# Patient Record
Sex: Female | Born: 1975 | Race: Black or African American | Hispanic: No | Marital: Single
Health system: Southern US, Community
[De-identification: ages and names within clinical notes are randomized; demographics above are authoritative.]

## PROBLEM LIST (undated history)

## (undated) DIAGNOSIS — I509 Heart failure, unspecified: Secondary | ICD-10-CM

## (undated) DIAGNOSIS — T783XXA Angioneurotic edema, initial encounter: Secondary | ICD-10-CM

## (undated) DIAGNOSIS — J449 Chronic obstructive pulmonary disease, unspecified: Secondary | ICD-10-CM

## (undated) DIAGNOSIS — G4730 Sleep apnea, unspecified: Secondary | ICD-10-CM

## (undated) DIAGNOSIS — E669 Obesity, unspecified: Secondary | ICD-10-CM

## (undated) DIAGNOSIS — R0600 Dyspnea, unspecified: Secondary | ICD-10-CM

## (undated) DIAGNOSIS — J40 Bronchitis, not specified as acute or chronic: Secondary | ICD-10-CM

## (undated) DIAGNOSIS — D649 Anemia, unspecified: Secondary | ICD-10-CM

## (undated) DIAGNOSIS — J45909 Unspecified asthma, uncomplicated: Secondary | ICD-10-CM

## (undated) HISTORY — PX: NO PAST SURGERIES: SHX2092

## (undated) HISTORY — DX: Angioneurotic edema, initial encounter: T78.3XXA

## (undated) HISTORY — DX: Anemia, unspecified: D64.9

## (undated) HISTORY — PX: DENTAL SURGERY: SHX609

---

## 2008-07-17 ENCOUNTER — Emergency Department (HOSPITAL_COMMUNITY): Admission: EM | Admit: 2008-07-17 | Discharge: 2008-07-17 | Payer: Self-pay | Admitting: Emergency Medicine

## 2008-12-19 ENCOUNTER — Ambulatory Visit: Payer: Self-pay | Admitting: Internal Medicine

## 2008-12-19 DIAGNOSIS — R030 Elevated blood-pressure reading, without diagnosis of hypertension: Secondary | ICD-10-CM

## 2008-12-20 ENCOUNTER — Telehealth (INDEPENDENT_AMBULATORY_CARE_PROVIDER_SITE_OTHER): Payer: Self-pay | Admitting: *Deleted

## 2008-12-20 LAB — CONVERTED CEMR LAB
ALT: 14 units/L (ref 0–35)
AST: 16 units/L (ref 0–37)
Albumin: 3.4 g/dL — ABNORMAL LOW (ref 3.5–5.2)
Alkaline Phosphatase: 92 units/L (ref 39–117)
Basophils Absolute: 0 10*3/uL (ref 0.0–0.1)
Basophils Relative: 0.2 % (ref 0.0–3.0)
Bilirubin Urine: NEGATIVE
Bilirubin, Direct: 0.1 mg/dL (ref 0.0–0.3)
CO2: 32 meq/L (ref 19–32)
Chloride: 102 meq/L (ref 96–112)
Eosinophils Absolute: 0.7 10*3/uL (ref 0.0–0.7)
Eosinophils Relative: 5.6 % — ABNORMAL HIGH (ref 0.0–5.0)
Glucose, Bld: 88 mg/dL (ref 70–99)
Hemoglobin: 11.4 g/dL — ABNORMAL LOW (ref 12.0–15.0)
Leukocytes, UA: NEGATIVE
Lymphs Abs: 2.7 10*3/uL (ref 0.7–4.0)
MCV: 77.5 fL — ABNORMAL LOW (ref 78.0–100.0)
Monocytes Absolute: 0.7 10*3/uL (ref 0.1–1.0)
Neutro Abs: 8.8 10*3/uL — ABNORMAL HIGH (ref 1.4–7.7)
Neutrophils Relative %: 67.2 % (ref 43.0–77.0)
Nitrite: NEGATIVE
RBC: 4.44 M/uL (ref 3.87–5.11)
Sodium: 140 meq/L (ref 135–145)
Total CHOL/HDL Ratio: 2
Total Protein, Urine: 30 mg/dL
VLDL: 10.4 mg/dL (ref 0.0–40.0)
WBC: 12.9 10*3/uL — ABNORMAL HIGH (ref 4.5–10.5)

## 2011-01-13 ENCOUNTER — Emergency Department (HOSPITAL_COMMUNITY)
Admission: EM | Admit: 2011-01-13 | Discharge: 2011-01-14 | Disposition: A | Discharge status: Home / Self Care | Payer: PRIVATE HEALTH INSURANCE | Attending: Emergency Medicine | Admitting: Emergency Medicine

## 2011-01-13 ENCOUNTER — Emergency Department (HOSPITAL_COMMUNITY): Payer: PRIVATE HEALTH INSURANCE

## 2011-01-13 DIAGNOSIS — R5381 Other malaise: Secondary | ICD-10-CM | POA: Insufficient documentation

## 2011-01-13 DIAGNOSIS — R0989 Other specified symptoms and signs involving the circulatory and respiratory systems: Secondary | ICD-10-CM | POA: Insufficient documentation

## 2011-01-13 DIAGNOSIS — N920 Excessive and frequent menstruation with regular cycle: Secondary | ICD-10-CM | POA: Insufficient documentation

## 2011-01-13 DIAGNOSIS — R0609 Other forms of dyspnea: Secondary | ICD-10-CM | POA: Insufficient documentation

## 2011-01-13 DIAGNOSIS — D649 Anemia, unspecified: Secondary | ICD-10-CM | POA: Insufficient documentation

## 2011-01-13 DIAGNOSIS — R079 Chest pain, unspecified: Secondary | ICD-10-CM | POA: Insufficient documentation

## 2011-01-13 LAB — COMPREHENSIVE METABOLIC PANEL
AST: 16 U/L (ref 0–37)
Albumin: 3 g/dL — ABNORMAL LOW (ref 3.5–5.2)
BUN: 9 mg/dL (ref 6–23)
Chloride: 104 mEq/L (ref 96–112)
GFR calc non Af Amer: >60 mL/min (ref >60)
Potassium: 3.6 mEq/L (ref 3.5–5.1)
Sodium: 137 mEq/L (ref 135–145)

## 2011-01-13 LAB — CBC
HCT: 21.6 % — ABNORMAL LOW (ref 36.0–46.0)
Platelets: 368 10*3/uL (ref 150–400)
RBC: 2.84 MIL/uL — ABNORMAL LOW (ref 3.87–5.11)
RDW: 18.2 % — ABNORMAL HIGH (ref 11.5–15.5)

## 2011-01-13 LAB — URINE MICROSCOPIC-ADD ON

## 2011-01-13 LAB — URINALYSIS, ROUTINE W REFLEX MICROSCOPIC
Glucose, UA: NEGATIVE mg/dL
Nitrite: NEGATIVE
Protein, ur: NEGATIVE mg/dL
Urobilinogen, UA: 1 mg/dL (ref 0.0–1.0)

## 2011-01-13 LAB — DIFFERENTIAL
Basophils Absolute: 0 10*3/uL (ref 0.0–0.1)
Eosinophils Relative: 3 % (ref 0–5)
Lymphs Abs: 3.9 10*3/uL (ref 0.7–4.0)
Monocytes Relative: 8 % (ref 3–12)
Neutro Abs: 7.8 10*3/uL — ABNORMAL HIGH (ref 1.7–7.7)

## 2011-01-13 LAB — POCT CARDIAC MARKERS
CKMB, poc: <1 ng/mL — ABNORMAL LOW (ref 1.0–8.0)
Myoglobin, poc: 39.9 ng/mL (ref 12–200)

## 2011-01-14 LAB — TSH: TSH: 1.372 u[IU]/mL (ref 0.350–4.500)

## 2011-01-25 LAB — URINE CULTURE: Culture  Setup Time: 201204190120

## 2012-11-14 ENCOUNTER — Ambulatory Visit: Payer: No Typology Code available for payment source | Admitting: Emergency Medicine

## 2012-11-14 VITALS — BP 135/78 | HR 87 | Temp 97.8°F | Resp 20 | Ht 61.5 in | Wt 397.0 lb

## 2012-11-14 DIAGNOSIS — R079 Chest pain, unspecified: Secondary | ICD-10-CM

## 2012-11-14 DIAGNOSIS — F17200 Nicotine dependence, unspecified, uncomplicated: Secondary | ICD-10-CM

## 2012-11-14 DIAGNOSIS — J209 Acute bronchitis, unspecified: Secondary | ICD-10-CM

## 2012-11-14 DIAGNOSIS — Z720 Tobacco use: Secondary | ICD-10-CM

## 2012-11-14 LAB — POCT CBC
HCT, POC: 39 % (ref 37.7–47.9)
MCHC: 29.5 g/dL — AB (ref 31.8–35.4)
MCV: 82.2 fL (ref 80–97)
MPV: 7.9 fL (ref 0–99.8)
POC LYMPH PERCENT: 28.6 %L (ref 10–50)
RBC: 4.7 M/uL (ref 4.04–5.48)
WBC: 10.7 10*3/uL — AB (ref 4.6–10.2)

## 2012-11-14 LAB — COMPREHENSIVE METABOLIC PANEL
Chloride: 103 mEq/L (ref 96–112)
Creat: 0.56 mg/dL (ref 0.50–1.10)
Glucose, Bld: 65 mg/dL — ABNORMAL LOW (ref 70–99)
Potassium: 4 mEq/L (ref 3.5–5.3)

## 2012-11-14 MED ORDER — ALBUTEROL SULFATE HFA 108 (90 BASE) MCG/ACT IN AERS: 2.0000 | INHALATION_SPRAY | RESPIRATORY_TRACT | Status: DC | PRN

## 2012-11-14 NOTE — Patient Instructions (Addendum)
Smoking Cessation Quitting smoking is important to your health and has many advantages. However, it is not always easy to quit since nicotine is a very addictive drug. Often times, people try 3 times or more before being able to quit. This document explains the best ways for you to prepare to quit smoking. Quitting takes hard work and a lot of effort, but you can do it. ADVANTAGES OF QUITTING SMOKING  You will live longer, feel better, and live better.  Your body will feel the impact of quitting smoking almost immediately.  Within 20 minutes, blood pressure decreases. Your pulse returns to its normal level.  After 8 hours, carbon monoxide levels in the blood return to normal. Your oxygen level increases.  After 24 hours, the chance of having a heart attack starts to decrease. Your breath, hair, and body stop smelling like smoke.  After 48 hours, damaged nerve endings begin to recover. Your sense of taste and smell improve.  After 72 hours, the body is virtually free of nicotine. Your bronchial tubes relax and breathing becomes easier.  After 2 to 12 weeks, lungs can hold more air. Exercise becomes easier and circulation improves.  The risk of having a heart attack, stroke, cancer, or lung disease is greatly reduced.  After 1 year, the risk of coronary heart disease is cut in half.  After 5 years, the risk of stroke falls to the same as a nonsmoker.  After 10 years, the risk of lung cancer is cut in half and the risk of other cancers decreases significantly.  After 15 years, the risk of coronary heart disease drops, usually to the level of a nonsmoker.  If you are pregnant, quitting smoking will improve your chances of having a healthy baby.  The people you live with, especially any children, will be healthier.  You will have extra money to spend on things other than cigarettes. QUESTIONS TO THINK ABOUT BEFORE ATTEMPTING TO QUIT You may want to talk about your answers with your  caregiver.  Why do you want to quit?  If you tried to quit in the past, what helped and what did not?  What will be the most difficult situations for you after you quit? How will you plan to handle them?  Who can help you through the tough times? Your family? Friends? A caregiver?  What pleasures do you get from smoking? What ways can you still get pleasure if you quit? Here are some questions to ask your caregiver:  How can you help me to be successful at quitting?  What medicine do you think would be best for me and how should I take it?  What should I do if I need more help?  What is smoking withdrawal like? How can I get information on withdrawal? GET READY  Set a quit date.  Change your environment by getting rid of all cigarettes, ashtrays, matches, and lighters in your home, car, or work. Do not let people smoke in your home.  Review your past attempts to quit. Think about what worked and what did not. GET SUPPORT AND ENCOURAGEMENT You have a better chance of being successful if you have help. You can get support in many ways.  Tell your family, friends, and co-workers that you are going to quit and need their support. Ask them not to smoke around you.  Get individual, group, or telephone counseling and support. Programs are available at local hospitals and health centers. Call your local health department for   information about programs in your area.  Spiritual beliefs and practices may help some smokers quit.  Download a "quit meter" on your computer to keep track of quit statistics, such as how long you have gone without smoking, cigarettes not smoked, and money saved.  Get a self-help book about quitting smoking and staying off of tobacco. LEARN NEW SKILLS AND BEHAVIORS  Distract yourself from urges to smoke. Talk to someone, go for a walk, or occupy your time with a task.  Change your normal routine. Take a different route to work. Drink tea instead of coffee.  Eat breakfast in a different place.  Reduce your stress. Take a hot bath, exercise, or read a book.  Plan something enjoyable to do every day. Reward yourself for not smoking.  Explore interactive web-based programs that specialize in helping you quit. GET MEDICINE AND USE IT CORRECTLY Medicines can help you stop smoking and decrease the urge to smoke. Combining medicine with the above behavioral methods and support can greatly increase your chances of successfully quitting smoking.  Nicotine replacement therapy helps deliver nicotine to your body without the negative effects and risks of smoking. Nicotine replacement therapy includes nicotine gum, lozenges, inhalers, nasal sprays, and skin patches. Some may be available over-the-counter and others require a prescription.  Antidepressant medicine helps people abstain from smoking, but how this works is unknown. This medicine is available by prescription.  Nicotinic receptor partial agonist medicine simulates the effect of nicotine in your brain. This medicine is available by prescription. Ask your caregiver for advice about which medicines to use and how to use them based on your health history. Your caregiver will tell you what side effects to look out for if you choose to be on a medicine or therapy. Carefully read the information on the package. Do not use any other product containing nicotine while using a nicotine replacement product.  RELAPSE OR DIFFICULT SITUATIONS Most relapses occur within the first 3 months after quitting. Do not be discouraged if you start smoking again. Remember, most people try several times before finally quitting. You may have symptoms of withdrawal because your body is used to nicotine. You may crave cigarettes, be irritable, feel very hungry, cough often, get headaches, or have difficulty concentrating. The withdrawal symptoms are only temporary. They are strongest when you first quit, but they will go away within  10 14 days. To reduce the chances of relapse, try to:  Avoid drinking alcohol. Drinking lowers your chances of successfully quitting.  Reduce the amount of caffeine you consume. Once you quit smoking, the amount of caffeine in your body increases and can give you symptoms, such as a rapid heartbeat, sweating, and anxiety.  Avoid smokers because they can make you want to smoke.  Do not let weight gain distract you. Many smokers will gain weight when they quit, usually less than 10 pounds. Eat a healthy diet and stay active. You can always lose the weight gained after you quit.  Find ways to improve your mood other than smoking. FOR MORE INFORMATION  www.smokefree.gov  Document Released: 09/07/2001 Document Revised: 03/14/2012 Document Reviewed: 12/23/2011 [employer] Patient Information 2013 [employer].  

## 2012-11-14 NOTE — Progress Notes (Signed)
[facility] [address], [place] [address] [phone]  Date:  11/14/2012   Name:  April Ellis   DOB:  06/07/1976   MRN:  [id number]  PCP:  Oliver Barre, MD    Chief Complaint: Chest Pain, Shortness of Breath and Labs Only d   History of Present Illness:  April Ellis is a 37 y.o. very pleasant female patient who presents with the following:  developed chest tightness last night while at rest. Tightness persisted through the night and less now but continuous.   No tachycardia or palpitations.  No  shortness of breath.  No nausea or vomiting.  Pain not radiating.  No diaphoresis.  Smokes.  No history of prior chest pain.  No family history of premature cardiac disease. Felt last night as though she was coming down with a "cold" with rhinorrhea and a non productive cough.  Some wheezing.  No fever or chills.    Patient Active Problem List  Diagnosis  . MORBID OBESITY  . ELEVATED BLOOD PRESSURE WITHOUT DIAGNOSIS OF HYPERTENSION    Past Medical History  Diagnosis Date  . Anemia     History reviewed. No pertinent past surgical history.  History  Substance Use Topics  . Smoking status: Current Every Day Smoker  . Smokeless tobacco: Not on file  . Alcohol Use: No    Family History  Problem Relation Age of Onset  . Diabetes Mother   . Diabetes Sister   . Hypertension Sister   . Hypertension Sister     No Known Allergies  Medication list has been reviewed and updated.  No current outpatient prescriptions on file prior to visit.   No current facility-administered medications on file prior to visit.    Review of Systems:  As per HPI, otherwise negative.    Physical Examination: Filed Vitals:   11/14/12 1603  BP: 135/78  Pulse: 87  Temp: 97.8 F (36.6 C)  Resp: 20   Filed Vitals:   11/14/12 1603  Height: 5' 1.5" (1.562 m)  Weight: 397 lb (180.078 kg)   Body mass index is 73.81 kg/(m^2). Ideal Body Weight: Weight in (lb) to  have BMI = 25: 134.2  GEN: morbidly obese, NAD, Non-toxic, A & O x 3 HEENT: Atraumatic, Normocephalic. Neck supple. No masses, No LAD. Ears and Nose: No external deformity. CV: RRR, No M/G/R. No JVD. No thrill. No extra heart sounds. PULM: CTA B, scattered wheezes, no crackles, rhonchi. No retractions. No resp. distress. No accessory muscle use. ABD: S, NT, ND, +BS. No rebound. No HSM. EXTR: No c/c/e  No calf tenderness NEURO Normal gait.  PSYCH: Normally interactive. Conversant. Not depressed or anxious appearing.  Calm demeanor.    Assessment and Plan: Chest pain Acute URI Bronchitis with Bronchospasm Albuterol MDI Counseled to lose weight and stop smoking.  Carmelina Dane, MD

## 2012-11-15 LAB — TROPONIN I: Troponin I: <0.01 ng/mL (ref <0.06)

## 2012-12-08 ENCOUNTER — Emergency Department (HOSPITAL_COMMUNITY)
Admission: EM | Admit: 2012-12-08 | Discharge: 2012-12-08 | Disposition: A | Discharge status: Home / Self Care | Payer: No Typology Code available for payment source | Attending: Emergency Medicine | Admitting: Emergency Medicine

## 2012-12-08 ENCOUNTER — Emergency Department (HOSPITAL_COMMUNITY): Payer: No Typology Code available for payment source

## 2012-12-08 ENCOUNTER — Encounter (HOSPITAL_COMMUNITY): Payer: Self-pay | Admitting: Emergency Medicine

## 2012-12-08 DIAGNOSIS — R0602 Shortness of breath: Secondary | ICD-10-CM | POA: Insufficient documentation

## 2012-12-08 DIAGNOSIS — IMO0002 Reserved for concepts with insufficient information to code with codable children: Secondary | ICD-10-CM | POA: Insufficient documentation

## 2012-12-08 DIAGNOSIS — Z79899 Other long term (current) drug therapy: Secondary | ICD-10-CM | POA: Insufficient documentation

## 2012-12-08 DIAGNOSIS — R059 Cough, unspecified: Secondary | ICD-10-CM | POA: Insufficient documentation

## 2012-12-08 DIAGNOSIS — J45901 Unspecified asthma with (acute) exacerbation: Secondary | ICD-10-CM | POA: Insufficient documentation

## 2012-12-08 DIAGNOSIS — F17200 Nicotine dependence, unspecified, uncomplicated: Secondary | ICD-10-CM | POA: Insufficient documentation

## 2012-12-08 DIAGNOSIS — R0609 Other forms of dyspnea: Secondary | ICD-10-CM | POA: Insufficient documentation

## 2012-12-08 DIAGNOSIS — Z862 Personal history of diseases of the blood and blood-forming organs and certain disorders involving the immune mechanism: Secondary | ICD-10-CM | POA: Insufficient documentation

## 2012-12-08 DIAGNOSIS — R0989 Other specified symptoms and signs involving the circulatory and respiratory systems: Secondary | ICD-10-CM | POA: Insufficient documentation

## 2012-12-08 HISTORY — DX: Bronchitis, not specified as acute or chronic: J40

## 2012-12-08 MED ORDER — ALBUTEROL SULFATE HFA 108 (90 BASE) MCG/ACT IN AERS
2.0000 | INHALATION_SPRAY | RESPIRATORY_TRACT | Status: DC | PRN
  Administered 2012-12-08: 2 via RESPIRATORY_TRACT
  Filled 2012-12-08: qty 6.7

## 2012-12-08 MED ORDER — IPRATROPIUM BROMIDE 0.02 % IN SOLN
0.5000 mg | Freq: Once | RESPIRATORY_TRACT | Status: AC
  Administered 2012-12-08: 0.5 mg via RESPIRATORY_TRACT
  Filled 2012-12-08: qty 2.5

## 2012-12-08 MED ORDER — DEXAMETHASONE SODIUM PHOSPHATE 10 MG/ML IJ SOLN
10.0000 mg | Freq: Once | INTRAMUSCULAR | Status: AC
  Administered 2012-12-08: 10 mg via INTRAMUSCULAR
  Filled 2012-12-08: qty 1

## 2012-12-08 MED ORDER — PREDNISONE 10 MG PO TABS: 50.0000 mg | ORAL_TABLET | Freq: Every day | ORAL | Status: DC

## 2012-12-08 MED ORDER — ALBUTEROL SULFATE (5 MG/ML) 0.5% IN NEBU
5.0000 mg | INHALATION_SOLUTION | Freq: Once | RESPIRATORY_TRACT | Status: AC
  Administered 2012-12-08: 5 mg via RESPIRATORY_TRACT
  Filled 2012-12-08: qty 1

## 2012-12-08 MED ORDER — DEXAMETHASONE SODIUM PHOSPHATE 10 MG/ML IJ SOLN: 10.0000 mg | Freq: Once | INTRAMUSCULAR | Status: DC

## 2012-12-08 NOTE — ED Provider Notes (Signed)
History     CSN: [id number]  Arrival date & time 12/08/12  0804   First MD Initiated Contact with Patient 12/08/12 0820      Chief Complaint  Patient presents with  . Nasal Congestion     HPI Pt presenting to ed with c/o cough, congestion and shortness of breath x couple of days. Pt states positive shortness of breath. Pt denies chest pain, nausea, and vomiting at this time. Pt states her inhalers are not working. Pt with audible wheezing noted  Past Medical History  Diagnosis Date  . Anemia   . Bronchitis     History reviewed. No pertinent past surgical history.  Family History  Problem Relation Age of Onset  . Diabetes Mother   . Diabetes Sister   . Hypertension Sister   . Hypertension Sister     History  Substance Use Topics  . Smoking status: Current Every Day Smoker  . Smokeless tobacco: Not on file  . Alcohol Use: No    OB History   Grav Para Term Preterm Abortions TAB SAB Ect Mult Living                  Review of Systems All other systems reviewed and are negative Allergies  Review of patient's allergies indicates no known allergies.  Home Medications   Current Outpatient Rx  Name  Route  Sig  Dispense  Refill  . albuterol (PROVENTIL HFA;VENTOLIN HFA) 108 (90 BASE) MCG/ACT inhaler   Inhalation   Inhale 2 puffs into the lungs every 4 (four) hours as needed for wheezing (cough, shortness of breath or wheezing.).         Marland Kitchen aspirin 325 MG tablet   Oral   Take 325-650 mg by mouth every 4 (four) hours as needed for pain.         . predniSONE (DELTASONE) 10 MG tablet   Oral   Take 5 tablets (50 mg total) by mouth daily. Pill prescription and begin taking of no improvement in 24 hours   15 tablet   0     BP 176/94  Pulse 101  Temp(Src) 98.7 F (37.1 C) (Oral)  Resp 20  SpO2 95%  LMP 11/11/2012  Physical Exam  Nursing note and vitals reviewed. Constitutional: She is oriented to person, place, and time. She appears well-developed and  well-nourished. No distress.  HENT:  Head: Normocephalic and atraumatic.  Eyes: Pupils are equal, round, and reactive to light.  Neck: Normal range of motion.  Cardiovascular: Normal rate and intact distal pulses.   Pulmonary/Chest: She is in respiratory distress (Mild). She has wheezes. She has no rales. She exhibits no tenderness.  Abdominal: Normal appearance. She exhibits no distension. There is no tenderness.  Musculoskeletal: Normal range of motion.  Neurological: She is alert and oriented to person, place, and time. No cranial nerve deficit.  Skin: Skin is warm and dry. No rash noted.  Psychiatric: She has a normal mood and affect. Her behavior is normal.    ED Course  Procedures (including critical care time) Meds ordered this encounter  Medications  . albuterol (PROVENTIL) (5 MG/ML) 0.5% nebulizer solution 5 mg    Sig:   . ipratropium (ATROVENT) nebulizer solution 0.5 mg    Sig:   . DISCONTD: dexamethasone (DECADRON) injection 10 mg    Sig:   . aspirin 325 MG tablet    Sig: Take 325-650 mg by mouth every 4 (four) hours as needed for pain.  Marland Kitchen albuterol (  PROVENTIL HFA;VENTOLIN HFA) 108 (90 BASE) MCG/ACT inhaler    Sig: Inhale 2 puffs into the lungs every 4 (four) hours as needed for wheezing (cough, shortness of breath or wheezing.).  Marland Kitchen dexamethasone (DECADRON) injection 10 mg    Sig:   . albuterol (PROVENTIL) (5 MG/ML) 0.5% nebulizer solution 5 mg    Sig:   . ipratropium (ATROVENT) nebulizer solution 0.5 mg    Sig:   . predniSONE (DELTASONE) 10 MG tablet    Sig: Take 5 tablets (50 mg total) by mouth daily. Pill prescription and begin taking of no improvement in 24 hours    Dispense:  15 tablet    Refill:  0  . albuterol (PROVENTIL HFA;VENTOLIN HFA) 108 (90 BASE) MCG/ACT inhaler 2 puff    Sig:     Labs Reviewed - No data to display Dg Chest 2 View (if Patient Has Fever And/or Copd)  12/08/2012  *RADIOLOGY REPORT*  Clinical Data: Shortness of breath.  Cough.  Tobacco  use.  CHEST - 2 VIEW  Comparison: None.  Findings: Mild cardiomegaly noted along with mild airway thickening.  The lungs appear otherwise clear.  No pleural effusion observed.  IMPRESSION:  1.  Mild cardiomegaly. 2. Airway thickening may reflect bronchitis or reactive airways disease.  No airspace opacity is identified to suggest bacterial pneumonia pattern.   Original Report Authenticated By: Gaylyn Rong, M.D.      1. Asthmatic bronchitis       MDM  Patient is feeling better after treatment and doesn't want to be admitted.  Her O2 saturations are in the mid to low 90s as long as she is at rest.  Patient stable for discharge stay she feels better.        Nelia Shi, MD 12/08/12 [phone]

## 2012-12-08 NOTE — ED Notes (Signed)
Patient transported to X-ray 

## 2012-12-08 NOTE — ED Notes (Signed)
Pt amputated oxygen 82%, heart rate 117 nurse aware

## 2012-12-08 NOTE — ED Notes (Signed)
Pt presenting to ed with c/o cough, congestion and shortness of breath x couple of days. Pt states positive shortness of breath. Pt denies chest pain, nausea, and vomiting at this time. Pt states her inhalers are not working. Pt with audible wheezing noted

## 2013-09-27 LAB — HM PAP SMEAR: HM PAP: NORMAL

## 2014-03-04 ENCOUNTER — Ambulatory Visit (INDEPENDENT_AMBULATORY_CARE_PROVIDER_SITE_OTHER): Payer: No Typology Code available for payment source | Admitting: Emergency Medicine

## 2014-03-04 VITALS — BP 124/74 | HR 103 | Temp 97.9°F | Resp 20 | Ht 62.0 in | Wt 356.0 lb

## 2014-03-04 DIAGNOSIS — R0602 Shortness of breath: Secondary | ICD-10-CM

## 2014-03-04 DIAGNOSIS — J209 Acute bronchitis, unspecified: Secondary | ICD-10-CM

## 2014-03-04 MED ORDER — ALBUTEROL SULFATE (2.5 MG/3ML) 0.083% IN NEBU
5.0000 mg | INHALATION_SOLUTION | Freq: Once | RESPIRATORY_TRACT | Status: AC
  Administered 2014-03-04: 5 mg via RESPIRATORY_TRACT

## 2014-03-04 MED ORDER — ALBUTEROL SULFATE HFA 108 (90 BASE) MCG/ACT IN AERS: 2.0000 | INHALATION_SPRAY | RESPIRATORY_TRACT | Status: DC | PRN

## 2014-03-04 MED ORDER — IPRATROPIUM BROMIDE 0.02 % IN SOLN
0.5000 mg | Freq: Once | RESPIRATORY_TRACT | Status: AC
  Administered 2014-03-04: 0.5 mg via RESPIRATORY_TRACT

## 2014-03-04 NOTE — Patient Instructions (Signed)
Metered Dose Inhaler (No Spacer Used) Inhaled medicines are the basis of asthma treatment and other breathing problems. Inhaled medicine can only be effective if used properly. Good technique assures that the medicine reaches the lungs. Metered dose inhalers (MDIs) are used to deliver a variety of inhaled medicines. These include quick relief or rescue medicines (such as bronchodilators) and controller medicines (such as corticosteroids). The medicine is delivered by pushing down on a metal canister to release a set amount of spray. If you are using different kinds of inhalers, use your quick relief medicine to open the airways 10 15 minutes before using a steroid if instructed to do so by your health care provider. If you are unsure which inhalers to use and the order of using them, ask your health care provider, nurse, or respiratory therapist. HOW TO USE THE INHALER 1. Remove cap from inhaler. 2. If you are using the inhaler for the first time, you will need to prime it. Shake the inhaler for 5 seconds and release four puffs into the air, away from your face. Ask your health care provider or pharmacist if you have questions about priming your inhaler. 3. Shake inhaler for 5 seconds before each breath in (inhalation). 4. Position the inhaler so that the top of the canister faces up. 5. Put your index finger on the top of the medicine canister. Your thumb supports the bottom of the inhaler. 6. Open your mouth. 7. Either place the inhaler between your teeth and place your lips tightly around the mouthpiece, or hold the inhaler 1 2 inches away from your open mouth. If you are unsure of which technique to use, ask your health care provider. 8. Breathe out (exhale) normally and as completely as possible. 9. Press the canister down with the index finger to release the medicine. 10. At the same time as the canister is pressed, inhale deeply and slowly until the lungs are completely filled. This should take  4 6 seconds. Keep your tongue down. 11. Hold the medicine in your lungs for up to 5 10 seconds (10 seconds is best). This helps the medicine get into the small airways of your lungs. 12. Breathe out slowly, through pursed lips. Whistling is an example of pursed lips. 13. Wait at least 1 minute between puffs. Continue with the above steps until you have taken the number of puffs your health care provider has ordered. Do not use the inhaler more than your health care provider directs you to. 14. Replace cap on inhaler. 15. Follow the directions from your health care provider or the inhaler insert for cleaning the inhaler. If you are using a steroid inhaler, rinse your mouth with water after your last puff, gargle, and spit out the water. Do not swallow the water. AVOID:  Inhaling before or after starting the spray of medicine. It takes practice to coordinate your breathing with triggering the spray.  Inhaling through the nose (rather than the mouth) when triggering the spray. HOW TO DETERMINE IF YOUR INHALER IS FULL OR NEARLY EMPTY You cannot know when an inhaler is empty by shaking it. A few inhalers are now being made with dose counters. Ask your health care provider for a prescription that has a dose counter if you feel you need that extra help. If your inhaler does not have a counter, ask your health care provider to help you determine the date you need to refill your inhaler. Write the refill date on a calendar or your inhaler canister.  Refill your inhaler 7 10 days before it runs out. Be sure to keep an adequate supply of medicine. This includes making sure it is not expired, and you have a spare inhaler.  SEEK MEDICAL CARE IF:   Symptoms are only partially relieved with your inhaler.  You are having trouble using your inhaler.  You experience some increase in phlegm. SEEK IMMEDIATE MEDICAL CARE IF:   You feel little or no relief with your inhalers. You are still wheezing and are feeling  shortness of breath or tightness in your chest or both.  You have dizziness, headaches, or fast heart rate.  You have chills, fever, or night sweats.  There is a noticeable increase in phlegm production, or there is blood in the phlegm. Document Released: 07/11/2007 Document Revised: 05/16/2013 Document Reviewed: 03/01/2013 [facility] Patient Information 2014 [place], [place]. Bronchospasm, Adult A bronchospasm is a spasm or tightening of the airways going into the lungs. During a bronchospasm breathing becomes more difficult because the airways get smaller. When this happens there can be coughing, a whistling sound when breathing (wheezing), and difficulty breathing. Bronchospasm is often associated with asthma, but not all patients who experience a bronchospasm have asthma. CAUSES  A bronchospasm is caused by inflammation or irritation of the airways. The inflammation or irritation may be triggered by:   Allergies (such as to animals, pollen, food, or mold). Allergens that cause bronchospasm may cause wheezing immediately after exposure or many hours later.   Infection. Viral infections are believed to be the most common cause of bronchospasm.   Exercise.   Irritants (such as pollution, cigarette smoke, strong odors, aerosol sprays, and paint fumes).   Weather changes. Winds increase molds and pollens in the air. Rain refreshes the air by washing irritants out. Cold air may cause inflammation.   Stress and emotional upset.  SIGNS AND SYMPTOMS   Wheezing.   Excessive nighttime coughing.   Frequent or severe coughing with a simple cold.   Chest tightness.   Shortness of breath.  DIAGNOSIS  Bronchospasm is usually diagnosed through a history and physical exam. Tests, such as chest X-rays, are sometimes done to look for other conditions. TREATMENT   Inhaled medicines can be given to open up your airways and help you breathe. The medicines can be given using either an  inhaler or a nebulizer machine.  Corticosteroid medicines may be given for severe bronchospasm, usually when it is associated with asthma. HOME CARE INSTRUCTIONS   Always have a plan prepared for seeking medical care. Know when to call your health care provider and local emergency services (911 in the [place] Know where you can access local emergency care.  Only take medicines as directed by your health care provider.  If you were prescribed an inhaler or nebulizer machine, ask your health care provider to explain how to use it correctly. Always use a spacer with your inhaler if you were given one.  It is necessary to remain calm during an attack. Try to relax and breathe more slowly.  Control your home environment in the following ways:   Change your heating and air conditioning filter at least once a month.   Limit your use of fireplaces and wood stoves.  Do not smoke and do not allow smoking in your home.   Avoid exposure to perfumes and fragrances.   Get rid of pests (such as roaches and mice) and their droppings.   Throw away plants if you see mold on them.   Keep  your house clean and dust free.   Replace carpet with wood, tile, or vinyl flooring. Carpet can trap dander and dust.   Use allergy-proof pillows, mattress covers, and box spring covers.   Wash bed sheets and blankets every week in hot water and dry them in a dryer.   Use blankets that are made of polyester or cotton.   Wash hands frequently. SEEK MEDICAL CARE IF:   You have muscle aches.   You have chest pain.   The sputum changes from clear or white to yellow, green, gray, or bloody.   The sputum you cough up gets thicker.   There are problems that may be related to the medicine you are given, such as a rash, itching, swelling, or trouble breathing.  SEEK IMMEDIATE MEDICAL CARE IF:   You have worsening wheezing and coughing even after taking your prescribed medicines.   You have  increased difficulty breathing.   You develop severe chest pain. MAKE SURE YOU:   Understand these instructions.  Will watch your condition.  Will get help right away if you are not doing well or get worse. Document Released: 09/16/2003 Document Revised: 05/16/2013 Document Reviewed: 03/05/2013 [facility] Patient Information 2014 [place].

## 2014-03-04 NOTE — Progress Notes (Signed)
[facility] [address] [place] [address] [phone]  Date:  03/04/2014   Name:  April Ellis   DOB:  08-06-1976   MRN:  [id number]  PCP:  [name], MD    Chief Complaint: Cough and Shortness of Breath   History of Present Illness:  April Ellis is a 38 y.o. very pleasant female patient who presents with the following:  Patient with history of bronchitis.  Now has shortness of breath that has been increasing.  Cough that is productive of scant mucoid sputum.  No wheezing with exertional shortness of breath. No fever or chills.  No rash, stool change, nausea or vomiting. Has used nebs and MDI previously for bronchitis No improvement with over the counter medications or other home remedies. Denies other complaint or health concern today.   Patient Active Problem List   Diagnosis Date Noted  . MORBID OBESITY 12/19/2008  . ELEVATED BLOOD PRESSURE WITHOUT DIAGNOSIS OF HYPERTENSION 12/19/2008    Past Medical History  Diagnosis Date  . Anemia   . Bronchitis     No past surgical history on file.  History  Substance Use Topics  . Smoking status: Current Every Day Smoker  . Smokeless tobacco: Not on file  . Alcohol Use: No    Family History  Problem Relation Age of Onset  . Diabetes Mother   . Diabetes Sister   . Hypertension Sister   . Hypertension Sister     No Known Allergies  Medication list has been reviewed and updated.  No current outpatient prescriptions on file prior to visit.   No current facility-administered medications on file prior to visit.    Review of Systems:  As per HPI, otherwise negative.    Physical Examination: Filed Vitals:   03/04/14 1957  BP: 124/74  Pulse: 103  Temp: 97.9 F (36.6 C)  Resp: 20   Filed Vitals:   03/04/14 1957  Height: 5\' 2"  (1.575 m)  Weight: 356 lb (161.481 kg)   Body mass index is 65.1 kg/(m^2). Ideal Body Weight: Weight in (lb) to have BMI = 25: 136.4  GEN: morbid  obesity, NAD, Non-toxic, A & O x 3 HEENT: Atraumatic, Normocephalic. Neck supple. No masses, No LAD. Ears and Nose: No external deformity. CV: RRR, No M/G/R. No JVD. No thrill. No extra heart sounds. PULM: poor air movement Bilateral wheezes, crackles, rhonchi. No retractions. No resp. distress. No accessory muscle use. ABD: S, NT, ND, +BS. No rebound. No HSM. EXTR: No c/c/e NEURO Normal gait.  PSYCH: Normally interactive. Conversant. Not depressed or anxious appearing.  Calm demeanor.    Assessment and Plan: Migraine headache Phenergan Nubain   Signed,  Ellison Carwin, MD

## 2014-03-06 ENCOUNTER — Other Ambulatory Visit: Payer: Self-pay

## 2014-03-06 ENCOUNTER — Telehealth: Payer: Self-pay

## 2014-03-06 ENCOUNTER — Ambulatory Visit (INDEPENDENT_AMBULATORY_CARE_PROVIDER_SITE_OTHER): Payer: No Typology Code available for payment source

## 2014-03-06 ENCOUNTER — Ambulatory Visit (INDEPENDENT_AMBULATORY_CARE_PROVIDER_SITE_OTHER): Payer: No Typology Code available for payment source | Admitting: Family Medicine

## 2014-03-06 VITALS — BP 116/77 | HR 96 | Temp 98.3°F | Resp 20 | Ht 62.0 in | Wt 356.0 lb

## 2014-03-06 DIAGNOSIS — R0602 Shortness of breath: Secondary | ICD-10-CM

## 2014-03-06 DIAGNOSIS — R0902 Hypoxemia: Secondary | ICD-10-CM

## 2014-03-06 DIAGNOSIS — J45901 Unspecified asthma with (acute) exacerbation: Secondary | ICD-10-CM

## 2014-03-06 DIAGNOSIS — F17200 Nicotine dependence, unspecified, uncomplicated: Secondary | ICD-10-CM

## 2014-03-06 DIAGNOSIS — J441 Chronic obstructive pulmonary disease with (acute) exacerbation: Secondary | ICD-10-CM

## 2014-03-06 LAB — POCT CBC
Granulocyte percent: 73.4 %G (ref 37–80)
HCT, POC: 33.9 % — AB (ref 37.7–47.9)
Hemoglobin: 9.5 g/dL — AB (ref 12.2–16.2)
LYMPH, POC: 2 (ref 0.6–3.4)
MCH: 19.2 pg — AB (ref 27–31.2)
MCHC: 28 g/dL — AB (ref 31.8–35.4)
MCV: 68.3 fL — AB (ref 80–97)
MID (CBC): 1.1 — AB (ref 0–0.9)
MPV: 7.9 fL (ref 0–99.8)
PLATELET COUNT, POC: 418 10*3/uL (ref 142–424)
POC Granulocyte: 8.4 — AB (ref 2–6.9)
POC LYMPH %: 17.1 % (ref 10–50)
POC MID %: 9.5 % (ref 0–12)
RBC: 4.96 M/uL (ref 4.04–5.48)
RDW, POC: 22.1 %
WBC: 11.5 10*3/uL — AB (ref 4.6–10.2)

## 2014-03-06 LAB — GLUCOSE, POCT (MANUAL RESULT ENTRY): POC Glucose: 91 mg/dl (ref 70–99)

## 2014-03-06 MED ORDER — ALBUTEROL SULFATE (2.5 MG/3ML) 0.083% IN NEBU: 2.5000 mg | INHALATION_SOLUTION | Freq: Four times a day (QID) | RESPIRATORY_TRACT | Status: DC | PRN

## 2014-03-06 MED ORDER — ALBUTEROL SULFATE HFA 108 (90 BASE) MCG/ACT IN AERS: 2.0000 | INHALATION_SPRAY | Freq: Four times a day (QID) | RESPIRATORY_TRACT | Status: DC | PRN

## 2014-03-06 MED ORDER — IPRATROPIUM BROMIDE 0.02 % IN SOLN
0.5000 mg | Freq: Once | RESPIRATORY_TRACT | Status: AC
  Administered 2014-03-06: 0.5 mg via RESPIRATORY_TRACT

## 2014-03-06 MED ORDER — ALBUTEROL SULFATE (2.5 MG/3ML) 0.083% IN NEBU
2.5000 mg | INHALATION_SOLUTION | Freq: Once | RESPIRATORY_TRACT | Status: AC
  Administered 2014-03-06: 2.5 mg via RESPIRATORY_TRACT

## 2014-03-06 MED ORDER — PREDNISONE 20 MG PO TABS: ORAL_TABLET | ORAL | Status: DC

## 2014-03-06 MED ORDER — BECLOMETHASONE DIPROPIONATE 80 MCG/ACT IN AERS: 2.0000 | INHALATION_SPRAY | Freq: Two times a day (BID) | RESPIRATORY_TRACT | Status: DC

## 2014-03-06 MED ORDER — METHYLPREDNISOLONE SODIUM SUCC 125 MG IJ SOLR
125.0000 mg | Freq: Once | INTRAMUSCULAR | Status: AC
  Administered 2014-03-06: 125 mg via INTRAMUSCULAR

## 2014-03-06 NOTE — Progress Notes (Signed)
Subjective: I was asked to see this patient acutely. She was here 2 days ago with some breathing difficulty. She has been using her albuterol inhaler sometimes 4 puffs every 2 hours or so at home. She has been feeling more short of breath. She has very little exertional capability. She continues to smoke some cigarettes that she is cutting back. A pack lasts about 2 days. She's not coughing up any phlegm. She is not running a fever. She is not having any chest pains or palpitations. She apparently works a job. She cannot ambulate without her oxygen dropping low.  Objective: Morbidly obese lady, wheezing. Her chest has prolonged expiratory phase and wheezing bilaterally. Throat was clear. Neck supple without nodes. Heart regular without murmurs gallops or arrhythmias. No edema. As noted she is very very obese. Pulse ox is 91 or 92 at rest, but walking down the hall to the exam room just 20 feet and her oxygen level was 87.  Nebulizer treatment of albuterol and Atrovent was given her and her O2 saturation was around 93.  [facility] reading (PRIMARY) by  Dr. Linna Darner Normal chest  Peak flow could not be registered initially. After the nebulized treatment it was about 140.  Repeat exam of the chest revealed only minimal end expiratory wheezing. She felt much better. She was slowing time.  She is given another nebulizer treatment with just plain albuterol. She was feeling better though the peak flow not substantially improved. Minimal end expiratory wheeze still. She states she feels much better.  Assessment: Asthma exacerbation COPD exacerbation Tobacco use disorder Morbid obesity Hypoxia, much worse on minimal exertion  Plan: The nebulizer machine. I believe that she is moving so little air that she gets practically none in from her inhaler. Injection of Solu-Medrol 125 and prednisone taper at home Go to the emergency room if in all worse Spent about 45 min of my time but patient treated over the  span of 90 minutes.   Return in 3 days, referral is being made to pulmonologist. Informed her that if she doesn't stop smoking this could kill her.

## 2014-03-06 NOTE — Telephone Encounter (Signed)
Pt called and stated we had sent her albuterol and pred Rxs to [facility] and should have gone to [facility]. I resent them to [facility] and cancelled at [facility].

## 2014-03-06 NOTE — Patient Instructions (Addendum)
Use the albuterol nebulizer every 4-6 hours as needed. Use either it or the inhaler, not both at the same time.  Beginning tomorrow morning take the prednisone 3 tablets daily for 2 days, then daily for 2 days, then one daily for 2 days  Stop smoking immediately  Referral is being made to a lung specialist. If you do not hear from our office within the next week regarding that referral, please call back and speak to the referral desk.  Return in 3 days for a recheck. Return sooner if any time if worse. If abruptly worse come to the emergency room.  Use the Qvar inhaler 2 inhalations twice daily

## 2014-03-06 NOTE — Telephone Encounter (Signed)
Pt called. Said we hand wrote a prescription for a nebulizer machine, but we didn't give her a prescription for the albuterol nebules. Dr. Linna Darner, do you mind printing a rx for these and I'll fax it to the number below. Thanks   [facility] Fax #: [phone]

## 2014-03-06 NOTE — Addendum Note (Signed)
Addended by: Karlena Luebke H on: 03/06/2014 03:47 PM   Modules accepted: Orders

## 2014-03-07 NOTE — Telephone Encounter (Signed)
Nebules sent to pharm .

## 2014-03-09 ENCOUNTER — Ambulatory Visit (INDEPENDENT_AMBULATORY_CARE_PROVIDER_SITE_OTHER): Payer: No Typology Code available for payment source | Admitting: Family Medicine

## 2014-03-09 VITALS — BP 130/68 | HR 81 | Temp 97.8°F | Resp 15 | Ht 61.5 in | Wt 349.0 lb

## 2014-03-09 DIAGNOSIS — J45909 Unspecified asthma, uncomplicated: Secondary | ICD-10-CM

## 2014-03-09 DIAGNOSIS — J441 Chronic obstructive pulmonary disease with (acute) exacerbation: Secondary | ICD-10-CM | POA: Diagnosis not present

## 2014-03-09 MED ORDER — IPRATROPIUM BROMIDE 0.02 % IN SOLN
0.5000 mg | Freq: Once | RESPIRATORY_TRACT | Status: AC
  Administered 2014-03-09: 0.5 mg via RESPIRATORY_TRACT

## 2014-03-09 MED ORDER — ALBUTEROL SULFATE (2.5 MG/3ML) 0.083% IN NEBU
2.5000 mg | INHALATION_SOLUTION | Freq: Once | RESPIRATORY_TRACT | Status: AC
  Administered 2014-03-09: 2.5 mg via RESPIRATORY_TRACT

## 2014-03-09 NOTE — Patient Instructions (Signed)
Continue to drink plenty of fluids.  Work on doing. Deep breathing and cough periodically to keep your lungs clear.  Continue the same medications  If you do not hear from the pulmonary referral by Tuesday please call back and speak to the referrals desk here.  Call 911 at anytime in the event of major difficulty breathing

## 2014-03-09 NOTE — Progress Notes (Signed)
Subjective: Patient is here for a recheck. She feels like she's been doing better. She is taking the prednisone still. She has been using her nebulizer at home. She's not been smoking.  Objective: She looks much more comfortable today. Still has a bit of a wetness to the cough when she tries to cough some. Her neck is supple. Chest doesn't have any obvious wheezing it. She has purchased change and prolonged expiration time phase but no wheezing like she had the other day, and on auscultation has significantly better sounding air movement. Peak flow was 180, up from the 140 the other day which was her best after a treatment.  Assessment: Asthma COPD with exacerbation  Plan: Continue self the cigarettes. Due a nebulizer treatment with albuterol and Atrovent and let's see what her lungs will open up to now.  After treatment her peak flow rose to 300. She is feeling better. She was instructed to followup in about 4-6 weeks if she is doing well. I still think his appointment she sees a pulmonary specialist, and that referral is in progress.

## 2014-03-25 ENCOUNTER — Encounter (HOSPITAL_COMMUNITY): Payer: Self-pay | Admitting: Emergency Medicine

## 2014-03-25 ENCOUNTER — Emergency Department (HOSPITAL_COMMUNITY)
Admission: EM | Admit: 2014-03-25 | Discharge: 2014-03-25 | Disposition: A | Discharge status: Home / Self Care | Payer: No Typology Code available for payment source | Attending: Emergency Medicine | Admitting: Emergency Medicine

## 2014-03-25 DIAGNOSIS — E669 Obesity, unspecified: Secondary | ICD-10-CM | POA: Insufficient documentation

## 2014-03-25 DIAGNOSIS — J45901 Unspecified asthma with (acute) exacerbation: Secondary | ICD-10-CM | POA: Insufficient documentation

## 2014-03-25 DIAGNOSIS — Z862 Personal history of diseases of the blood and blood-forming organs and certain disorders involving the immune mechanism: Secondary | ICD-10-CM | POA: Insufficient documentation

## 2014-03-25 DIAGNOSIS — J4541 Moderate persistent asthma with (acute) exacerbation: Secondary | ICD-10-CM

## 2014-03-25 DIAGNOSIS — Z79899 Other long term (current) drug therapy: Secondary | ICD-10-CM | POA: Insufficient documentation

## 2014-03-25 DIAGNOSIS — Z87891 Personal history of nicotine dependence: Secondary | ICD-10-CM | POA: Insufficient documentation

## 2014-03-25 HISTORY — DX: Obesity, unspecified: E66.9

## 2014-03-25 HISTORY — DX: Unspecified asthma, uncomplicated: J45.909

## 2014-03-25 MED ORDER — IPRATROPIUM BROMIDE 0.02 % IN SOLN
0.5000 mg | Freq: Once | RESPIRATORY_TRACT | Status: AC
  Administered 2014-03-25: 0.5 mg via RESPIRATORY_TRACT
  Filled 2014-03-25: qty 2.5

## 2014-03-25 MED ORDER — ALBUTEROL SULFATE (2.5 MG/3ML) 0.083% IN NEBU
5.0000 mg | INHALATION_SOLUTION | Freq: Once | RESPIRATORY_TRACT | Status: AC
  Administered 2014-03-25: 5 mg via RESPIRATORY_TRACT
  Filled 2014-03-25: qty 6

## 2014-03-25 MED ORDER — IBUPROFEN 800 MG PO TABS
800.0000 mg | ORAL_TABLET | Freq: Once | ORAL | Status: AC
  Administered 2014-03-25: 800 mg via ORAL
  Filled 2014-03-25: qty 1

## 2014-03-25 MED ORDER — PREDNISONE 10 MG PO TABS: 20.0000 mg | ORAL_TABLET | Freq: Every day | ORAL | Status: DC

## 2014-03-25 MED ORDER — PREDNISONE 20 MG PO TABS
60.0000 mg | ORAL_TABLET | Freq: Once | ORAL | Status: AC
  Administered 2014-03-25: 60 mg via ORAL
  Filled 2014-03-25: qty 3

## 2014-03-25 MED ORDER — AZITHROMYCIN 250 MG PO TABS: 250.0000 mg | ORAL_TABLET | Freq: Every day | ORAL | Status: DC

## 2014-03-25 NOTE — ED Notes (Signed)
Previous note entered on incorrect pt

## 2014-03-25 NOTE — Discharge Instructions (Signed)
You were treated for your asthma exacerbation. Please followup with your primary care provider for continued evaluation and treatment. Return for any changing or worsening symptoms.    Asthma, Adult Asthma is a condition of the lungs in which the airways tighten and narrow. Asthma can make it hard to breathe. Asthma cannot be cured, but medicine and lifestyle changes can help control it. Asthma may be started (triggered) by:  Animal skin flakes (dander).  Dust.  Cockroaches.  Pollen.  Mold.  Smoke.  Cleaning products.  Hair sprays or aerosol sprays.  Paint fumes or strong smells.  Cold air, weather changes, and winds.  Crying or laughing hard.  Stress.  Certain medicines or drugs.  Foods, such as dried fruit, potato chips, and sparkling grape juice.  Infections or conditions (colds, flu).  Exercise.  Certain medical conditions or diseases.  Exercise or tiring activities. HOME CARE   Take medicine as told by your doctor.  Use a peak flow meter as told by your doctor. A peak flow meter is a tool that measures how well the lungs are working.  Record and keep track of the peak flow meter's readings.  Understand and use the asthma action plan. An asthma action plan is a written plan for taking care of your asthma and treating your attacks.  To help prevent asthma attacks:  Do not smoke. Stay away from secondhand smoke.  Change your heating and air conditioning filter often.  Limit your use of fireplaces and wood stoves.  Get rid of pests (such as roaches and mice) and their droppings.  Throw away plants if you see mold on them.  Clean your floors. Dust regularly. Use cleaning products that do not smell.  Have someone vacuum when you are not home. Use a vacuum cleaner with a HEPA filter if possible.  Replace carpet with wood, tile, or vinyl flooring. Carpet can trap animal skin flakes and dust.  Use allergy-proof pillows, mattress covers, and box spring  covers.  Wash bed sheets and blankets every week in hot water and dry them in a dryer.  Use blankets that are made of polyester or cotton.  Clean bathrooms and kitchens with bleach. If possible, have someone repaint the walls in these rooms with mold-resistant paint. Keep out of the rooms that are being cleaned and painted.  Wash hands often. GET HELP IF:  You have make a whistling sound when breaking (wheeze), have shortness of breath, or have a cough even if taking medicine to prevent attacks.  The colored mucus you cough up (sputum) is thicker than usual.  The colored mucus you cough up changes from clear or white to yellow, green, gray, or bloody.  You have problems from the medicine you are taking such as:  A rash.  Itching.  Swelling.  Trouble breathing.  You need reliever medicines more than 2-3 times a week.  Your peak flow measurement is still at 50-79% of your personal best after following the action plan for 1 hour. GET HELP RIGHT AWAY IF:   You seem to be worse and are not responding to medicine during an asthma attack.  You are short of breath even at rest.  You get short of breath when doing very little activity.  You have trouble eating, drinking, or talking.  You have chest pain.  You have a fast heartbeat.  Your lips or fingernails start to turn blue.  You are lightheaded, dizzy, or faint.  Your peak flow is less than 50%  of your personal best.  You have a fever or lasting symptoms for more than 2-3 days.  You have a fever and your symptoms suddenly get worse. MAKE SURE YOU:   Understand these instructions.  Will watch your condition.  Will get help right away if you are not doing well or get worse. Document Released: 03/01/2008 Document Revised: 07/04/2013 Document Reviewed: 04/12/2013 [facility] Patient Information 2015 [place], [place]. This information is not intended to replace advice given to you by your health care provider. Make sure  you discuss any questions you have with your health care provider.

## 2014-03-25 NOTE — ED Notes (Signed)
Signature pad in room not working  Pt unable to sign discharge

## 2014-03-25 NOTE — ED Notes (Signed)
Pt is c/o headache  PA, Dammen made aware

## 2014-03-25 NOTE — ED Provider Notes (Signed)
CSN: [id number]     Arrival date & time 03/25/14  0109 History   First MD Initiated Contact with Patient 03/25/14 [phone]     Chief Complaint  Patient presents with  . Shortness of Breath   HPI  History provided by the patient. The patient is a 38 year old female with past history of asthma, episodes of bronchitis and obesity presenting with worsening shortness of breath, wheezing and asthma symptoms. Patient has had slight symptoms for the past few days including cough and congestion. She is having worsening asthma all day. She has been using her albuterol inhaler but has not feel any significant movements. She denies any recent travel, no known sick contacts, fever, chills or sweats. Patient was a former smoker and recently quit in the last week. No other aggravating or alleviating factors. No other associated symptoms.    Past Medical History  Diagnosis Date  . Anemia   . Bronchitis   . Asthma   . Obesity    History reviewed. No pertinent past surgical history. Family History  Problem Relation Age of Onset  . Diabetes Mother   . Diabetes Sister   . Hypertension Sister   . Hypertension Sister    History  Substance Use Topics  . Smoking status: Former Research scientist (life sciences)  . Smokeless tobacco: Not on file  . Alcohol Use: No   OB History   Grav Para Term Preterm Abortions TAB SAB Ect Mult Living                 Review of Systems  Constitutional: Negative for fever, chills and diaphoresis.  Respiratory: Positive for cough, shortness of breath and wheezing.   Cardiovascular: Negative for chest pain, palpitations and leg swelling.  Gastrointestinal: Negative for nausea, vomiting and diarrhea.  All other systems reviewed and are negative.     Allergies  Review of patient's allergies indicates no known allergies.  Home Medications   Prior to Admission medications   Medication Sig Start Date End Date Taking? Authorizing Raveen Wieseler  albuterol (PROVENTIL HFA;VENTOLIN HFA) 108 (90 BASE)  MCG/ACT inhaler Inhale 2 puffs into the lungs every 6 (six) hours as needed for wheezing or shortness of breath. 03/06/14  Yes Posey Boyer, MD  albuterol (PROVENTIL) (2.5 MG/3ML) 0.083% nebulizer solution Take 3 mLs (2.5 mg total) by nebulization every 6 (six) hours as needed for wheezing or shortness of breath. 03/06/14  Yes Posey Boyer, MD  beclomethasone (QVAR) 80 MCG/ACT inhaler Inhale 2 puffs into the lungs 2 (two) times daily. 03/06/14  Yes Posey Boyer, MD   BP 163/70  Pulse 97  Temp(Src) 98.8 F (37.1 C) (Oral)  Resp 24  SpO2 93%  LMP 02/27/2014 Physical Exam  Nursing note and vitals reviewed. Constitutional: She is oriented to person, place, and time. She appears well-developed and well-nourished. No distress.  HENT:  Head: Normocephalic.  Mouth/Throat: Oropharynx is clear and moist.  Cardiovascular: Normal rate and regular rhythm.   Pulmonary/Chest: Effort normal. No respiratory distress. She has wheezes. She has no rales.  Abdominal: Soft. There is no tenderness.  Neurological: She is alert and oriented to person, place, and time.  Skin: Skin is warm and dry. No rash noted.  Psychiatric: She has a normal mood and affect. Her behavior is normal.    ED Course  Procedures   COORDINATION OF CARE:  Nursing notes reviewed. Vital signs reviewed. Initial pt interview and examination performed.   Filed Vitals:   03/25/14 0121  BP: 163/70  Pulse:  97  Temp: 98.8 F (37.1 C)  TempSrc: Oral  Resp: 24  SpO2: 93%    4:39 AM-patient seen and evaluated. She does not appear in acute distress or severe respiratory distress. Continues to complain of some shortness of breath, wheezing and asthma symptoms. Does have wheezing throughout. Will give breathing treatment.  Patient having significant improvement after first predrilling the. Does feel a second treatment would be more helpful. She was given prednisone. After second treatment I feel she would be stable for discharge  home and continued outpatient treatment. Patient agrees with this plan.   Treatment plan initiated: Medications  predniSONE (DELTASONE) tablet 60 mg (60 mg Oral Given 03/25/14 0503)  albuterol (PROVENTIL) (2.5 MG/3ML) 0.083% nebulizer solution 5 mg (5 mg Nebulization Given 03/25/14 0504)  ipratropium (ATROVENT) nebulizer solution 0.5 mg (0.5 mg Nebulization Given 03/25/14 0504)  albuterol (PROVENTIL) (2.5 MG/3ML) 0.083% nebulizer solution 5 mg (5 mg Nebulization Given 03/25/14 0553)  ibuprofen (ADVIL,MOTRIN) tablet 800 mg (800 mg Oral Given 03/25/14 0605)        MDM   Final diagnoses:  Asthma, moderate persistent, with acute exacerbation       Martie Lee, PA-C 03/26/14 0327

## 2014-03-25 NOTE — ED Notes (Signed)
Pt states that she is feeling short of breath and has been feeling that way all day  Pt has hx of asthma  Pt states she has been using her inhaler without relief

## 2014-03-26 NOTE — ED Provider Notes (Signed)
Medical screening examination/treatment/procedure(s) were performed by non-physician practitioner and as supervising physician I was immediately available for consultation/collaboration.   EKG Interpretation None       Kalman Drape, MD 03/26/14 [phone]

## 2014-04-01 ENCOUNTER — Ambulatory Visit (INDEPENDENT_AMBULATORY_CARE_PROVIDER_SITE_OTHER): Payer: No Typology Code available for payment source | Admitting: Internal Medicine

## 2014-04-01 ENCOUNTER — Encounter: Payer: Self-pay | Admitting: Internal Medicine

## 2014-04-01 ENCOUNTER — Other Ambulatory Visit: Payer: No Typology Code available for payment source

## 2014-04-01 VITALS — BP 122/90 | HR 98 | Ht 61.0 in | Wt 371.0 lb

## 2014-04-01 DIAGNOSIS — R0602 Shortness of breath: Secondary | ICD-10-CM

## 2014-04-01 NOTE — Patient Instructions (Signed)
Unclear cause of shortness of breath would like to due too many reasons for obesity, undiagnosed asthma, undiagnosed sleep apnea all playing a role  -  Do d-dimer blood test to rule out blood clot-will call her with the results - Do full PFT ASAP  - refer to sleep doctor in our office  [place]owup  - sleep specialist in our office  - do PFT  And return to see my NP after PFT

## 2014-04-01 NOTE — Progress Notes (Signed)
Subjective:    Patient ID: April Ellis, female    DOB: [birth year], 38 y.o.   MRN: [id number] [name], MD - pcp    HPI   IOV 04/01/2014  Chief Complaint  Patient presents with  . Pulmonary Consult    Referred by Dr. Ruben Reason for hypoxia   38 year old extremely moprbildy obese female with a body mass index of 70. Less than 5 pack smoking history since age 26. Which is trying to quit. Has a long-standing history of shortness of breath, chest tightness and cough that usually comes on after a cold but for the last several years possibly related to weight gain has dyspnea on exertion as well. Symptoms are relieved by prednisone orinhalers. But also by rest. Not really care about an allergy history. She is annual ER visits for exacerbations for which she gets prednisone that helps. Her history is positive for excessive daytime somnolence which she says runs in the family.  She has not formally been diagnosed with asthma or sleep apnea  She denies PE risk factors other than obesity     has a past medical history of Anemia; Bronchitis; Asthma; and Obesity.   has no past surgical history on file.   reports that she has been smoking Cigarettes.  She has been smoking about 0.25 packs per day. She does not have any smokeless tobacco history on file.  cxr 03/06/14 - clear     Past Medical History  Diagnosis Date  . Anemia   . Bronchitis   . Asthma   . Obesity      Family History  Problem Relation Age of Onset  . Diabetes Mother   . Diabetes Sister   . Hypertension Sister   . Hypertension Sister      History   Social History  . Marital Status: Single    Spouse Name: N/A    Number of Children: N/A  . Years of Education: N/A   Occupational History  . Not on file.   Social History Main Topics  . Smoking status: Current Some Day Smoker -- 0.25 packs/day    Types: Cigarettes  . Smokeless tobacco: Not on file     Comment: 1-2 cigs per day  . Alcohol Use: No    . Drug Use: No  . Sexual Activity: Not on file   Other Topics Concern  . Not on file   Social History Narrative  . No narrative on file     No Known Allergies   Outpatient Prescriptions Prior to Visit  Medication Sig Dispense Refill  . albuterol (PROVENTIL HFA;VENTOLIN HFA) 108 (90 BASE) MCG/ACT inhaler Inhale 2 puffs into the lungs every 6 (six) hours as needed for wheezing or shortness of breath.  1 Inhaler  0  . albuterol (PROVENTIL) (2.5 MG/3ML) 0.083% nebulizer solution Take 3 mLs (2.5 mg total) by nebulization every 6 (six) hours as needed for wheezing or shortness of breath.  150 mL  2  . beclomethasone (QVAR) 80 MCG/ACT inhaler Inhale 2 puffs into the lungs 2 (two) times daily.  1 Inhaler  3  . azithromycin (ZITHROMAX) 250 MG tablet Take 1 tablet (250 mg total) by mouth daily. Take first 2 tablets together, then 1 every day until finished.  6 tablet  0  . predniSONE (DELTASONE) 10 MG tablet Take 2 tablets (20 mg total) by mouth daily.  10 tablet  0   No facility-administered medications prior to visit.       Review  of Systems  Constitutional: Negative for fever and unexpected weight change.  HENT: Negative for congestion, dental problem, ear pain, nosebleeds, postnasal drip, rhinorrhea, sinus pressure, sneezing, sore throat and trouble swallowing.   Eyes: Negative for redness and itching.  Respiratory: Positive for cough, chest tightness, shortness of breath and wheezing.   Cardiovascular: Positive for palpitations. Negative for leg swelling.  Gastrointestinal: Negative for nausea and vomiting.  Genitourinary: Negative for dysuria.  Musculoskeletal: Negative for joint swelling.  Skin: Negative for rash.  Neurological: Negative for headaches.  Hematological: Does not bruise/bleed easily.  Psychiatric/Behavioral: Negative for dysphoric mood. The patient is not nervous/anxious.        Objective:   Physical Exam  Vitals reviewed. Constitutional: She is oriented to  person, place, and time. She appears well-developed and well-nourished. No distress.  Body mass index is 70.14 kg/(m^2).   HENT:  Head: Normocephalic and atraumatic.  Right Ear: External ear normal.  Left Ear: External ear normal.  Mouth/Throat: Oropharynx is clear and moist. No oropharyngeal exudate.  Eyes: Conjunctivae and EOM are normal. Pupils are equal, round, and reactive to light. Right eye exhibits no discharge. Left eye exhibits no discharge. No scleral icterus.  Neck: Normal range of motion. Neck supple. No JVD present. No tracheal deviation present. No thyromegaly present.  Cardiovascular: Normal rate, regular rhythm, normal heart sounds and intact distal pulses.  Exam reveals no gallop and no friction rub.   No murmur heard. Pulmonary/Chest: Effort normal and breath sounds normal. No respiratory distress. She has no wheezes. She has no rales. She exhibits no tenderness.  Very obese, unaable to ausculate well  Abdominal: Soft. Bowel sounds are normal. She exhibits no distension and no mass. There is no tenderness. There is no rebound and no guarding.  Musculoskeletal: Normal range of motion. She exhibits no edema and no tenderness.  Lymphadenopathy:    She has no cervical adenopathy.  Neurological: She is alert and oriented to person, place, and time. She has normal reflexes. No cranial nerve deficit. She exhibits normal muscle tone. Coordination normal.  Skin: Skin is warm and dry. No rash noted. She is not diaphoretic. No erythema. No pallor.  Psychiatric: She has a normal mood and affect. Her behavior is normal. Judgment and thought content normal.    Filed Vitals:   04/01/14 1519  BP: 122/90  Pulse: 98  Height: 5\' 1"  (1.549 m)  Weight: 371 lb (168.284 kg)  SpO2: 97%         Assessment & Plan:  Unclear cause of shortness of breath would like to due too many reasons for obesity, undiagnosed asthma, undiagnosed sleep apnea all playing a role  -  Do d-dimer blood test  to rule out blood clot-will call her with the results - Do full PFT ASAP  - refer to sleep doctor in our office  [place]up  - sleep specialist in our office  - do PFT  And return to see my NP after PFT; depending on results will need methacholine challenge v CT v CPST v some combo

## 2014-04-02 LAB — D-DIMER, QUANTITATIVE (NOT AT ARMC): D-Dimer, Quant: 0.32 ug/mL-FEU (ref 0.00–0.48)

## 2014-04-03 NOTE — Progress Notes (Signed)
Quick Note:  lmtcb ______ 

## 2014-04-04 ENCOUNTER — Telehealth: Payer: Self-pay | Admitting: Internal Medicine

## 2014-04-04 NOTE — Progress Notes (Signed)
Quick Note:  Elie Confer, CMA at 04/04/2014 4:52 PM   Status: Signed   Pt called back and she is aware of results per MR. Pt voiced her understanding and nothing further is needed.    ______

## 2014-04-04 NOTE — Telephone Encounter (Signed)
Pt states she is returning call.  April Ellis

## 2014-04-04 NOTE — Telephone Encounter (Signed)
D-dimer is normal. Unlikely PE. Do other workup as recommended in OV  lmomtcb x 1 for pt

## 2014-04-04 NOTE — Telephone Encounter (Signed)
Pt called back and she is aware of results per MR.  Pt voiced her understanding and nothing further is needed.

## 2014-04-07 NOTE — Assessment & Plan Note (Signed)
Unclear cause of shortness of breath would like to due too many reasons for obesity, undiagnosed asthma, undiagnosed sleep apnea all playing a role  -  Do d-dimer blood test to rule out blood clot-will call her with the results - Do full PFT ASAP  - refer to sleep doctor in our office  FOllowup  - sleep specialist in our office  - do PFT  And return to see my NP after PFT; depending on results will need methacholine challenge v CT v CPST v some combo

## 2014-04-09 ENCOUNTER — Encounter (HOSPITAL_COMMUNITY): Payer: Self-pay | Admitting: Emergency Medicine

## 2014-04-09 ENCOUNTER — Emergency Department (HOSPITAL_COMMUNITY): Payer: No Typology Code available for payment source

## 2014-04-09 ENCOUNTER — Emergency Department (HOSPITAL_COMMUNITY)
Admission: EM | Admit: 2014-04-09 | Discharge: 2014-04-09 | Disposition: A | Discharge status: Home / Self Care | Payer: No Typology Code available for payment source | Attending: Emergency Medicine | Admitting: Emergency Medicine

## 2014-04-09 DIAGNOSIS — M545 Low back pain, unspecified: Secondary | ICD-10-CM | POA: Insufficient documentation

## 2014-04-09 DIAGNOSIS — Z3202 Encounter for pregnancy test, result negative: Secondary | ICD-10-CM | POA: Insufficient documentation

## 2014-04-09 DIAGNOSIS — J45901 Unspecified asthma with (acute) exacerbation: Secondary | ICD-10-CM | POA: Insufficient documentation

## 2014-04-09 DIAGNOSIS — E669 Obesity, unspecified: Secondary | ICD-10-CM | POA: Insufficient documentation

## 2014-04-09 DIAGNOSIS — R002 Palpitations: Secondary | ICD-10-CM | POA: Insufficient documentation

## 2014-04-09 DIAGNOSIS — H579 Unspecified disorder of eye and adnexa: Secondary | ICD-10-CM | POA: Insufficient documentation

## 2014-04-09 DIAGNOSIS — J40 Bronchitis, not specified as acute or chronic: Secondary | ICD-10-CM

## 2014-04-09 DIAGNOSIS — R300 Dysuria: Secondary | ICD-10-CM | POA: Insufficient documentation

## 2014-04-09 DIAGNOSIS — Z79899 Other long term (current) drug therapy: Secondary | ICD-10-CM | POA: Insufficient documentation

## 2014-04-09 DIAGNOSIS — D649 Anemia, unspecified: Secondary | ICD-10-CM | POA: Insufficient documentation

## 2014-04-09 DIAGNOSIS — F17200 Nicotine dependence, unspecified, uncomplicated: Secondary | ICD-10-CM | POA: Insufficient documentation

## 2014-04-09 LAB — URINALYSIS, ROUTINE W REFLEX MICROSCOPIC
Bilirubin Urine: NEGATIVE
GLUCOSE, UA: NEGATIVE mg/dL
Hgb urine dipstick: NEGATIVE
KETONES UR: NEGATIVE mg/dL
LEUKOCYTES UA: NEGATIVE
NITRITE: NEGATIVE
PH: 7 (ref 5.0–8.0)
PROTEIN: NEGATIVE mg/dL
Specific Gravity, Urine: 1.015 (ref 1.005–1.030)
Urobilinogen, UA: 1 mg/dL (ref 0.0–1.0)

## 2014-04-09 LAB — BASIC METABOLIC PANEL
Anion gap: 11 (ref 5–15)
BUN: 12 mg/dL (ref 6–23)
CHLORIDE: 98 meq/L (ref 96–112)
CO2: 28 meq/L (ref 19–32)
Calcium: 8.9 mg/dL (ref 8.4–10.5)
Creatinine, Ser: 0.67 mg/dL (ref 0.50–1.10)
GFR calc Af Amer: >90 mL/min (ref >90)
GFR calc non Af Amer: >90 mL/min (ref >90)
GLUCOSE: 75 mg/dL (ref 70–99)
POTASSIUM: 4.2 meq/L (ref 3.7–5.3)
SODIUM: 137 meq/L (ref 137–147)

## 2014-04-09 LAB — CBC WITH DIFFERENTIAL/PLATELET
Basophils Absolute: 0 10*3/uL (ref 0.0–0.1)
Basophils Relative: 0 % (ref 0–1)
EOS PCT: 3 % (ref 0–5)
Eosinophils Absolute: 0.4 10*3/uL (ref 0.0–0.7)
HCT: 32.9 % — ABNORMAL LOW (ref 36.0–46.0)
HEMOGLOBIN: 9.5 g/dL — AB (ref 12.0–15.0)
Lymphocytes Relative: 18 % (ref 12–46)
Lymphs Abs: 2.4 10*3/uL (ref 0.7–4.0)
MCH: 20.5 pg — AB (ref 26.0–34.0)
MCHC: 28.9 g/dL — ABNORMAL LOW (ref 30.0–36.0)
MCV: 70.9 fL — ABNORMAL LOW (ref 78.0–100.0)
Monocytes Absolute: 1.2 10*3/uL — ABNORMAL HIGH (ref 0.1–1.0)
Monocytes Relative: 9 % (ref 3–12)
NEUTROS PCT: 70 % (ref 43–77)
Neutro Abs: 9.4 10*3/uL — ABNORMAL HIGH (ref 1.7–7.7)
Platelets: 323 10*3/uL (ref 150–400)
RBC: 4.64 MIL/uL (ref 3.87–5.11)
RDW: 19.7 % — ABNORMAL HIGH (ref 11.5–15.5)
WBC: 13.4 10*3/uL — AB (ref 4.0–10.5)

## 2014-04-09 LAB — PREGNANCY, URINE: Preg Test, Ur: NEGATIVE

## 2014-04-09 MED ORDER — IPRATROPIUM-ALBUTEROL 0.5-2.5 (3) MG/3ML IN SOLN
3.0000 mL | Freq: Once | RESPIRATORY_TRACT | Status: AC
  Administered 2014-04-09: 3 mL via RESPIRATORY_TRACT
  Filled 2014-04-09: qty 3

## 2014-04-09 MED ORDER — PREDNISONE 20 MG PO TABS
60.0000 mg | ORAL_TABLET | Freq: Once | ORAL | Status: AC
  Administered 2014-04-09: 60 mg via ORAL
  Filled 2014-04-09: qty 3

## 2014-04-09 MED ORDER — FLUORESCEIN SODIUM 1 MG OP STRP
1.0000 | ORAL_STRIP | Freq: Once | OPHTHALMIC | Status: AC
  Administered 2014-04-09: 1 via OPHTHALMIC
  Filled 2014-04-09: qty 1

## 2014-04-09 MED ORDER — PREDNISONE 20 MG PO TABS: 40.0000 mg | ORAL_TABLET | Freq: Every day | ORAL | Status: DC

## 2014-04-09 MED ORDER — TETRACAINE HCL 0.5 % OP SOLN
1.0000 [drp] | Freq: Once | OPHTHALMIC | Status: AC
  Administered 2014-04-09: 1 [drp] via OPHTHALMIC
  Filled 2014-04-09: qty 2

## 2014-04-09 MED ORDER — AZITHROMYCIN 250 MG PO TABS: 250.0000 mg | ORAL_TABLET | Freq: Every day | ORAL | Status: DC

## 2014-04-09 NOTE — ED Notes (Signed)
Pt aware urine is needed.  

## 2014-04-09 NOTE — ED Notes (Signed)
Pt states she has been having shortness of breath for a month. States she had "an episode" today and used her inhaler at work. Pt reports that her heart was "racing" earlier and now she feels jittery. Pt also states she has had some R eye blurriness since Sunday. Pt denies irritation or itching to R eye. Pt also c/o L flank pain with dysuria for the past 2 days. States she has hx of kidney infection. Pt has no acute distress. Skin warm and dry.

## 2014-04-09 NOTE — Discharge Instructions (Signed)
Please follow-up with your lung, eye and primary doctor  Continue albuterol treatments 2 puffs every 4 hours  Take steroids and antibiotic  Return to the emergency department if you develop any changing/worsening condition, difficulty breathing not relieved by the inhaler, coughing up blood, chest pain, loss of vision, severe headache, or any other concerns (please read additional information regarding your condition below)    Asthma Asthma is a recurring condition in which the airways tighten and narrow. Asthma can make it difficult to breathe. It can cause coughing, wheezing, and shortness of breath. Asthma episodes, also called asthma attacks, range from minor to life-threatening. Asthma cannot be cured, but medicines and lifestyle changes can help control it. CAUSES Asthma is believed to be caused by inherited (genetic) and environmental factors, but its exact cause is unknown. Asthma may be triggered by allergens, lung infections, or irritants in the air. Asthma triggers are different for each person. Common triggers include:   Animal dander.  Dust mites.  Cockroaches.  Pollen from trees or grass.  Mold.  Smoke.  Air pollutants such as dust, household cleaners, hair sprays, aerosol sprays, paint fumes, strong chemicals, or strong odors.  Cold air, weather changes, and winds (which increase molds and pollens in the air).  Strong emotional expressions such as crying or laughing hard.  Stress.  Certain medicines (such as aspirin) or types of drugs (such as beta-blockers).  Sulfites in foods and drinks. Foods and drinks that may contain sulfites include dried fruit, potato chips, and sparkling grape juice.  Infections or inflammatory conditions such as the flu, a cold, or an inflammation of the nasal membranes (rhinitis).  Gastroesophageal reflux disease (GERD).  Exercise or strenuous activity. SYMPTOMS Symptoms may occur immediately after asthma is triggered or many hours  later. Symptoms include:  Wheezing.  Excessive nighttime or early morning coughing.  Frequent or severe coughing with a common cold.  Chest tightness.  Shortness of breath. DIAGNOSIS  The diagnosis of asthma is made by a review of your medical history and a physical exam. Tests may also be performed. These may include:  Lung function studies. These tests show how much air you breathe in and out.  Allergy tests.  Imaging tests such as X-rays. TREATMENT  Asthma cannot be cured, but it can usually be controlled. Treatment involves identifying and avoiding your asthma triggers. It also involves medicines. There are 2 classes of medicine used for asthma treatment:   Controller medicines. These prevent asthma symptoms from occurring. They are usually taken every day.  Reliever or rescue medicines. These quickly relieve asthma symptoms. They are used as needed and provide short-term relief. Your health care provider will help you create an asthma action plan. An asthma action plan is a written plan for managing and treating your asthma attacks. It includes a list of your asthma triggers and how they may be avoided. It also includes information on when medicines should be taken and when their dosage should be changed. An action plan may also involve the use of a device called a peak flow meter. A peak flow meter measures how well the lungs are working. It helps you monitor your condition. HOME CARE INSTRUCTIONS   Take medicine as directed by your health care provider. Speak with your health care provider if you have questions about how or when to take the medicines.  Use a peak flow meter as directed by your health care provider. Record and keep track of readings.  Understand and use the action  plan to help minimize or stop an asthma attack without needing to seek medical care.  Control your home environment in the following ways to help prevent asthma attacks:  Do not smoke. Avoid being  exposed to secondhand smoke.  Change your heating and air conditioning filter regularly.  Limit your use of fireplaces and wood stoves.  Get rid of pests (such as roaches and mice) and their droppings.  Throw away plants if you see mold on them.  Clean your floors and dust regularly. Use unscented cleaning products.  Try to have someone else vacuum for you regularly. Stay out of rooms while they are being vacuumed and for a short while afterward. If you vacuum, use a dust mask from a hardware store, a double-layered or microfilter vacuum cleaner bag, or a vacuum cleaner with a HEPA filter.  Replace carpet with wood, tile, or vinyl flooring. Carpet can trap dander and dust.  Use allergy-proof pillows, mattress covers, and box spring covers.  Wash bed sheets and blankets every week in hot water and dry them in a dryer.  Use blankets that are made of polyester or cotton.  Clean bathrooms and kitchens with bleach. If possible, have someone repaint the walls in these rooms with mold-resistant paint. Keep out of the rooms that are being cleaned and painted.  Wash hands frequently. SEEK MEDICAL CARE IF:   You have wheezing, shortness of breath, or a cough even if taking medicine to prevent attacks.  The colored mucus you cough up (sputum) is thicker than usual.  Your sputum changes from clear or white to yellow, green, gray, or bloody.  You have any problems that may be related to the medicines you are taking (such as a rash, itching, swelling, or trouble breathing).  You are using a reliever medicine more than 2-3 times per week.  Your peak flow is still at 50-79% of your personal best after following your action plan for 1 hour. SEEK IMMEDIATE MEDICAL CARE IF:   You seem to be getting worse and are unresponsive to treatment during an asthma attack.  You are short of breath even at rest.  You get short of breath when doing very little physical activity.  You have difficulty  eating, drinking, or talking due to asthma symptoms.  You develop chest pain.  You develop a fast heartbeat.  You have a bluish color to your lips or fingernails.  You are lightheaded, dizzy, or faint.  Your peak flow is less than 50% of your personal best.  You have a fever or persistent symptoms for more than 2-3 days.  You have a fever and symptoms suddenly get worse. MAKE SURE YOU:   Understand these instructions.  Will watch your condition.  Will get help right away if you are not doing well or get worse. Document Released: 09/13/2005 Document Revised: 09/18/2013 Document Reviewed: 04/12/2013 [facility] Patient Information 2015 [place], [place]. This information is not intended to replace advice given to you by your health care provider. Make sure you discuss any questions you have with your health care provider.  Bronchitis Bronchitis is inflammation of the airways that extend from the windpipe into the lungs (bronchi). The inflammation often causes mucus to develop, which leads to a cough. If the inflammation becomes severe, it may cause shortness of breath. CAUSES  Bronchitis may be caused by:  Viral infections.  Bacteria.  Cigarette smoke.  Allergens, pollutants, and other irritants.  SIGNS AND SYMPTOMS  The most common symptom of bronchitis is  a frequent cough that produces mucus. Other symptoms include: Fever.  Body aches.  Chest congestion.  Chills.  Shortness of breath.  Sore throat.  DIAGNOSIS  Bronchitis is usually diagnosed through a medical history and physical exam. Tests, such as chest X-rays, are sometimes done to rule out other conditions.  TREATMENT  You may need to avoid contact with whatever caused the problem (smoking, for example). Medicines are sometimes needed. These may include: Antibiotics. These may be prescribed if the condition is caused by bacteria. Cough suppressants. These may be prescribed for relief of cough symptoms.   Inhaled medicines. These may be prescribed to help open your airways and make it easier for you to breathe.  Steroid medicines. These may be prescribed for those with recurrent (chronic) bronchitis. HOME CARE INSTRUCTIONS Get plenty of rest.  Drink enough fluids to keep your urine clear or pale yellow (unless you have a medical condition that requires fluid restriction). Increasing fluids may help thin your secretions and will prevent dehydration.  Only take over-the-counter or prescription medicines as directed by your health care provider. Only take antibiotics as directed. Make sure you finish them even if you start to feel better. Avoid secondhand smoke, irritating chemicals, and strong fumes. These will make bronchitis worse. If you are a smoker, quit smoking. Consider using nicotine gum or skin patches to help control withdrawal symptoms. Quitting smoking will help your lungs heal faster.  Put a cool-mist humidifier in your bedroom at night to moisten the air. This may help loosen mucus. Change the water in the humidifier daily. You can also run the hot water in your shower and sit in the bathroom with the door closed for 5-10 minutes.  Follow up with your health care provider as directed.  Wash your hands frequently to avoid catching bronchitis again or spreading an infection to others.  SEEK MEDICAL CARE IF: Your symptoms do not improve after 1 week of treatment.  SEEK IMMEDIATE MEDICAL CARE IF: Your fever increases. You have chills.  You have chest pain.  You have worsening shortness of breath.  You have bloody sputum. You faint. You have lightheadedness. You have a severe headache.  You vomit repeatedly. MAKE SURE YOU:  Understand these instructions. Will watch your condition. Will get help right away if you are not doing well or get worse. Document Released: 09/13/2005 Document Revised: 07/04/2013 Document Reviewed: 05/08/2013 [facility] Patient Information 2015  [place], [place]. This information is not intended to replace advice given to you by your health care provider. Make sure you discuss any questions you have with your health care provider.  Visual Disturbances You have had a disturbance in your vision. This may be caused by various conditions, such as: Migraines. Migraine headaches are often preceded by a disturbance in vision. Blind spots or light flashes are followed by a headache. This type of visual disturbance is temporary. It does not damage the eye. Glaucoma. This is caused by increased pressure in the eye. Symptoms include haziness, blurred vision, or seeing rainbow colored circles when looking at bright lights. Partial or complete visual loss can occur. You may or may not experience eye pain. Visual loss may be gradual or sudden and is irreversible. Glaucoma is the leading cause of blindness. Retina problems. Vision will be reduced if the retina becomes detached or if there is a circulation problem as with diabetes, high blood pressure, or a mini-stroke. Symptoms include seeing "floaters," flashes of light, or shadows, as if a curtain has fallen over  your eye. Optic nerve problems. The main nerve in your eye can be damaged by redness, soreness, and swelling (inflammation), poor circulation, drugs, and toxins. It is very important to have a complete exam done by a specialist to determine the exact cause of your eye problem. The specialist may recommend medicines or surgery, depending on the cause of the problem. This can help prevent further loss of vision or reduce the risk of having a stroke. Contact the caregiver to whom you have been referred and arrange for follow-up care right away. SEEK IMMEDIATE MEDICAL CARE IF:  Your vision gets worse. You develop severe headaches. You have any weakness or numbness in the face, arms, or legs. You have any trouble speaking or walking. Document Released: 10/21/2004 Document Revised: 12/06/2011 Document  Reviewed: 02/11/2010 [facility] Patient Information 2015 [place], [place]. This information is not intended to replace advice given to you by your health care provider. Make sure you discuss any questions you have with your health care provider.  Back Pain, Adult Low back pain is very common. About 1 in 5 people have back pain.The cause of low back pain is rarely dangerous. The pain often gets better over time.About half of people with a sudden onset of back pain feel better in just 2 weeks. About 8 in 10 people feel better by 6 weeks.  CAUSES Some common causes of back pain include:  Strain of the muscles or ligaments supporting the spine.  Wear and tear (degeneration) of the spinal discs.  Arthritis.  Direct injury to the back. DIAGNOSIS Most of the time, the direct cause of low back pain is not known.However, back pain can be treated effectively even when the exact cause of the pain is unknown.Answering your caregiver's questions about your overall health and symptoms is one of the most accurate ways to make sure the cause of your pain is not dangerous. If your caregiver needs more information, he or she may order lab work or imaging tests (X-rays or MRIs).However, even if imaging tests show changes in your back, this usually does not require surgery. HOME CARE INSTRUCTIONS For many people, back pain returns.Since low back pain is rarely dangerous, it is often a condition that people can learn to [facility] their own.   Remain active. It is stressful on the back to sit or stand in one place. Do not sit, drive, or stand in one place for more than 30 minutes at a time. Take short walks on level surfaces as soon as pain allows.Try to increase the length of time you walk each day.  Do not stay in bed.Resting more than 1 or 2 days can delay your recovery.  Do not avoid exercise or work.Your body is made to move.It is not dangerous to be active, even though your back may hurt.Your back  will likely heal faster if you return to being active before your pain is gone.  Pay attention to your body when you bend and lift. Many people have less discomfortwhen lifting if they bend their knees, keep the load close to their bodies,and avoid twisting. Often, the most comfortable positions are those that put less stress on your recovering back.  Find a comfortable position to sleep. Use a firm mattress and lie on your side with your knees slightly bent. If you lie on your back, put a pillow under your knees.  Only take over-the-counter or prescription medicines as directed by your caregiver. Over-the-counter medicines to reduce pain and inflammation are often the most  helpful.Your caregiver may prescribe muscle relaxant drugs.These medicines help dull your pain so you can more quickly return to your normal activities and healthy exercise.  Put ice on the injured area.  Put ice in a plastic bag.  Place a towel between your skin and the bag.  Leave the ice on for 15-20 minutes, 03-04 times a day for the first 2 to 3 days. After that, ice and heat may be alternated to reduce pain and spasms.  Ask your caregiver about trying back exercises and gentle massage. This may be of some benefit.  Avoid feeling anxious or stressed.Stress increases muscle tension and can worsen back pain.It is important to recognize when you are anxious or stressed and learn ways to manage it.Exercise is a great option. SEEK MEDICAL CARE IF:  You have pain that is not relieved with rest or medicine.  You have pain that does not improve in 1 week.  You have new symptoms.  You are generally not feeling well. SEEK IMMEDIATE MEDICAL CARE IF:   You have pain that radiates from your back into your legs.  You develop new bowel or bladder control problems.  You have unusual weakness or numbness in your arms or legs.  You develop nausea or vomiting.  You develop abdominal pain.  You feel  faint. Document Released: 09/13/2005 Document Revised: 03/14/2012 Document Reviewed: 02/01/2011 [facility] Patient Information 2015 [place], [place]. This information is not intended to replace advice given to you by your health care provider. Make sure you discuss any questions you have with your health care provider.  Anemia, Nonspecific Anemia is a condition in which the concentration of red blood cells or hemoglobin in the blood is below normal. Hemoglobin is a substance in red blood cells that carries oxygen to the tissues of the body. Anemia results in not enough oxygen reaching these tissues.  CAUSES  Common causes of anemia include:   Excessive bleeding. Bleeding may be internal or external. This includes excessive bleeding from periods (in women) or from the intestine.   Poor nutrition.   Chronic kidney, thyroid, and liver disease.  Bone marrow disorders that decrease red blood cell production.  Cancer and treatments for cancer.  HIV, AIDS, and their treatments.  Spleen problems that increase red blood cell destruction.  Blood disorders.  Excess destruction of red blood cells due to infection, medicines, and autoimmune disorders. SIGNS AND SYMPTOMS   Minor weakness.   Dizziness.   Headache.  Palpitations.   Shortness of breath, especially with exercise.   Paleness.  Cold sensitivity.  Indigestion.  Nausea.  Difficulty sleeping.  Difficulty concentrating. Symptoms may occur suddenly or they may develop slowly.  DIAGNOSIS  Additional blood tests are often needed. These help your health care provider determine the best treatment. Your health care provider will check your stool for blood and look for other causes of blood loss.  TREATMENT  Treatment varies depending on the cause of the anemia. Treatment can include:   Supplements of iron, vitamin P71, or folic acid.   Hormone medicines.   A blood transfusion. This may be needed if blood loss is  severe.   Hospitalization. This may be needed if there is significant continual blood loss.   Dietary changes.  Spleen removal. HOME CARE INSTRUCTIONS Keep all follow-up appointments. It often takes many weeks to correct anemia, and having your health care provider check on your condition and your response to treatment is very important. SEEK IMMEDIATE MEDICAL CARE IF:   You  develop extreme weakness, shortness of breath, or chest pain.   You become dizzy or have trouble concentrating.  You develop heavy vaginal bleeding.   You develop a rash.   You have bloody or black, tarry stools.   You faint.   You vomit up blood.   You vomit repeatedly.   You have abdominal pain.  You have a fever or persistent symptoms for more than 2-3 days.   You have a fever and your symptoms suddenly get worse.   You are dehydrated.  MAKE SURE YOU:  Understand these instructions.  Will watch your condition.  Will get help right away if you are not doing well or get worse. Document Released: 10/21/2004 Document Revised: 05/16/2013 Document Reviewed: 03/09/2013 [facility] Patient Information 2015 [place], [place]. This information is not intended to replace advice given to you by your health care provider. Make sure you discuss any questions you have with your health care provider.

## 2014-04-09 NOTE — ED Provider Notes (Signed)
CSN: [id number]     Arrival date & time 04/09/14  1721 History   None    Chief Complaint  Patient presents with  . Shortness of Breath  . Dysuria  . Eye Problem   HPI  April Ellis is a 38 y.o. female with a PMH of anemia, bronchitis, asthma and obesity who presents to the ED for evaluation of SOB, dysuria, and eye problem. History was provided by the patient. Patient has multiple complaints.   Patient complains of SOB, wheezing, and chest tightness over the past 6 weeks. States her symptoms are intermittently improved by her albuterol inhaler. States today she had SOB and used her inhaler, which did not improve her symptoms. She became anxious and had palpitations, which resolved. Patient denies any chest pain. She has had a mild non-productive cough. No rhinorrhea, nasal congestion, or sore throat. She was seen by [facility] (Dr. Chase Caller) and had a negative d-dimer. Has plans for pulmonary function testing and sleep study. SOB thought to be due to morbid obesity vs asthma vs sleep apnea vs tobacco use. No history of DVT, PE, clotting disorder, cancer, recent surgery, immobilization, travel, or estrogen supplementation. Has mild bilateral leg swelling which is her baseline. This "happens sometimes when I stand for a long time."   Patient also complains of dysuria and urinary frequency for the past two days. She also has left lower back and flank pain. States pain is similar to her kidney infection in the past. Pain is worse with movement. Denies any abdominal pain, nausea, emesis, diarrhea, constipation, vaginal bleeding/discharge. Patient is currently sexually active. No concerns for STD or new partners.   Patient also complains of a constant right eye "haziness" or a "film" to her left lower visual field for the past two days. She has a hx of astigmatism in her right eye and has not been to see her eye doctor in years. She denies any foreign bodies/trauma. No eye redness, drainage,  itching, pain, or photophobia. She denies any headaches, head injury, weakness, loss of sensation, neck pain, lightheadedness, dizziness, or fevers. Wears glasses. No contacts.     Past Medical History  Diagnosis Date  . Anemia   . Bronchitis   . Asthma   . Obesity    History reviewed. No pertinent past surgical history. Family History  Problem Relation Age of Onset  . Diabetes Mother   . Diabetes Sister   . Hypertension Sister   . Hypertension Sister    History  Substance Use Topics  . Smoking status: Current Some Day Smoker -- 0.25 packs/day    Types: Cigarettes  . Smokeless tobacco: Not on file     Comment: 1-2 cigs per day  . Alcohol Use: No   OB History   Grav Para Term Preterm Abortions TAB SAB Ect Mult Living                 Review of Systems  Constitutional: Negative for fever, chills, activity change, appetite change and fatigue.  HENT: Negative for congestion, rhinorrhea and sore throat.   Eyes: Positive for visual disturbance. Negative for photophobia, pain, discharge, redness and itching.  Respiratory: Positive for cough, chest tightness, shortness of breath and wheezing.   Cardiovascular: Positive for palpitations and leg swelling. Negative for chest pain.  Gastrointestinal: Negative for nausea, vomiting, abdominal pain, diarrhea and constipation.  Genitourinary: Positive for dysuria, frequency and flank pain. Negative for hematuria, decreased urine volume, vaginal bleeding, vaginal discharge, difficulty urinating, vaginal pain,  menstrual problem and pelvic pain.  Musculoskeletal: Positive for back pain. Negative for arthralgias, gait problem, myalgias and neck pain.  Skin: Negative for color change and wound.  Neurological: Negative for dizziness, syncope, weakness, light-headedness, numbness and headaches.    Allergies  Review of patient's allergies indicates no known allergies.  Home Medications   Prior to Admission medications   Medication Sig Start  Date End Date Taking? Authorizing Provider  albuterol (PROVENTIL HFA;VENTOLIN HFA) 108 (90 BASE) MCG/ACT inhaler Inhale 2 puffs into the lungs every 6 (six) hours as needed for wheezing or shortness of breath. 03/06/14  Yes Posey Boyer, MD  albuterol (PROVENTIL) (2.5 MG/3ML) 0.083% nebulizer solution Take 3 mLs (2.5 mg total) by nebulization every 6 (six) hours as needed for wheezing or shortness of breath. 03/06/14  Yes Posey Boyer, MD  beclomethasone (QVAR) 80 MCG/ACT inhaler Inhale 2 puffs into the lungs 2 (two) times daily. 03/06/14  Yes Posey Boyer, MD   BP 149/81  Pulse 109  Temp(Src) 98.6 F (37 C) (Oral)  Resp 18  SpO2 96%  Filed Vitals:   04/09/14 1729 04/09/14 1900 04/09/14 2007 04/09/14 2154  BP: 149/81  136/60 127/57  Pulse: 109 94 93 84  Temp: 98.6 F (37 C)   98 F (36.7 C)  TempSrc: Oral   Oral  Resp: 18  18 19   SpO2: 96% 99% 96% 95%    Physical Exam  Nursing note and vitals reviewed. Constitutional: She is oriented to person, place, and time. She appears well-developed and well-nourished. No distress.  HENT:  Head: Normocephalic and atraumatic.  Right Ear: External ear normal.  Left Ear: External ear normal.  Nose: Nose normal.  Mouth/Throat: Oropharynx is clear and moist. No oropharyngeal exudate.  Eyes: Conjunctivae and EOM are normal. Pupils are equal, round, and reactive to light. Right eye exhibits no discharge. Left eye exhibits no discharge.  Fundoscopic exam:      The right eye shows no AV nicking, no exudate, no hemorrhage and no papilledema. The right eye shows no venous pulsations.  No pain with EOM's. No foreign bodies. No eyelid edema or masses. No eyelid drainage. Gross visual acuity intact. Pupils equal and reactive. No increased uptake on fluorescein Woods lamp exam on the right.   Neck: Normal range of motion. Neck supple.  No cervical lymphadenopathy. No nuchal rigidity.   Cardiovascular: Normal rate, regular rhythm, normal heart sounds  and intact distal pulses.  Exam reveals no gallop and no friction rub.   No murmur heard. Pulmonary/Chest: Effort normal. No respiratory distress. She has wheezes. She has no rales. She exhibits no tenderness.  Inspiratory and expiratory wheezing throughout. Decreased breath sounds throughout. Intermittent wet cough.   Abdominal: Soft. Bowel sounds are normal. She exhibits no distension. There is no tenderness. There is no rebound and no guarding.  Musculoskeletal: Normal range of motion. She exhibits tenderness. She exhibits no edema.  Left lower paraspinal and flank tenderness. No lumbar spinal tenderness. Strength 5/5 in the upper and lower extremities bilaterally. Patient able to ambulate without difficulty or ataxia. Minimal/trace pitting leg edema equal bilaterally.   Neurological: She is alert and oriented to person, place, and time.  GCS 15. No focal neurological deficits. CN 2-12 intact.   Skin: Skin is warm and dry. She is not diaphoretic.     ED Course  Procedures (including critical care time) Labs Review Labs Reviewed - No data to display  Imaging Review Dg Chest 2 View  04/09/2014  CLINICAL DATA:  Mid chest tightness, wheezing, shortness of breath.  EXAM: CHEST  2 VIEW  COMPARISON:  03/06/2014  FINDINGS: Cardiomediastinal contours are within normal range. Linear opacity within the perihilar right upper lung is favored to reflect atelectasis. No confluent airspace opacity, pleural effusion, or pneumothorax. No acute osseous finding.  IMPRESSION: Mild right perihilar/ upper lung atelectasis.   Electronically Signed   By: Carlos Levering M.D.   On: 04/09/2014 18:59     EKG Interpretation   Date/Time:  Tuesday April 09 2014 18:03:59 EDT Ventricular Rate:  95 PR Interval:  141 QRS Duration: 79 QT Interval:  333 QTC Calculation: 419 R Axis:   65 Text Interpretation:  Sinus rhythm Consider left atrial enlargement  Baseline wander in lead(s) I III aVL aVF V2 since last  tracing no  significant change Confirmed by Eulis Foster  MD, Vira Agar [phone]) on 04/09/2014  6:06:59 PM      Results for orders placed during the hospital encounter of 04/09/14  CBC WITH DIFFERENTIAL      Result Value Ref Range   WBC 13.4 (*) 4.0 - 10.5 K/uL   RBC 4.64  3.87 - 5.11 MIL/uL   Hemoglobin 9.5 (*) 12.0 - 15.0 g/dL   HCT 32.9 (*) 36.0 - 46.0 %   MCV 70.9 (*) 78.0 - 100.0 fL   MCH 20.5 (*) 26.0 - 34.0 pg   MCHC 28.9 (*) 30.0 - 36.0 g/dL   RDW 19.7 (*) 11.5 - 15.5 %   Platelets 323  150 - 400 K/uL   Neutrophils Relative % 70  43 - 77 %   Lymphocytes Relative 18  12 - 46 %   Monocytes Relative 9  3 - 12 %   Eosinophils Relative 3  0 - 5 %   Basophils Relative 0  0 - 1 %   Neutro Abs 9.4 (*) 1.7 - 7.7 K/uL   Lymphs Abs 2.4  0.7 - 4.0 K/uL   Monocytes Absolute 1.2 (*) 0.1 - 1.0 K/uL   Eosinophils Absolute 0.4  0.0 - 0.7 K/uL   Basophils Absolute 0.0  0.0 - 0.1 K/uL   Smear Review MORPHOLOGY UNREMARKABLE    BASIC METABOLIC PANEL      Result Value Ref Range   Sodium 137  137 - 147 mEq/L   Potassium 4.2  3.7 - 5.3 mEq/L   Chloride 98  96 - 112 mEq/L   CO2 28  19 - 32 mEq/L   Glucose, Bld 75  70 - 99 mg/dL   BUN 12  6 - 23 mg/dL   Creatinine, Ser 0.67  0.50 - 1.10 mg/dL   Calcium 8.9  8.4 - 10.5 mg/dL   GFR calc non Af Amer >90  >90 mL/min   GFR calc Af Amer >90  >90 mL/min   Anion gap 11  5 - 15  URINALYSIS, ROUTINE W REFLEX MICROSCOPIC      Result Value Ref Range   Color, Urine YELLOW  YELLOW   APPearance CLEAR  CLEAR   Specific Gravity, Urine 1.015  1.005 - 1.030   pH 7.0  5.0 - 8.0   Glucose, UA NEGATIVE  NEGATIVE mg/dL   Hgb urine dipstick NEGATIVE  NEGATIVE   Bilirubin Urine NEGATIVE  NEGATIVE   Ketones, ur NEGATIVE  NEGATIVE mg/dL   Protein, ur NEGATIVE  NEGATIVE mg/dL   Urobilinogen, UA 1.0  0.0 - 1.0 mg/dL   Nitrite NEGATIVE  NEGATIVE   Leukocytes, UA NEGATIVE  NEGATIVE  PREGNANCY,  URINE      Result Value Ref Range   Preg Test, Ur NEGATIVE  NEGATIVE      MDM   April Ellis is a 38 y.o. female with a PMH of anemia, bronchitis, asthma and obesity who presents to the ED for evaluation of SOB, dysuria, and eye problem. Patient has multiple complaints.   Patient complained of SOB, chest tightness and wheezing, which is likely due to asthma exacerbation vs bronchitis. Patient had wheezing throughout which cleared with Duoneb x 3 and prednisone. Symptoms also resolved with treatment. No hypoxia, respiratory distress, or tachypnea. Patient currently being evaluated by a pulmonologist as an OP for SOB. Had recent negative d-dimer. Has a sleep study and pulmonary function tests ordered. Follow-up appointment scheduled. SOB thought to be due to morbid obesity vs asthma vs sleep apnea vs tobacco use. Chest x-ray suggestive of a linear opacity which is favored to reflect atelectasis. Also leukocytosis (13.4), which may be due to acute bronchitis vs other developing lung infection. Will treat with Azithromycin. Also given prednisone and encouraged to continue home Albuterol. EKG negative for any acute ischemic changes. No complaints of chest pain. Encouraged to stop smoking.   Patient also complained of dysuria. UA negative for UTI or hematuria. No abdominal pain or tenderness. Left lower paraspinal back pain possibly musculoskeletal in nature. Worse with movement. No warning signs or symptoms of back pain. Creatinine WNL. Labs revealed stable anemia at baseline (9.5 and 32.9). Patient instructed to follow-up regarding this.   Patient also complained of a visual disturbance in her left lower field of vision in the right eye. No neurological deficits. No corneal abrasion or foreign body. No headache or other concerns. Patient has hx of astigmatism in the right eye. No evidence of a conjunctivitis, orbital cellulitis or infectious process. Patient instructed to follow-up with her eye doctor.   Return precautions, discharge instructions, and follow-up was  discussed with the patient before discharge.     Rechecks  7:00 PM = Visual Acuity - Bilateral Near: 20/40 ; R Near: 20/100 ; L Near: 20/50 7:35 PM = Patient states she feels better. Chest tightness and wheezing improved. Wheezing on lung exam. Re-ordering Duoneb.  9:00 PM = Patient continues to improve. Mild intermittent expiratory wheezing.  10:00 PM = Lungs clear to auscultation. No hypoxia. Patient states she feels well. No pain. Ready for discharge.     Discharge Medication List as of 04/09/2014 10:23 PM    START taking these medications   Details  azithromycin (ZITHROMAX) 250 MG tablet Take 1 tablet (250 mg total) by mouth daily. Take first 2 tablets together, then 1 every day until finished., Starting 04/09/2014, Until Discontinued, Print    predniSONE (DELTASONE) 20 MG tablet Take 2 tablets (40 mg total) by mouth daily., Starting 04/09/2014, Until Discontinued, Print        Final impressions: 1. Asthma exacerbation   2. Bronchitis   3. Anemia, unspecified anemia type   4. Right eye symptoms   5. Left-sided low back pain without sciatica      Mercy Moore PA-C   This patient was discussed with Dr. Marge Duncans, PA-C 04/10/14 1205

## 2014-04-11 NOTE — ED Provider Notes (Signed)
Medical screening examination/treatment/procedure(s) were performed by non-physician practitioner and as supervising physician I was immediately available for consultation/collaboration.   EKG Interpretation   Date/Time:  Tuesday April 09 2014 18:03:59 EDT Ventricular Rate:  95 PR Interval:  141 QRS Duration: 79 QT Interval:  333 QTC Calculation: 419 R Axis:   65 Text Interpretation:  Sinus rhythm Consider left atrial enlargement  Baseline wander in lead(s) I III aVL aVF V2 since last tracing no  significant change Confirmed by Eulis Foster  MD, ELLIOTT [phone]) on 04/09/2014  6:06:59 PM        Alfonzo Feller, DO 04/11/14 1449

## 2014-05-01 ENCOUNTER — Ambulatory Visit (INDEPENDENT_AMBULATORY_CARE_PROVIDER_SITE_OTHER): Payer: No Typology Code available for payment source | Admitting: Internal Medicine

## 2014-05-01 ENCOUNTER — Ambulatory Visit (INDEPENDENT_AMBULATORY_CARE_PROVIDER_SITE_OTHER): Payer: No Typology Code available for payment source | Admitting: Adult Health

## 2014-05-01 ENCOUNTER — Other Ambulatory Visit: Payer: No Typology Code available for payment source

## 2014-05-01 ENCOUNTER — Encounter: Payer: Self-pay | Admitting: Adult Health

## 2014-05-01 VITALS — BP 132/90 | HR 80 | Temp 97.8°F | Ht 62.0 in | Wt 360.0 lb

## 2014-05-01 DIAGNOSIS — J449 Chronic obstructive pulmonary disease, unspecified: Secondary | ICD-10-CM

## 2014-05-01 DIAGNOSIS — R0602 Shortness of breath: Secondary | ICD-10-CM

## 2014-05-01 LAB — PULMONARY FUNCTION TEST
DL/VA % pred: 125 %
DL/VA: 5.74 ml/min/mmHg/L
DLCO unc % pred: 93 %
DLCO unc: 20.38 ml/min/mmHg
FEF 25-75 Post: 0.81 L/sec
FEF 25-75 Pre: 0.66 L/sec
FEF2575-%Change-Post: 22 %
FEF2575-%PRED-POST: 28 %
FEF2575-%Pred-Pre: 23 %
FEV1-%Change-Post: 7 %
FEV1-%PRED-POST: 59 %
FEV1-%PRED-PRE: 55 %
FEV1-PRE: 1.35 L
FEV1-Post: 1.46 L
FEV1FVC-%Change-Post: 2 %
FEV1FVC-%PRED-PRE: 68 %
FEV6-%Change-Post: 5 %
FEV6-%Pred-Post: 85 %
FEV6-%Pred-Pre: 80 %
FEV6-Post: 2.46 L
FEV6-Pre: 2.32 L
FEV6FVC-%Change-Post: 0 %
FEV6FVC-%Pred-Post: 101 %
FEV6FVC-%Pred-Pre: 101 %
FVC-%Change-Post: 5 %
FVC-%PRED-PRE: 80 %
FVC-%Pred-Post: 84 %
FVC-POST: 2.47 L
FVC-Pre: 2.34 L
POST FEV1/FVC RATIO: 59 %
POST FEV6/FVC RATIO: 99 %
PRE FEV6/FVC RATIO: 99 %
Pre FEV1/FVC ratio: 58 %
RV % PRED: 161 %
RV: 2.32 L
TLC % PRED: 98 %
TLC: 4.73 L

## 2014-05-01 MED ORDER — MOMETASONE FURO-FORMOTEROL FUM 200-5 MCG/ACT IN AERO: 2.0000 | INHALATION_SPRAY | Freq: Two times a day (BID) | RESPIRATORY_TRACT | Status: DC

## 2014-05-01 NOTE — Progress Notes (Signed)
Subjective:    Patient ID: April Ellis, female    DOB: [birth year], 38 y.o.   MRN: [id number] April Cower, MD - pcp    HPI   IOV 04/01/2014  Chief Complaint  Patient presents with  . Pulmonary Consult    Referred by Dr. Ruben Reason for hypoxia   38 year old extremely moprbildy obese female with a body mass index of 70. Less than 5 pack smoking history since age 38. Which is trying to quit. Has a long-standing history of shortness of breath, chest tightness and cough that usually comes on after a cold but for the last several years possibly related to weight gain has dyspnea on exertion as well. Symptoms are relieved by prednisone orinhalers. But also by rest. Not really care about an allergy history. She is annual ER visits for exacerbations for which she gets prednisone that helps. Her history is positive for excessive daytime somnolence which she says runs in the family.  She has not formally been diagnosed with asthma or sleep apnea  She denies PE risk factors other than obesity     has a past medical history of Anemia; Bronchitis; Asthma; and Obesity.   has no past surgical history on file.   reports that she has been smoking Cigarettes.  She has been smoking about 0.25 packs per day. She does not have any smokeless tobacco history on file.  cxr 03/06/14 - clear  05/01/2014 Follow up PFT -SOB  MR pt. Pt here after PFT. Pt states her breathing has imporved slightly since last visit. Pt states she still has DOE and mild dry cough. Pt denies CP/tightness.  PFTs today showed FEV1 of 1.35 L, which was 55% predicted, ratio of 58, FVC 80%, normal diffusing capacity, no significant bronchodilator response Mid-flows were decreased at 23% with at 22%, change after bronchodilator CXR w/ right perihilar atx.  Smoked 1/2 38yr No FH of COPD .  No asthma as child . No premature birth.  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  No pets.  On QVAR  Pt states her breathing  has imporved slightly since last visit. Pt states she still has DOE and mild dry cough.  Pt denies CP/tightness. , edema orthopnea, n/v/d, gerd.  Uses SABA/couple times a week.  Uses albuterol neb 1-2 x daily .  Not able to exericse. Able to complete ADLS and work without difficulty.      Review of Systems  Constitutional: Negative for fever and unexpected weight change.  HENT: Negative for congestion, dental problem, ear pain, nosebleeds, postnasal drip, rhinorrhea, sinus pressure, sneezing, sore throat and trouble swallowing.   Eyes: Negative for redness and itching.  Respiratory: Positive for cough,   shortness of breath and wheezing.   Cardiovascular . Negative for leg swelling.  Gastrointestinal: Negative for nausea and vomiting.  Genitourinary: Negative for dysuria.  Musculoskeletal: Negative for joint swelling.  Skin: Negative for rash.  Neurological: Negative for headaches.  Hematological: Does not bruise/bleed easily.  Psychiatric/Behavioral: Negative for dysphoric mood. The patient is not nervous/anxious.        Objective:   Physical Exam  GEN: A/Ox3; pleasant , NAD, morbidly obese   HEENT:  [place]/AT,  EACs-clear, TMs-wnl, NOSE-clear, THROAT-clear, no lesions, no postnasal drip or exudate noted.   NECK:  Supple w/ fair ROM; no JVD; normal carotid impulses w/o bruits; no thyromegaly or nodules palpated; no lymphadenopathy.  RESP  Clear  P & A; w/o, wheezes/ rales/ or rhonchi.no accessory muscle use, no  dullness to percussion  CARD:  RRR, no m/r/g  , no peripheral edema, pulses intact, no cyanosis or clubbing.  GI:   Soft & nt; nml bowel sounds; no organomegaly or masses detected.  Musco: Warm bil, no deformities or joint swelling noted.   Neuro: alert, no focal deficits noted.    Skin: Warm, no lesions or rashes

## 2014-05-01 NOTE — Progress Notes (Signed)
PFT done today. 

## 2014-05-01 NOTE — Patient Instructions (Addendum)
Stop QVAR  Begin Dulera 200 2 puffs Twice daily  , rinse after use.  Must stop smoking.  follow up Dr. Chase Caller in 6 weeks and As needed   Please contact office for sooner follow up if symptoms do not improve or worsen or seek emergency care

## 2014-05-01 NOTE — Assessment & Plan Note (Addendum)
COPD Moderate /Gold II  ? Asthma component  Encouraged on wt loss and smoking cessation  Check alpha 1   Plan  Stop QVAR  Begin Dulera 200 2 puffs Twice daily  , rinse after use.  Must stop smoking.  follow up Dr. Chase Caller in 6 weeks and As needed   Please contact office for sooner follow up if symptoms do not improve or worsen or seek emergency care

## 2014-05-03 ENCOUNTER — Ambulatory Visit (INDEPENDENT_AMBULATORY_CARE_PROVIDER_SITE_OTHER): Payer: No Typology Code available for payment source | Admitting: Pulmonary Disease

## 2014-05-03 ENCOUNTER — Encounter: Payer: Self-pay | Admitting: Pulmonary Disease

## 2014-05-03 VITALS — BP 132/80 | HR 78 | Temp 98.6°F | Ht 62.0 in | Wt 361.4 lb

## 2014-05-03 DIAGNOSIS — G4733 Obstructive sleep apnea (adult) (pediatric): Secondary | ICD-10-CM

## 2014-05-03 NOTE — Patient Instructions (Signed)
Will schedule for home sleep testing, and will arrange followup once the results are available.  Work on weight loss  

## 2014-05-03 NOTE — Progress Notes (Signed)
Subjective:    Patient ID: April Ellis, female    DOB: 1976-05-02, 38 y.o.   MRN: [id number]  HPI The patient is a 38 year old female who I've been asked to see for possible obstructive sleep apnea. She has been noted to have loud snoring, but no one has commented on an abnormal breathing pattern during sleep. She denies choking arousals. She has frequent awakenings at night, and does not feel rested in the mornings upon arising. She has severe daytime sleepiness with any period of inactivity, and falls asleep easily in the evenings watching television. She has definite sleep pressure driving longer distances. The patient states that her weight is up 5-10 pounds over the last 2 years, and her Epworth score is abnormal at 21.   Sleep Questionnaire What time do you typically go to bed?( Between what hours) 11pm -1 am 11pm -1 am at 1610 on 05/03/14 by Lilli Few, CMA How long does it take you to fall asleep? 10 minutes 10 minutes at 1610 on 05/03/14 by Lilli Few, CMA How many times during the night do you wake up? 2 2 at 1610 on 05/03/14 by Lilli Few, CMA What time do you get out of bed to start your day? 0630 0630 at 1610 on 05/03/14 by Lilli Few, CMA Do you drive or operate heavy machinery in your occupation? No No at 1610 on 05/03/14 by Lilli Few, CMA How much has your weight changed (up or down) over the past two years? (In pounds) 10 lb (4.536 kg) 10 lb (4.536 kg) at 1610 on 05/03/14 by Lilli Few, CMA Have you ever had a sleep study before? No No at 1610 on 05/03/14 by Lilli Few, CMA Do you currently use CPAP? No No at 1610 on 05/03/14 by Lilli Few, CMA Do you wear oxygen at any time? No No at 1610 on 05/03/14 by Lilli Few, CMA   Review of Systems  Constitutional: Negative for fever and unexpected weight change.  HENT: Negative for congestion, dental problem, ear pain, nosebleeds,  postnasal drip, rhinorrhea, sinus pressure, sneezing, sore throat and trouble swallowing.   Eyes: Negative for redness and itching.  Respiratory: Positive for cough and shortness of breath. Negative for chest tightness and wheezing.   Cardiovascular: Positive for palpitations. Negative for leg swelling.  Gastrointestinal: Negative for nausea and vomiting.  Genitourinary: Negative for dysuria.  Musculoskeletal: Negative for joint swelling.  Skin: Negative for rash.  Neurological: Negative for headaches.  Hematological: Does not bruise/bleed easily.  Psychiatric/Behavioral: Negative for dysphoric mood. The patient is not nervous/anxious.        Objective:   Physical Exam Constitutional:  Morbidly obese female, no acute distress  HENT:  Nares patent without discharge, mild turbinate hypertrophy  Oropharynx without exudate, palate and uvula are thick and elongated  Eyes:  Perrla, eomi, no scleral icterus  Neck:  No JVD, no TMG  Cardiovascular:  Normal rate, regular rhythm, no rubs or gallops.  No murmurs        Intact distal pulses  Pulmonary :  Normal breath sounds, no stridor or respiratory distress   No rales, rhonchi, or wheezing  Abdominal:  Soft, nondistended, bowel sounds present.  No tenderness noted.   Musculoskeletal:  1+  lower extremity edema noted.  Lymph Nodes:  No cervical lymphadenopathy noted  Skin:  No cyanosis noted  Neurologic:  Alert, appropriate, moves all 4 extremities without obvious deficit.  Assessment & Plan:

## 2014-05-03 NOTE — Assessment & Plan Note (Signed)
The patient's history is very suggestive of clinically significant sleep apnea. I have had a long discussion with her about sleep apnea, including its impact to her quality of life and cardiovascular health. I have recommended a sleep study, and I think she is a good candidate for home sleep testing. The patient is agreeable to this approach

## 2014-05-08 LAB — ALPHA-1 ANTITRYPSIN PHENOTYPE: A-1 Antitrypsin: 148 mg/dL (ref 83–199)

## 2014-05-10 ENCOUNTER — Encounter: Payer: Self-pay | Admitting: Adult Health

## 2014-05-12 ENCOUNTER — Encounter (HOSPITAL_COMMUNITY): Payer: Self-pay | Admitting: Emergency Medicine

## 2014-05-12 ENCOUNTER — Inpatient Hospital Stay (HOSPITAL_COMMUNITY)
Admission: EM | Admit: 2014-05-12 | Discharge: 2014-05-14 | DRG: 189 | Disposition: A | Discharge status: Home / Self Care | Payer: No Typology Code available for payment source | Attending: Internal Medicine | Admitting: Internal Medicine

## 2014-05-12 ENCOUNTER — Emergency Department (HOSPITAL_COMMUNITY): Payer: No Typology Code available for payment source

## 2014-05-12 DIAGNOSIS — R030 Elevated blood-pressure reading, without diagnosis of hypertension: Secondary | ICD-10-CM | POA: Diagnosis present

## 2014-05-12 DIAGNOSIS — J9601 Acute respiratory failure with hypoxia: Secondary | ICD-10-CM | POA: Diagnosis present

## 2014-05-12 DIAGNOSIS — Z6841 Body Mass Index (BMI) 40.0 and over, adult: Secondary | ICD-10-CM

## 2014-05-12 DIAGNOSIS — J9600 Acute respiratory failure, unspecified whether with hypoxia or hypercapnia: Principal | ICD-10-CM | POA: Diagnosis present

## 2014-05-12 DIAGNOSIS — F17200 Nicotine dependence, unspecified, uncomplicated: Secondary | ICD-10-CM | POA: Diagnosis present

## 2014-05-12 DIAGNOSIS — J45901 Unspecified asthma with (acute) exacerbation: Secondary | ICD-10-CM

## 2014-05-12 DIAGNOSIS — Z720 Tobacco use: Secondary | ICD-10-CM | POA: Diagnosis present

## 2014-05-12 DIAGNOSIS — J441 Chronic obstructive pulmonary disease with (acute) exacerbation: Secondary | ICD-10-CM | POA: Diagnosis present

## 2014-05-12 LAB — CBC WITH DIFFERENTIAL/PLATELET
BASOS PCT: 0 % (ref 0–1)
Basophils Absolute: 0 10*3/uL (ref 0.0–0.1)
EOS ABS: 0.8 10*3/uL — AB (ref 0.0–0.7)
Eosinophils Relative: 6 % — ABNORMAL HIGH (ref 0–5)
HEMATOCRIT: 34.2 % — AB (ref 36.0–46.0)
HEMOGLOBIN: 9.6 g/dL — AB (ref 12.0–15.0)
LYMPHS ABS: 2 10*3/uL (ref 0.7–4.0)
Lymphocytes Relative: 17 % (ref 12–46)
MCH: 20.3 pg — AB (ref 26.0–34.0)
MCHC: 28.1 g/dL — ABNORMAL LOW (ref 30.0–36.0)
MCV: 72.3 fL — ABNORMAL LOW (ref 78.0–100.0)
MONO ABS: 0.6 10*3/uL (ref 0.1–1.0)
MONOS PCT: 5 % (ref 3–12)
NEUTROS ABS: 8.9 10*3/uL — AB (ref 1.7–7.7)
Neutrophils Relative %: 73 % (ref 43–77)
Platelets: 275 10*3/uL (ref 150–400)
RBC: 4.73 MIL/uL (ref 3.87–5.11)
RDW: 18.7 % — ABNORMAL HIGH (ref 11.5–15.5)
WBC Morphology: INCREASED
WBC: 12.3 10*3/uL — AB (ref 4.0–10.5)

## 2014-05-12 LAB — BASIC METABOLIC PANEL
Anion gap: 9 (ref 5–15)
BUN: 6 mg/dL (ref 6–23)
CALCIUM: 8.5 mg/dL (ref 8.4–10.5)
CHLORIDE: 102 meq/L (ref 96–112)
CO2: 27 meq/L (ref 19–32)
Creatinine, Ser: 0.73 mg/dL (ref 0.50–1.10)
GFR calc Af Amer: >90 mL/min (ref >90)
GFR calc non Af Amer: >90 mL/min (ref >90)
GLUCOSE: 106 mg/dL — AB (ref 70–99)
Potassium: 3.8 mEq/L (ref 3.7–5.3)
SODIUM: 138 meq/L (ref 137–147)

## 2014-05-12 MED ORDER — ALUM & MAG HYDROXIDE-SIMETH 200-200-20 MG/5ML PO SUSP: 30.0000 mL | Freq: Four times a day (QID) | ORAL | Status: DC | PRN

## 2014-05-12 MED ORDER — IPRATROPIUM-ALBUTEROL 0.5-2.5 (3) MG/3ML IN SOLN
3.0000 mL | Freq: Once | RESPIRATORY_TRACT | Status: AC
  Administered 2014-05-12: 3 mL via RESPIRATORY_TRACT
  Filled 2014-05-12: qty 3

## 2014-05-12 MED ORDER — SENNA 8.6 MG PO TABS
1.0000 | ORAL_TABLET | Freq: Two times a day (BID) | ORAL | Status: DC
  Administered 2014-05-12 – 2014-05-14 (×4): 8.6 mg via ORAL
  Filled 2014-05-12 (×4): qty 1

## 2014-05-12 MED ORDER — ALBUTEROL SULFATE (2.5 MG/3ML) 0.083% IN NEBU
2.5000 mg | INHALATION_SOLUTION | RESPIRATORY_TRACT | Status: DC | PRN
  Administered 2014-05-13 (×3): 2.5 mg via RESPIRATORY_TRACT
  Filled 2014-05-12 (×3): qty 3

## 2014-05-12 MED ORDER — ALBUTEROL SULFATE (2.5 MG/3ML) 0.083% IN NEBU
2.5000 mg | INHALATION_SOLUTION | Freq: Four times a day (QID) | RESPIRATORY_TRACT | Status: DC
  Administered 2014-05-13: 2.5 mg via RESPIRATORY_TRACT
  Filled 2014-05-12: qty 3

## 2014-05-12 MED ORDER — BISACODYL 5 MG PO TBEC: 5.0000 mg | DELAYED_RELEASE_TABLET | Freq: Every day | ORAL | Status: DC | PRN

## 2014-05-12 MED ORDER — MOMETASONE FURO-FORMOTEROL FUM 200-5 MCG/ACT IN AERO
2.0000 | INHALATION_SPRAY | Freq: Two times a day (BID) | RESPIRATORY_TRACT | Status: DC
  Administered 2014-05-13 – 2014-05-14 (×4): 2 via RESPIRATORY_TRACT
  Filled 2014-05-12: qty 8.8

## 2014-05-12 MED ORDER — HEPARIN SODIUM (PORCINE) 5000 UNIT/ML IJ SOLN
5000.0000 [IU] | Freq: Three times a day (TID) | INTRAMUSCULAR | Status: DC
  Administered 2014-05-12 – 2014-05-14 (×5): 5000 [IU] via SUBCUTANEOUS
  Filled 2014-05-12 (×8): qty 1

## 2014-05-12 MED ORDER — PANTOPRAZOLE SODIUM 40 MG PO TBEC
40.0000 mg | DELAYED_RELEASE_TABLET | Freq: Every day | ORAL | Status: DC
  Administered 2014-05-12 – 2014-05-14 (×3): 40 mg via ORAL
  Filled 2014-05-12 (×3): qty 1

## 2014-05-12 MED ORDER — METHYLPREDNISOLONE SODIUM SUCC 125 MG IJ SOLR
80.0000 mg | Freq: Four times a day (QID) | INTRAMUSCULAR | Status: DC
  Administered 2014-05-12 – 2014-05-13 (×2): 80 mg via INTRAVENOUS
  Filled 2014-05-12 (×6): qty 1.28

## 2014-05-12 MED ORDER — LEVOFLOXACIN IN D5W 750 MG/150ML IV SOLN
750.0000 mg | INTRAVENOUS | Status: DC
  Administered 2014-05-12 – 2014-05-13 (×2): 750 mg via INTRAVENOUS
  Filled 2014-05-12 (×2): qty 150

## 2014-05-12 MED ORDER — PREDNISONE 20 MG PO TABS
60.0000 mg | ORAL_TABLET | Freq: Once | ORAL | Status: AC
  Administered 2014-05-12: 60 mg via ORAL
  Filled 2014-05-12: qty 3

## 2014-05-12 MED ORDER — ACETAMINOPHEN 650 MG RE SUPP: 650.0000 mg | Freq: Four times a day (QID) | RECTAL | Status: DC | PRN

## 2014-05-12 MED ORDER — IPRATROPIUM BROMIDE 0.02 % IN SOLN
0.5000 mg | Freq: Four times a day (QID) | RESPIRATORY_TRACT | Status: DC
  Administered 2014-05-13: 0.5 mg via RESPIRATORY_TRACT
  Filled 2014-05-12: qty 2.5

## 2014-05-12 MED ORDER — CETYLPYRIDINIUM CHLORIDE 0.05 % MT LIQD
7.0000 mL | Freq: Two times a day (BID) | OROMUCOSAL | Status: DC
  Administered 2014-05-13 – 2014-05-14 (×4): 7 mL via OROMUCOSAL

## 2014-05-12 MED ORDER — ACETAMINOPHEN 325 MG PO TABS: 650.0000 mg | ORAL_TABLET | Freq: Four times a day (QID) | ORAL | Status: DC | PRN

## 2014-05-12 MED ORDER — ALBUTEROL (5 MG/ML) CONTINUOUS INHALATION SOLN
10.0000 mg/h | INHALATION_SOLUTION | RESPIRATORY_TRACT | Status: AC
  Administered 2014-05-12: 10 mg/h via RESPIRATORY_TRACT
  Filled 2014-05-12: qty 20

## 2014-05-12 MED ORDER — GUAIFENESIN ER 600 MG PO TB12
600.0000 mg | ORAL_TABLET | Freq: Two times a day (BID) | ORAL | Status: DC
  Administered 2014-05-12 – 2014-05-14 (×4): 600 mg via ORAL
  Filled 2014-05-12 (×5): qty 1

## 2014-05-12 NOTE — ED Notes (Signed)
Patient oxygen level dropped down to 72 when ambulating PA made aware

## 2014-05-12 NOTE — H&P (Signed)
Patient Demographics  April Ellis, is a 38 y.o. female  MRN: [id number]   DOB - Oct 27, 1975  Admit Date - 05/12/2014  Outpatient Primary MD for the patient is [name], MD   With History of -  Past Medical History  Diagnosis Date  . Anemia   . Bronchitis   . Asthma   . Obesity       Past Surgical History  Procedure Laterality Date  . No past surgeries      in for   Chief Complaint  Patient presents with  . Shortness of Breath     HPI  April Ellis  is a 38 y.o. female, who presents with complaints of shortness of breath for the last 24 hours, as well complaints of cough with productive sputum, reports she has used her nebulizer treatment multiple times without much help, which prompted her to come to ED, patient reports she was never hospitalized in the past for COPD exacerbation, but reports she was treated as an outpatient with by mouth prednisone and antibiotics, patient is being followed by [facility] as an outpatient, where she carries a diagnosis of COPD stage II with asthma component, as well being actively workup for obstructive sleep apnea and has an appointment with Dr. Chase Caller in few weeks, patient given prednisone and IV levofloxacin in ED, patient was hypoxic at 82% upon presentation on room air, after treatment she still desaturated upon ambulation well her O2 sat dropped to 72%, so hospitalist requested to admit the patient, she denies any fever, chills, chest pain, altered mental status, loss of consciousness, hemoptysis or calf tenderness.  Patient denies any orthopnea, or worsening lower extremity edema, her chest x-ray showing possible vascular congestion with cardiomegaly.  Review of Systems    In addition to the HPI above,  No Fever-chills, No Headache, No changes with  Vision or hearing, No problems swallowing food or Liquids, No Chest pain, reports Cough and Shortness of Breath not relieved by nebulizer treatment. No Abdominal pain, No Nausea or Vommitting, Bowel movements are regular, No Blood in stool or Urine, No dysuria, No new skin rashes or bruises, No new joints pains-aches,  No new weakness, tingling, numbness in any extremity, No recent weight gain or loss, No polyuria, polydypsia or polyphagia, No significant Mental Stressors.  A full 10 point Review of Systems was done, except as stated above, all other Review of Systems were negative.   Social History History  Substance Use Topics  . Smoking status: Current Some Day Smoker -- 0.25 packs/day for 15 years    Types: Cigarettes  . Smokeless tobacco: Not on file     Comment: 1-2 cigs per day  . Alcohol Use: No     Family History Family History  Problem Relation Age of Onset  . Diabetes Mother   . Diabetes Sister   . Hypertension Sister   . Hypertension Sister      Prior to Admission medications  Medication Sig Start Date End Date Taking? Authorizing Provider  albuterol (PROVENTIL HFA;VENTOLIN HFA) 108 (90 BASE) MCG/ACT inhaler Inhale 2 puffs into the lungs every 6 (six) hours as needed for wheezing or shortness of breath. 03/06/14  Yes Posey Boyer, MD  albuterol (PROVENTIL) (2.5 MG/3ML) 0.083% nebulizer solution Take 3 mLs (2.5 mg total) by nebulization every 6 (six) hours as needed for wheezing or shortness of breath. 03/06/14  Yes Posey Boyer, MD  mometasone-formoterol [facility]) 200-5 MCG/ACT AERO Inhale 2 puffs into the lungs 2 (two) times daily. 05/01/14  Yes Tammy S Parrett, NP    No Known Allergies  Physical Exam  Vitals  Blood pressure 145/77, pulse 108, temperature 98.1 F (36.7 C), temperature source Oral, resp. rate 24, last menstrual period 04/27/2014, SpO2 93.00%.   1. General morbidly obese female lying in bed in NAD,   2. Normal affect and insight, Not  Suicidal or Homicidal, Awake Alert, Oriented X 3.  3. No F.N deficits, ALL C.Nerves Intact, Strength 5/5 all 4 extremities, Sensation intact all 4 extremities, Plantars down going.  4. Ears and Eyes appear Normal, Conjunctivae clear, PERRLA. Moist Oral Mucosa.  5. Supple Neck, No JVD, No cervical lymphadenopathy appriciated, No Carotid Bruits.  6. Symmetrical Chest wall movement, Good air movement bilaterally, scattered bilateral wheezing.  7. RRR, No Gallops, Rubs or Murmurs, No Parasternal Heave.  8. Positive Bowel Sounds, Abdomen Soft, No tenderness, No organomegaly appriciated,No rebound -guarding or rigidity.  9.  No Cyanosis, Normal Skin Turgor, No Skin Rash or Bruise.  10. Good muscle tone,  joints appear normal , no effusions, Normal ROM.  11. No Palpable Lymph Nodes in Neck or Axillae    Data Review  CBC  Recent Labs Lab 05/12/14 1927  WBC 12.3*  HGB 9.6*  HCT 34.2*  PLT 275  MCV 72.3*  MCH 20.3*  MCHC 28.1*  RDW 18.7*  LYMPHSABS 2.0  MONOABS 0.6  EOSABS 0.8*  BASOSABS 0.0   ------------------------------------------------------------------------------------------------------------------  Chemistries   Recent Labs Lab 05/12/14 1927  NA 138  K 3.8  CL 102  CO2 27  GLUCOSE 106*  BUN 6  CREATININE 0.73  CALCIUM 8.5   ------------------------------------------------------------------------------------------------------------------ CrCl is unknown because both a height and weight (above a minimum accepted value) are required for this calculation. ------------------------------------------------------------------------------------------------------------------ No results found for this basename: TSH, T4TOTAL, FREET3, T3FREE, THYROIDAB,  in the last 72 hours   Coagulation profile No results found for this basename: INR, PROTIME,  in the last 168  hours ------------------------------------------------------------------------------------------------------------------- No results found for this basename: DDIMER,  in the last 72 hours -------------------------------------------------------------------------------------------------------------------  Cardiac Enzymes No results found for this basename: CK, CKMB, TROPONINI, MYOGLOBIN,  in the last 168 hours ------------------------------------------------------------------------------------------------------------------ No components found with this basename: POCBNP,    ---------------------------------------------------------------------------------------------------------------  Urinalysis    Component Value Date/Time   COLORURINE YELLOW 04/09/2014 2007   APPEARANCEUR CLEAR 04/09/2014 2007   LABSPEC 1.015 04/09/2014 2007   PHURINE 7.0 04/09/2014 2007   GLUCOSEU NEGATIVE 04/09/2014 2007   GLUCOSEU NEGATIVE 12/19/2008 [place] 04/09/2014 2007   [place] 04/09/2014 2007   [place] 04/09/2014 2007   PROTEINUR NEGATIVE 04/09/2014 2007   UROBILINOGEN 1.0 04/09/2014 2007   NITRITE NEGATIVE 04/09/2014 2007   LEUKOCYTESUR NEGATIVE 04/09/2014 2007    ----------------------------------------------------------------------------------------------------------------  Imaging results:   Dg Chest 2 View  05/12/2014   CLINICAL DATA:  Shortness of breath.  EXAM: CHEST  2 VIEW  COMPARISON:  04/09/2014  FINDINGS:  There is slight cardiomegaly with slight pulmonary vascular congestion as indicated by distention of the azygos vein. No infiltrates or effusions. No osseous abnormality.  IMPRESSION: New slight cardiomegaly with slight pulmonary vascular congestion.   Electronically Signed   By: Rozetta Nunnery M.D.   On: 05/12/2014 18:18       Assessment & Plan  Active Problems:   COPD exacerbation   Acute respiratory failure with hypoxia   Tobacco abuse    1. COPD  exacerbation: The patient presents with shortness of breath cough, wheezing, productive sputum, hypoxic on room air, secondary to COPD exacerbation, she will be admitted for IV steroids and IV antibiotics, will start her on IV Solu-Medrol 80 every 6 hours, duo nebs every 6 hours, when necessary albuterol, continue with Dulera, and given her productive sputum will continue with IV levofloxacin.  2. acute hypoxic respiratory failure: This is most likely related to her COPD exacerbation, will keep her on when necessary oxygen for target saturation less than 95%, and will consult social worker as she may need oxygen upon discharge, and given her findings of cardiomegaly and possible viral vascular congestion on the chest x-ray will request 2-D echo to evaluate for ejection fraction or diastolic dysfunction, even though she does not appear to be having any clinical CHF picture, will add pro BNP labs in the morning.  3. Tobacco abuse: Patient was counseled at length on her smoking, currently she does not want any nicotine patches.   DVT Prophylaxis Heparin   AM Labs Ordered, also please review Full Orders  Family Communication: Admission, patients condition and plan of care including tests being ordered have been discussed with the patient and family who indicate understanding and agree with the plan and Code Status.  Code Status full  Likely DC to  home  Condition GUARDED    Time spent in minutes : 50 minutes    Dorlis Judice M.D on 05/12/2014 at 9:01 PM      [facility] Office  [phone]   **Disclaimer: This note may have been dictated with voice recognition software. Similar sounding words can inadvertently be transcribed and this note may contain transcription errors which may not have been corrected upon publication of note.**

## 2014-05-12 NOTE — ED Notes (Signed)
Patient transported to X-ray 

## 2014-05-12 NOTE — ED Provider Notes (Signed)
CSN: [id number]     Arrival date & time 05/12/14  1715 History   First MD Initiated Contact with Patient 05/12/14 1735     Chief Complaint  Patient presents with  . Shortness of Breath     (Consider location/radiation/quality/duration/timing/severity/associated sxs/prior Treatment) HPI Comments: Patient is a 38 year old female with history of COPD with asthma, obesity who presents the emergency department shortness of breath. She reports that this is gradually worsening and she woke up this morning. She has audible wheezes. He has used her rescue inhaler multiple times today without relief. She was seen by [facility] pulmonology on both 8/5 and 8/7 for evaluation of her respiratory issues. Her diagnosis of COPD is new, over the past 3 months. She was diagnosed with sleep apnea as well. She reports that the current shortness of breath feels like a "average" as the exacerbation. She has never been admitted in the past for her asthma. She has developed a new cough today. She has had associated chills, but she is unsure if this is a shakiness from her albuterol. No known fever, chest pain, nausea, vomiting, abdominal pain.  Patient is a 38 y.o. female presenting with shortness of breath. The history is provided by the patient. No language interpreter was used.  Shortness of Breath Associated symptoms: cough   Associated symptoms: no abdominal pain, no chest pain, no ear pain, no fever and no vomiting     Past Medical History  Diagnosis Date  . Anemia   . Bronchitis   . Asthma   . Obesity    Past Surgical History  Procedure Laterality Date  . No past surgeries     Family History  Problem Relation Age of Onset  . Diabetes Mother   . Diabetes Sister   . Hypertension Sister   . Hypertension Sister    History  Substance Use Topics  . Smoking status: Current Some Day Smoker -- 0.25 packs/day for 15 years    Types: Cigarettes  . Smokeless tobacco: Not on file     Comment: 1-2 cigs per day   . Alcohol Use: No   OB History   Grav Para Term Preterm Abortions TAB SAB Ect Mult Living                 Review of Systems  Constitutional: Positive for chills. Negative for fever.  HENT: Negative for ear pain.   Respiratory: Positive for cough, chest tightness and shortness of breath.   Cardiovascular: Negative for chest pain and leg swelling.  Gastrointestinal: Negative for nausea, vomiting and abdominal pain.  All other systems reviewed and are negative.     Allergies  Review of patient's allergies indicates no known allergies.  Home Medications   Prior to Admission medications   Medication Sig Start Date End Date Taking? Authorizing Provider  albuterol (PROVENTIL HFA;VENTOLIN HFA) 108 (90 BASE) MCG/ACT inhaler Inhale 2 puffs into the lungs every 6 (six) hours as needed for wheezing or shortness of breath. 03/06/14   Posey Boyer, MD  albuterol (PROVENTIL) (2.5 MG/3ML) 0.083% nebulizer solution Take 3 mLs (2.5 mg total) by nebulization every 6 (six) hours as needed for wheezing or shortness of breath. 03/06/14   Posey Boyer, MD  mometasone-formoterol [facility]) 200-5 MCG/ACT AERO Inhale 2 puffs into the lungs 2 (two) times daily. 05/01/14   Tammy S Parrett, NP   BP 145/77  Pulse 108  Temp(Src) 98.1 F (36.7 C) (Oral)  Resp 24  SpO2 97%  LMP 04/27/2014 Physical  Exam  Nursing note and vitals reviewed. Constitutional: She is oriented to person, place, and time. She appears well-developed and well-nourished. No distress.  Morbidly obese  HENT:  Head: Normocephalic and atraumatic.  Right Ear: Tympanic membrane, external ear and ear canal normal.  Left Ear: Tympanic membrane, external ear and ear canal normal.  Nose: Nose normal.  Mouth/Throat: Oropharynx is clear and moist.  Eyes: Conjunctivae are normal.  Neck: Normal range of motion.  Cardiovascular: Normal rate, regular rhythm and normal heart sounds.   Pulmonary/Chest: Effort normal. No stridor. No respiratory  distress. She has wheezes (diffuse, inspiratory and expiratory). She has rhonchi. She has no rales.  Speaking in full sentences  Abdominal: Soft. She exhibits no distension.  Musculoskeletal: Normal range of motion.  Neurological: She is alert and oriented to person, place, and time. She has normal strength.  Skin: Skin is warm and dry. She is not diaphoretic. No erythema.  Psychiatric: She has a normal mood and affect. Her behavior is normal.    ED Course  Procedures (including critical care time) Labs Review Labs Reviewed  CBC WITH DIFFERENTIAL - Abnormal; Notable for the following:    WBC 12.3 (*)    Hemoglobin 9.6 (*)    HCT 34.2 (*)    MCV 72.3 (*)    MCH 20.3 (*)    MCHC 28.1 (*)    RDW 18.7 (*)    Neutro Abs 8.9 (*)    Eosinophils Relative 6 (*)    Eosinophils Absolute 0.8 (*)    All other components within normal limits  BASIC METABOLIC PANEL - Abnormal; Notable for the following:    Glucose, Bld 106 (*)    All other components within normal limits  CULTURE, BLOOD (ROUTINE X 2)  CULTURE, BLOOD (ROUTINE X 2)  CBC  CREATININE, SERUM  BASIC METABOLIC PANEL  CBC    Imaging Review Dg Chest 2 View  05/12/2014   CLINICAL DATA:  Shortness of breath.  EXAM: CHEST  2 VIEW  COMPARISON:  04/09/2014  FINDINGS: There is slight cardiomegaly with slight pulmonary vascular congestion as indicated by distention of the azygos vein. No infiltrates or effusions. No osseous abnormality.  IMPRESSION: New slight cardiomegaly with slight pulmonary vascular congestion.   Electronically Signed   By: Rozetta Nunnery M.D.   On: 05/12/2014 18:18     EKG Interpretation   Date/Time:  Sunday May 12 2014 18:04:30 EDT Ventricular Rate:  92 PR Interval:  143 QRS Duration: 89 QT Interval:  356 QTC Calculation: 440 R Axis:   58 Text Interpretation:  Sinus rhythm Baseline wander in lead(s) V2 V3 V4 V5  No significant change since last tracing Confirmed by KNAPP  MD-J, JON  ([id number]) on 05/12/2014  6:09:55 PM      MDM   Final diagnoses:  COPD exacerbation  Acute respiratory failure with hypoxia  Tobacco abuse    Patient presents to ED with worsening shortness of breath since earlier today. Patient with diffuse wheezing. Lung exam improved after CAT and prednisone. Patient was ambulated with pulse ox and dropped her oxygen saturation into the 70s. She felt very short of breath while walking. Decision was made to admit. She was started on Levaquin for COPD exacerbation. CBC resulted and shows > 20% bands. Blood cultures ordered. Patient is hemodynamically stable and speaking in full sentences at this time. Admission is appreciated. Discussed case with Dr. Tomi Bamberger who agrees with plan. Patient / Family / Caregiver informed of clinical course, understand medical decision-making  process, and agree with plan.    Elwyn Lade, PA-C 05/12/14 2239

## 2014-05-12 NOTE — Progress Notes (Signed)
ANTIBIOTIC CONSULT NOTE - INITIAL  Pharmacy Consult for Levaquin Indication: COPD exacerbation  No Known Allergies  Patient Measurements:   Total Body Weight: 164kg  Vital Signs: Temp: 98.1 F (36.7 C) (08/16 1725) Temp src: Oral (08/16 1725) BP: 145/77 mmHg (08/16 1725) Pulse Rate: 108 (08/16 1725) Intake/Output from previous day:   Intake/Output from this shift:    Labs: No results found for this basename: WBC, HGB, PLT, LABCREA, CREATININE,  in the last 72 hours The CrCl is unknown because both a height and weight (above a minimum accepted value) are required for this calculation. No results found for this basename: VANCOTROUGH, VANCOPEAK, VANCORANDOM, GENTTROUGH, GENTPEAK, GENTRANDOM, TOBRATROUGH, TOBRAPEAK, TOBRARND, AMIKACINPEAK, AMIKACINTROU, AMIKACIN,  in the last 72 hours   Microbiology: No results found for this or any previous visit (from the past 720 hour(s)).  Medical History: Past Medical History  Diagnosis Date  . Anemia   . Bronchitis   . Asthma   . Obesity    Medications:  Scheduled:   Anti-infectives   Start     Dose/Rate Route Frequency Ordered Stop   05/12/14 2000  levofloxacin (LEVAQUIN) IVPB 750 mg     750 mg 100 mL/hr over 90 Minutes Intravenous Every 24 hours 05/12/14 1921       Assessment: 56 yoF with SHOB, hx of Asthma, probable COPD exacerbation, Levaquin per pharmacy. Patient obese, presented with O2 sats 82%, improved with O2.   Clearance wnl, no need for dose adjustment  Goal of Therapy:  Abx dose/schedule appropriate for renal function, clinical course  Plan:   Levaquin 750mg  IV q24  Consider change to po abx when able  No further notes necessary unless renal function severely compromised  Thank you, Minda Ditto PharmD Pager [phone] 05/12/2014, 7:26 PM    Nyoka Cowden, Karna Christmas L 05/12/2014,7:20 PM

## 2014-05-12 NOTE — ED Notes (Addendum)
Pt presents with complaint of shortness of breath, which she has a history of asthma. Pt has audible wheezing. Pt was seen by [facility] last week. Pt is speaking in full sentences, however presents with an oxygen saturation of 82% on room air. Nasal Cannula applied and now has an oxygen saturation of 96% on 4L.  Pt denies chest pain.

## 2014-05-13 DIAGNOSIS — I059 Rheumatic mitral valve disease, unspecified: Secondary | ICD-10-CM

## 2014-05-13 LAB — CBC
HEMATOCRIT: 33.8 % — AB (ref 36.0–46.0)
HEMOGLOBIN: 9.7 g/dL — AB (ref 12.0–15.0)
MCH: 20.3 pg — AB (ref 26.0–34.0)
MCHC: 28.7 g/dL — ABNORMAL LOW (ref 30.0–36.0)
MCV: 70.7 fL — ABNORMAL LOW (ref 78.0–100.0)
PLATELETS: 271 10*3/uL (ref 150–400)
RBC: 4.78 MIL/uL (ref 3.87–5.11)
RDW: 18.9 % — ABNORMAL HIGH (ref 11.5–15.5)
WBC: 10.7 10*3/uL — AB (ref 4.0–10.5)

## 2014-05-13 LAB — BASIC METABOLIC PANEL
Anion gap: 11 (ref 5–15)
BUN: 7 mg/dL (ref 6–23)
CO2: 24 meq/L (ref 19–32)
Calcium: 8.7 mg/dL (ref 8.4–10.5)
Chloride: 103 mEq/L (ref 96–112)
Creatinine, Ser: 0.7 mg/dL (ref 0.50–1.10)
GFR calc Af Amer: >90 mL/min (ref >90)
GLUCOSE: 167 mg/dL — AB (ref 70–99)
POTASSIUM: 4.1 meq/L (ref 3.7–5.3)
SODIUM: 138 meq/L (ref 137–147)

## 2014-05-13 MED ORDER — IPRATROPIUM-ALBUTEROL 0.5-2.5 (3) MG/3ML IN SOLN
3.0000 mL | Freq: Three times a day (TID) | RESPIRATORY_TRACT | Status: DC
  Administered 2014-05-13 – 2014-05-14 (×3): 3 mL via RESPIRATORY_TRACT
  Filled 2014-05-13 (×3): qty 3

## 2014-05-13 MED ORDER — METHYLPREDNISOLONE SODIUM SUCC 125 MG IJ SOLR
80.0000 mg | Freq: Three times a day (TID) | INTRAMUSCULAR | Status: DC
  Administered 2014-05-13 – 2014-05-14 (×4): 80 mg via INTRAVENOUS
  Filled 2014-05-13 (×6): qty 1.28

## 2014-05-13 MED ORDER — IPRATROPIUM-ALBUTEROL 0.5-2.5 (3) MG/3ML IN SOLN
3.0000 mL | Freq: Four times a day (QID) | RESPIRATORY_TRACT | Status: DC
  Administered 2014-05-13: 3 mL via RESPIRATORY_TRACT
  Filled 2014-05-13: qty 3

## 2014-05-13 MED ORDER — NICOTINE 7 MG/24HR TD PT24
7.0000 mg | MEDICATED_PATCH | Freq: Every day | TRANSDERMAL | Status: DC
  Administered 2014-05-13: 7 mg via TRANSDERMAL
  Filled 2014-05-13 (×2): qty 1

## 2014-05-13 NOTE — Progress Notes (Signed)
Clinical Social Work  CSW received inappropriate referral to assist with home oxygen needs. CSW informed CM of possible needs. CSW is signing off but available if further needs arise.  [place], [place] [phone]

## 2014-05-13 NOTE — Progress Notes (Signed)
Echocardiogram 2D Echocardiogram has been performed.  April Ellis 05/13/2014, 9:50 AM

## 2014-05-13 NOTE — Progress Notes (Signed)
Pt. Arrived in room 1507 @ 2210 via stretcher accompanied by a tech, She is A/V/Ox4, very pleasant and can make needs known. O2 via [place] @ 2LPM in use. Denies pain but c/o SOB, No apparent distress ,Encouraged deep breathing technique. Skin W/D/I. O2 sat at the moment is 94% at rest

## 2014-05-13 NOTE — Progress Notes (Signed)
[facility] PROGRESS NOTE  April Ellis XBM:[id number] DOB: 25-Sep-1976 DOA: 05/12/2014 PCP: [name], MD  Assessment/Plan: #1 acute hypoxic respiratory failure Likely secondary to COPD exacerbation. Patient with clinical improvement. Chest x-ray had cardiomegaly and mild vascular congestion however 2-D echo at 80 of a 55-60% with no wall motion abnormalities. Continue oxygen, scheduled nebulizers, IV Levaquin, change IV Solu-Medrol to every 8 hours. Follow.  #2 acute COPD exacerbation Questionable trigger. Patient with clinical improvement. Continue oxygen, scheduled nebulizers, IV steroid taper, IV Levaquin. Follow.  #3 tobacco abuse Tobacco cessation. Patient did not want any nicotine patches.  #4 prophylaxis Heparin for DVT prophylaxis.  Code Status: Full Family Communication: Updated patient no family present. Disposition Plan: Home when medically stable hopefully one to 2 days   Consultants:  None  Procedures:  2-D echo 05/13/2014  Chest x-ray 05/12/2014    Antibiotics:  IV Levaquin 05/12/2014  HPI/Subjective: Patient states she's feeling a lot better. Breathing has improved.  Objective: Filed Vitals:   05/13/14 0536  BP: 135/65  Pulse: 75  Temp: 97.9 F (36.6 C)  Resp: 16    Intake/Output Summary (Last 24 hours) at 05/13/14 1158 Last data filed at 05/13/14 0900  Gross per 24 hour  Intake    240 ml  Output      0 ml  Net    240 ml   Filed Weights   05/12/14 2210  Weight: 165.5 kg (364 lb 13.8 oz)    Exam:   General:  NAD  Cardiovascular: RRR  Respiratory: Minimal expiratory wheezing. Fair movement.  Abdomen: Soft, nontender, nondistended, positive bowel sounds  Musculoskeletal: No clubbing cyanosis or edema  Data Reviewed: Basic Metabolic Panel:  Recent Labs Lab 05/12/14 1927 05/13/14 0453  NA 138 138  K 3.8 4.1  CL 102 103  CO2 27 24  GLUCOSE 106* 167*  BUN 6 7  CREATININE 0.73 0.70  CALCIUM 8.5 8.7   Liver  Function Tests: No results found for this basename: AST, ALT, ALKPHOS, BILITOT, PROT, ALBUMIN,  in the last 168 hours No results found for this basename: LIPASE, AMYLASE,  in the last 168 hours No results found for this basename: AMMONIA,  in the last 168 hours CBC:  Recent Labs Lab 05/12/14 1927 05/13/14 0453  WBC 12.3* 10.7*  NEUTROABS 8.9*  --   HGB 9.6* 9.7*  HCT 34.2* 33.8*  MCV 72.3* 70.7*  PLT 275 271   Cardiac Enzymes: No results found for this basename: CKTOTAL, CKMB, CKMBINDEX, TROPONINI,  in the last 168 hours BNP (last 3 results) No results found for this basename: PROBNP,  in the last 8760 hours CBG: No results found for this basename: GLUCAP,  in the last 168 hours  No results found for this or any previous visit (from the past 240 hour(s)).   Studies: Dg Chest 2 View  05/12/2014   CLINICAL DATA:  Shortness of breath.  EXAM: CHEST  2 VIEW  COMPARISON:  04/09/2014  FINDINGS: There is slight cardiomegaly with slight pulmonary vascular congestion as indicated by distention of the azygos vein. No infiltrates or effusions. No osseous abnormality.  IMPRESSION: New slight cardiomegaly with slight pulmonary vascular congestion.   Electronically Signed   By: Rozetta Nunnery M.D.   On: 05/12/2014 18:18    Scheduled Meds: . antiseptic oral rinse  7 mL Mouth Rinse BID  . guaiFENesin  600 mg Oral BID  . heparin  5,000 Units Subcutaneous 3 times per day  . ipratropium-albuterol  3  mL Nebulization Q6H  . levofloxacin (LEVAQUIN) IV  750 mg Intravenous Q24H  . methylPREDNISolone (SOLU-MEDROL) injection  80 mg Intravenous Q6H  . mometasone-formoterol  2 puff Inhalation BID  . pantoprazole  40 mg Oral Daily  . senna  1 tablet Oral BID   Continuous Infusions:   Principal Problem:   Acute respiratory failure with hypoxia Active Problems:   Morbid obesity   ELEVATED BLOOD PRESSURE WITHOUT DIAGNOSIS OF HYPERTENSION   COPD exacerbation   Tobacco abuse    Time spent: 84  mins    [facility] MD [facility] Pager [phone]. If 7PM-7AM, please contact night-coverage at [email or web address], password [facility] 05/13/2014, 11:58 AM  LOS: 1 day

## 2014-05-14 LAB — CBC
HCT: 34.7 % — ABNORMAL LOW (ref 36.0–46.0)
Hemoglobin: 10.1 g/dL — ABNORMAL LOW (ref 12.0–15.0)
MCH: 20.3 pg — AB (ref 26.0–34.0)
MCHC: 29.1 g/dL — AB (ref 30.0–36.0)
MCV: 69.8 fL — AB (ref 78.0–100.0)
Platelets: 323 10*3/uL (ref 150–400)
RBC: 4.97 MIL/uL (ref 3.87–5.11)
RDW: 18.9 % — AB (ref 11.5–15.5)
WBC: 11.8 10*3/uL — ABNORMAL HIGH (ref 4.0–10.5)

## 2014-05-14 LAB — BASIC METABOLIC PANEL
ANION GAP: 10 (ref 5–15)
BUN: 13 mg/dL (ref 6–23)
CALCIUM: 9.2 mg/dL (ref 8.4–10.5)
CHLORIDE: 101 meq/L (ref 96–112)
CO2: 25 mEq/L (ref 19–32)
Creatinine, Ser: 0.67 mg/dL (ref 0.50–1.10)
GFR calc Af Amer: >90 mL/min (ref >90)
GFR calc non Af Amer: >90 mL/min (ref >90)
GLUCOSE: 138 mg/dL — AB (ref 70–99)
Potassium: 4.7 mEq/L (ref 3.7–5.3)
Sodium: 136 mEq/L — ABNORMAL LOW (ref 137–147)

## 2014-05-14 MED ORDER — GUAIFENESIN ER 600 MG PO TB12: 1200.0000 mg | ORAL_TABLET | Freq: Two times a day (BID) | ORAL | Status: DC

## 2014-05-14 MED ORDER — ALBUTEROL SULFATE (2.5 MG/3ML) 0.083% IN NEBU: 2.5000 mg | INHALATION_SOLUTION | Freq: Four times a day (QID) | RESPIRATORY_TRACT | Status: DC | PRN

## 2014-05-14 MED ORDER — LEVOFLOXACIN 500 MG PO TABS
500.0000 mg | ORAL_TABLET | Freq: Every day | ORAL | Status: DC
  Administered 2014-05-14: 500 mg via ORAL
  Filled 2014-05-14: qty 1

## 2014-05-14 MED ORDER — PREDNISONE 20 MG PO TABS: 20.0000 mg | ORAL_TABLET | Freq: Every day | ORAL | Status: DC

## 2014-05-14 MED ORDER — LEVOFLOXACIN 500 MG PO TABS: 500.0000 mg | ORAL_TABLET | Freq: Every day | ORAL | Status: AC

## 2014-05-14 NOTE — Progress Notes (Signed)
Pt discharged to home. DC instructions given with significant other and son at bedside. Prescriptions x 4 drugs given. No concerns voiced. Pt left unit without signing DC instructions form as discharge was interrupted when this RN had to tend to another pt.'s immediate need. Managed to track pt downstairs where pt was able to sign document (hospital copy) as pt's copy had already been taken away by significant other. Pt left  unit in wheelchair pushed by nurse tech. Left in good condition. Vwilliams,rn.

## 2014-05-14 NOTE — Discharge Summary (Signed)
Physician Discharge Summary  April Ellis NOM:[id number] DOB: 17-Apr-1976 DOA: 05/12/2014  PCP: [name], MD  Admit date: 05/12/2014 Discharge date: 05/14/2014  Time spent: 65 minutes  Recommendations for Outpatient Follow-up:  1. Will followup with [name], MD one week post discharge. On followup and she'll need a basic metabolic profile done to followup on electrolytes and renal function. Patient COPD exacerbation need to be reassessed. 2. Followup with Dr. Chase Caller or pulmonary in 1-2 weeks.  Discharge Diagnoses:  Principal Problem:   Acute respiratory failure with hypoxia Active Problems:   COPD exacerbation   Morbid obesity   ELEVATED BLOOD PRESSURE WITHOUT DIAGNOSIS OF HYPERTENSION   Tobacco abuse   Discharge Condition: Stable and improved  Diet recommendation: Regular  Filed Weights   05/12/14 2210  Weight: 165.5 kg (364 lb 13.8 oz)    History of present illness:  April Ellis is a 38 y.o. female, who presents with complaints of shortness of breath for the last 24 hours, as well complaints of cough with productive sputum, reports she has used her nebulizer treatment multiple times without much help, which prompted her to come to ED, patient reports she was never hospitalized in the past for COPD exacerbation, but reports she was treated as an outpatient with by mouth prednisone and antibiotics, patient is being followed by [facility] as an outpatient, where she carries a diagnosis of COPD stage II with asthma component, as well being actively workup for obstructive sleep apnea and has an appointment with Dr. Chase Caller in few weeks, patient given prednisone and IV levofloxacin in ED, patient was hypoxic at 82% upon presentation on room air, after treatment she still desaturated upon ambulation well her O2 sat dropped to 72%, so hospitalist requested to admit the patient, she denies any fever, chills, chest pain, altered mental status, loss of consciousness,  hemoptysis or calf tenderness.  Patient denies any orthopnea, or worsening lower extremity edema, her chest x-ray showing possible vascular congestion with cardiomegaly   Hospital Course:  #1 acute hypoxic respiratory failure  Likely secondary to COPD exacerbation. Patient was noted on admission to have oxygen saturations in the 70s on room air on ambulation. Patient was admitted to a MedSurg floor. Chest x-ray had cardiomegaly and mild vascular congestion, however 2-D echo had EF of a 55-60% with no wall motion abnormalities. Patient was maintained on oxygen, scheduled nebulizers, IV Levaquin, IV Solu-Medrol. Patient improved clinically during the hospitalization. Patient's oxygenation improved to greater than 92% on room air. IV steroids were tapered down. IV Levaquin was transitioned to oral Levaquin. Patient be discharged home in stable and improved condition on 5 more days of oral Levaquin and a steroid taper. Patient will followup with PCP in pulmonary as outpatient.  #2 acute COPD exacerbation  Patient had presented with acute hypoxic respiratory failure with oxygen saturations in the 70s. Patient was also noted to be wheezing. Patient was admitted to a MedSurg bed. Patient was placed on scheduled nebulizers, IV Levaquin, Mucinex, IV Solu-Medrol and followed. Patient improved clinically and IV Solu-Medrol was tapered. Patient's oxygen saturations improved and was greater than 92% on room air and on ambulation. Patient subsequently transitioned to oral Levaquin and oral prednisone. Patient be discharged home on a prednisone taper and 5 more days of oral Levaquin to complete a one-week course of antibiotic therapy. Patient is to followup with PCP in pulmonary on discharge.  #3 tobacco abuse  Tobacco cessation. Patient did not want any nicotine patches.  Procedures: 2-D echo 05/13/2014  Chest x-ray 05/12/2014   Consultations:  None  Discharge Exam: Filed Vitals:   05/14/14 0631   BP: 121/61  Pulse: 60  Temp: 97.6 F (36.4 C)  Resp: 16    General: nad Cardiovascular: rrr Respiratory: ctab. Distant BS  Discharge Instructions You were cared for by a hospitalist during your hospital stay. If you have any questions about your discharge medications or the care you received while you were in the hospital after you are discharged, you can call the unit and asked to speak with the hospitalist on call if the hospitalist that took care of you is not available. Once you are discharged, your primary care physician will handle any further medical issues. Please note that NO REFILLS for any discharge medications will be authorized once you are discharged, as it is imperative that you return to your primary care physician (or establish a relationship with a primary care physician if you do not have one) for your aftercare needs so that they can reassess your need for medications and monitor your lab values.      Discharge Instructions   Diet general    Complete by:  As directed      Discharge instructions    Complete by:  As directed   Follow up with [name], MD in 1 week. Follow up with pulmonary in 1-2 weeks.     Increase activity slowly    Complete by:  As directed             Medication List         albuterol 108 (90 BASE) MCG/ACT inhaler  Commonly known as:  PROVENTIL HFA;VENTOLIN HFA  Inhale 2 puffs into the lungs every 6 (six) hours as needed for wheezing or shortness of breath.     albuterol (2.5 MG/3ML) 0.083% nebulizer solution  Commonly known as:  PROVENTIL  Take 3 mLs (2.5 mg total) by nebulization every 6 (six) hours as needed for wheezing or shortness of breath. Use 3 times daily for 4 days then use as needed.     guaiFENesin 600 MG 12 hr tablet  Commonly known as:  MUCINEX  Take 2 tablets (1,200 mg total) by mouth 2 (two) times daily. Use for 5 days then stop     levofloxacin 500 MG tablet  Commonly known as:  LEVAQUIN  Take 1 tablet (500 mg  total) by mouth daily. Take for 5 days then stop.  Start taking on:  05/15/2014     mometasone-formoterol 200-5 MCG/ACT Aero  Commonly known as:  DULERA  Inhale 2 puffs into the lungs 2 (two) times daily.     predniSONE 20 MG tablet  Commonly known as:  DELTASONE  Take 1-3 tablets (20-60 mg total) by mouth daily with breakfast. Take 3 tablets (60mg ) daily x 3 days, then 2 tablets (40mg ) daily x 3 days, then 1 tablet (20mg ) daily x 3 days then stop.       No Known Allergies Follow-up Information   Follow up with [name], MD. Schedule an appointment as soon as possible for a visit in 1 week.   Specialties:  Internal Medicine, Radiology   Contact information:   [address] [place] [address] [phone]       Follow up with [facility], MD. Schedule an appointment as soon as possible for a visit in 2 weeks. (f/u in 1-2 weeks or as scheduled.)    Specialty:  Pulmonary Disease  Contact information:   [place] [address] [phone]        The results of significant diagnostics from this hospitalization (including imaging, microbiology, ancillary and laboratory) are listed below for reference.    Significant Diagnostic Studies: Dg Chest 2 View  05/12/2014   CLINICAL DATA:  Shortness of breath.  EXAM: CHEST  2 VIEW  COMPARISON:  04/09/2014  FINDINGS: There is slight cardiomegaly with slight pulmonary vascular congestion as indicated by distention of the azygos vein. No infiltrates or effusions. No osseous abnormality.  IMPRESSION: New slight cardiomegaly with slight pulmonary vascular congestion.   Electronically Signed   By: Rozetta Nunnery M.D.   On: 05/12/2014 18:18    Microbiology: Recent Results (from the past 240 hour(s))  CULTURE, BLOOD (ROUTINE X 2)     Status: None   Collection Time    05/12/14  9:40 PM      Result Value Ref Range Status   Specimen Description BLOOD R HAND   Final   Special Requests BOTTLES DRAWN AEROBIC AND  ANAEROBIC [facility] EACH   Final   Culture  Setup Time     Final   Value: 05/13/2014 00:56     Performed at [facility]   Culture     Final   Value:        BLOOD CULTURE RECEIVED NO GROWTH TO DATE CULTURE WILL BE HELD FOR 5 DAYS BEFORE ISSUING A FINAL NEGATIVE REPORT     Performed at [facility]   Report Status PENDING   Incomplete  CULTURE, BLOOD (ROUTINE X 2)     Status: None   Collection Time    05/12/14  9:40 PM      Result Value Ref Range Status   Specimen Description BLOOD RIGHT ANTECUBITAL   Final   Special Requests BOTTLES DRAWN AEROBIC AND ANAEROBIC [facility] EACH   Final   Culture  Setup Time     Final   Value: 05/13/2014 00:56     Performed at [facility]   Culture     Final   Value:        BLOOD CULTURE RECEIVED NO GROWTH TO DATE CULTURE WILL BE HELD FOR 5 DAYS BEFORE ISSUING A FINAL NEGATIVE REPORT     Performed at [facility]   Report Status PENDING   Incomplete     Labs: Basic Metabolic Panel:  Recent Labs Lab 05/12/14 1927 05/13/14 0453 05/14/14 0540  NA 138 138 136*  K 3.8 4.1 4.7  CL 102 103 101  CO2 27 24 25   GLUCOSE 106* 167* 138*  BUN 6 7 13   CREATININE 0.73 0.70 0.67  CALCIUM 8.5 8.7 9.2   Liver Function Tests: No results found for this basename: AST, ALT, ALKPHOS, BILITOT, PROT, ALBUMIN,  in the last 168 hours No results found for this basename: LIPASE, AMYLASE,  in the last 168 hours No results found for this basename: AMMONIA,  in the last 168 hours CBC:  Recent Labs Lab 05/12/14 1927 05/13/14 0453 05/14/14 0540  WBC 12.3* 10.7* 11.8*  NEUTROABS 8.9*  --   --   HGB 9.6* 9.7* 10.1*  HCT 34.2* 33.8* 34.7*  MCV 72.3* 70.7* 69.8*  PLT 275 271 323   Cardiac Enzymes: No results found for this basename: CKTOTAL, CKMB, CKMBINDEX, TROPONINI,  in the last 168 hours BNP: BNP (last 3 results) No results found for this basename: PROBNP,  in the last 8760 hours CBG: No results  found for this basename: GLUCAP,  in the  last 168 hours     Signed:  [facility] MD [facility] 05/14/2014, 2:07 PM

## 2014-05-15 NOTE — ED Provider Notes (Signed)
Medical screening examination/treatment/procedure(s) were performed by non-physician practitioner and as supervising physician I was immediately available for consultation/collaboration.   EKG Interpretation   Date/Time:  Sunday May 12 2014 18:04:30 EDT Ventricular Rate:  92 PR Interval:  143 QRS Duration: 89 QT Interval:  356 QTC Calculation: 440 R Axis:   58 Text Interpretation:  Sinus rhythm Baseline wander in lead(s) V2 V3 V4 V5  No significant change since last tracing Confirmed by Mikahla Wisor  MD-J, Talar Fraley  ([id number]) on 05/12/2014 6:09:55 PM        Dorie Rank, MD 05/15/14 1531

## 2014-05-19 LAB — CULTURE, BLOOD (ROUTINE X 2)
Culture: NO GROWTH
Culture: NO GROWTH

## 2014-06-06 ENCOUNTER — Telehealth: Payer: Self-pay | Admitting: Internal Medicine

## 2014-06-06 NOTE — Telephone Encounter (Signed)
I called this patient and left detailed message to call me @ [phone] to pick up home sleep test, just tried to call patient, got VM, left another detailed msg on her phone. April Ellis

## 2014-06-07 ENCOUNTER — Telehealth: Payer: Self-pay | Admitting: Internal Medicine

## 2014-06-07 NOTE — Telephone Encounter (Signed)
Noted. April Ellis

## 2014-06-17 ENCOUNTER — Ambulatory Visit: Payer: No Typology Code available for payment source | Admitting: Internal Medicine

## 2014-06-27 ENCOUNTER — Inpatient Hospital Stay (HOSPITAL_COMMUNITY)
Admission: EM | Admit: 2014-06-27 | Discharge: 2014-06-28 | DRG: 189 | Disposition: A | Discharge status: Home / Self Care | Payer: No Typology Code available for payment source | Attending: Internal Medicine | Admitting: Internal Medicine

## 2014-06-27 ENCOUNTER — Encounter (HOSPITAL_COMMUNITY): Payer: Self-pay | Admitting: Emergency Medicine

## 2014-06-27 ENCOUNTER — Emergency Department (HOSPITAL_COMMUNITY): Payer: No Typology Code available for payment source

## 2014-06-27 DIAGNOSIS — J40 Bronchitis, not specified as acute or chronic: Secondary | ICD-10-CM | POA: Diagnosis present

## 2014-06-27 DIAGNOSIS — D509 Iron deficiency anemia, unspecified: Secondary | ICD-10-CM | POA: Diagnosis present

## 2014-06-27 DIAGNOSIS — Z6841 Body Mass Index (BMI) 40.0 and over, adult: Secondary | ICD-10-CM | POA: Diagnosis present

## 2014-06-27 DIAGNOSIS — G4733 Obstructive sleep apnea (adult) (pediatric): Secondary | ICD-10-CM

## 2014-06-27 DIAGNOSIS — J45901 Unspecified asthma with (acute) exacerbation: Secondary | ICD-10-CM

## 2014-06-27 DIAGNOSIS — J438 Other emphysema: Secondary | ICD-10-CM

## 2014-06-27 DIAGNOSIS — R0902 Hypoxemia: Secondary | ICD-10-CM

## 2014-06-27 DIAGNOSIS — R600 Localized edema: Secondary | ICD-10-CM | POA: Diagnosis present

## 2014-06-27 DIAGNOSIS — Z720 Tobacco use: Secondary | ICD-10-CM

## 2014-06-27 DIAGNOSIS — F17200 Nicotine dependence, unspecified, uncomplicated: Secondary | ICD-10-CM | POA: Diagnosis present

## 2014-06-27 DIAGNOSIS — J189 Pneumonia, unspecified organism: Secondary | ICD-10-CM | POA: Diagnosis present

## 2014-06-27 DIAGNOSIS — J4521 Mild intermittent asthma with (acute) exacerbation: Secondary | ICD-10-CM

## 2014-06-27 DIAGNOSIS — J441 Chronic obstructive pulmonary disease with (acute) exacerbation: Secondary | ICD-10-CM

## 2014-06-27 DIAGNOSIS — R0789 Other chest pain: Secondary | ICD-10-CM

## 2014-06-27 DIAGNOSIS — J9601 Acute respiratory failure with hypoxia: Secondary | ICD-10-CM | POA: Diagnosis not present

## 2014-06-27 DIAGNOSIS — R609 Edema, unspecified: Secondary | ICD-10-CM

## 2014-06-27 DIAGNOSIS — R0602 Shortness of breath: Secondary | ICD-10-CM

## 2014-06-27 DIAGNOSIS — R030 Elevated blood-pressure reading, without diagnosis of hypertension: Secondary | ICD-10-CM

## 2014-06-27 LAB — COMPREHENSIVE METABOLIC PANEL
ALT: 10 U/L (ref 0–35)
AST: 13 U/L (ref 0–37)
Albumin: 3.4 g/dL — ABNORMAL LOW (ref 3.5–5.2)
Alkaline Phosphatase: 106 U/L (ref 39–117)
Anion gap: 12 (ref 5–15)
BILIRUBIN TOTAL: 0.6 mg/dL (ref 0.3–1.2)
BUN: 6 mg/dL (ref 6–23)
CO2: 24 meq/L (ref 19–32)
Calcium: 8.7 mg/dL (ref 8.4–10.5)
Chloride: 105 mEq/L (ref 96–112)
Creatinine, Ser: 0.65 mg/dL (ref 0.50–1.10)
GFR calc Af Amer: >90 mL/min (ref >90)
GLUCOSE: 128 mg/dL — AB (ref 70–99)
Potassium: 4.6 mEq/L (ref 3.7–5.3)
SODIUM: 141 meq/L (ref 137–147)
Total Protein: 8.1 g/dL (ref 6.0–8.3)

## 2014-06-27 LAB — CBC WITH DIFFERENTIAL/PLATELET
BASOS PCT: 0 % (ref 0–1)
Basophils Absolute: 0 10*3/uL (ref 0.0–0.1)
EOS PCT: 1 % (ref 0–5)
Eosinophils Absolute: 0.1 10*3/uL (ref 0.0–0.7)
HCT: 34.4 % — ABNORMAL LOW (ref 36.0–46.0)
HEMOGLOBIN: 9.9 g/dL — AB (ref 12.0–15.0)
Lymphocytes Relative: 6 % — ABNORMAL LOW (ref 12–46)
Lymphs Abs: 0.5 10*3/uL — ABNORMAL LOW (ref 0.7–4.0)
MCH: 20.5 pg — ABNORMAL LOW (ref 26.0–34.0)
MCHC: 28.8 g/dL — ABNORMAL LOW (ref 30.0–36.0)
MCV: 71.4 fL — ABNORMAL LOW (ref 78.0–100.0)
MONOS PCT: 0 % — AB (ref 3–12)
Monocytes Absolute: 0 10*3/uL — ABNORMAL LOW (ref 0.1–1.0)
NEUTROS PCT: 93 % — AB (ref 43–77)
Neutro Abs: 8.5 10*3/uL — ABNORMAL HIGH (ref 1.7–7.7)
Platelets: 305 10*3/uL (ref 150–400)
RBC: 4.82 MIL/uL (ref 3.87–5.11)
RDW: 18.5 % — ABNORMAL HIGH (ref 11.5–15.5)
WBC: 9.1 10*3/uL (ref 4.0–10.5)

## 2014-06-27 LAB — BLOOD GAS, ARTERIAL
ACID-BASE EXCESS: 0.5 mmol/L (ref 0.0–2.0)
BICARBONATE: 25.7 meq/L — AB (ref 20.0–24.0)
Drawn by: 30599
FIO2: 0.28 %
O2 SAT: 89.4 %
Patient temperature: 98.6
TCO2: 23.2 mmol/L (ref 0–100)
pCO2 arterial: 46 mmHg — ABNORMAL HIGH (ref 35.0–45.0)
pH, Arterial: 7.366 (ref 7.350–7.450)
pO2, Arterial: 61.5 mmHg — ABNORMAL LOW (ref 80.0–100.0)

## 2014-06-27 LAB — RETICULOCYTES
RBC.: 4.87 MIL/uL (ref 3.87–5.11)
RETIC CT PCT: 1.7 % (ref 0.4–3.1)
Retic Count, Absolute: 82.8 10*3/uL (ref 19.0–186.0)

## 2014-06-27 LAB — IRON AND TIBC
Iron: <10 ug/dL — ABNORMAL LOW (ref 42–135)
UIBC: 435 ug/dL — AB (ref 125–400)

## 2014-06-27 LAB — TROPONIN I: Troponin I: <0.3 ng/mL (ref <0.30)

## 2014-06-27 LAB — MAGNESIUM: Magnesium: 2.1 mg/dL (ref 1.5–2.5)

## 2014-06-27 LAB — FERRITIN: Ferritin: 4 ng/mL — ABNORMAL LOW (ref 10–291)

## 2014-06-27 MED ORDER — ONDANSETRON HCL 4 MG/2ML IJ SOLN: 4.0000 mg | Freq: Four times a day (QID) | INTRAMUSCULAR | Status: DC | PRN

## 2014-06-27 MED ORDER — ENOXAPARIN SODIUM 80 MG/0.8ML ~~LOC~~ SOLN
80.0000 mg | SUBCUTANEOUS | Status: DC
  Administered 2014-06-27: 80 mg via SUBCUTANEOUS
  Filled 2014-06-27 (×2): qty 0.8

## 2014-06-27 MED ORDER — ACETAMINOPHEN 325 MG PO TABS: 650.0000 mg | ORAL_TABLET | Freq: Four times a day (QID) | ORAL | Status: DC | PRN

## 2014-06-27 MED ORDER — IPRATROPIUM-ALBUTEROL 0.5-2.5 (3) MG/3ML IN SOLN
3.0000 mL | Freq: Four times a day (QID) | RESPIRATORY_TRACT | Status: DC
  Administered 2014-06-27 – 2014-06-28 (×4): 3 mL via RESPIRATORY_TRACT
  Filled 2014-06-27 (×4): qty 3

## 2014-06-27 MED ORDER — METHYLPREDNISOLONE SODIUM SUCC 125 MG IJ SOLR
80.0000 mg | Freq: Four times a day (QID) | INTRAMUSCULAR | Status: DC
  Administered 2014-06-27 – 2014-06-28 (×5): 80 mg via INTRAVENOUS
  Filled 2014-06-27 (×8): qty 1.28

## 2014-06-27 MED ORDER — ALBUTEROL SULFATE (2.5 MG/3ML) 0.083% IN NEBU: 2.5000 mg | INHALATION_SOLUTION | RESPIRATORY_TRACT | Status: DC | PRN

## 2014-06-27 MED ORDER — ACETAMINOPHEN 650 MG RE SUPP: 650.0000 mg | Freq: Four times a day (QID) | RECTAL | Status: DC | PRN

## 2014-06-27 MED ORDER — MOMETASONE FURO-FORMOTEROL FUM 200-5 MCG/ACT IN AERO
2.0000 | INHALATION_SPRAY | Freq: Two times a day (BID) | RESPIRATORY_TRACT | Status: DC
  Administered 2014-06-27 – 2014-06-28 (×2): 2 via RESPIRATORY_TRACT
  Filled 2014-06-27: qty 8.8

## 2014-06-27 MED ORDER — ALBUTEROL (5 MG/ML) CONTINUOUS INHALATION SOLN
10.0000 mg | INHALATION_SOLUTION | RESPIRATORY_TRACT | Status: AC
  Administered 2014-06-27: 10 mg via RESPIRATORY_TRACT
  Filled 2014-06-27: qty 20

## 2014-06-27 MED ORDER — LEVOFLOXACIN 750 MG PO TABS
750.0000 mg | ORAL_TABLET | Freq: Every day | ORAL | Status: DC
  Administered 2014-06-27 – 2014-06-28 (×2): 750 mg via ORAL
  Filled 2014-06-27 (×2): qty 1

## 2014-06-27 MED ORDER — ONDANSETRON HCL 4 MG PO TABS: 4.0000 mg | ORAL_TABLET | Freq: Four times a day (QID) | ORAL | Status: DC | PRN

## 2014-06-27 MED ORDER — IPRATROPIUM-ALBUTEROL 0.5-2.5 (3) MG/3ML IN SOLN
3.0000 mL | Freq: Once | RESPIRATORY_TRACT | Status: AC
  Administered 2014-06-27: 3 mL via RESPIRATORY_TRACT
  Filled 2014-06-27: qty 3

## 2014-06-27 MED ORDER — ENOXAPARIN SODIUM 40 MG/0.4ML ~~LOC~~ SOLN: 40.0000 mg | SUBCUTANEOUS | Status: DC

## 2014-06-27 MED ORDER — GUAIFENESIN ER 600 MG PO TB12
1200.0000 mg | ORAL_TABLET | Freq: Two times a day (BID) | ORAL | Status: DC
  Administered 2014-06-27 – 2014-06-28 (×3): 1200 mg via ORAL
  Filled 2014-06-27 (×4): qty 2

## 2014-06-27 NOTE — ED Notes (Signed)
Pt's O2 sat decreased to 83%, while ambulating w/ pulse ox.  PA notified.

## 2014-06-27 NOTE — ED Notes (Signed)
Pt ambulated to restroom.  NAD noted and Pt reported "I felt fine.  No shortness of breath or anything."  Initial O2 sat 87%.  Pt placed on 2L  and O2 increased to 93%.

## 2014-06-27 NOTE — ED Provider Notes (Signed)
CSN: [id number]     Arrival date & time 06/27/14  0158 History   First MD Initiated Contact with Patient 06/27/14 [phone]     Chief Complaint  Patient presents with  . Shortness of Breath     (Consider location/radiation/quality/duration/timing/severity/associated sxs/prior Treatment) Patient is a 38 y.o. female presenting with shortness of breath.  Shortness of Breath Associated symptoms: cough   Associated symptoms: no chest pain, no fever and no vomiting    April Ellis is a(n) 38 y.o. female who presents to the emergency department for shortness of breath. She has a past medical history of COPD, asthma, morbid obesity and is [facility] by [facility]. The patient was admitted for acute respiratory failure and hypoxia on 05/13/2014. Patient began having symptoms of upper respiratory infection 2 weeks ago with scratchy throat, rhinorrhea, cough productive of clear sputum. She has been using over-the-counter Mucinex. Patient normally uses her home nebulizer twice a day however over the past week has been having to use it up to 8 or 9 times daily. On Sunday the patient developed worsening wheezing which has progressively worsened over the past few days. Yesterday the patient states her shortness of breath became so severe she could barely walk a few steps without severe chest tightness and wheezing.. she denies any fevers, chills, myalgias, malaise, arthralgias. Patient denies chest pain. Denies exogenous estrogen use, lower extremity pain or swelling, recent travel or immobilization, history of PE or DVT, family or personal history of bleeding or clotting disorders, cough or hemoptysis. She does not use home 02.    Past Medical History  Diagnosis Date  . Anemia   . Bronchitis   . Asthma   . Obesity    Past Surgical History  Procedure Laterality Date  . No past surgeries     Family History  Problem Relation Age of Onset  . Diabetes Mother   . Diabetes Sister   .  Hypertension Sister   . Hypertension Sister    History  Substance Use Topics  . Smoking status: Current Some Day Smoker -- 0.25 packs/day for 15 years    Types: Cigarettes  . Smokeless tobacco: Not on file     Comment: 1-2 cigs per day  . Alcohol Use: No   OB History   Grav Para Term Preterm Abortions TAB SAB Ect Mult Living                 Review of Systems  Constitutional: Negative for fever and chills.  HENT: Positive for congestion, rhinorrhea and voice change. Negative for trouble swallowing.   Respiratory: Positive for cough, chest tightness and shortness of breath.   Cardiovascular: Negative for chest pain and leg swelling.  Gastrointestinal: Negative for vomiting.  All other systems reviewed and are negative.   Ten systems reviewed and are negative for acute change, except as noted in the HPI.     Allergies  Review of patient's allergies indicates no known allergies.  Home Medications   Prior to Admission medications   Medication Sig Start Date End Date Taking? Authorizing Provider  albuterol (PROVENTIL HFA;VENTOLIN HFA) 108 (90 BASE) MCG/ACT inhaler Inhale 2 puffs into the lungs every 6 (six) hours as needed for wheezing or shortness of breath. 03/06/14  Yes Posey Boyer, MD  albuterol (PROVENTIL) (2.5 MG/3ML) 0.083% nebulizer solution Take 3 mLs (2.5 mg total) by nebulization every 6 (six) hours as needed for wheezing or shortness of breath. Use 3 times daily for 4 days then  use as needed. 05/14/14  Yes Eugenie Filler, MD  guaiFENesin (MUCINEX) 600 MG 12 hr tablet Take 2 tablets (1,200 mg total) by mouth 2 (two) times daily. Use for 5 days then stop 05/14/14  Yes Eugenie Filler, MD  mometasone-formoterol [facility]) 200-5 MCG/ACT AERO Inhale 2 puffs into the lungs 2 (two) times daily. 05/01/14  Yes Tammy S Parrett, NP   BP 126/92  Pulse 102  Temp(Src) 98.2 F (36.8 C) (Oral)  Resp 13  Ht 5\' 2"  (1.575 m)  Wt 365 lb (165.563 kg)  BMI 66.74 kg/m2  SpO2 90%  LMP  06/26/2014 Physical Exam  Nursing note and vitals reviewed. Constitutional: She is oriented to person, place, and time. She appears well-developed and well-nourished. No distress.  HENT:  Head: Normocephalic and atraumatic.  Mouth/Throat: Oropharynx is clear and moist.  Eyes: Conjunctivae and EOM are normal. Pupils are equal, round, and reactive to light. No scleral icterus.  No horizontal, vertical or rotational nystagmus  Neck: Normal range of motion. Neck supple.  Full active and passive ROM without pain No midline or paraspinal tenderness No nuchal rigidity or meningeal signs  Cardiovascular: Normal rate, regular rhythm and intact distal pulses.   Pulmonary/Chest: Effort normal. No respiratory distress. She has wheezes. She has no rales.  Diffuse mild expiratory wheeze after duoneb treatment and solumedrol. 02 sat 90-93% on 2L.  Abdominal: Soft. Bowel sounds are normal. There is no tenderness. There is no rebound and no guarding.  Musculoskeletal: Normal range of motion.  Lymphadenopathy:    She has no cervical adenopathy.  Neurological: She is alert and oriented to person, place, and time. She has normal reflexes. No cranial nerve deficit. She exhibits normal muscle tone. Coordination normal.  Mental Status:  Alert, oriented, thought content appropriate. Speech fluent without evidence of aphasia. Able to follow 2 step commands without difficulty.  Cranial Nerves:  II:  Peripheral visual fields grossly normal, pupils equal, round, reactive to light III,IV, VI: ptosis not present, extra-ocular motions intact bilaterally  V,VII: smile symmetric, facial light touch sensation equal VIII: hearing grossly normal bilaterally  IX,X: gag reflex present  XI: bilateral shoulder shrug equal and strong XII: midline tongue extension  Motor:  5/5 in upper and lower extremities bilaterally including strong and equal grip strength and dorsiflexion/plantar flexion Sensory: Pinprick and light touch  normal in all extremities.  Deep Tendon Reflexes: 2+ and symmetric  Cerebellar: normal finger-to-nose with bilateral upper extremities Gait: normal gait and balance CV: distal pulses palpable throughout   Skin: Skin is warm and dry. No rash noted. She is not diaphoretic.  Psychiatric: She has a normal mood and affect. Her behavior is normal. Judgment and thought content normal.    ED Course  Procedures (including critical care time) Labs Review Labs Reviewed  BLOOD GAS, ARTERIAL  CBC WITH DIFFERENTIAL  COMPREHENSIVE METABOLIC PANEL    Imaging Review Dg Chest Port 1 View  06/27/2014   CLINICAL DATA:  Asthma, shortness of breath.  EXAM: PORTABLE CHEST - 1 VIEW  COMPARISON:  Chest radiograph May 12, 2014  FINDINGS: The cardiac silhouette may be mildly enlarged, even with consideration to this AP technique. Mediastinal silhouette is nonsuspicious. No pleural effusions or focal consolidations. No pneumothorax. Large body habitus. Osseous structures are nonsuspicious.  IMPRESSION: Suspected mild cardiomegaly.  No acute pulmonary process.   Electronically Signed   By: Elon Alas   On: 06/27/2014 03:45     EKG Interpretation   Date/Time:  Thursday June 27 2014 02:05:03 EDT Ventricular Rate:  94 PR Interval:  139 QRS Duration: 88 QT Interval:  352 QTC Calculation: 440 R Axis:   51 Text Interpretation:  Sinus rhythm Low voltage, precordial leads No acute  findings Unchanged from prior Confirmed by NANAVATI, MD, ANKIT ([id number]) on  06/27/2014 2:48:03 AM      MDM   Final diagnoses:  None   6:38 AM BP 126/92  Pulse 102  Temp(Src) 98.2 F (36.8 C) (Oral)  Resp 13  Ht 5\' 2"  (1.575 m)  Wt 365 lb (165.563 kg)  BMI 66.74 kg/m2  SpO2 90%  LMP 06/26/2014 Patient here with what appears to be acute asthma exacerbation. She has a history of COPD. Patient states that she is still smoking but has been smoking one cigarette every 2-3 days due to her URI symptoms. She still has  diffuse expiratory wheeze after one neb treatment with Solu-Medrol IV.  concern for hypoxia considering her oxygen saturation is at 90-93% on 2 L.. Patient will be given an hour-long neb treatment. We will reevaluate with an ABG, and ambulation with pulse ox.   8:17 AM BP 126/92  Pulse 102  Temp(Src) 98.2 F (36.8 C) (Oral)  Resp 13  Ht 5\' 2"  (1.575 m)  Wt 365 lb (165.563 kg)  BMI 66.74 kg/m2  SpO2 90%  LMP 06/26/2014 Patient is speaking in full sentence , I took the pateint off of her 2L and she desaturated to 87% on RA. The patient will be checked with a different pulse ox. She states that she feels fine. Her chemistries show now elevation in her BICARB so I doubt chronic hypercarbia. Will obtain ABG. Diffuses wheezes still present on PE.  9:37 AM Patient ambulated with 02, desat to 83% on RA. Appears to have a compensated resp acidosis ABG. Feel she needs admission for hypoxia.  Pt stable in ED with no significant deterioration in condition. Patient / Family / Caregiver informed of clinical course, understand medical decision-making process, and agree with plan.  .I personally reviewed the imaging tests through PACS system. I have reviewed and interpreted Lab values. I reviewed available ER/hospitalization records through the EMR   Patient seen in shared visit with attending physician.    Margarita Mail, PA-C 07/02/14 1153

## 2014-06-27 NOTE — ED Notes (Signed)
Bed: RESA Expected date:  Expected time:  Means of arrival:  Comments: EMS/COPD/SOB

## 2014-06-27 NOTE — ED Notes (Signed)
Pt given water by Admitting RN.

## 2014-06-27 NOTE — ED Notes (Signed)
MD at bedside. 

## 2014-06-27 NOTE — ED Notes (Signed)
Brought in by EMS from home with c/o shortness of breath.  Pt reported that she has been having shortness of breath since Sunday that is getting progressively worse; pt reports hx of COPD.  Pt was given Albuterol 5 mg and Atrovent 0.5 mg neb tx and Solu-Medrol 125 mg IV en route to ED.

## 2014-06-27 NOTE — Progress Notes (Signed)
*  PRELIMINARY RESULTS* Vascular Ultrasound Lower extremity venous duplex has been completed.   This is a limited study due to patient unwilling to take off clothing for complete test. Bilateral popliteal veins, posterior tibial veins, and peroneal veins were evaluated and there is no evidence of DVT. Normal phasic venous flow is noted which indicates No proximal obstruction.   Landry Mellow, RDMS, RVT  06/27/2014, 3:28 PM

## 2014-06-27 NOTE — H&P (Signed)
History and Physical  April Ellis TIR:[id number] DOB: 1976-05-30 DOA: 06/27/2014   PCP: [name], MD   Chief Complaint: dyspnea  HPI:   38 year old female with history of COPD presents with three-day history of worsening shortness of breath. The patient states that she suffered an upper respiratory infection approximately 2 weeks ago. She initially felt somewhat better, but in the last few days she has noted increasing shortness of breath with increased cough producing clear sputum. Patient states that she has used her Proventil inhaler more frequently without significant improvement. She denies any fevers, chills, hemoptysis, nausea, vomiting, diarrhea. She complains of some chest discomfort and increasing shortness of breath with activity. The patient had continued to smoke up until 3 days prior to this admission.  In the emergency department, the patient was noted to have oxygen saturation of 83% with ambulation. The patient was given nebulizer treatment with some improvement of her shortness of breath. BMP and hepatic panel are unremarkable. CBC revealed microcytic anemia with hemoglobin 9.9. Chest x-ray was negative for any acute findings. ABG revealed 7.36/46/61/25 on 2 L.  Pt had PFTs on 05/01/14 showing FEV1 of 55% predicted. Assessment/Plan:  acute respiratory failure with hypoxia  -Secondary to COPD exacerbation  -Supplemental oxygen  -Pulmonary hygiene  -Check magnesium -presently stable on 2L [place]  COPD exacerbation -Aerosolized albuterol and Atrovent -Start IV Solu-Medrol -Start po levofloxacin Atypical chest pain -Cycle Troponins -EKG sinus rhythm without ST-T wave changes  morbid obesity -BMI 66 -lifestyle modification Lower extremity Edema -duplex r/o DVT Microcytic Anemia -iron studies       Past Medical History  Diagnosis Date  . Anemia   . Bronchitis   . Asthma   . Obesity    Past Surgical History  Procedure Laterality Date  . No past  surgeries     Social History:  reports that she has been smoking Cigarettes.  She has a 3.75 pack-year smoking history. She does not have any smokeless tobacco history on file. She reports that she does not drink alcohol or use illicit drugs.   Family History  Problem Relation Age of Onset  . Diabetes Mother   . Diabetes Sister   . Hypertension Sister   . Hypertension Sister      No Known Allergies    Prior to Admission medications   Medication Sig Start Date End Date Taking? Authorizing Provider  albuterol (PROVENTIL HFA;VENTOLIN HFA) 108 (90 BASE) MCG/ACT inhaler Inhale 2 puffs into the lungs every 6 (six) hours as needed for wheezing or shortness of breath. 03/06/14  Yes Posey Boyer, MD  albuterol (PROVENTIL) (2.5 MG/3ML) 0.083% nebulizer solution Take 3 mLs (2.5 mg total) by nebulization every 6 (six) hours as needed for wheezing or shortness of breath. Use 3 times daily for 4 days then use as needed. 05/14/14  Yes Eugenie Filler, MD  guaiFENesin (MUCINEX) 600 MG 12 hr tablet Take 2 tablets (1,200 mg total) by mouth 2 (two) times daily. Use for 5 days then stop 05/14/14  Yes Eugenie Filler, MD  mometasone-formoterol [facility]) 200-5 MCG/ACT AERO Inhale 2 puffs into the lungs 2 (two) times daily. 05/01/14  Yes Tammy S Parrett, NP    Review of Systems:  Constitutional:  No weight loss, night sweats, Fevers, chills, fatigue.  Head&Eyes: No headache.  No vision loss.  No eye pain or scotoma ENT:  No Difficulty swallowing,Tooth/dental problems,Sore throat,  No ear ache, post nasal drip,  Cardio-vascular:  No chest  pain, Orthopnea, PND, swelling in lower extremities,  dizziness, palpitations  GI:  No  abdominal pain, nausea, vomiting, diarrhea, loss of appetite, hematochezia, melena, heartburn, indigestion, Resp:  No coughing up of blood .No chest wall deformity  Skin:  no rash or lesions.  GU:  no dysuria, change in color of urine, no urgency or frequency. No flank pain.    Musculoskeletal:  No joint pain or swelling. No decreased range of motion. No back pain.  Psych:  No change in mood or affect. No depression or anxiety. Neurologic: No headache, no dysesthesia, no focal weakness, no vision loss. No syncope  Physical Exam: Filed Vitals:   06/27/14 0202 06/27/14 0256 06/27/14 0626 06/27/14 0824  BP: 126/92   117/58  Pulse:    100  Temp: 98.2 F (36.8 C)   98.1 F (36.7 C)  TempSrc: Oral   Oral  Resp: 13   17  Height: 5\' 2"  (1.575 m)     Weight: 165.563 kg (365 lb)     SpO2: 100% 89% 90% 95%   General:  A&O x 3, NAD, nontoxic, pleasant/cooperative Head/Eye: No conjunctival hemorrhage, no icterus, Ness/AT, No nystagmus ENT:  No icterus,  No thrush, good dentition, no pharyngeal exudate Neck:  No masses, no lymphadenpathy, no bruits, no meningismus CV:  RRR, no rub, no gallop, no S3 Lung:  Bilateral expiratory wheeze. Good air movement. Bibasilar rales. Abdomen: soft/NT, +BS, nondistended, no peritoneal signs Ext: No cyanosis, No rashes, No petechiae, No lymphangitis, 1+LE edema   Labs on Admission:  Basic Metabolic Panel:  Recent Labs Lab 06/27/14 0618  NA 141  K 4.6  CL 105  CO2 24  GLUCOSE 128*  BUN 6  CREATININE 0.65  CALCIUM 8.7   Liver Function Tests:  Recent Labs Lab 06/27/14 0618  AST 13  ALT 10  ALKPHOS 106  BILITOT 0.6  PROT 8.1  ALBUMIN 3.4*   No results found for this basename: LIPASE, AMYLASE,  in the last 168 hours No results found for this basename: AMMONIA,  in the last 168 hours CBC:  Recent Labs Lab 06/27/14 0618  WBC 9.1  NEUTROABS 8.5*  HGB 9.9*  HCT 34.4*  MCV 71.4*  PLT 305   Cardiac Enzymes: No results found for this basename: CKTOTAL, CKMB, CKMBINDEX, TROPONINI,  in the last 168 hours BNP: No components found with this basename: POCBNP,  CBG: No results found for this basename: GLUCAP,  in the last 168 hours  Radiological Exams on Admission: Dg Chest Port 1 View  06/27/2014   CLINICAL  DATA:  Asthma, shortness of breath.  EXAM: PORTABLE CHEST - 1 VIEW  COMPARISON:  Chest radiograph May 12, 2014  FINDINGS: The cardiac silhouette may be mildly enlarged, even with consideration to this AP technique. Mediastinal silhouette is nonsuspicious. No pleural effusions or focal consolidations. No pneumothorax. Large body habitus. Osseous structures are nonsuspicious.  IMPRESSION: Suspected mild cardiomegaly.  No acute pulmonary process.   Electronically Signed   By: Elon Alas   On: 06/27/2014 03:45    EKG: Independently reviewed.  sinus rhythm, no ST-T wave change    Time spent:60 minutes Code Status:   FULL Family Communication:   No Family at bedside   Sheila Gervasi, DO  [facility] Pager [phone]  If 7PM-7AM, please contact night-coverage [email or web address] Password [email or web address] 06/27/2014, 10:22 AM

## 2014-06-28 DIAGNOSIS — J4521 Mild intermittent asthma with (acute) exacerbation: Secondary | ICD-10-CM

## 2014-06-28 LAB — BASIC METABOLIC PANEL
Anion gap: 10 (ref 5–15)
BUN: 14 mg/dL (ref 6–23)
CALCIUM: 9.3 mg/dL (ref 8.4–10.5)
CO2: 27 mEq/L (ref 19–32)
CREATININE: 0.68 mg/dL (ref 0.50–1.10)
Chloride: 101 mEq/L (ref 96–112)
GLUCOSE: 159 mg/dL — AB (ref 70–99)
POTASSIUM: 5 meq/L (ref 3.7–5.3)
Sodium: 138 mEq/L (ref 137–147)

## 2014-06-28 LAB — TROPONIN I: Troponin I: <0.3 ng/mL (ref <0.30)

## 2014-06-28 MED ORDER — IPRATROPIUM-ALBUTEROL 0.5-2.5 (3) MG/3ML IN SOLN
3.0000 mL | Freq: Three times a day (TID) | RESPIRATORY_TRACT | Status: DC
  Administered 2014-06-28: 3 mL via RESPIRATORY_TRACT
  Filled 2014-06-28: qty 3

## 2014-06-28 MED ORDER — LEVOFLOXACIN 750 MG PO TABS: 750.0000 mg | ORAL_TABLET | Freq: Every day | ORAL | Status: DC

## 2014-06-28 MED ORDER — PREDNISONE 50 MG PO TABS: ORAL_TABLET | ORAL | Status: DC

## 2014-06-28 MED ORDER — ALBUTEROL SULFATE HFA 108 (90 BASE) MCG/ACT IN AERS: 2.0000 | INHALATION_SPRAY | Freq: Four times a day (QID) | RESPIRATORY_TRACT | Status: DC | PRN

## 2014-06-28 MED ORDER — ALBUTEROL SULFATE (2.5 MG/3ML) 0.083% IN NEBU: 2.5000 mg | INHALATION_SOLUTION | Freq: Four times a day (QID) | RESPIRATORY_TRACT | Status: DC | PRN

## 2014-06-28 MED ORDER — MOMETASONE FURO-FORMOTEROL FUM 200-5 MCG/ACT IN AERO: 2.0000 | INHALATION_SPRAY | Freq: Two times a day (BID) | RESPIRATORY_TRACT | Status: DC

## 2014-06-28 NOTE — Discharge Instructions (Signed)

## 2014-06-28 NOTE — Discharge Summary (Signed)
Physician Discharge Summary  April Ellis:[id number] DOB: [birth year] DOA: 06/27/2014  PCP: [name], MD  Admit date: 06/27/2014 Discharge date: 06/28/2014  Recommendations for Outpatient Follow-up:  1. take Levaquin for 6 days on discharge for treatment of possible pneumonia or bronchitis. 2. take prednisone 50 mg daily for 5 days and then stop. Prescription provided for 10 tablets. In case you have another flareup of COPD exacerbation you may take prednisone 50 mg once a day for 5 days. If her symptoms do not improve please contact your primary care physician or come to emergency room for evaluation.   Discharge Diagnoses:  Active Problems:   Morbid obesity   COPD exacerbation   Tobacco abuse   Atypical chest pain    Discharge Condition: stable   Diet recommendation: as tolerated   History of present illness:  38 year old female with history of COPD who presented to [facility] ED 06/27/2014 with progressively worsening shortness of breath over past 3 days prior to this admission. Patient reported having upper respiratory tract infection for 2 weeks prior to this admission. She thought she was initially improving but then started to get more short of breath and has had cough productive of clear sputum. Patient reported taking her Proventil more frequently as well as nebulizer treatment with no significant symptomatic relief.  In the emergency department, the patient was noted to have oxygen saturation of 83% with ambulation. The patient was given nebulizer treatment with some improvement of her shortness of breath. BMP and hepatic panel are unremarkable. CBC revealed microcytic anemia with hemoglobin 9.9. Chest x-ray was negative for any acute findings. ABG revealed 7.36/46/61/25 on 2 L. Pt had PFTs on 05/01/14 showing FEV1 of 55% predicted.   Assessment/Plan:   Principal Problem: Acute respiratory failure with hypoxia   Acute hypoxia likely secondary to COPD exacerbation. This  morning, patient feels great. No wheezing or crackles on physical exam.   Prescription provided for Proventil inhaler, albuterol nebulizer, Dulera. Patient will be discharged with prednisone 50 mg daily for 5 days. Patient will also continue Levaquin for 6 more days on discharge for treatment of possible pneumonia or bronchitis.   Patient does not take oxygen at home. Her oxygen saturation is 96%. Patient does not require oxygen on discharge.  COPD exacerbation    Management as above with combination of inhalers and nebulizer treatments. On discharge, patient will continue steroids and antibiotics.  Atypical chest pain    Likely musculoskeletal pain. Troponins are negative. The 12-lead EKG showed sinus rhythm on the admission.  Morbid obesity    BMI 66. Patient counseled on diet, exercise.  Lower extremity Edema   Lower extremity Doppler did not reveal DVT    Signed:  Leisa Lenz, MD  [facility] 06/28/2014, 11:04 AM  Pager #: [phone]  Procedures:  LE doppler, negative for DVT  Consultations:  None   Discharge Exam: Filed Vitals:   06/28/14 0522  BP: 106/52  Pulse: 63  Temp: 97.8 F (36.6 C)  Resp: 18   Filed Vitals:   06/27/14 1926 06/27/14 2050 06/28/14 0206 06/28/14 0522  BP:  137/88  106/52  Pulse:  66  63  Temp:  98.2 F (36.8 C)  97.8 F (36.6 C)  TempSrc:  Oral  Oral  Resp:  18  18  Height:      Weight:      SpO2: 92% 98% 92% 96%    General: Pt is alert, follows commands appropriately, not in acute distress Cardiovascular: Regular rate  and rhythm, S1/S2 +, no murmurs Respiratory: Clear to auscultation bilaterally, no wheezing, no crackles, no rhonchi Abdominal: Soft, non tender, non distended, bowel sounds +, no guarding Extremities: no edema, no cyanosis, pulses palpable bilaterally DP and PT Neuro: Grossly nonfocal  Discharge Instructions  Discharge Instructions   Call MD for:  difficulty breathing, headache or visual  disturbances    Complete by:  As directed      Call MD for:  persistant nausea and vomiting    Complete by:  As directed      Call MD for:  redness, tenderness, or signs of infection (pain, swelling, redness, odor or green/yellow discharge around incision site)    Complete by:  As directed      Call MD for:  temperature >100.4    Complete by:  As directed      Diet - low sodium heart healthy    Complete by:  As directed      Discharge instructions    Complete by:  As directed   1. take Levaquin for 6 days on discharge for treatment of possible pneumonia or bronchitis. 2. take prednisone 50 mg daily for 5 days and then stop. Prescription provided for 10 tablets. In case you have another flareup of COPD exacerbation you may take prednisone 50 mg once a day for 5 days. If her symptoms do not improve please contact your primary care physician or come to emergency room for evaluation.     Increase activity slowly    Complete by:  As directed             Medication List         albuterol 108 (90 BASE) MCG/ACT inhaler  Commonly known as:  PROVENTIL HFA;VENTOLIN HFA  Inhale 2 puffs into the lungs every 6 (six) hours as needed for wheezing or shortness of breath.     albuterol (2.5 MG/3ML) 0.083% nebulizer solution  Commonly known as:  PROVENTIL  Take 3 mLs (2.5 mg total) by nebulization every 6 (six) hours as needed for wheezing or shortness of breath. Use 3 times daily for 4 days then use as needed.     guaiFENesin 600 MG 12 hr tablet  Commonly known as:  MUCINEX  Take 2 tablets (1,200 mg total) by mouth 2 (two) times daily. Use for 5 days then stop     levofloxacin 750 MG tablet  Commonly known as:  LEVAQUIN  Take 1 tablet (750 mg total) by mouth daily.     mometasone-formoterol 200-5 MCG/ACT Aero  Commonly known as:  DULERA  Inhale 2 puffs into the lungs 2 (two) times daily.     predniSONE 50 MG tablet  Commonly known as:  DELTASONE  Take prednisone 50 mg daily once a day for  5 days and then stop.           Follow-up Information   Follow up with [name], MD. Schedule an appointment as soon as possible for a visit in 1 week. (Follow up appt after recent hospitalization)    Specialties:  Internal Medicine, Radiology   Contact information:   [place] [id number] [phone]        The results of significant diagnostics from this hospitalization (including imaging, microbiology, ancillary and laboratory) are listed below for reference.    Significant Diagnostic Studies: Dg Chest Port 1 View  06/27/2014   CLINICAL DATA:  Asthma, shortness of breath.  EXAM: PORTABLE CHEST - 1 VIEW  COMPARISON:  Chest radiograph May 12, 2014  FINDINGS: The cardiac silhouette may be mildly enlarged, even with consideration to this AP technique. Mediastinal silhouette is nonsuspicious. No pleural effusions or focal consolidations. No pneumothorax. Large body habitus. Osseous structures are nonsuspicious.  IMPRESSION: Suspected mild cardiomegaly.  No acute pulmonary process.   Electronically Signed   By: Elon Alas   On: 06/27/2014 03:45    Microbiology: No results found for this or any previous visit (from the past 240 hour(s)).   Labs: Basic Metabolic Panel:  Recent Labs Lab 06/27/14 0618 06/27/14 1158 06/28/14 0518  NA 141  --  138  K 4.6  --  5.0  CL 105  --  101  CO2 24  --  27  GLUCOSE 128*  --  159*  BUN 6  --  14  CREATININE 0.65  --  0.68  CALCIUM 8.7  --  9.3  MG  --  2.1  --    Liver Function Tests:  Recent Labs Lab 06/27/14 0618  AST 13  ALT 10  ALKPHOS 106  BILITOT 0.6  PROT 8.1  ALBUMIN 3.4*   No results found for this basename: LIPASE, AMYLASE,  in the last 168 hours No results found for this basename: AMMONIA,  in the last 168 hours CBC:  Recent Labs Lab 06/27/14 0618  WBC 9.1  NEUTROABS 8.5*  HGB 9.9*  HCT 34.4*  MCV 71.4*  PLT 305   Cardiac Enzymes:  Recent Labs Lab 06/27/14 1158  06/27/14 1822 06/28/14 0010  TROPONINI <0.30 <0.30 <0.30   BNP: BNP (last 3 results) No results found for this basename: PROBNP,  in the last 8760 hours CBG: No results found for this basename: GLUCAP,  in the last 168 hours  Time coordinating discharge: Over 30 minutes

## 2014-06-28 NOTE — Progress Notes (Signed)
Patient was stable at time of discharge. IV removed. I reviewed discharge education with patient. She verbalized understanding and had no further questions. I gave patient her prescriptions.

## 2014-07-02 NOTE — ED Provider Notes (Signed)
Medical screening examination/treatment/procedure(s) were conducted as a shared visit with non-physician practitioner(s) and myself.  I personally evaluated the patient during the encounter.   EKG Interpretation   Date/Time:  Thursday June 27 2014 02:05:03 EDT Ventricular Rate:  94 PR Interval:  139 QRS Duration: 88 QT Interval:  352 QTC Calculation: 440 R Axis:   51 Text Interpretation:  Sinus rhythm Low voltage, precordial leads No acute  findings Unchanged from prior Confirmed by Herbert Aguinaldo, MD, Baird Polinski ([id number]) on  06/27/2014 2:48:03 AM      Pt comes in with DIB. Hx of COPD, morbid obesity. Pt is hypoxic on room air. + wheezing. Tx to be initiated for CODP exacerbation, and reassessed for admission.   Varney Biles, MD 07/02/14 1758

## 2014-07-19 ENCOUNTER — Telehealth: Payer: Self-pay

## 2014-07-25 ENCOUNTER — Ambulatory Visit (INDEPENDENT_AMBULATORY_CARE_PROVIDER_SITE_OTHER): Payer: No Typology Code available for payment source | Admitting: Family Medicine

## 2014-07-25 VITALS — BP 124/60 | HR 104 | Temp 98.1°F | Resp 18 | Ht 62.0 in | Wt 379.2 lb

## 2014-07-25 DIAGNOSIS — Z760 Encounter for issue of repeat prescription: Secondary | ICD-10-CM

## 2014-07-25 DIAGNOSIS — R062 Wheezing: Secondary | ICD-10-CM

## 2014-07-25 DIAGNOSIS — R0602 Shortness of breath: Secondary | ICD-10-CM

## 2014-07-25 DIAGNOSIS — J4541 Moderate persistent asthma with (acute) exacerbation: Secondary | ICD-10-CM

## 2014-07-25 MED ORDER — PREDNISONE 20 MG PO TABS: ORAL_TABLET | ORAL | Status: DC

## 2014-07-25 MED ORDER — ALBUTEROL SULFATE HFA 108 (90 BASE) MCG/ACT IN AERS: 2.0000 | INHALATION_SPRAY | Freq: Four times a day (QID) | RESPIRATORY_TRACT | Status: DC | PRN

## 2014-07-25 MED ORDER — ALBUTEROL SULFATE (2.5 MG/3ML) 0.083% IN NEBU: 2.5000 mg | INHALATION_SOLUTION | Freq: Four times a day (QID) | RESPIRATORY_TRACT | Status: DC | PRN

## 2014-07-25 MED ORDER — ALBUTEROL SULFATE (2.5 MG/3ML) 0.083% IN NEBU
2.5000 mg | INHALATION_SOLUTION | Freq: Once | RESPIRATORY_TRACT | Status: AC
  Administered 2014-07-25: 2.5 mg via RESPIRATORY_TRACT

## 2014-07-25 NOTE — Progress Notes (Signed)
Subjective: Patient has long hx of copd/asthma.  Out of her albuterol inhaler and neb solution.  Breathing worse than usual but not bad.  Has had several episodes of problems.  She has had to be hospitalized since I saw her. She continues to work. She continues to smoke though is smoking less says.  Objective: Morbidly obese. Throat clear. Neck supple without nodes. Chest is very poor air movement with prolonged expiratory phase. Heart regular without any murmurs. Has a little ankle edema.  Unable to move the peak flow meter initially. Was given hand-held nebulizer with albuterol. She was reexamined. The peak flow went up to 160 at its best.  Assessment: Asthma, exacerbation Smoker Morbid obesity  Plan: Discussed with her her need for responsibility and lifestyle change. Refilled her medications. Started on a course of prednisone. I do not believe she needs antibiotics at this time. Return if worse or go to the emergency room.

## 2014-07-25 NOTE — Patient Instructions (Signed)
Take prednisone 3 daily for 2 days, then 2 daily for 2 days, then 1 daily for 2 days  Continue using your albuterol inhaler and nebulizer. Do not exceed every 4-6 hours.  Return if worse or go to the emergency room.

## 2014-08-05 NOTE — Telephone Encounter (Signed)
No msg °

## 2014-08-13 ENCOUNTER — Inpatient Hospital Stay (HOSPITAL_COMMUNITY)
Admission: EM | Admit: 2014-08-13 | Discharge: 2014-08-14 | DRG: 202 | Disposition: A | Discharge status: Home / Self Care | Payer: No Typology Code available for payment source | Attending: Internal Medicine | Admitting: Internal Medicine

## 2014-08-13 ENCOUNTER — Ambulatory Visit (INDEPENDENT_AMBULATORY_CARE_PROVIDER_SITE_OTHER): Payer: No Typology Code available for payment source | Admitting: Family Medicine

## 2014-08-13 ENCOUNTER — Ambulatory Visit (INDEPENDENT_AMBULATORY_CARE_PROVIDER_SITE_OTHER): Payer: No Typology Code available for payment source

## 2014-08-13 ENCOUNTER — Encounter (HOSPITAL_COMMUNITY): Payer: Self-pay | Admitting: Emergency Medicine

## 2014-08-13 VITALS — BP 142/88 | HR 93 | Temp 98.7°F | Resp 18 | Ht 62.0 in | Wt 378.6 lb

## 2014-08-13 DIAGNOSIS — R0902 Hypoxemia: Secondary | ICD-10-CM | POA: Diagnosis present

## 2014-08-13 DIAGNOSIS — Z6841 Body Mass Index (BMI) 40.0 and over, adult: Secondary | ICD-10-CM | POA: Diagnosis not present

## 2014-08-13 DIAGNOSIS — J449 Chronic obstructive pulmonary disease, unspecified: Secondary | ICD-10-CM | POA: Diagnosis present

## 2014-08-13 DIAGNOSIS — R062 Wheezing: Secondary | ICD-10-CM

## 2014-08-13 DIAGNOSIS — Z87891 Personal history of nicotine dependence: Secondary | ICD-10-CM | POA: Diagnosis not present

## 2014-08-13 DIAGNOSIS — Z792 Long term (current) use of antibiotics: Secondary | ICD-10-CM | POA: Diagnosis not present

## 2014-08-13 DIAGNOSIS — J40 Bronchitis, not specified as acute or chronic: Secondary | ICD-10-CM | POA: Diagnosis present

## 2014-08-13 DIAGNOSIS — D509 Iron deficiency anemia, unspecified: Secondary | ICD-10-CM | POA: Diagnosis present

## 2014-08-13 DIAGNOSIS — G4733 Obstructive sleep apnea (adult) (pediatric): Secondary | ICD-10-CM | POA: Diagnosis present

## 2014-08-13 DIAGNOSIS — J4541 Moderate persistent asthma with (acute) exacerbation: Secondary | ICD-10-CM | POA: Diagnosis present

## 2014-08-13 DIAGNOSIS — J4540 Moderate persistent asthma, uncomplicated: Secondary | ICD-10-CM | POA: Diagnosis present

## 2014-08-13 DIAGNOSIS — J209 Acute bronchitis, unspecified: Secondary | ICD-10-CM | POA: Diagnosis present

## 2014-08-13 DIAGNOSIS — R0602 Shortness of breath: Secondary | ICD-10-CM | POA: Diagnosis not present

## 2014-08-13 LAB — BLOOD GAS, ARTERIAL
ACID-BASE EXCESS: 0.7 mmol/L (ref 0.0–2.0)
Bicarbonate: 26 mEq/L — ABNORMAL HIGH (ref 20.0–24.0)
Drawn by: 11249
O2 CONTENT: 2 L/min
O2 SAT: 89 %
PCO2 ART: 47.5 mmHg — AB (ref 35.0–45.0)
Patient temperature: 98.6
TCO2: 24.1 mmol/L (ref 0–100)
pH, Arterial: 7.358 (ref 7.350–7.450)
pO2, Arterial: 60 mmHg — ABNORMAL LOW (ref 80.0–100.0)

## 2014-08-13 LAB — CBC WITH DIFFERENTIAL/PLATELET
Basophils Absolute: 0 10*3/uL (ref 0.0–0.1)
Basophils Relative: 0 % (ref 0–1)
EOS ABS: 0 10*3/uL (ref 0.0–0.7)
EOS PCT: 0 % (ref 0–5)
HCT: 35.9 % — ABNORMAL LOW (ref 36.0–46.0)
Hemoglobin: 10.4 g/dL — ABNORMAL LOW (ref 12.0–15.0)
LYMPHS ABS: 0.5 10*3/uL — AB (ref 0.7–4.0)
Lymphocytes Relative: 4 % — ABNORMAL LOW (ref 12–46)
MCH: 21.1 pg — AB (ref 26.0–34.0)
MCHC: 29 g/dL — AB (ref 30.0–36.0)
MCV: 73 fL — AB (ref 78.0–100.0)
MONO ABS: 0.2 10*3/uL (ref 0.1–1.0)
Monocytes Relative: 2 % — ABNORMAL LOW (ref 3–12)
NEUTROS PCT: 94 % — AB (ref 43–77)
Neutro Abs: 11.2 10*3/uL — ABNORMAL HIGH (ref 1.7–7.7)
PLATELETS: 231 10*3/uL (ref 150–400)
RBC: 4.92 MIL/uL (ref 3.87–5.11)
RDW: 18.3 % — ABNORMAL HIGH (ref 11.5–15.5)
WBC: 11.9 10*3/uL — ABNORMAL HIGH (ref 4.0–10.5)

## 2014-08-13 LAB — BASIC METABOLIC PANEL
Anion gap: 13 (ref 5–15)
BUN: 9 mg/dL (ref 6–23)
CALCIUM: 9.3 mg/dL (ref 8.4–10.5)
CO2: 23 meq/L (ref 19–32)
CREATININE: 0.58 mg/dL (ref 0.50–1.10)
Chloride: 98 mEq/L (ref 96–112)
GFR calc Af Amer: >90 mL/min (ref >90)
Glucose, Bld: 116 mg/dL — ABNORMAL HIGH (ref 70–99)
Potassium: 4.1 mEq/L (ref 3.7–5.3)
SODIUM: 134 meq/L — AB (ref 137–147)

## 2014-08-13 MED ORDER — ONDANSETRON HCL 4 MG PO TABS
4.0000 mg | ORAL_TABLET | Freq: Four times a day (QID) | ORAL | Status: DC | PRN
Start: 2014-08-13 — End: 2014-08-14

## 2014-08-13 MED ORDER — METHYLPREDNISOLONE SODIUM SUCC 125 MG IJ SOLR
60.0000 mg | Freq: Four times a day (QID) | INTRAMUSCULAR | Status: DC
  Administered 2014-08-14 (×2): 60 mg via INTRAVENOUS
  Filled 2014-08-13 (×2): qty 2

## 2014-08-13 MED ORDER — OXYCODONE HCL 5 MG PO TABS: 5.0000 mg | ORAL_TABLET | ORAL | Status: DC | PRN

## 2014-08-13 MED ORDER — FOLIC ACID 1 MG PO TABS
1.0000 mg | ORAL_TABLET | Freq: Every day | ORAL | Status: DC
  Administered 2014-08-14: 1 mg via ORAL
  Filled 2014-08-13: qty 1

## 2014-08-13 MED ORDER — ALBUTEROL (5 MG/ML) CONTINUOUS INHALATION SOLN
10.0000 mg/h | INHALATION_SOLUTION | RESPIRATORY_TRACT | Status: DC
  Administered 2014-08-13: 10 mg/h via RESPIRATORY_TRACT
  Filled 2014-08-13: qty 20

## 2014-08-13 MED ORDER — ADULT MULTIVITAMIN W/MINERALS CH
1.0000 | ORAL_TABLET | Freq: Every day | ORAL | Status: DC
  Administered 2014-08-14: 1 via ORAL
  Filled 2014-08-13: qty 1

## 2014-08-13 MED ORDER — SODIUM CHLORIDE 0.9 % IJ SOLN
3.0000 mL | Freq: Two times a day (BID) | INTRAMUSCULAR | Status: DC
  Administered 2014-08-14: 3 mL via INTRAVENOUS

## 2014-08-13 MED ORDER — IPRATROPIUM BROMIDE 0.02 % IN SOLN
0.5000 mg | Freq: Once | RESPIRATORY_TRACT | Status: AC
  Administered 2014-08-13: 0.5 mg via RESPIRATORY_TRACT

## 2014-08-13 MED ORDER — VITAMIN B-1 100 MG PO TABS
100.0000 mg | ORAL_TABLET | Freq: Every day | ORAL | Status: DC
  Administered 2014-08-14: 100 mg via ORAL
  Filled 2014-08-13: qty 1

## 2014-08-13 MED ORDER — METHYLPREDNISOLONE SODIUM SUCC 125 MG IJ SOLR
125.0000 mg | Freq: Once | INTRAMUSCULAR | Status: AC
  Administered 2014-08-13: 125 mg via INTRAVENOUS

## 2014-08-13 MED ORDER — HEPARIN SODIUM (PORCINE) 5000 UNIT/ML IJ SOLN
5000.0000 [IU] | Freq: Three times a day (TID) | INTRAMUSCULAR | Status: DC
  Administered 2014-08-14 (×2): 5000 [IU] via SUBCUTANEOUS
  Filled 2014-08-13 (×2): qty 1

## 2014-08-13 MED ORDER — ALBUTEROL SULFATE (2.5 MG/3ML) 0.083% IN NEBU
2.5000 mg | INHALATION_SOLUTION | Freq: Four times a day (QID) | RESPIRATORY_TRACT | Status: DC
  Administered 2014-08-14 (×2): 2.5 mg via RESPIRATORY_TRACT
  Filled 2014-08-13 (×3): qty 3

## 2014-08-13 MED ORDER — ALBUTEROL SULFATE (2.5 MG/3ML) 0.083% IN NEBU
2.5000 mg | INHALATION_SOLUTION | Freq: Once | RESPIRATORY_TRACT | Status: AC
  Administered 2014-08-13: 2.5 mg via RESPIRATORY_TRACT

## 2014-08-13 MED ORDER — IPRATROPIUM BROMIDE 0.02 % IN SOLN
0.5000 mg | Freq: Four times a day (QID) | RESPIRATORY_TRACT | Status: DC
  Administered 2014-08-14 (×2): 0.5 mg via RESPIRATORY_TRACT
  Filled 2014-08-13 (×3): qty 2.5

## 2014-08-13 MED ORDER — MOMETASONE FURO-FORMOTEROL FUM 200-5 MCG/ACT IN AERO
2.0000 | INHALATION_SPRAY | Freq: Two times a day (BID) | RESPIRATORY_TRACT | Status: DC
  Administered 2014-08-14 (×2): 2 via RESPIRATORY_TRACT
  Filled 2014-08-13: qty 8.8

## 2014-08-13 MED ORDER — ZOLPIDEM TARTRATE 5 MG PO TABS: 5.0000 mg | ORAL_TABLET | Freq: Every evening | ORAL | Status: DC | PRN

## 2014-08-13 MED ORDER — SODIUM CHLORIDE 0.9 % IV SOLN
INTRAVENOUS | Status: DC
  Administered 2014-08-14: 50 mL/h via INTRAVENOUS

## 2014-08-13 MED ORDER — CEFTRIAXONE SODIUM 1 G IJ SOLR
1.0000 g | INTRAMUSCULAR | Status: DC
  Administered 2014-08-14: 1 g via INTRAVENOUS
  Filled 2014-08-13: qty 10

## 2014-08-13 MED ORDER — ONDANSETRON HCL 4 MG/2ML IJ SOLN: 4.0000 mg | Freq: Four times a day (QID) | INTRAMUSCULAR | Status: DC | PRN

## 2014-08-13 NOTE — ED Notes (Addendum)
Floor Unit RN/Secretary notified of pt's admission to [id number].----- this note was entered by mistake.  Pt has no bed assignment yet.

## 2014-08-13 NOTE — Progress Notes (Signed)
  CARE MANAGEMENT ED NOTE 08/13/2014  Patient:  April Ellis, April Ellis   Account Number:  [id number]  Date Initiated:  08/13/2014  Documentation initiated by:  Livia Snellen  Subjective/Objective Assessment:   Patient presents to Ed with increased shortness of breath     Subjective/Objective Assessment Detail:   Patient with pmhx of COPD, asthma.  Patient was admitted to the hospital from 10/01 to 10/02 with shortness of breath, COPD exacerbation     Action/Plan:   Action/Plan Detail:   Anticipated DC Date:       Status Recommendation to Physician:   Result of Recommendation:    Other ED Services  Consult Working [place]  CM consult  Medication Assistance  Other    Choice offered to / List presented to:            Status of service:  Completed, signed off  ED Comments:   ED Comments Detail:  EDCM spoke to patient at bedside.  Patient listed as having [employer] insurnace.  Patient confirms her pulonologist is Dr. Chase Caller.  Patient reports the last time she had seen her pulmonologist was in August.  Patient lives at home with her sister and her son.  Patient reports she does NOT wear oxygen at home.  Patient confirms she has a nebulizer machine at home.  Patient reports her pcp is at the [facility] on [address] [place].  She usually sees Dr.Hopper.  Patient reports when she was discharged in October, se was unable to get her prescription filled for the Levaquin as it was too expensive.  Even with [facility], the patient would have to pay 100 dollars out of pocket per patient. Patient reports she was able to get everything else filled. Patient gets her RX's filled at [facility].  Patient reports her oxygen level on exertion went down very low at her pcp's office today and was sent here.  No further EDCM needs at this time.

## 2014-08-13 NOTE — Patient Instructions (Signed)
Due to your low oxygen - hospital evaluation needed, possible admission. You were given 2 doses of albuterol and atrovent nebs in office, as well as Solumedrol 125mg  IV (steroid).

## 2014-08-13 NOTE — H&P (Signed)
[facility] History and Physical  April Ellis FXT:[id number] DOB: Apr 25, 1976 DOA: 08/13/2014  Referring physician: Tawnya Crook PCP: [name], MD   Chief Complaint: Short of Breath  HPI: April Ellis is a 38 y.o. female presents with asthma exacerbation. Patient states that she has noted increased SOB since yesterday. Patient has had cough and wheeze also. She has had prior admission for asthma. She admits to smoking. She states that she has some sputum no hemoptysis noted. She had some tightness in her chest. She was seen in an urgent care and ws sent to the ED. This was due to the fact that she was still hypoxic evan after aggressive bronchodilator therapy and steroid injection. Currently she appears more comfortable. I asked for an ABG to be done and there was some increase in the pCO2 noted. Will place in step down overnight though she is fairly stable presently.   Review of Systems:  Constitutional:  No weight loss, night sweats, Fevers, chills, fatigue.  HEENT:  No headaches, Difficulty swallowing,Tooth/dental problems,Sore throat,  No sneezing, itching, ear ache,  Cardio-vascular:  No chest pain, Orthopnea, PND GI:  No heartburn, indigestion, abdominal pain, nausea, vomiting, diarrhea  Resp:  ++shortness of breath with exertion and at rest. no productive cough, No coughing up of blood Skin:  no rash or lesions.  GU:  no dysuria, change in color of urine, no urgency or frequency. No flank pain.  Musculoskeletal:  No joint pain or swelling. No decreased range of motion. No back pain.  Psych:  No change in mood or affect. No depression or anxiety. No memory loss.   Past Medical History  Diagnosis Date  . Anemia   . Bronchitis   . Asthma   . Obesity    Past Surgical History  Procedure Laterality Date  . No past surgeries     Social History:  reports that she quit smoking about 3 weeks ago. Her smoking use included Cigarettes. She has a 3.75 pack-year  smoking history. She has never used smokeless tobacco. She reports that she does not drink alcohol or use illicit drugs.  No Known Allergies  Family History  Problem Relation Age of Onset  . Diabetes Mother   . Diabetes Sister   . Hypertension Sister   . Hypertension Sister      Prior to Admission medications   Medication Sig Start Date End Date Taking? Authorizing Provider  albuterol (PROVENTIL HFA;VENTOLIN HFA) 108 (90 BASE) MCG/ACT inhaler Inhale 2 puffs into the lungs every 6 (six) hours as needed for wheezing or shortness of breath. 07/25/14   Posey Boyer, MD  albuterol (PROVENTIL) (2.5 MG/3ML) 0.083% nebulizer solution Take 3 mLs (2.5 mg total) by nebulization every 6 (six) hours as needed for wheezing or shortness of breath. Use 3 times daily for 4 days then use as needed. 07/25/14   Posey Boyer, MD  guaiFENesin (MUCINEX) 600 MG 12 hr tablet Take 2 tablets (1,200 mg total) by mouth 2 (two) times daily. Use for 5 days then stop 05/14/14   Eugenie Filler, MD  mometasone-formoterol [facility]) 200-5 MCG/ACT AERO Inhale 2 puffs into the lungs 2 (two) times daily. 06/28/14   Robbie Lis, MD   Physical Exam: Filed Vitals:   08/13/14 1947 08/13/14 2000 08/13/14 2030 08/13/14 2054  BP:  122/66 128/70 139/91  Pulse:  72 93 113  Temp:      TempSrc:      Resp:  12 13 20   SpO2: 93%  100% 100% 92%    Wt Readings from Last 3 Encounters:  08/13/14 171.732 kg (378 lb 9.6 oz)  07/25/14 172.004 kg (379 lb 3.2 oz)  06/27/14 165.563 kg (365 lb)    General:  Appears calm and comfortable Eyes: PERRL, normal lids, irises & conjunctiva ENT: grossly normal hearing, lips & tongue Neck: no LAD, masses or thyromegaly Cardiovascular: RRR, no m/r/g. No LE edema. Respiratory: Few ronchi noted Normal respiratory effort. Abdomen: soft, ntnd Skin: no rash or induration seen on limited exam Musculoskeletal: grossly normal tone BUE/BLE Psychiatric: grossly normal mood and affect, speech fluent  and appropriate Neurologic: grossly non-focal.          Labs on Admission:  Basic Metabolic Panel:  Recent Labs Lab 08/13/14 1947  NA 134*  K 4.1  CL 98  CO2 23  GLUCOSE 116*  BUN 9  CREATININE 0.58  CALCIUM 9.3   Liver Function Tests: No results for input(s): AST, ALT, ALKPHOS, BILITOT, PROT, ALBUMIN in the last 168 hours. No results for input(s): LIPASE, AMYLASE in the last 168 hours. No results for input(s): AMMONIA in the last 168 hours. CBC:  Recent Labs Lab 08/13/14 1947  WBC 11.9*  NEUTROABS 11.2*  HGB 10.4*  HCT 35.9*  MCV 73.0*  PLT 231   Cardiac Enzymes: No results for input(s): CKTOTAL, CKMB, CKMBINDEX, TROPONINI in the last 168 hours.  BNP (last 3 results) No results for input(s): PROBNP in the last 8760 hours. CBG: No results for input(s): GLUCAP in the last 168 hours.  Radiological Exams on Admission: Dg Chest 2 View  08/13/2014   CLINICAL DATA:  Wheezing  EXAM: CHEST  2 VIEW  COMPARISON:  Portable chest x-ray of June 27, 2014  FINDINGS: The lungs are mildly hyperinflated. There is no focal infiltrate. The cardiac silhouette is top-normal in size but stable. The central pulmonary vascularity is prominent but less conspicuous than on the previous study. The trachea is midline. There is no pleural effusion or pneumothorax. The bony thorax is unremarkable.  IMPRESSION: There is mild hyperinflation which may reflect reactive airway disease and acute bronchitis. There is no focal pneumonia.   Electronically Signed   By: David  Martinique   On: 08/13/2014 15:34      Assessment/Plan Principal Problem:   Moderate persistent asthma with acute exacerbation in adult Active Problems:   Morbid obesity   OSA (obstructive sleep apnea)   1. Moderate persistent asthma with exacerbation -patient comes in with an acute exacerbation -will start on bronchodilator therapy -will start on IV steroids -will start on rocephin daily -Got an ABG has increased pCO2  will place on step down for now  2. Morbid Obesity -discussed diet and weight loss  3. Probable OSA -she states that they have been trying to get a sleep study on her -she is currently not on CPAP -I have asked for a CPAP at the bedside for her to use  4. Anemia -appears to be microcytic -will check iron studies  5. Tobacco Use -will start on nicotine patch -smoking cessation was discussed with the patient    Code Status: Full Code (must indicate code status--if unknown or must be presumed, indicate so) DVT Prophylaxis:Heparin Family Communication: None (indicate person spoken with, if applicable, with phone number if by telephone) Disposition Plan: Home (indicate anticipated LOS)  Time spent: 35min  KHAN,SAADAT A [facility] Pager [phone]

## 2014-08-13 NOTE — Progress Notes (Addendum)
Subjective:    Patient ID: April Ellis, female    DOB: 04-18-1976, 38 y.o.   MRN: [id number] This chart was scribed for Merri Ray, MD by Zola Button, Medical Scribe. This patient was seen in Room 3 and the patient's care was started at 2:08 PM.    HPI HPI Comments: April Ellis is a 38 y.o. female with a hx of COPD, morbid obesity and OSA who presents to the [facility] complaining of gradual onset URI symptoms that began last night.  PCP is [name], MD. She is here for cough and wheeze. She has hx of morbid obesity with BMI of 69, OSA and COPD. She was admitted Oct 1st through Oct 2nd for COPD exacerbation. She was treated with Levaquin and prednisone. At that visit, her O2 Sat had dropped to 83% with ambulation. Discharge O2 Sat was 96% on room air and does not use O2 at home. She was then seen here on 10/29, diagnosed with asthma exacerbation; repeated course of prednisone taper from 60mg  to 20mg  over 6 days, and to continue inhalers, including Dulera. Her O2 Sat was 91% at that visit on 10/29. She was last seen by her pulmonologist on 8/5 for COPD with planned follow-up in 6 weeks from then. Pulmonary is Dr. Tilden Fossa. She was dx with COPD moderate/gold 2 with possible asthma component.  Patient reports coughing, congestion, wheezing and SOB that began last night. She reports using her albuterol inhaler every 2 hours last night and today; before yesterday when she started feeling sick, she was using it only about once a day. Patient denies missing any doses of Dulera the past week. She denies fever, chest pain, and leg swelling.  Review of Systems  Constitutional: Negative for fever.  HENT: Positive for congestion.   Respiratory: Positive for cough, shortness of breath and wheezing.   Cardiovascular: Negative for chest pain and leg swelling.       Objective:   Physical Exam  Constitutional: She is oriented to person, place, and time. She appears  well-developed and well-nourished. No distress.  HENT:  Head: Normocephalic and atraumatic.  Right Ear: Hearing, tympanic membrane, external ear and ear canal normal.  Left Ear: Hearing, tympanic membrane, external ear and ear canal normal.  Nose: Nose normal.  Mouth/Throat: Oropharynx is clear and moist. No oropharyngeal exudate.  Eyes: Conjunctivae and EOM are normal. Pupils are equal, round, and reactive to light.  Cardiovascular: Normal rate, regular rhythm, normal heart sounds and intact distal pulses.   No murmur heard. Pulmonary/Chest: No respiratory distress. She has wheezes. She has no rhonchi.  Distant breath sounds with prolonged expiratory. Diffuse wheeze. Mild supraclavicular retractions.  Musculoskeletal: She exhibits edema.  Trace pedal edema.  Neurological: She is alert and oriented to person, place, and time.  Skin: Skin is warm and dry. No rash noted.  Psychiatric: She has a normal mood and affect. Her behavior is normal.  Vitals reviewed.  Filed Vitals:   08/13/14 1338  BP: 142/88  Pulse: 93  Temp: 98.7 F (37.1 C)  TempSrc: Oral  Resp: 18  Height: 5\' 2"  (1.575 m)  Weight: 378 lb 9.6 oz (171.732 kg)  SpO2: 90%    [facility] preliminary x-ray report read by Dr. Carlota Raspberry: Chest x-ray: no apparent infiltrate, or acute changes from prior.   After albuterol 2.5mg  and atrovent 0.5mg  neb, ambulatory pulse ox -low of 84%, high of 90%. Minimal subjective improvement - wheezing, slightly less short of breath.  On exam - some increased aeration, but still distant with faint expiratory wheeze and some upper airway noise/wheeze anterior, upper chest wall. Still some supraclavicular retraction.   3:34 PM Repeat ambulatory testing - 90=91%at rest on room air. 02 sat dropped to 60% on RA with 1 lap - denied increasing dyspnea, lightheadedness or dizziness with this. Placed on O2 [place] at 2 liters, sat increased to 96% on RA. 2nd round of albuterol 2.5mg , atrovent 0.5mg  neb started.  Will  plan on IV and if able to obtain, solumedrol 125mg  IV, then transport to ER.   3:57 PM IV placed, starting Solumedrol 125mg  IV, EMS called for transport. Charge nurse advised.      Assessment & Plan:   April Ellis is a 38 y.o. female Wheezing - Plan: DG Chest 2 View, albuterol (PROVENTIL) (2.5 MG/3ML) 0.083% nebulizer solution 2.5 mg, ipratropium (ATROVENT) nebulizer solution 0.5 mg, methylPREDNISolone sodium succinate (SOLU-MEDROL) 125 mg/2 mL injection 125 mg   Suspected initial URI progressed to COPD exacerbation and asthma and possible OHS with obesity and BMI of 69. Home treatment of albuterol nebs every 2 hours overnight without relief, borderline hypoxia at rest and hypoxia/respiratory failure with ambulation with drop in sat as above. Remained alert/oriented, just increased dyspnea with ambulation. No significant improvement with 2 nebulizer treatments with atrovent in office.   -O2NC at 2 liters, placed IV, solumedrol 125mg  IV, called EMS for transport to ER and charge nurse at [facility] advised.   4:15 PM: Feeling mshort of breath - sat 90-91%.  Still wheezing - 3rd albuterol 2.5mg  neb started.   4:28 PM: EMS on scene, report given and transfer of care.  Patient with increasing dyspnea. But alert and oriented. Placed on CPAP with neb by EMS. Pt wanted [facility] eval. Advised charge nurse at [facility] and cancelled [facility].   Total of 7.5mg  albuterol, 1.0mg  atrovent given   Meds ordered this encounter  Medications  . albuterol (PROVENTIL) (2.5 MG/3ML) 0.083% nebulizer solution 2.5 mg    Sig:   . ipratropium (ATROVENT) nebulizer solution 0.5 mg    Sig:   . methylPREDNISolone sodium succinate (SOLU-MEDROL) 125 mg/2 mL injection 125 mg    Sig:    Patient Instructions  Due to your low oxygen - hospital evaluation needed, possible admission. You were given 2 doses of albuterol and atrovent nebs in office, as well as Solumedrol 125mg  IV (steroid).

## 2014-08-13 NOTE — ED Notes (Signed)
Brought in by EMS from a doctor's office with c/o shortness of breath.  Pt reported that she has been having shortness of breath today--- went to her PCP for further evaluation but condition got so severe that she needed to call EMS.  Pt was given Albuterol 12.5 mg and Atrovent 1 mg per nebt tx, and Solu-Medrol 125 mg IV en route to ED.  Pt presents to ED A/Ox4, speaks in completed sentences, with neb tx ongoing.

## 2014-08-13 NOTE — ED Notes (Signed)
Bed: RESB Expected date:  Expected time:  Means of arrival:  Comments: EMS SOB 

## 2014-08-14 DIAGNOSIS — Z87891 Personal history of nicotine dependence: Secondary | ICD-10-CM | POA: Diagnosis present

## 2014-08-14 DIAGNOSIS — F1910 Other psychoactive substance abuse, uncomplicated: Secondary | ICD-10-CM

## 2014-08-14 DIAGNOSIS — J209 Acute bronchitis, unspecified: Secondary | ICD-10-CM | POA: Diagnosis present

## 2014-08-14 DIAGNOSIS — D509 Iron deficiency anemia, unspecified: Secondary | ICD-10-CM | POA: Diagnosis present

## 2014-08-14 LAB — COMPREHENSIVE METABOLIC PANEL
ALBUMIN: 3.3 g/dL — AB (ref 3.5–5.2)
ALK PHOS: 102 U/L (ref 39–117)
ALT: 10 U/L (ref 0–35)
AST: 10 U/L (ref 0–37)
Anion gap: 11 (ref 5–15)
BILIRUBIN TOTAL: 0.3 mg/dL (ref 0.3–1.2)
BUN: 11 mg/dL (ref 6–23)
CHLORIDE: 99 meq/L (ref 96–112)
CO2: 25 mEq/L (ref 19–32)
Calcium: 9.2 mg/dL (ref 8.4–10.5)
Creatinine, Ser: 0.57 mg/dL (ref 0.50–1.10)
GFR calc Af Amer: >90 mL/min (ref >90)
GFR calc non Af Amer: >90 mL/min (ref >90)
Glucose, Bld: 159 mg/dL — ABNORMAL HIGH (ref 70–99)
POTASSIUM: 4.3 meq/L (ref 3.7–5.3)
SODIUM: 135 meq/L — AB (ref 137–147)
TOTAL PROTEIN: 7.8 g/dL (ref 6.0–8.3)

## 2014-08-14 LAB — HEMOGLOBIN A1C
Hgb A1c MFr Bld: 5 % (ref <5.7)
Mean Plasma Glucose: 97 mg/dL (ref <117)

## 2014-08-14 LAB — CBC
HEMATOCRIT: 35.6 % — AB (ref 36.0–46.0)
Hemoglobin: 10.4 g/dL — ABNORMAL LOW (ref 12.0–15.0)
MCH: 21.5 pg — AB (ref 26.0–34.0)
MCHC: 29.2 g/dL — AB (ref 30.0–36.0)
MCV: 73.6 fL — AB (ref 78.0–100.0)
Platelets: 213 10*3/uL (ref 150–400)
RBC: 4.84 MIL/uL (ref 3.87–5.11)
RDW: 18.3 % — AB (ref 11.5–15.5)
WBC: 9.8 10*3/uL (ref 4.0–10.5)

## 2014-08-14 LAB — GLUCOSE, CAPILLARY: GLUCOSE-CAPILLARY: 148 mg/dL — AB (ref 70–99)

## 2014-08-14 LAB — MRSA PCR SCREENING: MRSA by PCR: NEGATIVE

## 2014-08-14 LAB — TSH: TSH: 0.42 u[IU]/mL (ref 0.350–4.500)

## 2014-08-14 MED ORDER — AZITHROMYCIN 250 MG PO TABS: ORAL_TABLET | ORAL | Status: DC

## 2014-08-14 MED ORDER — IPRATROPIUM-ALBUTEROL 0.5-2.5 (3) MG/3ML IN SOLN: 3.0000 mL | Freq: Three times a day (TID) | RESPIRATORY_TRACT | Status: DC

## 2014-08-14 MED ORDER — PREDNISONE 10 MG PO TABS: ORAL_TABLET | ORAL | Status: DC

## 2014-08-14 MED ORDER — IPRATROPIUM-ALBUTEROL 0.5-2.5 (3) MG/3ML IN SOLN
3.0000 mL | RESPIRATORY_TRACT | Status: DC | PRN
  Administered 2014-08-14: 3 mL via RESPIRATORY_TRACT

## 2014-08-14 MED ORDER — PREDNISONE 20 MG PO TABS: 50.0000 mg | ORAL_TABLET | Freq: Two times a day (BID) | ORAL | Status: DC

## 2014-08-14 NOTE — Discharge Summary (Signed)
Discharge Summary  April Ellis:[id number] DOB: 05/11/1976  PCP: April Reason, MD  Admit date: 08/13/2014 Discharge date: 08/14/2014  Time spent: 25 minutes  Recommendations for Outpatient Follow-up:  1. New medication: Prednisone taper 2. Patient will follow up with her pulmonologist Dr. Lynford Ellis in the next 2 weeks  3. New medication: Z-Pak-uses directed  Discharge Diagnoses:  Active Hospital Problems   Diagnosis Date Noted  . Moderate persistent asthma with acute exacerbation in adult 08/13/2014  . Iron deficiency anemia 08/14/2014  . OSA (obstructive sleep apnea) 05/03/2014  . Morbid obesity 12/19/2008    Resolved Hospital Problems   Diagnosis Date Noted Date Resolved  No resolved problems to display.    Discharge Condition: Improved, being discharged home  Diet recommendation: Regular  Filed Weights   08/14/14 0842  Weight: 171.3 kg (377 lb 10.4 oz)    History of present illness:  38 year old female past mental history of morbid obesity, tobacco abuse and asthma admitted to hospitalist service on 11/17 for increased shortness of breath 1 day long with coughing and wheezing. Patient continued to smoke. Seen in urgent care, but with no improvement following nebulizer treatment, patient still noted to be hypoxic so she was brought into emergency room. ABG done noting minimal PCO2 elevation. Patient started on supplement oxygen plus nebulizers.   Hospital Course:  Principal Problem:   Moderate persistent asthma with acute exacerbation in adult with secondary bronchitis: Patient started on IV antibiotics plus steroids plus nebulizers plus oxygen. By hospital day 2, breathing much more comfortably on room air with only minimal end expiratory wheezing. Ambulated in the hallway and able to maintain oxygen saturations on room air 95%. Discharged on prednisone taper, Z-Pak and advised to quit smoking. Active Problems:   Morbid obesity: Patient meets criteria with BMI  greater than 40   OSA (obstructive sleep apnea): Patient states she's not had a formal sleep study, but is resistant to April Ellis. Advised her to follow-up with April Ellis to get formal test and have all the information   Iron deficiency anemia: Likely from menses. Based on previous hemoglobins, stable  tobacco abuse: Patient counseled to quit  Procedures:  None  Consultations:  None  Discharge Exam: BP 139/60 mmHg  Pulse 72  Temp(Src) 97.6 F (36.4 C) (Oral)  Resp 13  Ht 5\' 2"  (1.575 m)  Wt 171.3 kg (377 lb 10.4 oz)  BMI 69.06 kg/m2  SpO2 100%  LMP 07/21/2014  General: Alert and oriented 3, no acute distress Cardiovascular: Regular rate and rhythm, S1-S2 Respiratory: Minimal end expiratory wheeze  Discharge Instructions You were cared for by a hospitalist during your hospital stay. If you have any questions about your discharge medications or the care you received while you were in the hospital after you are discharged, you can call the unit and asked to speak with the hospitalist on call if the hospitalist that took care of you is not available. Once you are discharged, your primary care physician will handle any further medical issues. Please note that NO REFILLS for any discharge medications will be authorized once you are discharged, as it is imperative that you return to your primary care physician (or establish a relationship with a primary care physician if you do not have one) for your aftercare needs so that they can reassess your need for medications and monitor your lab values.  Discharge Instructions    Diet - low sodium heart healthy    Complete by:  As directed  Increase activity slowly    Complete by:  As directed             Medication List    TAKE these medications        albuterol (2.5 MG/3ML) 0.083% nebulizer solution  Commonly known as:  PROVENTIL  Take 3 mLs (2.5 mg total) by nebulization every 6 (six) hours as needed for wheezing or shortness of  breath. Use 3 times daily for 4 days then use as needed.     albuterol 108 (90 BASE) MCG/ACT inhaler  Commonly known as:  PROVENTIL HFA;VENTOLIN HFA  Inhale 2 puffs into the lungs every 6 (six) hours as needed for wheezing or shortness of breath.     azithromycin 250 MG tablet  Commonly known as:  ZITHROMAX  Take 2 tabs (500mg ) on day 1, then starting on day 2, take 1 tab once a day for the next 5 days     guaiFENesin 600 MG 12 hr tablet  Commonly known as:  MUCINEX  Take 2 tablets (1,200 mg total) by mouth 2 (two) times daily. Use for 5 days then stop     mometasone-formoterol 200-5 MCG/ACT Aero  Commonly known as:  DULERA  Inhale 2 puffs into the lungs 2 (two) times daily.     predniSONE 10 MG tablet  Commonly known as:  DELTASONE  50 mg po on day 1, then 40mg  on day 2, then 30mg  on day 3, then 20 mg on day 4, then 10mg  on day 5, then stop.       Allergies  Allergen Reactions  . Eggs Or Egg-Derived Products Nausea And Vomiting       Follow-up Information    Follow up with HOPPER,DAVID, MD In 1 month.   Specialty:  Family Medicine   Why:  As needed   Contact information:   [place] [address] [phone]       Follow up with [facility], MD. Schedule an appointment as soon as possible for a visit in 2 weeks.   Specialty:  Pulmonary Disease   Contact information:   [place] [address] [phone]        The results of significant diagnostics from this hospitalization (including imaging, microbiology, ancillary and laboratory) are listed below for reference.    Significant Diagnostic Studies: Dg Chest 2 View  08/13/2014   CLINICAL DATA:  Wheezing  EXAM: CHEST  2 VIEW  COMPARISON:  Portable chest x-ray of June 27, 2014  FINDINGS: The lungs are mildly hyperinflated. There is no focal infiltrate. The cardiac silhouette is top-normal in size but stable. The central pulmonary vascularity is prominent but less conspicuous than  on the previous study. The trachea is midline. There is no pleural effusion or pneumothorax. The bony thorax is unremarkable.  IMPRESSION: There is mild hyperinflation which may reflect reactive airway disease and acute bronchitis. There is no focal pneumonia.   Electronically Signed   By: David  Martinique   On: 08/13/2014 15:34    Microbiology: Recent Results (from the past 240 hour(s))  MRSA PCR Screening     Status: None   Collection Time: 08/13/14 11:47 PM  Result Value Ref Range Status   MRSA by PCR NEGATIVE NEGATIVE Final    Comment:        The GeneXpert MRSA Assay (FDA approved for NASAL specimens only), is one component of a comprehensive MRSA colonization surveillance program. It is not intended to diagnose MRSA infection nor to guide  or monitor treatment for MRSA infections.      Labs: Basic Metabolic Panel:  Recent Labs Lab 08/13/14 1947 08/14/14 0352  NA 134* 135*  K 4.1 4.3  CL 98 99  CO2 23 25  GLUCOSE 116* 159*  BUN 9 11  CREATININE 0.58 0.57  CALCIUM 9.3 9.2   Liver Function Tests:  Recent Labs Lab 08/14/14 0352  AST 10  ALT 10  ALKPHOS 102  BILITOT 0.3  PROT 7.8  ALBUMIN 3.3*   No results for input(s): LIPASE, AMYLASE in the last 168 hours. No results for input(s): AMMONIA in the last 168 hours. CBC:  Recent Labs Lab 08/13/14 1947 08/14/14 0352  WBC 11.9* 9.8  NEUTROABS 11.2*  --   HGB 10.4* 10.4*  HCT 35.9* 35.6*  MCV 73.0* 73.6*  PLT 231 213   Cardiac Enzymes: No results for input(s): CKTOTAL, CKMB, CKMBINDEX, TROPONINI in the last 168 hours. BNP: BNP (last 3 results) No results for input(s): PROBNP in the last 8760 hours. CBG:  Recent Labs Lab 08/14/14 0803  GLUCAP 148*       Signed:  Annita Brod  [facility] 08/14/2014, 6:21 PM

## 2014-08-14 NOTE — ED Provider Notes (Signed)
CSN: [id number]     Arrival date & time 08/13/14  1646 History   First MD Initiated Contact with Patient 08/13/14 1818     Chief Complaint  Patient presents with  . Shortness of Breath     (Consider location/radiation/quality/duration/timing/severity/associated sxs/prior Treatment) Patient is a 38 y.o. female presenting with shortness of breath. The history is provided by the patient. No language interpreter was used.  Shortness of Breath Severity:  Severe Onset quality:  Gradual Duration:  1 day Timing:  Constant Progression:  Improving Chronicity:  Recurrent Relieved by:  Nothing Worsened by:  Exertion and movement Ineffective treatments:  Inhaler Associated symptoms: no abdominal pain, no chest pain, no claudication, no cough, no diaphoresis, no fever, no headaches, no neck pain, no sore throat and no vomiting   Associated symptoms comment:  Sneezing, rhinorrhea Risk factors: obesity     Past Medical History  Diagnosis Date  . Anemia   . Bronchitis   . Asthma   . Obesity    Past Surgical History  Procedure Laterality Date  . No past surgeries     Family History  Problem Relation Age of Onset  . Diabetes Mother   . Diabetes Sister   . Hypertension Sister   . Hypertension Sister    History  Substance Use Topics  . Smoking status: Former Smoker -- 0.25 packs/day for 15 years    Types: Cigarettes    Quit date: 07/23/2014  . Smokeless tobacco: Never Used  . Alcohol Use: No   OB History    No data available     Review of Systems  Constitutional: Negative for fever, chills, diaphoresis, activity change, appetite change and fatigue.  HENT: Negative for congestion, facial swelling, rhinorrhea and sore throat.   Eyes: Negative for photophobia and discharge.  Respiratory: Positive for shortness of breath. Negative for cough and chest tightness.   Cardiovascular: Negative for chest pain, palpitations, claudication and leg swelling.  Gastrointestinal: Negative for  nausea, vomiting, abdominal pain and diarrhea.  Endocrine: Negative for polydipsia and polyuria.  Genitourinary: Negative for dysuria, frequency, difficulty urinating and pelvic pain.  Musculoskeletal: Negative for back pain, arthralgias, neck pain and neck stiffness.  Skin: Negative for color change and wound.  Allergic/Immunologic: Negative for immunocompromised state.  Neurological: Negative for facial asymmetry, weakness, numbness and headaches.  Hematological: Does not bruise/bleed easily.  Psychiatric/Behavioral: Negative for confusion and agitation.      Allergies  Eggs or egg-derived products  Home Medications   Prior to Admission medications   Medication Sig Start Date End Date Taking? Authorizing Provider  albuterol (PROVENTIL HFA;VENTOLIN HFA) 108 (90 BASE) MCG/ACT inhaler Inhale 2 puffs into the lungs every 6 (six) hours as needed for wheezing or shortness of breath. 07/25/14  Yes Posey Boyer, MD  albuterol (PROVENTIL) (2.5 MG/3ML) 0.083% nebulizer solution Take 3 mLs (2.5 mg total) by nebulization every 6 (six) hours as needed for wheezing or shortness of breath. Use 3 times daily for 4 days then use as needed. Patient taking differently: Take 2.5 mg by nebulization every 6 (six) hours as needed for wheezing or shortness of breath.  07/25/14  Yes Posey Boyer, MD  guaiFENesin (MUCINEX) 600 MG 12 hr tablet Take 2 tablets (1,200 mg total) by mouth 2 (two) times daily. Use for 5 days then stop Patient taking differently: Take 1,200 mg by mouth 2 (two) times daily.  05/14/14  Yes Eugenie Filler, MD  mometasone-formoterol (DULERA) 200-5 MCG/ACT AERO Inhale 2 puffs into  the lungs 2 (two) times daily. 06/28/14  Yes Robbie Lis, MD  azithromycin (ZITHROMAX) 250 MG tablet Take 2 tabs (500mg ) on day 1, then starting on day 2, take 1 tab once a day for the next 5 days 08/14/14   Annita Brod, MD  predniSONE (DELTASONE) 10 MG tablet 50 mg po on day 1, then 40mg  on day 2, then  30mg  on day 3, then 20 mg on day 4, then 10mg  on day 5, then stop. 08/14/14   Annita Brod, MD   BP 139/60 mmHg  Pulse 72  Temp(Src) 97.6 F (36.4 C) (Oral)  Resp 13  Ht 5\' 2"  (1.575 m)  Wt 377 lb 10.4 oz (171.3 kg)  BMI 69.06 kg/m2  SpO2 100%  LMP 07/21/2014 Physical Exam  Constitutional: She is oriented to person, place, and time. She appears well-developed and well-nourished. No distress.  HENT:  Head: Normocephalic and atraumatic.  Mouth/Throat: No oropharyngeal exudate.  Eyes: Pupils are equal, round, and reactive to light.  Neck: Normal range of motion. Neck supple.  Cardiovascular: Normal rate, regular rhythm and normal heart sounds.  Exam reveals no gallop and no friction rub.   No murmur heard. Pulmonary/Chest: Effort normal. No respiratory distress. She has decreased breath sounds in the right upper field, the right middle field, the right lower field, the left upper field, the left middle field and the left lower field. She has wheezes in the right upper field, the right middle field, the right lower field, the left upper field, the left middle field and the left lower field. She has no rales.  Abdominal: Soft. Bowel sounds are normal. She exhibits no distension and no mass. There is no tenderness. There is no rebound and no guarding.  Musculoskeletal: Normal range of motion. She exhibits no edema or tenderness.  Neurological: She is alert and oriented to person, place, and time.  Skin: Skin is warm and dry.  Psychiatric: She has a normal mood and affect.    ED Course  Procedures (including critical care time) Labs Review Labs Reviewed  CBC WITH DIFFERENTIAL - Abnormal; Notable for the following:    WBC 11.9 (*)    Hemoglobin 10.4 (*)    HCT 35.9 (*)    MCV 73.0 (*)    MCH 21.1 (*)    MCHC 29.0 (*)    RDW 18.3 (*)    Neutrophils Relative % 94 (*)    Lymphocytes Relative 4 (*)    Monocytes Relative 2 (*)    Neutro Abs 11.2 (*)    Lymphs Abs 0.5 (*)    All  other components within normal limits  BASIC METABOLIC PANEL - Abnormal; Notable for the following:    Sodium 134 (*)    Glucose, Bld 116 (*)    All other components within normal limits  BLOOD GAS, ARTERIAL - Abnormal; Notable for the following:    pCO2 arterial 47.5 (*)    pO2, Arterial 60.0 (*)    Bicarbonate 26.0 (*)    All other components within normal limits  COMPREHENSIVE METABOLIC PANEL - Abnormal; Notable for the following:    Sodium 135 (*)    Glucose, Bld 159 (*)    Albumin 3.3 (*)    All other components within normal limits  CBC - Abnormal; Notable for the following:    Hemoglobin 10.4 (*)    HCT 35.6 (*)    MCV 73.6 (*)    MCH 21.5 (*)    MCHC  29.2 (*)    RDW 18.3 (*)    All other components within normal limits  MRSA PCR SCREENING  TSH  HEMOGLOBIN A1C    Imaging Review Dg Chest 2 View  08/13/2014   CLINICAL DATA:  Wheezing  EXAM: CHEST  2 VIEW  COMPARISON:  Portable chest x-ray of June 27, 2014  FINDINGS: The lungs are mildly hyperinflated. There is no focal infiltrate. The cardiac silhouette is top-normal in size but stable. The central pulmonary vascularity is prominent but less conspicuous than on the previous study. The trachea is midline. There is no pleural effusion or pneumothorax. The bony thorax is unremarkable.  IMPRESSION: There is mild hyperinflation which may reflect reactive airway disease and acute bronchitis. There is no focal pneumonia.   Electronically Signed   By: David  Martinique   On: 08/13/2014 15:34     EKG Interpretation None      MDM   Final diagnoses:  Moderate persistent asthma with acute exacerbation in adult    Pt is a 38 y.o. female with Pmhx as above who presents with 1 day of inc SOB. Pt states she began having sneezing, rhinorrhea, yesterday, with inc WOB today. She has had little cough, no fever, chills, chest pain, n/v, d/a, leg pain or swelling. SH ewas sent from UC after she remained hypoxic after 2 duonebs and IV  solumedrol. EMS did a trial of BI=iPAP en route which she did not tolerate, and started a 10mg  continuous neb. Here, she requires 2L by [place] to maintain sats in lower 90's. She has dec air mvmt and wheezing throughout, but states she she feels much better from earlier.  CXR from UC nml. CBC, BMP grossly unremarkable. Another 10mg  neb given here, without significant change in oxygenation. [facility] consulted and will admit.         Ernestina Patches, MD 08/14/14 [phone]

## 2014-08-14 NOTE — Discharge Instructions (Signed)

## 2014-08-14 NOTE — Care Management Note (Signed)
  Page 2 of 2   08/14/2014     10:31:56 AM CARE MANAGEMENT NOTE 08/14/2014  Patient:  April Ellis, April Ellis   Account Number:  [id number]  Date Initiated:  08/14/2014  Documentation initiated by:  DAVIS,RHONDA  Subjective/Objective Assessment:   copd and asthma with failed outpt treatment     Action/Plan:   home when stable/pstient continues to smoke will need information ocncerning smoking cessation.   Anticipated DC Date:  08/17/2014   Anticipated DC Plan:  HOME/SELF CARE  In-house referral  NA      DC Planning Services  CM consult      PAC Choice  NA   Choice offered to / List presented to:  NA   DME arranged  NA      DME agency  NA     [place] arranged  NA      [place] agency  NA   Status of service:  In process, will continue to follow Medicare Important Message given?   (If response is "NO", the following Medicare IM given date fields will be blank) Date Medicare IM given:   Medicare IM given by:   Date Additional Medicare IM given:   Additional Medicare IM given by:    Discharge Disposition:    Per UR Regulation:  Reviewed for med. necessity/level of care/duration of stay  If discussed at [place] of Stay Meetings, dates discussed:    Comments:  [id number]/Rhonda Rosana Hoes, RN, BSN, CCM Chart reviewed. Discharge needs and patient's stay to be reviewed and followed by case manager.-Web site information given to patient concerning [email or web address].com/, for help with free smoking cessation. Chart note for progression of stay:

## 2014-08-14 NOTE — Progress Notes (Signed)
RT placed pt on auto titrate CPAP 5-20cmH2O with 2L of oxygen bled in via ffm. Sterile water was added for humidification. Pt states she is comfortable and is tolerating CPAP well at this time. RT will continue to monitor as needed.

## 2014-08-14 NOTE — Progress Notes (Signed)
Pt maintained O2 sats of 100% during ambulation; tolerated well

## 2014-08-14 NOTE — Progress Notes (Signed)
Pt discharged home via family; Pt and family given and explained all discharge instructions, carenotes, and prescriptions; pt and family stated understanding and denied questions/concerns; all f/u appointments in place; IV removed without complicaitons; pt stable at time of discharge  

## 2014-09-02 ENCOUNTER — Ambulatory Visit (INDEPENDENT_AMBULATORY_CARE_PROVIDER_SITE_OTHER): Payer: No Typology Code available for payment source | Admitting: Family Medicine

## 2014-09-02 VITALS — BP 132/86 | HR 82 | Temp 97.8°F | Resp 16 | Ht 61.0 in | Wt 381.0 lb

## 2014-09-02 DIAGNOSIS — J4551 Severe persistent asthma with (acute) exacerbation: Secondary | ICD-10-CM

## 2014-09-02 DIAGNOSIS — J4541 Moderate persistent asthma with (acute) exacerbation: Secondary | ICD-10-CM

## 2014-09-02 DIAGNOSIS — R0602 Shortness of breath: Secondary | ICD-10-CM

## 2014-09-02 MED ORDER — AZITHROMYCIN 250 MG PO TABS: ORAL_TABLET | ORAL | Status: DC

## 2014-09-02 MED ORDER — MOMETASONE FURO-FORMOTEROL FUM 200-5 MCG/ACT IN AERO: 2.0000 | INHALATION_SPRAY | Freq: Two times a day (BID) | RESPIRATORY_TRACT | Status: DC

## 2014-09-02 MED ORDER — METHYLPREDNISOLONE SODIUM SUCC 125 MG IJ SOLR: 125.0000 mg | Freq: Once | INTRAMUSCULAR | Status: DC

## 2014-09-02 MED ORDER — ALBUTEROL SULFATE (2.5 MG/3ML) 0.083% IN NEBU: 2.5000 mg | INHALATION_SOLUTION | RESPIRATORY_TRACT | Status: DC | PRN

## 2014-09-02 MED ORDER — ALBUTEROL SULFATE HFA 108 (90 BASE) MCG/ACT IN AERS: 2.0000 | INHALATION_SPRAY | RESPIRATORY_TRACT | Status: DC | PRN

## 2014-09-02 MED ORDER — PREDNISONE 20 MG PO TABS: ORAL_TABLET | ORAL | Status: DC

## 2014-09-02 NOTE — Patient Instructions (Signed)
Asthma Attack Prevention Although there is no way to prevent asthma from starting, you can take steps to control the disease and reduce its symptoms. Learn about your asthma and how to control it. Take an active role to control your asthma by working with your health care provider to create and follow an asthma action plan. An asthma action plan guides you in:  Taking your medicines properly.  Avoiding things that set off your asthma or make your asthma worse (asthma triggers).  Tracking your level of asthma control.  Responding to worsening asthma.  Seeking emergency care when needed. To track your asthma, keep records of your symptoms, check your peak flow number using a handheld device that shows how well air moves out of your lungs (peak flow meter), and get regular asthma checkups.  WHAT ARE SOME WAYS TO PREVENT AN ASTHMA ATTACK?  Take medicines as directed by your health care provider.  Keep track of your asthma symptoms and level of control.  With your health care provider, write a detailed plan for taking medicines and managing an asthma attack. Then be sure to follow your action plan. Asthma is an ongoing condition that needs regular monitoring and treatment.  Identify and avoid asthma triggers. Many outdoor allergens and irritants (such as pollen, mold, cold air, and air pollution) can trigger asthma attacks. Find out what your asthma triggers are and take steps to avoid them.  Monitor your breathing. Learn to recognize warning signs of an attack, such as coughing, wheezing, or shortness of breath. Your lung function may decrease before you notice any signs or symptoms, so regularly measure and record your peak airflow with a home peak flow meter.  Identify and treat attacks early. If you act quickly, you are less likely to have a severe attack. You will also need less medicine to control your symptoms. When your peak flow measurements decrease and alert you to an upcoming attack,  take your medicine as instructed and immediately stop any activity that may have triggered the attack. If your symptoms do not improve, get medical help.  Pay attention to increasing quick-relief inhaler use. If you find yourself relying on your quick-relief inhaler, your asthma is not under control. See your health care provider about adjusting your treatment. WHAT CAN MAKE MY SYMPTOMS WORSE? A number of common things can set off or make your asthma symptoms worse and cause temporary increased inflammation of your airways. Keep track of your asthma symptoms for several weeks, detailing all the environmental and emotional factors that are linked with your asthma. When you have an asthma attack, go back to your asthma diary to see which factor, or combination of factors, might have contributed to it. Once you know what these factors are, you can take steps to control many of them. If you have allergies and asthma, it is important to take asthma prevention steps at home. Minimizing contact with the substance to which you are allergic will help prevent an asthma attack. Some triggers and ways to avoid these triggers are: Animal Dander:  Some people are allergic to the flakes of skin or dried saliva from animals with fur or feathers.   There is no such thing as a hypoallergenic dog or cat breed. All dogs or cats can cause allergies, even if they don't shed.  Keep these pets out of your home.  If you are not able to keep a pet outdoors, keep the pet out of your bedroom and other sleeping areas at all   times, and keep the door closed.  Remove carpets and furniture covered with cloth from your home. If that is not possible, keep the pet away from fabric-covered furniture and carpets. Dust Mites: Many people with asthma are allergic to dust mites. Dust mites are tiny bugs that are found in every home in mattresses, pillows, carpets, fabric-covered furniture, bedcovers, clothes, stuffed toys, and other  fabric-covered items.   Cover your mattress in a special dust-proof cover.  Cover your pillow in a special dust-proof cover, or wash the pillow each week in hot water. Water must be hotter than 130 F (54.4 C) to kill dust mites. Cold or warm water used with detergent and bleach can also be effective.  Wash the sheets and blankets on your bed each week in hot water.  Try not to sleep or lie on cloth-covered cushions.  Call ahead when traveling and ask for a smoke-free hotel room. Bring your own bedding and pillows in case the hotel only supplies feather pillows and down comforters, which may contain dust mites and cause asthma symptoms.  Remove carpets from your bedroom and those laid on concrete, if you can.  Keep stuffed toys out of the bed, or wash the toys weekly in hot water or cooler water with detergent and bleach. Cockroaches: Many people with asthma are allergic to the droppings and remains of cockroaches.   Keep food and garbage in closed containers. Never leave food out.  Use poison baits, traps, powders, gels, or paste (for example, boric acid).  If a spray is used to kill cockroaches, stay out of the room until the odor goes away. Indoor Mold:  Fix leaky faucets, pipes, or other sources of water that have mold around them.  Clean floors and moldy surfaces with a fungicide or diluted bleach.  Avoid using humidifiers, vaporizers, or swamp coolers. These can spread molds through the air. Pollen and Outdoor Mold:  When pollen or mold spore counts are high, try to keep your windows closed.  Stay indoors with windows closed from late morning to afternoon. Pollen and some mold spore counts are highest at that time.  Ask your health care provider whether you need to take anti-inflammatory medicine or increase your dose of the medicine before your allergy season starts. Other Irritants to Avoid:  Tobacco smoke is an irritant. If you smoke, ask your health care provider how  you can quit. Ask family members to quit smoking, too. Do not allow smoking in your home or car.  If possible, do not use a wood-burning stove, kerosene heater, or fireplace. Minimize exposure to all sources of smoke, including incense, candles, fires, and fireworks.  Try to stay away from strong odors and sprays, such as perfume, talcum powder, hair spray, and paints.  Decrease humidity in your home and use an indoor air cleaning device. Reduce indoor humidity to below 60%. Dehumidifiers or central air conditioners can do this.  Decrease house dust exposure by changing furnace and air cooler filters frequently.  Try to have someone else vacuum for you once or twice a week. Stay out of rooms while they are being vacuumed and for a short while afterward.  If you vacuum, use a dust mask from a hardware store, a double-layered or microfilter vacuum cleaner bag, or a vacuum cleaner with a HEPA filter.  Sulfites in foods and beverages can be irritants. Do not drink beer or wine or eat dried fruit, processed potatoes, or shrimp if they cause asthma symptoms.  Cold   air can trigger an asthma attack. Cover your nose and mouth with a scarf on cold or windy days. °· Several health conditions can make asthma more difficult to manage, including a runny nose, sinus infections, reflux disease, psychological stress, and sleep apnea. Work with your health care provider to manage these conditions. °· Avoid close contact with people who have a respiratory infection such as a cold or the flu, since your asthma symptoms may get worse if you catch the infection. Wash your hands thoroughly after touching items that may have been handled by people with a respiratory infection. °· Get a flu shot every year to protect against the flu virus, which often makes asthma worse for days or weeks. Also get a pneumonia shot if you have not previously had one. Unlike the flu shot, the pneumonia shot does not need to be given  yearly. °Medicines: °· Talk to your health care provider about whether it is safe for you to take aspirin or non-steroidal anti-inflammatory medicines (NSAIDs). In a small number of people with asthma, aspirin and NSAIDs can cause asthma attacks. These medicines must be avoided by people who have known aspirin-sensitive asthma. It is important that people with aspirin-sensitive asthma read labels of all over-the-counter medicines used to treat pain, colds, coughs, and fever. °· Beta-blockers and ACE inhibitors are other medicines you should discuss with your health care provider. °HOW CAN I FIND OUT WHAT I AM ALLERGIC TO? °Ask your asthma health care provider about allergy skin testing or blood testing (the RAST test) to identify the allergens to which you are sensitive. If you are found to have allergies, the most important thing to do is to try to avoid exposure to any allergens that you are sensitive to as much as possible. Other treatments for allergies, such as medicines and allergy shots (immunotherapy) are available.  °CAN I EXERCISE? °Follow your health care provider's advice regarding asthma treatment before exercising. It is important to maintain a regular exercise program, but vigorous exercise or exercise in cold, humid, or dry environments can cause asthma attacks, especially for those people who have exercise-induced asthma. °Document Released: 09/01/2009 Document Revised: 09/18/2013 Document Reviewed: 03/21/2013 °[employer]® Patient Information ©2015 [employer]. This information is not intended to replace advice given to you by your health care provider. Make sure you discuss any questions you have with your health care provider. ° °Asthma, Acute Bronchospasm °Acute bronchospasm caused by asthma is also referred to as an asthma attack. Bronchospasm means your air passages become narrowed. The narrowing is caused by inflammation and tightening of the muscles in the air tubes (bronchi) in your lungs.  This can make it hard to breathe or cause you to wheeze and cough. °CAUSES °Possible triggers are: °· Animal dander from the skin, hair, or feathers of animals. °· Dust mites contained in house dust. °· Cockroaches. °· Pollen from trees or grass. °· Mold. °· Cigarette or tobacco smoke. °· Air pollutants such as dust, household cleaners, hair sprays, aerosol sprays, paint fumes, strong chemicals, or strong odors. °· Cold air or weather changes. Cold air may trigger inflammation. Winds increase molds and pollens in the air. °· Strong emotions such as crying or laughing hard. °· Stress. °· Certain medicines such as aspirin or beta-blockers. °· Sulfites in foods and drinks, such as dried fruits and wine. °· Infections or inflammatory conditions, such as a flu, cold, or inflammation of the nasal membranes (rhinitis). °· Gastroesophageal reflux disease (GERD). GERD is a condition where stomach   acid backs up into your esophagus. °· Exercise or strenuous activity. °SIGNS AND SYMPTOMS  °· Wheezing. °· Excessive coughing, particularly at night. °· Chest tightness. °· Shortness of breath. °DIAGNOSIS  °Your health care provider will ask you about your medical history and perform a physical exam. A chest X-ray or blood testing may be performed to look for other causes of your symptoms or other conditions that may have triggered your asthma attack.  °TREATMENT  °Treatment is aimed at reducing inflammation and opening up the airways in your lungs.  Most asthma attacks are treated with inhaled medicines. These include quick relief or rescue medicines (such as bronchodilators) and controller medicines (such as inhaled corticosteroids). These medicines are sometimes given through an inhaler or a nebulizer. Systemic steroid medicine taken by mouth or given through an IV tube also can be used to reduce the inflammation when an attack is moderate or severe. Antibiotic medicines are only used if a bacterial infection is present.  °HOME  CARE INSTRUCTIONS  °· Rest. °· Drink plenty of liquids. This helps the mucus to remain thin and be easily coughed up. Only use caffeine in moderation and do not use alcohol until you have recovered from your illness. °· Do not smoke. Avoid being exposed to secondhand smoke. °· You play a critical role in keeping yourself in good health. Avoid exposure to things that cause you to wheeze or to have breathing problems. °· Keep your medicines up-to-date and available. Carefully follow your health care provider's treatment plan. °· Take your medicine exactly as prescribed. °· When pollen or pollution is bad, keep windows closed and use an air conditioner or go to places with air conditioning. °· Asthma requires careful medical care. See your health care provider for a follow-up as advised. If you are more than [redacted] weeks pregnant and you were prescribed any new medicines, let your obstetrician know about the visit and how you are doing. Follow up with your health care provider as directed. °· After you have recovered from your asthma attack, make an appointment with your outpatient doctor to talk about ways to reduce the likelihood of future attacks. If you do not have a doctor who manages your asthma, make an appointment with a primary care doctor to discuss your asthma. °SEEK IMMEDIATE MEDICAL CARE IF:  °· You are getting worse. °· You have trouble breathing. If severe, call your local emergency services (911 in the [place] °· You develop chest pain or discomfort. °· You are vomiting. °· You are not able to keep fluids down. °· You are coughing up yellow, green, brown, or bloody sputum. °· You have a fever and your symptoms suddenly get worse. °· You have trouble swallowing. °MAKE SURE YOU:  °· Understand these instructions. °· Will watch your condition. °· Will get help right away if you are not doing well or get worse. °Document Released: 12/29/2006 Document Revised: 09/18/2013 Document Reviewed: 03/21/2013 °[employer]®  Patient Information ©2015 [employer]. This information is not intended to replace advice given to you by your health care provider. Make sure you discuss any questions you have with your health care provider. ° °

## 2014-09-02 NOTE — Progress Notes (Signed)
Subjective:  This chart was scribed for Delman Cheadle, MD by Randa Evens, ED Scribe. This Patient was seen in room 01 and the patients care was started at 8:25 PM   Patient ID: April Ellis, female    DOB: [birth year], 38 y.o.   MRN: [id number] Chief Complaint  Patient presents with  . Cough    Cough Associated symptoms include shortness of breath and wheezing. Pertinent negatives include no chills or fever.   HPI Comments: April Ellis is a 38 y.o. female with PMHx of asthma and bronchitis  who presents to the [facility] complaining of clear productive cough onset 1 week prior. She states she has associated SOB and wheezing. She states she also has some nasal congestion as well. Pt states she recently had cold symptoms around 11/26. She states that her symptoms are not really improving. She states she does take Mucinex that provides some relief. She states she has also been taking her albuterol inhaler as well. She states she also tried one dose of prednisone that provided enough relief for her to receive an at home breathing treatment. She states that when her asthma is not flaring up she doesn't use her inhaler. She states that over the past week she has been using her inhaler about twice a day. She states that she also has been using her nebulizer twice a day over the past week. Pt states she is compliant with taking her Dulera inhaler twice per day. Pt states she was recently around nephew who had similar cold symptoms. Denies fever or chills. Pt stats that she is starting to recognize signs when her asthma is going to flare up such as when waking up out of her sleep coughing.     Past Medical History  Diagnosis Date  . Anemia   . Bronchitis   . Asthma   . Obesity    Current Outpatient Prescriptions on File Prior to Visit  Medication Sig Dispense Refill  . albuterol (PROVENTIL HFA;VENTOLIN HFA) 108 (90 BASE) MCG/ACT inhaler Inhale 2 puffs into the lungs every 6  (six) hours as needed for wheezing or shortness of breath. 1 Inhaler 4  . albuterol (PROVENTIL) (2.5 MG/3ML) 0.083% nebulizer solution Take 3 mLs (2.5 mg total) by nebulization every 6 (six) hours as needed for wheezing or shortness of breath. Use 3 times daily for 4 days then use as needed. (Patient taking differently: Take 2.5 mg by nebulization every 6 (six) hours as needed for wheezing or shortness of breath. ) 150 mL 2  . guaiFENesin (MUCINEX) 600 MG 12 hr tablet Take 2 tablets (1,200 mg total) by mouth 2 (two) times daily. Use for 5 days then stop (Patient taking differently: Take 1,200 mg by mouth 2 (two) times daily. ) 20 tablet 0  . mometasone-formoterol (DULERA) 200-5 MCG/ACT AERO Inhale 2 puffs into the lungs 2 (two) times daily. 1 Inhaler 1   No current facility-administered medications on file prior to visit.   Allergies  Allergen Reactions  . Eggs Or Egg-Derived Products Nausea And Vomiting    Review of Systems  Constitutional: Negative for fever and chills.  HENT: Positive for congestion.   Respiratory: Positive for cough, shortness of breath and wheezing.     Objective:   BP 132/86 mmHg  Pulse 82  Temp(Src) 97.8 F (36.6 C) (Oral)  Resp 16  Ht 5\' 1"  (1.549 m)  Wt 381 lb (172.82 kg)  BMI 72.03 kg/m2  SpO2 95%  LMP 08/29/2014   Physical Exam  Constitutional: She is oriented to person, place, and time. She appears well-developed and well-nourished. No distress.  HENT:  Head: Normocephalic and atraumatic.  Right Ear: Tympanic membrane normal.  Left Ear: Tympanic membrane normal.  Mouth/Throat: Posterior oropharyngeal erythema present.  Pale boggy mucosa in nares   Eyes: Conjunctivae and EOM are normal.  Neck: Neck supple. No tracheal deviation present.  Cardiovascular: Normal rate.   Pulmonary/Chest: Effort normal. No respiratory distress. She has decreased breath sounds. She has wheezes. She has rhonchi.  decreased air movement expiratory phase, expiratory  wheezing though all lung fields, expiratory rhonchi  Musculoskeletal: Normal range of motion.  Neurological: She is alert and oriented to person, place, and time.  Skin: Skin is warm and dry.  Psychiatric: She has a normal mood and affect. Her behavior is normal.  Nursing note and vitals reviewed.     Assessment & Plan:   Asthma with acute exacerbation, severe persistent - Plan: DISCONTINUED: methylPREDNISolone sodium succinate (SOLU-MEDROL) 125 mg/2 mL injection 125 mg  Asthma with acute exacerbation, moderate persistent - Plan: albuterol (PROVENTIL) (2.5 MG/3ML) 0.083% nebulizer solution, albuterol (PROVENTIL HFA;VENTOLIN HFA) 108 (90 BASE) MCG/ACT inhaler, DISCONTINUED: methylPREDNISolone sodium succinate (SOLU-MEDROL) 125 mg/2 mL injection 125 mg  SOB (shortness of breath) - Plan: albuterol (PROVENTIL HFA;VENTOLIN HFA) 108 (90 BASE) MCG/ACT inhaler, DISCONTINUED: methylPREDNISolone sodium succinate (SOLU-MEDROL) 125 mg/2 mL injection 125 mg  Meds ordered this encounter  Medications  . DISCONTD: mometasone-formoterol (DULERA) 200-5 MCG/ACT AERO    Sig: Inhale 2 puffs into the lungs 2 (two) times daily.    Dispense:  1 Inhaler    Refill:  1  . albuterol (PROVENTIL) (2.5 MG/3ML) 0.083% nebulizer solution    Sig: Take 3 mLs (2.5 mg total) by nebulization every 4 (four) hours as needed for wheezing or shortness of breath.    Dispense:  150 mL    Refill:  2  . albuterol (PROVENTIL HFA;VENTOLIN HFA) 108 (90 BASE) MCG/ACT inhaler    Sig: Inhale 2 puffs into the lungs every 4 (four) hours as needed for wheezing or shortness of breath.    Dispense:  1 Inhaler    Refill:  4  . azithromycin (ZITHROMAX) 250 MG tablet    Sig: Take 2 tabs PO x 1 dose, then 1 tab PO QD x 4 days    Dispense:  6 tablet    Refill:  0  . predniSONE (DELTASONE) 20 MG tablet    Sig: 3 tabs x 2d, 2 tabs x 2d, 1 tab x 2d, 1/2 tab x 4d, then stop    Dispense:  14 tablet    Refill:  0  . methylPREDNISolone sodium  succinate (SOLU-MEDROL) 125 mg/2 mL injection 125 mg    Sig:     I personally performed the services described in this documentation, which was scribed in my presence. The recorded information has been reviewed and considered, and addended by me as needed.  Delman Cheadle, MD MPH

## 2014-09-30 ENCOUNTER — Emergency Department (HOSPITAL_COMMUNITY): Payer: PRIVATE HEALTH INSURANCE

## 2014-09-30 ENCOUNTER — Encounter (HOSPITAL_COMMUNITY): Payer: Self-pay | Admitting: Emergency Medicine

## 2014-09-30 ENCOUNTER — Inpatient Hospital Stay (HOSPITAL_COMMUNITY)
Admission: EM | Admit: 2014-09-30 | Discharge: 2014-10-02 | DRG: 202 | Disposition: A | Discharge status: Home / Self Care | Payer: No Typology Code available for payment source | Attending: Internal Medicine | Admitting: Internal Medicine

## 2014-09-30 ENCOUNTER — Other Ambulatory Visit: Payer: Self-pay

## 2014-09-30 DIAGNOSIS — T7840XA Allergy, unspecified, initial encounter: Secondary | ICD-10-CM

## 2014-09-30 DIAGNOSIS — J45901 Unspecified asthma with (acute) exacerbation: Principal | ICD-10-CM | POA: Diagnosis present

## 2014-09-30 DIAGNOSIS — I471 Supraventricular tachycardia: Secondary | ICD-10-CM

## 2014-09-30 DIAGNOSIS — R0902 Hypoxemia: Secondary | ICD-10-CM | POA: Diagnosis present

## 2014-09-30 DIAGNOSIS — Z87891 Personal history of nicotine dependence: Secondary | ICD-10-CM

## 2014-09-30 DIAGNOSIS — Z6841 Body Mass Index (BMI) 40.0 and over, adult: Secondary | ICD-10-CM

## 2014-09-30 DIAGNOSIS — R05 Cough: Secondary | ICD-10-CM

## 2014-09-30 DIAGNOSIS — D72829 Elevated white blood cell count, unspecified: Secondary | ICD-10-CM

## 2014-09-30 DIAGNOSIS — J4551 Severe persistent asthma with (acute) exacerbation: Secondary | ICD-10-CM

## 2014-09-30 DIAGNOSIS — G4733 Obstructive sleep apnea (adult) (pediatric): Secondary | ICD-10-CM | POA: Diagnosis present

## 2014-09-30 DIAGNOSIS — R0602 Shortness of breath: Secondary | ICD-10-CM

## 2014-09-30 DIAGNOSIS — L509 Urticaria, unspecified: Secondary | ICD-10-CM | POA: Insufficient documentation

## 2014-09-30 DIAGNOSIS — R000 Tachycardia, unspecified: Secondary | ICD-10-CM

## 2014-09-30 DIAGNOSIS — J449 Chronic obstructive pulmonary disease, unspecified: Secondary | ICD-10-CM | POA: Diagnosis present

## 2014-09-30 DIAGNOSIS — D649 Anemia, unspecified: Secondary | ICD-10-CM | POA: Diagnosis present

## 2014-09-30 DIAGNOSIS — R059 Cough, unspecified: Secondary | ICD-10-CM

## 2014-09-30 DIAGNOSIS — J069 Acute upper respiratory infection, unspecified: Secondary | ICD-10-CM

## 2014-09-30 DIAGNOSIS — J4489 Other specified chronic obstructive pulmonary disease: Secondary | ICD-10-CM | POA: Diagnosis present

## 2014-09-30 DIAGNOSIS — J4541 Moderate persistent asthma with (acute) exacerbation: Secondary | ICD-10-CM

## 2014-09-30 DIAGNOSIS — R062 Wheezing: Secondary | ICD-10-CM

## 2014-09-30 HISTORY — DX: Chronic obstructive pulmonary disease, unspecified: J44.9

## 2014-09-30 LAB — CBC
HCT: 37.5 % (ref 36.0–46.0)
Hemoglobin: 10.5 g/dL — ABNORMAL LOW (ref 12.0–15.0)
MCH: 21.7 pg — ABNORMAL LOW (ref 26.0–34.0)
MCHC: 28 g/dL — ABNORMAL LOW (ref 30.0–36.0)
MCV: 77.6 fL — ABNORMAL LOW (ref 78.0–100.0)
Platelets: 283 10*3/uL (ref 150–400)
RBC: 4.83 MIL/uL (ref 3.87–5.11)
RDW: 19 % — AB (ref 11.5–15.5)
WBC: 11.8 10*3/uL — ABNORMAL HIGH (ref 4.0–10.5)

## 2014-09-30 LAB — BASIC METABOLIC PANEL
Anion gap: 9 (ref 5–15)
BUN: 7 mg/dL (ref 6–23)
CO2: 28 mmol/L (ref 19–32)
Calcium: 8.4 mg/dL (ref 8.4–10.5)
Chloride: 102 mEq/L (ref 96–112)
Creatinine, Ser: 0.76 mg/dL (ref 0.50–1.10)
GFR calc Af Amer: >90 mL/min (ref >90)
GFR calc non Af Amer: >90 mL/min (ref >90)
Glucose, Bld: 125 mg/dL — ABNORMAL HIGH (ref 70–99)
POTASSIUM: 3.7 mmol/L (ref 3.5–5.1)
SODIUM: 139 mmol/L (ref 135–145)

## 2014-09-30 LAB — BLOOD GAS, ARTERIAL
Acid-base deficit: 2.9 mmol/L — ABNORMAL HIGH (ref 0.0–2.0)
BICARBONATE: 22.5 meq/L (ref 20.0–24.0)
Drawn by: 11249
O2 Content: 2 L/min
O2 Saturation: 95.2 %
PATIENT TEMPERATURE: 98.4
PCO2 ART: 44.3 mmHg (ref 35.0–45.0)
PH ART: 7.326 — AB (ref 7.350–7.450)
TCO2: 21.1 mmol/L (ref 0–100)
pO2, Arterial: 79.5 mmHg — ABNORMAL LOW (ref 80.0–100.0)

## 2014-09-30 LAB — TROPONIN I: Troponin I: 0.14 ng/mL — ABNORMAL HIGH (ref <0.031)

## 2014-09-30 MED ORDER — SODIUM CHLORIDE 0.9 % IV BOLUS (SEPSIS)
1000.0000 mL | Freq: Once | INTRAVENOUS | Status: AC
  Administered 2014-09-30: 1000 mL via INTRAVENOUS

## 2014-09-30 MED ORDER — ONDANSETRON HCL 4 MG/2ML IJ SOLN: 4.0000 mg | Freq: Four times a day (QID) | INTRAMUSCULAR | Status: DC | PRN

## 2014-09-30 MED ORDER — LORATADINE 10 MG PO TABS
10.0000 mg | ORAL_TABLET | Freq: Every day | ORAL | Status: DC
  Administered 2014-09-30 – 2014-10-02 (×3): 10 mg via ORAL
  Filled 2014-09-30 (×4): qty 1

## 2014-09-30 MED ORDER — ACETAMINOPHEN 650 MG RE SUPP: 650.0000 mg | Freq: Four times a day (QID) | RECTAL | Status: DC | PRN

## 2014-09-30 MED ORDER — HEPARIN SODIUM (PORCINE) 5000 UNIT/ML IJ SOLN
5000.0000 [IU] | Freq: Three times a day (TID) | INTRAMUSCULAR | Status: DC
  Administered 2014-09-30 – 2014-10-02 (×5): 5000 [IU] via SUBCUTANEOUS
  Filled 2014-09-30 (×8): qty 1

## 2014-09-30 MED ORDER — HYDROCODONE-ACETAMINOPHEN 5-325 MG PO TABS: 1.0000 | ORAL_TABLET | ORAL | Status: DC | PRN

## 2014-09-30 MED ORDER — ALBUTEROL (5 MG/ML) CONTINUOUS INHALATION SOLN
10.0000 mg/h | INHALATION_SOLUTION | RESPIRATORY_TRACT | Status: AC
  Administered 2014-09-30: 10 mg/h via RESPIRATORY_TRACT
  Filled 2014-09-30: qty 20

## 2014-09-30 MED ORDER — DIPHENHYDRAMINE HCL 25 MG PO CAPS: 25.0000 mg | ORAL_CAPSULE | ORAL | Status: DC | PRN

## 2014-09-30 MED ORDER — DIPHENHYDRAMINE HCL 50 MG/ML IJ SOLN
50.0000 mg | Freq: Once | INTRAMUSCULAR | Status: AC
  Administered 2014-09-30: 50 mg via INTRAVENOUS
  Filled 2014-09-30: qty 1

## 2014-09-30 MED ORDER — ACETAMINOPHEN 325 MG PO TABS: 650.0000 mg | ORAL_TABLET | Freq: Four times a day (QID) | ORAL | Status: DC | PRN

## 2014-09-30 MED ORDER — LEVALBUTEROL HCL 1.25 MG/0.5ML IN NEBU
1.2500 mg | INHALATION_SOLUTION | Freq: Four times a day (QID) | RESPIRATORY_TRACT | Status: DC
  Filled 2014-09-30: qty 0.5

## 2014-09-30 MED ORDER — ALBUTEROL SULFATE (2.5 MG/3ML) 0.083% IN NEBU: 2.5000 mg | INHALATION_SOLUTION | RESPIRATORY_TRACT | Status: AC | PRN

## 2014-09-30 MED ORDER — FAMOTIDINE IN NACL 20-0.9 MG/50ML-% IV SOLN
20.0000 mg | Freq: Once | INTRAVENOUS | Status: AC
  Administered 2014-09-30: 20 mg via INTRAVENOUS
  Filled 2014-09-30: qty 50

## 2014-09-30 MED ORDER — ONDANSETRON HCL 4 MG PO TABS: 4.0000 mg | ORAL_TABLET | Freq: Four times a day (QID) | ORAL | Status: DC | PRN

## 2014-09-30 MED ORDER — IPRATROPIUM BROMIDE 0.06 % NA SOLN
2.0000 | Freq: Four times a day (QID) | NASAL | Status: DC | PRN
  Filled 2014-09-30: qty 15

## 2014-09-30 MED ORDER — IPRATROPIUM BROMIDE 0.02 % IN SOLN
1.0000 mg | Freq: Once | RESPIRATORY_TRACT | Status: AC
  Administered 2014-09-30: 1 mg via RESPIRATORY_TRACT
  Filled 2014-09-30: qty 5

## 2014-09-30 MED ORDER — METHYLPREDNISOLONE SODIUM SUCC 125 MG IJ SOLR
60.0000 mg | Freq: Four times a day (QID) | INTRAMUSCULAR | Status: DC
  Administered 2014-09-30 – 2014-10-02 (×8): 60 mg via INTRAVENOUS
  Filled 2014-09-30 (×9): qty 0.96

## 2014-09-30 NOTE — ED Provider Notes (Signed)
CSN: [id number]     Arrival date & time 09/30/14  1459 History   First MD Initiated Contact with Patient 09/30/14 1502     Chief Complaint  Patient presents with  . Shortness of Breath  . Allergic Reaction     (Consider location/radiation/quality/duration/timing/severity/associated sxs/prior Treatment) The history is provided by the patient and medical records. No language interpreter was used.     April Ellis is a 39 y.o. female  with a hx of asthma and COPD presents to the Emergency Department complaining of gradual, persistent, progressive and rapidly worsening onset SOB after eating [employer].  Pt reports approx 15 min after eating a variety meal at [employer] she began to have an itching mouth and throat with associated wheezing and SOB.  Pt reports taking 25mg  Benadryl PO and completing a 5mg  albuterol neb at home with no improvement prompting her 911 call.  Pt was given 10mg  of albuterol and 125mg  solumedrol IV enroute with moderate improvement.  Pt reports she never had a rash, nausea or vomiting.  Pt reports that her itching has resolved but she continues to feel very SOB.  Pt noted to be hypoxic with EMS into the upper 80's and remains there now.  Pt reports previous hospitalizations for her asthma, but denies hx of intubation.  Pt also reports approx 1 week of URI symptoms including rhinorrhea, sore throat, postnasal drip, cough, chest congestion but denies fevers, chills, headache, neck pain, chest pain, abdominal pain, nausea, vomiting, diarrhea, weakness, dizziness, syncope..     Past Medical History  Diagnosis Date  . Anemia   . Bronchitis   . Asthma   . Obesity   . COPD (chronic obstructive pulmonary disease)    Past Surgical History  Procedure Laterality Date  . No past surgeries     Family History  Problem Relation Age of Onset  . Diabetes Mother   . Diabetes Sister   . Hypertension Sister   . Hypertension Sister    History  Substance Use Topics  . Smoking  status: Former Smoker -- 0.25 packs/day for 15 years    Types: Cigarettes    Quit date: 07/23/2014  . Smokeless tobacco: Never Used  . Alcohol Use: No   OB History    No data available     Review of Systems  Constitutional: Negative for fever, diaphoresis, appetite change, fatigue and unexpected weight change.  HENT: Negative for mouth sores.        Throat Itching  Eyes: Negative for visual disturbance.  Respiratory: Positive for cough, shortness of breath and wheezing. Negative for chest tightness.   Cardiovascular: Negative for chest pain.  Gastrointestinal: Negative for nausea, vomiting, abdominal pain, diarrhea and constipation.  Endocrine: Negative for polydipsia, polyphagia and polyuria.  Genitourinary: Negative for dysuria, urgency, frequency and hematuria.  Musculoskeletal: Negative for back pain and neck stiffness.  Skin: Negative for rash.  Allergic/Immunologic: Negative for immunocompromised state.  Neurological: Negative for syncope, light-headedness and headaches.  Hematological: Does not bruise/bleed easily.  Psychiatric/Behavioral: Negative for sleep disturbance. The patient is not nervous/anxious.       Allergies  Eggs or egg-derived products  Home Medications   Prior to Admission medications   Medication Sig Start Date End Date Taking? Authorizing Provider  albuterol (PROVENTIL HFA;VENTOLIN HFA) 108 (90 BASE) MCG/ACT inhaler Inhale 2 puffs into the lungs every 4 (four) hours as needed for wheezing or shortness of breath. 09/02/14  Yes Shawnee Knapp, MD  albuterol (PROVENTIL) (2.5 MG/3ML) 0.083% nebulizer  solution Take 3 mLs (2.5 mg total) by nebulization every 4 (four) hours as needed for wheezing or shortness of breath. 09/02/14  Yes Shawnee Knapp, MD  azithromycin (ZITHROMAX) 250 MG tablet Take 2 tabs PO x 1 dose, then 1 tab PO QD x 4 days Patient taking differently: Take 250-500 mg by mouth daily. Take 2 tabs PO x 1 dose, then 1 tab PO QD x 4 days 09/02/14  Yes Shawnee Knapp, MD  guaiFENesin (MUCINEX) 600 MG 12 hr tablet Take 2 tablets (1,200 mg total) by mouth 2 (two) times daily. Use for 5 days then stop Patient taking differently: Take 1,200 mg by mouth 2 (two) times daily.  05/14/14  Yes Eugenie Filler, MD  mometasone-formoterol (DULERA) 200-5 MCG/ACT AERO Inhale 2 puffs into the lungs 2 (two) times daily. 09/02/14  Yes Shawnee Knapp, MD  predniSONE (DELTASONE) 20 MG tablet 3 tabs x 2d, 2 tabs x 2d, 1 tab x 2d, 1/2 tab x 4d, then stop Patient not taking: Reported on 09/30/2014 09/02/14   Shawnee Knapp, MD   BP 129/63 mmHg  Pulse 124  Temp(Src) 98.1 F (36.7 C) (Oral)  Resp 17  SpO2 95%  LMP 08/29/2014 Physical Exam  Constitutional: She is oriented to person, place, and time. She appears well-developed and well-nourished. No distress.  HENT:  Head: Normocephalic and atraumatic.  Right Ear: Tympanic membrane, external ear and ear canal normal.  Left Ear: Tympanic membrane, external ear and ear canal normal.  Nose: Mucosal edema and rhinorrhea present. No epistaxis. Right sinus exhibits no maxillary sinus tenderness and no frontal sinus tenderness. Left sinus exhibits no maxillary sinus tenderness and no frontal sinus tenderness.  Mouth/Throat: Uvula is midline, oropharynx is clear and moist and mucous membranes are normal. Mucous membranes are not pale and not cyanotic. No oropharyngeal exudate, posterior oropharyngeal edema, posterior oropharyngeal erythema or tonsillar abscesses.  Eyes: Conjunctivae are normal. Pupils are equal, round, and reactive to light.  Neck: Normal range of motion and full passive range of motion without pain.  Cardiovascular: S1 normal, S2 normal, normal heart sounds and intact distal pulses.  Tachycardia present.   No murmur heard. Pulses:      Radial pulses are 2+ on the right side, and 2+ on the left side.       Dorsalis pedis pulses are 2+ on the right side, and 2+ on the left side.  Tachycardia  Pulmonary/Chest: Accessory  muscle usage present. No stridor. Tachypnea noted. She has decreased breath sounds. She has wheezes. She has rhonchi.  Tachypnea with accessory muscle usage Decreased breath sounds throughout with inspiratory and expiratory wheezes, scattered rhonchi  Abdominal: Soft. Bowel sounds are normal. She exhibits no distension. There is no tenderness.  Musculoskeletal: Normal range of motion.  Lymphadenopathy:    She has no cervical adenopathy.  Neurological: She is alert and oriented to person, place, and time.  Skin: Skin is warm and dry. No rash noted. She is not diaphoretic. No erythema.  Psychiatric: She has a normal mood and affect.  Nursing note and vitals reviewed.   ED Course  Procedures (including critical care time) Labs Review Labs Reviewed  CBC - Abnormal; Notable for the following:    WBC 11.8 (*)    Hemoglobin 10.5 (*)    MCV 77.6 (*)    MCH 21.7 (*)    MCHC 28.0 (*)    RDW 19.0 (*)    All other components within normal limits  BASIC  METABOLIC PANEL - Abnormal; Notable for the following:    Glucose, Bld 125 (*)    All other components within normal limits    Imaging Review Dg Chest 2 View  09/30/2014   CLINICAL DATA:  Shortness of breath today. COPD. History of asthma.  EXAM: CHEST  2 VIEW  COMPARISON:  08/13/2014.  FINDINGS: Stable enlarged cardiac silhouette. Clear lungs with normal vascularity. Normal appearing bones.  IMPRESSION: Stable cardiomegaly.  No acute abnormality.   Electronically Signed   By: Enrique Sack M.D.   On: 09/30/2014 17:22     EKG Interpretation None      MDM   Final diagnoses:  Wheezing  SOB (shortness of breath)  Cough  Allergic reaction, initial encounter   April Ellis presents with questionable allergic reaction and asthma exacerbation. Patient with hypoxia on EMS arrival and again he is here in the emergency department.  He does not wear oxygen at home. Patient will be given hour-long nebulizer, Benadryl, upset and  reassess.  7:07 PM Patient has completed a total of 25 mg of albuterol with persistent wheezing. While ambulating on room air patient's oxygen saturations dropped to 87%.  No signs of anaphylaxis, no stridor, abdominal involvement or rash.  She remains tachycardic likely secondary to her large amount of albuterol. Patient will need admission.  7:40 PM Pt discussed with Dr. Linna Darner who will admit to tele.    BP 129/63 mmHg  Pulse 124  Temp(Src) 98.1 F (36.7 C) (Oral)  Resp 17  SpO2 95%  LMP 08/29/2014  The patient was discussed with and seen by Dr. Ralene Bathe who agrees with the treatment plan.    Jarrett Soho Harjot Zavadil, PA-C 09/30/14 [place], MD 09/30/14 2211

## 2014-09-30 NOTE — ED Notes (Signed)
Per EMS pt comes from home c/o [facility]. Pt stated that she ate [employer], then little while later had itching in her throat and SHOB. Pt took Benadryl 25mg  and home neb treatment.  Pt was given another 2 duoneb treatments in route (10mg  albuterol) and and 125mg  Solu-medrol.  Pt has wheezing in upper and lower. Pt has had cold like symptoms since December and was seen at her PCP.  Pt PMH COPD. 96% on room air.after pt talking and pt ranging from 89-93% on room air.

## 2014-09-30 NOTE — ED Notes (Signed)
Patient ambulated 40 feet with no assistance. At the beginning of the ambulation patients O2 saturation was at 95% on room air. At the end of the ambulation patient's O2 saturation was 87%.

## 2014-09-30 NOTE — H&P (Addendum)
[facility] History and Physical  April Ellis AYT:[id number] DOB: 11-Feb-1976 DOA: 09/30/2014  Referring physician: PA Beryl Meager PCP: Ruben Reason, MD   Chief Complaint: WHeezing  HPI: April Ellis is a 39 y.o. female  Developed runny nose, cough and wheezing 1 week ago. Acutely worsened today after eating at [facility] (hamburger, chicken nuggets and fries - usual meal). Associated w/ scratchy throat, skin itching. No hives. Benadryl w/ some benefit. 2 nebs at home w/o improvement. Started on [facility] 5 days ago for URI w/o benefit.    Review of Systems:  Constitutional:  No weight loss, night sweats, chills, fatigue.  HEENT:  No , Difficulty swallowing,  Cardio-vascular:  No chest pain, Orthopnea, PND, swelling in lower extremities, anasarca, dizziness, palpitations  GI:  No heartburn, indigestion, abdominal pain, nausea, vomiting, diarrhea, change in bowel habits, loss of appetite  Resp:  Per HPI Skin:  no rash or lesions.  GU:  no dysuria, change in color of urine, no urgency or frequency. No flank pain.  Musculoskeletal:  No joint pain or swelling. No decreased range of motion. No back pain.  Psych:  No change in mood or affect. No depression or anxiety. No memory loss.   Past Medical History  Diagnosis Date  . Anemia   . Bronchitis   . Asthma   . Obesity   . COPD (chronic obstructive pulmonary disease)    Past Surgical History  Procedure Laterality Date  . No past surgeries     Social History:  reports that she quit smoking about 2 weeks ago. Her smoking use included Cigarettes. She has a 3.75 pack-year smoking history. She has never used smokeless tobacco. She reports that she does not drink alcohol or use illicit drugs.  Allergies  Allergen Reactions  . Eggs Or Egg-Derived Products Nausea And Vomiting    Family History  Problem Relation Age of Onset  . Diabetes Mother   . Diabetes Sister   . Hypertension Sister   . Hypertension Sister       Prior to Admission medications   Medication Sig Start Date End Date Taking? Authorizing Provider  albuterol (PROVENTIL HFA;VENTOLIN HFA) 108 (90 BASE) MCG/ACT inhaler Inhale 2 puffs into the lungs every 4 (four) hours as needed for wheezing or shortness of breath. 09/02/14  Yes Shawnee Knapp, MD  albuterol (PROVENTIL) (2.5 MG/3ML) 0.083% nebulizer solution Take 3 mLs (2.5 mg total) by nebulization every 4 (four) hours as needed for wheezing or shortness of breath. 09/02/14  Yes Shawnee Knapp, MD  azithromycin (ZITHROMAX) 250 MG tablet Take 2 tabs PO x 1 dose, then 1 tab PO QD x 4 days Patient taking differently: Take 250-500 mg by mouth daily. Take 2 tabs PO x 1 dose, then 1 tab PO QD x 4 days 09/02/14  Yes Shawnee Knapp, MD  guaiFENesin (MUCINEX) 600 MG 12 hr tablet Take 2 tablets (1,200 mg total) by mouth 2 (two) times daily. Use for 5 days then stop Patient taking differently: Take 1,200 mg by mouth 2 (two) times daily.  05/14/14  Yes Eugenie Filler, MD  mometasone-formoterol (DULERA) 200-5 MCG/ACT AERO Inhale 2 puffs into the lungs 2 (two) times daily. 09/02/14  Yes Shawnee Knapp, MD  predniSONE (DELTASONE) 20 MG tablet 3 tabs x 2d, 2 tabs x 2d, 1 tab x 2d, 1/2 tab x 4d, then stop Patient not taking: Reported on 09/30/2014 09/02/14   Shawnee Knapp, MD   Physical Exam: Danley Danker Vitals:  09/30/14 1559 09/30/14 1845 09/30/14 1846 09/30/14 2030  BP:   129/63 135/64  Pulse:  121 124 113  Temp:    98.4 F (36.9 C)  TempSrc:    Oral  Resp:  17 17 20   Height:    5\' 2"  (1.575 m)  Weight:    179.08 kg (394 lb 12.8 oz)  SpO2: 99% 95% 95% 98%    Wt Readings from Last 3 Encounters:  09/30/14 179.08 kg (394 lb 12.8 oz)  09/02/14 172.82 kg (381 lb)  08/14/14 171.3 kg (377 lb 10.4 oz)    General: Appears calm and comfortable Eyes:  PERRL, normal lids, irises & conjunctiva ENT:  grossly normal hearing, lips & tongue Neck:  no LAD, masses or thyromegaly Cardiovascular:  RRR, no m/r/g. No LE edema. Telemetry:   SR, no arrhythmias  Respiratory: DIffuse wheezing in all lung fields. Good air movement. Nml effort. On 2L [place] Abdomen:  soft, ntnd Skin:  no rash or induration seen on limited exam Musculoskeletal:  grossly normal tone BUE/BLE Psychiatric:  grossly normal mood and affect, speech fluent and appropriate Neurologic:  grossly non-focal.          Labs on Admission:  Basic Metabolic Panel:  Recent Labs Lab 09/30/14 1648  NA 139  K 3.7  CL 102  CO2 28  GLUCOSE 125*  BUN 7  CREATININE 0.76  CALCIUM 8.4   Liver Function Tests: No results for input(s): AST, ALT, ALKPHOS, BILITOT, PROT, ALBUMIN in the last 168 hours. No results for input(s): LIPASE, AMYLASE in the last 168 hours. No results for input(s): AMMONIA in the last 168 hours. CBC:  Recent Labs Lab 09/30/14 1648  WBC 11.8*  HGB 10.5*  HCT 37.5  MCV 77.6*  PLT 283   Cardiac Enzymes: No results for input(s): CKTOTAL, CKMB, CKMBINDEX, TROPONINI in the last 168 hours.  BNP (last 3 results) No results for input(s): PROBNP in the last 8760 hours. CBG: No results for input(s): GLUCAP in the last 168 hours.  Radiological Exams on Admission: Dg Chest 2 View  09/30/2014   CLINICAL DATA:  Shortness of breath today. COPD. History of asthma.  EXAM: CHEST  2 VIEW  COMPARISON:  08/13/2014.  FINDINGS: Stable enlarged cardiac silhouette. Clear lungs with normal vascularity. Normal appearing bones.  IMPRESSION: Stable cardiomegaly.  No acute abnormality.   Electronically Signed   By: Enrique Sack M.D.   On: 09/30/2014 17:22    EKG: ordered  Assessment/Plan Principal Problem:   Asthma exacerbation Active Problems:   Morbid obesity   COPD (chronic obstructive pulmonary disease)   OSA (obstructive sleep apnea)   Viral URI   Sinus tachycardia   Leukocytosis  38yo F w/ h/o morbid obesity, COPD, OSA, and recent URI presenting w/ SOB and wheezing  Asthma exacerbation: NEw O2 requirement. Given solumedrol 125 by EMS and duoneb w/  benefit. H/o COPD and OSA. Continue dulera at time of discharge. CXR w/o acute process - Admit - Xopenex Q4 - Solumedrol 60mg  Q6 - Wean O2 PRN - ABG  Systemic allergic reaction: no overwhelming evidence of allergic reaction. No evidence of airway compromise. ?? Dietary etiology given acute onset after [employer]. Benadryl, pepcid and solumedrol w/ benefit.  - management as above - Benadryl   URI: likely viral as just finished Zpack w/o relief. ? Allergic component. - Intranasal atrovent - zyrtec - flonase after stopping oral steroids  OSA:  - CPAP  Sinus tach: Based on Telemetry. no EKG. Likely from stress  adn from albuterol. ? Mild dehydration - EKG - Xopenex - as above - 1L NS bolus - Troponin x1  Leukocytosis: WBC 11.8 likely from acute stress and viral uri   Code Status: FULL DVT Prophylaxis: Heparin Family Communication: None Disposition Plan: pending improvement   Decorey Wahlert, [facility] [email or web address] Password [email or web address]

## 2014-09-30 NOTE — ED Notes (Signed)
Bed: PE16 Expected date:  Expected time:  Means of arrival:  Comments: EMS- SOB, possible allergic reaction

## 2014-09-30 NOTE — Progress Notes (Signed)
Patient refuses CPAP per RN.

## 2014-10-01 DIAGNOSIS — J45901 Unspecified asthma with (acute) exacerbation: Principal | ICD-10-CM

## 2014-10-01 LAB — TROPONIN I: TROPONIN I: 0.09 ng/mL — AB (ref <0.031)

## 2014-10-01 MED ORDER — LEVALBUTEROL HCL 0.63 MG/3ML IN NEBU
0.6300 mg | INHALATION_SOLUTION | RESPIRATORY_TRACT | Status: DC | PRN
Start: 2014-10-01 — End: 2014-10-02
  Administered 2014-10-01: 0.63 mg via RESPIRATORY_TRACT
  Filled 2014-10-01: qty 3

## 2014-10-01 MED ORDER — LEVALBUTEROL HCL 1.25 MG/0.5ML IN NEBU
1.2500 mg | INHALATION_SOLUTION | Freq: Four times a day (QID) | RESPIRATORY_TRACT | Status: DC
  Administered 2014-10-01 (×3): 1.25 mg via RESPIRATORY_TRACT
  Filled 2014-10-01 (×4): qty 0.5

## 2014-10-01 NOTE — Progress Notes (Signed)
[facility] History and Physical  April Ellis GQQ:[id number] DOB: [birth year]    PCP:   Ruben Reason, MD   39 yo female morbidly obese with hx of asthma, admitted for asthma exacerbation.  She was given IV Steroid, nebs, and gotten better. She doesn' t have suppressive therapy, just rescue inhaler and nebs.  She hasn't established testing to obtain a CPAP machine.  Rewiew of Systems:  Constitutional: Negative for malaise, fever and chills. No significant weight loss or weight gain Eyes: Negative for eye pain, redness and discharge, diplopia, visual changes, or flashes of light. ENMT: Negative for ear pain, hoarseness, nasal congestion, sinus pressure and sore throat. No headaches; tinnitus, drooling, or problem swallowing. Cardiovascular: Negative for chest pain, palpitations, diaphoresis, dyspnea and peripheral edema. ; No orthopnea, PND Respiratory: Negative for cough, hemoptysis, wheezing and stridor. No pleuritic chestpain. Gastrointestinal: Negative for nausea, vomiting, diarrhea, constipation, abdominal pain, melena, blood in stool, hematemesis, jaundice and rectal bleeding.    Genitourinary: Negative for frequency, dysuria, incontinence,flank pain and hematuria; Musculoskeletal: Negative for back pain and neck pain. Negative for swelling and trauma.;  Skin: . Negative for pruritus, rash, abrasions, bruising and skin lesion.; ulcerations Neuro: Negative for headache, lightheadedness and neck stiffness. Negative for weakness, altered level of consciousness , altered mental status, extremity weakness, burning feet, involuntary movement, seizure and syncope.  Psych: negative for anxiety, depression, insomnia, tearfulness, panic attacks, hallucinations, paranoia, suicidal or homicidal ideation    Past Medical History  Diagnosis Date  . Anemia   . Bronchitis   . Asthma   . Obesity   . COPD (chronic obstructive pulmonary disease)     Past Surgical History  Procedure  Laterality Date  . No past surgeries      Medications:  HOME MEDS: Prior to Admission medications   Medication Sig Start Date End Date Taking? Authorizing Provider  albuterol (PROVENTIL HFA;VENTOLIN HFA) 108 (90 BASE) MCG/ACT inhaler Inhale 2 puffs into the lungs every 4 (four) hours as needed for wheezing or shortness of breath. 09/02/14  Yes Shawnee Knapp, MD  albuterol (PROVENTIL) (2.5 MG/3ML) 0.083% nebulizer solution Take 3 mLs (2.5 mg total) by nebulization every 4 (four) hours as needed for wheezing or shortness of breath. 09/02/14  Yes Shawnee Knapp, MD  azithromycin (ZITHROMAX) 250 MG tablet Take 2 tabs PO x 1 dose, then 1 tab PO QD x 4 days Patient taking differently: Take 250-500 mg by mouth daily. Take 2 tabs PO x 1 dose, then 1 tab PO QD x 4 days 09/02/14  Yes Shawnee Knapp, MD  guaiFENesin (MUCINEX) 600 MG 12 hr tablet Take 2 tablets (1,200 mg total) by mouth 2 (two) times daily. Use for 5 days then stop Patient taking differently: Take 1,200 mg by mouth 2 (two) times daily.  05/14/14  Yes Eugenie Filler, MD  mometasone-formoterol (DULERA) 200-5 MCG/ACT AERO Inhale 2 puffs into the lungs 2 (two) times daily. 09/02/14  Yes Shawnee Knapp, MD     Allergies:  Allergies  Allergen Reactions  . Eggs Or Egg-Derived Products Nausea And Vomiting    Social History:   reports that she quit smoking about 2 weeks ago. Her smoking use included Cigarettes. She has a 3.75 pack-year smoking history. She has never used smokeless tobacco. She reports that she does not drink alcohol or use illicit drugs.  Family History: Family History  Problem Relation Age of Onset  . Diabetes Mother   . Diabetes Sister   . Hypertension Sister   .  Hypertension Sister      Physical Exam: Filed Vitals:   10/01/14 0528 10/01/14 0905 10/01/14 1306 10/01/14 1420  BP: 141/65  147/68   Pulse: 100  71   Temp: 98.3 F (36.8 C)  98.1 F (36.7 C)   TempSrc: Oral  Oral   Resp: 20  18   Height:      Weight:       SpO2: 98% 94% 95% 95%   Blood pressure 147/68, pulse 71, temperature 98.1 F (36.7 C), temperature source Oral, resp. rate 18, height 5\' 2"  (1.575 m), weight 179.08 kg (394 lb 12.8 oz), last menstrual period 08/29/2014, SpO2 95 %.  GEN:  Pleasant  patient lying in the stretcher in no acute distress; cooperative with exam. PSYCH:  alert and oriented x4; does not appear anxious or depressed; affect is appropriate. HEENT: Mucous membranes pink and anicteric; PERRLA; EOM intact; no cervical lymphadenopathy nor thyromegaly or carotid bruit; no JVD; There were no stridor. Neck is very supple. Breasts:: Not examined CHEST WALL: No tenderness CHEST: Normal respiration, she has bilateral insp and ext wheezing. HEART: Regular rate and rhythm.  There are no murmur, rub, or gallops.   BACK: No kyphosis or scoliosis; no CVA tenderness ABDOMEN: soft and non-tender; no masses, no organomegaly, normal abdominal bowel sounds; no pannus; no intertriginous candida. There is no rebound and no distention. Rectal Exam: Not done EXTREMITIES: No bone or joint deformity; age-appropriate arthropathy of the hands and knees; no edema; no ulcerations.  There is no calf tenderness. Genitalia: not examined PULSES: 2+ and symmetric SKIN: Normal hydration no rash or ulceration CNS: Cranial nerves 2-12 grossly intact no focal lateralizing neurologic deficit.  Speech is fluent; uvula elevated with phonation, facial symmetry and tongue midline. DTR are normal bilaterally, cerebella exam is intact, barbinski is negative and strengths are equaled bilaterally.  No sensory loss.   Labs on Admission:  Basic Metabolic Panel:  Recent Labs Lab 09/30/14 1648  NA 139  K 3.7  CL 102  CO2 28  GLUCOSE 125*  BUN 7  CREATININE 0.76  CALCIUM 8.4   CBC:  Recent Labs Lab 09/30/14 1648  WBC 11.8*  HGB 10.5*  HCT 37.5  MCV 77.6*  PLT 283   Cardiac Enzymes:  Recent Labs Lab 09/30/14 2215 10/01/14 0453  TROPONINI 0.14*  0.09*    CBG: No results for input(s): GLUCAP in the last 168 hours.   Radiological Exams on Admission: Dg Chest 2 View  09/30/2014   CLINICAL DATA:  Shortness of breath today. COPD. History of asthma.  EXAM: CHEST  2 VIEW  COMPARISON:  08/13/2014.  FINDINGS: Stable enlarged cardiac silhouette. Clear lungs with normal vascularity. Normal appearing bones.  IMPRESSION: Stable cardiomegaly.  No acute abnormality.   Electronically Signed   By: Enrique Sack M.D.   On: 09/30/2014 17:22   Assessment/Plan Present on Admission:  . Asthma exacerbation . COPD (chronic obstructive pulmonary disease) . Morbid obesity . OSA (obstructive sleep apnea)  PLAN:  Will continue with nebs, steroids, and IVF.  She will need a maintenance inhaler upon discharge.  She will need to follow up with her pulmonologist to get sleep study qualifying for the CPAP machine.    Other plans as per orders.  Code Status: FULL Haskel Khan, MD. [facility] Pager [phone] 7pm to 7am.  10/01/2014, 3:44 PM

## 2014-10-01 NOTE — Care Management Note (Signed)
    Page 1 of 1   10/01/2014     11:48:13 AM CARE MANAGEMENT NOTE 10/01/2014  Patient:  April Ellis, April Ellis   Account Number:  [id number]  Date Initiated:  10/01/2014  Documentation initiated by:  Dessa Phi  Subjective/Objective Assessment:   39 y/o f admitted w/Asthma exac.     Action/Plan:   From home.   Anticipated DC Date:  10/02/2014   Anticipated DC Plan:  [place]  CM consult      Choice offered to / List presented to:             Status of service:  In process, will continue to follow Medicare Important Message given?   (If response is "NO", the following Medicare IM given date fields will be blank) Date Medicare IM given:   Medicare IM given by:   Date Additional Medicare IM given:   Additional Medicare IM given by:    Discharge Disposition:    Per UR Regulation:  Reviewed for med. necessity/level of care/duration of stay  If discussed at [place] of Stay Meetings, dates discussed:    Comments:  10/01/14 Dessa Phi RN BSN NCM [id number] No anticipated d/c needs.

## 2014-10-02 MED ORDER — PREDNISONE 20 MG PO TABS: 20.0000 mg | ORAL_TABLET | Freq: Every day | ORAL | Status: DC

## 2014-10-02 MED ORDER — PREDNISONE 10 MG PO TABS: ORAL_TABLET | ORAL | Status: DC

## 2014-10-02 MED ORDER — PREDNISONE 10 MG PO TABS: 60.0000 mg | ORAL_TABLET | Freq: Every day | ORAL | Status: DC

## 2014-10-02 MED ORDER — PREDNISONE 10 MG PO TABS: 10.0000 mg | ORAL_TABLET | Freq: Every day | ORAL | Status: DC

## 2014-10-02 MED ORDER — PREDNISONE 50 MG PO TABS: 50.0000 mg | ORAL_TABLET | Freq: Every day | ORAL | Status: DC

## 2014-10-02 MED ORDER — PREDNISONE 20 MG PO TABS: 40.0000 mg | ORAL_TABLET | Freq: Every day | ORAL | Status: DC

## 2014-10-02 MED ORDER — PREDNISONE 50 MG PO TABS: 60.0000 mg | ORAL_TABLET | Freq: Every day | ORAL | Status: DC

## 2014-10-02 MED ORDER — PREDNISONE 20 MG PO TABS: 30.0000 mg | ORAL_TABLET | Freq: Every day | ORAL | Status: DC

## 2014-10-02 MED ORDER — LEVALBUTEROL HCL 1.25 MG/0.5ML IN NEBU
1.2500 mg | INHALATION_SOLUTION | Freq: Three times a day (TID) | RESPIRATORY_TRACT | Status: DC
  Administered 2014-10-02 (×2): 1.25 mg via RESPIRATORY_TRACT
  Filled 2014-10-02 (×3): qty 0.5

## 2014-10-02 MED ORDER — ALBUTEROL SULFATE (2.5 MG/3ML) 0.083% IN NEBU: 2.5000 mg | INHALATION_SOLUTION | Freq: Four times a day (QID) | RESPIRATORY_TRACT | Status: DC | PRN

## 2014-10-02 MED ORDER — FLUTICASONE-SALMETEROL 100-50 MCG/DOSE IN AEPB: 1.0000 | INHALATION_SPRAY | Freq: Two times a day (BID) | RESPIRATORY_TRACT | Status: DC

## 2014-10-02 NOTE — Discharge Summary (Signed)
[facility] PROGRESS NOTE  April Ellis AVW:[id number] DOB: 01-17-1976    PCP:   Ruben Reason, MD   HPI: April Ellis is an 39 y.o. female admitted by Dr Linna Darner for asthma exacerbation and sinus tachycardia on 09/30/13. As per his H and P: " HPI: [name] is a 39 y.o. female  Developed runny nose, cough and wheezing 1 week ago. Acutely worsened today after eating at [facility] (hamburger, chicken nuggets and fries - usual meal). Associated w/ scratchy throat, skin itching. No hives. Benadryl w/ some benefit. 2 nebs at home w/o improvement. Started on Bono 5 days ago for URI w/o benefit.   HOSPITAL COURSE: Patient was admitted and she was given IV steroid, Xopinex nebs, and finishing her Z pak.  Her troponins was in the indeterminate ranges, peaked at 0.14, then went down.  She actually had slight elevation of her troponin a few months ago to 0.3.  It was felt that its unlikely to be ACS, and no evidence of chornic PE.  She felt well and will be discharged to home today. She will be given Advair 100/50 BID, along with albuterol for rescue inhaler. She will be given Prednisone 60mg , taper by 10mg  per day until fisnihed.  She will follow up with her pulmonologist and PCP next week.  She is anxious to go home and will be discharged home today.      DISCHARGE MEDICATIONS    Medication Sig Start Date End Date Taking? Authorizing Provider  albuterol (PROVENTIL HFA;VENTOLIN HFA) 108 (90 BASE) MCG/ACT inhaler Inhale 2 puffs into the lungs every 4 (four) hours as needed for wheezing or shortness of breath. 09/02/14  Yes Shawnee Knapp, MD  albuterol (PROVENTIL) (2.5 MG/3ML) 0.083% nebulizer solution Take 3 mLs (2.5 mg total) by nebulization every 4 (four) hours as needed for wheezing or shortness of breath. 09/02/14  Yes Shawnee Knapp, MD                       albuterol (PROVENTIL) (2.5 MG/3ML) 0.083% nebulizer solution Take 3 mLs (2.5 mg total) by nebulization every 6 (six) hours as  needed for wheezing or shortness of breath. 10/02/14   Orvan Falconer, MD  Fluticasone-Salmeterol (ADVAIR DISKUS) 100-50 MCG/DOSE AEPB Inhale 1 puff into the lungs 2 (two) times daily. 10/02/14   Orvan Falconer, MD  predniSONE (DELTASONE) 10 MG tablet Take 6 tablets (60 mg total) by mouth daily before breakfast. 10/03/14   Orvan Falconer, MD    DISCHARGE DIAGNOSIS:  . Asthma exacerbation . COPD (chronic obstructive pulmonary disease) . Morbid obesity . OSA (obstructive sleep apnea)  Code Status: FULL Haskel Khan, MD. [facility] Pager [phone] 7pm to 7am.  10/02/2014, 5:46 PM

## 2014-11-18 ENCOUNTER — Ambulatory Visit (INDEPENDENT_AMBULATORY_CARE_PROVIDER_SITE_OTHER): Payer: No Typology Code available for payment source | Admitting: Family Medicine

## 2014-11-18 VITALS — BP 152/105 | HR 109 | Temp 98.1°F | Resp 20 | Ht 62.0 in | Wt 397.4 lb

## 2014-11-18 DIAGNOSIS — R0602 Shortness of breath: Secondary | ICD-10-CM | POA: Diagnosis not present

## 2014-11-18 DIAGNOSIS — J4541 Moderate persistent asthma with (acute) exacerbation: Secondary | ICD-10-CM

## 2014-11-18 LAB — POCT CBC
Granulocyte percent: 64.2 %G (ref 37–80)
HCT, POC: 35.7 % — AB (ref 37.7–47.9)
Hemoglobin: 10.6 g/dL — AB (ref 12.2–16.2)
Lymph, poc: 2.8 (ref 0.6–3.4)
MCH: 22.4 pg — AB (ref 27–31.2)
MCHC: 29.7 g/dL — AB (ref 31.8–35.4)
MCV: 75.5 fL — AB (ref 80–97)
MID (cbc): 1.2 — AB (ref 0–0.9)
MPV: 6.8 fL (ref 0–99.8)
PLATELET COUNT, POC: 250 10*3/uL (ref 142–424)
POC Granulocyte: 7.2 — AB (ref 2–6.9)
POC LYMPH PERCENT: 24.8 %L (ref 10–50)
POC MID %: 11 % (ref 0–12)
RBC: 4.73 M/uL (ref 4.04–5.48)
RDW, POC: 18.7 %
WBC: 11.2 10*3/uL — AB (ref 4.6–10.2)

## 2014-11-18 MED ORDER — METHYLPREDNISOLONE SODIUM SUCC 125 MG IJ SOLR
125.0000 mg | Freq: Once | INTRAMUSCULAR | Status: AC
  Administered 2014-11-18: 125 mg via INTRAMUSCULAR

## 2014-11-18 MED ORDER — AZITHROMYCIN 500 MG PO TABS: 500.0000 mg | ORAL_TABLET | Freq: Every day | ORAL | Status: DC

## 2014-11-18 MED ORDER — ALBUTEROL SULFATE (2.5 MG/3ML) 0.083% IN NEBU
2.5000 mg | INHALATION_SOLUTION | RESPIRATORY_TRACT | Status: DC | PRN
Start: 2014-11-18 — End: 2015-04-30

## 2014-11-18 MED ORDER — PREDNISONE 20 MG PO TABS: ORAL_TABLET | ORAL | Status: DC

## 2014-11-18 MED ORDER — ALBUTEROL SULFATE (2.5 MG/3ML) 0.083% IN NEBU
2.5000 mg | INHALATION_SOLUTION | Freq: Once | RESPIRATORY_TRACT | Status: AC
  Administered 2014-11-18: 2.5 mg via RESPIRATORY_TRACT

## 2014-11-18 MED ORDER — FLUTICASONE-SALMETEROL 250-50 MCG/DOSE IN AEPB: 1.0000 | INHALATION_SPRAY | Freq: Two times a day (BID) | RESPIRATORY_TRACT | Status: DC

## 2014-11-18 NOTE — Progress Notes (Signed)
This chart was scribed for Delman Cheadle, MD by Einar Pheasant, ED Scribe. This patient was seen in room 6 and the patient's care was started at 8:30 PM.  Subjective:    Patient ID: April Ellis, female    DOB: 06-Jan-1976, 39 y.o.   MRN: [id number]  Chief Complaint  Patient presents with  . Cough  . Wheezing  . Shortness of Breath    HPI April Ellis is a 39 y.o. female with PMhx of COPD. Pt stopped smoking 2 months ago and had an exacerbation of her asthma for which she did an albuterol treatment for with minimal relief. Pt is still complaining of associated SOB, wheezing, and cough with unspecified color sputum. She states that she had one sick contact (Nephew)  last week and started to experience cold like symptoms over the weekend. Her symptoms started out with sneezing and nasal congestion. Symptoms have gradually worsened to effect her breathing. She endorses using her albuterol inhaler approximately 1-2 a day. Also endorses doing multiple nebulizer treatments over the course of the day. Pt denies ever needing to be on home oxygen. She denies any fever, neck pain, sore throat, visual disturbance, CP,abdominal pain, nausea, emesis, diarrhea, urinary symptoms, back pain, HA, weakness, numbness and rash as associated symptoms.    Pt is also requesting a medication refill on Advair. She was first prescribed it at her last visit to the ED.   Past Medical History  Diagnosis Date  . Anemia   . Bronchitis   . Asthma   . Obesity   . COPD (chronic obstructive pulmonary disease)    Allergies  Allergen Reactions  . Eggs Or Egg-Derived Products Nausea And Vomiting   Current Outpatient Prescriptions on File Prior to Visit  Medication Sig Dispense Refill  . albuterol (PROVENTIL HFA;VENTOLIN HFA) 108 (90 BASE) MCG/ACT inhaler Inhale 2 puffs into the lungs every 4 (four) hours as needed for wheezing or shortness of breath. 1 Inhaler 4  . albuterol (PROVENTIL) (2.5 MG/3ML) 0.083% nebulizer  solution Take 3 mLs (2.5 mg total) by nebulization every 4 (four) hours as needed for wheezing or shortness of breath. 150 mL 2  . Fluticasone-Salmeterol (ADVAIR DISKUS) 100-50 MCG/DOSE AEPB Inhale 1 puff into the lungs 2 (two) times daily. 60 each 3   No current facility-administered medications on file prior to visit.    Review of Systems  Constitutional: Negative for fever and chills.  HENT: Negative for congestion, ear pain, rhinorrhea, sinus pressure, sore throat, trouble swallowing and voice change.   Eyes: Negative for discharge.  Respiratory: Positive for cough, shortness of breath and wheezing. Negative for stridor.   Cardiovascular: Negative for chest pain.  Gastrointestinal: Negative for nausea, vomiting and abdominal pain.  Genitourinary: Negative.   Musculoskeletal: Negative for myalgias.  Skin: Negative for rash.  Neurological: Negative for weakness, numbness and headaches.    Triage Vitals: BP 152/105 mmHg  Pulse 109  Temp(Src) 98.1 F (36.7 C) (Oral)  Resp 20  Ht 5\' 2"  (1.575 m)  Wt 397 lb 6 oz (180.248 kg)  BMI 72.66 kg/m2  SpO2 76%  LMP 11/01/2014  Objective:   Physical Exam  Constitutional: She appears well-developed and well-nourished. No distress.  HENT:  Head: Normocephalic and atraumatic.  Eyes: Conjunctivae are normal. Right eye exhibits no discharge. Left eye exhibits no discharge.  Neck: Neck supple.  Cardiovascular: Normal rate, regular rhythm and normal heart sounds.  Exam reveals no gallop and no friction rub.   No murmur  heard. Pulmonary/Chest: Effort normal. No respiratory distress. She has wheezes.  Oxygen saturation percentage 92/93 upon evaluation.   Abdominal: Soft. She exhibits no distension. There is no tenderness.  Musculoskeletal: She exhibits no edema or tenderness.  Neurological: She is alert.  Skin: Skin is warm and dry.  Psychiatric: She has a normal mood and affect. Her behavior is normal. Thought content normal.  Nursing note  and vitals reviewed.  Results for orders placed or performed in visit on 11/18/14  POCT CBC  Result Value Ref Range   WBC 11.2 (A) 4.6 - 10.2 K/uL   Lymph, poc 2.8 0.6 - 3.4   POC LYMPH PERCENT 24.8 10 - 50 %L   MID (cbc) 1.2 (A) 0 - 0.9   POC MID % 11.0 0 - 12 %M   POC Granulocyte 7.2 (A) 2 - 6.9   Granulocyte percent 64.2 37 - 80 %G   RBC 4.73 4.04 - 5.48 M/uL   Hemoglobin 10.6 (A) 12.2 - 16.2 g/dL   HCT, POC 35.7 (A) 37.7 - 47.9 %   MCV 75.5 (A) 80 - 97 fL   MCH, POC 22.4 (A) 27 - 31.2 pg   MCHC 29.7 (A) 31.8 - 35.4 g/dL   RDW, POC 18.7 %   Platelet Count, POC 250 142 - 424 K/uL   MPV 6.8 0 - 99.8 fL    Assessment & Plan:   Shortness of breath - Plan: albuterol (PROVENTIL) (2.5 MG/3ML) 0.083% nebulizer solution 2.5 mg, POCT CBC, methylPREDNISolone sodium succinate (SOLU-MEDROL) 125 mg/2 mL injection 125 mg  SOB (shortness of breath)  Asthma with acute exacerbation, moderate persistent - Plan: albuterol (PROVENTIL) (2.5 MG/3ML) 0.083% nebulizer solution  Meds ordered this encounter  Medications  . albuterol (PROVENTIL) (2.5 MG/3ML) 0.083% nebulizer solution 2.5 mg    Sig:   . methylPREDNISolone sodium succinate (SOLU-MEDROL) 125 mg/2 mL injection 125 mg    Sig:   . albuterol (PROVENTIL) (2.5 MG/3ML) 0.083% nebulizer solution    Sig: Take 3 mLs (2.5 mg total) by nebulization every 4 (four) hours as needed for wheezing or shortness of breath.    Dispense:  150 mL    Refill:  3  . Fluticasone-Salmeterol (ADVAIR DISKUS) 250-50 MCG/DOSE AEPB    Sig: Inhale 1 puff into the lungs 2 (two) times daily.    Dispense:  60 each    Refill:  3  . predniSONE (DELTASONE) 20 MG tablet    Sig: 3 tabs po qd x 2d, then 2 tabs po qd x 3d    Dispense:  12 tablet    Refill:  0  . azithromycin (ZITHROMAX) 500 MG tablet    Sig: Take 1 tablet (500 mg total) by mouth daily.    Dispense:  3 tablet    Refill:  0    I personally performed the services described in this documentation, which  was scribed in my presence. The recorded information has been reviewed and considered, and addended by me as needed.  Delman Cheadle, MD MPH

## 2014-11-18 NOTE — Patient Instructions (Signed)
Asthma, Acute Bronchospasm °Acute bronchospasm caused by asthma is also referred to as an asthma attack. Bronchospasm means your air passages become narrowed. The narrowing is caused by inflammation and tightening of the muscles in the air tubes (bronchi) in your lungs. This can make it hard to breathe or cause you to wheeze and cough. °CAUSES °Possible triggers are: °· Animal dander from the skin, hair, or feathers of animals. °· Dust mites contained in house dust. °· Cockroaches. °· Pollen from trees or grass. °· Mold. °· Cigarette or tobacco smoke. °· Air pollutants such as dust, household cleaners, hair sprays, aerosol sprays, paint fumes, strong chemicals, or strong odors. °· Cold air or weather changes. Cold air may trigger inflammation. Winds increase molds and pollens in the air. °· Strong emotions such as crying or laughing hard. °· Stress. °· Certain medicines such as aspirin or beta-blockers. °· Sulfites in foods and drinks, such as dried fruits and wine. °· Infections or inflammatory conditions, such as a flu, cold, or inflammation of the nasal membranes (rhinitis). °· Gastroesophageal reflux disease (GERD). GERD is a condition where stomach acid backs up into your esophagus. °· Exercise or strenuous activity. °SIGNS AND SYMPTOMS  °· Wheezing. °· Excessive coughing, particularly at night. °· Chest tightness. °· Shortness of breath. °DIAGNOSIS  °Your health care provider will ask you about your medical history and perform a physical exam. A chest X-ray or blood testing may be performed to look for other causes of your symptoms or other conditions that may have triggered your asthma attack.  °TREATMENT  °Treatment is aimed at reducing inflammation and opening up the airways in your lungs.  Most asthma attacks are treated with inhaled medicines. These include quick relief or rescue medicines (such as bronchodilators) and controller medicines (such as inhaled corticosteroids). These medicines are sometimes  given through an inhaler or a nebulizer. Systemic steroid medicine taken by mouth or given through an IV tube also can be used to reduce the inflammation when an attack is moderate or severe. Antibiotic medicines are only used if a bacterial infection is present.  °HOME CARE INSTRUCTIONS  °· Rest. °· Drink plenty of liquids. This helps the mucus to remain thin and be easily coughed up. Only use caffeine in moderation and do not use alcohol until you have recovered from your illness. °· Do not smoke. Avoid being exposed to secondhand smoke. °· You play a critical role in keeping yourself in good health. Avoid exposure to things that cause you to wheeze or to have breathing problems. °· Keep your medicines up-to-date and available. Carefully follow your health care provider's treatment plan. °· Take your medicine exactly as prescribed. °· When pollen or pollution is bad, keep windows closed and use an air conditioner or go to places with air conditioning. °· Asthma requires careful medical care. See your health care provider for a follow-up as advised. If you are more than [redacted] weeks pregnant and you were prescribed any new medicines, let your obstetrician know about the visit and how you are doing. Follow up with your health care provider as directed. °· After you have recovered from your asthma attack, make an appointment with your outpatient doctor to talk about ways to reduce the likelihood of future attacks. If you do not have a doctor who manages your asthma, make an appointment with a primary care doctor to discuss your asthma. °SEEK IMMEDIATE MEDICAL CARE IF:  °· You are getting worse. °· You have trouble breathing. If severe, call your local   emergency services (911 in the [place] °· You develop chest pain or discomfort. °· You are vomiting. °· You are not able to keep fluids down. °· You are coughing up yellow, green, brown, or bloody sputum. °· You have a fever and your symptoms suddenly get worse. °· You have  trouble swallowing. °MAKE SURE YOU:  °· Understand these instructions. °· Will watch your condition. °· Will get help right away if you are not doing well or get worse. °Document Released: 12/29/2006 Document Revised: 09/18/2013 Document Reviewed: 03/21/2013 °ExitCare® Patient Information ©2015 [employer]. This information is not intended to replace advice given to you by your health care provider. Make sure you discuss any questions you have with your health care provider. ° °Asthma Attack Prevention °Although there is no way to prevent asthma from starting, you can take steps to control the disease and reduce its symptoms. Learn about your asthma and how to control it. Take an active role to control your asthma by working with your health care provider to create and follow an asthma action plan. An asthma action plan guides you in: °· Taking your medicines properly. °· Avoiding things that set off your asthma or make your asthma worse (asthma triggers). °· Tracking your level of asthma control. °· Responding to worsening asthma. °· Seeking emergency care when needed. °To track your asthma, keep records of your symptoms, check your peak flow number using a handheld device that shows how well air moves out of your lungs (peak flow meter), and get regular asthma checkups.  °WHAT ARE SOME WAYS TO PREVENT AN ASTHMA ATTACK? °· Take medicines as directed by your health care provider. °· Keep track of your asthma symptoms and level of control. °· With your health care provider, write a detailed plan for taking medicines and managing an asthma attack. Then be sure to follow your action plan. Asthma is an ongoing condition that needs regular monitoring and treatment. °· Identify and avoid asthma triggers. Many outdoor allergens and irritants (such as pollen, mold, cold air, and air pollution) can trigger asthma attacks. Find out what your asthma triggers are and take steps to avoid them. °· Monitor your breathing. Learn  to recognize warning signs of an attack, such as coughing, wheezing, or shortness of breath. Your lung function may decrease before you notice any signs or symptoms, so regularly measure and record your peak airflow with a home peak flow meter. °· Identify and treat attacks early. If you act quickly, you are less likely to have a severe attack. You will also need less medicine to control your symptoms. When your peak flow measurements decrease and alert you to an upcoming attack, take your medicine as instructed and immediately stop any activity that may have triggered the attack. If your symptoms do not improve, get medical help. °· Pay attention to increasing quick-relief inhaler use. If you find yourself relying on your quick-relief inhaler, your asthma is not under control. See your health care provider about adjusting your treatment. °WHAT CAN MAKE MY SYMPTOMS WORSE? °A number of common things can set off or make your asthma symptoms worse and cause temporary increased inflammation of your airways. Keep track of your asthma symptoms for several weeks, detailing all the environmental and emotional factors that are linked with your asthma. When you have an asthma attack, go back to your asthma diary to see which factor, or combination of factors, might have contributed to it. Once you know what these factors are, you can   take steps to control many of them. If you have allergies and asthma, it is important to take asthma prevention steps at home. Minimizing contact with the substance to which you are allergic will help prevent an asthma attack. Some triggers and ways to avoid these triggers are: °Animal Dander:  °Some people are allergic to the flakes of skin or dried saliva from animals with fur or feathers.  °· There is no such thing as a hypoallergenic dog or cat breed. All dogs or cats can cause allergies, even if they don't shed. °· Keep these pets out of your home. °· If you are not able to keep a pet  outdoors, keep the pet out of your bedroom and other sleeping areas at all times, and keep the door closed. °· Remove carpets and furniture covered with cloth from your home. If that is not possible, keep the pet away from fabric-covered furniture and carpets. °Dust Mites: °Many people with asthma are allergic to dust mites. Dust mites are tiny bugs that are found in every home in mattresses, pillows, carpets, fabric-covered furniture, bedcovers, clothes, stuffed toys, and other fabric-covered items.  °· Cover your mattress in a special dust-proof cover. °· Cover your pillow in a special dust-proof cover, or wash the pillow each week in hot water. Water must be hotter than 130° F (54.4° C) to kill dust mites. Cold or warm water used with detergent and bleach can also be effective. °· Wash the sheets and blankets on your bed each week in hot water. °· Try not to sleep or lie on cloth-covered cushions. °· Call ahead when traveling and ask for a smoke-free hotel room. Bring your own bedding and pillows in case the hotel only supplies feather pillows and down comforters, which may contain dust mites and cause asthma symptoms. °· Remove carpets from your bedroom and those laid on concrete, if you can. °· Keep stuffed toys out of the bed, or wash the toys weekly in hot water or cooler water with detergent and bleach. °Cockroaches: °Many people with asthma are allergic to the droppings and remains of cockroaches.  °· Keep food and garbage in closed containers. Never leave food out. °· Use poison baits, traps, powders, gels, or paste (for example, boric acid). °· If a spray is used to kill cockroaches, stay out of the room until the odor goes away. °Indoor Mold: °· Fix leaky faucets, pipes, or other sources of water that have mold around them. °· Clean floors and moldy surfaces with a fungicide or diluted bleach. °· Avoid using humidifiers, vaporizers, or swamp coolers. These can spread molds through the air. °Pollen and  Outdoor Mold: °· When pollen or mold spore counts are high, try to keep your windows closed. °· Stay indoors with windows closed from late morning to afternoon. Pollen and some mold spore counts are highest at that time. °· Ask your health care provider whether you need to take anti-inflammatory medicine or increase your dose of the medicine before your allergy season starts. °Other Irritants to Avoid: °· Tobacco smoke is an irritant. If you smoke, ask your health care provider how you can quit. Ask family members to quit smoking, too. Do not allow smoking in your home or car. °· If possible, do not use a wood-burning stove, kerosene heater, or fireplace. Minimize exposure to all sources of smoke, including incense, candles, fires, and fireworks. °· Try to stay away from strong odors and sprays, such as perfume, talcum powder, hair spray, and   paints. °· Decrease humidity in your home and use an indoor air cleaning device. Reduce indoor humidity to below 60%. Dehumidifiers or central air conditioners can do this. °· Decrease house dust exposure by changing furnace and air cooler filters frequently. °· Try to have someone else vacuum for you once or twice a week. Stay out of rooms while they are being vacuumed and for a short while afterward. °· If you vacuum, use a dust mask from a hardware store, a double-layered or microfilter vacuum cleaner bag, or a vacuum cleaner with a HEPA filter. °· Sulfites in foods and beverages can be irritants. Do not drink beer or wine or eat dried fruit, processed potatoes, or shrimp if they cause asthma symptoms. °· Cold air can trigger an asthma attack. Cover your nose and mouth with a scarf on cold or windy days. °· Several health conditions can make asthma more difficult to manage, including a runny nose, sinus infections, reflux disease, psychological stress, and sleep apnea. Work with your health care provider to manage these conditions. °· Avoid close contact with people who have  a respiratory infection such as a cold or the flu, since your asthma symptoms may get worse if you catch the infection. Wash your hands thoroughly after touching items that may have been handled by people with a respiratory infection. °· Get a flu shot every year to protect against the flu virus, which often makes asthma worse for days or weeks. Also get a pneumonia shot if you have not previously had one. Unlike the flu shot, the pneumonia shot does not need to be given yearly. °Medicines: °· Talk to your health care provider about whether it is safe for you to take aspirin or non-steroidal anti-inflammatory medicines (NSAIDs). In a small number of people with asthma, aspirin and NSAIDs can cause asthma attacks. These medicines must be avoided by people who have known aspirin-sensitive asthma. It is important that people with aspirin-sensitive asthma read labels of all over-the-counter medicines used to treat pain, colds, coughs, and fever. °· Beta-blockers and ACE inhibitors are other medicines you should discuss with your health care provider. °HOW CAN I FIND OUT WHAT I AM ALLERGIC TO? °Ask your asthma health care provider about allergy skin testing or blood testing (the RAST test) to identify the allergens to which you are sensitive. If you are found to have allergies, the most important thing to do is to try to avoid exposure to any allergens that you are sensitive to as much as possible. Other treatments for allergies, such as medicines and allergy shots (immunotherapy) are available.  °CAN I EXERCISE? °Follow your health care provider's advice regarding asthma treatment before exercising. It is important to maintain a regular exercise program, but vigorous exercise or exercise in cold, humid, or dry environments can cause asthma attacks, especially for those people who have exercise-induced asthma. °Document Released: 09/01/2009 Document Revised: 09/18/2013 Document Reviewed: 03/21/2013 °Exit[employer]® Patient  Information ©2015 [employer]. This information is not intended to replace advice given to you by your health care provider. Make sure you discuss any questions you have with your health care provider. ° °

## 2014-11-30 ENCOUNTER — Encounter (HOSPITAL_COMMUNITY): Payer: Self-pay | Admitting: Emergency Medicine

## 2014-11-30 ENCOUNTER — Emergency Department (HOSPITAL_COMMUNITY): Payer: No Typology Code available for payment source

## 2014-11-30 ENCOUNTER — Inpatient Hospital Stay (HOSPITAL_COMMUNITY)
Admission: EM | Admit: 2014-11-30 | Discharge: 2014-12-01 | DRG: 202 | Disposition: A | Discharge status: Home / Self Care | Payer: No Typology Code available for payment source | Attending: Internal Medicine | Admitting: Internal Medicine

## 2014-11-30 DIAGNOSIS — E669 Obesity, unspecified: Secondary | ICD-10-CM | POA: Diagnosis present

## 2014-11-30 DIAGNOSIS — Z833 Family history of diabetes mellitus: Secondary | ICD-10-CM

## 2014-11-30 DIAGNOSIS — R0602 Shortness of breath: Secondary | ICD-10-CM | POA: Diagnosis not present

## 2014-11-30 DIAGNOSIS — Z87891 Personal history of nicotine dependence: Secondary | ICD-10-CM

## 2014-11-30 DIAGNOSIS — R0603 Acute respiratory distress: Secondary | ICD-10-CM | POA: Insufficient documentation

## 2014-11-30 DIAGNOSIS — R0902 Hypoxemia: Secondary | ICD-10-CM | POA: Diagnosis present

## 2014-11-30 DIAGNOSIS — J45902 Unspecified asthma with status asthmaticus: Secondary | ICD-10-CM | POA: Diagnosis not present

## 2014-11-30 DIAGNOSIS — Z6841 Body Mass Index (BMI) 40.0 and over, adult: Secondary | ICD-10-CM

## 2014-11-30 DIAGNOSIS — Z7952 Long term (current) use of systemic steroids: Secondary | ICD-10-CM

## 2014-11-30 DIAGNOSIS — J209 Acute bronchitis, unspecified: Secondary | ICD-10-CM

## 2014-11-30 MED ORDER — LEVOFLOXACIN 750 MG PO TABS
750.0000 mg | ORAL_TABLET | Freq: Once | ORAL | Status: AC
  Administered 2014-11-30: 750 mg via ORAL
  Filled 2014-11-30: qty 1

## 2014-11-30 MED ORDER — ALBUTEROL SULFATE (2.5 MG/3ML) 0.083% IN NEBU
5.0000 mg | INHALATION_SOLUTION | Freq: Once | RESPIRATORY_TRACT | Status: AC
  Administered 2014-11-30: 5 mg via RESPIRATORY_TRACT
  Filled 2014-11-30: qty 6

## 2014-11-30 MED ORDER — PREDNISONE 20 MG PO TABS
60.0000 mg | ORAL_TABLET | Freq: Once | ORAL | Status: AC
  Administered 2014-11-30: 60 mg via ORAL
  Filled 2014-11-30: qty 3

## 2014-11-30 MED ORDER — ALBUTEROL (5 MG/ML) CONTINUOUS INHALATION SOLN
15.0000 mg | INHALATION_SOLUTION | Freq: Once | RESPIRATORY_TRACT | Status: AC
  Administered 2014-11-30: 15 mg via RESPIRATORY_TRACT
  Filled 2014-11-30: qty 20

## 2014-11-30 MED ORDER — IPRATROPIUM-ALBUTEROL 0.5-2.5 (3) MG/3ML IN SOLN
3.0000 mL | Freq: Once | RESPIRATORY_TRACT | Status: AC
  Administered 2014-11-30: 3 mL via RESPIRATORY_TRACT
  Filled 2014-11-30: qty 3

## 2014-11-30 NOTE — ED Notes (Addendum)
patient c/o SOB with activity. Patient states she has a nebulizer and inhaler at home but they are not helping, c/o wheezing. Patient seen @ 2weeks ago at [facility], same complaint.

## 2014-11-30 NOTE — Progress Notes (Signed)
Pt too short of breath to perform peak flow before CAT.  RT to monitor and assess as needed.

## 2014-11-30 NOTE — ED Provider Notes (Signed)
CSN: [id number]     Arrival date & time 11/30/14  1944 History   First MD Initiated Contact with Patient 11/30/14 2210     Chief Complaint  Patient presents with  . Shortness of Breath     (Consider location/radiation/quality/duration/timing/severity/associated sxs/prior Treatment) The history is provided by the patient.     April Ellis is a 39 y.o. female who complains of shortness of breath and wheezing for 2 days. She had a similar episode was ago and was treated that time with a Z-Pak, and prednisone. She uses albuterol nebulizer, regularly. She also takes her Advair inhaler. She denies chest pain, weakness or dizziness. She's not had any lower extremity swelling or pain. She denies fever, chills, cough, nausea or vomiting. There are no other known modifying factors.   Past Medical History  Diagnosis Date  . Anemia   . Bronchitis   . Asthma   . Obesity   . COPD (chronic obstructive pulmonary disease)    Past Surgical History  Procedure Laterality Date  . No past surgeries    . Dental surgery      2 teeth pulled, 11/22/14   Family History  Problem Relation Age of Onset  . Diabetes Mother   . Diabetes Sister   . Hypertension Sister   . Hypertension Sister    History  Substance Use Topics  . Smoking status: Former Smoker -- 0.25 packs/day for 15 years    Types: Cigarettes    Quit date: 09/16/2014  . Smokeless tobacco: Never Used  . Alcohol Use: No   OB History    No data available     Review of Systems  All other systems reviewed and are negative.     Allergies  Eggs or egg-derived products  Home Medications   Prior to Admission medications   Medication Sig Start Date End Date Taking? Authorizing Provider  albuterol (PROVENTIL HFA;VENTOLIN HFA) 108 (90 BASE) MCG/ACT inhaler Inhale 2 puffs into the lungs every 4 (four) hours as needed for wheezing or shortness of breath. 09/02/14  Yes Shawnee Knapp, MD  albuterol (PROVENTIL) (2.5 MG/3ML) 0.083% nebulizer  solution Take 3 mLs (2.5 mg total) by nebulization every 4 (four) hours as needed for wheezing or shortness of breath. 11/18/14 11/19/15 Yes Shawnee Knapp, MD  Fluticasone-Salmeterol (ADVAIR DISKUS) 250-50 MCG/DOSE AEPB Inhale 1 puff into the lungs 2 (two) times daily. 11/18/14  Yes Shawnee Knapp, MD  azithromycin (ZITHROMAX) 500 MG tablet Take 1 tablet (500 mg total) by mouth daily. Patient not taking: Reported on 11/30/2014 11/18/14   Shawnee Knapp, MD  predniSONE (DELTASONE) 20 MG tablet 3 tabs po qd x 2d, then 2 tabs po qd x 3d Patient not taking: Reported on 11/30/2014 11/18/14   Shawnee Knapp, MD   BP 137/50 mmHg  Pulse 124  Temp(Src) 97.8 F (36.6 C) (Oral)  Resp 19  SpO2 95%  LMP 11/01/2014 (Approximate) Physical Exam  Constitutional: She is oriented to person, place, and time. She appears well-developed.  Morbidly obese  HENT:  Head: Normocephalic and atraumatic.  Right Ear: External ear normal.  Left Ear: External ear normal.  Eyes: Conjunctivae and EOM are normal. Pupils are equal, round, and reactive to light.  Neck: Normal range of motion and phonation normal. Neck supple.  Cardiovascular: Normal rate, regular rhythm and normal heart sounds.   Pulmonary/Chest: Effort normal. She exhibits no bony tenderness.  Decreased air movement bilaterally with generalized expiratory wheezing.  Abdominal: Soft. There is  no tenderness.  Musculoskeletal: Normal range of motion. She exhibits no tenderness.  Neurological: She is alert and oriented to person, place, and time. No cranial nerve deficit or sensory deficit. She exhibits normal muscle tone. Coordination normal.  Skin: Skin is warm, dry and intact.  Psychiatric: She has a normal mood and affect. Her behavior is normal. Judgment and thought content normal.  Nursing note and vitals reviewed.   ED Course  Procedures (including critical care time)   Medications  sodium chloride 0.9 % bolus 1,000 mL (not administered)  ipratropium-albuterol  (DUONEB) 0.5-2.5 (3) MG/3ML nebulizer solution 3 mL (3 mLs Nebulization Given 11/30/14 2042)  albuterol (PROVENTIL) (2.5 MG/3ML) 0.083% nebulizer solution 5 mg (5 mg Nebulization Given 11/30/14 2217)  predniSONE (DELTASONE) tablet 60 mg (60 mg Oral Given 11/30/14 2224)  levofloxacin (LEVAQUIN) tablet 750 mg (750 mg Oral Given 11/30/14 2224)  albuterol (PROVENTIL,VENTOLIN) solution continuous neb (15 mg Nebulization Given 11/30/14 2228)    Patient Vitals for the past 24 hrs:  BP Temp Temp src Pulse Resp SpO2  12/01/14 0004 (!) 137/50 mmHg - - (!) 124 19 95 %  12/01/14 0004 - - - - - (!) 89 %  11/30/14 2228 - - - - - (!) 86 %  11/30/14 2227 168/71 mmHg 97.8 F (36.6 C) Oral 108 16 (!) 85 %  11/30/14 2042 - - - - - 100 %  11/30/14 2002 159/89 mmHg 98.2 F (36.8 C) Oral 111 20 92 %    12:21 AM Reevaluation with update and discussion. After initial assessment and treatment, an updated evaluation reveals she feels somewhat better, but has required nasal cannula oxygen for oxygen desaturation to 89% on room air. Lungs have improved air movement, still with generalized wheezing. Labs and IV fluids have been ordered, pending observation here in the emergency department. Milledge Gerding L   CRITICAL CARE Performed by: Daleen Bo L Total critical care time: 35 minutes Critical care time was exclusive of separately billable procedures and treating other patients. Critical care was necessary to treat or prevent imminent or life-threatening deterioration. Critical care was time spent personally by me on the following activities: development of treatment plan with patient and/or surrogate as well as nursing, discussions with consultants, evaluation of patient's response to treatment, examination of patient, obtaining history from patient or surrogate, ordering and performing treatments and interventions, ordering and review of laboratory studies, ordering and review of radiographic studies, pulse oximetry and  re-evaluation of patient's condition.  Labs Review Labs Reviewed  BASIC METABOLIC PANEL  CBC WITH DIFFERENTIAL/PLATELET    Imaging Review No results found.   EKG Interpretation None      MDM   Final diagnoses:  Acute bronchitis, unspecified organism  Status asthmaticus, unspecified asthma severity  Respiratory distress    Exacerbation asthma, complicated by hypoxia. Sx are recurrent, recently with same. Dout PNE or severe respiratory distress.  Nursing Notes Reviewed/ Care Coordinated Applicable Imaging Reviewed Interpretation of Laboratory Data incorporated into ED treatment  Plan: Care to Dr. Claudine Mouton to reevaluate after IVF and repeat neb.    Daleen Bo, MD 12/02/14 [phone]

## 2014-11-30 NOTE — ED Notes (Signed)
RT requested for neb treatment

## 2014-12-01 ENCOUNTER — Encounter (HOSPITAL_COMMUNITY): Payer: Self-pay | Admitting: *Deleted

## 2014-12-01 DIAGNOSIS — Z7952 Long term (current) use of systemic steroids: Secondary | ICD-10-CM | POA: Diagnosis not present

## 2014-12-01 DIAGNOSIS — R0603 Acute respiratory distress: Secondary | ICD-10-CM | POA: Insufficient documentation

## 2014-12-01 DIAGNOSIS — E669 Obesity, unspecified: Secondary | ICD-10-CM | POA: Diagnosis present

## 2014-12-01 DIAGNOSIS — Z87891 Personal history of nicotine dependence: Secondary | ICD-10-CM | POA: Diagnosis not present

## 2014-12-01 DIAGNOSIS — J4552 Severe persistent asthma with status asthmaticus: Secondary | ICD-10-CM

## 2014-12-01 DIAGNOSIS — J45902 Unspecified asthma with status asthmaticus: Secondary | ICD-10-CM | POA: Diagnosis present

## 2014-12-01 DIAGNOSIS — R0602 Shortness of breath: Secondary | ICD-10-CM | POA: Diagnosis present

## 2014-12-01 DIAGNOSIS — Z833 Family history of diabetes mellitus: Secondary | ICD-10-CM | POA: Diagnosis not present

## 2014-12-01 DIAGNOSIS — J4542 Moderate persistent asthma with status asthmaticus: Secondary | ICD-10-CM

## 2014-12-01 DIAGNOSIS — Z6841 Body Mass Index (BMI) 40.0 and over, adult: Secondary | ICD-10-CM | POA: Diagnosis not present

## 2014-12-01 DIAGNOSIS — R0600 Dyspnea, unspecified: Secondary | ICD-10-CM

## 2014-12-01 DIAGNOSIS — R0902 Hypoxemia: Secondary | ICD-10-CM | POA: Diagnosis present

## 2014-12-01 LAB — BASIC METABOLIC PANEL
ANION GAP: 9 (ref 5–15)
Anion gap: 7 (ref 5–15)
BUN: 9 mg/dL (ref 6–23)
BUN: 8 mg/dL (ref 6–23)
CALCIUM: 8.6 mg/dL (ref 8.4–10.5)
CO2: 27 mmol/L (ref 19–32)
CO2: 27 mmol/L (ref 19–32)
Calcium: 8.3 mg/dL — ABNORMAL LOW (ref 8.4–10.5)
Chloride: 100 mmol/L (ref 96–112)
Chloride: 102 mmol/L (ref 96–112)
Creatinine, Ser: 0.65 mg/dL (ref 0.50–1.10)
Creatinine, Ser: 0.76 mg/dL (ref 0.50–1.10)
GFR calc Af Amer: >90 mL/min (ref >90)
GFR calc non Af Amer: >90 mL/min (ref >90)
Glucose, Bld: 152 mg/dL — ABNORMAL HIGH (ref 70–99)
Glucose, Bld: 195 mg/dL — ABNORMAL HIGH (ref 70–99)
Potassium: 3.3 mmol/L — ABNORMAL LOW (ref 3.5–5.1)
Potassium: 4.3 mmol/L (ref 3.5–5.1)
SODIUM: 138 mmol/L (ref 135–145)
Sodium: 134 mmol/L — ABNORMAL LOW (ref 135–145)

## 2014-12-01 LAB — CBC
HEMATOCRIT: 36.8 % (ref 36.0–46.0)
Hemoglobin: 10.6 g/dL — ABNORMAL LOW (ref 12.0–15.0)
MCH: 22.6 pg — AB (ref 26.0–34.0)
MCHC: 28.8 g/dL — AB (ref 30.0–36.0)
MCV: 78.6 fL (ref 78.0–100.0)
Platelets: 257 10*3/uL (ref 150–400)
RBC: 4.68 MIL/uL (ref 3.87–5.11)
RDW: 18 % — ABNORMAL HIGH (ref 11.5–15.5)
WBC: 14 10*3/uL — ABNORMAL HIGH (ref 4.0–10.5)

## 2014-12-01 LAB — CBC WITH DIFFERENTIAL/PLATELET
BASOS ABS: 0.1 10*3/uL (ref 0.0–0.1)
BASOS PCT: 0 % (ref 0–1)
Eosinophils Absolute: 0.9 10*3/uL — ABNORMAL HIGH (ref 0.0–0.7)
Eosinophils Relative: 5 % (ref 0–5)
HCT: 36.2 % (ref 36.0–46.0)
Hemoglobin: 10.5 g/dL — ABNORMAL LOW (ref 12.0–15.0)
LYMPHS ABS: 1.6 10*3/uL (ref 0.7–4.0)
Lymphocytes Relative: 10 % — ABNORMAL LOW (ref 12–46)
MCH: 22.8 pg — ABNORMAL LOW (ref 26.0–34.0)
MCHC: 29 g/dL — AB (ref 30.0–36.0)
MCV: 78.7 fL (ref 78.0–100.0)
Monocytes Absolute: 0.6 10*3/uL (ref 0.1–1.0)
Monocytes Relative: 4 % (ref 3–12)
NEUTROS PCT: 81 % — AB (ref 43–77)
Neutro Abs: 12.9 10*3/uL — ABNORMAL HIGH (ref 1.7–7.7)
PLATELETS: 261 10*3/uL (ref 150–400)
RBC: 4.6 MIL/uL (ref 3.87–5.11)
RDW: 17.8 % — AB (ref 11.5–15.5)
WBC: 15.9 10*3/uL — ABNORMAL HIGH (ref 4.0–10.5)

## 2014-12-01 MED ORDER — ONDANSETRON HCL 4 MG PO TABS: 4.0000 mg | ORAL_TABLET | Freq: Four times a day (QID) | ORAL | Status: DC | PRN

## 2014-12-01 MED ORDER — SODIUM CHLORIDE 0.9 % IJ SOLN: 3.0000 mL | INTRAMUSCULAR | Status: DC | PRN

## 2014-12-01 MED ORDER — SODIUM CHLORIDE 0.9 % IV BOLUS (SEPSIS)
1000.0000 mL | Freq: Once | INTRAVENOUS | Status: AC
  Administered 2014-12-01: 1000 mL via INTRAVENOUS

## 2014-12-01 MED ORDER — ONDANSETRON HCL 4 MG/2ML IJ SOLN: 4.0000 mg | Freq: Four times a day (QID) | INTRAMUSCULAR | Status: DC | PRN

## 2014-12-01 MED ORDER — HYDROMORPHONE HCL 1 MG/ML IJ SOLN: 0.5000 mg | INTRAMUSCULAR | Status: DC | PRN

## 2014-12-01 MED ORDER — SODIUM CHLORIDE 0.9 % IJ SOLN
3.0000 mL | Freq: Two times a day (BID) | INTRAMUSCULAR | Status: DC
  Administered 2014-12-01: 3 mL via INTRAVENOUS

## 2014-12-01 MED ORDER — POTASSIUM CHLORIDE CRYS ER 20 MEQ PO TBCR
40.0000 meq | EXTENDED_RELEASE_TABLET | Freq: Once | ORAL | Status: AC
Start: 2014-12-01 — End: 2014-12-01
  Administered 2014-12-01: 40 meq via ORAL
  Filled 2014-12-01: qty 2

## 2014-12-01 MED ORDER — ACETAMINOPHEN 650 MG RE SUPP: 650.0000 mg | Freq: Four times a day (QID) | RECTAL | Status: DC | PRN

## 2014-12-01 MED ORDER — OXYCODONE HCL 5 MG PO TABS: 5.0000 mg | ORAL_TABLET | ORAL | Status: DC | PRN

## 2014-12-01 MED ORDER — ALUM & MAG HYDROXIDE-SIMETH 200-200-20 MG/5ML PO SUSP: 30.0000 mL | Freq: Four times a day (QID) | ORAL | Status: DC | PRN

## 2014-12-01 MED ORDER — IPRATROPIUM-ALBUTEROL 0.5-2.5 (3) MG/3ML IN SOLN
3.0000 mL | RESPIRATORY_TRACT | Status: DC
  Administered 2014-12-01 (×3): 3 mL via RESPIRATORY_TRACT
  Filled 2014-12-01 (×3): qty 3

## 2014-12-01 MED ORDER — MAGNESIUM SULFATE 2 GM/50ML IV SOLN
2.0000 g | Freq: Once | INTRAVENOUS | Status: AC
  Administered 2014-12-01: 2 g via INTRAVENOUS
  Filled 2014-12-01: qty 50

## 2014-12-01 MED ORDER — ENOXAPARIN SODIUM 40 MG/0.4ML ~~LOC~~ SOLN
40.0000 mg | SUBCUTANEOUS | Status: DC
  Administered 2014-12-01: 40 mg via SUBCUTANEOUS
  Filled 2014-12-01: qty 0.4

## 2014-12-01 MED ORDER — SODIUM CHLORIDE 0.9 % IV SOLN: 250.0000 mL | INTRAVENOUS | Status: DC | PRN

## 2014-12-01 MED ORDER — METHYLPREDNISOLONE SODIUM SUCC 125 MG IJ SOLR
125.0000 mg | Freq: Once | INTRAMUSCULAR | Status: AC
  Administered 2014-12-01: 125 mg via INTRAVENOUS
  Filled 2014-12-01: qty 2

## 2014-12-01 MED ORDER — PREDNISONE 10 MG PO TABS: ORAL_TABLET | ORAL | Status: DC

## 2014-12-01 MED ORDER — ACETAMINOPHEN 325 MG PO TABS: 650.0000 mg | ORAL_TABLET | Freq: Four times a day (QID) | ORAL | Status: DC | PRN

## 2014-12-01 NOTE — Discharge Summary (Signed)
Physician Discharge Summary  April Ellis GMW:[id number] DOB: [birth year] DOA: 11/30/2014  PCP: Delman Cheadle, MD  Admit date: 11/30/2014 Discharge date: 12/01/2014  Time spent: 30 minutes  Recommendations for Outpatient Follow-up:  1. Follow up with PCP in 2 weeks, follow up on Asthma.  Discharge Diagnoses:  Principal Problem:   Status asthmaticus Active Problems:   Respiratory distress   Discharge Condition: stable  Diet recommendation: Carb  Filed Weights   12/01/14 0254  Weight: 171.3 kg (377 lb 10.4 oz)    History of present illness:  39 y.o. female with a history of Asthma , who presents tot he Ed with worsening SOB, and wheezing x 24 hours without relief from her home medications. She denies any fevers or chills. She was administered a continuous nebulizer treatment, 60 mg Prednisone, and IV magnesium and began to improve.  Hospital Course:  Status asthmaticus: - Started on IV Steroid with improvement in exercise tolerance, O2 remain > 90% with ambulation. - cont nebs and steroids as an outpatient   Procedures:  CXR  Consultations:  none  Discharge Exam: Filed Vitals:   12/01/14 0419  BP: 150/73  Pulse: 97  Temp: 98.2 F (36.8 C)  Resp: 18    General: A&O x3 Cardiovascular: RRR Respiratory: good air movement CTA B/L  Discharge Instructions   Discharge Instructions    Diet - low sodium heart healthy    Complete by:  As directed      Increase activity slowly    Complete by:  As directed           Current Discharge Medication List    CONTINUE these medications which have CHANGED   Details  predniSONE (DELTASONE) 10 MG tablet Takes 6 tablets for 1 days, then 5 tablets for 1 days, then 4 tablets for 1 days, then 3 tablets for 1 days, then 2 tabs for 1 days, then 1 tab for 1 days, and then stop. Qty: 42 tablet, Refills: 0      CONTINUE these medications which have NOT CHANGED   Details  albuterol (PROVENTIL  HFA;VENTOLIN HFA) 108 (90 BASE) MCG/ACT inhaler Inhale 2 puffs into the lungs every 4 (four) hours as needed for wheezing or shortness of breath. Qty: 1 Inhaler, Refills: 4   Associated Diagnoses: SOB (shortness of breath); Asthma with acute exacerbation, moderate persistent    albuterol (PROVENTIL) (2.5 MG/3ML) 0.083% nebulizer solution Take 3 mLs (2.5 mg total) by nebulization every 4 (four) hours as needed for wheezing or shortness of breath. Qty: 150 mL, Refills: 3   Associated Diagnoses: Asthma with acute exacerbation, moderate persistent    Fluticasone-Salmeterol (ADVAIR DISKUS) 250-50 MCG/DOSE AEPB Inhale 1 puff into the lungs 2 (two) times daily. Qty: 60 each, Refills: 3      STOP taking these medications     azithromycin (ZITHROMAX) 500 MG tablet        Allergies  Allergen Reactions  . Eggs Or Egg-Derived Products Nausea And Vomiting      The results of significant diagnostics from this hospitalization (including imaging, microbiology, ancillary and laboratory) are listed below for reference.    Significant Diagnostic Studies: Dg Chest 2 View  12/01/2014   CLINICAL DATA:  Initial evaluation for acute shortness of breath, congestion, cough.  EXAM: CHEST  2 VIEW  COMPARISON:  Prior radiograph from 09/30/2014  FINDINGS: Mild cardiomegaly is stable. Mediastinal silhouette within normal limits.  Lungs are normally inflated. No focal infiltrate, pulmonary edema, or pleural effusion. There is no pneumothorax.  No acute osseus abnormality.  IMPRESSION: 1. No active cardiopulmonary disease. 2. Cardiomegaly, stable.   Electronically Signed   By: Jeannine Boga M.D.   On: 12/01/2014 00:36    Microbiology: No results found for this or any previous visit (from the past 240 hour(s)).   Labs: Basic Metabolic Panel:  Recent Labs Lab 12/01/14 0037 12/01/14 0536  NA 134* 138  K 3.3* 4.3  CL 100 102  CO2 27 27  GLUCOSE 152* 195*  BUN 9 8  CREATININE 0.65 0.76  CALCIUM 8.3*  8.6   Liver Function Tests: No results for input(s): AST, ALT, ALKPHOS, BILITOT, PROT, ALBUMIN in the last 168 hours. No results for input(s): LIPASE, AMYLASE in the last 168 hours. No results for input(s): AMMONIA in the last 168 hours. CBC:  Recent Labs Lab 12/01/14 0037 12/01/14 0536  WBC 15.9* 14.0*  NEUTROABS 12.9*  --   HGB 10.5* 10.6*  HCT 36.2 36.8  MCV 78.7 78.6  PLT 261 257   Cardiac Enzymes: No results for input(s): CKTOTAL, CKMB, CKMBINDEX, TROPONINI in the last 168 hours. BNP: BNP (last 3 results) No results for input(s): BNP in the last 8760 hours.  ProBNP (last 3 results) No results for input(s): PROBNP in the last 8760 hours.  CBG: No results for input(s): GLUCAP in the last 168 hours.     Signed:  Charlynne Cousins  [facility] 12/01/2014, 10:32 AM

## 2014-12-01 NOTE — Progress Notes (Signed)
12/01/14  1200  Reviewed discharge instructions with patient. Patient verbalized understanding of discharge instructions. Copy of discharge instructions and prescription given to patient. Case Management spoke with patient about getting advair inhaler cheaper. Pt states she has an $75.00 copay and needs assistance with medicine.

## 2014-12-01 NOTE — Progress Notes (Signed)
CARE MANAGEMENT NOTE 12/01/2014  Patient:  April Ellis, April Ellis   Account Number:  [id number]  Date Initiated:  12/01/2014  Documentation initiated by:  [facility]  Subjective/Objective Assessment:   Status asthmaticus     Action/Plan:   Anticipated DC Date:  12/01/2014   Anticipated DC Plan:  [place]  CM consult  Medication Assistance      Choice offered to / List presented to:             Status of service:  Completed, signed off Medicare Important Message given?   (If response is "NO", the following Medicare IM given date fields will be blank) Date Medicare IM given:   Medicare IM given by:   Date Additional Medicare IM given:   Additional Medicare IM given by:    Discharge Disposition:  HOME/SELF CARE  Per UR Regulation:    If discussed at Long Length of Stay Meetings, dates discussed:    Comments:  12/01/2014 1200 NCM spoke to pt and provided her with coupon for Advair for $10 copay. Pt states she attempted to use in the past but they would not accept coupon. States she will try to use coupon at pharmacy. States her copay run $75 for medication. She does work and able to pay when she gets paid. Jonnie Finner RN CCM Case Mgmt phone [phone]

## 2014-12-01 NOTE — ED Provider Notes (Signed)
Patient signed out to me as pending IV fluids and repeat albuterol treatment. She continues to be hypoxic, 88% on room air. Patient requires admission at this point for asthma exacerbation and respiratory depression. Magnesium 2 g was ordered in emergency department. Patient continues to wheeze on exam.   Everlene Balls, MD 12/01/14 0126

## 2014-12-01 NOTE — H&P (Signed)
[facility] Admission History and Physical       April Ellis [id number]:[id number] DOB: Dec 04, 1975 DOA: 11/30/2014  Referring physician: EDP PCP: Delman Cheadle, MD  Specialists:   Chief Complaint: SOB and Wheezing  HPI: April Ellis is a 39 y.o. female with a history of Asthma , who presents tot he Ed with worsening SOB, and wheezing x 24 hours without relief from her home medications.  She denies any fevers or chills.  She was administered a continuous nebulizer treatment, 60 mg Prednisone, and IV magnesium and began to improve.      Review of Systems:  Constitutional: No Weight Loss, No Weight Gain, Night Sweats, Fevers, Chills, Dizziness, Light Headedness, Fatigue, or Generalized Weakness HEENT: No Headaches, Difficulty Swallowing,Tooth/Dental Problems,Sore Throat,  No Sneezing, Rhinitis, Ear Ache, Nasal Congestion, or Post Nasal Drip,  Cardio-vascular:  No Chest pain, Orthopnea, PND, Edema in Lower Extremities, Anasarca, Dizziness, Palpitations  Resp: +Dyspnea, No DOE, No Productive Cough, No Non-Productive Cough, No Hemoptysis,  +Wheezing.    GI: No Heartburn, Indigestion, Abdominal Pain, Nausea, Vomiting, Diarrhea, Constipation, Hematemesis, Hematochezia, Melena, Change in Bowel Habits,  Loss of Appetite  GU: No Dysuria, No Change in Color of Urine, No Urgency or Urinary Frequency, No Flank pain.  Musculoskeletal: No Joint Pain or Swelling, No Decreased Range of Motion, No Back Pain.  Neurologic: No Syncope, No Seizures, Muscle Weakness, Paresthesia, Vision Disturbance or Loss, No Diplopia, No Vertigo, No Difficulty Walking,  Skin: No Rash or Lesions. Psych: No Change in Mood or Affect, No Depression or Anxiety, No Memory loss, No Confusion, or Hallucinations   Past Medical History  Diagnosis Date  . Anemia   . Bronchitis   . Asthma   . Obesity   . COPD (chronic obstructive pulmonary disease)      Past Surgical History  Procedure Laterality Date  . No past surgeries     . Dental surgery      2 teeth pulled, 11/22/14      Prior to Admission medications   Medication Sig Start Date End Date Taking? Authorizing Provider  albuterol (PROVENTIL HFA;VENTOLIN HFA) 108 (90 BASE) MCG/ACT inhaler Inhale 2 puffs into the lungs every 4 (four) hours as needed for wheezing or shortness of breath. 09/02/14  Yes Shawnee Knapp, MD  albuterol (PROVENTIL) (2.5 MG/3ML) 0.083% nebulizer solution Take 3 mLs (2.5 mg total) by nebulization every 4 (four) hours as needed for wheezing or shortness of breath. 11/18/14 11/19/15 Yes Shawnee Knapp, MD  Fluticasone-Salmeterol (ADVAIR DISKUS) 250-50 MCG/DOSE AEPB Inhale 1 puff into the lungs 2 (two) times daily. 11/18/14  Yes Shawnee Knapp, MD  azithromycin (ZITHROMAX) 500 MG tablet Take 1 tablet (500 mg total) by mouth daily. Patient not taking: Reported on 11/30/2014 11/18/14   Shawnee Knapp, MD  predniSONE (DELTASONE) 20 MG tablet 3 tabs po qd x 2d, then 2 tabs po qd x 3d Patient not taking: Reported on 11/30/2014 11/18/14   Shawnee Knapp, MD     Allergies  Allergen Reactions  . Eggs Or Egg-Derived Products Nausea And Vomiting    Social History:  reports that she quit smoking about 2 months ago. Her smoking use included Cigarettes. She has a 3.75 pack-year smoking history. She has never used smokeless tobacco. She reports that she does not drink alcohol or use illicit drugs.    Family History  Problem Relation Age of Onset  . Diabetes Mother   . Diabetes Sister   . Hypertension Sister   .  Hypertension Sister        Physical Exam:  GEN:  Pleasant Obese 39 y.o. African American female examined and in no acute distress; cooperative with exam Filed Vitals:   12/01/14 0004 12/01/14 0004 12/01/14 0154 12/01/14 0254  BP:  137/50 123/51 152/77  Pulse:  124 106 106  Temp:    98.1 F (36.7 C)  TempSrc:    Oral  Resp:  19 16 20   SpO2: 89% 95% 94% 94%   Blood pressure 152/77, pulse 106, temperature 98.1 F (36.7 C), temperature source Oral, resp.  rate 20, last menstrual period 11/01/2014, SpO2 94 %. PSYCH: She is alert and oriented x4; does not appear anxious does not appear depressed; affect is normal HEENT: Normocephalic and Atraumatic, Mucous membranes pink; PERRLA; EOM intact; Fundi:  Benign;  No scleral icterus, Nares: Patent, Oropharynx: Clear, Fair Dentition,    Neck:  FROM, No Cervical Lymphadenopathy nor Thyromegaly or Carotid Bruit; No JVD; Breasts:: Not examined CHEST WALL: No tenderness CHEST: Decreased Breath Sound,   Diffuses Expiratory Wheezes,    HEART: Regular rate and rhythm; no murmurs rubs or gallops BACK: No kyphosis or scoliosis; No CVA tenderness ABDOMEN: Positive Bowel Sounds,  Obese, Soft Non-Tender, No Rebound or Guarding; No Masses, No Organomegaly. Rectal Exam: Not done EXTREMITIES: No Cyanosis, Clubbing, 2+ BLE Edema; No Ulcerations. Genitalia: not examined PULSES: 2+ and symmetric SKIN: Normal hydration no rash or ulceration CNS:  Alert and Oriented x 4, No Focal Deficits Vascular: pulses palpable throughout    Labs on Admission:  Basic Metabolic Panel:  Recent Labs Lab 12/01/14 0037  NA 134*  K 3.3*  CL 100  CO2 27  GLUCOSE 152*  BUN 9  CREATININE 0.65  CALCIUM 8.3*   Liver Function Tests: No results for input(s): AST, ALT, ALKPHOS, BILITOT, PROT, ALBUMIN in the last 168 hours. No results for input(s): LIPASE, AMYLASE in the last 168 hours. No results for input(s): AMMONIA in the last 168 hours. CBC:  Recent Labs Lab 12/01/14 0037  WBC 15.9*  NEUTROABS 12.9*  HGB 10.5*  HCT 36.2  MCV 78.7  PLT 261   Cardiac Enzymes: No results for input(s): CKTOTAL, CKMB, CKMBINDEX, TROPONINI in the last 168 hours.  BNP (last 3 results) No results for input(s): BNP in the last 8760 hours.  ProBNP (last 3 results) No results for input(s): PROBNP in the last 8760 hours.  CBG: No results for input(s): GLUCAP in the last 168 hours.  Radiological Exams on Admission: Dg Chest 2  View  12/01/2014   CLINICAL DATA:  Initial evaluation for acute shortness of breath, congestion, cough.  EXAM: CHEST  2 VIEW  COMPARISON:  Prior radiograph from 09/30/2014  FINDINGS: Mild cardiomegaly is stable. Mediastinal silhouette within normal limits.  Lungs are normally inflated. No focal infiltrate, pulmonary edema, or pleural effusion. There is no pneumothorax.  No acute osseus abnormality.  IMPRESSION: 1. No active cardiopulmonary disease. 2. Cardiomegaly, stable.   Electronically Signed   By: Jeannine Boga M.D.   On: 12/01/2014 00:36     EKG: Independently reviewed.    Assessment/Plan:   39 y.o. female with  Principal Problem:     1.    Status asthmaticus   IV Steroid Taper   DuoNebs   O2 PRN   Given IV MAgnesium x 1 in ED       2.    DVT Prophylaxis    Lovenox           Code Status:  FULL CODE    Family Communication:   Family at Bedside       Disposition Plan:    Inpatient Status        Time spent:  [facility] Pager [phone]   If [place] Please Contact the Day Rounding Team MD for [facility]  If 7PM-7AM, Please Contact Night-Floor Coverage  [email or web address] Password [email or web address] 12/01/2014, 2:58 AM     ADDENDUM:   Patient was seen and examined on 12/01/2014

## 2015-01-01 ENCOUNTER — Other Ambulatory Visit: Payer: Self-pay

## 2015-01-01 DIAGNOSIS — R0602 Shortness of breath: Secondary | ICD-10-CM

## 2015-01-01 DIAGNOSIS — J4541 Moderate persistent asthma with (acute) exacerbation: Secondary | ICD-10-CM

## 2015-01-01 MED ORDER — ALBUTEROL SULFATE HFA 108 (90 BASE) MCG/ACT IN AERS: 2.0000 | INHALATION_SPRAY | RESPIRATORY_TRACT | Status: DC | PRN

## 2015-04-28 ENCOUNTER — Encounter (HOSPITAL_COMMUNITY): Payer: Self-pay | Admitting: Emergency Medicine

## 2015-04-28 ENCOUNTER — Inpatient Hospital Stay (HOSPITAL_COMMUNITY)
Admission: EM | Admit: 2015-04-28 | Discharge: 2015-04-30 | DRG: 202 | Disposition: A | Discharge status: Home / Self Care | Payer: No Typology Code available for payment source | Attending: Internal Medicine | Admitting: Internal Medicine

## 2015-04-28 ENCOUNTER — Emergency Department (HOSPITAL_COMMUNITY): Payer: No Typology Code available for payment source

## 2015-04-28 DIAGNOSIS — E669 Obesity, unspecified: Secondary | ICD-10-CM | POA: Diagnosis present

## 2015-04-28 DIAGNOSIS — J9601 Acute respiratory failure with hypoxia: Secondary | ICD-10-CM | POA: Diagnosis present

## 2015-04-28 DIAGNOSIS — R0602 Shortness of breath: Secondary | ICD-10-CM

## 2015-04-28 DIAGNOSIS — Z833 Family history of diabetes mellitus: Secondary | ICD-10-CM

## 2015-04-28 DIAGNOSIS — J45901 Unspecified asthma with (acute) exacerbation: Secondary | ICD-10-CM | POA: Diagnosis present

## 2015-04-28 DIAGNOSIS — Z8249 Family history of ischemic heart disease and other diseases of the circulatory system: Secondary | ICD-10-CM

## 2015-04-28 DIAGNOSIS — J4541 Moderate persistent asthma with (acute) exacerbation: Secondary | ICD-10-CM | POA: Diagnosis present

## 2015-04-28 DIAGNOSIS — Z87891 Personal history of nicotine dependence: Secondary | ICD-10-CM

## 2015-04-28 DIAGNOSIS — D72829 Elevated white blood cell count, unspecified: Secondary | ICD-10-CM | POA: Diagnosis present

## 2015-04-28 LAB — BASIC METABOLIC PANEL
Anion gap: 6 (ref 5–15)
BUN: 8 mg/dL (ref 6–20)
CALCIUM: 8.1 mg/dL — AB (ref 8.9–10.3)
CO2: 25 mmol/L (ref 22–32)
Chloride: 105 mmol/L (ref 101–111)
Creatinine, Ser: 0.74 mg/dL (ref 0.44–1.00)
GFR calc non Af Amer: >60 mL/min (ref >60)
Glucose, Bld: 168 mg/dL — ABNORMAL HIGH (ref 65–99)
Potassium: 3.7 mmol/L (ref 3.5–5.1)
Sodium: 136 mmol/L (ref 135–145)

## 2015-04-28 LAB — CBC WITH DIFFERENTIAL/PLATELET
BASOS ABS: 0 10*3/uL (ref 0.0–0.1)
Basophils Relative: 0 % (ref 0–1)
EOS ABS: 0 10*3/uL (ref 0.0–0.7)
EOS PCT: 0 % (ref 0–5)
HCT: 38.4 % (ref 36.0–46.0)
Hemoglobin: 11.4 g/dL — ABNORMAL LOW (ref 12.0–15.0)
Lymphocytes Relative: 3 % — ABNORMAL LOW (ref 12–46)
Lymphs Abs: 0.4 10*3/uL — ABNORMAL LOW (ref 0.7–4.0)
MCH: 23.8 pg — ABNORMAL LOW (ref 26.0–34.0)
MCHC: 29.7 g/dL — ABNORMAL LOW (ref 30.0–36.0)
MCV: 80.2 fL (ref 78.0–100.0)
MONOS PCT: 1 % — AB (ref 3–12)
Monocytes Absolute: 0.1 10*3/uL (ref 0.1–1.0)
NEUTROS ABS: 12.4 10*3/uL — AB (ref 1.7–7.7)
NEUTROS PCT: 96 % — AB (ref 43–77)
Platelets: 264 10*3/uL (ref 150–400)
RBC: 4.79 MIL/uL (ref 3.87–5.11)
RDW: 16.8 % — ABNORMAL HIGH (ref 11.5–15.5)
WBC: 12.9 10*3/uL — AB (ref 4.0–10.5)

## 2015-04-28 LAB — GLUCOSE, CAPILLARY
GLUCOSE-CAPILLARY: 162 mg/dL — AB (ref 65–99)
Glucose-Capillary: 151 mg/dL — ABNORMAL HIGH (ref 65–99)

## 2015-04-28 MED ORDER — ALBUTEROL SULFATE (2.5 MG/3ML) 0.083% IN NEBU: 5.0000 mg | INHALATION_SOLUTION | Freq: Once | RESPIRATORY_TRACT | Status: DC

## 2015-04-28 MED ORDER — ALBUTEROL SULFATE (2.5 MG/3ML) 0.083% IN NEBU
3.0000 mL | INHALATION_SOLUTION | RESPIRATORY_TRACT | Status: DC | PRN
  Administered 2015-04-28 – 2015-04-30 (×4): 3 mL via RESPIRATORY_TRACT
  Filled 2015-04-28 (×4): qty 3

## 2015-04-28 MED ORDER — INSULIN ASPART 100 UNIT/ML ~~LOC~~ SOLN: 0.0000 [IU] | Freq: Three times a day (TID) | SUBCUTANEOUS | Status: DC

## 2015-04-28 MED ORDER — ACETAMINOPHEN 650 MG RE SUPP
650.0000 mg | Freq: Four times a day (QID) | RECTAL | Status: DC | PRN
Start: 2015-04-28 — End: 2015-04-30

## 2015-04-28 MED ORDER — METHYLPREDNISOLONE SODIUM SUCC 125 MG IJ SOLR
125.0000 mg | Freq: Two times a day (BID) | INTRAMUSCULAR | Status: DC
  Administered 2015-04-28: 125 mg via INTRAVENOUS
  Filled 2015-04-28 (×2): qty 2

## 2015-04-28 MED ORDER — INSULIN ASPART 100 UNIT/ML ~~LOC~~ SOLN: 0.0000 [IU] | Freq: Every day | SUBCUTANEOUS | Status: DC

## 2015-04-28 MED ORDER — METHYLPREDNISOLONE SODIUM SUCC 125 MG IJ SOLR
80.0000 mg | Freq: Three times a day (TID) | INTRAMUSCULAR | Status: DC
  Administered 2015-04-28 – 2015-04-29 (×2): 80 mg via INTRAVENOUS
  Filled 2015-04-28 (×5): qty 1.28

## 2015-04-28 MED ORDER — ONDANSETRON HCL 4 MG PO TABS: 4.0000 mg | ORAL_TABLET | Freq: Four times a day (QID) | ORAL | Status: DC | PRN

## 2015-04-28 MED ORDER — MOMETASONE FURO-FORMOTEROL FUM 100-5 MCG/ACT IN AERO
2.0000 | INHALATION_SPRAY | Freq: Two times a day (BID) | RESPIRATORY_TRACT | Status: DC
  Administered 2015-04-28 – 2015-04-30 (×4): 2 via RESPIRATORY_TRACT
  Filled 2015-04-28: qty 8.8

## 2015-04-28 MED ORDER — ALBUTEROL (5 MG/ML) CONTINUOUS INHALATION SOLN
20.0000 mg/h | INHALATION_SOLUTION | Freq: Once | RESPIRATORY_TRACT | Status: AC
  Administered 2015-04-28: 20 mg/h via RESPIRATORY_TRACT
  Filled 2015-04-28: qty 20

## 2015-04-28 MED ORDER — POLYETHYLENE GLYCOL 3350 17 G PO PACK: 17.0000 g | PACK | Freq: Every day | ORAL | Status: DC | PRN

## 2015-04-28 MED ORDER — ONDANSETRON HCL 4 MG/2ML IJ SOLN: 4.0000 mg | Freq: Four times a day (QID) | INTRAMUSCULAR | Status: DC | PRN

## 2015-04-28 MED ORDER — SODIUM CHLORIDE 0.9 % IV SOLN
INTRAVENOUS | Status: DC
  Administered 2015-04-28: 18:00:00 via INTRAVENOUS

## 2015-04-28 MED ORDER — ENOXAPARIN SODIUM 80 MG/0.8ML ~~LOC~~ SOLN
80.0000 mg | SUBCUTANEOUS | Status: DC
  Administered 2015-04-28: 80 mg via SUBCUTANEOUS
  Filled 2015-04-28 (×2): qty 0.8

## 2015-04-28 MED ORDER — MONTELUKAST SODIUM 10 MG PO TABS
10.0000 mg | ORAL_TABLET | Freq: Every day | ORAL | Status: DC
  Administered 2015-04-29 – 2015-04-30 (×2): 10 mg via ORAL
  Filled 2015-04-28 (×2): qty 1

## 2015-04-28 MED ORDER — IPRATROPIUM BROMIDE 0.02 % IN SOLN
0.5000 mg | Freq: Once | RESPIRATORY_TRACT | Status: AC
  Administered 2015-04-28: 0.5 mg via RESPIRATORY_TRACT
  Filled 2015-04-28: qty 2.5

## 2015-04-28 MED ORDER — ACETAMINOPHEN 325 MG PO TABS
650.0000 mg | ORAL_TABLET | Freq: Four times a day (QID) | ORAL | Status: DC | PRN
Start: 2015-04-28 — End: 2015-04-30

## 2015-04-28 MED ORDER — SODIUM CHLORIDE 0.9 % IV BOLUS (SEPSIS)
1000.0000 mL | Freq: Once | INTRAVENOUS | Status: AC
  Administered 2015-04-28: 1000 mL via INTRAVENOUS

## 2015-04-28 NOTE — ED Notes (Signed)
Patient from home with the complaint that she began having shortness of breath when she woke up this morning. She tried a nebulizer treatment at home but it did not help. Patient was 100% on room air when EMS arrived. She was given 125 solumedrol, 0.5 atrovent, and 5 of albuterol by EMS.

## 2015-04-28 NOTE — ED Provider Notes (Signed)
CSN: [id number]     Arrival date & time 04/28/15  1156 History   First MD Initiated Contact with Patient 04/28/15 1205     Chief Complaint  Patient presents with  . Asthma     (Consider location/radiation/quality/duration/timing/severity/associated sxs/prior Treatment) HPI  39 year old female presents with a recurrent asthma exacerbation. She states over the last couple days she's been feeling a she's getting a cold with a mild cough and congestion and then since last night she's been having increased work of breathing. She tried 2 albuterol nebulizers today without significant relief and so she called EMS. EMS gave her 5 mg of albuterol, 0.5 mg Atrovent, and 125 mg Solu-Medrol and she feels somewhat better but is still short of breath. This is not as bad as asthma exacerbations in the past and not as bad as the ones where she has to be admitted to the hospital. She has never been intubated. Denies any pain including no chest pain although her chest is tight.  Past Medical History  Diagnosis Date  . Anemia   . Bronchitis   . Asthma   . Obesity   . COPD (chronic obstructive pulmonary disease)    Past Surgical History  Procedure Laterality Date  . No past surgeries    . Dental surgery      2 teeth pulled, 11/22/14   Family History  Problem Relation Age of Onset  . Diabetes Mother   . Diabetes Sister   . Hypertension Sister   . Hypertension Sister    History  Substance Use Topics  . Smoking status: Former Smoker -- 0.25 packs/day for 15 years    Types: Cigarettes    Quit date: 09/16/2014  . Smokeless tobacco: Never Used  . Alcohol Use: No   OB History    No data available     Review of Systems  Constitutional: Negative for fever.  HENT: Positive for congestion.   Respiratory: Positive for cough, chest tightness, shortness of breath and wheezing.   Cardiovascular: Negative for chest pain.  All other systems reviewed and are negative.     Allergies  Review of  patient's allergies indicates no known allergies.  Home Medications   Prior to Admission medications   Medication Sig Start Date End Date Taking? Authorizing Provider  albuterol (PROVENTIL HFA;VENTOLIN HFA) 108 (90 BASE) MCG/ACT inhaler Inhale 2 puffs into the lungs every 4 (four) hours as needed for wheezing or shortness of breath. 01/01/15  Yes Shawnee Knapp, MD  albuterol (PROVENTIL) (2.5 MG/3ML) 0.083% nebulizer solution Take 3 mLs (2.5 mg total) by nebulization every 4 (four) hours as needed for wheezing or shortness of breath. 11/18/14 11/19/15 Yes Shawnee Knapp, MD  Fluticasone-Salmeterol (ADVAIR DISKUS) 250-50 MCG/DOSE AEPB Inhale 1 puff into the lungs 2 (two) times daily. 11/18/14  Yes Shawnee Knapp, MD  montelukast (SINGULAIR) 10 MG tablet Take 10 mg by mouth daily.  02/27/15  Yes Historical Provider, MD  predniSONE (DELTASONE) 10 MG tablet Takes 6 tablets for 1 days, then 5 tablets for 1 days, then 4 tablets for 1 days, then 3 tablets for 1 days, then 2 tabs for 1 days, then 1 tab for 1 days, and then stop. Patient not taking: Reported on 04/28/2015 12/01/14   Charlynne Cousins, MD   BP 140/60 mmHg  Pulse 127  Temp(Src) 97.7 F (36.5 C) (Oral)  Resp 11  SpO2 100% Physical Exam  Constitutional: She is oriented to person, place, and time. She appears well-developed and  well-nourished.  Morbidly obese  HENT:  Head: Normocephalic and atraumatic.  Right Ear: External ear normal.  Left Ear: External ear normal.  Nose: Nose normal.  Eyes: Right eye exhibits no discharge. Left eye exhibits no discharge.  Cardiovascular: Regular rhythm and normal heart sounds.  Tachycardia present.   Pulmonary/Chest: Effort normal. No accessory muscle usage. Tachypnea noted. No respiratory distress. She has wheezes (diffuse, expiratory).  Speaks in nearly full sentences with some prolonged expiration  Abdominal: Soft. There is no tenderness.  Neurological: She is alert and oriented to person, place, and time.  Skin:  Skin is warm and dry. She is not diaphoretic.  Nursing note and vitals reviewed.   ED Course  Procedures (including critical care time) Labs Review Labs Reviewed  BASIC METABOLIC PANEL  CBC WITH DIFFERENTIAL/PLATELET    Imaging Review Dg Chest 2 View  04/28/2015   CLINICAL DATA:  Shortness of breath and wheezing today. History of asthma. Ex-smoker.  EXAM: CHEST  2 VIEW  COMPARISON:  11/30/2014.  FINDINGS: Borderline enlarged cardiac silhouette. Clear lungs with diffuse peribronchial thickening. Unremarkable bones.  IMPRESSION: Mild bronchitic changes.   Electronically Signed   By: Claudie Revering M.D.   On: 04/28/2015 14:31     EKG Interpretation   Date/Time:  Monday April 28 2015 11:59:03 EDT Ventricular Rate:  121 PR Interval:  132 QRS Duration: 109 QT Interval:  347 QTC Calculation: 492 R Axis:   40 Text Interpretation:  Sinus tachycardia Consider right atrial enlargement  Low voltage, precordial leads Minimal ST depression, inferior leads  Borderline ST elevation, lateral leads Borderline prolonged QT interval  Baseline wander in lead(s) V4 V5 wander limits interpretation. Confirmed  by Regenia Skeeter  MD, [place] [phone]) on 04/28/2015 12:43:40 PM      MDM   Final diagnoses:  Asthma exacerbation    Patient is much better after continues up-year-old treatments however when she got up to go to the bathroom her oxygen saturation went down to 82%. The patient's wheezing has improved and this feels like a typical asthma exacerbation but she will require hospitalization with oxygen support and further respiratory treatments.    Sherwood Gambler, MD 04/28/15 [phone]

## 2015-04-28 NOTE — H&P (Addendum)
[facility] History and Physical  CHARLOTTA LAPAGLIA OVZ:[id number] DOB: 1976/02/17 DOA: 04/28/2015  Referring physician: Dr. Regenia Skeeter. PCP: Delman Cheadle, MD   Chief Complaint: SOB  HPI: April Ellis is a 39 y.o. female past medical history of asthma who presents to the ED with shortness of breath worse with ambulation that worsen the day prior to admission. She relates she was taking her medication without any relief. She relates that over the last several days she has been getting cold-like symptoms with congestions with some mild shortness of breath with exertion. The day prior to admission it worse and so she decided to come to the ED.  In the ED: A CBC was done that showed leukocytosis with a left shift. Chest x-ray showed no infiltrates.  Review of Systems:  Constitutional:  No weight loss, night sweats, Fevers, chills, fatigue.  HEENT:  No headaches, Difficulty swallowing,Tooth/dental problems,Sore throat,  No sneezing, itching, ear ache, nasal congestion, post nasal drip,  Cardio-vascular:  No chest pain, Orthopnea, PND, swelling in lower extremities, anasarca, dizziness, palpitations  GI:  No heartburn, indigestion, abdominal pain, nausea, vomiting, diarrhea, change in bowel habits, loss of appetite  Resp:   No excess mucus, no productive cough, No non-productive cough, No coughing up of blood.No change in color of mucus.No wheezing.No chest wall deformity  Skin:  no rash or lesions.  GU:  no dysuria, change in color of urine, no urgency or frequency. No flank pain.  Musculoskeletal:  No joint pain or swelling. No decreased range of motion. No back pain.  Psych:  No change in mood or affect. No depression or anxiety. No memory loss.   Past Medical History  Diagnosis Date  . Anemia   . Bronchitis   . Asthma   . Obesity   . COPD (chronic obstructive pulmonary disease)    Past Surgical History  Procedure Laterality Date  . No past surgeries    . Dental surgery        2 teeth pulled, 11/22/14   Social History:  reports that she quit smoking about 7 months ago. Her smoking use included Cigarettes. She has a 3.75 pack-year smoking history. She has never used smokeless tobacco. She reports that she does not drink alcohol or use illicit drugs.  No Known Allergies  Family History  Problem Relation Age of Onset  . Diabetes Mother   . Diabetes Sister   . Hypertension Sister   . Hypertension Sister     Prior to Admission medications   Medication Sig Start Date End Date Taking? Authorizing Provider  albuterol (PROVENTIL HFA;VENTOLIN HFA) 108 (90 BASE) MCG/ACT inhaler Inhale 2 puffs into the lungs every 4 (four) hours as needed for wheezing or shortness of breath. 01/01/15  Yes Shawnee Knapp, MD  albuterol (PROVENTIL) (2.5 MG/3ML) 0.083% nebulizer solution Take 3 mLs (2.5 mg total) by nebulization every 4 (four) hours as needed for wheezing or shortness of breath. 11/18/14 11/19/15 Yes Shawnee Knapp, MD  Fluticasone-Salmeterol (ADVAIR DISKUS) 250-50 MCG/DOSE AEPB Inhale 1 puff into the lungs 2 (two) times daily. 11/18/14  Yes Shawnee Knapp, MD  montelukast (SINGULAIR) 10 MG tablet Take 10 mg by mouth daily.  02/27/15  Yes Historical Provider, MD  predniSONE (DELTASONE) 10 MG tablet Takes 6 tablets for 1 days, then 5 tablets for 1 days, then 4 tablets for 1 days, then 3 tablets for 1 days, then 2 tabs for 1 days, then 1 tab for 1 days, and then stop. Patient not  taking: Reported on 04/28/2015 12/01/14   Charlynne Cousins, MD   Physical Exam: Filed Vitals:   04/28/15 1157 04/28/15 1231 04/28/15 1320 04/28/15 1442  BP: 140/60 105/46 102/58 117/43  Pulse: 127 102 102 115  Temp: 97.7 F (36.5 C)     TempSrc: Oral     Resp: 11 20 18 20   SpO2: 100% 96% 100% 82%    Wt Readings from Last 3 Encounters:  12/01/14 171.3 kg (377 lb 10.4 oz)  11/18/14 180.248 kg (397 lb 6 oz)  09/30/14 179.08 kg (394 lb 12.8 oz)    General:  Appears calm and comfortable Eyes: PERRL, normal  lids, irises & conjunctiva ENT: grossly normal hearing, lips & tongue Neck: no LAD, masses or thyromegaly Cardiovascular: RRR, no m/r/g. No LE edema. Telemetry: SR, no arrhythmias  Respiratory: CTA bilaterally, no w/r/r. Normal respiratory effort. Abdomen: soft, ntnd Skin: no rash or induration seen on limited exam Musculoskeletal: grossly normal tone BUE/BLE Psychiatric: grossly normal mood and affect, speech fluent and appropriate Neurologic: grossly non-focal.          Labs on Admission:  Basic Metabolic Panel:  Recent Labs Lab 04/28/15 1527  NA 136  K 3.7  CL 105  CO2 25  GLUCOSE 168*  BUN 8  CREATININE 0.74  CALCIUM 8.1*   Liver Function Tests: No results for input(s): AST, ALT, ALKPHOS, BILITOT, PROT, ALBUMIN in the last 168 hours. No results for input(s): LIPASE, AMYLASE in the last 168 hours. No results for input(s): AMMONIA in the last 168 hours. CBC:  Recent Labs Lab 04/28/15 1527  WBC 12.9*  NEUTROABS 12.4*  HGB 11.4*  HCT 38.4  MCV 80.2  PLT 264   Cardiac Enzymes: No results for input(s): CKTOTAL, CKMB, CKMBINDEX, TROPONINI in the last 168 hours.  BNP (last 3 results) No results for input(s): BNP in the last 8760 hours.  ProBNP (last 3 results) No results for input(s): PROBNP in the last 8760 hours.  CBG: No results for input(s): GLUCAP in the last 168 hours.  Radiological Exams on Admission: Dg Chest 2 View  04/28/2015   CLINICAL DATA:  Shortness of breath and wheezing today. History of asthma. Ex-smoker.  EXAM: CHEST  2 VIEW  COMPARISON:  11/30/2014.  FINDINGS: Borderline enlarged cardiac silhouette. Clear lungs with diffuse peribronchial thickening. Unremarkable bones.  IMPRESSION: Mild bronchitic changes.   Electronically Signed   By: Claudie Revering M.D.   On: 04/28/2015 14:31    EKG: Independently reviewed. Sinus tachycardia with no specific T-wave changes.  Assessment/Plan Acute respiratory failure with hypoxia due to Asthma  exacerbation - Admitted to Med-Surg, start IV Solu-Medrol, continue IV antibiotics. - Chest x-ray showed no infiltrates shows mild leukocytosis but has remained afebrile. - Continue her on oxygen. - Check CBG in the morning.  Leukocytosis: Likely due to stressed de-margination she has remained febrile will hydrate and repeat the chest x-ray in the morning.  Code Status: full DVT Prophylaxis:heparin Family Communication: none Disposition Plan: inpatient for 1-2 days  Time spent: 60 min  Charlynne Cousins [facility] Pager [phone]

## 2015-04-28 NOTE — Progress Notes (Signed)
Patient received CAT at 20 mg/hr with 0.5 mg of Atrovent. Patient tolerated treatment well and no complications were noted. Post-treatment, patient was still wheezing bilaterally, but increased air movement was noted. Vitals remained stable throughout. RT will continue to monitor patient.

## 2015-04-28 NOTE — ED Notes (Signed)
Bed: WA17 Expected date:  Expected time:  Means of arrival:  Comments: EMS-SOB 

## 2015-04-29 ENCOUNTER — Inpatient Hospital Stay (HOSPITAL_COMMUNITY): Payer: No Typology Code available for payment source

## 2015-04-29 LAB — CBC
HCT: 39.6 % (ref 36.0–46.0)
Hemoglobin: 11.8 g/dL — ABNORMAL LOW (ref 12.0–15.0)
MCH: 24 pg — ABNORMAL LOW (ref 26.0–34.0)
MCHC: 29.8 g/dL — ABNORMAL LOW (ref 30.0–36.0)
MCV: 80.5 fL (ref 78.0–100.0)
Platelets: 298 10*3/uL (ref 150–400)
RBC: 4.92 MIL/uL (ref 3.87–5.11)
RDW: 17.1 % — ABNORMAL HIGH (ref 11.5–15.5)
WBC: 11 10*3/uL — ABNORMAL HIGH (ref 4.0–10.5)

## 2015-04-29 LAB — GLUCOSE, CAPILLARY
Glucose-Capillary: 139 mg/dL — ABNORMAL HIGH (ref 65–99)
Glucose-Capillary: 155 mg/dL — ABNORMAL HIGH (ref 65–99)
Glucose-Capillary: 123 mg/dL — ABNORMAL HIGH (ref 65–99)
Glucose-Capillary: 158 mg/dL — ABNORMAL HIGH (ref 65–99)

## 2015-04-29 MED ORDER — ENOXAPARIN SODIUM 100 MG/ML ~~LOC~~ SOLN
90.0000 mg | SUBCUTANEOUS | Status: DC
  Administered 2015-04-29: 90 mg via SUBCUTANEOUS
  Filled 2015-04-29 (×2): qty 1

## 2015-04-29 MED ORDER — PREDNISONE 50 MG PO TABS
50.0000 mg | ORAL_TABLET | Freq: Every day | ORAL | Status: DC
  Administered 2015-04-29 – 2015-04-30 (×2): 50 mg via ORAL
  Filled 2015-04-29 (×3): qty 1

## 2015-04-29 NOTE — Progress Notes (Signed)
[facility] PROGRESS NOTE Assessment/Plan: Acute respiratory failure with hypoxia due to Asthma exacerbation - Started empirically on IV steroids, inhalers. Transition her steroids to oral. - Her sats have remained greater than 90% on 2 L. - We'll stop her oxygen to neutral steroids and will monitor for 24 hours. - Has remained afebrile leukocytosis is resolved. Chest x-rays pending.    Code Status: full DVT Prophylaxis:heparin Family Communication: none Disposition Plan: inpatient for 1-2 days  Consultants:  NOne  Procedures:  CXR  Antibiotics:  None  HPI/Subjective: Feels better  Objective: Filed Vitals:   04/28/15 1827 04/28/15 1941 04/28/15 2100 04/29/15 0502  BP:   115/62 123/66  Pulse: 98  93 59  Temp:   98 F (36.7 C) 97.6 F (36.4 C)  TempSrc:   Oral Oral  Resp: 20  20 77  Weight:      SpO2: 93% 94% 95% 99%   No intake or output data in the 24 hours ending 04/29/15 0845 Filed Weights   04/28/15 1657  Weight: 183.888 kg (405 lb 6.4 oz)    Exam:  General: Alert, awake, oriented x3, in no acute distress.  HEENT: No bruits, no goiter.  Heart: Regular rate and rhythm. Lungs: Good air movement, mild expiratory wheezing. Abdomen: Soft, nontender, nondistended, positive bowel sounds.  Neuro: Grossly intact, nonfocal.   Data Reviewed: Basic Metabolic Panel:  Recent Labs Lab 04/28/15 1527  NA 136  K 3.7  CL 105  CO2 25  GLUCOSE 168*  BUN 8  CREATININE 0.74  CALCIUM 8.1*   Liver Function Tests: No results for input(s): AST, ALT, ALKPHOS, BILITOT, PROT, ALBUMIN in the last 168 hours. No results for input(s): LIPASE, AMYLASE in the last 168 hours. No results for input(s): AMMONIA in the last 168 hours. CBC:  Recent Labs Lab 04/28/15 1527 04/29/15 0440  WBC 12.9* 11.0*  NEUTROABS 12.4*  --   HGB 11.4* 11.8*  HCT 38.4 39.6  MCV 80.2 80.5  PLT 264 298   Cardiac Enzymes: No results for input(s): CKTOTAL, CKMB, CKMBINDEX,  TROPONINI in the last 168 hours. BNP (last 3 results) No results for input(s): BNP in the last 8760 hours.  ProBNP (last 3 results) No results for input(s): PROBNP in the last 8760 hours.  CBG:  Recent Labs Lab 04/28/15 1708 04/28/15 2202 04/29/15 0744  GLUCAP 151* 162* 139*    No results found for this or any previous visit (from the past 240 hour(s)).   Studies: Dg Chest 2 View  04/28/2015   CLINICAL DATA:  Shortness of breath and wheezing today. History of asthma. Ex-smoker.  EXAM: CHEST  2 VIEW  COMPARISON:  11/30/2014.  FINDINGS: Borderline enlarged cardiac silhouette. Clear lungs with diffuse peribronchial thickening. Unremarkable bones.  IMPRESSION: Mild bronchitic changes.   Electronically Signed   By: Claudie Revering M.D.   On: 04/28/2015 14:31    Scheduled Meds: . enoxaparin (LOVENOX) injection  80 mg Subcutaneous Q24H  . insulin aspart  0-5 Units Subcutaneous QHS  . insulin aspart  0-9 Units Subcutaneous TID WC  . methylPREDNISolone (SOLU-MEDROL) injection  80 mg Intravenous 3 times per day  . mometasone-formoterol  2 puff Inhalation BID  . montelukast  10 mg Oral Daily   Continuous Infusions: . sodium chloride 10 mL/hr at 04/28/15 1809    Time Spent: 25 min   Charlynne Cousins  [facility] Pager [phone]. If 7PM-7AM, please contact night-coverage at [email or web address], password [email or web address] 04/29/2015, 8:45 AM  LOS: 1 day

## 2015-04-30 LAB — GLUCOSE, CAPILLARY
GLUCOSE-CAPILLARY: 87 mg/dL (ref 65–99)
Glucose-Capillary: 99 mg/dL (ref 65–99)
Glucose-Capillary: 65 mg/dL (ref 65–99)

## 2015-04-30 LAB — HEMOGLOBIN A1C
Hgb A1c MFr Bld: 5.4 % (ref 4.8–5.6)
Mean Plasma Glucose: 108 mg/dL

## 2015-04-30 MED ORDER — PREDNISONE 10 MG PO TABS: ORAL_TABLET | ORAL | Status: DC

## 2015-04-30 NOTE — Progress Notes (Signed)
Patient d/c home. Stable. 

## 2015-04-30 NOTE — Discharge Instructions (Signed)
April Ellis was admitted to the Hospital on 04/28/2015 and Discharged on Discharge Date 04/30/2015 and should be excused from work/school   for 3  days starting 04/28/2015 , may return to work/school without any restrictions.  Call Bess Harvest MD, [place] [facility] [phone] with questions.  Charlynne Cousins M.D on 04/30/2015,at 7:52 AM  [facility] Office  [phone]

## 2015-04-30 NOTE — Progress Notes (Signed)
Patient's CBG at 1203 was 65,asymptomatic,orange juice given per protocol. Alert and oreientedx3. I just rechecked CBG, came up to 87. And at this time lunch tray arrived.Patient is stable,she is d/c,awaiting for ride to come.

## 2015-04-30 NOTE — Progress Notes (Signed)
Patient's d/c instructions and prescriptions  given,verbalized understanding.Denies pain.

## 2015-04-30 NOTE — Discharge Summary (Signed)
Physician Discharge Summary  KYNDEL EGGER UXN:[id number] DOB: 06-Jun-1976 DOA: 04/28/2015  PCP: Delman Cheadle, MD  Admit date: 04/28/2015 Discharge date: 04/30/2015  Time spent: 35 minutes  Recommendations for Outpatient Follow-up:  1. Follow up with PCP for your regular visit.  Discharge Diagnoses:  Principal Problem:   Acute respiratory failure with hypoxia Active Problems:   Asthma exacerbation   Discharge Condition: stable  Diet recommendation: heart healthy  Filed Weights   04/28/15 1657 04/29/15 0914  Weight: 183.888 kg (405 lb 6.4 oz) 183.888 kg (405 lb 6.4 oz)    History of present illness:  39 y.o. female past medical history of asthma who presents to the ED with shortness of breath worse with ambulation that worsen the day prior to admission. She relates she was taking her medication without any relief. She relates that over the last several days she has been getting cold-like symptoms with congestions with some mild shortness of breath with exertion.  Hospital Course:  Acute respiratory failure with hypoxia due to Asthma exacerbation - Started empirically on IV steroids, inhalers.  - Her sats have remained greater than 90% on 2 L. - Once her wheezing improved and felt she can ambulate, steroids changed to oral. - she will cont a 7 days tapered, will cont inhalers.  Leukocytosis: - Has remained afebrile leukocytosis is resolved.  Repeated CXR showed no infiltrate.   Procedures:  CXR  Consultations:  none  Discharge Exam: Filed Vitals:   04/30/15 0519  BP: 140/63  Pulse: 53  Temp: 98.1 F (36.7 C)  Resp: 18    General: A&O x3 Cardiovascular: RRR Respiratory: good air movement CTA B/L  Discharge Instructions   Discharge Instructions    Diet - low sodium heart healthy    Complete by:  As directed      Increase activity slowly    Complete by:  As directed           Current Discharge Medication List    CONTINUE these medications which have  CHANGED   Details  predniSONE (DELTASONE) 10 MG tablet Takes 6 tablets for 1 days, then 5 tablets for 1 days, then 4 tablets for 1 days, then 3 tablets for 1 days, then 2 tabs for 1 days, then 1 tab for 1 days, and then stop. Qty: 21 tablet, Refills: 0      CONTINUE these medications which have NOT CHANGED   Details  albuterol (PROVENTIL HFA;VENTOLIN HFA) 108 (90 BASE) MCG/ACT inhaler Inhale 2 puffs into the lungs every 4 (four) hours as needed for wheezing or shortness of breath. Qty: 1 Inhaler, Refills: 3   Associated Diagnoses: SOB (shortness of breath); Asthma with acute exacerbation, moderate persistent    Fluticasone-Salmeterol (ADVAIR DISKUS) 250-50 MCG/DOSE AEPB Inhale 1 puff into the lungs 2 (two) times daily. Qty: 60 each, Refills: 3    montelukast (SINGULAIR) 10 MG tablet Take 10 mg by mouth daily.       STOP taking these medications     albuterol (PROVENTIL) (2.5 MG/3ML) 0.083% nebulizer solution        No Known Allergies Follow-up Information    Follow up with SHAW,EVA, MD In 4 weeks.   Specialty:  Family Medicine   Why:  hospital follow up   Contact information:   [place] [address] [phone]        The results of significant diagnostics from this hospitalization (including imaging, microbiology, ancillary and laboratory) are listed below for reference.    Significant  Diagnostic Studies: Dg Chest 2 View  04/28/2015   CLINICAL DATA:  Shortness of breath and wheezing today. History of asthma. Ex-smoker.  EXAM: CHEST  2 VIEW  COMPARISON:  11/30/2014.  FINDINGS: Borderline enlarged cardiac silhouette. Clear lungs with diffuse peribronchial thickening. Unremarkable bones.  IMPRESSION: Mild bronchitic changes.   Electronically Signed   By: Claudie Revering M.D.   On: 04/28/2015 14:31   Dg Chest Port 1 View  04/29/2015   CLINICAL DATA:  Shortness of breath. History of asthma and bronchitis. Former smoker.  EXAM: PORTABLE CHEST - 1 VIEW  COMPARISON:   04/28/2015  FINDINGS: The heart size is borderline enlarged, likely exaggerated by portable technique. Both lungs are clear. Peribronchial thickening is again noted. The visualized skeletal structures are without acute abnormality. 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  IMPRESSION: No active disease.  Stable peribronchial thickening.   Electronically Signed   By: Fidela Salisbury M.D.   On: 04/29/2015 14:04    Microbiology: No results found for this or any previous visit (from the past 240 hour(s)).   Labs: Basic Metabolic Panel:  Recent Labs Lab 04/28/15 1527  NA 136  K 3.7  CL 105  CO2 25  GLUCOSE 168*  BUN 8  CREATININE 0.74  CALCIUM 8.1*   Liver Function Tests: No results for input(s): AST, ALT, ALKPHOS, BILITOT, PROT, ALBUMIN in the last 168 hours. No results for input(s): LIPASE, AMYLASE in the last 168 hours. No results for input(s): AMMONIA in the last 168 hours. CBC:  Recent Labs Lab 04/28/15 1527 04/29/15 0440  WBC 12.9* 11.0*  NEUTROABS 12.4*  --   HGB 11.4* 11.8*  HCT 38.4 39.6  MCV 80.2 80.5  PLT 264 298   Cardiac Enzymes: No results for input(s): CKTOTAL, CKMB, CKMBINDEX, TROPONINI in the last 168 hours. BNP: BNP (last 3 results) No results for input(s): BNP in the last 8760 hours.  ProBNP (last 3 results) No results for input(s): PROBNP in the last 8760 hours.  CBG:  Recent Labs Lab 04/28/15 2202 04/29/15 0744 04/29/15 1150 04/29/15 1706 04/29/15 2130  GLUCAP 162* 139* 155* 123* 158*       Signed:  FELIZ ORTIZ, Tabbetha Kutscher  [facility] 04/30/2015, 7:53 AM

## 2015-05-22 ENCOUNTER — Other Ambulatory Visit: Payer: Self-pay | Admitting: Family Medicine

## 2015-06-16 ENCOUNTER — Encounter (HOSPITAL_COMMUNITY): Payer: Self-pay | Admitting: Emergency Medicine

## 2015-06-16 ENCOUNTER — Emergency Department (HOSPITAL_COMMUNITY)
Admission: EM | Admit: 2015-06-16 | Discharge: 2015-06-16 | Disposition: A | Discharge status: Home / Self Care | Payer: No Typology Code available for payment source | Attending: Emergency Medicine | Admitting: Emergency Medicine

## 2015-06-16 DIAGNOSIS — J4530 Mild persistent asthma, uncomplicated: Secondary | ICD-10-CM

## 2015-06-16 DIAGNOSIS — Z862 Personal history of diseases of the blood and blood-forming organs and certain disorders involving the immune mechanism: Secondary | ICD-10-CM | POA: Insufficient documentation

## 2015-06-16 DIAGNOSIS — Z79899 Other long term (current) drug therapy: Secondary | ICD-10-CM | POA: Insufficient documentation

## 2015-06-16 DIAGNOSIS — J441 Chronic obstructive pulmonary disease with (acute) exacerbation: Secondary | ICD-10-CM | POA: Insufficient documentation

## 2015-06-16 DIAGNOSIS — Z87891 Personal history of nicotine dependence: Secondary | ICD-10-CM | POA: Insufficient documentation

## 2015-06-16 DIAGNOSIS — E669 Obesity, unspecified: Secondary | ICD-10-CM | POA: Insufficient documentation

## 2015-06-16 MED ORDER — PREDNISONE 20 MG PO TABS: 60.0000 mg | ORAL_TABLET | Freq: Once | ORAL | Status: DC

## 2015-06-16 MED ORDER — PREDNISONE 10 MG PO TABS: ORAL_TABLET | ORAL | Status: DC

## 2015-06-16 MED ORDER — ALBUTEROL SULFATE HFA 108 (90 BASE) MCG/ACT IN AERS
2.0000 | INHALATION_SPRAY | Freq: Once | RESPIRATORY_TRACT | Status: AC
  Administered 2015-06-16: 2 via RESPIRATORY_TRACT
  Filled 2015-06-16: qty 6.7

## 2015-06-16 MED ORDER — PREDNISONE 20 MG PO TABS
60.0000 mg | ORAL_TABLET | Freq: Once | ORAL | Status: AC
  Administered 2015-06-16: 60 mg via ORAL
  Filled 2015-06-16: qty 3

## 2015-06-16 NOTE — ED Notes (Signed)
Patient with Hx of asthma and COPD c/o SOB onset several days ago, states that her home nebulizer isn't working as well and that when she begins feeling slightly SOB she comes to the ED because she has had very severe asthma exacerbations in the past.

## 2015-06-16 NOTE — ED Provider Notes (Signed)
CSN: [id number]     Arrival date & time 06/16/15  1507 History   First MD Initiated Contact with Patient 06/16/15 1724     Chief Complaint  Patient presents with  . Asthma      HPI Patient with Hx of asthma and COPD c/o SOB onset several days ago, states that her home nebulizer isn't working as well and that when she begins feeling slightly SOB she comes to the ED because she has had very severe asthma exacerbations in the past. Past Medical History  Diagnosis Date  . Anemia   . Bronchitis   . Asthma   . Obesity   . COPD (chronic obstructive pulmonary disease)    Past Surgical History  Procedure Laterality Date  . No past surgeries    . Dental surgery      2 teeth pulled, 11/22/14   Family History  Problem Relation Age of Onset  . Diabetes Mother   . Diabetes Sister   . Hypertension Sister   . Hypertension Sister    Social History  Substance Use Topics  . Smoking status: Former Smoker -- 0.25 packs/day for 15 years    Types: Cigarettes    Quit date: 09/16/2014  . Smokeless tobacco: Never Used  . Alcohol Use: No   OB History    No data available     Review of Systems  Respiratory: Positive for shortness of breath and wheezing.       Allergies  Review of patient's allergies indicates no known allergies.  Home Medications   Prior to Admission medications   Medication Sig Start Date End Date Taking? Authorizing Provider  albuterol (PROVENTIL HFA;VENTOLIN HFA) 108 (90 BASE) MCG/ACT inhaler Inhale 2 puffs into the lungs every 4 (four) hours as needed for wheezing or shortness of breath. 01/01/15  Yes Shawnee Knapp, MD  albuterol (PROVENTIL) (2.5 MG/3ML) 0.083% nebulizer solution Take 3 mLs (2.5 mg total) by nebulization every 4 (four) hours as needed. PATIENT NEEDS OFFICE VISIT/HOSP F/UP FOR ADDITIONAL REFILLS 05/23/15  Yes Shawnee Knapp, MD  montelukast (SINGULAIR) 10 MG tablet Take 10 mg by mouth daily.  02/27/15  Yes Historical Provider, MD  Fluticasone-Salmeterol (ADVAIR  DISKUS) 250-50 MCG/DOSE AEPB Inhale 1 puff into the lungs 2 (two) times daily. Patient not taking: Reported on 06/16/2015 11/18/14   Shawnee Knapp, MD  predniSONE (DELTASONE) 20 MG tablet Take 3 tablets (60 mg total) by mouth once. 06/16/15   Leonard Schwartz, MD   BP 141/71 mmHg  Pulse 84  Temp(Src) 98.1 F (36.7 C) (Oral)  Resp 20  SpO2 98% Physical Exam  Constitutional: She is oriented to person, place, and time. She appears well-developed and well-nourished. No distress.  HENT:  Head: Normocephalic and atraumatic.  Eyes: Pupils are equal, round, and reactive to light.  Neck: Normal range of motion.  Cardiovascular: Normal rate and intact distal pulses.   Pulmonary/Chest: No respiratory distress. She has wheezes.  Abdominal: Normal appearance. She exhibits no distension.  Musculoskeletal: Normal range of motion.  Neurological: She is alert and oriented to person, place, and time. No cranial nerve deficit.  Skin: Skin is warm and dry. No rash noted.  Psychiatric: She has a normal mood and affect. Her behavior is normal.  Nursing note and vitals reviewed.   ED Course  Procedures (including critical care time) Medications  predniSONE (DELTASONE) tablet 60 mg (60 mg Oral Given 06/16/15 1755)  albuterol (PROVENTIL HFA;VENTOLIN HFA) 108 (90 BASE) MCG/ACT inhaler 2 puff (2  puffs Inhalation Given 06/16/15 1755)      After treatment in the ED the patient feels back to baseline and wants to go home.  MDM   Final diagnoses:  Asthma, mild persistent, uncomplicated        Leonard Schwartz, MD 06/19/15 [phone]

## 2015-06-16 NOTE — Discharge Instructions (Signed)

## 2015-06-16 NOTE — ED Notes (Signed)
Pt verbalized understanding of discharge orders and prescription.

## 2015-07-03 ENCOUNTER — Encounter (HOSPITAL_COMMUNITY): Payer: Self-pay | Admitting: Emergency Medicine

## 2015-07-03 ENCOUNTER — Emergency Department (HOSPITAL_COMMUNITY)
Admission: EM | Admit: 2015-07-03 | Discharge: 2015-07-03 | Disposition: A | Discharge status: Home / Self Care | Payer: No Typology Code available for payment source | Attending: Emergency Medicine | Admitting: Emergency Medicine

## 2015-07-03 DIAGNOSIS — J4541 Moderate persistent asthma with (acute) exacerbation: Secondary | ICD-10-CM

## 2015-07-03 DIAGNOSIS — J45901 Unspecified asthma with (acute) exacerbation: Secondary | ICD-10-CM

## 2015-07-03 DIAGNOSIS — Z87891 Personal history of nicotine dependence: Secondary | ICD-10-CM | POA: Insufficient documentation

## 2015-07-03 DIAGNOSIS — R0602 Shortness of breath: Secondary | ICD-10-CM

## 2015-07-03 DIAGNOSIS — E669 Obesity, unspecified: Secondary | ICD-10-CM | POA: Insufficient documentation

## 2015-07-03 DIAGNOSIS — Z862 Personal history of diseases of the blood and blood-forming organs and certain disorders involving the immune mechanism: Secondary | ICD-10-CM | POA: Insufficient documentation

## 2015-07-03 DIAGNOSIS — J441 Chronic obstructive pulmonary disease with (acute) exacerbation: Secondary | ICD-10-CM | POA: Insufficient documentation

## 2015-07-03 DIAGNOSIS — Z79899 Other long term (current) drug therapy: Secondary | ICD-10-CM | POA: Insufficient documentation

## 2015-07-03 MED ORDER — ALBUTEROL SULFATE (2.5 MG/3ML) 0.083% IN NEBU: 2.5000 mg | INHALATION_SOLUTION | RESPIRATORY_TRACT | Status: DC | PRN

## 2015-07-03 MED ORDER — ALBUTEROL SULFATE HFA 108 (90 BASE) MCG/ACT IN AERS: 2.0000 | INHALATION_SPRAY | RESPIRATORY_TRACT | Status: DC | PRN

## 2015-07-03 MED ORDER — PREDNISONE 20 MG PO TABS: 60.0000 mg | ORAL_TABLET | Freq: Every day | ORAL | Status: DC

## 2015-07-03 MED ORDER — IPRATROPIUM BROMIDE 0.02 % IN SOLN
0.5000 mg | Freq: Once | RESPIRATORY_TRACT | Status: AC
  Administered 2015-07-03: 0.5 mg via RESPIRATORY_TRACT
  Filled 2015-07-03: qty 2.5

## 2015-07-03 MED ORDER — ALBUTEROL SULFATE (2.5 MG/3ML) 0.083% IN NEBU
5.0000 mg | INHALATION_SOLUTION | Freq: Once | RESPIRATORY_TRACT | Status: AC
  Administered 2015-07-03: 5 mg via RESPIRATORY_TRACT
  Filled 2015-07-03: qty 6

## 2015-07-03 MED ORDER — PREDNISONE 20 MG PO TABS
60.0000 mg | ORAL_TABLET | Freq: Once | ORAL | Status: AC
  Administered 2015-07-03: 60 mg via ORAL
  Filled 2015-07-03: qty 3

## 2015-07-03 NOTE — Discharge Instructions (Signed)
Asthma, Adult Asthma is a recurring condition in which the airways tighten and narrow. Asthma can make it difficult to breathe. It can cause coughing, wheezing, and shortness of breath. Asthma episodes, also called asthma attacks, range from minor to life-threatening. Asthma cannot be cured, but medicines and lifestyle changes can help control it. CAUSES Asthma is believed to be caused by inherited (genetic) and environmental factors, but its exact cause is unknown. Asthma may be triggered by allergens, lung infections, or irritants in the air. Asthma triggers are different for each person. Common triggers include:   Animal dander.  Dust mites.  Cockroaches.  Pollen from trees or grass.  Mold.  Smoke.  Air pollutants such as dust, household cleaners, hair sprays, aerosol sprays, paint fumes, strong chemicals, or strong odors.  Cold air, weather changes, and winds (which increase molds and pollens in the air).  Strong emotional expressions such as crying or laughing hard.  Stress.  Certain medicines (such as aspirin) or types of drugs (such as beta-blockers).  Sulfites in foods and drinks. Foods and drinks that may contain sulfites include dried fruit, potato chips, and sparkling grape juice.  Infections or inflammatory conditions such as the flu, a cold, or an inflammation of the nasal membranes (rhinitis).  Gastroesophageal reflux disease (GERD).  Exercise or strenuous activity. SYMPTOMS Symptoms may occur immediately after asthma is triggered or many hours later. Symptoms include:  Wheezing.  Excessive nighttime or early morning coughing.  Frequent or severe coughing with a common cold.  Chest tightness.  Shortness of breath. DIAGNOSIS  The diagnosis of asthma is made by a review of your medical history and a physical exam. Tests may also be performed. These may include:  Lung function studies. These tests show how much air you breathe in and out.  Allergy  tests.  Imaging tests such as X-rays. TREATMENT  Asthma cannot be cured, but it can usually be controlled. Treatment involves identifying and avoiding your asthma triggers. It also involves medicines. There are 2 classes of medicine used for asthma treatment:   Controller medicines. These prevent asthma symptoms from occurring. They are usually taken every day.  Reliever or rescue medicines. These quickly relieve asthma symptoms. They are used as needed and provide short-term relief. Your health care provider will help you create an asthma action plan. An asthma action plan is a written plan for managing and treating your asthma attacks. It includes a list of your asthma triggers and how they may be avoided. It also includes information on when medicines should be taken and when their dosage should be changed. An action plan may also involve the use of a device called a peak flow meter. A peak flow meter measures how well the lungs are working. It helps you monitor your condition. HOME CARE INSTRUCTIONS   Take medicines only as directed by your health care provider. Speak with your health care provider if you have questions about how or when to take the medicines.  Use a peak flow meter as directed by your health care provider. Record and keep track of readings.  Understand and use the action plan to help minimize or stop an asthma attack without needing to seek medical care.  Control your home environment in the following ways to help prevent asthma attacks:  Do not smoke. Avoid being exposed to secondhand smoke.  Change your heating and air conditioning filter regularly.  Limit your use of fireplaces and wood stoves.  Get rid of pests (such as roaches   and mice) and their droppings.  Throw away plants if you see mold on them.  Clean your floors and dust regularly. Use unscented cleaning products.  Try to have someone else vacuum for you regularly. Stay out of rooms while they are  being vacuumed and for a short while afterward. If you vacuum, use a dust mask from a hardware store, a double-layered or microfilter vacuum cleaner bag, or a vacuum cleaner with a HEPA filter.  Replace carpet with wood, tile, or vinyl flooring. Carpet can trap dander and dust.  Use allergy-proof pillows, mattress covers, and box spring covers.  Wash bed sheets and blankets every week in hot water and dry them in a dryer.  Use blankets that are made of polyester or cotton.  Clean bathrooms and kitchens with bleach. If possible, have someone repaint the walls in these rooms with mold-resistant paint. Keep out of the rooms that are being cleaned and painted.  Wash hands frequently. SEEK MEDICAL CARE IF:   You have wheezing, shortness of breath, or a cough even if taking medicine to prevent attacks.  The colored mucus you cough up (sputum) is thicker than usual.  Your sputum changes from clear or white to yellow, green, gray, or bloody.  You have any problems that may be related to the medicines you are taking (such as a rash, itching, swelling, or trouble breathing).  You are using a reliever medicine more than 2-3 times per week.  Your peak flow is still at 50-79% of your personal best after following your action plan for 1 hour.  You have a fever. SEEK IMMEDIATE MEDICAL CARE IF:   You seem to be getting worse and are unresponsive to treatment during an asthma attack.  You are short of breath even at rest.  You get short of breath when doing very little physical activity.  You have difficulty eating, drinking, or talking due to asthma symptoms.  You develop chest pain.  You develop a fast heartbeat.  You have a bluish color to your lips or fingernails.  You are light-headed, dizzy, or faint.  Your peak flow is less than 50% of your personal best.   This information is not intended to replace advice given to you by your health care provider. Make sure you discuss any  questions you have with your health care provider.   Document Released: 09/13/2005 Document Revised: 06/04/2015 Document Reviewed: 04/12/2013 Elsevier Interactive Patient Education 2016 [employer]  

## 2015-07-03 NOTE — ED Notes (Signed)
Pt c/o trouble breathing since last night. Pt has asthma and states that she has inhaler and nebulizer at home.  Pt states that for a week now her tongue has been numb.

## 2015-07-03 NOTE — ED Provider Notes (Signed)
CSN: [id number]     Arrival date & time 07/03/15  4431 History   First MD Initiated Contact with Patient 07/03/15 [phone]     Chief Complaint  Patient presents with  . Asthma     (Consider location/radiation/quality/duration/timing/severity/associated sxs/prior Treatment) HPI Comments: 39 y.o. Female with history of asthma, previous smoker presents for wheezing and shortness of breath.  Patient says this feels like previous episodes of asthma.  Symptoms have been progressively worsening over last few days, worse last night without improvement after nebulizer treatment ad 10 mg of prednisone.  No fevers, chills, recent illness, or known sick contacts.  Patient is a 39 y.o. female presenting with asthma.  Asthma Associated symptoms include shortness of breath. Pertinent negatives include no abdominal pain and no headaches.    Past Medical History  Diagnosis Date  . Anemia   . Bronchitis   . Asthma   . Obesity   . COPD (chronic obstructive pulmonary disease) [facility])    Past Surgical History  Procedure Laterality Date  . No past surgeries    . Dental surgery      2 teeth pulled, 11/22/14   Family History  Problem Relation Age of Onset  . Diabetes Mother   . Diabetes Sister   . Hypertension Sister   . Hypertension Sister    Social History  Substance Use Topics  . Smoking status: Former Smoker -- 0.25 packs/day for 15 years    Types: Cigarettes    Quit date: 09/16/2014  . Smokeless tobacco: Never Used  . Alcohol Use: No   OB History    No data available     Review of Systems  Constitutional: Negative for fever, chills, appetite change and fatigue.  HENT: Negative for congestion, postnasal drip and rhinorrhea.   Eyes: Negative for pain and redness.  Respiratory: Positive for cough (non productive), shortness of breath and wheezing. Negative for chest tightness.   Gastrointestinal: Negative for nausea, vomiting, abdominal pain and diarrhea.  Genitourinary: Negative for  dysuria, urgency and hematuria.  Musculoskeletal: Negative for myalgias and back pain.  Skin: Negative for rash.  Neurological: Negative for dizziness, weakness, light-headedness and headaches.  Hematological: Does not bruise/bleed easily.      Allergies  Review of patient's allergies indicates no known allergies.  Home Medications   Prior to Admission medications   Medication Sig Start Date End Date Taking? Authorizing Provider  albuterol (PROVENTIL HFA;VENTOLIN HFA) 108 (90 BASE) MCG/ACT inhaler Inhale 2 puffs into the lungs every 4 (four) hours as needed for wheezing or shortness of breath. 01/01/15   Shawnee Knapp, MD  albuterol (PROVENTIL) (2.5 MG/3ML) 0.083% nebulizer solution Take 3 mLs (2.5 mg total) by nebulization every 4 (four) hours as needed. PATIENT NEEDS OFFICE VISIT/HOSP F/UP FOR ADDITIONAL REFILLS 05/23/15   Shawnee Knapp, MD  Fluticasone-Salmeterol (ADVAIR DISKUS) 250-50 MCG/DOSE AEPB Inhale 1 puff into the lungs 2 (two) times daily. Patient not taking: Reported on 06/16/2015 11/18/14   Shawnee Knapp, MD  montelukast (SINGULAIR) 10 MG tablet Take 10 mg by mouth daily.  02/27/15   Historical Provider, MD  predniSONE (DELTASONE) 20 MG tablet Take 3 tablets (60 mg total) by mouth once. 06/16/15   Leonard Schwartz, MD   BP 128/62 mmHg  Pulse 102  Temp(Src) 98.1 F (36.7 C) (Oral)  Resp 20  SpO2 99% Physical Exam  Constitutional: She is oriented to person, place, and time. She appears well-developed and well-nourished. No distress.  HENT:  Head: Normocephalic and atraumatic.  Right Ear: External ear normal.  Left Ear: External ear normal.  Nose: Nose normal.  Mouth/Throat: Oropharynx is clear and moist. No oropharyngeal exudate.  Eyes: EOM are normal. Pupils are equal, round, and reactive to light.  Neck: Normal range of motion. Neck supple.  Cardiovascular: Regular rhythm, normal heart sounds and intact distal pulses.  Tachycardia present.   No murmur heard. Pulmonary/Chest: Effort  normal. No accessory muscle usage. No tachypnea. No respiratory distress. She has wheezes (bilateral, symmetric). She has no rales.  Abdominal: Soft. She exhibits no distension. There is no tenderness.  Musculoskeletal: Normal range of motion. She exhibits no edema or tenderness.  Neurological: She is alert and oriented to person, place, and time.  Skin: Skin is warm and dry. No rash noted. She is not diaphoretic.  Vitals reviewed.   ED Course  Procedures (including critical care time) Labs Review Labs Reviewed - No data to display  Imaging Review No results found. I have personally reviewed and evaluated these images and lab results as part of my medical decision-making.   EKG Interpretation None      MDM  Patient seen and evaluated in stable condition.  Presenting with asthma exacerbation.  Afebrile.  Will give breathing treatments and prednisone.  10:13 AM Improved.  Will give second treatment.    After second treatment examination improved again.  Patient ambulated to the bathroom without difficulty and had normal oxygen saturations on return.  Patient was discharged home in stable condition with prescriptions for albuterol and prednisone and instruction to follow up outpatient.  Final diagnoses:  None    1. Asthma exacerbation    Harvel Quale, MD 07/04/15 2133

## 2015-08-12 ENCOUNTER — Emergency Department (HOSPITAL_COMMUNITY)
Admission: EM | Admit: 2015-08-12 | Discharge: 2015-08-12 | Disposition: A | Discharge status: Home / Self Care | Payer: Self-pay | Attending: Emergency Medicine | Admitting: Emergency Medicine

## 2015-08-12 ENCOUNTER — Encounter (HOSPITAL_COMMUNITY): Payer: Self-pay

## 2015-08-12 ENCOUNTER — Emergency Department (HOSPITAL_COMMUNITY): Payer: No Typology Code available for payment source

## 2015-08-12 DIAGNOSIS — J441 Chronic obstructive pulmonary disease with (acute) exacerbation: Secondary | ICD-10-CM | POA: Insufficient documentation

## 2015-08-12 DIAGNOSIS — Z862 Personal history of diseases of the blood and blood-forming organs and certain disorders involving the immune mechanism: Secondary | ICD-10-CM | POA: Insufficient documentation

## 2015-08-12 DIAGNOSIS — Z79899 Other long term (current) drug therapy: Secondary | ICD-10-CM | POA: Insufficient documentation

## 2015-08-12 DIAGNOSIS — J45901 Unspecified asthma with (acute) exacerbation: Secondary | ICD-10-CM

## 2015-08-12 DIAGNOSIS — Z87891 Personal history of nicotine dependence: Secondary | ICD-10-CM | POA: Insufficient documentation

## 2015-08-12 MED ORDER — PREDNISONE 20 MG PO TABS: 40.0000 mg | ORAL_TABLET | Freq: Every day | ORAL | Status: DC

## 2015-08-12 MED ORDER — ALBUTEROL SULFATE (2.5 MG/3ML) 0.083% IN NEBU: 5.0000 mg | INHALATION_SOLUTION | Freq: Once | RESPIRATORY_TRACT | Status: DC

## 2015-08-12 MED ORDER — ALBUTEROL SULFATE HFA 108 (90 BASE) MCG/ACT IN AERS
2.0000 | INHALATION_SPRAY | RESPIRATORY_TRACT | Status: DC | PRN
  Administered 2015-08-12: 2 via RESPIRATORY_TRACT
  Filled 2015-08-12: qty 6.7

## 2015-08-12 MED ORDER — ALBUTEROL (5 MG/ML) CONTINUOUS INHALATION SOLN
10.0000 mg/h | INHALATION_SOLUTION | RESPIRATORY_TRACT | Status: AC
Start: 2015-08-12 — End: 2015-08-12
  Administered 2015-08-12: 10 mg/h via RESPIRATORY_TRACT
  Filled 2015-08-12: qty 20

## 2015-08-12 MED ORDER — ALBUTEROL SULFATE (2.5 MG/3ML) 0.083% IN NEBU: 2.5000 mg | INHALATION_SOLUTION | Freq: Four times a day (QID) | RESPIRATORY_TRACT | Status: DC | PRN

## 2015-08-12 MED ORDER — PREDNISONE 20 MG PO TABS
60.0000 mg | ORAL_TABLET | Freq: Once | ORAL | Status: AC
  Administered 2015-08-12: 60 mg via ORAL
  Filled 2015-08-12: qty 3

## 2015-08-12 NOTE — Discharge Instructions (Signed)
Take the prescribed medication as directed. Recommend to continue albuterol nebs at home very 4-6 hours.  Use inhaler given here for breakthrough. Follow-up with your primary care physician. Return to the ED for new or worsening symptoms.

## 2015-08-12 NOTE — ED Notes (Signed)
Patient c/o SOB x 4 days ago. Patient states she has been using her nebulizer 4-5 times a day. Patient states she has a productive cough with clear sputum.

## 2015-08-12 NOTE — ED Provider Notes (Signed)
CSN: [id number]     Arrival date & time 08/12/15  1100 History   First MD Initiated Contact with Patient 08/12/15 1132     Chief Complaint  Patient presents with  . Shortness of Breath     (Consider location/radiation/quality/duration/timing/severity/associated sxs/prior Treatment) Patient is a 39 y.o. female presenting with shortness of breath. The history is provided by the patient and medical records.  Shortness of Breath Associated symptoms: cough     39 year old female with history of anemia, asthma, COPD, presenting to the ED for shortness of breath that has been progressively worsening over the past 4 days. Patient states since the weather has turned cold her asthma has been worse than normal. She states she has been using albuterol nebs and inhalers 4-5 times daily for the past 3 days. She reports she has had a productive cough with clear sputum, but this has improved after taking Mucinex. She denies any fever or chills. No sick contacts. Patient has been hospitalized for her asthma in the past, most recent was August 2016. She states her symptoms were much more severe at that time. She has never been intubated in the past.  Vital signs stable on arrival.  Past Medical History  Diagnosis Date  . Anemia   . Bronchitis   . Asthma   . Obesity   . COPD (chronic obstructive pulmonary disease) [facility])    Past Surgical History  Procedure Laterality Date  . No past surgeries    . Dental surgery      2 teeth pulled, 11/22/14   Family History  Problem Relation Age of Onset  . Diabetes Mother   . Diabetes Sister   . Hypertension Sister   . Hypertension Sister    Social History  Substance Use Topics  . Smoking status: Former Smoker -- 0.25 packs/day for 15 years    Types: Cigarettes    Quit date: 09/16/2014  . Smokeless tobacco: Never Used  . Alcohol Use: No   OB History    No data available     Review of Systems  Respiratory: Positive for cough and shortness of breath.    All other systems reviewed and are negative.     Allergies  Review of patient's allergies indicates no known allergies.  Home Medications   Prior to Admission medications   Medication Sig Start Date End Date Taking? Authorizing Provider  albuterol (PROVENTIL HFA;VENTOLIN HFA) 108 (90 BASE) MCG/ACT inhaler Inhale 2 puffs into the lungs every 4 (four) hours as needed for wheezing or shortness of breath. 07/03/15  Yes Harvel Quale, MD  albuterol (PROVENTIL) (2.5 MG/3ML) 0.083% nebulizer solution Take 3 mLs (2.5 mg total) by nebulization every 4 (four) hours as needed. PATIENT NEEDS OFFICE VISIT/HOSP F/UP FOR ADDITIONAL REFILLS Patient taking differently: Take 2.5 mg by nebulization every 4 (four) hours as needed for wheezing or shortness of breath. PATIENT NEEDS OFFICE VISIT/HOSP F/UP FOR ADDITIONAL REFILLS 07/03/15  Yes Harvel Quale, MD  Fluticasone-Salmeterol (ADVAIR DISKUS) 250-50 MCG/DOSE AEPB Inhale 1 puff into the lungs 2 (two) times daily. Patient not taking: Reported on 06/16/2015 11/18/14   Shawnee Knapp, MD  predniSONE (DELTASONE) 20 MG tablet Take 3 tablets (60 mg total) by mouth daily. Patient not taking: Reported on 08/12/2015 07/03/15   Harvel Quale, MD   BP 116/69 mmHg  Pulse 103  Temp(Src) 98.3 F (36.8 C) (Oral)  Resp 11  SpO2 92%  LMP 08/09/2015   Physical Exam  Constitutional: She is oriented to  person, place, and time. She appears well-developed and well-nourished. No distress.  Morbidly obese  HENT:  Head: Normocephalic and atraumatic.  Mouth/Throat: Oropharynx is clear and moist.  Eyes: Conjunctivae and EOM are normal. Pupils are equal, round, and reactive to light.  Neck: Normal range of motion. Neck supple.  Cardiovascular: Normal rate, regular rhythm and normal heart sounds.   Pulmonary/Chest: Effort normal. No accessory muscle usage. No tachypnea. No respiratory distress. She has wheezes. She has no rhonchi. She has no rales.  Diffuse inspiratory and  expiratory wheezes without retractions or accessory muscle use, able to speak in full sentences without difficulty  Abdominal: Soft. Bowel sounds are normal. There is no tenderness. There is no guarding.  Musculoskeletal: Normal range of motion. She exhibits no edema.  Neurological: She is alert and oriented to person, place, and time.  Skin: Skin is warm and dry. She is not diaphoretic.  Psychiatric: She has a normal mood and affect.  Nursing note and vitals reviewed.   ED Course  Procedures (including critical care time)  CRITICAL CARE Performed by: Larene Pickett   Total critical care time: 40 minutes  Critical care time was exclusive of separately billable procedures and treating other patients.  Critical care was necessary to treat or prevent imminent or life-threatening deterioration.  Critical care was time spent personally by me on the following activities: development of treatment plan with patient and/or surrogate as well as nursing, discussions with consultants, evaluation of patient's response to treatment, examination of patient, obtaining history from patient or surrogate, ordering and performing treatments and interventions, ordering and review of laboratory studies, ordering and review of radiographic studies, pulse oximetry and re-evaluation of patient's condition.  Medications  albuterol (PROVENTIL,VENTOLIN) solution continuous neb (10 mg/hr Nebulization New Bag/Given 08/12/15 1203)  albuterol (PROVENTIL HFA;VENTOLIN HFA) 108 (90 BASE) MCG/ACT inhaler 2 puff (not administered)  predniSONE (DELTASONE) tablet 60 mg (60 mg Oral Given 08/12/15 1156)   Labs Review Labs Reviewed - No data to display  Imaging Review Dg Chest 2 View  08/12/2015  CLINICAL DATA:  Increasing shortness of breath over 4 days, history asthma, COPD, former smoker EXAM: CHEST  2 VIEW COMPARISON:  04/29/2015 FINDINGS: Enlargement of cardiac silhouette. Slight pulmonary vascular congestion.  Mediastinal contours normal. Eventration RIGHT diaphragm stable. No acute infiltrate, pleural effusion or pneumothorax. Bones unremarkable. IMPRESSION: Enlargement of cardiac silhouette with slight pulmonary vascular congestion. No acute infiltrate. Electronically Signed   By: Lavonia Dana M.D.   On: 08/12/2015 12:16   I have personally reviewed and evaluated these images and lab results as part of my medical decision-making.   EKG Interpretation None      MDM   Final diagnoses:  Asthma, unspecified asthma severity, with acute exacerbation   39 year old female here with shortness of breath and wheezing. This is been ongoing for the past 4 days. Patient is afebrile, nontoxic. She does have audible inspiratory and expiratory wheezing but is in no acute respiratory distress. She is able to speak in full sentences without difficulty. She used 2 albuterol nebs prior to arrival without significant relief. Will give dose of prednisone here and start on hour-long neb.  12:45 PM Patient now half way through neb, states she is feeling better.  VS remain stable.  Will re-check once neb finished.  1330-- hour long neb has finished. Patient states she is feeling significantly better.  Shortly after this patient has pulse ox of 90% documented at 1335, however went back into room and patient  does not even have a pulse oximeter present on her finger nor any O2 values on monitor. Pulse ox replaced, 93% on RA.  Discussed with patient that her O2 sats are borderline and may warrant admission.  Patient states she does not want to be admitted, she states she is feeling better and denies SOB at this time.  She states would like to go home and continue neb treatments if needed.  Given that patient is young without multiple comorbidities, feel this is reasonable.  Patient placed on strict home regimen of albuterol nebs Q4-6H as needed for SOB/wheezing.  Start prednisone taper today.  Rescue inhaler given in ED.  Patient  will FU with her PCP.  She was instructed to return here for any new/worsening symptoms.  Discussed plan with patient, he/she acknowledged understanding and agreed with plan of care.  Larene Pickett, PA-C 08/12/15 [place], MD 08/13/15 [phone]

## 2015-08-25 ENCOUNTER — Emergency Department (HOSPITAL_COMMUNITY)
Admission: EM | Admit: 2015-08-25 | Discharge: 2015-08-25 | Disposition: A | Discharge status: Home / Self Care | Payer: No Typology Code available for payment source | Attending: Emergency Medicine | Admitting: Emergency Medicine

## 2015-08-25 ENCOUNTER — Encounter (HOSPITAL_COMMUNITY): Payer: Self-pay | Admitting: Emergency Medicine

## 2015-08-25 DIAGNOSIS — Z862 Personal history of diseases of the blood and blood-forming organs and certain disorders involving the immune mechanism: Secondary | ICD-10-CM | POA: Insufficient documentation

## 2015-08-25 DIAGNOSIS — E669 Obesity, unspecified: Secondary | ICD-10-CM | POA: Insufficient documentation

## 2015-08-25 DIAGNOSIS — Z87891 Personal history of nicotine dependence: Secondary | ICD-10-CM | POA: Insufficient documentation

## 2015-08-25 DIAGNOSIS — Z7952 Long term (current) use of systemic steroids: Secondary | ICD-10-CM | POA: Insufficient documentation

## 2015-08-25 DIAGNOSIS — J449 Chronic obstructive pulmonary disease, unspecified: Secondary | ICD-10-CM | POA: Insufficient documentation

## 2015-08-25 DIAGNOSIS — Z79899 Other long term (current) drug therapy: Secondary | ICD-10-CM | POA: Insufficient documentation

## 2015-08-25 DIAGNOSIS — H9202 Otalgia, left ear: Secondary | ICD-10-CM | POA: Insufficient documentation

## 2015-08-25 MED ORDER — IBUPROFEN 800 MG PO TABS: 800.0000 mg | ORAL_TABLET | Freq: Three times a day (TID) | ORAL | Status: DC

## 2015-08-25 NOTE — Discharge Instructions (Signed)
Earache An earache, also called otalgia, can be caused by many things. Pain from an earache can be sharp, dull, or burning. The pain may be temporary or constant. Earaches can be caused by problems with the ear, such as infection in either the middle ear or the ear canal, injury, impacted ear wax, middle ear pressure, or a foreign body in the ear. Ear pain can also result from problems in other areas. This is called referred pain. For example, pain can come from a sore throat, a tooth infection, or problems with the jaw or the joint between the jaw and the skull (temporomandibular joint, or TMJ). The cause of an earache is not always easy to identify. Watchful waiting may be appropriate for some earaches until a clear cause of the pain can be found. HOME CARE INSTRUCTIONS Watch your condition for any changes. The following actions may help to lessen any discomfort that you are feeling:  Take medicines only as directed by your health care provider. This includes ear drops.  Apply ice to your outer ear to help reduce pain.  Put ice in a plastic bag.  Place a towel between your skin and the bag.  Leave the ice on for 20 minutes, 2-3 times per day.  Do not put anything in your ear other than medicine that is prescribed by your health care provider.  Try resting in an upright position instead of lying down. This may help to reduce pressure in the middle ear and relieve pain.  Chew gum if it helps to relieve your ear pain.  Control any allergies that you have.  Keep all follow-up visits as directed by your health care provider. This is important. SEEK MEDICAL CARE IF:  Your pain does not improve within 2 days.  You have a fever.  You have new or worsening symptoms. SEEK IMMEDIATE MEDICAL CARE IF:  You have a severe headache.  You have a stiff neck.  You have difficulty swallowing.  You have redness or swelling behind your ear.  You have drainage from your ear.  You have hearing  loss.  You feel dizzy.   This information is not intended to replace advice given to you by your health care provider. Make sure you discuss any questions you have with your health care provider.   Document Released: 04/30/2004 Document Revised: 10/04/2014 Document Reviewed: 04/14/2014 Elsevier Interactive Patient Education 2016 [employer]  

## 2015-08-25 NOTE — ED Provider Notes (Signed)
CSN: [id number]     Arrival date & time 08/25/15  1115 History   First MD Initiated Contact with Patient 08/25/15 1143     Chief Complaint  Patient presents with  . Otalgia     (Consider location/radiation/quality/duration/timing/severity/associated sxs/prior Treatment) HPI Comments: She states that she has some pressure on the left cheek. No fever. No problems with facial droop or eye tearing.  Patient is a 39 y.o. female presenting with ear pain. The history is provided by the patient. No language interpreter was used.  Otalgia Location:  Left Quality:  Aching Severity:  Moderate Onset quality:  Sudden Duration:  3 days Timing:  Intermittent Progression:  Unchanged Chronicity:  New Context: not direct blow, not elevation change, not foreign body in ear and not loud noise   Relieved by:  Nothing Worsened by:  Nothing tried Ineffective treatments:  None tried Associated symptoms: no fever, no headaches, no rhinorrhea and no sore throat     Past Medical History  Diagnosis Date  . Anemia   . Bronchitis   . Asthma   . Obesity   . COPD (chronic obstructive pulmonary disease) [facility])    Past Surgical History  Procedure Laterality Date  . No past surgeries    . Dental surgery      2 teeth pulled, 11/22/14   Family History  Problem Relation Age of Onset  . Diabetes Mother   . Diabetes Sister   . Hypertension Sister   . Hypertension Sister    Social History  Substance Use Topics  . Smoking status: Former Smoker -- 0.25 packs/day for 15 years    Types: Cigarettes    Quit date: 09/16/2014  . Smokeless tobacco: Never Used  . Alcohol Use: No   OB History    No data available     Review of Systems  Constitutional: Negative for fever.  HENT: Positive for ear pain. Negative for rhinorrhea and sore throat.   Neurological: Negative for headaches.  All other systems reviewed and are negative.     Allergies  Review of patient's allergies indicates no known  allergies.  Home Medications   Prior to Admission medications   Medication Sig Start Date End Date Taking? Authorizing Provider  albuterol (PROVENTIL) (2.5 MG/3ML) 0.083% nebulizer solution Take 3 mLs (2.5 mg total) by nebulization every 6 (six) hours as needed for wheezing or shortness of breath. 08/12/15   Larene Pickett, PA-C  Fluticasone-Salmeterol (ADVAIR DISKUS) 250-50 MCG/DOSE AEPB Inhale 1 puff into the lungs 2 (two) times daily. Patient not taking: Reported on 06/16/2015 11/18/14   Shawnee Knapp, MD  ibuprofen (ADVIL,MOTRIN) 800 MG tablet Take 1 tablet (800 mg total) by mouth 3 (three) times daily. 08/25/15   Glendell Docker, NP  predniSONE (DELTASONE) 20 MG tablet Take 2 tablets (40 mg total) by mouth daily. Take 40 mg by mouth daily for 3 days, then 20mg  by mouth daily for 3 days, then 10mg  daily for 3 days 08/12/15   Larene Pickett, PA-C   BP 147/79 mmHg  Pulse 94  Temp(Src) 97.7 F (36.5 C) (Oral)  Resp 18  Ht 5' (1.524 m)  Wt 183.707 kg  BMI 79.10 kg/m2  SpO2 98%  LMP 08/09/2015 Physical Exam  Constitutional: She is oriented to person, place, and time. She appears well-developed and well-nourished.  HENT:  Head: Normocephalic and atraumatic.  Right Ear: External ear normal.  Left Ear: External ear normal.  Mouth/Throat: Oropharynx is clear and moist.  Eyes: Conjunctivae and  EOM are normal. Pupils are equal, round, and reactive to light.  Cardiovascular: Normal rate and regular rhythm.   Pulmonary/Chest: Effort normal and breath sounds normal.  Musculoskeletal: Normal range of motion.  Neurological: She is alert and oriented to person, place, and time.  Skin: Skin is warm and dry.  Nursing note and vitals reviewed.   ED Course  Procedures (including critical care time) Labs Review Labs Reviewed - No data to display  Imaging Review No results found. I have personally reviewed and evaluated these images and lab results as part of my medical decision-making.   EKG  Interpretation None      MDM   Final diagnoses:  Otalgia, left    Discussed with pt possibly early sign of bells although not present at this time. Discussed follow up and return precautions    Glendell Docker, NP 08/25/15 [place], MD 08/27/15 [id number]

## 2015-08-25 NOTE — ED Notes (Signed)
Patient reports left ear pain and left sinus pressure since Friday 11/25.

## 2015-08-26 ENCOUNTER — Inpatient Hospital Stay (HOSPITAL_COMMUNITY)
Admission: EM | Admit: 2015-08-26 | Discharge: 2015-08-28 | DRG: 189 | Disposition: A | Discharge status: Home / Self Care | Payer: Self-pay | Attending: Internal Medicine | Admitting: Internal Medicine

## 2015-08-26 ENCOUNTER — Encounter (HOSPITAL_COMMUNITY): Payer: Self-pay

## 2015-08-26 DIAGNOSIS — Z87891 Personal history of nicotine dependence: Secondary | ICD-10-CM

## 2015-08-26 DIAGNOSIS — J449 Chronic obstructive pulmonary disease, unspecified: Secondary | ICD-10-CM | POA: Diagnosis present

## 2015-08-26 DIAGNOSIS — Z8249 Family history of ischemic heart disease and other diseases of the circulatory system: Secondary | ICD-10-CM

## 2015-08-26 DIAGNOSIS — J4489 Other specified chronic obstructive pulmonary disease: Secondary | ICD-10-CM | POA: Diagnosis present

## 2015-08-26 DIAGNOSIS — G4733 Obstructive sleep apnea (adult) (pediatric): Secondary | ICD-10-CM | POA: Diagnosis present

## 2015-08-26 DIAGNOSIS — R0682 Tachypnea, not elsewhere classified: Secondary | ICD-10-CM | POA: Diagnosis present

## 2015-08-26 DIAGNOSIS — Z6841 Body Mass Index (BMI) 40.0 and over, adult: Secondary | ICD-10-CM

## 2015-08-26 DIAGNOSIS — J45901 Unspecified asthma with (acute) exacerbation: Secondary | ICD-10-CM | POA: Diagnosis present

## 2015-08-26 DIAGNOSIS — E669 Obesity, unspecified: Secondary | ICD-10-CM | POA: Diagnosis present

## 2015-08-26 DIAGNOSIS — Z833 Family history of diabetes mellitus: Secondary | ICD-10-CM

## 2015-08-26 DIAGNOSIS — R000 Tachycardia, unspecified: Secondary | ICD-10-CM | POA: Diagnosis present

## 2015-08-26 DIAGNOSIS — D509 Iron deficiency anemia, unspecified: Secondary | ICD-10-CM | POA: Diagnosis present

## 2015-08-26 DIAGNOSIS — J9601 Acute respiratory failure with hypoxia: Principal | ICD-10-CM | POA: Diagnosis present

## 2015-08-26 DIAGNOSIS — R0902 Hypoxemia: Secondary | ICD-10-CM | POA: Diagnosis present

## 2015-08-26 MED ORDER — MAGNESIUM SULFATE 2 GM/50ML IV SOLN
2.0000 g | Freq: Once | INTRAVENOUS | Status: AC
  Administered 2015-08-27: 2 g via INTRAVENOUS
  Filled 2015-08-26: qty 50

## 2015-08-26 MED ORDER — ALBUTEROL (5 MG/ML) CONTINUOUS INHALATION SOLN
20.0000 mg/h | INHALATION_SOLUTION | Freq: Once | RESPIRATORY_TRACT | Status: AC
  Administered 2015-08-26: 20 mg/h via RESPIRATORY_TRACT
  Filled 2015-08-26: qty 20

## 2015-08-26 NOTE — ED Provider Notes (Signed)
CSN: [id number]     Arrival date & time 08/26/15  2227 History  By signing my name below, I, Jolayne Panther, attest that this documentation has been prepared under the direction and in the presence of Junius Creamer, NP. Electronically Signed: Jolayne Panther, Scribe. 08/26/2015. 11:11 PM.    Chief Complaint  Patient presents with  . Shortness of Breath    The history is provided by the patient. No language interpreter was used.   HPI Comments: April Ellis is a 39 y.o. female with a Hx of asthma who presents to the Emergency Department complaining of gradually worsening SOB onset earlier today. Pt reports feeling like she has a slight cold. She reports using her albuterol inhaler at home with no relief. She also reports that her last hospitalization due to her asthma was in August and states that she has never been intubated for her asthma. Her LNMP was last month. She denies fever and any chance of pregnancy. She has no other complaints at this time.   Past Medical History  Diagnosis Date  . Anemia   . Bronchitis   . Asthma   . Obesity   . COPD (chronic obstructive pulmonary disease) [facility])    Past Surgical History  Procedure Laterality Date  . No past surgeries    . Dental surgery      2 teeth pulled, 11/22/14   Family History  Problem Relation Age of Onset  . Diabetes Mother   . Diabetes Sister   . Hypertension Sister   . Hypertension Sister    Social History  Substance Use Topics  . Smoking status: Former Smoker -- 0.25 packs/day for 15 years    Types: Cigarettes    Quit date: 09/16/2014  . Smokeless tobacco: Never Used  . Alcohol Use: No   OB History    No data available     Review of Systems  Constitutional: Negative for fever.  Respiratory: Positive for cough, shortness of breath and wheezing.   Cardiovascular: Negative for chest pain.  All other systems reviewed and are negative.     Allergies  Review of patient's allergies indicates no  known allergies.  Home Medications   Prior to Admission medications   Medication Sig Start Date End Date Taking? Authorizing Provider  albuterol (PROVENTIL) (2.5 MG/3ML) 0.083% nebulizer solution Take 3 mLs (2.5 mg total) by nebulization every 6 (six) hours as needed for wheezing or shortness of breath. 08/12/15  Yes Larene Pickett, PA-C  ibuprofen (ADVIL,MOTRIN) 800 MG tablet Take 1 tablet (800 mg total) by mouth 3 (three) times daily. 08/25/15  Yes Glendell Docker, NP  IRON PO Take 1 tablet by mouth daily.   Yes Historical Provider, MD  Fluticasone-Salmeterol (ADVAIR DISKUS) 250-50 MCG/DOSE AEPB Inhale 1 puff into the lungs 2 (two) times daily. Patient not taking: Reported on 06/16/2015 11/18/14   Shawnee Knapp, MD  predniSONE (DELTASONE) 10 MG tablet Take 2 tablets (20 mg total) by mouth daily. 08/27/15   Junius Creamer, NP   BP 101/49 mmHg  Pulse 114  Temp(Src) 98.5 F (36.9 C) (Oral)  Resp 19  SpO2 87%  LMP 08/09/2015 Physical Exam  Constitutional: She is oriented to person, place, and time. She appears well-developed and well-nourished. No distress.  HENT:  Head: Normocephalic.  Eyes: Conjunctivae are normal. Pupils are equal, round, and reactive to light.  Neck: Normal range of motion.  Cardiovascular: Normal rate.   Pulmonary/Chest: No respiratory distress. She has wheezes. She exhibits  no tenderness.  Abdominal: She exhibits no distension.  Musculoskeletal: Normal range of motion.  Neurological: She is alert and oriented to person, place, and time.  Skin: Skin is warm and dry.  Psychiatric: She has a normal mood and affect.  Nursing note and vitals reviewed.   ED Course  Procedures ( DIAGNOSTIC STUDIES:    Oxygen Saturation is 100% on RA, normal by my interpretation.   COORDINATION OF CARE:  11:10 PM Will prescribe pt visium. Discussed treatment plan with pt at bedside and pt agreed to plan.    Labs Review Labs Reviewed - No data to display  Imaging Review No  results found. I have personally reviewed and evaluated these images and lab results as part of my medical decision-making.   EKG Interpretation None     Received treatment albuterol neb and IV Steroid by EMS Patient has been given IV Solu-Medrol.  3 of your treatment, one being a continuous neb 20 mg/h over the past hour.  She's also been given 2 g of IV magnesium sulfate.  She is no longer audibly wheezing.  She has a very fine expiratory wheeze, but she has become hypoxic, satting at 86-90% when she is ambulated.  Her sats remained in the high 80s MDM   Final diagnoses:  Asthma exacerbation   I personally performed the services described in this documentation, which was scribed in my presence. The recorded information has been reviewed and is accurate.     Junius Creamer, NP [id number]  Delora Fuel, MD [id number]

## 2015-08-26 NOTE — ED Notes (Signed)
Bed: WA03 Expected date:  Expected time:  Means of arrival:  Comments: EMS 

## 2015-08-26 NOTE — ED Notes (Signed)
Patient ambulated across hallway to restroom.

## 2015-08-26 NOTE — ED Notes (Signed)
Patient transported by EMS from home.  Patient c/o [facility] all day increasing over the last two hours.  Patient used nebs at home with minimal relief.  Patient was given solu-medrol 125mg  and duoneb en route.  No other complaints at this time.

## 2015-08-27 ENCOUNTER — Telehealth: Payer: Self-pay

## 2015-08-27 ENCOUNTER — Encounter (HOSPITAL_COMMUNITY): Payer: Self-pay | Admitting: *Deleted

## 2015-08-27 ENCOUNTER — Observation Stay (HOSPITAL_COMMUNITY): Payer: No Typology Code available for payment source

## 2015-08-27 DIAGNOSIS — G4733 Obstructive sleep apnea (adult) (pediatric): Secondary | ICD-10-CM

## 2015-08-27 DIAGNOSIS — J9601 Acute respiratory failure with hypoxia: Principal | ICD-10-CM

## 2015-08-27 DIAGNOSIS — J45901 Unspecified asthma with (acute) exacerbation: Secondary | ICD-10-CM

## 2015-08-27 DIAGNOSIS — D509 Iron deficiency anemia, unspecified: Secondary | ICD-10-CM

## 2015-08-27 DIAGNOSIS — J449 Chronic obstructive pulmonary disease, unspecified: Secondary | ICD-10-CM

## 2015-08-27 LAB — BASIC METABOLIC PANEL
ANION GAP: 8 (ref 5–15)
BUN: 9 mg/dL (ref 6–20)
CHLORIDE: 101 mmol/L (ref 101–111)
CO2: 25 mmol/L (ref 22–32)
Calcium: 8.8 mg/dL — ABNORMAL LOW (ref 8.9–10.3)
Creatinine, Ser: 0.87 mg/dL (ref 0.44–1.00)
GFR calc non Af Amer: >60 mL/min (ref >60)
GLUCOSE: 290 mg/dL — AB (ref 65–99)
POTASSIUM: 4.5 mmol/L (ref 3.5–5.1)
Sodium: 134 mmol/L — ABNORMAL LOW (ref 135–145)

## 2015-08-27 LAB — CBC
HEMATOCRIT: 38.3 % (ref 36.0–46.0)
HEMOGLOBIN: 11.5 g/dL — AB (ref 12.0–15.0)
MCH: 24.5 pg — AB (ref 26.0–34.0)
MCHC: 30 g/dL (ref 30.0–36.0)
MCV: 81.5 fL (ref 78.0–100.0)
Platelets: 245 10*3/uL (ref 150–400)
RBC: 4.7 MIL/uL (ref 3.87–5.11)
RDW: 16.4 % — ABNORMAL HIGH (ref 11.5–15.5)
WBC: 13.7 10*3/uL — ABNORMAL HIGH (ref 4.0–10.5)

## 2015-08-27 MED ORDER — ACETAMINOPHEN 325 MG PO TABS: 650.0000 mg | ORAL_TABLET | Freq: Four times a day (QID) | ORAL | Status: DC | PRN

## 2015-08-27 MED ORDER — METHYLPREDNISOLONE SODIUM SUCC 125 MG IJ SOLR
60.0000 mg | Freq: Three times a day (TID) | INTRAMUSCULAR | Status: DC
  Administered 2015-08-27 – 2015-08-28 (×4): 60 mg via INTRAVENOUS
  Filled 2015-08-27 (×5): qty 0.96

## 2015-08-27 MED ORDER — IPRATROPIUM BROMIDE 0.02 % IN SOLN
0.5000 mg | Freq: Four times a day (QID) | RESPIRATORY_TRACT | Status: DC
  Administered 2015-08-27 – 2015-08-28 (×6): 0.5 mg via RESPIRATORY_TRACT
  Filled 2015-08-27 (×6): qty 2.5

## 2015-08-27 MED ORDER — IPRATROPIUM BROMIDE 0.02 % IN SOLN: 0.5000 mg | RESPIRATORY_TRACT | Status: DC

## 2015-08-27 MED ORDER — PREDNISONE 10 MG PO TABS: 20.0000 mg | ORAL_TABLET | Freq: Every day | ORAL | Status: DC

## 2015-08-27 MED ORDER — LEVALBUTEROL HCL 1.25 MG/0.5ML IN NEBU
1.2500 mg | INHALATION_SOLUTION | Freq: Four times a day (QID) | RESPIRATORY_TRACT | Status: DC
  Administered 2015-08-27 – 2015-08-28 (×6): 1.25 mg via RESPIRATORY_TRACT
  Filled 2015-08-27 (×7): qty 0.5

## 2015-08-27 MED ORDER — SODIUM CHLORIDE 0.9 % IJ SOLN
3.0000 mL | Freq: Two times a day (BID) | INTRAMUSCULAR | Status: DC
  Administered 2015-08-27 – 2015-08-28 (×3): 3 mL via INTRAVENOUS

## 2015-08-27 MED ORDER — FERROUS SULFATE 325 (65 FE) MG PO TABS
325.0000 mg | ORAL_TABLET | Freq: Every day | ORAL | Status: DC
  Administered 2015-08-27 – 2015-08-28 (×2): 325 mg via ORAL
  Filled 2015-08-27 (×2): qty 1

## 2015-08-27 MED ORDER — LEVALBUTEROL HCL 1.25 MG/0.5ML IN NEBU
1.2500 mg | INHALATION_SOLUTION | Freq: Four times a day (QID) | RESPIRATORY_TRACT | Status: DC
Start: 2015-08-27 — End: 2015-08-27
  Filled 2015-08-27 (×3): qty 0.5

## 2015-08-27 MED ORDER — ENOXAPARIN SODIUM 80 MG/0.8ML ~~LOC~~ SOLN
80.0000 mg | SUBCUTANEOUS | Status: DC
  Administered 2015-08-27 – 2015-08-28 (×2): 80 mg via SUBCUTANEOUS
  Filled 2015-08-27 (×2): qty 0.8

## 2015-08-27 MED ORDER — AZITHROMYCIN 250 MG PO TABS
250.0000 mg | ORAL_TABLET | Freq: Every day | ORAL | Status: DC
  Administered 2015-08-28: 250 mg via ORAL
  Filled 2015-08-27 (×2): qty 1

## 2015-08-27 MED ORDER — IBUPROFEN 800 MG PO TABS
800.0000 mg | ORAL_TABLET | Freq: Three times a day (TID) | ORAL | Status: DC
  Administered 2015-08-27 – 2015-08-28 (×4): 800 mg via ORAL
  Filled 2015-08-27 (×5): qty 1

## 2015-08-27 MED ORDER — AZITHROMYCIN 500 MG PO TABS
500.0000 mg | ORAL_TABLET | Freq: Every day | ORAL | Status: AC
  Administered 2015-08-27: 500 mg via ORAL
  Filled 2015-08-27 (×2): qty 1

## 2015-08-27 MED ORDER — DM-GUAIFENESIN ER 30-600 MG PO TB12
1.0000 | ORAL_TABLET | Freq: Two times a day (BID) | ORAL | Status: DC | PRN
  Filled 2015-08-27: qty 1

## 2015-08-27 MED ORDER — ACETAMINOPHEN 650 MG RE SUPP: 650.0000 mg | Freq: Four times a day (QID) | RECTAL | Status: DC | PRN

## 2015-08-27 NOTE — Progress Notes (Signed)
RT took CPAP to room per physician order.  Patient refused CPAP use stating that she does not wear one at home.  Patient did have sleep study done previously, but states that she was not told that she had OSA.  Patient requested that CPAP machine be removed from the room because she will not wear it.  Order D/C per RT protocol.

## 2015-08-27 NOTE — ED Notes (Signed)
Dr Niu at bedside 

## 2015-08-27 NOTE — Telephone Encounter (Signed)
Request received from Dessa Phi, RN CM for a hospital follow up appointment for the patient and an evaluation for the [facility] ( TCC). The record was reviewed and the patient does not meet criteria for TCC but a hospital follow up appointment was made for 09/10/15 @ 1430 with Dr Jarold Song at the [facility].  The appointment information was placed on the AVS.  Update provided to K. Mahabir, RN CM.

## 2015-08-27 NOTE — ED Notes (Signed)
NP at bedside.

## 2015-08-27 NOTE — Progress Notes (Addendum)
Lovenox per Pharmacy for DVT Prophylaxis    Pharmacy has been consulted from dosing enoxaparin (lovenox) in this patient for DVT prophylaxis.  The pharmacist has reviewed pertinent labs (Hgb _11.5__; PLT__245_), patient weight (_181__kg) and renal function (CrCl_>90__mL/min) and decided that enoxaparin _80_mg SQ Q_24_Hrs is appropriate for this patient.  The pharmacy department will sign off at this time.  Please reconsult pharmacy if status changes or for further issues.  Thank you  Cyndia Diver PharmD, BCPS  08/27/2015, 3:03 AM

## 2015-08-27 NOTE — ED Notes (Signed)
Patient reports improvement in her respiratory effort. No complaints at this time.

## 2015-08-27 NOTE — Discharge Instructions (Signed)
Asthma, Acute Bronchospasm °Acute bronchospasm caused by asthma is also referred to as an asthma attack. Bronchospasm means your air passages become narrowed. The narrowing is caused by inflammation and tightening of the muscles in the air tubes (bronchi) in your lungs. This can make it hard to breathe or cause you to wheeze and cough. °CAUSES °Possible triggers are: °· Animal dander from the skin, hair, or feathers of animals. °· Dust mites contained in house dust. °· Cockroaches. °· Pollen from trees or grass. °· Mold. °· Cigarette or tobacco smoke. °· Air pollutants such as dust, household cleaners, hair sprays, aerosol sprays, paint fumes, strong chemicals, or strong odors. °· Cold air or weather changes. Cold air may trigger inflammation. Winds increase molds and pollens in the air. °· Strong emotions such as crying or laughing hard. °· Stress. °· Certain medicines such as aspirin or beta-blockers. °· Sulfites in foods and drinks, such as dried fruits and wine. °· Infections or inflammatory conditions, such as a flu, cold, or inflammation of the nasal membranes (rhinitis). °· Gastroesophageal reflux disease (GERD). GERD is a condition where stomach acid backs up into your esophagus. °· Exercise or strenuous activity. °SIGNS AND SYMPTOMS  °· Wheezing. °· Excessive coughing, particularly at night. °· Chest tightness. °· Shortness of breath. °DIAGNOSIS  °Your health care provider will ask you about your medical history and perform a physical exam. A chest X-ray or blood testing may be performed to look for other causes of your symptoms or other conditions that may have triggered your asthma attack.  °TREATMENT  °Treatment is aimed at reducing inflammation and opening up the airways in your lungs.  Most asthma attacks are treated with inhaled medicines. These include quick relief or rescue medicines (such as bronchodilators) and controller medicines (such as inhaled corticosteroids). These medicines are sometimes  given through an inhaler or a nebulizer. Systemic steroid medicine taken by mouth or given through an IV tube also can be used to reduce the inflammation when an attack is moderate or severe. Antibiotic medicines are only used if a bacterial infection is present.  °HOME CARE INSTRUCTIONS  °· Rest. °· Drink plenty of liquids. This helps the mucus to remain thin and be easily coughed up. Only use caffeine in moderation and do not use alcohol until you have recovered from your illness. °· Do not smoke. Avoid being exposed to secondhand smoke. °· You play a critical role in keeping yourself in good health. Avoid exposure to things that cause you to wheeze or to have breathing problems. °· Keep your medicines up-to-date and available. Carefully follow your health care provider's treatment plan. °· Take your medicine exactly as prescribed. °· When pollen or pollution is bad, keep windows closed and use an air conditioner or go to places with air conditioning. °· Asthma requires careful medical care. See your health care provider for a follow-up as advised. If you are more than [redacted] weeks pregnant and you were prescribed any new medicines, let your obstetrician know about the visit and how you are doing. Follow up with your health care provider as directed. °· After you have recovered from your asthma attack, make an appointment with your outpatient doctor to talk about ways to reduce the likelihood of future attacks. If you do not have a doctor who manages your asthma, make an appointment with a primary care doctor to discuss your asthma. °SEEK IMMEDIATE MEDICAL CARE IF:  °· You are getting worse. °· You have trouble breathing. If severe, call your local   emergency services (911 in the [place]  You develop chest pain or discomfort.  You are vomiting.  You are not able to keep fluids down.  You are coughing up yellow, green, brown, or bloody sputum.  You have a fever and your symptoms suddenly get worse.  You have  trouble swallowing. MAKE SURE YOU:   Understand these instructions.  Will watch your condition.  Will get help right away if you are not doing well or get worse.   This information is not intended to replace advice given to you by your health care provider. Make sure you discuss any questions you have with your health care provider.   Document Released: 12/29/2006 Document Revised: 09/18/2013 Document Reviewed: 03/21/2013 Elsevier Interactive Patient Education 2016 [place] the  Medication as directed until all tablets taken  Use the inhaler every 4-6 hours for the next 1-2 days than your normal routine

## 2015-08-27 NOTE — H&P (Signed)
[facility] History and Physical  April Ellis [id number] DOB: 03/15/1976 DOA: 08/26/2015  Referring physician: ED physician PCP: Delman Cheadle, MD  Specialists:   Chief Complaint: Shortness of breath and wheezing  HPI: April Ellis is a 39 y.o. female with PMH of COPD, asthma, iron deficiency anemia, obesity, OSA, who presents with shortness of breath and wheezing.  Patient reports in the past 2 days, she has worsening shortness of breath and wheezing. She does not have cough or chest pain. No fever or chills. No runny nose or sore throat. Patient does not have abdominal pain, diarrhea, symptoms of UTI or unilateral weakness.  In ED, patient was found to have oxygen desaturation to 86% on room air, tachycardia, tachypnea, normal temperature. Chest x-ray showed slight pulmonary vascular congestion, no infiltration. Pending CMP and cbc. Patient submitted to inpatient for further evaluation and treatment.  Where does patient live?   At home    Can patient participate in ADLs?  Yes      Review of Systems:   General: no fevers, chills, no changes in body weight, has fatigue HEENT: no blurry vision, hearing changes or sore throat Pulm: has dyspnea and wheezing. No cough. CV: no chest pain, palpitations Abd: no nausea, vomiting, abdominal pain, diarrhea, constipation GU: no dysuria, burning on urination, increased urinary frequency, hematuria  Ext: no leg edema Neuro: no unilateral weakness, numbness, or tingling, no vision change or hearing loss Skin: no rash MSK: No muscle spasm, no deformity, no limitation of range of movement in spin Heme: No easy bruising.  Travel history: No recent long distant travel.  Allergy: No Known Allergies  Past Medical History  Diagnosis Date  . Anemia   . Bronchitis   . Asthma   . Obesity   . COPD (chronic obstructive pulmonary disease) [facility])     Past Surgical History  Procedure Laterality Date  . No past surgeries    . Dental  surgery      2 teeth pulled, 11/22/14    Social History:  reports that she quit smoking about a year ago. Her smoking use included Cigarettes. She has a 3.75 pack-year smoking history. She has never used smokeless tobacco. She reports that she does not drink alcohol or use illicit drugs.  Family History:  Family History  Problem Relation Age of Onset  . Diabetes Mother   . Diabetes Sister   . Hypertension Sister   . Hypertension Sister      Prior to Admission medications   Medication Sig Start Date End Date Taking? Authorizing Provider  albuterol (PROVENTIL) (2.5 MG/3ML) 0.083% nebulizer solution Take 3 mLs (2.5 mg total) by nebulization every 6 (six) hours as needed for wheezing or shortness of breath. 08/12/15  Yes Larene Pickett, PA-C  ibuprofen (ADVIL,MOTRIN) 800 MG tablet Take 1 tablet (800 mg total) by mouth 3 (three) times daily. 08/25/15  Yes Glendell Docker, NP  IRON PO Take 1 tablet by mouth daily.   Yes Historical Provider, MD  Fluticasone-Salmeterol (ADVAIR DISKUS) 250-50 MCG/DOSE AEPB Inhale 1 puff into the lungs 2 (two) times daily. Patient not taking: Reported on 06/16/2015 11/18/14   Shawnee Knapp, MD  predniSONE (DELTASONE) 10 MG tablet Take 2 tablets (20 mg total) by mouth daily. 08/27/15   Junius Creamer, NP    Physical Exam: Filed Vitals:   08/27/15 0130 08/27/15 0200 08/27/15 0230 08/27/15 0301  BP: 101/49 112/58 105/56 136/76  Pulse: 114 113 108 99  Temp:    98.1 F (  36.7 C)  TempSrc:    Oral  Resp: 19 20 20 20   Height:    5\' 1"  (1.549 m)  Weight:    181.8 kg (400 lb 12.7 oz)  SpO2: 87% 94% 92% 98%   General: Not in acute distress HEENT:       Eyes: PERRL, EOMI, no scleral icterus.       ENT: No discharge from the ears and nose, no pharynx injection, no tonsillar enlargement.        Neck: No JVD, no bruit, no mass felt. Heme: No neck lymph node enlargement. Cardiac: S1/S2, RRR, tachycardia, No murmurs, No gallops or rubs. Pulm: Decreased air movement  bilaterally. Has wheezing bilaterally. No rales or rubs. Abd: Soft, nondistended, nontender, no rebound pain, no organomegaly, BS present. Ext: No pitting leg edema bilaterally. 2+DP/PT pulse bilaterally. Musculoskeletal: No joint deformities, No joint redness or warmth, no limitation of ROM in spin. Skin: No rashes.  Neuro: Alert, oriented X3, cranial nerves II-XII grossly intact, muscle strength 5/5 in all extremities, sensation to light touch intact.  Psych: Patient is not psychotic, no suicidal or hemocidal ideation.  Labs on Admission:  Basic Metabolic Panel: No results for input(s): NA, K, CL, CO2, GLUCOSE, BUN, CREATININE, CALCIUM, MG, PHOS in the last 168 hours. Liver Function Tests: No results for input(s): AST, ALT, ALKPHOS, BILITOT, PROT, ALBUMIN in the last 168 hours. No results for input(s): LIPASE, AMYLASE in the last 168 hours. No results for input(s): AMMONIA in the last 168 hours. CBC: No results for input(s): WBC, NEUTROABS, HGB, HCT, MCV, PLT in the last 168 hours. Cardiac Enzymes: No results for input(s): CKTOTAL, CKMB, CKMBINDEX, TROPONINI in the last 168 hours.  BNP (last 3 results) No results for input(s): BNP in the last 8760 hours.  ProBNP (last 3 results) No results for input(s): PROBNP in the last 8760 hours.  CBG: No results for input(s): GLUCAP in the last 168 hours.  Radiological Exams on Admission: No results found.  EKG: Not done in ED, will get one.   Assessment/Plan Principal Problem:   Asthma exacerbation Active Problems:   Morbid obesity (HCC)   COPD (chronic obstructive pulmonary disease) (HCC)   OSA (obstructive sleep apnea)   Acute respiratory failure with hypoxia (HCC)   Iron deficiency anemia  Acute respiratory failure with hypoxia (HCC) due to asthma exacerbation: Patient's shortness of breath and wheezing on auscultation are consistent with asthma exacerbation. No infiltration on chest x-ray. Patient was treated with 2 g of  magnesium sulfate in the emergency room.  -will admit patient to telemetry bed for observation -Nebulizers: scheduled Atrovent and prn xopenex -Solu-Medrol 60 mg IV tid -Oral azithromycin for 5 days.  -Mucinex for cough  -Urine drug screen, HIV  COPD (chronic obstructive pulmonary disease) ([place]): No productive cough, not exacerbated -see above for breathing treatment  OSA: -CPAP  Iron deficiency anemia: -f/u CBC -Continue iron supplement.  DVT ppx: SQ Heparin         Code Status: Full code Family Communication: None at bed side.      Disposition Plan: Admit to inpatient   Date of Service 08/27/2015    Ivor Costa [facility] Pager [phone]  If 7PM-7AM, please contact night-coverage [email or web address] Password [email or web address] 08/27/2015, 3:08 AM

## 2015-08-27 NOTE — Care Management Note (Signed)
Case Management Note  Patient Details  Name: JOCELIN BARNES MRN: [id number] Date of Birth: 10-Jul-1976  Subjective/Objective:   39 y/o f admitted w/COPD. From home.Qualifies for home 02-[facility] DME rep Lecretia aware.Await Home 02 orders. No health insurance-will provide resources-Health insurance info,$[employer] med list.                 Action/Plan:d/c plan home.   Expected Discharge Date:                  Expected Discharge Plan:  Home/Self Care  In-House Referral:     Discharge planning Services  CM Consult, Medication Assistance  Post Acute Care Choice:    Choice offered to:     DME Arranged:    DME Agency:     HH Arranged:    HH Agency:     Status of Service:  In process, will continue to follow  Medicare Important Message Given:    Date Medicare IM Given:    Medicare IM give by:    Date Additional Medicare IM Given:    Additional Medicare Important Message give by:     If discussed at [place] of Stay Meetings, dates discussed:    Additional Comments:  Dessa Phi, RN 08/27/2015, 1:37 PM

## 2015-08-27 NOTE — Progress Notes (Addendum)
   [facility]                                                                              Patient Demographics  April Ellis, is a 39 y.o. female, DOB - 10-16-1975, [id number]  Admit date - 08/26/2015   Admitting Physician Ivor Costa, MD  Outpatient Primary MD for the patient is Delman Cheadle, MD  LOS -    Chief Complaint  Patient presents with  . Shortness of Breath      HPI on 08/27/2015 by Dr. Jerrye Beavers is a 39 y.o. female with PMH of COPD, asthma, iron deficiency anemia, obesity, OSA, who presents with shortness of breath and wheezing.  Patient reports in the past 2 days, she has worsening shortness of breath and wheezing. She does not have cough or chest pain. No fever or chills. No runny nose or sore throat. Patient does not have abdominal pain, diarrhea, symptoms of UTI or unilateral weakness.  In ED, patient was found to have oxygen desaturation to 86% on room air, tachycardia, tachypnea, normal temperature. Chest x-ray showed slight pulmonary vascular congestion, no infiltration. Pending CMP and cbc. Patient submitted to inpatient for further evaluation and treatment.  Assessment & Plan   Patient admitted earlier this morning by Dr. Ivor Costa.  Please see full H&P for details. Continue current assessment and plan.  Acute respiratory failure with hypoxia likely secondary to asthma exacerbation -Patient did have mild wheezing on exam -Chest x-ray -Patient was given 2 g of magnesium sulfate emergency department as well as nebulizer treatments.  Upon ambulation, oxygenation saturations dropped into the 80s -Continue Solu-Medrol, azithromycin, nebulizer treatments, supplemental oxygen, Mucinex -Today, patient's oxygen saturations did drop with what ambulation, 85%  COPD -Patient has no productive cough at this time currently compensated  Obstructive sleep apnea -Continue CPAP QHS  Iron deficiency anemia -Baseline hemoglobin 10-11, currently  11.5 -Continue monitor CBC  Morbid obesity -BMI 75.9 -Upon discharge, patient will need follow-up with primary care physician to discuss lifestyle modifications including diet and exercise.  Code Status: Full  Family Communication: None at bedside  Disposition Plan: Admitted.  Continue current treatment.  Time Spent in minutes   30 minutes  Procedures  None  Consults   None  DVT Prophylaxis  Lovenox  [name] D.O. on 08/27/2015 at 11:26 AM  Between 7am to 7pm - Pager - [phone]  After 7pm go to [email or web address] - password [email or web address]  And look for the night coverage person covering for me after hours  [facility] Office  [phone]

## 2015-08-27 NOTE — ED Notes (Signed)
SPO2 on room air, 86-90% while sitting. Patient denies SOB, is able to speak in complete sentences.

## 2015-08-27 NOTE — Progress Notes (Signed)
SATURATION QUALIFICATIONS: (This note is used to comply with regulatory documentation for home oxygen)  Patient Saturations on Room Air at Rest = 93%  Patient Saturations on Room Air while Ambulating = 85%  Patient Saturations on 2 Liters of oxygen while Ambulating = 94%  Please briefly explain why patient needs home oxygen: 

## 2015-08-28 LAB — BASIC METABOLIC PANEL
ANION GAP: 5 (ref 5–15)
BUN: 15 mg/dL (ref 6–20)
CALCIUM: 9.1 mg/dL (ref 8.9–10.3)
CO2: 29 mmol/L (ref 22–32)
Chloride: 102 mmol/L (ref 101–111)
Creatinine, Ser: 0.76 mg/dL (ref 0.44–1.00)
GFR calc non Af Amer: >60 mL/min (ref >60)
Glucose, Bld: 179 mg/dL — ABNORMAL HIGH (ref 65–99)
POTASSIUM: 4.8 mmol/L (ref 3.5–5.1)
Sodium: 136 mmol/L (ref 135–145)

## 2015-08-28 MED ORDER — AZITHROMYCIN 250 MG PO TABS: 250.0000 mg | ORAL_TABLET | Freq: Every day | ORAL | Status: DC

## 2015-08-28 MED ORDER — IPRATROPIUM BROMIDE 0.02 % IN SOLN: 0.5000 mg | Freq: Four times a day (QID) | RESPIRATORY_TRACT | Status: DC

## 2015-08-28 MED ORDER — ALBUTEROL SULFATE (2.5 MG/3ML) 0.083% IN NEBU: 2.5000 mg | INHALATION_SOLUTION | Freq: Four times a day (QID) | RESPIRATORY_TRACT | Status: DC | PRN

## 2015-08-28 MED ORDER — PREDNISONE 10 MG PO TABS: ORAL_TABLET | ORAL | Status: DC

## 2015-08-28 NOTE — Discharge Summary (Addendum)
Physician Discharge Summary  April Ellis [id number] DOB: 1976-06-25 DOA: 08/26/2015  PCP: Delman Cheadle, MD  Admit date: 08/26/2015 Discharge date: 08/28/2015  Time spent: 45 minutes  Recommendations for Outpatient Follow-up:  Patient will be discharged to home.  Patient will need to follow up with primary care provider within one week of discharge.  Patient should continue medications as prescribed.  Patient should follow a heart healthy diet.  Resume activity as tolerated.  Discharge Diagnoses:  Acute history failure with hypoxia likely secondary to asthma exacerbation COPD Obstructive sleep apnea  Iron deficiency anemia Morbid obesity  Discharge Condition: Stable  Diet recommendation: heart healthy  Filed Weights   08/27/15 0301  Weight: 181.8 kg (400 lb 12.7 oz)    History of present illness:  on 08/27/2015 by Dr. Jerrye Beavers is a 39 y.o. female with PMH of COPD, asthma, iron deficiency anemia, obesity, OSA, who presents with shortness of breath and wheezing. Patient reports in the past 2 days, she has worsening shortness of breath and wheezing. She does not have cough or chest pain. No fever or chills. No runny nose or sore throat. Patient does not have abdominal pain, diarrhea, symptoms of UTI or unilateral weakness. In ED, patient was found to have oxygen desaturation to 86% on room air, tachycardia, tachypnea, normal temperature. Chest x-ray showed slight pulmonary vascular congestion, no infiltration. Pending CMP and cbc. Patient submitted to inpatient for further evaluation and treatment.  Hospital Course:  Acute respiratory failure with hypoxia likely secondary to asthma exacerbation -Patient did have mild wheezing on exam -Chest x-ray -Patient was given 2 g of magnesium sulfate emergency department as well as nebulizer treatments. Upon ambulation, oxygenation saturations dropped into the 80s -Continue Solu-Medrol, azithromycin, nebulizer  treatments, supplemental oxygen, Mucinex -Today, patient's oxygen saturations with ambulation ranged from 89-95% on room air.   COPD -Patient has no productive cough at this time currently compensated  Obstructive sleep apnea -Continue CPAP QHS  Iron deficiency anemia -Baseline hemoglobin 10-11, currently 11.5 -Continue monitor CBC  Morbid obesity -BMI 75.9 -Upon discharge, patient will need follow-up with primary care physician to discuss lifestyle modifications including diet and exercise.  Procedures  None  Consults  None  Discharge Exam: Filed Vitals:   08/28/15 0521 08/28/15 0846  BP: 110/53   Pulse: 61 82  Temp: 98.1 F (36.7 C)   Resp: 20 16     General: Well developed, well nourished, NAD  HEENT: NCAT, mucous membranes moist.  Cardiovascular: S1 S2 auscultated, no rubs, murmurs or gallops. Regular rate and rhythm.  Respiratory: Clear to auscultation bilaterally, no wheezing  Abdomen: Soft, obese, nontender, nondistended, + bowel sounds  Extremities: warm dry without cyanosis clubbing or edema  Neuro: AAOx3, nonfocal  Psych: Normal affect and demeanor with intact judgement and insight  Discharge Instructions      Discharge Instructions    Discharge instructions    Complete by:  As directed   Patient will be discharged to home.  Patient will need to follow up with primary care provider within one week of discharge.  Patient should continue medications as prescribed.  Patient should follow a heart healthy diet.  Resume activity as tolerated.            Medication List    TAKE these medications        albuterol (2.5 MG/3ML) 0.083% nebulizer solution  Commonly known as:  PROVENTIL  Take 3 mLs (2.5 mg total) by nebulization every 6 (six) hours as  needed for wheezing or shortness of breath.     azithromycin 250 MG tablet  Commonly known as:  ZITHROMAX  Take 1 tablet (250 mg total) by mouth daily.     Fluticasone-Salmeterol 250-50 MCG/DOSE  Aepb  Commonly known as:  ADVAIR DISKUS  Inhale 1 puff into the lungs 2 (two) times daily.     ibuprofen 800 MG tablet  Commonly known as:  ADVIL,MOTRIN  Take 1 tablet (800 mg total) by mouth 3 (three) times daily.     ipratropium 0.02 % nebulizer solution  Commonly known as:  ATROVENT  Take 2.5 mLs (0.5 mg total) by nebulization 4 (four) times daily.     IRON PO  Take 1 tablet by mouth daily.     predniSONE 10 MG tablet  Commonly known as:  DELTASONE  Take 30mg  (3 tabs) x 2 days, then 20mg  (2 tabs) x 2 days, then 10mg  (1 tab) x 2ays       No Known Allergies Follow-up Information    Follow up with SHAW,EVA, MD In 1 week.   Specialty:  Family Medicine   Why:  Hospital follow up   Contact information:   [place] [id number] [phone]       Follow up with [place] On 09/10/2015.   Specialty:  Internal Medicine   Why:  at 2:30pm for a hospital follow up appointment with Dr Lisette Grinder information:   [id number] [place] [phone]       The results of significant diagnostics from this hospitalization (including imaging, microbiology, ancillary and laboratory) are listed below for reference.    Significant Diagnostic Studies: Dg Chest 2 View  08/12/2015  CLINICAL DATA:  Increasing shortness of breath over 4 days, history asthma, COPD, former smoker EXAM: CHEST  2 VIEW COMPARISON:  04/29/2015 FINDINGS: Enlargement of cardiac silhouette. Slight pulmonary vascular congestion. Mediastinal contours normal. Eventration RIGHT diaphragm stable. No acute infiltrate, pleural effusion or pneumothorax. Bones unremarkable. IMPRESSION: Enlargement of cardiac silhouette with slight pulmonary vascular congestion. No acute infiltrate. Electronically Signed   By: Lavonia Dana M.D.   On: 08/12/2015 12:16   Dg Chest Port 1 View  08/27/2015  CLINICAL DATA:  Shortness of breath, asthma  exacerbation EXAM: PORTABLE CHEST 1 VIEW COMPARISON:  Eleven/ 15/16 FINDINGS: Cardiomediastinal silhouette is stable. No acute infiltrate or pleural effusion. No pulmonary edema. Bony thorax is unremarkable. IMPRESSION: No active disease. Electronically Signed   By: Lahoma Crocker M.D.   On: 08/27/2015 12:23    Microbiology: No results found for this or any previous visit (from the past 240 hour(s)).   Labs: Basic Metabolic Panel:  Recent Labs Lab 08/27/15 0520 08/28/15 0537  NA 134* 136  K 4.5 4.8  CL 101 102  CO2 25 29  GLUCOSE 290* 179*  BUN 9 15  CREATININE 0.87 0.76  CALCIUM 8.8* 9.1   Liver Function Tests: No results for input(s): AST, ALT, ALKPHOS, BILITOT, PROT, ALBUMIN in the last 168 hours. No results for input(s): LIPASE, AMYLASE in the last 168 hours. No results for input(s): AMMONIA in the last 168 hours. CBC:  Recent Labs Lab 08/27/15 0520  WBC 13.7*  HGB 11.5*  HCT 38.3  MCV 81.5  PLT 245   Cardiac Enzymes: No results for input(s): CKTOTAL, CKMB, CKMBINDEX, TROPONINI in the last 168 hours. BNP: BNP (last 3 results) No results for input(s): BNP in the last 8760 hours.  ProBNP (last 3 results) No results for input(s): PROBNP in the last 8760 hours.  CBG: No results for input(s): GLUCAP in the last 168 hours.  SignedCristal Ford  [facility] 08/28/2015, 9:53 AM

## 2015-08-28 NOTE — Care Management Note (Signed)
Case Management Note  Patient Details  Name: April Ellis MRN: [id number] Date of Birth: [birth year]  Subjective/Objective: Noted no home 02 needed, no orders.   Patient will go to [facility] for pcp-appt set, & pharmacy for meds. Patient voiced understanding.               Action/Plan:d/c home no needs or orders.   Expected Discharge Date:                  Expected Discharge Plan:  Home/Self Care  In-House Referral:     Discharge planning Services  CM Consult, Medication Assistance  Post Acute Care Choice:    Choice offered to:     DME Arranged:    DME Agency:     HH Arranged:    [place] Agency:     Status of Service:  Completed, signed off  Medicare Important Message Given:    Date Medicare IM Given:    Medicare IM give by:    Date Additional Medicare IM Given:    Additional Medicare Important Message give by:     If discussed at [place] of Stay Meetings, dates discussed:    Additional Comments:  Dessa Phi, RN 08/28/2015, 12:04 PM

## 2015-08-28 NOTE — Progress Notes (Signed)
Patient discharged home around 1330 this shift. Discharge paperwork explained & pt verbalized understanding. Printed prescriptions given to patient. VS stable- patient A&O upon D/C. Respirations even & unlabored, no S/S of distress. All belongings sent with patient. Sister provided transportation. Patient assisted to sister's vehicle by W/C provided by RN.

## 2015-09-10 ENCOUNTER — Encounter (HOSPITAL_COMMUNITY): Payer: Self-pay | Admitting: Emergency Medicine

## 2015-09-10 ENCOUNTER — Inpatient Hospital Stay: Payer: No Typology Code available for payment source | Admitting: Family Medicine

## 2015-09-10 ENCOUNTER — Emergency Department (HOSPITAL_COMMUNITY)
Admission: EM | Admit: 2015-09-10 | Discharge: 2015-09-10 | Disposition: A | Discharge status: Home / Self Care | Payer: No Typology Code available for payment source | Attending: Emergency Medicine | Admitting: Emergency Medicine

## 2015-09-10 ENCOUNTER — Emergency Department (HOSPITAL_COMMUNITY): Payer: No Typology Code available for payment source

## 2015-09-10 DIAGNOSIS — Z87891 Personal history of nicotine dependence: Secondary | ICD-10-CM | POA: Insufficient documentation

## 2015-09-10 DIAGNOSIS — Z7952 Long term (current) use of systemic steroids: Secondary | ICD-10-CM | POA: Insufficient documentation

## 2015-09-10 DIAGNOSIS — Z79899 Other long term (current) drug therapy: Secondary | ICD-10-CM | POA: Insufficient documentation

## 2015-09-10 DIAGNOSIS — J45901 Unspecified asthma with (acute) exacerbation: Secondary | ICD-10-CM

## 2015-09-10 DIAGNOSIS — D649 Anemia, unspecified: Secondary | ICD-10-CM | POA: Insufficient documentation

## 2015-09-10 DIAGNOSIS — J441 Chronic obstructive pulmonary disease with (acute) exacerbation: Secondary | ICD-10-CM | POA: Insufficient documentation

## 2015-09-10 DIAGNOSIS — E669 Obesity, unspecified: Secondary | ICD-10-CM | POA: Insufficient documentation

## 2015-09-10 MED ORDER — ALBUTEROL SULFATE HFA 108 (90 BASE) MCG/ACT IN AERS
2.0000 | INHALATION_SPRAY | RESPIRATORY_TRACT | Status: DC
  Administered 2015-09-10: 2 via RESPIRATORY_TRACT
  Filled 2015-09-10: qty 6.7

## 2015-09-10 MED ORDER — ALBUTEROL SULFATE (2.5 MG/3ML) 0.083% IN NEBU
5.0000 mg | INHALATION_SOLUTION | Freq: Once | RESPIRATORY_TRACT | Status: AC
  Administered 2015-09-10: 5 mg via RESPIRATORY_TRACT
  Filled 2015-09-10: qty 6

## 2015-09-10 MED ORDER — PREDNISONE 20 MG PO TABS
60.0000 mg | ORAL_TABLET | Freq: Once | ORAL | Status: AC
  Administered 2015-09-10: 60 mg via ORAL
  Filled 2015-09-10: qty 3

## 2015-09-10 MED ORDER — PREDNISONE 10 MG PO TABS: 60.0000 mg | ORAL_TABLET | Freq: Every day | ORAL | Status: DC

## 2015-09-10 MED ORDER — IPRATROPIUM BROMIDE 0.02 % IN SOLN
0.5000 mg | Freq: Once | RESPIRATORY_TRACT | Status: AC
  Administered 2015-09-10: 0.5 mg via RESPIRATORY_TRACT
  Filled 2015-09-10: qty 2.5

## 2015-09-10 NOTE — Discharge Instructions (Signed)

## 2015-09-10 NOTE — Progress Notes (Addendum)
Pt is aware of goodrx to assist with cost of meds Pt aware of her 09/12/15 appt  Cm noted ED RN note stating pt without meds for 2 weeks   CM discussed with pt that she could have called [place] [facility] or [facility] pharmacy to get her medications  ENCouraged pt to contact [facility] in the future for assistance with medication vs not obtaining them Pt voiced understanding Teach back method used     Entered in d/c instructions Amao, Enobong Go on 09/12/2015 Our comuputer indicates you have an already scheduled appt for 1200 09/12/15 Please attend your appointment [place] [id number] [phone]   CM discussed and provided written information for uninsured accepting pcps, discussed the importance of pcp vs EDP services for f/u care, www.needymeds.org, www.goodrx.com, discounted pharmacies and other [employer] such as [employer], affordable care act, financial assistance, uninsured dental services, [address] med assist, DSS and  health department  Reviewed resources for [employer] uninsured accepting pcps like [facility], family medicine at [employer], [facility], [facility], local urgent care centers, [facility], [facility] family practice, general medical clinics, family services of the [place], [facility] urgent care plus others, medication resources, [facility] out patient pharmacies and housing Pt voiced understanding and appreciation of resources provided   Pt is aware of goodrx to assist with cost of meds Pt aware of her 09/12/15 appt  Cm noted ED RN note stating pt without meds for 2 weeks   CM discussed with pt that she could have called [place] [address] [facility] or [facility] pharmacy to get her medications  ENCouraged pt to contact [facility] in the future for assistance with medication vs not obtaining them Pt voiced understanding Teach back method used

## 2015-09-10 NOTE — ED Notes (Signed)
Pt states that she has had increased SOB over the past 2 days.  Upon getting pt into bed and undressed, her room air sat was in the high 70s.  Pt w/ hx of asthma and has been without her inhaler x 2 wks.  Has been without her long term control meds x several months.

## 2015-09-10 NOTE — ED Provider Notes (Signed)
CSN: [id number]     Arrival date & time 09/10/15  1342 History   First MD Initiated Contact with Patient 09/10/15 1346     Chief Complaint  Patient presents with  . Shortness of Breath    HPI Patient for shortness of breath over the past several days.  She has a history of asthma.  She was recently admitted for an asthma exacerbation but when she was discharged she was discharged with a prescription for Ultram oral inhaler which she was not able to afford.  She has not taken any for dilator treatments at home secondary to this.  She denies fevers or chills.  No productive cough.  Denies chest pain.  No abdominal pain.  Symptoms are mild to moderate in severity.   Past Medical History  Diagnosis Date  . Anemia   . Bronchitis   . Asthma   . Obesity   . COPD (chronic obstructive pulmonary disease) [facility])    Past Surgical History  Procedure Laterality Date  . No past surgeries    . Dental surgery      2 teeth pulled, 11/22/14   Family History  Problem Relation Age of Onset  . Diabetes Mother   . Diabetes Sister   . Hypertension Sister   . Hypertension Sister    Social History  Substance Use Topics  . Smoking status: Former Smoker -- 0.25 packs/day for 15 years    Types: Cigarettes    Quit date: 09/16/2014  . Smokeless tobacco: Never Used  . Alcohol Use: No   OB History    No data available     Review of Systems  All other systems reviewed and are negative.     Allergies  Review of patient's allergies indicates no known allergies.  Home Medications   Prior to Admission medications   Medication Sig Start Date End Date Taking? Authorizing Provider  albuterol (PROVENTIL) (2.5 MG/3ML) 0.083% nebulizer solution Take 3 mLs (2.5 mg total) by nebulization every 6 (six) hours as needed for wheezing or shortness of breath. 08/28/15  Yes Maryann Mikhail, DO  ipratropium (ATROVENT) 0.02 % nebulizer solution Take 2.5 mLs (0.5 mg total) by nebulization 4 (four) times daily.  08/28/15  Yes Maryann Mikhail, DO  IRON PO Take 1 tablet by mouth daily.   Yes Historical Provider, MD  montelukast (SINGULAIR) 10 MG tablet Take 10 mg by mouth at bedtime.   Yes Historical Provider, MD  Fluticasone-Salmeterol (ADVAIR DISKUS) 250-50 MCG/DOSE AEPB Inhale 1 puff into the lungs 2 (two) times daily. Patient not taking: Reported on 06/16/2015 11/18/14   Shawnee Knapp, MD  predniSONE (DELTASONE) 10 MG tablet Take 6 tablets (60 mg total) by mouth daily. 09/10/15   Jola Schmidt, MD   BP 118/71 mmHg  Pulse 97  Temp(Src) 98.3 F (36.8 C) (Oral)  Resp 15  SpO2 93%  LMP 09/03/2015 Physical Exam  Constitutional: She is oriented to person, place, and time. She appears well-developed and well-nourished. No distress.  HENT:  Head: Normocephalic and atraumatic.  Eyes: EOM are normal.  Neck: Normal range of motion.  Cardiovascular: Normal rate, regular rhythm and normal heart sounds.   Pulmonary/Chest: Effort normal. She has wheezes.  Abdominal: Soft. She exhibits no distension. There is no tenderness.  Musculoskeletal: Normal range of motion.  Neurological: She is alert and oriented to person, place, and time.  Skin: Skin is warm and dry.  Psychiatric: She has a normal mood and affect. Judgment normal.  Nursing note and vitals  reviewed.   ED Course  Procedures (including critical care time) Labs Review Labs Reviewed - No data to display  Imaging Review Dg Chest 2 View  09/10/2015  CLINICAL DATA:  Shortness of breath for 2 days EXAM: CHEST  2 VIEW COMPARISON:  August 27, 2015 FINDINGS: There is no edema or consolidation. The heart size and pulmonary vascularity are normal. No adenopathy. No pneumothorax. No bone lesions. IMPRESSION: No edema or consolidation. Electronically Signed   By: Lowella Grip III M.D.   On: 09/10/2015 14:35   I have personally reviewed and evaluated these images and lab results as part of my medical decision-making.   EKG Interpretation None       MDM   Final diagnoses:  Asthma exacerbation    Patient feels much better after albuterol emergency department.  Home with a short course of steroids.  Likely mild asthma exacerbation.  Chest x-ray is clear.  Patient was sent home with an inhaler in hand.  She understands to return to the ER for new or worsening symptoms.    Jola Schmidt, MD 09/10/15 [phone]

## 2015-09-12 ENCOUNTER — Ambulatory Visit: Payer: No Typology Code available for payment source | Attending: Family Medicine | Admitting: Physician Assistant

## 2015-09-12 ENCOUNTER — Inpatient Hospital Stay: Payer: No Typology Code available for payment source

## 2015-09-12 VITALS — BP 103/65 | HR 83 | Temp 97.9°F | Resp 16 | Ht 62.5 in | Wt >= 6400 oz

## 2015-09-12 DIAGNOSIS — Z79899 Other long term (current) drug therapy: Secondary | ICD-10-CM | POA: Insufficient documentation

## 2015-09-12 DIAGNOSIS — J449 Chronic obstructive pulmonary disease, unspecified: Secondary | ICD-10-CM | POA: Insufficient documentation

## 2015-09-12 DIAGNOSIS — J4541 Moderate persistent asthma with (acute) exacerbation: Secondary | ICD-10-CM

## 2015-09-12 DIAGNOSIS — G4733 Obstructive sleep apnea (adult) (pediatric): Secondary | ICD-10-CM | POA: Insufficient documentation

## 2015-09-12 DIAGNOSIS — Z6841 Body Mass Index (BMI) 40.0 and over, adult: Secondary | ICD-10-CM | POA: Insufficient documentation

## 2015-09-12 MED ORDER — IPRATROPIUM BROMIDE 0.02 % IN SOLN: 0.5000 mg | Freq: Four times a day (QID) | RESPIRATORY_TRACT | Status: DC

## 2015-09-12 MED ORDER — ALBUTEROL SULFATE HFA 108 (90 BASE) MCG/ACT IN AERS: 2.0000 | INHALATION_SPRAY | Freq: Four times a day (QID) | RESPIRATORY_TRACT | Status: DC | PRN

## 2015-09-12 MED ORDER — ALBUTEROL SULFATE (2.5 MG/3ML) 0.083% IN NEBU: 2.5000 mg | INHALATION_SOLUTION | Freq: Four times a day (QID) | RESPIRATORY_TRACT | Status: DC | PRN

## 2015-09-12 MED ORDER — BUDESONIDE-FORMOTEROL FUMARATE 160-4.5 MCG/ACT IN AERO: 2.0000 | INHALATION_SPRAY | Freq: Two times a day (BID) | RESPIRATORY_TRACT | Status: DC

## 2015-09-12 MED ORDER — MONTELUKAST SODIUM 10 MG PO TABS: 10.0000 mg | ORAL_TABLET | Freq: Every day | ORAL | Status: DC

## 2015-09-12 NOTE — Progress Notes (Signed)
April Ellis  [id number]  [id number]  DOB - 12/06/1975  Chief Complaint  Patient presents with  . Hospitalization Follow-up  . COPD       Subjective:   April Ellis is a 39 y.o. female here today for establishment of care. She was hospitalized from 08/27/2015 through 08/28/2015 for an asthma exacerbation. She presented to the emergency department with increasing shortness of breath and increasing wheezing for 2 days prior. She did not have a cough. She did not have chest pain. She denied a fever. She was hypoxic with an O2 saturation of 86%. She was tachycardic and tachypnea. Her chest x-ray showed some pulmonary vascular congestion. She was admitted by the internal medicine team and started on IV steroids, antibiotics, Mucinex and nebulization treatments. She was discharged after 2 days and maintenance medications were prescribed. She filled her steroid and antibiotic but not her maintenance medications secondary to cost. 2 weeks later she re-presented to the hospital with increasing shortness of breath and an asthma exacerbation all over again. She was given a dose of steroids and nebs and felt better and did not have to be admitted.  She is not suffering from an exacerbation today. She is requesting her maintenance medications. She denies chest pain or shortness of breath. She denies wheezing. She taking her course of steroids and antibiotics.    ROS: GEN: denies fever or chills, denies change in weight Skin: denies lesions or rashes HEENT: denies headache, earache, epistaxis, sore throat, or neck pain LUNGS: denies SHOB, dyspnea, PND, orthopnea CV: denies CP or palpitations EXT: denies muscle spasms or swelling; no pain in lower ext, no weakness NEURO: denies numbness or tingling, denies sz, stroke or TIA  ALLERGIES: No Known Allergies  PAST MEDICAL HISTORY: Past Medical History  Diagnosis Date  . Anemia   . Bronchitis   . Asthma   . Obesity   . COPD (chronic  obstructive pulmonary disease) ([place])     PAST SURGICAL HISTORY: Past Surgical History  Procedure Laterality Date  . No past surgeries    . Dental surgery      2 teeth pulled, 11/22/14    MEDICATIONS AT HOME: Prior to Admission medications   Medication Sig Start Date End Date Taking? Authorizing Provider  albuterol (PROVENTIL) (2.5 MG/3ML) 0.083% nebulizer solution Take 3 mLs (2.5 mg total) by nebulization every 6 (six) hours as needed for wheezing or shortness of breath. 09/12/15  Yes Roselina Burgueno Daneil Dan, PA-C  ipratropium (ATROVENT) 0.02 % nebulizer solution Take 2.5 mLs (0.5 mg total) by nebulization 4 (four) times daily. 09/12/15  Yes Cadience Bradfield Daneil Dan, PA-C  IRON PO Take 1 tablet by mouth daily.   Yes Historical Provider, MD  montelukast (SINGULAIR) 10 MG tablet Take 1 tablet (10 mg total) by mouth at bedtime. 09/12/15  Yes Mancil Pfenning Daneil Dan, PA-C  predniSONE (DELTASONE) 10 MG tablet Take 6 tablets (60 mg total) by mouth daily. 09/10/15  Yes Jola Schmidt, MD  albuterol (PROVENTIL HFA;VENTOLIN HFA) 108 (90 BASE) MCG/ACT inhaler Inhale 2 puffs into the lungs every 6 (six) hours as needed for wheezing or shortness of breath. 09/12/15   Armas Mcbee Daneil Dan, PA-C  budesonide-formoterol (SYMBICORT) 160-4.5 MCG/ACT inhaler Inhale 2 puffs into the lungs 2 (two) times daily. 09/12/15   Brayton Caves, PA-C     Objective:   Filed Vitals:   09/12/15 1200  BP: 103/65  Pulse: 83  Temp: 97.9 F (36.6 C)  TempSrc: Oral  Resp: 16  Height: 5' 2.5" (  1.588 m)  Weight: 412 lb (186.882 kg)  SpO2: 96%    Exam General appearance : Awake, alert, not in any distress. Speech Clear. Not toxic looking HEENT: Atraumatic and Normocephalic, pupils equally reactive to light and accomodation Neck: supple, no JVD. No cervical lymphadenopathy.  Chest:Good air entry bilaterally, no added sounds  CVS: S1 S2 regular, no murmurs.  Abdomen: Bowel sounds present, Non tender and not distended with no gaurding, rigidity or  rebound. Extremities: B/L Lower Ext shows no edema, both legs are warm to touch Neurology: Awake alert, and oriented X 3, CN II-XII intact, Non focal Skin:No Rash Wounds:N/A  Data Review Lab Results  Component Value Date   HGBA1C 5.4 04/28/2015   HGBA1C 5.0 08/14/2014     Assessment & Plan  1. Asthma exacerbation  -refilled Albuterol MDI and nebs and Singulair  -Start Symbicort  -avoid triggers  -complete ABX and steroids 2. Morbid obesity 3. OSA  -on CPAP    Return in about 6 weeks (around 10/24/2015). For routine health maintenance.  The patient was given clear instructions to go to ER or return to medical center if symptoms don't improve, worsen or new problems develop. The patient verbalized understanding. The patient was told to call to get lab results if they haven't heard anything in the next week.   This note has been created with Surveyor, quantity. Any transcriptional errors are unintentional.    Zettie Pho, PA-C [facility] [place], [place]   09/12/2015, 12:41 PM

## 2015-09-12 NOTE — Addendum Note (Signed)
Addended by: Wonda Olds L on: 09/12/2015 12:47 PM   Modules accepted: Miquel Dunn

## 2015-09-12 NOTE — Progress Notes (Signed)
Patient's here for hospital f/up for COPD. Patient was back in hospital on Wednesday for same problems.  Patient reports that her nebulizer tx is not working for her on the return visit to ED on Wednesday. Patient denies any tightness in chest today.   Patient having one sided numbness to left side of her face for a month now.  Patient denies any pain today. Patient requesting refills on medications.

## 2015-09-19 ENCOUNTER — Emergency Department
Admission: EM | Admit: 2015-09-19 | Discharge: 2015-09-19 | Disposition: A | Discharge status: Home / Self Care | Payer: No Typology Code available for payment source | Attending: Emergency Medicine | Admitting: Emergency Medicine

## 2015-09-19 ENCOUNTER — Emergency Department: Payer: Self-pay

## 2015-09-19 DIAGNOSIS — Z7951 Long term (current) use of inhaled steroids: Secondary | ICD-10-CM | POA: Insufficient documentation

## 2015-09-19 DIAGNOSIS — Z87891 Personal history of nicotine dependence: Secondary | ICD-10-CM | POA: Insufficient documentation

## 2015-09-19 DIAGNOSIS — G500 Trigeminal neuralgia: Secondary | ICD-10-CM | POA: Insufficient documentation

## 2015-09-19 DIAGNOSIS — Z7952 Long term (current) use of systemic steroids: Secondary | ICD-10-CM | POA: Insufficient documentation

## 2015-09-19 DIAGNOSIS — Z79899 Other long term (current) drug therapy: Secondary | ICD-10-CM | POA: Insufficient documentation

## 2015-09-19 LAB — CBC
HEMATOCRIT: 38.1 % (ref 35.0–47.0)
HEMOGLOBIN: 12 g/dL (ref 12.0–16.0)
MCH: 24.2 pg — AB (ref 26.0–34.0)
MCHC: 31.5 g/dL — AB (ref 32.0–36.0)
MCV: 77 fL — ABNORMAL LOW (ref 80.0–100.0)
Platelets: 311 10*3/uL (ref 150–440)
RBC: 4.95 MIL/uL (ref 3.80–5.20)
RDW: 16.7 % — AB (ref 11.5–14.5)
WBC: 16 10*3/uL — ABNORMAL HIGH (ref 3.6–11.0)

## 2015-09-19 LAB — COMPREHENSIVE METABOLIC PANEL
ALBUMIN: 3.7 g/dL (ref 3.5–5.0)
ALK PHOS: 96 U/L (ref 38–126)
ALT: 16 U/L (ref 14–54)
ANION GAP: 7 (ref 5–15)
AST: 13 U/L — ABNORMAL LOW (ref 15–41)
BILIRUBIN TOTAL: 0.8 mg/dL (ref 0.3–1.2)
BUN: 12 mg/dL (ref 6–20)
CALCIUM: 9.2 mg/dL (ref 8.9–10.3)
CO2: 29 mmol/L (ref 22–32)
Chloride: 100 mmol/L — ABNORMAL LOW (ref 101–111)
Creatinine, Ser: 0.72 mg/dL (ref 0.44–1.00)
GFR calc non Af Amer: >60 mL/min (ref >60)
Glucose, Bld: 100 mg/dL — ABNORMAL HIGH (ref 65–99)
POTASSIUM: 3.9 mmol/L (ref 3.5–5.1)
Sodium: 136 mmol/L (ref 135–145)
TOTAL PROTEIN: 7.9 g/dL (ref 6.5–8.1)

## 2015-09-19 MED ORDER — CARBAMAZEPINE ER 200 MG PO TB12: 200.0000 mg | ORAL_TABLET | Freq: Two times a day (BID) | ORAL | Status: DC

## 2015-09-19 NOTE — ED Notes (Signed)
Patient has experienced this before right before Thanksgiving. Today while traveling,left side of face became numb.

## 2015-09-19 NOTE — Discharge Instructions (Signed)
Please begin taking her medication tomorrow morning. Please follow up your primary care physician the next 2-3 days for recheck/reevaluation. Please follow up with ENT by calling The number provided for further evaluation. Return to the emergency department for any worsening symptoms, weakness or numbness of any arm or leg, confusion or slurred speech, or any other symptom personally concerning to your self.   Trigeminal Neuralgia Trigeminal neuralgia is a nerve disorder that causes attacks of severe facial pain. The attacks last from a few seconds to several minutes. They can happen for days, weeks, or months and then go away for months or years. Trigeminal neuralgia is also called tic douloureux. CAUSES This condition is caused by damage to a nerve in the face that is called the trigeminal nerve. An attack can be triggered by:  Talking.  Chewing.  Putting on makeup.  Washing your face.  Shaving your face.  Brushing your teeth.  Touching your face. RISK FACTORS This condition is more likely to develop in:  Women.  People who are 5 years of age or older. SYMPTOMS The main symptom of this condition is pain in the jaw, lips, eyes, nose, scalp, forehead, and face. The pain may be intense, stabbing, electric, or shock-like. DIAGNOSIS This condition is diagnosed with a physical exam. A CT scan or MRI may be done to rule out other conditions that can cause facial pain. TREATMENT This condition may be treated with:  Avoiding the things that trigger your attacks.  Pain medicine.  Surgery. This may be done in severe cases if other medical treatment does not provide relief. HOME CARE INSTRUCTIONS  Take over-the-counter and prescription medicines only as told by your health care provider.  If you wish to get pregnant, talk with your health care provider before you start trying to get pregnant.  Avoid the things that trigger your attacks. It may help to:  Chew on the unaffected  side of your mouth.  Avoid touching your face.  Avoid blasts of hot or cold air. SEEK MEDICAL CARE IF:  Your pain medicine is not helping.  You develop new, unexplained symptoms, such as:  Double vision.  Facial weakness.  Changes in hearing or balance.  You become pregnant. SEEK IMMEDIATE MEDICAL CARE IF:  Your pain is unbearable, and your pain medicine does not help.   This information is not intended to replace advice given to you by your health care provider. Make sure you discuss any questions you have with your health care provider.   Document Released: 09/10/2000 Document Revised: 06/04/2015 Document Reviewed: 01/06/2015 Elsevier Interactive Patient Education [employer].

## 2015-09-19 NOTE — ED Notes (Signed)
Patient passed swallow study with no problem but states painful to chew. 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

## 2015-09-19 NOTE — ED Provider Notes (Addendum)
[facility] Emergency Department Provider Note  Time seen: 8:29 PM  I have reviewed the triage vital signs and the nursing notes.   HISTORY  Chief Complaint Numbness    HPI April Ellis is a 39 y.o. female with a past medical history of morbid obesity, asthma, COPD, who presents to the emergency department with left facial numbness. According to the patient for the past one month she has intermittently had left facial numbness. She states it lasts for seconds sometimes minutes. She describes the symptoms onset typically when she is chewing she describes a sharp pain which presents over the entire left face followed by a tingling and numbness sensation which will last seconds to minutes. Tonight she states she was not chewing or doing anything involving her facial muscles when she had sudden onset of left facial numbness along with pain. It lasted approximately 20 or 30 minutes and then resolved. Upon evaluation in the emergency department she denies any symptoms at this time however upon chewing a cracker she states the symptoms returned and lasted several minutes before resolving. Denies ever having focal weakness or numbness of any arm or leg, denies confusion or slurred speech. She states during the episode today where she felt numb in her face she smiled at a family member and they stated everything looked normal, denied any facial droop.     Past Medical History  Diagnosis Date  . Anemia   . Bronchitis   . Asthma   . Obesity   . COPD (chronic obstructive pulmonary disease) [facility])     Patient Active Problem List   Diagnosis Date Noted  . Status asthmaticus 12/01/2014  . Respiratory distress   . Asthma exacerbation 09/30/2014  . Viral URI 09/30/2014  . Sinus tachycardia ([place]) 09/30/2014  . Leukocytosis 09/30/2014  . Allergic reaction   . Iron deficiency anemia 08/14/2014  . Episodic tobacco abuse 08/14/2014  . Acute bronchitis 08/14/2014  . Moderate  persistent asthma with acute exacerbation in adult 08/13/2014  . SOB (shortness of breath) 07/25/2014  . Atypical chest pain 06/27/2014  . COPD exacerbation ([place]) 05/12/2014  . Acute respiratory failure with hypoxia ([place]) 05/12/2014  . Tobacco abuse 05/12/2014  . OSA (obstructive sleep apnea) 05/03/2014  . COPD (chronic obstructive pulmonary disease) ([place]) 05/01/2014  . Shortness of breath 04/01/2014  . Morbid obesity ([place]) 12/19/2008  . ELEVATED BLOOD PRESSURE WITHOUT DIAGNOSIS OF HYPERTENSION 12/19/2008    Past Surgical History  Procedure Laterality Date  . No past surgeries    . Dental surgery      2 teeth pulled, 11/22/14    Current Outpatient Rx  Name  Route  Sig  Dispense  Refill  . albuterol (PROVENTIL HFA;VENTOLIN HFA) 108 (90 BASE) MCG/ACT inhaler   Inhalation   Inhale 2 puffs into the lungs every 6 (six) hours as needed for wheezing or shortness of breath.   1 Inhaler   3   . albuterol (PROVENTIL) (2.5 MG/3ML) 0.083% nebulizer solution   Nebulization   Take 3 mLs (2.5 mg total) by nebulization every 6 (six) hours as needed for wheezing or shortness of breath.   75 mL   3   . budesonide-formoterol (SYMBICORT) 160-4.5 MCG/ACT inhaler   Inhalation   Inhale 2 puffs into the lungs 2 (two) times daily.   1 Inhaler   3   . ipratropium (ATROVENT) 0.02 % nebulizer solution   Nebulization   Take 2.5 mLs (0.5 mg total) by nebulization 4 (four) times  daily.   75 mL   3   . IRON PO   Oral   Take 1 tablet by mouth daily.         . montelukast (SINGULAIR) 10 MG tablet   Oral   Take 1 tablet (10 mg total) by mouth at bedtime.   30 tablet   3   . predniSONE (DELTASONE) 10 MG tablet   Oral   Take 6 tablets (60 mg total) by mouth daily.   30 tablet   0     Allergies Review of patient's allergies indicates no known allergies.  Family History  Problem Relation Age of Onset  . Diabetes Mother   . Diabetes Sister   . Hypertension Sister   . Hypertension  Sister     Social History Social History  Substance Use Topics  . Smoking status: Former Smoker -- 0.25 packs/day for 15 years    Types: Cigarettes    Quit date: 09/16/2014  . Smokeless tobacco: Never Used  . Alcohol Use: No    Review of Systems Constitutional: Negative for fever. Cardiovascular: Negative for chest pain. Respiratory: Negative for shortness of breath. Gastrointestinal: Negative for abdominal pain Neurological: Negative for . Positive for left facial numbness/pain intermittent. 10-point ROS otherwise negative.  ____________________________________________   PHYSICAL EXAM:  VITAL SIGNS: ED Triage Vitals  Enc Vitals Group     BP 09/19/15 1956 128/62 mmHg     Pulse Rate 09/19/15 1956 78     Resp 09/19/15 1956 14     Temp 09/19/15 1956 97.7 F (36.5 C)     Temp Source 09/19/15 1956 Oral     SpO2 09/19/15 1956 100 %     Weight 09/19/15 1956 400 lb (181.439 kg)     Height 09/19/15 1956 5\' 2"  (1.575 m)     Head Cir --      Peak Flow --      Pain Score 09/19/15 1957 0     Pain Loc --      Pain Edu? --      Excl. in [place]? --     Constitutional: Alert and oriented. Well appearing and in no distress. Eyes: Normal exam ENT   Head: Normocephalic and atraumatic   Mouth/Throat: Mucous membranes are moist. Cardiovascular: Normal rate, regular rhythm. No murmur Respiratory: Normal respiratory effort without tachypnea nor retractions. Breath sounds are clear  Gastrointestinal: Soft and nontender. No distention.  Musculoskeletal: Nontender with normal range of motion in all extremities. Neurologic:  Normal speech and language. No gross focal neurologic deficits are appreciated. Cranial nerves intact, equal grip strengths, 5/5 motor in all extremities, no pronator drift. Skin:  Skin is warm, dry and intact.  Psychiatric: Mood and affect are normal. Speech and behavior are normal.  ____________________________________________  EKG reviewed and interpreted by  myself shows normal sinus rhythm at 77 bpm, narrow QRS, normal axis, normal intervals, no ST changes. Overall normal EKG.   INITIAL IMPRESSION / ASSESSMENT AND PLAN / ED COURSE  Pertinent labs & imaging results that were available during my care of the patient were reviewed by me and considered in my medical decision making (see chart for details).  Patient presents for left facial numbness and pain. States is intermittent and mostly when chewing food. States tonight she was not doing anything and it lasted much longer than normal. Her symptoms sound most suggestive of either TMJ or trigeminal neuralgia, more likely trigeminal neuralgia. If her workup is normal today we'll discharge the patient  on carbamazepine, and have her follow-up with ENT, as well as her PCP.  Labs are within normal limits. We'll discharge the patient on carbamazepine 200 mg daily and have her follow-up with her primary care doctor. Patient agreeable to plan.  ____________________________________________   FINAL CLINICAL IMPRESSION(S) / ED DIAGNOSES   Trigeminal neuralgia   Harvest Dark, MD 09/19/15 2115  Harvest Dark, MD 09/19/15 2122

## 2015-09-29 MED FILL — VENTOLIN HFA 90 MCG INHALER: 108 (90 BAS | 25 days supply | Qty: 18 | Fill #1

## 2015-10-02 MED FILL — ?IPRATROPIUM BR 0.02% SOLN: 0.02 | 30 days supply | Qty: 188 | Fill #1

## 2015-10-02 MED FILL — ALBUTEROL 0.083% INHAL SOLN: (2.5 MG/3ML | 30 days supply | Qty: 90 | Fill #1

## 2015-10-10 ENCOUNTER — Emergency Department (HOSPITAL_COMMUNITY)
Admission: EM | Admit: 2015-10-10 | Discharge: 2015-10-10 | Disposition: A | Discharge status: Home / Self Care | Payer: No Typology Code available for payment source | Attending: Emergency Medicine | Admitting: Emergency Medicine

## 2015-10-10 ENCOUNTER — Encounter (HOSPITAL_COMMUNITY): Payer: Self-pay | Admitting: Emergency Medicine

## 2015-10-10 ENCOUNTER — Emergency Department (HOSPITAL_COMMUNITY): Payer: No Typology Code available for payment source

## 2015-10-10 DIAGNOSIS — D649 Anemia, unspecified: Secondary | ICD-10-CM | POA: Insufficient documentation

## 2015-10-10 DIAGNOSIS — Z87891 Personal history of nicotine dependence: Secondary | ICD-10-CM | POA: Insufficient documentation

## 2015-10-10 DIAGNOSIS — J441 Chronic obstructive pulmonary disease with (acute) exacerbation: Secondary | ICD-10-CM | POA: Insufficient documentation

## 2015-10-10 DIAGNOSIS — R000 Tachycardia, unspecified: Secondary | ICD-10-CM | POA: Insufficient documentation

## 2015-10-10 DIAGNOSIS — Z7952 Long term (current) use of systemic steroids: Secondary | ICD-10-CM | POA: Insufficient documentation

## 2015-10-10 DIAGNOSIS — Z79899 Other long term (current) drug therapy: Secondary | ICD-10-CM | POA: Insufficient documentation

## 2015-10-10 DIAGNOSIS — J45901 Unspecified asthma with (acute) exacerbation: Secondary | ICD-10-CM

## 2015-10-10 MED ORDER — ALBUTEROL (5 MG/ML) CONTINUOUS INHALATION SOLN
10.0000 mg/h | INHALATION_SOLUTION | Freq: Once | RESPIRATORY_TRACT | Status: AC
  Administered 2015-10-10: 10 mg/h via RESPIRATORY_TRACT
  Filled 2015-10-10: qty 20

## 2015-10-10 MED ORDER — PREDNISONE 20 MG PO TABS
60.0000 mg | ORAL_TABLET | Freq: Once | ORAL | Status: AC
  Administered 2015-10-10: 60 mg via ORAL
  Filled 2015-10-10: qty 3

## 2015-10-10 MED ORDER — PREDNISONE 20 MG PO TABS: ORAL_TABLET | ORAL | Status: AC

## 2015-10-10 MED ORDER — IPRATROPIUM BROMIDE 0.02 % IN SOLN
0.5000 mg | Freq: Once | RESPIRATORY_TRACT | Status: AC
  Administered 2015-10-10: 0.5 mg via RESPIRATORY_TRACT
  Filled 2015-10-10: qty 2.5

## 2015-10-10 MED ORDER — ALBUTEROL SULFATE (2.5 MG/3ML) 0.083% IN NEBU: 2.5000 mg | INHALATION_SOLUTION | RESPIRATORY_TRACT | Status: DC | PRN

## 2015-10-10 NOTE — Discharge Instructions (Signed)
Use your albuterol nebulizer every 4 hours for the next 48 hours. If you need more than this or if your shortness of breath worsens, return to the ER immediately. Otherwise follow up closely with your primary doctor

## 2015-10-10 NOTE — ED Notes (Signed)
Per patient, states asthma symptoms since last night-ni relief with inhaler

## 2015-10-10 NOTE — ED Provider Notes (Signed)
CSN: [id number]     Arrival date & time 10/10/15  [id number] History   First MD Initiated Contact with Patient 10/10/15 [phone]     Chief Complaint  Patient presents with  . Asthma     (Consider location/radiation/quality/duration/timing/severity/associated sxs/prior Treatment) HPI  40 year old female with a history of both asthma and COPD in her chart presents with shortness of breath. Started a couple days ago but significantly worsened last night. No relief with her albuterol nebulizer at home. Patient has some chest tightness, wheezing, and a nonproductive cough. Denies fevers. Feels similar to prior asthma exacerbations. She used to smoke but has since quit. Diagnosed with asthma in 2015.  Past Medical History  Diagnosis Date  . Anemia   . Bronchitis   . Asthma   . Obesity   . COPD (chronic obstructive pulmonary disease) [facility])    Past Surgical History  Procedure Laterality Date  . No past surgeries    . Dental surgery      2 teeth pulled, 11/22/14   Family History  Problem Relation Age of Onset  . Diabetes Mother   . Diabetes Sister   . Hypertension Sister   . Hypertension Sister    Social History  Substance Use Topics  . Smoking status: Former Smoker -- 0.25 packs/day for 15 years    Types: Cigarettes    Quit date: 09/16/2014  . Smokeless tobacco: Never Used  . Alcohol Use: No   OB History    No data available     Review of Systems  Constitutional: Negative for fever.  Respiratory: Positive for cough, chest tightness, shortness of breath and wheezing.   All other systems reviewed and are negative.     Allergies  Review of patient's allergies indicates no known allergies.  Home Medications   Prior to Admission medications   Medication Sig Start Date End Date Taking? Authorizing Provider  albuterol (PROVENTIL HFA;VENTOLIN HFA) 108 (90 BASE) MCG/ACT inhaler Inhale 2 puffs into the lungs every 6 (six) hours as needed for wheezing or shortness of breath. 09/12/15    Brayton Caves, PA-C  albuterol (PROVENTIL) (2.5 MG/3ML) 0.083% nebulizer solution Take 3 mLs (2.5 mg total) by nebulization every 6 (six) hours as needed for wheezing or shortness of breath. 09/12/15   Tiffany Daneil Dan, PA-C  carbamazepine (TEGRETOL-XR) 200 MG 12 hr tablet Take 1 tablet (200 mg total) by mouth 2 (two) times daily. 09/19/15 09/18/16  Harvest Dark, MD  ipratropium (ATROVENT) 0.02 % nebulizer solution Take 2.5 mLs (0.5 mg total) by nebulization 4 (four) times daily. 09/12/15   Brayton Caves, PA-C  IRON PO Take 1 tablet by mouth daily.    Historical Provider, MD  montelukast (SINGULAIR) 10 MG tablet Take 1 tablet (10 mg total) by mouth at bedtime. 09/12/15   Tiffany Daneil Dan, PA-C  predniSONE (DELTASONE) 10 MG tablet Take 6 tablets (60 mg total) by mouth daily. Patient taking differently: Take 20 mg by mouth daily. tapering 09/10/15   Jola Schmidt, MD   BP 151/48 mmHg  Pulse 120  Temp(Src) 97.9 F (36.6 C) (Oral)  Resp 20  SpO2 94%  LMP 10/04/2015 Physical Exam  Constitutional: She is oriented to person, place, and time. She appears well-developed and well-nourished. No distress.  morbidly obese  HENT:  Head: Normocephalic and atraumatic.  Right Ear: External ear normal.  Left Ear: External ear normal.  Nose: Nose normal.  Eyes: Right eye exhibits no discharge. Left eye exhibits no discharge.  Cardiovascular: Regular  rhythm and normal heart sounds.  Tachycardia present.   Pulmonary/Chest: Effort normal. She has wheezes.  Diffuse expiratory wheezes  Abdominal: Soft. There is no tenderness.  Neurological: She is alert and oriented to person, place, and time.  Skin: Skin is warm and dry. She is not diaphoretic.  Nursing note and vitals reviewed.   ED Course  Procedures (including critical care time) Labs Review Labs Reviewed - No data to display  Imaging Review Dg Chest 2 View  10/10/2015  CLINICAL DATA:  Worsening shortness of breath, wheezing, history of asthma  EXAM: CHEST  2 VIEW COMPARISON:  09/10/2015 FINDINGS: Cardiomediastinal silhouette is stable. No acute infiltrate or pleural effusion. No pulmonary edema. Bony thorax is unremarkable. IMPRESSION: No active cardiopulmonary disease. Electronically Signed   By: Lahoma Crocker M.D.   On: 10/10/2015 09:29   I have personally reviewed and evaluated these images and lab results as part of my medical decision-making.   EKG Interpretation None      MDM   Final diagnoses:  Asthma exacerbation    Patient feels much better after hour-long albuterol treatment. Continues to appear well about one hour after treatment ended. She has mild expiratory wheezes but O2 sat is over 93% and she has no increased work of breathing. She feels well enough to go home. She has mild tachycardia but this is likely albuterol induced. X-ray is unremarkable. Will send home with refill of albuterol nebulizer, steroid burst, and discussed strict return precautions.    Sherwood Gambler, MD 10/10/15 1009

## 2015-10-10 NOTE — ED Notes (Signed)
RT called

## 2015-10-16 ENCOUNTER — Emergency Department (HOSPITAL_COMMUNITY)
Admission: EM | Admit: 2015-10-16 | Discharge: 2015-10-16 | Disposition: A | Discharge status: Home / Self Care | Payer: No Typology Code available for payment source | Attending: Emergency Medicine | Admitting: Emergency Medicine

## 2015-10-16 ENCOUNTER — Encounter (HOSPITAL_COMMUNITY): Payer: Self-pay | Admitting: Emergency Medicine

## 2015-10-16 DIAGNOSIS — Z862 Personal history of diseases of the blood and blood-forming organs and certain disorders involving the immune mechanism: Secondary | ICD-10-CM | POA: Insufficient documentation

## 2015-10-16 DIAGNOSIS — J45901 Unspecified asthma with (acute) exacerbation: Secondary | ICD-10-CM

## 2015-10-16 DIAGNOSIS — Z87891 Personal history of nicotine dependence: Secondary | ICD-10-CM | POA: Insufficient documentation

## 2015-10-16 DIAGNOSIS — Z79899 Other long term (current) drug therapy: Secondary | ICD-10-CM | POA: Insufficient documentation

## 2015-10-16 DIAGNOSIS — J441 Chronic obstructive pulmonary disease with (acute) exacerbation: Secondary | ICD-10-CM | POA: Insufficient documentation

## 2015-10-16 MED ORDER — ALBUTEROL (5 MG/ML) CONTINUOUS INHALATION SOLN
10.0000 mg/h | INHALATION_SOLUTION | RESPIRATORY_TRACT | Status: DC
  Administered 2015-10-16: 10 mg/h via RESPIRATORY_TRACT
  Filled 2015-10-16: qty 20

## 2015-10-16 MED ORDER — IPRATROPIUM-ALBUTEROL 0.5-2.5 (3) MG/3ML IN SOLN
3.0000 mL | Freq: Once | RESPIRATORY_TRACT | Status: AC
  Administered 2015-10-16: 3 mL via RESPIRATORY_TRACT
  Filled 2015-10-16: qty 3

## 2015-10-16 MED ORDER — ALBUTEROL SULFATE HFA 108 (90 BASE) MCG/ACT IN AERS
2.0000 | INHALATION_SPRAY | Freq: Once | RESPIRATORY_TRACT | Status: AC
  Administered 2015-10-16: 2 via RESPIRATORY_TRACT
  Filled 2015-10-16: qty 6.7

## 2015-10-16 MED ORDER — PREDNISONE 20 MG PO TABS
60.0000 mg | ORAL_TABLET | Freq: Once | ORAL | Status: AC
  Administered 2015-10-16: 60 mg via ORAL
  Filled 2015-10-16: qty 3

## 2015-10-16 MED ORDER — PREDNISONE 20 MG PO TABS: 60.0000 mg | ORAL_TABLET | Freq: Every day | ORAL | Status: DC

## 2015-10-16 MED ORDER — ALBUTEROL SULFATE HFA 108 (90 BASE) MCG/ACT IN AERS: 2.0000 | INHALATION_SPRAY | RESPIRATORY_TRACT | Status: DC | PRN

## 2015-10-16 MED ORDER — IPRATROPIUM BROMIDE 0.02 % IN SOLN
1.0000 mg | Freq: Once | RESPIRATORY_TRACT | Status: AC
  Administered 2015-10-16: 1 mg via RESPIRATORY_TRACT
  Filled 2015-10-16: qty 5

## 2015-10-16 NOTE — ED Provider Notes (Signed)
CSN: [id number]     Arrival date & time 10/16/15  1644 History   First MD Initiated Contact with Patient 10/16/15 1704     Chief Complaint  Patient presents with  . Shortness of Breath  . Asthma   HPI  April Ellis is a 40 y.o. F PMH significant for asthma, obesity, COPD presenting with a 1 day history of worsening SOB. She states her SOB today is exactly like previous asthma exacerbations. She was here last week for asthma and after an hour long breathing treatment, her SOB improved. She denies fevers, chills, cough, CP, palpitations, abdominal pain, N/V, change in bowel/bladder habits, smoking, recent surgeries, long travel/sedentary episodes, exogenous estrogen, personal or family history of blood clots.   Past Medical History  Diagnosis Date  . Anemia   . Bronchitis   . Asthma   . Obesity   . COPD (chronic obstructive pulmonary disease) [facility])    Past Surgical History  Procedure Laterality Date  . No past surgeries    . Dental surgery      2 teeth pulled, 11/22/14   Family History  Problem Relation Age of Onset  . Diabetes Mother   . Diabetes Sister   . Hypertension Sister   . Hypertension Sister    Social History  Substance Use Topics  . Smoking status: Former Smoker -- 0.25 packs/day for 15 years    Types: Cigarettes    Quit date: 09/16/2014  . Smokeless tobacco: Never Used  . Alcohol Use: No   OB History    No data available     Review of Systems  Ten systems are reviewed and are negative for acute change except as noted in the HPI  Allergies  Review of patient's allergies indicates no known allergies.  Home Medications   Prior to Admission medications   Medication Sig Start Date End Date Taking? Authorizing Provider  albuterol (PROVENTIL HFA;VENTOLIN HFA) 108 (90 BASE) MCG/ACT inhaler Inhale 2 puffs into the lungs every 6 (six) hours as needed for wheezing or shortness of breath. 09/12/15   Brayton Caves, PA-C  albuterol (PROVENTIL) (2.5 MG/3ML)  0.083% nebulizer solution Take 3 mLs (2.5 mg total) by nebulization every 4 (four) hours as needed for wheezing or shortness of breath. 10/10/15   Sherwood Gambler, MD  carbamazepine (TEGRETOL-XR) 200 MG 12 hr tablet Take 1 tablet (200 mg total) by mouth 2 (two) times daily. 09/19/15 09/18/16  Harvest Dark, MD  dextromethorphan-guaiFENesin [facility] DM) 30-600 MG 12hr tablet Take 1 tablet by mouth 2 (two) times daily as needed for cough.    Historical Provider, MD  ipratropium (ATROVENT) 0.02 % nebulizer solution Take 2.5 mLs (0.5 mg total) by nebulization 4 (four) times daily. 09/12/15   Brayton Caves, PA-C  IRON PO Take 1 tablet by mouth daily.    Historical Provider, MD  montelukast (SINGULAIR) 10 MG tablet Take 1 tablet (10 mg total) by mouth at bedtime. 09/12/15   Tiffany Daneil Dan, PA-C   BP 135/71 mmHg  Pulse 113  Temp(Src) 98.7 F (37.1 C) (Oral)  Resp 24  SpO2 96%  LMP 10/04/2015 Physical Exam  Constitutional: She appears well-developed and well-nourished. No distress.  Morbidly obese  HENT:  Head: Normocephalic and atraumatic.  Mouth/Throat: Oropharynx is clear and moist. No oropharyngeal exudate.  Eyes: Conjunctivae are normal. Pupils are equal, round, and reactive to light. Right eye exhibits no discharge. Left eye exhibits no discharge. No scleral icterus.  Neck: No tracheal deviation present.  Cardiovascular: Normal rate, regular rhythm, normal heart sounds and intact distal pulses.  Exam reveals no gallop and no friction rub.   No murmur heard. Pulmonary/Chest: Effort normal. No respiratory distress. She has wheezes. She has no rales. She exhibits no tenderness.  Lung exam limited due to patient's body habitus. Minimal end expiratory wheezes BL lower lobes  Abdominal: Soft. Bowel sounds are normal. She exhibits no distension and no mass. There is no tenderness. There is no rebound and no guarding.  Musculoskeletal: She exhibits no edema.  Lymphadenopathy:    She has no  cervical adenopathy.  Neurological: She is alert. Coordination normal.  Skin: Skin is warm and dry. No rash noted. She is not diaphoretic. No erythema.  Psychiatric: She has a normal mood and affect. Her behavior is normal.  Nursing note and vitals reviewed.   ED Course  Procedures  MDM   Final diagnoses:  Asthma exacerbation   Patient non-toxic appearing. Per triage note, patient was using accessory muscles and was 88% on RA. This was not the case with my evaluation. Patient's breathing was non-labored, and O2 sats were ranging 97%-100% on RA. Patient tachycardic, but she admits to giving herself two breathing treatments prior to presentation. Patient is low risk for PE.  Upon reassessment, patient feeling better after breathing treatment and prednisone.   [place], PA-C 10/18/15 [place], MD 10/18/15 [phone]

## 2015-10-16 NOTE — ED Notes (Signed)
Here last week for asthma, felt better when leaving. States it's been intermittent over the last week, today is much worse. Pt using accessory muscles, 88% on RA with exertion, audible wheezing across room. Has had 3 breathing treatments today, and does not have a rescue inhaler.

## 2015-10-16 NOTE — Discharge Instructions (Signed)
[name],  Nice meeting you! Please follow-up with your primary care provider. Return to the emergency department if you develop increased shortness of breath, chest pain. Feel better soon!  S. Wendie Simmer, PA-C  Asthma, Adult Asthma is a condition of the lungs in which the airways tighten and narrow. Asthma can make it hard to breathe. Asthma cannot be cured, but medicine and lifestyle changes can help control it. Asthma may be started (triggered) by:  Animal skin flakes (dander).  Dust.  Cockroaches.  Pollen.  Mold.  Smoke.  Cleaning products.  Hair sprays or aerosol sprays.  Paint fumes or strong smells.  Cold air, weather changes, and winds.  Crying or laughing hard.  Stress.  Certain medicines or drugs.  Foods, such as dried fruit, potato chips, and sparkling grape juice.  Infections or conditions (colds, flu).  Exercise.  Certain medical conditions or diseases.  Exercise or tiring activities. HOME CARE   Take medicine as told by your doctor.  Use a peak flow meter as told by your doctor. A peak flow meter is a tool that measures how well the lungs are working.  Record and keep track of the peak flow meter's readings.  Understand and use the asthma action plan. An asthma action plan is a written plan for taking care of your asthma and treating your attacks.  To help prevent asthma attacks:  Do not smoke. Stay away from secondhand smoke.  Change your heating and air conditioning filter often.  Limit your use of fireplaces and wood stoves.  Get rid of pests (such as roaches and mice) and their droppings.  Throw away plants if you see mold on them.  Clean your floors. Dust regularly. Use cleaning products that do not smell.  Have someone vacuum when you are not home. Use a vacuum cleaner with a HEPA filter if possible.  Replace carpet with wood, tile, or vinyl flooring. Carpet can trap animal skin flakes and dust.  Use  allergy-proof pillows, mattress covers, and box spring covers.  Wash bed sheets and blankets every week in hot water and dry them in a dryer.  Use blankets that are made of polyester or cotton.  Clean bathrooms and kitchens with bleach. If possible, have someone repaint the walls in these rooms with mold-resistant paint. Keep out of the rooms that are being cleaned and painted.  Wash hands often. GET HELP IF:  You have make a whistling sound when breaking (wheeze), have shortness of breath, or have a cough even if taking medicine to prevent attacks.  The colored mucus you cough up (sputum) is thicker than usual.  The colored mucus you cough up changes from clear or white to yellow, green, gray, or bloody.  You have problems from the medicine you are taking such as:  A rash.  Itching.  Swelling.  Trouble breathing.  You need reliever medicines more than 2-3 times a week.  Your peak flow measurement is still at 50-79% of your personal best after following the action plan for 1 hour.  You have a fever. GET HELP RIGHT AWAY IF:   You seem to be worse and are not responding to medicine during an asthma attack.  You are short of breath even at rest.  You get short of breath when doing very little activity.  You have trouble eating, drinking, or talking.  You have chest pain.  You have a fast heartbeat.  Your lips or fingernails start to turn blue.  You are light-headed, dizzy, or faint.  Your peak flow is less than 50% of your personal best.   This information is not intended to replace advice given to you by your health care provider. Make sure you discuss any questions you have with your health care provider.   Document Released: 03/01/2008 Document Revised: 06/04/2015 Document Reviewed: 04/12/2013 Elsevier Interactive Patient Education [employer].

## 2015-10-21 MED FILL — VENTOLIN HFA 90 MCG INHALER: 108 (90 BAS | 25 days supply | Qty: 18 | Fill #2

## 2015-10-21 MED FILL — ALBUTEROL 0.083% INHAL SOLN: (2.5 MG/3ML | 30 days supply | Qty: 90 | Fill #2

## 2015-10-23 MED FILL — ?MONTELUKAST SOD 10 MG TAB: 10 | 30 days supply | Qty: 30 | Fill #1

## 2015-10-29 ENCOUNTER — Encounter (HOSPITAL_COMMUNITY): Payer: Self-pay | Admitting: Emergency Medicine

## 2015-10-29 ENCOUNTER — Emergency Department (HOSPITAL_COMMUNITY)
Admission: EM | Admit: 2015-10-29 | Discharge: 2015-10-29 | Disposition: A | Discharge status: Home / Self Care | Payer: Self-pay | Attending: Emergency Medicine | Admitting: Emergency Medicine

## 2015-10-29 DIAGNOSIS — D649 Anemia, unspecified: Secondary | ICD-10-CM | POA: Insufficient documentation

## 2015-10-29 DIAGNOSIS — E669 Obesity, unspecified: Secondary | ICD-10-CM | POA: Insufficient documentation

## 2015-10-29 DIAGNOSIS — J441 Chronic obstructive pulmonary disease with (acute) exacerbation: Secondary | ICD-10-CM | POA: Insufficient documentation

## 2015-10-29 DIAGNOSIS — Z87891 Personal history of nicotine dependence: Secondary | ICD-10-CM | POA: Insufficient documentation

## 2015-10-29 DIAGNOSIS — J45901 Unspecified asthma with (acute) exacerbation: Secondary | ICD-10-CM

## 2015-10-29 DIAGNOSIS — Z79899 Other long term (current) drug therapy: Secondary | ICD-10-CM | POA: Insufficient documentation

## 2015-10-29 MED ORDER — DEXAMETHASONE SODIUM PHOSPHATE 10 MG/ML IJ SOLN
10.0000 mg | Freq: Once | INTRAMUSCULAR | Status: AC
  Administered 2015-10-29: 10 mg via INTRAMUSCULAR
  Filled 2015-10-29: qty 1

## 2015-10-29 MED ORDER — ALBUTEROL SULFATE HFA 108 (90 BASE) MCG/ACT IN AERS
2.0000 | INHALATION_SPRAY | Freq: Once | RESPIRATORY_TRACT | Status: AC
  Administered 2015-10-29: 2 via RESPIRATORY_TRACT
  Filled 2015-10-29: qty 6.7

## 2015-10-29 MED ORDER — IPRATROPIUM-ALBUTEROL 0.5-2.5 (3) MG/3ML IN SOLN
3.0000 mL | RESPIRATORY_TRACT | Status: DC
  Administered 2015-10-29 (×2): 3 mL via RESPIRATORY_TRACT
  Filled 2015-10-29 (×2): qty 3

## 2015-10-29 MED ORDER — PREDNISONE 20 MG PO TABS: ORAL_TABLET | ORAL | Status: DC

## 2015-10-29 MED ORDER — IPRATROPIUM-ALBUTEROL 0.5-2.5 (3) MG/3ML IN SOLN: 3.0000 mL | RESPIRATORY_TRACT | Status: DC

## 2015-10-29 NOTE — ED Provider Notes (Signed)
CSN: [id number]     Arrival date & time 10/29/15  1753 History   By signing my name below, I, April Ellis, attest that this documentation has been prepared under the direction and in the presence of [employer], PA-C. Electronically Signed: [name], ED Scribe. 10/29/2015. 6:08 PM.    Chief Complaint  Patient presents with  . Asthma    Patient is a 40 y.o. female presenting with asthma. The history is provided by the patient. No language interpreter was used.  Asthma   HPI Comments: April Ellis is a 40 y.o. female who presents to the Emergency Department complaining of asthma exacerbation onset 2 days prior. Pt states she has associated wheezing. Pt states that she has tried her at home nebulizer and inhaler with no relief. Pt states that she has a Hx of asthma and this feels like previous asthma exacerbations. Pt states that she has not been complaint with her daily preventative medications due to her finances and insurance. Denies unilateraly leg swelling.  Denies Hx of PE"s or DVT's  Past Medical History  Diagnosis Date  . Anemia   . Bronchitis   . Asthma   . Obesity   . COPD (chronic obstructive pulmonary disease) [facility])    Past Surgical History  Procedure Laterality Date  . No past surgeries    . Dental surgery      2 teeth pulled, 11/22/14   Family History  Problem Relation Age of Onset  . Diabetes Mother   . Diabetes Sister   . Hypertension Sister   . Hypertension Sister    Social History  Substance Use Topics  . Smoking status: Former Smoker -- 0.25 packs/day for 15 years    Types: Cigarettes    Quit date: 09/16/2014  . Smokeless tobacco: Never Used  . Alcohol Use: No   OB History    No data available     Review of Systems A complete 10 system review of systems was obtained and all systems are negative except as noted in the HPI and PMH.     Allergies  Review of patient's allergies indicates no known allergies.  Home Medications    Prior to Admission medications   Medication Sig Start Date End Date Taking? Authorizing Provider  albuterol (PROVENTIL HFA;VENTOLIN HFA) 108 (90 Base) MCG/ACT inhaler Inhale 2 puffs into the lungs every 4 (four) hours as needed for wheezing or shortness of breath. 10/16/15   Rangely Lions, PA-C  albuterol (PROVENTIL) (2.5 MG/3ML) 0.083% nebulizer solution Take 3 mLs (2.5 mg total) by nebulization every 4 (four) hours as needed for wheezing or shortness of breath. 10/10/15   Sherwood Gambler, MD  carbamazepine (TEGRETOL-XR) 200 MG 12 hr tablet Take 1 tablet (200 mg total) by mouth 2 (two) times daily. 09/19/15 09/18/16  Harvest Dark, MD  dextromethorphan-guaiFENesin [facility] DM) 30-600 MG 12hr tablet Take 1 tablet by mouth 2 (two) times daily as needed for cough.    Historical Provider, MD  ferrous sulfate 325 (65 FE) MG tablet Take 325 mg by mouth daily with breakfast.    Historical Provider, MD  ipratropium (ATROVENT) 0.02 % nebulizer solution Take 2.5 mLs (0.5 mg total) by nebulization 4 (four) times daily. 09/12/15   Tiffany Daneil Dan, PA-C  montelukast (SINGULAIR) 10 MG tablet Take 1 tablet (10 mg total) by mouth at bedtime. 09/12/15   Tiffany Daneil Dan, PA-C  predniSONE (DELTASONE) 20 MG tablet 3 tabs po daily x 3 days, then 2 tabs x 3  days, then 1.5 tabs x 3 days, then 1 tab x 3 days, then 0.5 tabs x 3 days 10/29/15   Comer Locket, PA-C   BP 152/65 mmHg  Pulse 109  Temp(Src) 97.5 F (36.4 C) (Oral)  Resp 16  SpO2 100%  LMP 10/04/2015   Physical Exam  Constitutional: She is oriented to person, place, and time. She appears well-developed and well-nourished. No distress.  Morbidly obese african [place] female  HENT:  Head: Normocephalic and atraumatic.  Mouth/Throat: Oropharynx is clear and moist.  Patent airway.   Eyes: Conjunctivae and EOM are normal.  Neck: Neck supple. No tracheal deviation present.  Cardiovascular: Normal rate and regular rhythm.   Pulmonary/Chest:  Effort normal. No respiratory distress. She has wheezes.  inspiratory and expiratory wheezing diffusely worse on the left side.   Musculoskeletal: Normal range of motion. She exhibits no edema or tenderness.   No unilateral leg swelling.    Neurological: She is alert and oriented to person, place, and time.  Skin: Skin is warm and dry.  Psychiatric: She has a normal mood and affect. Her behavior is normal.  Nursing note and vitals reviewed.   ED Course  Procedures (including critical care time) DIAGNOSTIC STUDIES: Oxygen Saturation is 100% on RA, normal by my interpretation.    COORDINATION OF CARE: 6:11 PM-Discussed treatment plan with pt at bedside and pt agreed to plan.     Labs Review Labs Reviewed - No data to display  Imaging Review No results found.    EKG Interpretation None     Meds given in ED:  Medications  ipratropium-albuterol (DUONEB) 0.5-2.5 (3) MG/3ML nebulizer solution 3 mL (3 mLs Nebulization Given 10/29/15 1845)  dexamethasone (DECADRON) injection 10 mg (10 mg Intramuscular Given 10/29/15 1825)  albuterol (PROVENTIL HFA;VENTOLIN HFA) 108 (90 Base) MCG/ACT inhaler 2 puff (2 puffs Inhalation Given 10/29/15 1819)    New Prescriptions   PREDNISONE (DELTASONE) 20 MG TABLET    3 tabs po daily x 3 days, then 2 tabs x 3 days, then 1.5 tabs x 3 days, then 1 tab x 3 days, then 0.5 tabs x 3 days   Filed Vitals:   10/29/15 1759  BP: 152/65  Pulse: 109  Temp: 97.5 F (36.4 C)  TempSrc: Oral  Resp: 16  SpO2: 100%    MDM  Patient with signs and symptoms consistent of her previous asthma exacerbations. States this is the same. Oxygen saturation is 100% on room air. No accessory muscle use, no cyanosis. No cough. Patient does have inspiratory and expiratory wheezing but does not have any labored breathing or respiratory distress. Treated in the ED with breathing treatment and 10 mg of IM Decadron.  Patient feels improved after treatment. Still has mild wheezing on  exam, will do one more breathing treatment. Will discharge with albuterol inhaler and steroid taper. Low suspicion for PE. Low wells score. Doubt pneumonia. Pt instructed to follow up with PCP. Patient HDS. Mild tachycardia on arrival likely due to previously administered albuterol treatment. Discussed return precautions. Appears safe for discharge.    Final diagnoses:  Asthma exacerbation     I personally performed the services described in this documentation, which was scribed in my presence. The recorded information has been reviewed and is accurate.      Comer Locket, PA-C 10/29/15 [place], PA-C 10/29/15 [place], DO 10/29/15 2052

## 2015-10-29 NOTE — Progress Notes (Signed)
[facility] consulted for medication assistance.  Patient to be discharged with ventolin inhaler and RX for prednisone.  EDCM provided patient with coupon for prednisone dose pack from [employer] to [place].  Patient agreeable to cost.  Patient thankful for services.  No further EDCM needs at this time.

## 2015-10-29 NOTE — Discharge Instructions (Signed)
Follow-up with your doctor for reevaluation. Return to ED for any new or worsening symptoms. Take medications as prescribed.  Asthma, Acute Bronchospasm Acute bronchospasm caused by asthma is also referred to as an asthma attack. Bronchospasm means your air passages become narrowed. The narrowing is caused by inflammation and tightening of the muscles in the air tubes (bronchi) in your lungs. This can make it hard to breathe or cause you to wheeze and cough. CAUSES Possible triggers are:  Animal dander from the skin, hair, or feathers of animals.  Dust mites contained in house dust.  Cockroaches.  Pollen from trees or grass.  Mold.  Cigarette or tobacco smoke.  Air pollutants such as dust, household cleaners, hair sprays, aerosol sprays, paint fumes, strong chemicals, or strong odors.  Cold air or weather changes. Cold air may trigger inflammation. Winds increase molds and pollens in the air.  Strong emotions such as crying or laughing hard.  Stress.  Certain medicines such as aspirin or beta-blockers.  Sulfites in foods and drinks, such as dried fruits and wine.  Infections or inflammatory conditions, such as a flu, cold, or inflammation of the nasal membranes (rhinitis).  Gastroesophageal reflux disease (GERD). GERD is a condition where stomach acid backs up into your esophagus.  Exercise or strenuous activity. SIGNS AND SYMPTOMS   Wheezing.  Excessive coughing, particularly at night.  Chest tightness.  Shortness of breath. DIAGNOSIS  Your health care provider will ask you about your medical history and perform a physical exam. A chest X-ray or blood testing may be performed to look for other causes of your symptoms or other conditions that may have triggered your asthma attack. TREATMENT  Treatment is aimed at reducing inflammation and opening up the airways in your lungs. Most asthma attacks are treated with inhaled medicines. These include quick relief or rescue  medicines (such as bronchodilators) and controller medicines (such as inhaled corticosteroids). These medicines are sometimes given through an inhaler or a nebulizer. Systemic steroid medicine taken by mouth or given through an IV tube also can be used to reduce the inflammation when an attack is moderate or severe. Antibiotic medicines are only used if a bacterial infection is present.  HOME CARE INSTRUCTIONS   Rest.  Drink plenty of liquids. This helps the mucus to remain thin and be easily coughed up. Only use caffeine in moderation and do not use alcohol until you have recovered from your illness.  Do not smoke. Avoid being exposed to secondhand smoke.  You play a critical role in keeping yourself in good health. Avoid exposure to things that cause you to wheeze or to have breathing problems.  Keep your medicines up-to-date and available. Carefully follow your health care provider's treatment plan.  Take your medicine exactly as prescribed.  When pollen or pollution is bad, keep windows closed and use an air conditioner or go to places with air conditioning.  Asthma requires careful medical care. See your health care provider for a follow-up as advised. If you are more than [redacted] weeks pregnant and you were prescribed any new medicines, let your obstetrician know about the visit and how you are doing. Follow up with your health care provider as directed.  After you have recovered from your asthma attack, make an appointment with your outpatient doctor to talk about ways to reduce the likelihood of future attacks. If you do not have a doctor who manages your asthma, make an appointment with a primary care doctor to discuss your asthma. SEEK  IMMEDIATE MEDICAL CARE IF:   You are getting worse.  You have trouble breathing. If severe, call your local emergency services (911 in the [place]  You develop chest pain or discomfort.  You are vomiting.  You are not able to keep fluids down.  You  are coughing up yellow, green, brown, or bloody sputum.  You have a fever and your symptoms suddenly get worse.  You have trouble swallowing. MAKE SURE YOU:   Understand these instructions.  Will watch your condition.  Will get help right away if you are not doing well or get worse.   This information is not intended to replace advice given to you by your health care provider. Make sure you discuss any questions you have with your health care provider.   Document Released: 12/29/2006 Document Revised: 09/18/2013 Document Reviewed: 03/21/2013 Elsevier Interactive Patient Education [employer].

## 2015-10-29 NOTE — ED Notes (Signed)
Pt reports asthma attack with consistent expiratory wheezing despite use of inhaler. Reports shortness of breath , denies chest pain . Pt also reports coughing. Pt has auditory wheezing yet able to speak full sentences without difficulty. Alert and oriented x 4.

## 2015-12-17 MED FILL — !PROAIR HFA 90 MCG INHALER: 108 (90 BAS | 1 days supply | Qty: 1 | Fill #3

## 2015-12-31 ENCOUNTER — Emergency Department (HOSPITAL_COMMUNITY)
Admission: EM | Admit: 2015-12-31 | Discharge: 2015-12-31 | Disposition: A | Discharge status: Home / Self Care | Payer: Self-pay | Attending: Emergency Medicine | Admitting: Emergency Medicine

## 2015-12-31 ENCOUNTER — Encounter (HOSPITAL_COMMUNITY): Payer: Self-pay

## 2015-12-31 ENCOUNTER — Emergency Department (HOSPITAL_COMMUNITY): Payer: Self-pay

## 2015-12-31 DIAGNOSIS — J45901 Unspecified asthma with (acute) exacerbation: Secondary | ICD-10-CM

## 2015-12-31 DIAGNOSIS — J441 Chronic obstructive pulmonary disease with (acute) exacerbation: Secondary | ICD-10-CM | POA: Insufficient documentation

## 2015-12-31 DIAGNOSIS — D649 Anemia, unspecified: Secondary | ICD-10-CM | POA: Insufficient documentation

## 2015-12-31 DIAGNOSIS — E669 Obesity, unspecified: Secondary | ICD-10-CM | POA: Insufficient documentation

## 2015-12-31 DIAGNOSIS — Z7951 Long term (current) use of inhaled steroids: Secondary | ICD-10-CM | POA: Insufficient documentation

## 2015-12-31 DIAGNOSIS — Z79899 Other long term (current) drug therapy: Secondary | ICD-10-CM | POA: Insufficient documentation

## 2015-12-31 DIAGNOSIS — Z87891 Personal history of nicotine dependence: Secondary | ICD-10-CM | POA: Insufficient documentation

## 2015-12-31 MED ORDER — ALBUTEROL SULFATE (2.5 MG/3ML) 0.083% IN NEBU: 2.5000 mg | INHALATION_SOLUTION | Freq: Four times a day (QID) | RESPIRATORY_TRACT | Status: DC | PRN

## 2015-12-31 MED ORDER — PREDNISONE 20 MG PO TABS
60.0000 mg | ORAL_TABLET | Freq: Once | ORAL | Status: AC
  Administered 2015-12-31: 60 mg via ORAL
  Filled 2015-12-31: qty 3

## 2015-12-31 MED ORDER — BUDESONIDE-FORMOTEROL FUMARATE 160-4.5 MCG/ACT IN AERO: 2.0000 | INHALATION_SPRAY | Freq: Two times a day (BID) | RESPIRATORY_TRACT | Status: DC

## 2015-12-31 MED ORDER — ALBUTEROL (5 MG/ML) CONTINUOUS INHALATION SOLN
10.0000 mg/h | INHALATION_SOLUTION | RESPIRATORY_TRACT | Status: AC
  Administered 2015-12-31: 10 mg/h via RESPIRATORY_TRACT
  Filled 2015-12-31: qty 20

## 2015-12-31 MED ORDER — ALBUTEROL SULFATE (2.5 MG/3ML) 0.083% IN NEBU
5.0000 mg | INHALATION_SOLUTION | Freq: Once | RESPIRATORY_TRACT | Status: AC
  Administered 2015-12-31: 5 mg via RESPIRATORY_TRACT
  Filled 2015-12-31: qty 6

## 2015-12-31 MED ORDER — PREDNISONE 20 MG PO TABS: 40.0000 mg | ORAL_TABLET | Freq: Every day | ORAL | Status: DC

## 2015-12-31 NOTE — ED Notes (Signed)
Resp 22. O2 sat 97 while ambulating. HR 112

## 2015-12-31 NOTE — ED Notes (Signed)
Pt c/o SOB x "a couple days."  Denies pain.  Pt has been using neb machine and inhaler w/o relief.  Hx COPD, asthma, and bronchitis.  NAD noted.  Pt easily speaking full sentences.

## 2015-12-31 NOTE — Progress Notes (Signed)
[facility] consulted for medication assistance.  Patient listed as not having a pcp or insurance living in [facility].  EDCM spoke to patient at bedside.  Patient reports she has lost her job and insurance.  She reports she has a ventolin inhaler at home, also takes singulair for her asthma.  She reports she has used other inhalers in the past but Symbicort works the best for her.  EDCM received patient's permission to contact [facility] in attempts to make an appointment for follow up.  Patient has been seen 9 times in the ED for similar symptoms.  Patient is also interested in the orange card.  Patient reports she has call the patient assistance program for symbicort which will be one hundred dollars off per patient which will still be over two hundred dollars per patient.  EDCM placed patient in [facility] program.  Explained to patient that this is a one time a year assist and she will have to get her prescription filled within seven days.  Patient thankful for services.  EDCM discussed with EDPA.  Patient also to be sent home with prescription for prednisone which is four dollars at [employer].  Patient is aware and comfortable with copay.  No further EDCM needs at this time.

## 2015-12-31 NOTE — Discharge Instructions (Signed)
Asthma Attack Prevention While you may not be able to control the fact that you have asthma, you can take actions to prevent asthma attacks. The best way to prevent asthma attacks is to maintain good control of your asthma. You can achieve this by:  Taking your medicines as directed.  Avoiding things that can irritate your airways or make your asthma symptoms worse (asthma triggers).  Keeping track of how well your asthma is controlled and of any changes in your symptoms.  Responding quickly to worsening asthma symptoms (asthma attack).  Seeking emergency care when it is needed. WHAT ARE SOME WAYS TO PREVENT AN ASTHMA ATTACK? Have a Plan Work with your health care provider to create a written plan for managing and treating your asthma attacks (asthma action plan). This plan includes:  A list of your asthma triggers and how you can avoid them.  Information on when medicines should be taken and when their dosages should be changed.  The use of a device that measures how well your lungs are working (peak flow meter). Monitor Your Asthma Use your peak flow meter and record your results in a journal every day. A drop in your peak flow numbers on one or more days may indicate the start of an asthma attack. This can happen even before you start to feel symptoms. You can prevent an asthma attack from getting worse by following the steps in your asthma action plan. Avoid Asthma Triggers Work with your asthma health care provider to find out what your asthma triggers are. This can be done by:  Allergy testing.  Keeping a journal that notes when asthma attacks occur and the factors that may have contributed to them.  Determining if there are other medical conditions that are making your asthma worse. Once you have determined your asthma triggers, take steps to avoid them. This may include avoiding excessive or prolonged exposure to:  Dust. Have someone dust and vacuum your home for you once or  twice a week. Using a high-efficiency particulate arrestance (HEPA) vacuum is best.  Smoke. This includes campfire smoke, forest fire smoke, and secondhand smoke from tobacco products.  Pet dander. Avoid contact with animals that you know you are allergic to.  Allergens from trees, grasses or pollens. Avoid spending a lot of time outdoors when pollen counts are high, and on very windy days.  Very cold, dry, or humid air.  Mold.  Foods that contain high amounts of sulfites.  Strong odors.  Outdoor air pollutants, such as engine exhaust.  Indoor air pollutants, such as aerosol sprays and fumes from household cleaners.  Household pests, including dust mites and cockroaches, and pest droppings.  Certain medicines, including NSAIDs. Always talk to your health care provider before stopping or starting any new medicines. Medicines Take over-the-counter and prescription medicines only as told by your health care provider. Many asthma attacks can be prevented by carefully following your medicine schedule. Taking your medicines correctly is especially important when you cannot avoid certain asthma triggers. Act Quickly If an asthma attack does happen, acting quickly can decrease how severe it is and how long it lasts. Take these steps:   Pay attention to your symptoms. If you are coughing, wheezing, or having difficulty breathing, do not wait to see if your symptoms go away on their own. Follow your asthma action plan.  If you have followed your asthma action plan and your symptoms are not improving, call your health care provider or seek immediate medical care   at the nearest hospital. It is important to note how often you need to use your fast-acting rescue inhaler. If you are using your rescue inhaler more often, it may mean that your asthma is not under control. Adjusting your asthma treatment plan may help you to prevent future asthma attacks and help you to gain better control of your  condition. HOW CAN I PREVENT AN ASTHMA ATTACK WHEN I EXERCISE? Follow advice from your health care provider about whether you should use your fast-acting inhaler before exercising. Many people with asthma experience exercise-induced bronchoconstriction (EIB). This condition often worsens during vigorous exercise in cold, humid, or dry environments. Usually, people with EIB can stay very active by pre-treating with a fast-acting inhaler before exercising.   This information is not intended to replace advice given to you by your health care provider. Make sure you discuss any questions you have with your health care provider.   Document Released: 09/01/2009 Document Revised: 06/04/2015 Document Reviewed: 02/13/2015 Elsevier Interactive Patient Education 2016 Elsevier Inc.  

## 2015-12-31 NOTE — ED Notes (Signed)
Respiratory called for continuous neb 

## 2015-12-31 NOTE — ED Provider Notes (Signed)
CSN: [id number]     Arrival date & time 12/31/15  1354 History  By signing my name below, I, April Ellis, attest that this documentation has been prepared under the direction and in the presence of April Loan, PA-C. Electronically Signed: Eustaquio Ellis, ED Scribe. 12/31/2015. 5:47 PM.   Chief Complaint  Patient presents with  . Shortness of Breath   The history is provided by the patient. No language interpreter was used.     HPI Comments: April Ellis is a 40 y.o. female with PMHx Asthma and COPD who presents to the Emergency Department complaining of gradual onset, constant, shortness of breath x a couple of days. She reports her shortness of breath feels similar to previous asthma exacerbations. Pt has been using her inhaler and nebulizer machine without relief. Pt recently ran out of her Symbicort, and is unable to afford to get it filled due to lack of insurance. She received a breathing treatment in the ED with some relief. Denies fever, chills, nausea, vomiting, cough, hemoptysis, chest pain, or any other associated symptoms. No hx DVT/PE.   Past Medical History  Diagnosis Date  . Anemia   . Bronchitis   . Asthma   . Obesity   . COPD (chronic obstructive pulmonary disease) [facility])    Past Surgical History  Procedure Laterality Date  . No past surgeries    . Dental surgery      2 teeth pulled, 11/22/14   Family History  Problem Relation Age of Onset  . Diabetes Mother   . Diabetes Sister   . Hypertension Sister   . Hypertension Sister    Social History  Substance Use Topics  . Smoking status: Former Smoker -- 0.25 packs/day for 15 years    Types: Cigarettes    Quit date: 09/16/2014  . Smokeless tobacco: Never Used  . Alcohol Use: No   OB History    No data available     Review of Systems  Constitutional: Negative for fever and chills.  Respiratory: Positive for shortness of breath. Negative for cough.   Cardiovascular: Negative for chest pain.   Gastrointestinal: Negative for nausea and vomiting.  All other systems reviewed and are negative.  Allergies  Review of patient's allergies indicates no known allergies.  Home Medications   Prior to Admission medications   Medication Sig Start Date End Date Taking? Authorizing Provider  albuterol (PROVENTIL HFA;VENTOLIN HFA) 108 (90 Base) MCG/ACT inhaler Inhale 2 puffs into the lungs every 4 (four) hours as needed for wheezing or shortness of breath. 10/16/15  Yes [place] Lions, PA-C  albuterol (PROVENTIL) (2.5 MG/3ML) 0.083% nebulizer solution Take 3 mLs (2.5 mg total) by nebulization every 4 (four) hours as needed for wheezing or shortness of breath. 10/10/15  Yes Sherwood Gambler, MD  budesonide-formoterol [facility]) 160-4.5 MCG/ACT inhaler Inhale 2 puffs into the lungs 2 (two) times daily.   Yes Historical Provider, MD  ferrous sulfate 325 (65 FE) MG tablet Take 325 mg by mouth daily with breakfast. Reported on 12/31/2015   Yes Historical Provider, MD  ipratropium (ATROVENT) 0.02 % nebulizer solution Take 2.5 mLs (0.5 mg total) by nebulization 4 (four) times daily. Patient taking differently: Take 0.5 mg by nebulization every 4 (four) hours as needed for wheezing or shortness of breath.  09/12/15  Yes Tiffany Daneil Dan, PA-C  montelukast (SINGULAIR) 10 MG tablet Take 1 tablet (10 mg total) by mouth at bedtime. Patient taking differently: Take 10 mg by mouth daily.  09/12/15  Yes Brayton Caves, PA-C  carbamazepine (TEGRETOL-XR) 200 MG 12 hr tablet Take 1 tablet (200 mg total) by mouth 2 (two) times daily. Patient not taking: Reported on 12/31/2015 09/19/15 09/18/16  Harvest Dark, MD  predniSONE (DELTASONE) 20 MG tablet 3 tabs po daily x 3 days, then 2 tabs x 3 days, then 1.5 tabs x 3 days, then 1 tab x 3 days, then 0.5 tabs x 3 days Patient not taking: Reported on 12/31/2015 10/29/15   Comer Locket, PA-C   BP 143/49 mmHg  Pulse 103  Temp(Src) 98.2 F (36.8 C) (Oral)  Resp 16   SpO2 95%  LMP 12/02/2015 (Approximate)   Physical Exam  Constitutional: She is oriented to person, place, and time. She appears well-developed and well-nourished.  Non-toxic appearance. She does not have a sickly appearance. She does not appear ill.  HENT:  Head: Normocephalic and atraumatic.  Mouth/Throat: Oropharynx is clear and moist.  Eyes: Conjunctivae are normal. Pupils are equal, round, and reactive to light.  Neck: Normal range of motion. Neck supple.  Cardiovascular: Normal rate, regular rhythm and normal heart sounds.   No murmur heard. No unilateral leg swelling.   Pulmonary/Chest: Effort normal. No accessory muscle usage or stridor. No respiratory distress. She has wheezes (diffuse inspiratory and expiratory bilaterally). She has no rhonchi. She has no rales.  95% on RA. Pt able to speak in full sentences without difficulty. No labored breathing.   Abdominal: Soft. Bowel sounds are normal. She exhibits no distension. There is no tenderness.  Musculoskeletal: Normal range of motion.  Lymphadenopathy:    She has no cervical adenopathy.  Neurological: She is alert and oriented to person, place, and time.  Speech clear without dysarthria.  Skin: Skin is warm and dry.  Psychiatric: She has a normal mood and affect. Her behavior is normal.    ED Course  Procedures (including critical care time)  DIAGNOSTIC STUDIES: Oxygen Saturation is 95% on RA, adequate by my interpretation.    COORDINATION OF CARE: 5:42 PM-Discussed treatment plan which includes breathing treatment and Rx Prednisone with pt at bedside and pt agreed to plan.   Labs Review Labs Reviewed - No data to display  Imaging Review Dg Chest 2 View  12/31/2015  CLINICAL DATA:  Shortness of breath. EXAM: CHEST  2 VIEW COMPARISON:  10/10/2015. FINDINGS: Mediastinum and hilar structures are normal. Mild cardiomegaly. No focal infiltrate. No pleural effusion or pneumothorax . No acute bony abnormality. IMPRESSION: 1.  Mild cardiomegaly. 2. No focal pulmonary infiltrate. Electronically Signed   By: April Ellis  Register   On: 12/31/2015 14:45   I have personally reviewed and evaluated these images as part of my medical decision-making.   EKG Interpretation None      MDM  Patient presents with her typical asthma exacerbation.  Mild tachycardia on arrival.  No evidence of hypoxia.  Patient ambulatory without difficulty.  Afebrile, well-appearing.  On exam, diffuse inspiratory and expiratory wheezing bilaterally.  No unilateral leg swelling.  No hx of DVT/PE.  Low risk for PE using Well's criteria.  CXR negative, doubt PNA.  Patient received breathing treatment in triage and reports minimal improvement.  Patient given CAT and PO prednisone in ED.  Upon reassessment, decreased wheezing and patient reports improvement. Able to ambulate in the hall without hypoxia, oxygen saturation 95-97%.  Plan to discharge home with prednisone, albuterol neb refill, and symbicort.  CM consulted regarding Symbicort refill. Follow up PCP.  Discussed return precautions.  Patient agrees and acknowledge  the above plan for discharge.   Final diagnoses:  Asthma exacerbation   I personally performed the services described in this documentation, which was scribed in my presence. The recorded information has been reviewed and is accurate.      April Loan, PA-C 12/31/15 1951  Merrily Pew, MD 01/01/16 [phone]

## 2016-01-01 ENCOUNTER — Telehealth: Payer: Self-pay

## 2016-01-01 NOTE — Telephone Encounter (Signed)
This Case Manager received communication from [place] who indicated patient need follow-up appointment. Spoke with patient and appointment scheduled for 4/19 at 1200 with Dr. Janne Napoleon. Patient appreciative of appointment. Also informed patient of Orange Card process and Office manager walk-in hours. Patient appreciative of information. No additional needs identified.

## 2016-01-14 ENCOUNTER — Inpatient Hospital Stay: Payer: Self-pay | Admitting: Internal Medicine

## 2016-04-22 ENCOUNTER — Encounter (HOSPITAL_COMMUNITY): Payer: Self-pay | Admitting: Emergency Medicine

## 2016-04-22 ENCOUNTER — Emergency Department (HOSPITAL_COMMUNITY)
Admission: EM | Admit: 2016-04-22 | Discharge: 2016-04-22 | Disposition: A | Discharge status: Home / Self Care | Payer: Self-pay | Attending: Emergency Medicine | Admitting: Emergency Medicine

## 2016-04-22 DIAGNOSIS — Z87891 Personal history of nicotine dependence: Secondary | ICD-10-CM | POA: Insufficient documentation

## 2016-04-22 DIAGNOSIS — J449 Chronic obstructive pulmonary disease, unspecified: Secondary | ICD-10-CM | POA: Insufficient documentation

## 2016-04-22 DIAGNOSIS — Z79899 Other long term (current) drug therapy: Secondary | ICD-10-CM | POA: Insufficient documentation

## 2016-04-22 DIAGNOSIS — J45909 Unspecified asthma, uncomplicated: Secondary | ICD-10-CM | POA: Insufficient documentation

## 2016-04-22 DIAGNOSIS — Z760 Encounter for issue of repeat prescription: Secondary | ICD-10-CM | POA: Insufficient documentation

## 2016-04-22 MED ORDER — BUDESONIDE-FORMOTEROL FUMARATE 160-4.5 MCG/ACT IN AERO
2.0000 | INHALATION_SPRAY | Freq: Two times a day (BID) | RESPIRATORY_TRACT | 1 refills | Status: DC
Start: 2016-04-22 — End: 2016-07-14

## 2016-04-22 MED ORDER — ALBUTEROL SULFATE (2.5 MG/3ML) 0.083% IN NEBU: 2.5000 mg | INHALATION_SOLUTION | Freq: Four times a day (QID) | RESPIRATORY_TRACT | 1 refills | Status: DC | PRN

## 2016-04-22 MED ORDER — ALBUTEROL SULFATE HFA 108 (90 BASE) MCG/ACT IN AERS
2.0000 | INHALATION_SPRAY | Freq: Once | RESPIRATORY_TRACT | Status: AC
  Administered 2016-04-22: 2 via RESPIRATORY_TRACT
  Filled 2016-04-22: qty 6.7

## 2016-04-22 MED ORDER — ALBUTEROL SULFATE HFA 108 (90 BASE) MCG/ACT IN AERS: 1.0000 | INHALATION_SPRAY | Freq: Four times a day (QID) | RESPIRATORY_TRACT | 0 refills | Status: DC | PRN

## 2016-04-22 NOTE — ED Triage Notes (Addendum)
Pt ran out of home symbicort and needs a refill. Has been using her home albuterol nebulizer and inhaler more often to compensate.

## 2016-04-22 NOTE — ED Provider Notes (Signed)
[facility] DEPT Provider Note   CSN: [id number] Arrival date & time: 04/22/16  1213  First Provider Contact:  2:18 PM    By signing my name below, I, Rayna Sexton, attest that this documentation has been prepared under the direction and in the presence of Waynetta Pean, PA-C. Electronically Signed: Rayna Sexton, ED Scribe. 04/22/16. 2:23 PM.   History   Chief Complaint Chief Complaint  Patient presents with  . Medication Refill    HPI HPI Comments: April Ellis is a 40 y.o. female with a PMHx of Asthma, COPD and morbid obesity who presents to the Emergency Department requesting a refill of her rx Symbicort. She states she ran out of her Symbicort and beginning yesterday began experiencing mild, intermittent, SOB but denies any current SOB. She reports using her Albuterol inhaler an increasing amount with significant relief. Pt states she began a new job and her healthcare coverage will not begin for another few days. She is working on getting follow up with PCP for when her insurance starts. She denies cough, hemoptysis, sore throat, difficulty swallowing, fevers and chills.   The history is provided by the patient. No language interpreter was used.    Past Medical History:  Diagnosis Date  . Anemia   . Asthma   . Bronchitis   . COPD (chronic obstructive pulmonary disease) ([place])   . Obesity     Patient Active Problem List   Diagnosis Date Noted  . Status asthmaticus 12/01/2014  . Respiratory distress   . Asthma exacerbation 09/30/2014  . Viral URI 09/30/2014  . Sinus tachycardia ([place]) 09/30/2014  . Leukocytosis 09/30/2014  . Allergic reaction   . Iron deficiency anemia 08/14/2014  . Episodic tobacco abuse 08/14/2014  . Acute bronchitis 08/14/2014  . Moderate persistent asthma with acute exacerbation in adult 08/13/2014  . SOB (shortness of breath) 07/25/2014  . Atypical chest pain 06/27/2014  . COPD exacerbation ([place]) 05/12/2014  . Acute respiratory  failure with hypoxia ([place]) 05/12/2014  . Tobacco abuse 05/12/2014  . OSA (obstructive sleep apnea) 05/03/2014  . COPD (chronic obstructive pulmonary disease) ([place]) 05/01/2014  . Shortness of breath 04/01/2014  . Morbid obesity ([place]) 12/19/2008  . ELEVATED BLOOD PRESSURE WITHOUT DIAGNOSIS OF HYPERTENSION 12/19/2008    Past Surgical History:  Procedure Laterality Date  . DENTAL SURGERY     2 teeth pulled, 11/22/14  . NO PAST SURGERIES      OB History    No data available     Home Medications    Prior to Admission medications   Medication Sig Start Date End Date Taking? Authorizing Provider  dextromethorphan-guaiFENesin (MUCINEX DM) 30-600 MG 12hr tablet Take 1 tablet by mouth 2 (two) times daily as needed for cough.   Yes Historical Provider, MD  ferrous sulfate 325 (65 FE) MG tablet Take 325 mg by mouth daily with breakfast. Reported on 12/31/2015   Yes Historical Provider, MD  ipratropium (ATROVENT) 0.02 % nebulizer solution Take 2.5 mLs (0.5 mg total) by nebulization 4 (four) times daily. Patient taking differently: Take 0.5 mg by nebulization every 4 (four) hours as needed for wheezing or shortness of breath.  09/12/15  Yes Tiffany Daneil Dan, PA-C  montelukast (SINGULAIR) 10 MG tablet Take 1 tablet (10 mg total) by mouth at bedtime. Patient taking differently: Take 10 mg by mouth daily as needed (allergies).  09/12/15  Yes Tiffany Daneil Dan, PA-C  albuterol (PROVENTIL HFA;VENTOLIN HFA) 108 (90 Base) MCG/ACT inhaler Inhale 1-2 puffs into the lungs every  6 (six) hours as needed for wheezing or shortness of breath. 04/22/16   Waynetta Pean, PA-C  albuterol (PROVENTIL) (2.5 MG/3ML) 0.083% nebulizer solution Take 3 mLs (2.5 mg total) by nebulization every 6 (six) hours as needed for wheezing or shortness of breath. 04/22/16   Waynetta Pean, PA-C  budesonide-formoterol (SYMBICORT) 160-4.5 MCG/ACT inhaler Inhale 2 puffs into the lungs 2 (two) times daily. 04/22/16   Waynetta Pean, PA-C    carbamazepine (TEGRETOL-XR) 200 MG 12 hr tablet Take 1 tablet (200 mg total) by mouth 2 (two) times daily. Patient not taking: Reported on 12/31/2015 09/19/15 09/18/16  Harvest Dark, MD  predniSONE (DELTASONE) 20 MG tablet Take 2 tablets (40 mg total) by mouth daily. Patient not taking: Reported on 04/22/2016 12/31/15   Gloriann Loan, PA-C    Family History Family History  Problem Relation Age of Onset  . Diabetes Mother   . Diabetes Sister   . Hypertension Sister   . Hypertension Sister     Social History Social History  Substance Use Topics  . Smoking status: Former Smoker    Packs/day: 0.25    Years: 15.00    Types: Cigarettes    Quit date: 09/16/2014  . Smokeless tobacco: Never Used  . Alcohol use No     Allergies   Review of patient's allergies indicates no known allergies.   Review of Systems Review of Systems  Constitutional: Negative for chills and fever.  HENT: Negative for nosebleeds, sore throat and trouble swallowing.   Eyes: Negative for visual disturbance.  Respiratory: Positive for shortness of breath (resolved). Negative for cough and wheezing.   Cardiovascular: Negative for chest pain and palpitations.  Gastrointestinal: Negative for abdominal pain.  Skin: Negative for rash.  Neurological: Negative for syncope.    Physical Exam Updated Vital Signs BP 116/90   Pulse 96   Temp 98.7 F (37.1 C) (Oral)   Resp 16   SpO2 98%   Physical Exam  Constitutional: She appears well-developed and well-nourished. No distress.  Nontoxic appearing. Obese female.  HENT:  Head: Normocephalic and atraumatic.  Right Ear: External ear normal.  Left Ear: External ear normal.  Eyes: Pupils are equal, round, and reactive to light. Right eye exhibits no discharge. Left eye exhibits no discharge.  Neck: Neck supple. No JVD present.  Cardiovascular: Normal rate, regular rhythm, normal heart sounds and intact distal pulses.   Pulmonary/Chest: Effort normal and breath  sounds normal. No stridor. No respiratory distress. She has no wheezes. She has no rales.  Lungs clear to auscultation bilaterally. No wheezing. No increased work of breathing.  Musculoskeletal: She exhibits no edema.  Lymphadenopathy:    She has no cervical adenopathy.  Neurological: She is alert. Coordination normal.  Skin: Skin is warm and dry. Capillary refill takes less than 2 seconds. No rash noted. She is not diaphoretic. No erythema. No pallor.  Psychiatric: She has a normal mood and affect. Her behavior is normal.  Nursing note and vitals reviewed.   ED Treatments / Results  Labs (all labs ordered are listed, but only abnormal results are displayed) Labs Reviewed - No data to display  EKG  EKG Interpretation None       Radiology No results found.  Procedures Procedures  DIAGNOSTIC STUDIES: Oxygen Saturation is 98% on RA, normal by my interpretation.    COORDINATION OF CARE: 2:20 PM Discussed next steps with pt. Pt verbalized understanding and is agreeable with the plan.    Medications Ordered in ED Medications  albuterol (PROVENTIL HFA;VENTOLIN HFA) 108 (90 Base) MCG/ACT inhaler 2 puff (not administered)     Initial Impression / Assessment and Plan / ED Course  I have reviewed the triage vital signs and the nursing notes.  Pertinent labs & imaging results that were available during my care of the patient were reviewed by me and considered in my medical decision making (see chart for details).  Clinical Course    On exam the patient is afebrile nontoxic appearing. She denies current shortness of breath. Her lungs are clear to auscultation bilaterally. No increased work of breathing. Will refill her Symbicort and albuterol. Patient started albuterol inhaler in the emergency department. Encouraged her to work on obtaining follow-up with primary care once her insurance starts and 3 days. I discussed return precautions. I advised the patient to follow-up with  their primary care provider this week. I advised the patient to return to the emergency department with new or worsening symptoms or new concerns. The patient verbalized understanding and agreement with plan.    I personally performed the services described in this documentation, which was scribed in my presence. The recorded information has been reviewed and is accurate.       Final Clinical Impressions(s) / ED Diagnoses   Final diagnoses:  Encounter for medication refill  Asthma, unspecified asthma severity, uncomplicated    New Prescriptions New Prescriptions   ALBUTEROL (PROVENTIL HFA;VENTOLIN HFA) 108 (90 BASE) MCG/ACT INHALER    Inhale 1-2 puffs into the lungs every 6 (six) hours as needed for wheezing or shortness of breath.     Waynetta Pean, PA-C 04/22/16 [place], MD 04/23/16 [phone]

## 2016-07-14 ENCOUNTER — Ambulatory Visit (INDEPENDENT_AMBULATORY_CARE_PROVIDER_SITE_OTHER): Payer: 59 | Admitting: Internal Medicine

## 2016-07-14 ENCOUNTER — Encounter: Payer: Self-pay | Admitting: Internal Medicine

## 2016-07-14 VITALS — BP 122/82 | HR 79 | Temp 97.9°F | Ht 62.0 in | Wt >= 6400 oz

## 2016-07-14 DIAGNOSIS — J449 Chronic obstructive pulmonary disease, unspecified: Secondary | ICD-10-CM

## 2016-07-14 DIAGNOSIS — L509 Urticaria, unspecified: Secondary | ICD-10-CM | POA: Diagnosis not present

## 2016-07-14 DIAGNOSIS — D72829 Elevated white blood cell count, unspecified: Secondary | ICD-10-CM | POA: Diagnosis not present

## 2016-07-14 DIAGNOSIS — D500 Iron deficiency anemia secondary to blood loss (chronic): Secondary | ICD-10-CM | POA: Diagnosis not present

## 2016-07-14 DIAGNOSIS — J4540 Moderate persistent asthma, uncomplicated: Secondary | ICD-10-CM

## 2016-07-14 LAB — CBC WITH DIFFERENTIAL/PLATELET
BASOS ABS: 0 {cells}/uL (ref 0–200)
Basophils Relative: 0 %
EOS PCT: 6 %
Eosinophils Absolute: 510 cells/uL — ABNORMAL HIGH (ref 15–500)
HEMATOCRIT: 35.8 % (ref 35.0–45.0)
HEMOGLOBIN: 11 g/dL — AB (ref 11.7–15.5)
LYMPHS ABS: 2210 {cells}/uL (ref 850–3900)
Lymphocytes Relative: 26 %
MCH: 24.6 pg — ABNORMAL LOW (ref 27.0–33.0)
MCHC: 30.7 g/dL — AB (ref 32.0–36.0)
MCV: 80.1 fL (ref 80.0–100.0)
MONO ABS: 510 {cells}/uL (ref 200–950)
MPV: 9 fL (ref 7.5–12.5)
Monocytes Relative: 6 %
NEUTROS ABS: 5270 {cells}/uL (ref 1500–7800)
NEUTROS PCT: 62 %
Platelets: 285 10*3/uL (ref 140–400)
RBC: 4.47 MIL/uL (ref 3.80–5.10)
RDW: 15.3 % — ABNORMAL HIGH (ref 11.0–15.0)
WBC: 8.5 10*3/uL (ref 3.8–10.8)

## 2016-07-14 MED ORDER — BUDESONIDE-FORMOTEROL FUMARATE 160-4.5 MCG/ACT IN AERO: 2.0000 | INHALATION_SPRAY | Freq: Two times a day (BID) | RESPIRATORY_TRACT | 6 refills | Status: DC

## 2016-07-14 MED ORDER — MONTELUKAST SODIUM 10 MG PO TABS: 10.0000 mg | ORAL_TABLET | Freq: Every day | ORAL | 6 refills | Status: DC

## 2016-07-14 NOTE — Progress Notes (Signed)
Patient ID: April Ellis, female   DOB: 1976/01/27, 40 y.o.   MRN: [id number]    Location:  [facility] Place of Service: OFFICE    Advanced Directive information Does patient have an advance directive?: No, Would patient like information on creating an advanced directive?: No - patient declined information  Chief Complaint  Patient presents with  . Establish Care    new patient to establish care  . Flu Vaccine    Refused    HPI:  40 yo female seen today as a new pt. She has not seen a provider in several mos due to lack of insurance coverage. She needs med RF.  COPD/asthma - on proventil HFA, albuterol nebs prn, symbicort and singulair. She rec'd RF in the ED in July 2017. Last PFTs in 2015  OSA - pt denies. She never completed sleep study and was not formally dx  Morbid Obesity - gained 8 lbs since Dec 2016. She has tried to lose weight on her own by cutting calories, various diets and exercise without success. She plans to meet with personal trainer this weekend  Hx iron deficiency anemia - Hgb 12 in Dec 2016; MCV 77. She still has menses monthly and they are regular. No heavy bleeding or clots.  Leukocytosis - chronic. WBC 16K in Dec 2016  Past Medical History:  Diagnosis Date  . Anemia   . Asthma   . Bronchitis   . COPD (chronic obstructive pulmonary disease) ([place])   . Obesity     Past Surgical History:  Procedure Laterality Date  . DENTAL SURGERY     2 teeth pulled, 11/22/14  . NO PAST SURGERIES      Patient Care Team: No Pcp Per Patient as PCP - General (General Practice)  Social History   Social History  . Marital status: Single    Spouse name: N/A  . Number of children: N/A  . Years of education: N/A   Occupational History  . customer service rep    Social History Main Topics  . Smoking status: Former Smoker    Packs/day: 0.25    Years: 15.00    Types: Cigarettes    Quit date: 09/16/2014  . Smokeless tobacco: Never Used  . Alcohol use No  . Drug  use: No  . Sexual activity: Not Currently   Other Topics Concern  . Not on file   Social History Narrative  . No narrative on file     reports that she quit smoking about 21 months ago. Her smoking use included Cigarettes. She has a 3.75 pack-year smoking history. She has never used smokeless tobacco. She reports that she does not drink alcohol or use drugs.  Family History  Problem Relation Age of Onset  . Diabetes Mother   . Diabetes Sister   . Hypertension Sister   . Hypertension Sister    Family Status  Relation Status  . Mother Alive  . Father Alive  . Sister Alive  . Brother Alive  . Maternal Grandmother Deceased  . Maternal Grandfather Deceased  . Paternal Grandmother Deceased  . Paternal Grandfather Deceased  . Sister Alive  . Sister Alive    Immunization History  Administered Date(s) Administered  . Td 12/19/2008    No Known Allergies  Medications: Patient's Medications  New Prescriptions   No medications on file  Previous Medications   ALBUTEROL (PROVENTIL HFA;VENTOLIN HFA) 108 (90 BASE) MCG/ACT INHALER    Inhale 1-2 puffs into the lungs every 6 (six)  hours as needed for wheezing or shortness of breath.   ALBUTEROL (PROVENTIL) (2.5 MG/3ML) 0.083% NEBULIZER SOLUTION    Take 3 mLs (2.5 mg total) by nebulization every 6 (six) hours as needed for wheezing or shortness of breath.   BUDESONIDE-FORMOTEROL (SYMBICORT) 160-4.5 MCG/ACT INHALER    Inhale 2 puffs into the lungs 2 (two) times daily.   MONTELUKAST (SINGULAIR) 10 MG TABLET    Take 1 tablet (10 mg total) by mouth at bedtime.  Modified Medications   No medications on file  Discontinued Medications   CARBAMAZEPINE (TEGRETOL-XR) 200 MG 12 HR TABLET    Take 1 tablet (200 mg total) by mouth 2 (two) times daily.   DEXTROMETHORPHAN-GUAIFENESIN (MUCINEX DM) 30-600 MG 12HR TABLET    Take 1 tablet by mouth 2 (two) times daily as needed for cough.   FERROUS SULFATE 325 (65 FE) MG TABLET    Take 325 mg by mouth  daily with breakfast. Reported on 12/31/2015   IPRATROPIUM (ATROVENT) 0.02 % NEBULIZER SOLUTION    Take 2.5 mLs (0.5 mg total) by nebulization 4 (four) times daily.   PREDNISONE (DELTASONE) 20 MG TABLET    Take 2 tablets (40 mg total) by mouth daily.    Review of Systems  HENT: Positive for postnasal drip.   Eyes: Positive for visual disturbance (wears eyeglasses).  Respiratory: Positive for shortness of breath.   Skin:       Urticaria - intermittent and unknown etiology. She takes benadryl prn. No skin prick tests ever done but had blood tests that revealed no allergens  All other systems reviewed and are negative.   Vitals:   07/14/16 0849  BP: 122/82  Pulse: 79  Temp: 97.9 F (36.6 C)  TempSrc: Oral  SpO2: 98%  Weight: (!) 420 lb 3.2 oz (190.6 kg)  Height: '5\' 2"'  (1.575 m)   Body mass index is 76.86 kg/m.  Physical Exam  Constitutional: She is oriented to person, place, and time. She appears well-developed and well-nourished.  HENT:  Mouth/Throat: Oropharynx is clear and moist. No oropharyngeal exudate.  Eyes: Pupils are equal, round, and reactive to light. No scleral icterus.  Neck: Neck supple. Carotid bruit is not present. No tracheal deviation present. No thyromegaly present.  Cardiovascular: Normal rate, regular rhythm, normal heart sounds and intact distal pulses.  Exam reveals no gallop and no friction rub.   No murmur heard. No LE edema b/l. no calf TTP.   Pulmonary/Chest: Effort normal and breath sounds normal. No stridor. No respiratory distress. She has no wheezes. She has no rales.  Abdominal: Soft. Bowel sounds are normal. She exhibits no distension and no mass. There is no hepatomegaly. There is no tenderness. There is no rebound and no guarding.  Lymphadenopathy:    She has no cervical adenopathy.  Neurological: She is alert and oriented to person, place, and time.  Skin: Skin is warm and dry. No rash noted.  Psychiatric: She has a normal mood and affect. Her  behavior is normal. Thought content normal.     Labs reviewed: Office Visit on 07/14/2016  Component Date Value Ref Range Status  . HM Pap smear 09/27/2013 normal   Final    No results found.   Assessment/Plan   ICD-9-CM ICD-10-CM   1. Urticaria 708.9 L50.9 CBC with Differential/Platelet     TSH     Ambulatory referral to Allergy  2. Chronic obstructive pulmonary disease, unspecified COPD type (HCC) 496 J44.9 budesonide-formoterol (SYMBICORT) 160-4.5 MCG/ACT inhaler  CBC with Differential/Platelet  3. Moderate persistent asthma without complication 921.19 E17.40 montelukast (SINGULAIR) 10 MG tablet     budesonide-formoterol (SYMBICORT) 160-4.5 MCG/ACT inhaler     TSH  4. Iron deficiency anemia due to chronic blood loss 280.0 D50.0 CBC with Differential/Platelet  5. Morbid obesity ([place]) 278.01 E66.01 CMP with eGFR     Hemoglobin A1C     Lipid panel     TSH  6. Leukocytosis, unspecified type 288.60 D72.829 CBC with Differential/Platelet     TSH   Continue weight loss efforts. T/c pharmacologic tx if no response. Goal is 10% weight reduction (at least 40 lb)  Will call with lab results  Continue current medications as ordered  Follow up for [facility]. Perlie Gold  [facility] [address] [place], [place] [address] [phone] Cell (Monday-Friday 8 AM - 5 PM) [phone] After 5 PM and follow prompts

## 2016-07-14 NOTE — Patient Instructions (Addendum)
Continue weight loss efforts  Will call with lab results  Continue current medications as ordered  Follow up for CPE/pap/ECG

## 2016-07-15 LAB — LIPID PANEL
Cholesterol: 134 mg/dL (ref 125–200)
HDL: 82 mg/dL (ref >=46)
LDL CALC: 38 mg/dL (ref <130)
Total CHOL/HDL Ratio: 1.6 Ratio (ref <=5.0)
Triglycerides: 70 mg/dL (ref <150)
VLDL: 14 mg/dL (ref <30)

## 2016-07-15 LAB — COMPLETE METABOLIC PANEL WITH GFR
ALBUMIN: 3.6 g/dL (ref 3.6–5.1)
ALT: 7 U/L (ref 6–29)
AST: 11 U/L (ref 10–30)
Alkaline Phosphatase: 101 U/L (ref 33–115)
BILIRUBIN TOTAL: 0.6 mg/dL (ref 0.2–1.2)
BUN: 8 mg/dL (ref 7–25)
CALCIUM: 9.2 mg/dL (ref 8.6–10.2)
CO2: 29 mmol/L (ref 20–31)
Chloride: 101 mmol/L (ref 98–110)
Creat: 0.71 mg/dL (ref 0.50–1.10)
GFR, Est African American: >89 mL/min (ref >=60)
GLUCOSE: 92 mg/dL (ref 65–99)
Potassium: 4.3 mmol/L (ref 3.5–5.3)
SODIUM: 136 mmol/L (ref 135–146)
TOTAL PROTEIN: 7.8 g/dL (ref 6.1–8.1)

## 2016-07-15 LAB — TSH: TSH: 2.33 mIU/L

## 2016-07-15 LAB — HEMOGLOBIN A1C
HEMOGLOBIN A1C: 5 % (ref <5.7)
Mean Plasma Glucose: 97 mg/dL

## 2016-08-16 ENCOUNTER — Ambulatory Visit (INDEPENDENT_AMBULATORY_CARE_PROVIDER_SITE_OTHER): Payer: 59 | Admitting: Allergy and Immunology

## 2016-08-16 ENCOUNTER — Encounter: Payer: Self-pay | Admitting: Allergy and Immunology

## 2016-08-16 VITALS — BP 118/70 | HR 85 | Temp 97.7°F | Resp 16 | Ht 61.5 in | Wt >= 6400 oz

## 2016-08-16 DIAGNOSIS — L509 Urticaria, unspecified: Secondary | ICD-10-CM | POA: Diagnosis not present

## 2016-08-16 DIAGNOSIS — T7840XA Allergy, unspecified, initial encounter: Secondary | ICD-10-CM

## 2016-08-16 DIAGNOSIS — J300 Vasomotor rhinitis: Secondary | ICD-10-CM | POA: Diagnosis not present

## 2016-08-16 DIAGNOSIS — J310 Chronic rhinitis: Secondary | ICD-10-CM | POA: Insufficient documentation

## 2016-08-16 DIAGNOSIS — J4540 Moderate persistent asthma, uncomplicated: Secondary | ICD-10-CM

## 2016-08-16 MED ORDER — AZELASTINE HCL 0.15 % NA SOLN: 2.0000 | Freq: Two times a day (BID) | NASAL | 5 refills | Status: DC

## 2016-08-16 MED ORDER — LEVOCETIRIZINE DIHYDROCHLORIDE 5 MG PO TABS: 5.0000 mg | ORAL_TABLET | Freq: Every evening | ORAL | 5 refills | Status: DC

## 2016-08-16 NOTE — Assessment & Plan Note (Signed)
Well-controlled on current regimen.  For now, continue Symbicort 160/4.5 g, 2 inhalations twice a day, montelukast 10 mg daily bedtime, and albuterol HFA, 1-2 inhalations every 4-6 hours as needed.  To maximize pulmonary deposition, a spacer has been provided along with instructions for its proper administration with an HFA inhaler.  Subjective and objective measures of pulmonary function will be followed and the treatment plan will be adjusted accordingly.

## 2016-08-16 NOTE — Progress Notes (Signed)
New Patient Note  RE: April Ellis MRN: [id number] DOB: 1976/09/06 Date of Office Visit: 08/16/2016  Referring provider: Gildardo Cranker, DO Primary care provider: No PCP Per Patient  Chief Complaint: Allergic Reaction; Urticaria; and Nasal Congestion   History of present illness: April Ellis is a 40 y.o. female seen today in consultation requested by Gildardo Cranker, DO. Over the past 5 years, April Ellis has experienced recurrent episodes of hives. Typical distribution includes the arms.  The lesions are described as erythematous, raised, and pruritic.  Individual hives last less than 24 hours without leaving residual pigmentation or bruising.  She reports that she occasionally experiences concomitant sensation of throat tightness and chest tightness.  However, she is uncertain if the sensation of throat tightness and chest tightness is a component of allergic reaction or due to anxiety associated with onset of hives.  The symptoms typically resolve within a few hours of taking diphenhydramine.  She experiences hives approximately one time per month on average.  No specific medication, food, or environmental triggers have been identified.  Initially she thought that it was due to shellfish, however the hives occurred in the absence of shellfish consumption.  She has an EpiPen, however she believes that it has expired. She was diagnosed with asthma approximately 2 years ago.  She reports that the asthma currently is well-controlled with Symbicort 160/4.5 g, 2 inhalations twice a day, and montelukast 10 mg daily at bedtime.  She rarely requires albuterol rescue and denies nocturnal awakenings due to lower respiratory symptoms.  She experiences frequent nasal congestion and rhinorrhea.   No significant seasonal symptom variation has been noted nor have specific environmental triggers been identified.   Assessment and plan: Allergic reactions The patient's history suggests allergic reaction with an  unclear trigger versus recurrent urticaria with associated sensation of throat tightness secondary to anxiety. Food allergen skin tests were negative today despite a positive histamine control. The negative predictive value for skin tests is excellent (greater than 95%). We will proceed with in vitro lab studies to clarify the etiology.  The following labs have been ordered: FCeRI antibody, TSH, anti-thyroglobulin antibody, thyroid peroxidase antibody, baseline serum tryptase, CBC, CMP, ESR, ANA, and serum specific IgE against shellfish panel and galactose-alpha-1,3-galactose. An additional lab order for serum tryptase has been provided which is to be kept by the patient to be drawn in the emergency department within 4 hours of symptom onset should symptoms recur.  A prescription has been provided for levocetirizine, 40m daily as needed.  Should symptoms recur, the patient has been asked to keep a journal to record any foods eaten, beverages consumed, medications taken within a 6 hour period prior to the onset of symptoms, as well as record activities being performed, and environmental conditions. For any symptoms concerning for anaphylaxis, epinephrine is to be administered and 911 is to be called immediately.  Continue to have access to epinephrine autoinjectors in case of systemic symptoms.  Urticaria  Diagnostics/treatment plan as outlined above.  Chronic rhinitis  A prescription has been provided for azelastine nasal spray, 2 sprays per nostril 1-2 times daily as needed.  I have also recommended nasal saline spray (i.e., Simply Saline) or nasal saline lavage (i.e., NeilMed) as needed prior to medicated nasal sprays.  Moderate persistent asthma Well-controlled on current regimen.  For now, continue Symbicort 160/4.5 g, 2 inhalations twice a day, montelukast 10 mg daily bedtime, and albuterol HFA, 1-2 inhalations every 4-6 hours as needed.  To maximize pulmonary deposition, a  spacer has  been provided along with instructions for its proper administration with an HFA inhaler.  Subjective and objective measures of pulmonary function will be followed and the treatment plan will be adjusted accordingly.   Meds ordered this encounter  Medications  . levocetirizine (XYZAL) 5 MG tablet    Sig: Take 1 tablet (5 mg total) by mouth every evening.    Dispense:  30 tablet    Refill:  5  . Azelastine HCl 0.15 % SOLN    Sig: Place 2 sprays into both nostrils 2 (two) times daily.    Dispense:  30 mL    Refill:  5    Diagnostics: Spirometry: FVC is 2.97 L (112% predicted) and FEV1 is 2.02 L (89% predicted), FEV1 ratio 79% predicted.  Mild airways obstruction without significant postbronchodilator improvement.  Please see scanned spirometry results for details. Environmental skin testing:  Negative despite a positive histamine control. Food allergen skin testing:  Negative despite a positive histamine control.    Physical examination: Blood pressure 118/70, pulse 85, temperature 97.7 F (36.5 C), temperature source Oral, resp. rate 16, height 5' 1.5" (1.562 m), weight (!) 425 lb (192.8 kg), SpO2 97 %.  General: Alert, interactive, in no acute distress. HEENT: TMs pearly gray, turbinates moderately edematous without discharge, post-pharynx moderately erythematous. Neck: Supple without lymphadenopathy. Lungs: Mildly decreased breath sounds bilaterally without wheezing, rhonchi or rales. CV: Normal S1, S2 without murmurs. Abdomen: Nondistended, nontender. Skin: Warm and dry, without lesions or rashes. Extremities:  No clubbing, cyanosis or edema. Neuro:   Grossly intact.  Review of systems:  Review of systems negative except as noted in HPI / PMHx or noted below: Review of Systems  Constitutional: Negative.   HENT: Negative.   Eyes: Negative.   Respiratory: Negative.   Cardiovascular: Negative.   Gastrointestinal: Negative.   Genitourinary: Negative.   Musculoskeletal:  Negative.   Skin: Negative.   Neurological: Negative.   Endo/Heme/Allergies: Negative.   Psychiatric/Behavioral: Negative.     Past medical history:  Past Medical History:  Diagnosis Date  . Anemia   . Asthma   . Bronchitis   . COPD (chronic obstructive pulmonary disease) ([place])   . Obesity     Past surgical history:  Past Surgical History:  Procedure Laterality Date  . DENTAL SURGERY     2 teeth pulled, 11/22/14  . NO PAST SURGERIES      Family history: Family History  Problem Relation Age of Onset  . Diabetes Mother   . Cancer Mother   . Diabetes Sister   . Hypertension Sister   . Hypertension Sister   . Urticaria Neg Hx   . Immunodeficiency Neg Hx   . Eczema Neg Hx   . Asthma Neg Hx   . Angioedema Neg Hx   . Allergic rhinitis Neg Hx     Social history: Social History   Social History  . Marital status: Single    Spouse name: N/A  . Number of children: N/A  . Years of education: N/A   Occupational History  . customer service rep    Social History Main Topics  . Smoking status: Former Smoker    Packs/day: 0.25    Years: 15.00    Types: Cigarettes    Quit date: 09/16/2014  . Smokeless tobacco: Never Used  . Alcohol use No  . Drug use: No  . Sexual activity: Not Currently   Other Topics Concern  . Not on file   Social History Narrative  DIET: regular      DO YOU DRINK/EAT THINGS WITH CAFFEINE: Yes      MARITAL STATUS: Single      WHAT YEAR WERE YOU MARRIED:       DO YOU LIVE IN A HOUSE, APARTMENT, ASSISTED LIVING, CONDO TRAILER ETC.:  House       IS IT ONE OR MORE STORIES: 1      HOW MANY PERSONS LIVE IN YOUR HOME: 3      DO YOU HAVE PETS IN YOUR HOME: No      CURRENT OR PAST PROFESSION: customer Service Rep      DO YOU EXERCISE: very little      WHAT TYPE AND HOW OFTEN:   Environmental History: The patient lives in a house with hardwood floors throughout, gas heat, and central air.  She Is a former cigarette smoker having quit  in 2015.  She has no pets in the home.    Medication List       Accurate as of 08/16/16  1:30 PM. Always use your most recent med list.          albuterol (2.5 MG/3ML) 0.083% nebulizer solution Commonly known as:  PROVENTIL Take 3 mLs (2.5 mg total) by nebulization every 6 (six) hours as needed for wheezing or shortness of breath.   albuterol 108 (90 Base) MCG/ACT inhaler Commonly known as:  PROVENTIL HFA;VENTOLIN HFA Inhale 1-2 puffs into the lungs every 6 (six) hours as needed for wheezing or shortness of breath.   Azelastine HCl 0.15 % Soln Place 2 sprays into both nostrils 2 (two) times daily.   budesonide-formoterol 160-4.5 MCG/ACT inhaler Commonly known as:  SYMBICORT Inhale 2 puffs into the lungs 2 (two) times daily.   levocetirizine 5 MG tablet Commonly known as:  XYZAL Take 1 tablet (5 mg total) by mouth every evening.   montelukast 10 MG tablet Commonly known as:  SINGULAIR Take 1 tablet (10 mg total) by mouth at bedtime.       Known medication allergies: No Known Allergies  I appreciate the opportunity to take part in Rajean's care. Please do not hesitate to contact me with questions.  Sincerely,   R. Edgar Frisk, MD

## 2016-08-16 NOTE — Assessment & Plan Note (Signed)
   Diagnostics/treatment plan as outlined above.

## 2016-08-16 NOTE — Assessment & Plan Note (Signed)
   A prescription has been provided for azelastine nasal spray, 2 sprays per nostril 1-2 times daily as needed.  I have also recommended nasal saline spray (i.e., Simply Saline) or nasal saline lavage (i.e., NeilMed) as needed prior to medicated nasal sprays.

## 2016-08-16 NOTE — Assessment & Plan Note (Addendum)
The patient's history suggests allergic reaction with an unclear trigger versus recurrent urticaria with associated sensation of throat tightness secondary to anxiety. Food allergen skin tests were negative today despite a positive histamine control. The negative predictive value for skin tests is excellent (greater than 95%). We will proceed with in vitro lab studies to clarify the etiology.  The following labs have been ordered: FCeRI antibody, TSH, anti-thyroglobulin antibody, thyroid peroxidase antibody, baseline serum tryptase, CBC, CMP, ESR, ANA, and serum specific IgE against shellfish panel and galactose-alpha-1,3-galactose. An additional lab order for serum tryptase has been provided which is to be kept by the patient to be drawn in the emergency department within 4 hours of symptom onset should symptoms recur.  A prescription has been provided for levocetirizine, 29m daily as needed.  Should symptoms recur, the patient has been asked to keep a journal to record any foods eaten, beverages consumed, medications taken within a 6 hour period prior to the onset of symptoms, as well as record activities being performed, and environmental conditions. For any symptoms concerning for anaphylaxis, epinephrine is to be administered and 911 is to be called immediately.  Continue to have access to epinephrine autoinjectors in case of systemic symptoms.

## 2016-08-16 NOTE — Patient Instructions (Addendum)
Allergic reactions The patient's history suggests allergic reaction with an unclear trigger versus recurrent urticaria with associated sensation of throat tightness secondary to anxiety. Food allergen skin tests were negative today despite a positive histamine control. The negative predictive value for skin tests is excellent (greater than 95%). We will proceed with in vitro lab studies to clarify the etiology.  The following labs have been ordered: FCeRI antibody, TSH, anti-thyroglobulin antibody, thyroid peroxidase antibody, baseline serum tryptase, CBC, CMP, ESR, ANA, and serum specific IgE against shellfish panel and galactose-alpha-1,3-galactose. An additional lab order for serum tryptase has been provided which is to be kept by the patient to be drawn in the emergency department within 4 hours of symptom onset should symptoms recur.  A prescription has been provided for levocetirizine, 66m daily as needed.  Should symptoms recur, the patient has been asked to keep a journal to record any foods eaten, beverages consumed, medications taken within a 6 hour period prior to the onset of symptoms, as well as record activities being performed, and environmental conditions. For any symptoms concerning for anaphylaxis, epinephrine is to be administered and 911 is to be called immediately.  Continue to have access to epinephrine autoinjectors in case of systemic symptoms.  Urticaria  Diagnostics/treatment plan as outlined above.  Chronic rhinitis  A prescription has been provided for azelastine nasal spray, 2 sprays per nostril 1-2 times daily as needed.  I have also recommended nasal saline spray (i.e., Simply Saline) or nasal saline lavage (i.e., NeilMed) as needed prior to medicated nasal sprays.  Moderate persistent asthma Well-controlled on current regimen.  For now, continue Symbicort 160/4.5 g, 2 inhalations twice a day, montelukast 10 mg daily bedtime, and albuterol HFA, 1-2 inhalations  every 4-6 hours as needed.  To maximize pulmonary deposition, a spacer has been provided along with instructions for its proper administration with an HFA inhaler.  Subjective and objective measures of pulmonary function will be followed and the treatment plan will be adjusted accordingly.   When lab results have returned the patient will be called with further recommendations and follow up instructions.

## 2016-08-23 ENCOUNTER — Telehealth: Payer: Self-pay

## 2016-08-23 DIAGNOSIS — J4540 Moderate persistent asthma, uncomplicated: Secondary | ICD-10-CM

## 2016-08-23 DIAGNOSIS — J449 Chronic obstructive pulmonary disease, unspecified: Secondary | ICD-10-CM

## 2016-08-23 NOTE — Telephone Encounter (Signed)
Message left on triage voicemail: Patient needs authorization on medication. Please call to discuss.  Left message on voicemail for patient to return call when available , reason for call: Clarify which medication patient needs a refill on.

## 2016-08-26 MED ORDER — BUDESONIDE-FORMOTEROL FUMARATE 160-4.5 MCG/ACT IN AERO: 2.0000 | INHALATION_SPRAY | Freq: Two times a day (BID) | RESPIRATORY_TRACT | 6 refills | Status: DC

## 2016-08-26 MED ORDER — MONTELUKAST SODIUM 10 MG PO TABS: 10.0000 mg | ORAL_TABLET | Freq: Every day | ORAL | 6 refills | Status: DC

## 2016-08-26 NOTE — Telephone Encounter (Signed)
Message left on Clinical Intake line: Patient returned call, needs refill on Symbicort  RX sent.   Fax was also received for refill on [facility] from [facility] [address]. Rx sent also

## 2016-09-07 ENCOUNTER — Other Ambulatory Visit: Payer: Self-pay | Admitting: *Deleted

## 2016-09-07 DIAGNOSIS — J4540 Moderate persistent asthma, uncomplicated: Secondary | ICD-10-CM

## 2016-09-07 MED ORDER — MONTELUKAST SODIUM 10 MG PO TABS: 10.0000 mg | ORAL_TABLET | Freq: Every day | ORAL | 1 refills | Status: DC

## 2016-09-07 NOTE — Telephone Encounter (Signed)
[facility] [address] [facility] 

## 2016-10-08 ENCOUNTER — Encounter: Payer: 59 | Admitting: Internal Medicine

## 2016-10-25 ENCOUNTER — Other Ambulatory Visit: Payer: Self-pay

## 2016-10-25 DIAGNOSIS — L509 Urticaria, unspecified: Secondary | ICD-10-CM

## 2016-10-25 DIAGNOSIS — J300 Vasomotor rhinitis: Secondary | ICD-10-CM

## 2016-10-25 MED ORDER — LEVOCETIRIZINE DIHYDROCHLORIDE 5 MG PO TABS
5.0000 mg | ORAL_TABLET | Freq: Every evening | ORAL | 1 refills | Status: DC
Start: 2016-10-25 — End: 2016-11-04

## 2016-10-25 MED ORDER — AZELASTINE HCL 0.15 % NA SOLN: 2.0000 | Freq: Two times a day (BID) | NASAL | 1 refills | Status: DC

## 2016-11-04 ENCOUNTER — Other Ambulatory Visit: Payer: Self-pay

## 2016-11-04 DIAGNOSIS — L509 Urticaria, unspecified: Secondary | ICD-10-CM

## 2016-11-04 MED ORDER — LEVOCETIRIZINE DIHYDROCHLORIDE 5 MG PO TABS: 5.0000 mg | ORAL_TABLET | Freq: Every evening | ORAL | 0 refills | Status: DC

## 2016-11-08 ENCOUNTER — Other Ambulatory Visit: Payer: Self-pay | Admitting: *Deleted

## 2016-11-11 ENCOUNTER — Encounter: Payer: Self-pay | Admitting: Internal Medicine

## 2016-11-18 ENCOUNTER — Telehealth (HOSPITAL_COMMUNITY): Payer: Self-pay

## 2016-11-18 NOTE — Telephone Encounter (Signed)
11/18/2016  Attempted to call patient to schedule for new pharmacy service regarding inhaler education and technique. Left HIPAA compliant message with clinic phone number to call to schedule and Dr. Kirby Funk phone number for more information regarding service.  Belia Heman, PharmD PGY1 Pharmacy Resident [phone] (Pager) 11/18/2016 2:46 PM

## 2016-12-07 ENCOUNTER — Telehealth: Payer: Self-pay

## 2016-12-07 NOTE — Telephone Encounter (Signed)
12/07/2016  Contacted [name] today to attempt to schedule with [facility] pharmacist for new pharmacy service regarding inhaler education and use. Left HIPAA compliant message for patient to call Dr. Theda Sers to schedule an appointment.   April Ellis, PharmD PGY1 Pharmacy Resident [phone] (Pager)

## 2016-12-22 DIAGNOSIS — N926 Irregular menstruation, unspecified: Secondary | ICD-10-CM | POA: Insufficient documentation

## 2016-12-27 LAB — HM PAP SMEAR: HM PAP: NORMAL

## 2016-12-28 DIAGNOSIS — B977 Papillomavirus as the cause of diseases classified elsewhere: Secondary | ICD-10-CM | POA: Insufficient documentation

## 2016-12-31 ENCOUNTER — Encounter: Payer: 59 | Admitting: Internal Medicine

## 2017-02-24 ENCOUNTER — Other Ambulatory Visit: Payer: Self-pay | Admitting: Internal Medicine

## 2017-03-22 ENCOUNTER — Other Ambulatory Visit: Payer: Self-pay

## 2017-03-22 MED ORDER — FLUTICASONE-SALMETEROL 115-21 MCG/ACT IN AERO: INHALATION_SPRAY | RESPIRATORY_TRACT | 1 refills | Status: DC

## 2017-03-22 NOTE — Telephone Encounter (Signed)
A [facility] stating that patient's insurance requires that a 90 day refill be sent.   Rx sent to pharmacy electronically

## 2017-03-23 ENCOUNTER — Encounter: Payer: Self-pay | Admitting: Internal Medicine

## 2017-03-23 ENCOUNTER — Ambulatory Visit (INDEPENDENT_AMBULATORY_CARE_PROVIDER_SITE_OTHER): Payer: 59 | Admitting: Internal Medicine

## 2017-03-23 VITALS — BP 124/76 | HR 96 | Temp 98.0°F | Ht 62.0 in | Wt >= 6400 oz

## 2017-03-23 DIAGNOSIS — J449 Chronic obstructive pulmonary disease, unspecified: Secondary | ICD-10-CM

## 2017-03-23 DIAGNOSIS — L732 Hidradenitis suppurativa: Secondary | ICD-10-CM

## 2017-03-23 DIAGNOSIS — D72829 Elevated white blood cell count, unspecified: Secondary | ICD-10-CM | POA: Diagnosis not present

## 2017-03-23 DIAGNOSIS — J4540 Moderate persistent asthma, uncomplicated: Secondary | ICD-10-CM | POA: Diagnosis not present

## 2017-03-23 DIAGNOSIS — N926 Irregular menstruation, unspecified: Secondary | ICD-10-CM | POA: Diagnosis not present

## 2017-03-23 DIAGNOSIS — Z0000 Encounter for general adult medical examination without abnormal findings: Secondary | ICD-10-CM

## 2017-03-23 DIAGNOSIS — D500 Iron deficiency anemia secondary to blood loss (chronic): Secondary | ICD-10-CM | POA: Diagnosis not present

## 2017-03-23 LAB — LIPID PANEL
CHOL/HDL RATIO: 1.7 ratio (ref <5.0)
CHOLESTEROL: 104 mg/dL (ref <200)
HDL: 63 mg/dL (ref >50)
LDL CALC: 29 mg/dL (ref <100)
TRIGLYCERIDES: 58 mg/dL (ref <150)
VLDL: 12 mg/dL (ref <30)

## 2017-03-23 LAB — COMPLETE METABOLIC PANEL WITH GFR
ALT: 7 U/L (ref 6–29)
AST: 10 U/L (ref 10–30)
Albumin: 3.3 g/dL — ABNORMAL LOW (ref 3.6–5.1)
Alkaline Phosphatase: 88 U/L (ref 33–115)
BUN: 7 mg/dL (ref 7–25)
CALCIUM: 8.4 mg/dL — AB (ref 8.6–10.2)
CHLORIDE: 104 mmol/L (ref 98–110)
CO2: 26 mmol/L (ref 20–31)
Creat: 0.71 mg/dL (ref 0.50–1.10)
GFR, Est Non African American: >89 mL/min (ref >=60)
Glucose, Bld: 120 mg/dL — ABNORMAL HIGH (ref 65–99)
POTASSIUM: 4.5 mmol/L (ref 3.5–5.3)
Sodium: 137 mmol/L (ref 135–146)
Total Bilirubin: 0.5 mg/dL (ref 0.2–1.2)
Total Protein: 7.4 g/dL (ref 6.1–8.1)

## 2017-03-23 LAB — CBC WITH DIFFERENTIAL/PLATELET
BASOS PCT: 0 %
Basophils Absolute: 0 cells/uL (ref 0–200)
Eosinophils Absolute: 388 cells/uL (ref 15–500)
Eosinophils Relative: 4 %
HEMATOCRIT: 25.8 % — AB (ref 35.0–45.0)
Hemoglobin: 7.1 g/dL — ABNORMAL LOW (ref 11.7–15.5)
LYMPHS PCT: 24 %
Lymphs Abs: 2328 cells/uL (ref 850–3900)
MCH: 19.6 pg — ABNORMAL LOW (ref 27.0–33.0)
MCHC: 27.5 g/dL — AB (ref 32.0–36.0)
MCV: 71.3 fL — ABNORMAL LOW (ref 80.0–100.0)
MONO ABS: 679 {cells}/uL (ref 200–950)
MONOS PCT: 7 %
MPV: 8.3 fL (ref 7.5–12.5)
NEUTROS PCT: 65 %
Neutro Abs: 6305 cells/uL (ref 1500–7800)
PLATELETS: 358 10*3/uL (ref 140–400)
RBC: 3.62 MIL/uL — AB (ref 3.80–5.10)
RDW: 19.1 % — AB (ref 11.0–15.0)
WBC: 9.7 10*3/uL (ref 3.8–10.8)

## 2017-03-23 LAB — IRON AND TIBC
%SAT: 1 % — ABNORMAL LOW (ref 11–50)
Iron: <10 ug/dL — ABNORMAL LOW (ref 40–190)
TIBC: 341 ug/dL (ref 250–450)
UIBC: 336 ug/dL

## 2017-03-23 LAB — TSH: TSH: 1.93 m[IU]/L

## 2017-03-23 MED ORDER — PHENTERMINE HCL 30 MG PO CAPS: 30.0000 mg | ORAL_CAPSULE | ORAL | 3 refills | Status: DC

## 2017-03-23 NOTE — Progress Notes (Signed)
Patient ID: April Ellis, female   DOB: 1975/12/24, 41 y.o.   MRN: [id number]   Location:  [facility]  Place of Service:  OFFICE  Provider: Arletha Grippe, DO  Patient Care Team: Gildardo Cranker, DO as PCP - General (Internal Medicine)  Extended Emergency Contact Information Primary Emergency Contact: Busch,Elaine Address: [address]          [place], [place] [address] Johnnette Litter of [place] Phone: [phone] Mobile Phone: [phone] Relation: Sister  Code Status: FULL CODE Goals of Care: Advanced Directive information Advanced Directives 03/23/2017  Does Patient Have a Medical Advance Directive? No  Would patient like information on creating a medical advance directive? No - Patient declined     Chief Complaint  Patient presents with  . Annual Exam    Yearly Exam    HPI: Patient is a 41 y.o. female seen in today for an annual wellness exam.   Last pap 11/2016. She has irregular menses with heavy clots that are worse in evening. Menses really light in AM and during the day. She had an endometrial bx 12/28/16 that was neg. She reports FHx sibling and mother with hysterectomy prior to age 9 for similar issue. She has 2 siblings who had hysterectomy 33s for same issue. Followed by GYN at [facility] [place]  Hx chronic Urticaria - no further outbreaks. She had complete w/u at allergist that was neg  COPD/asthma - on proventil HFA, albuterol nebs prn, symbicort and singulair. Last PFTs in 2015  OSA - pt denies. She never completed sleep study and was not formally dx  Morbid Obesity - gained 3 lbs since Nov 2017. She has tried to lose weight on her own by cutting calories, various diets and exercise without success. She plans to meet with personal trainer this weekend  Hx iron deficiency anemia - Hgb 11; MCV 80.1. Menses irregular. She still has menses monthly and they are regular. No heavy bleeding or clots. She craves ice  Leukocytosis - chronic. WBC  8.5K  Depression screen [facility] 2/9 03/23/2017 07/14/2016 09/12/2015 09/12/2015  Decreased Interest 0 0 0 0  Down, Depressed, Hopeless 0 0 0 0  PHQ - 2 Score 0 0 0 0    Fall Risk  03/23/2017 07/14/2016 09/12/2015 09/12/2015  Falls in the past year? No No No No   No flowsheet data found.   Health Maintenance  Topic Date Due  . HIV Screening  05/10/1992  . INFLUENZA VACCINE  04/27/2017  . TETANUS/TDAP  12/20/2018  . PAP SMEAR  12/28/2019    Past Medical History:  Diagnosis Date  . Anemia   . Asthma   . Bronchitis   . COPD (chronic obstructive pulmonary disease) ([place])   . Obesity     Past Surgical History:  Procedure Laterality Date  . DENTAL SURGERY     2 teeth pulled, 11/22/14  . NO PAST SURGERIES      Family History  Problem Relation Age of Onset  . Diabetes Mother   . Cancer Mother   . Diabetes Sister   . Hypertension Sister   . Hypertension Sister   . Urticaria Neg Hx   . Immunodeficiency Neg Hx   . Eczema Neg Hx   . Asthma Neg Hx   . Angioedema Neg Hx   . Allergic rhinitis Neg Hx    Family Status  Relation Status  . Mother Alive  . Father Alive  . Sister Alive  . Brother Alive  .  MGM Deceased  . MGF Deceased  . PGM Deceased  . PGF Deceased  . Sister Alive  . Sister Alive  . Neg Hx (Not Specified)    shoulder  Social History   Social History  . Marital status: Single    Spouse name: N/A  . Number of children: N/A  . Years of education: N/A   Occupational History  . customer service rep    Social History Main Topics  . Smoking status: Former Smoker    Packs/day: 0.25    Years: 15.00    Types: Cigarettes    Quit date: 09/16/2014  . Smokeless tobacco: Never Used  . Alcohol use No  . Drug use: No  . Sexual activity: Not Currently   Other Topics Concern  . Not on file   Social History Narrative   DIET: regular      DO YOU DRINK/EAT THINGS WITH CAFFEINE: Yes      MARITAL STATUS: Single      WHAT YEAR WERE YOU MARRIED:        DO YOU LIVE IN A HOUSE, APARTMENT, ASSISTED LIVING, CONDO TRAILER ETC.:  House       IS IT ONE OR MORE STORIES: 1      HOW MANY PERSONS LIVE IN YOUR HOME: 3      DO YOU HAVE PETS IN YOUR HOME: No      CURRENT OR PAST PROFESSION: customer Service Rep      DO YOU EXERCISE: very little      WHAT TYPE AND HOW OFTEN:    No Known Allergies  Allergies as of 03/23/2017   No Known Allergies     Medication List       Accurate as of 03/23/17 10:31 AM. Always use your most recent med list.          albuterol (2.5 MG/3ML) 0.083% nebulizer solution Commonly known as:  PROVENTIL Take 3 mLs (2.5 mg total) by nebulization every 6 (six) hours as needed for wheezing or shortness of breath.   albuterol 108 (90 Base) MCG/ACT inhaler Commonly known as:  PROVENTIL HFA;VENTOLIN HFA Inhale 1-2 puffs into the lungs every 6 (six) hours as needed for wheezing or shortness of breath.   Azelastine HCl 0.15 % Soln Place 2 sprays into both nostrils 2 (two) times daily.   fluticasone-salmeterol 115-21 MCG/ACT inhaler Commonly known as:  ADVAIR HFA INHALE 1 TO 2 PUFFS EVERY 12 HOURS   levocetirizine 5 MG tablet Commonly known as:  XYZAL Take 1 tablet (5 mg total) by mouth every evening.   montelukast 10 MG tablet Commonly known as:  SINGULAIR Take 1 tablet (10 mg total) by mouth at bedtime.        Review of Systems:  Review of Systems  Constitutional: Positive for fatigue.  Genitourinary: Positive for menstrual problem.  All other systems reviewed and are negative.   Physical Exam: Vitals:   03/23/17 1005  BP: 124/76  Pulse: 96  Temp: 98 F (36.7 C)  TempSrc: Oral  SpO2: 97%  Weight: (!) 428 lb 6.4 oz (194.3 kg)  Height: _0  (1.575 m)   Body mass index is 78.36 kg/m. Physical Exam  Constitutional: She is oriented to person, place, and time. She appears well-developed and well-nourished. No distress.  HENT:  Head: Normocephalic and atraumatic.  Right Ear: External ear  normal.  Left Ear: External ear normal.  Mouth/Throat: Oropharynx is clear and moist. No oropharyngeal exudate.  MMM; no oral thrush  Eyes:  EOM are normal. Pupils are equal, round, and reactive to light. No scleral icterus.  Neck: Normal range of motion. Neck supple. Carotid bruit is not present. No tracheal deviation present. No thyromegaly present.  Cardiovascular: Normal rate, regular rhythm and intact distal pulses.  Exam reveals no gallop and no friction rub.   No murmur heard. No LE edema b/l. No calf TTP  Pulmonary/Chest: Effort normal and breath sounds normal. No respiratory distress. She has no wheezes. She has no rales. She exhibits no tenderness. Right breast exhibits skin change (hidradenitis suppuritiva noted under breast). Right breast exhibits no inverted nipple, no mass, no nipple discharge and no tenderness. Left breast exhibits no inverted nipple, no mass, no nipple discharge, no skin change and no tenderness. Breasts are symmetrical.  No rhonchi  Abdominal: Soft. Bowel sounds are normal. She exhibits no distension and no mass. There is no hepatosplenomegaly or hepatomegaly. There is no tenderness. There is no rebound and no guarding. No hernia.  Morbidly obese  Musculoskeletal: She exhibits no deformity.  Lymphadenopathy:    She has no cervical adenopathy.  Neurological: She is alert and oriented to person, place, and time. She has normal reflexes.  Skin: Skin is warm and dry. Rash (R>L axillary suppuritiva hidradenitis; no secondary signs of infection; no redness or d/c) noted.  Psychiatric: She has a normal mood and affect. Her behavior is normal. Judgment and thought content normal.  Vitals reviewed.   Labs reviewed:  Basic Metabolic Panel:  Recent Labs  07/14/16 0000  NA 136  K 4.3  CL 101  CO2 29  GLUCOSE 92  BUN 8  CREATININE 0.71  CALCIUM 9.2  TSH 2.33   Liver Function Tests:  Recent Labs  07/14/16 0000  AST 11  ALT 7  ALKPHOS 101  BILITOT 0.6   PROT 7.8  ALBUMIN 3.6   No results for input(s): LIPASE, AMYLASE in the last 8760 hours. No results for input(s): AMMONIA in the last 8760 hours. CBC:  Recent Labs  07/14/16 0000  WBC 8.5  NEUTROABS 5,270  HGB 11.0*  HCT 35.8  MCV 80.1  PLT 285   Lipid Panel:  Recent Labs  07/14/16 0000  CHOL 134  HDL 82  LDLCALC 38  TRIG 70  CHOLHDL 1.6   Lab Results  Component Value Date   HGBA1C 5.0 07/14/2016    Procedures: No results found. ECG OBTAINED AND REVIEWED BY MYSELF: NSR @ 96 BPM, nml axis, poor R wave progression. No acute ischemic changes. No other ECG available to compare  Assessment/Plan   ICD-10-CM   1. Well adult exam Z00.00 Lipid Panel  2. Morbid obesity (HCC) E66.01 phentermine 30 MG capsule    CMP with eGFR  3. Iron deficiency anemia due to chronic blood loss D50.0 CBC with Differential/Platelets    Ferritin    Iron and TIBC    TSH  4. Chronic obstructive pulmonary disease, unspecified COPD type ([place]) J44.9   5. Moderate persistent asthma without complication F57.32   6. Leukocytosis, unspecified type D72.829   7. Irregular menses N92.6   8. Hidradenitis suppurativa L73.2    R>L axillary   START PHENTERMINE for weight loss.  Join local gym as discussed  Reduce calories to 1200 calories per day  Continue other medications as ordered  Follow up with specialists as as scheduled  Follow up in 1 month for obesity  Keeping You Healthy and Phentermine handout given  Cordella Register. Eulas Post, D. O., F. [place]  [facility] [address] [place], [place] [address] [phone] Cell (Monday-Friday 8 AM - 5 PM) [phone] After 5 PM and follow prompts

## 2017-03-23 NOTE — Patient Instructions (Addendum)
START PHENTERMINE for weight loss.  Join local gym as discussed  Reduce calories to 1200 calories per day  Continue other medications as ordered  Follow up with specialists as as scheduled  Follow up in 1 month for obesity  Keeping You Healthy  Get These Tests 1. Blood Pressure- Have your blood pressure checked once a year by your health care provider.  Normal blood pressure is 120/80. 2. Weight- Have your body mass index (BMI) calculated to screen for obesity.  BMI is measure of body fat based on height and weight.  You can also calculate your own BMI at GravelBags.it. 3. Cholesterol- Have your cholesterol checked every 5 years starting at age 40 then yearly starting at age 14. 38. Chlamydia, HIV, and other sexually transmitted diseases- Get screened every year until age 40, then within three months of each new sexual provider. 5. Pap Test - Every 1-5 years; discuss with your health care provider. 6. Mammogram- Every 1-2 years starting at age 11--40  Take these medicines  Calcium with Vitamin D-Your body needs 1200 mg of Calcium each day and [id number] IU of Vitamin D daily.  Your body can only absorb 500 mg of Calcium at a time so Calcium must be taken in 2 or 3 divided doses throughout the day.  Multivitamin with folic acid- Once daily if it is possible for you to become pregnant.  Get these Immunizations  Gardasil-Series of three doses; prevents HPV related illness such as genital warts and cervical cancer.  Menactra-Single dose; prevents meningitis.  Tetanus shot- Every 10 years.  Flu shot-Every year.  Take these steps 1. Do not smoke-Your healthcare provider can help you quit.  For tips on how to quit go to www.smokefree.gov or call 1-800 QUITNOW. 2. Be physically active- Exercise 5 days a week for at least 30 minutes.  If you are not already physically active, start slow and gradually work up to 30 minutes of moderate physical activity.  Examples of moderate  activity include walking briskly, dancing, swimming, bicycling, etc. 3. Breast Cancer- A self breast exam every month is important for early detection of breast cancer.  For more information and instruction on self breast exams, ask your healthcare provider or https://www.patel.info/. 4. Eat a healthy diet- Eat a variety of healthy foods such as fruits, vegetables, whole grains, low fat milk, low fat cheeses, yogurt, lean meats, poultry and fish, beans, nuts, tofu, etc.  For more information go to www. Thenutritionsource.org 5. Drink alcohol in moderation- Limit alcohol intake to one drink or less per day. Never drink and drive. 6. Depression- Your emotional health is as important as your physical health.  If you're feeling down or losing interest in things you normally enjoy please talk to your healthcare provider about being screened for depression. 7. Dental visit- Brush and floss your teeth twice daily; visit your dentist twice a year. 8. Eye doctor- Get an eye exam at least every 2 years. 9. Helmet use- Always wear a helmet when riding a bicycle, motorcycle, rollerblading or skateboarding. 85. Safe sex- If you may be exposed to sexually transmitted infections, use a condom. 11. Seat belts- Seat belts can save your live; always wear one. 12. Smoke/Carbon Monoxide detectors- These detectors need to be installed on the appropriate level of your home. Replace batteries at least once a year. 13. Skin cancer- When out in the sun please cover up and use sunscreen 15 SPF or higher. 14. Violence- If anyone is threatening or hurting you, please  tell your healthcare provider.   Phentermine tablets or capsules What is this medicine? PHENTERMINE (FEN ter meen) decreases your appetite. It is used with a reduced calorie diet and exercise to help you lose weight. This medicine may be used for other purposes; ask your health care provider or pharmacist if you have questions. COMMON BRAND  NAME(S): Adipex-P, Atti-Plex P, Atti-Plex P Spansule, Fastin, Lomaira, Pro-Fast, Tara-8 What should I tell my health care provider before I take this medicine? They need to know if you have any of these conditions: -agitation -glaucoma -heart disease -high blood pressure -history of substance abuse -lung disease called Primary Pulmonary Hypertension (PPH) -taken an MAOI like Carbex, Eldepryl, Marplan, Nardil, or Parnate in last 14 days -thyroid disease -an unusual or allergic reaction to phentermine, other medicines, foods, dyes, or preservatives -pregnant or trying to get pregnant -breast-feeding How should I use this medicine? Take this medicine by mouth with a glass of water. Follow the directions on the prescription label. The instructions for use may differ based on the product and dose you are taking. Avoid taking this medicine in the evening. It may interfere with sleep. Take your doses at regular intervals. Do not take your medicine more often than directed. Talk to your pediatrician regarding the use of this medicine in children. While this drug may be prescribed for children 17 years or older for selected conditions, precautions do apply. Overdosage: If you think you have taken too much of this medicine contact a poison control center or emergency room at once. NOTE: This medicine is only for you. Do not share this medicine with others. What if I miss a dose? If you miss a dose, take it as soon as you can. If it is almost time for your next dose, take only that dose. Do not take double or extra doses. What may interact with this medicine? Do not take this medicine with any of the following medications: -duloxetine -MAOIs like Carbex, Eldepryl, Marplan, Nardil, and Parnate -medicines for colds or breathing difficulties like pseudoephedrine or phenylephrine -procarbazine -sibutramine -SSRIs like citalopram, escitalopram, fluoxetine, fluvoxamine, paroxetine, and  sertraline -stimulants like dexmethylphenidate, methylphenidate or modafinil -venlafaxine This medicine may also interact with the following medications: -medicines for diabetes This list may not describe all possible interactions. Give your health care provider a list of all the medicines, herbs, non-prescription drugs, or dietary supplements you use. Also tell them if you smoke, drink alcohol, or use illegal drugs. Some items may interact with your medicine. What should I watch for while using this medicine? Notify your physician immediately if you become short of breath while doing your normal activities. Do not take this medicine within 6 hours of bedtime. It can keep you from getting to sleep. Avoid drinks that contain caffeine and try to stick to a regular bedtime every night. This medicine was intended to be used in addition to a healthy diet and exercise. The best results are achieved this way. This medicine is only indicated for short-term use. Eventually your weight loss may level out. At that point, the drug will only help you maintain your new weight. Do not increase or in any way change your dose without consulting your doctor. You may get drowsy or dizzy. Do not drive, use machinery, or do anything that needs mental alertness until you know how this medicine affects you. Do not stand or sit up quickly, especially if you are an older patient. This reduces the risk of dizzy or fainting spells. Alcohol  may increase dizziness and drowsiness. Avoid alcoholic drinks. What side effects may I notice from receiving this medicine? Side effects that you should report to your doctor or health care professional as soon as possible: -chest pain, palpitations -depression or severe changes in mood -increased blood pressure -irritability -nervousness or restlessness -severe dizziness -shortness of breath -problems urinating -unusual swelling of the legs -vomiting Side effects that usually do not  require medical attention (report to your doctor or health care professional if they continue or are bothersome): -blurred vision or other eye problems -changes in sexual ability or desire -constipation or diarrhea -difficulty sleeping -dry mouth or unpleasant taste -headache -nausea This list may not describe all possible side effects. Call your doctor for medical advice about side effects. You may report side effects to FDA at 1-800-FDA-1088. Where should I keep my medicine? Keep out of the reach of children. This medicine can be abused. Keep your medicine in a safe place to protect it from theft. Do not share this medicine with anyone. Selling or giving away this medicine is dangerous and against the law. This medicine may cause accidental overdose and death if taken by other adults, children, or pets. Mix any unused medicine with a substance like cat litter or coffee grounds. Then throw the medicine away in a sealed container like a sealed bag or a coffee can with a lid. Do not use the medicine after the expiration date. Store at room temperature between 20 and 25 degrees C (68 and 77 degrees F). Keep container tightly closed. NOTE: This sheet is a summary. It may not cover all possible information. If you have questions about this medicine, talk to your doctor, pharmacist, or health care provider.  2018 Elsevier/Gold Standard (2015-06-20 12:53:15)

## 2017-03-24 LAB — FERRITIN: Ferritin: 5 ng/mL — ABNORMAL LOW (ref 10–154)

## 2017-03-28 ENCOUNTER — Telehealth: Payer: Self-pay

## 2017-03-28 DIAGNOSIS — D509 Iron deficiency anemia, unspecified: Secondary | ICD-10-CM

## 2017-03-28 MED ORDER — FERROUS SULFATE 325 (65 FE) MG PO TBEC: 325.0000 mg | DELAYED_RELEASE_TABLET | Freq: Two times a day (BID) | ORAL | 6 refills | Status: DC

## 2017-03-28 NOTE — Telephone Encounter (Signed)
Prescription was sent for iron 325 mg tablets.   Referral for hematology has been pended. Please select proper locations and complete referral.

## 2017-03-28 NOTE — Telephone Encounter (Signed)
A prior authorization request was received for phentermine 30 mg tablet.  PA forms were started and placed in Dr. Vale Haven folder for completion.   Fax completed forms to [facility] at [phone].

## 2017-03-28 NOTE — Telephone Encounter (Signed)
-----   Message from [place], [place] sent at 03/24/2017  2:28 PM EDT ----- She has severe iron deficiency anemia - Hgb still above 7 and not in transfusion range at this time; please refer to hematology for possible iron infusion; start Ferrous sulfate 325mg  #60 take 1 tab po BID with 6RF; take stool softener daily; reduced nutritional status but possibly falsely low due to severe anemia; other labs stable; follow up as scheduled

## 2017-03-29 MED ORDER — FLUTICASONE-SALMETEROL 115-21 MCG/ACT IN AERO: INHALATION_SPRAY | RESPIRATORY_TRACT | 1 refills | Status: DC

## 2017-03-31 NOTE — Telephone Encounter (Signed)
A fax was received from [facility] stating that PA for phentermine has been DENIED. There was no appeal process, fax stated that another PA could be completed and clinical documentation of need for medication could be submitted.   New PA was done and clinical documentation was printed for submission.   Keyword: [id number] Pt ID #: [id number] BIN #: [id number]

## 2017-04-01 NOTE — Telephone Encounter (Signed)
Prior authorization for phentermine 30 mg tablets has been APPROVED.   Pharmacy and patient will be notified later today.

## 2017-04-01 NOTE — Telephone Encounter (Signed)
Approval letter received and faxed to pharmacy.

## 2017-04-05 ENCOUNTER — Other Ambulatory Visit: Payer: Self-pay

## 2017-04-05 MED ORDER — FLUTICASONE-SALMETEROL 115-21 MCG/ACT IN AERO: INHALATION_SPRAY | RESPIRATORY_TRACT | 1 refills | Status: DC

## 2017-04-05 NOTE — Telephone Encounter (Signed)
Patient called and stated that her insurance would not cover advair inhaler unless it was for a 90 day supply.   [facility] called [facility] to find out exactly how much needed to be ordered for a 90 day supply and I was told 180 grams. Rx was sent for the 180 g with 1 RF (90 day supply plus 1 RF)

## 2017-04-19 ENCOUNTER — Encounter: Payer: Self-pay | Admitting: Internal Medicine

## 2017-04-19 ENCOUNTER — Ambulatory Visit (INDEPENDENT_AMBULATORY_CARE_PROVIDER_SITE_OTHER): Payer: 59 | Admitting: Internal Medicine

## 2017-04-19 DIAGNOSIS — D500 Iron deficiency anemia secondary to blood loss (chronic): Secondary | ICD-10-CM

## 2017-04-19 NOTE — Progress Notes (Signed)
Patient ID: April Ellis, female   DOB: Jan 28, 1976, 41 y.o.   MRN: [id number]    Location:  [facility] Place of Service: OFFICE  Chief Complaint  Patient presents with  . Medical Management of Chronic Issues    1 month Routine Visit  . Health Maintenance    Per health maintenance patient is due for HIV testing    HPI:  41 yo female seen today for f/u morbid obesity. She was started on phentermine at last OV but med was approved by [facility] 2 weeks ago. Weight from 427 lb --> 419 lbs today. Current BMI 76.8. She is eating "different". She has increased exercise but never met with personal trainer. No palpitations, HA, dizziness. She does have dry mouth.  Morbid Obesity - lost 8 lbs since June 2018. In the past, she tried to lose weight on her own by cutting calories, various diets and exercise without success. Now taking phentermine and cut calories to 1200/day. She is making healthy food choices and meal preps.   Iron deficiency anemia - severe. Likely related to menorrhagia and mostly constant menses. Ferritin 5; iron <10; TIBC 341; %sat 1; Hgb 7.1; Hct 25.8. She is taking iron BID. Followed by GYN. Hematology referral pending. Dizziness improved and no SOB  Past Medical History:  Diagnosis Date  . Anemia   . Asthma   . Bronchitis   . COPD (chronic obstructive pulmonary disease) ([place])   . Obesity     Past Surgical History:  Procedure Laterality Date  . DENTAL SURGERY     2 teeth pulled, 11/22/14  . NO PAST SURGERIES      Patient Care Team: Gildardo Cranker, DO as PCP - General (Internal Medicine)  Social History   Social History  . Marital status: Single    Spouse name: N/A  . Number of children: N/A  . Years of education: N/A   Occupational History  . customer service rep    Social History Main Topics  . Smoking status: Former Smoker    Packs/day: 0.25    Years: 15.00    Types: Cigarettes    Quit date: 09/16/2014  . Smokeless tobacco: Never Used  . Alcohol  use No  . Drug use: No  . Sexual activity: Not Currently   Other Topics Concern  . Not on file   Social History Narrative   DIET: regular      DO YOU DRINK/EAT THINGS WITH CAFFEINE: Yes      MARITAL STATUS: Single      WHAT YEAR WERE YOU MARRIED:       DO YOU LIVE IN A HOUSE, APARTMENT, ASSISTED LIVING, CONDO TRAILER ETC.:  House       IS IT ONE OR MORE STORIES: 1      HOW MANY PERSONS LIVE IN YOUR HOME: 3      DO YOU HAVE PETS IN YOUR HOME: No      CURRENT OR PAST PROFESSION: customer Service Rep      DO YOU EXERCISE: very little      WHAT TYPE AND HOW OFTEN:     reports that she quit smoking about 2 years ago. Her smoking use included Cigarettes. She has a 3.75 pack-year smoking history. She has never used smokeless tobacco. She reports that she does not drink alcohol or use drugs.  Family History  Problem Relation Age of Onset  . Diabetes Mother   . Cancer Mother   . Diabetes Sister   . Hypertension  Sister   . Hypertension Sister   . Urticaria Neg Hx   . Immunodeficiency Neg Hx   . Eczema Neg Hx   . Asthma Neg Hx   . Angioedema Neg Hx   . Allergic rhinitis Neg Hx    Family Status  Relation Status  . Mother Alive  . Father Alive  . Sister Alive  . Brother Alive  . MGM Deceased  . MGF Deceased  . PGM Deceased  . PGF Deceased  . Sister Alive  . Sister Alive  . Neg Hx (Not Specified)     No Known Allergies  Medications: Patient's Medications  New Prescriptions   No medications on file  Previous Medications   ALBUTEROL (PROVENTIL HFA;VENTOLIN HFA) 108 (90 BASE) MCG/ACT INHALER    Inhale 1-2 puffs into the lungs every 6 (six) hours as needed for wheezing or shortness of breath.   ALBUTEROL (PROVENTIL) (2.5 MG/3ML) 0.083% NEBULIZER SOLUTION    Take 3 mLs (2.5 mg total) by nebulization every 6 (six) hours as needed for wheezing or shortness of breath.   AZELASTINE HCL 0.15 % SOLN    Place 2 sprays into both nostrils 2 (two) times daily.   FERROUS  SULFATE 325 (65 FE) MG EC TABLET    Take 1 tablet (325 mg total) by mouth 2 (two) times daily with a meal.   FLUTICASONE-SALMETEROL (ADVAIR HFA) 115-21 MCG/ACT INHALER    INHALE 1 TO 2 PUFFS EVERY 12 HOURS   LEVOCETIRIZINE (XYZAL) 5 MG TABLET    Take 1 tablet (5 mg total) by mouth every evening.   MONTELUKAST (SINGULAIR) 10 MG TABLET    Take 1 tablet (10 mg total) by mouth at bedtime.   PHENTERMINE 30 MG CAPSULE    Take 1 capsule (30 mg total) by mouth every morning.  Modified Medications   No medications on file  Discontinued Medications   No medications on file    Review of Systems  Constitutional: Positive for fatigue.  Musculoskeletal: Positive for gait problem.  Neurological: Positive for dizziness.  All other systems reviewed and are negative.   Vitals:   04/19/17 0821  BP: 124/74  Pulse: 94  Temp: 98.4 F (36.9 C)  TempSrc: Oral  SpO2: 97%  Weight: (!) 419 lb (190.1 kg)  Height: _0  (1.575 m)   Body mass index is 76.64 kg/m.  Physical Exam  Constitutional: She is oriented to person, place, and time. She appears well-developed and well-nourished.  Cardiovascular: Normal rate, regular rhythm and intact distal pulses.  Exam reveals no gallop and no friction rub.   Murmur heard.  Systolic murmur is present with a grade of 1/6  Trace LE edema. No calf TTP  Pulmonary/Chest: Effort normal and breath sounds normal. No respiratory distress. She has no wheezes. She has no rales.  No rhonchi  Neurological: She is alert and oriented to person, place, and time.  Skin: Skin is warm. No rash noted.  Psychiatric: She has a normal mood and affect. Her behavior is normal. Judgment and thought content normal.     Labs reviewed: Office Visit on 03/23/2017  Component Date Value Ref Range Status  . HM Pap smear 12/27/2016 normal   Final  . WBC 03/23/2017 9.7  3.8 - 10.8 K/uL Final  . RBC 03/23/2017 3.62* 3.80 - 5.10 MIL/uL Final  . Hemoglobin 03/23/2017 7.1* 11.7 - 15.5 g/dL  Final  . HCT 03/23/2017 25.8* 35.0 - 45.0 % Final  . MCV 03/23/2017 71.3* 80.0 - 100.0  fL Final  . MCH 03/23/2017 19.6* 27.0 - 33.0 pg Final  . MCHC 03/23/2017 27.5* 32.0 - 36.0 g/dL Final  . RDW 03/23/2017 19.1* 11.0 - 15.0 % Final  . Platelets 03/23/2017 358  140 - 400 K/uL Final  . MPV 03/23/2017 8.3  7.5 - 12.5 fL Final  . Neutro Abs 03/23/2017 6305  1,500 - 7,800 cells/uL Final  . Lymphs Abs 03/23/2017 2328  850 - 3,900 cells/uL Final  . Monocytes Absolute 03/23/2017 679  200 - 950 cells/uL Final  . Eosinophils Absolute 03/23/2017 388  15 - 500 cells/uL Final  . Basophils Absolute 03/23/2017 0  0 - 200 cells/uL Final  . Neutrophils Relative % 03/23/2017 65  % Final  . Lymphocytes Relative 03/23/2017 24  % Final  . Monocytes Relative 03/23/2017 7  % Final  . Eosinophils Relative 03/23/2017 4  % Final  . Basophils Relative 03/23/2017 0  % Final  . Smear Review 03/23/2017 SEE NOTE   Final   Review of peripheral smear confirms automated results.  . Sodium 03/23/2017 137  135 - 146 mmol/L Final  . Potassium 03/23/2017 4.5  3.5 - 5.3 mmol/L Final  . Chloride 03/23/2017 104  98 - 110 mmol/L Final  . CO2 03/23/2017 26  20 - 31 mmol/L Final  . Glucose, Bld 03/23/2017 120* 65 - 99 mg/dL Final  . BUN 03/23/2017 7  7 - 25 mg/dL Final  . Creat 03/23/2017 0.71  0.50 - 1.10 mg/dL Final  . Total Bilirubin 03/23/2017 0.5  0.2 - 1.2 mg/dL Final  . Alkaline Phosphatase 03/23/2017 88  33 - 115 U/L Final  . AST 03/23/2017 10  10 - 30 U/L Final  . ALT 03/23/2017 7  6 - 29 U/L Final  . Total Protein 03/23/2017 7.4  6.1 - 8.1 g/dL Final  . Albumin 03/23/2017 3.3* 3.6 - 5.1 g/dL Final  . Calcium 03/23/2017 8.4* 8.6 - 10.2 mg/dL Final  . GFR, Est African American 03/23/2017 >89  >=60 mL/min Final  . GFR, Est Non African American 03/23/2017 >89  >=60 mL/min Final  . Cholesterol 03/23/2017 104  <200 mg/dL Final  . Triglycerides 03/23/2017 58  <150 mg/dL Final  . HDL 03/23/2017 63  >50 mg/dL Final  .  Total CHOL/HDL Ratio 03/23/2017 1.7  <5.0 Ratio Final  . VLDL 03/23/2017 12  <30 mg/dL Final  . LDL Cholesterol 03/23/2017 29  <100 mg/dL Final  . Ferritin 03/23/2017 5* 10 - 154 ng/mL Final  . Iron 03/23/2017 <10* 40 - 190 ug/dL Final   Result repeated and verified.  Marland Kitchen UIBC 03/23/2017 336  ug/dL Final  . TIBC 03/23/2017 341  250 - 450 ug/dL Final  . %SAT 03/23/2017 1* 11 - 50 % Final  . TSH 03/23/2017 1.93  mIU/L Final   Comment:   Reference Range   > or = 20 Years  0.40-4.50   Pregnancy Range First trimester  0.26-2.66 Second trimester 0.55-2.73 Third trimester  0.43-2.91       No results found.   Assessment/Plan   ICD-10-CM   1. Morbid obesity ([place]) E66.01    improving  2. Iron deficiency anemia due to chronic blood loss D50.0    severe; menorrhagia  Continue current medications as ordered  Continue reduced calories and increase exercise as tolerated  Continue meditation techniques  Will call with hematology referral  Follow up in 3 mos for anemia and obesity    Marly Schuld S. Eulas Post, D. O., F. [place]  [facility]  [facility] [place], [place] [address] [phone] Cell (Monday-Friday 8 AM - 5 PM) [phone] After 5 PM and follow prompts

## 2017-04-19 NOTE — Patient Instructions (Signed)
Continue current medications as ordered  Continue reduced calories and increase exercise as tolerated  Continue meditation techniques  Will call with hematology referral  Follow up in 3 mos for anemia and obesity

## 2017-04-22 ENCOUNTER — Telehealth: Payer: Self-pay | Admitting: Oncology

## 2017-04-22 ENCOUNTER — Encounter: Payer: Self-pay | Admitting: Oncology

## 2017-04-22 NOTE — Telephone Encounter (Signed)
Pt returned my call to schedule a hem appt. Appt has been scheduled for the pt to see Dr. Alen Blew on 8/16 at 11am. Pt aware to arrive 30 minutes early. Letter mailed to the pt.

## 2017-05-12 ENCOUNTER — Telehealth: Payer: Self-pay | Admitting: Oncology

## 2017-05-12 ENCOUNTER — Ambulatory Visit (HOSPITAL_BASED_OUTPATIENT_CLINIC_OR_DEPARTMENT_OTHER): Payer: 59 | Admitting: Oncology

## 2017-05-12 VITALS — BP 132/91 | HR 102 | Temp 98.4°F | Resp 18 | Ht 62.0 in | Wt >= 6400 oz

## 2017-05-12 DIAGNOSIS — D500 Iron deficiency anemia secondary to blood loss (chronic): Secondary | ICD-10-CM

## 2017-05-12 NOTE — Progress Notes (Signed)
Reason for Referral: Iron deficiency anemia.   HPI: 41 year old woman currently of [facility] referred for evaluation of anemia. She has a past medical history significant for asthma and recently diagnosed with menorrhagia. She was told that she had had are deficiency on an off for the majority of her adult life. She has noticed for the last few months heavy menstrual cycles with persistent clots associated with her menstrual cycle. She started developing fatigue, tiredness and occasional dyspnea. She developed dizziness and ice cravings. Laboratory data on 03/23/2017 showed a hemoglobin of 7.1 and MCV of 71. Iron was less than 10 with ferritin of 5. She started on iron sulfate and she has been taking it twice a day. Her symptoms improved dramatically at this time. She is no longer reporting any dizziness or fatigue. She was also evaluated by OB/GYN and was started on oral contraceptives and that have eliminated her heavy bleeding. She is currently feeling well and attends to activities of daily living. She continues to work full time without inability to do so. She denies any hematochezia or melena. She denied any hemoptysis or hematemesis.  She does not report any headaches, blurry vision, syncope or seizures. She does not report any fevers or chills or sweats. She is not report any cough, wheezing or hemoptysis. She does not report any chest pain or palpitation orthopnea. She does not report any cough, wheezing or dyspnea on exertion. She denied any change diarrhea or hematochezia. She does report mild constipation associated with her oral iron supplements. She denied any skeletal complaints. Remaining review of systems unremarkable.   Past Medical History:  Diagnosis Date  . Anemia   . Asthma   . Bronchitis   . COPD (chronic obstructive pulmonary disease) ([place])   . Obesity   :  Past Surgical History:  Procedure Laterality Date  . DENTAL SURGERY     2 teeth pulled, 11/22/14  . NO PAST  SURGERIES    :   Current Outpatient Prescriptions:  .  albuterol (PROVENTIL HFA;VENTOLIN HFA) 108 (90 Base) MCG/ACT inhaler, Inhale 1-2 puffs into the lungs every 6 (six) hours as needed for wheezing or shortness of breath., Disp: 1 Inhaler, Rfl: 0 .  albuterol (PROVENTIL) (2.5 MG/3ML) 0.083% nebulizer solution, Take 3 mLs (2.5 mg total) by nebulization every 6 (six) hours as needed for wheezing or shortness of breath., Disp: 75 mL, Rfl: 1 .  Azelastine HCl 0.15 % SOLN, Place 2 sprays into both nostrils 2 (two) times daily., Disp: 90 mL, Rfl: 1 .  ferrous sulfate 325 (65 FE) MG EC tablet, Take 1 tablet (325 mg total) by mouth 2 (two) times daily with a meal., Disp: 60 tablet, Rfl: 6 .  fluticasone-salmeterol (ADVAIR HFA) 115-21 MCG/ACT inhaler, INHALE 1 TO 2 PUFFS EVERY 12 HOURS, Disp: 180 g, Rfl: 1 .  levocetirizine (XYZAL) 5 MG tablet, Take 1 tablet (5 mg total) by mouth every evening., Disp: 90 tablet, Rfl: 0 .  montelukast (SINGULAIR) 10 MG tablet, Take 1 tablet (10 mg total) by mouth at bedtime., Disp: 90 tablet, Rfl: 1 .  phentermine 30 MG capsule, Take 1 capsule (30 mg total) by mouth every morning., Disp: 30 capsule, Rfl: 3:  No Known Allergies:  Family History  Problem Relation Age of Onset  . Diabetes Mother   . Cancer Mother   . Diabetes Sister   . Hypertension Sister   . Hypertension Sister   . Urticaria Neg Hx   . Immunodeficiency Neg Hx   .  Eczema Neg Hx   . Asthma Neg Hx   . Angioedema Neg Hx   . Allergic rhinitis Neg Hx   :  Social History   Social History  . Marital status: Single    Spouse name: N/A  . Number of children: N/A  . Years of education: N/A   Occupational History  . customer service rep    Social History Main Topics  . Smoking status: Former Smoker    Packs/day: 0.25    Years: 15.00    Types: Cigarettes    Quit date: 09/16/2014  . Smokeless tobacco: Never Used  . Alcohol use No  . Drug use: No  . Sexual activity: Not Currently   Other  Topics Concern  . Not on file   Social History Narrative   DIET: regular      DO YOU DRINK/EAT THINGS WITH CAFFEINE: Yes      MARITAL STATUS: Single      WHAT YEAR WERE YOU MARRIED:       DO YOU LIVE IN A HOUSE, APARTMENT, ASSISTED LIVING, CONDO TRAILER ETC.:  House       IS IT ONE OR MORE STORIES: 1      HOW MANY PERSONS LIVE IN YOUR HOME: 3      DO YOU HAVE PETS IN YOUR HOME: No      CURRENT OR PAST PROFESSION: customer Service Rep      DO YOU EXERCISE: very little      WHAT TYPE AND HOW OFTEN:  :  Pertinent items are noted in HPI.  Exam: Blood pressure (!) 132/91, pulse (!) 102, temperature 98.4 F (36.9 C), temperature source Oral, resp. rate 18, height 5\' 2"  (1.575 m), weight (!) 412 lb 1.6 oz (186.9 kg), SpO2 100 %. General appearance: alert and cooperative Throat: lips, mucosa, and tongue normal; teeth and gums normal Neck: no adenopathy Back: negative Resp: clear to auscultation bilaterally Cardio: regular rate and rhythm, S1, S2 normal, no murmur, click, rub or gallop GI: soft, non-tender; bowel sounds normal; no masses,  no organomegaly Extremities: extremities normal, atraumatic, no cyanosis or edema Skin: Skin color, texture, turgor normal. No rashes or lesions  CBC    Component Value Date/Time   WBC 9.7 03/23/2017 1110   RBC 3.62 (L) 03/23/2017 1110   HGB 7.1 (L) 03/23/2017 1110   HCT 25.8 (L) 03/23/2017 1110   PLT 358 03/23/2017 1110   MCV 71.3 (L) 03/23/2017 1110   MCV 75.5 (A) 11/18/2014 2014   MCH 19.6 (L) 03/23/2017 1110   MCHC 27.5 (L) 03/23/2017 1110   RDW 19.1 (H) 03/23/2017 1110   LYMPHSABS 2,328 03/23/2017 1110   MONOABS 679 03/23/2017 1110   EOSABS 388 03/23/2017 1110   BASOSABS 0 03/23/2017 1110      Chemistry      Component Value Date/Time   NA 137 03/23/2017 1110   K 4.5 03/23/2017 1110   CL 104 03/23/2017 1110   CO2 26 03/23/2017 1110   BUN 7 03/23/2017 1110   CREATININE 0.71 03/23/2017 1110      Component Value  Date/Time   CALCIUM 8.4 (L) 03/23/2017 1110   ALKPHOS 88 03/23/2017 1110   AST 10 03/23/2017 1110   ALT 7 03/23/2017 1110   BILITOT 0.5 03/23/2017 1110     Results for DALESHA, STANBACK (MRN [id number]) as of 05/12/2017 11:23  Ref. Range 03/23/2017 11:10  Iron Latest Ref Range: 40 - 190 ug/dL <10 (L)  UIBC Latest Units: ug/dL  336  TIBC Latest Ref Range: 250 - 450 ug/dL 341  %SAT Latest Ref Range: 11 - 50 % 1 (L)  Ferritin Latest Ref Range: 10 - 154 ng/mL 5 (L)    Assessment and Plan:    41 year old woman with the following issues:  1. Iron deficiency anemia related due to have a blood loss through her menstrual cycles: This was diagnosed in June 2018 after presenting with symptoms of fatigue and dyspnea. She is currently on oral iron iron sulfate at 325 mg twice a day with improvement in her symptoms. She denied any other blood losses or GI symptoms. She does report some mild constipation associated with oral iron but otherwise manageable.  Radiology of her iron deficiency appears to be clear without any evidence to suggest GI losses and does not require a GI workup at this time. She is currently on oral iron which is adequate with resolution of her symptoms. Risks and benefits of using intravenous iron was discussed today. Comp percussion associated with Feraheme were reviewed and include arthralgia, myalgias, infusion related complications among others.  After discussion today, she elected to continue with oral iron and we will check her iron stores and few months and determine whether she will require intravenous iron at that time. She prefers to continue on oral iron given her reasonable tolerance and response.  2. Menorrhagia: Appears to have improved at this time. She continues to follow with OB/GYN regarding this issue.  3. Follow-up: Will be in 2 months repeat iron studies.

## 2017-05-12 NOTE — Telephone Encounter (Signed)
Scheduled appt per 8/16 los - Gave patient AVS and calender per los.  

## 2017-06-20 ENCOUNTER — Other Ambulatory Visit: Payer: Self-pay | Admitting: Internal Medicine

## 2017-06-20 DIAGNOSIS — J4540 Moderate persistent asthma, uncomplicated: Secondary | ICD-10-CM

## 2017-06-22 ENCOUNTER — Ambulatory Visit (HOSPITAL_COMMUNITY): Admission: RE | Admit: 2017-06-22 | Payer: Self-pay | Source: Ambulatory Visit | Admitting: Obstetrics & Gynecology

## 2017-06-22 ENCOUNTER — Encounter (HOSPITAL_COMMUNITY): Admission: RE | Payer: Self-pay | Source: Ambulatory Visit

## 2017-06-22 SURGERY — DILATATION & CURETTAGE/HYSTEROSCOPY WITH NOVASURE ABLATION
Anesthesia: Choice

## 2017-07-22 ENCOUNTER — Ambulatory Visit: Payer: 59 | Admitting: Internal Medicine

## 2017-08-02 ENCOUNTER — Other Ambulatory Visit: Payer: 59

## 2017-08-03 ENCOUNTER — Ambulatory Visit (INDEPENDENT_AMBULATORY_CARE_PROVIDER_SITE_OTHER): Payer: 59 | Admitting: Internal Medicine

## 2017-08-03 ENCOUNTER — Encounter: Payer: Self-pay | Admitting: Internal Medicine

## 2017-08-03 DIAGNOSIS — J4540 Moderate persistent asthma, uncomplicated: Secondary | ICD-10-CM

## 2017-08-03 DIAGNOSIS — Z1231 Encounter for screening mammogram for malignant neoplasm of breast: Secondary | ICD-10-CM

## 2017-08-03 DIAGNOSIS — D500 Iron deficiency anemia secondary to blood loss (chronic): Secondary | ICD-10-CM

## 2017-08-03 DIAGNOSIS — L509 Urticaria, unspecified: Secondary | ICD-10-CM

## 2017-08-03 DIAGNOSIS — Z1239 Encounter for other screening for malignant neoplasm of breast: Secondary | ICD-10-CM

## 2017-08-03 MED ORDER — ALBUTEROL SULFATE HFA 108 (90 BASE) MCG/ACT IN AERS: 1.0000 | INHALATION_SPRAY | Freq: Four times a day (QID) | RESPIRATORY_TRACT | 6 refills | Status: DC | PRN

## 2017-08-03 MED ORDER — LEVOCETIRIZINE DIHYDROCHLORIDE 5 MG PO TABS: 5.0000 mg | ORAL_TABLET | Freq: Every evening | ORAL | 0 refills | Status: DC

## 2017-08-03 MED ORDER — PHENTERMINE HCL 30 MG PO CAPS: 30.0000 mg | ORAL_CAPSULE | ORAL | 3 refills | Status: DC

## 2017-08-03 NOTE — Progress Notes (Signed)
Patient ID: April Ellis, female   DOB: 03-01-1976, 41 y.o.   MRN: [id number]    Location:  [facility] Place of Service: OFFICE  Chief Complaint  Patient presents with  . Medical Management of Chronic Issues    3 month follow-up on anemia and obesity   . Medication Refill    Refill proventil inhaler and Xyzal   . Health Maintenance    Mammogram orderded   . Immunizations    Refused flu and pneumonia     HPI:  41 yo female seen today for f/u. She is taking phentermine at last OV but med was approved by [facility] 2 weeks ago. Weight from 419 lb --> 402 lbs today. Current BMI 73.53. She has increased exercise but never met with personal trainer due to cost. No palpitations, HA, dizziness. No SOB  Morbid Obesity - lost 26 lbs since June 2018. In the past, she tried to lose weight on her own by cutting calories, various diets and exercise without success. Now taking phentermine and cut calories to 1200/day. She is making healthy food choices and meal preps. She has a regular exercise routine. BMI 73.53  Iron deficiency anemia - severe 2/2 menorrhagia and mostly constant menses. Ferritin 5; iron <10; TIBC 341; %sat 1; Hgb 7.1; Hct 25.8. She is taking iron BID. Followed by GYN Dr Ileene Rubens. She started OCPs which has helped with heavy menses. she saw Hematology Dr Alen Blew and IV iron held due to improvement in levels. No Dizziness or SOB.  Past Medical History:  Diagnosis Date  . Anemia   . Asthma   . Bronchitis   . COPD (chronic obstructive pulmonary disease) ([place])   . Obesity     Past Surgical History:  Procedure Laterality Date  . DENTAL SURGERY     2 teeth pulled, 11/22/14  . NO PAST SURGERIES      Patient Care Team: Gildardo Cranker, DO as PCP - General (Internal Medicine)  Social History   Socioeconomic History  . Marital status: Single    Spouse name: Not on file  . Number of children: Not on file  . Years of education: Not on file  . Highest education level: Not on file    Social Needs  . Financial resource strain: Not on file  . Food insecurity - worry: Not on file  . Food insecurity - inability: Not on file  . Transportation needs - medical: Not on file  . Transportation needs - non-medical: Not on file  Occupational History  . Occupation: customer service rep  Tobacco Use  . Smoking status: Former Smoker    Packs/day: 0.25    Years: 15.00    Pack years: 3.75    Types: Cigarettes    Last attempt to quit: 09/16/2014    Years since quitting: 2.8  . Smokeless tobacco: Never Used  Substance and Sexual Activity  . Alcohol use: No    Alcohol/week: 0.0 oz  . Drug use: No  . Sexual activity: Not Currently  Other Topics Concern  . Not on file  Social History Narrative   DIET: regular      DO YOU DRINK/EAT THINGS WITH CAFFEINE: Yes      MARITAL STATUS: Single      WHAT YEAR WERE YOU MARRIED:       DO YOU LIVE IN A HOUSE, APARTMENT, ASSISTED LIVING, CONDO TRAILER ETC.:  House       IS IT ONE OR MORE STORIES: 1  HOW MANY PERSONS LIVE IN YOUR HOME: 3      DO YOU HAVE PETS IN YOUR HOME: No      CURRENT OR PAST PROFESSION: customer Service Rep      DO YOU EXERCISE: very little      WHAT TYPE AND HOW OFTEN:     reports that she quit smoking about 2 years ago. Her smoking use included cigarettes. She has a 3.75 pack-year smoking history. she has never used smokeless tobacco. She reports that she does not drink alcohol or use drugs.  Family History  Problem Relation Age of Onset  . Diabetes Mother   . Cancer Mother   . Diabetes Sister   . Hypertension Sister   . Hypertension Sister   . Urticaria Neg Hx   . Immunodeficiency Neg Hx   . Eczema Neg Hx   . Asthma Neg Hx   . Angioedema Neg Hx   . Allergic rhinitis Neg Hx    Family Status  Relation Name Status  . Mother  Alive  . Father  Alive  . Sister  Alive  . Brother  Alive  . MGM  Deceased  . MGF  Deceased  . PGM  Deceased  . PGF  Deceased  . Sister  Alive  . Sister  Alive   . Neg Hx  (Not Specified)     No Known Allergies  Medications:   Medication List        Accurate as of 08/03/17  9:09 AM. Always use your most recent med list.          * albuterol (2.5 MG/3ML) 0.083% nebulizer solution Commonly known as:  PROVENTIL Take 3 mLs (2.5 mg total) by nebulization every 6 (six) hours as needed for wheezing or shortness of breath.   * albuterol 108 (90 Base) MCG/ACT inhaler Commonly known as:  PROVENTIL HFA;VENTOLIN HFA Inhale 1-2 puffs into the lungs every 6 (six) hours as needed for wheezing or shortness of breath.   Azelastine HCl 0.15 % Soln Place 2 sprays into both nostrils 2 (two) times daily.   ferrous sulfate 325 (65 FE) MG EC tablet Take 1 tablet (325 mg total) by mouth 2 (two) times daily with a meal.   fluticasone-salmeterol 115-21 MCG/ACT inhaler Commonly known as:  ADVAIR HFA INHALE 1 TO 2 PUFFS EVERY 12 HOURS   levocetirizine 5 MG tablet Commonly known as:  XYZAL Take 1 tablet (5 mg total) by mouth every evening.   montelukast 10 MG tablet Commonly known as:  SINGULAIR TAKE 1 TABLET (10 MG TOTAL) BY MOUTH AT BEDTIME.   phentermine 30 MG capsule Take 1 capsule (30 mg total) by mouth every morning.      * This list has 2 medication(s) that are the same as other medications prescribed for you. Read the directions carefully, and ask your doctor or other care provider to review them with you.          Review of Systems  Constitutional: Negative for fatigue and unexpected weight change.  All other systems reviewed and are negative.   Vitals:   08/03/17 0904  BP: 130/70  Pulse: 90  Resp: 12  Temp: 98.2 F (36.8 C)  TempSrc: Oral  SpO2: 99%  Weight: (!) 402 lb (182.3 kg)  Height: _0  (1.575 m)   Body mass index is 73.53 kg/m.  Physical Exam  Constitutional: She is oriented to person, place, and time. She appears well-developed and well-nourished.  HENT:  Mouth/Throat: Oropharynx  is clear and moist. No  oropharyngeal exudate.  MMM; no oral thrush  Eyes: Pupils are equal, round, and reactive to light. No scleral icterus.  Neck: Neck supple. Carotid bruit is not present. No tracheal deviation present. No thyromegaly present.  Cardiovascular: Normal rate, regular rhythm, normal heart sounds and intact distal pulses. Exam reveals no gallop and no friction rub.  No murmur heard. No LE edema b/l. no calf TTP.   Pulmonary/Chest: Effort normal and breath sounds normal. No stridor. No respiratory distress. She has no wheezes. She has no rales.  Reduced BS at base b/l  Abdominal: Soft. Normal appearance and bowel sounds are normal. She exhibits no distension and no mass. There is no hepatomegaly. There is no tenderness. There is no rigidity, no rebound and no guarding. No hernia.  Morbidly obese  Lymphadenopathy:    She has no cervical adenopathy.  Neurological: She is alert and oriented to person, place, and time.  Skin: Skin is warm and dry. No rash noted.  Psychiatric: She has a normal mood and affect. Her behavior is normal. Judgment and thought content normal.     Labs reviewed: No visits with results within 3 Month(s) from this visit.  Latest known visit with results is:  Office Visit on 03/23/2017  Component Date Value Ref Range Status  . HM Pap smear 12/27/2016 normal   Final  . WBC 03/23/2017 9.7  3.8 - 10.8 K/uL Final  . RBC 03/23/2017 3.62* 3.80 - 5.10 MIL/uL Final  . Hemoglobin 03/23/2017 7.1* 11.7 - 15.5 g/dL Final  . HCT 03/23/2017 25.8* 35.0 - 45.0 % Final  . MCV 03/23/2017 71.3* 80.0 - 100.0 fL Final  . MCH 03/23/2017 19.6* 27.0 - 33.0 pg Final  . MCHC 03/23/2017 27.5* 32.0 - 36.0 g/dL Final  . RDW 03/23/2017 19.1* 11.0 - 15.0 % Final  . Platelets 03/23/2017 358  140 - 400 K/uL Final  . MPV 03/23/2017 8.3  7.5 - 12.5 fL Final  . Neutro Abs 03/23/2017 6,305  1,500 - 7,800 cells/uL Final  . Lymphs Abs 03/23/2017 2,328  850 - 3,900 cells/uL Final  . Monocytes Absolute  03/23/2017 679  200 - 950 cells/uL Final  . Eosinophils Absolute 03/23/2017 388  15 - 500 cells/uL Final  . Basophils Absolute 03/23/2017 0  0 - 200 cells/uL Final  . Neutrophils Relative % 03/23/2017 65  % Final  . Lymphocytes Relative 03/23/2017 24  % Final  . Monocytes Relative 03/23/2017 7  % Final  . Eosinophils Relative 03/23/2017 4  % Final  . Basophils Relative 03/23/2017 0  % Final  . Smear Review 03/23/2017 SEE NOTE   Final   Review of peripheral smear confirms automated results.  . Sodium 03/23/2017 137  135 - 146 mmol/L Final  . Potassium 03/23/2017 4.5  3.5 - 5.3 mmol/L Final  . Chloride 03/23/2017 104  98 - 110 mmol/L Final  . CO2 03/23/2017 26  20 - 31 mmol/L Final  . Glucose, Bld 03/23/2017 120* 65 - 99 mg/dL Final  . BUN 03/23/2017 7  7 - 25 mg/dL Final  . Creat 03/23/2017 0.71  0.50 - 1.10 mg/dL Final  . Total Bilirubin 03/23/2017 0.5  0.2 - 1.2 mg/dL Final  . Alkaline Phosphatase 03/23/2017 88  33 - 115 U/L Final  . AST 03/23/2017 10  10 - 30 U/L Final  . ALT 03/23/2017 7  6 - 29 U/L Final  . Total Protein 03/23/2017 7.4  6.1 - 8.1 g/dL Final  .  Albumin 03/23/2017 3.3* 3.6 - 5.1 g/dL Final  . Calcium 03/23/2017 8.4* 8.6 - 10.2 mg/dL Final  . GFR, Est African American 03/23/2017 >89  >=60 mL/min Final  . GFR, Est Non African American 03/23/2017 >89  >=60 mL/min Final  . Cholesterol 03/23/2017 104  <200 mg/dL Final  . Triglycerides 03/23/2017 58  <150 mg/dL Final  . HDL 03/23/2017 63  >50 mg/dL Final  . Total CHOL/HDL Ratio 03/23/2017 1.7  <5.0 Ratio Final  . VLDL 03/23/2017 12  <30 mg/dL Final  . LDL Cholesterol 03/23/2017 29  <100 mg/dL Final  . Ferritin 03/23/2017 5* 10 - 154 ng/mL Final  . Iron 03/23/2017 <10* 40 - 190 ug/dL Final   Result repeated and verified.  Marland Kitchen UIBC 03/23/2017 336  ug/dL Final  . TIBC 03/23/2017 341  250 - 450 ug/dL Final  . %SAT 03/23/2017 1* 11 - 50 % Final  . TSH 03/23/2017 1.93  mIU/L Final   Comment:   Reference Range   > or =  20 Years  0.40-4.50   Pregnancy Range First trimester  0.26-2.66 Second trimester 0.55-2.73 Third trimester  0.43-2.91       No results found.   Assessment/Plan   ICD-10-CM   1. Morbid obesity (HCC) E66.01 phentermine 30 MG capsule  2. Moderate persistent asthma without complication I01.65 albuterol (PROVENTIL HFA;VENTOLIN HFA) 108 (90 Base) MCG/ACT inhaler  3. Iron deficiency anemia due to chronic blood loss D50.0   4. Screening for breast cancer Z12.31 MM Digital Screening  5. Urticaria L50.9 levocetirizine (XYZAL) 5 MG tablet   Continue current medications as ordered  Follow up with GYN and Hematology as scheduled  Continue diet and exercise program - GREAT JOB!  Follow up in 3 mos for obesity and iron deficiency anemia   Roshell Brigham S. Perlie Gold  [facility] [address] [place], [place] [address] [phone] Cell (Monday-Friday 8 AM - 5 PM) [phone] After 5 PM and follow prompts

## 2017-08-03 NOTE — Patient Instructions (Signed)
Continue current medications as ordered  Follow up with GYN and Hematology as scheduled  Continue diet and exercise program - GREAT JOB!  Follow up in 3 mos for obesity and iron deficiency anemia

## 2017-08-04 ENCOUNTER — Encounter: Payer: Self-pay | Admitting: Internal Medicine

## 2017-08-04 ENCOUNTER — Ambulatory Visit: Payer: 59 | Admitting: Oncology

## 2017-10-05 ENCOUNTER — Telehealth: Payer: Self-pay | Admitting: *Deleted

## 2017-10-05 NOTE — Telephone Encounter (Signed)
Received prior authorization for Phentermine. Initiated through [facility]. Went into determination, response in 48-72 hours.   TCC:[id number] UZ#:[id number] [place] Case [id number]

## 2017-10-05 NOTE — Telephone Encounter (Signed)
Received fax from [employer]. Phentermine HCL 30mg  is APPROVED 09/05/17-01/03/18

## 2017-11-02 ENCOUNTER — Ambulatory Visit: Payer: 59 | Admitting: Internal Medicine

## 2017-11-27 ENCOUNTER — Other Ambulatory Visit: Payer: Self-pay | Admitting: Internal Medicine

## 2017-11-27 DIAGNOSIS — L509 Urticaria, unspecified: Secondary | ICD-10-CM

## 2018-01-11 ENCOUNTER — Telehealth: Payer: Self-pay | Admitting: *Deleted

## 2018-01-11 NOTE — Telephone Encounter (Signed)
Received fax from [employer] for Prior Authorization for Phentermine. Prior Authorization initiated through [facility] [place] Case#:[id number]. Went into 24-48 hour determination.  [place] Lopes ID#: [id number]

## 2018-01-12 ENCOUNTER — Telehealth: Payer: Self-pay

## 2018-01-12 NOTE — Telephone Encounter (Signed)
Noted  

## 2018-01-12 NOTE — Telephone Encounter (Signed)
Incoming fax received from [place]  PA request for Phentermine 30 mg capsule was denied because:  You do not meet the requirements of your plan Your plan Phenetermine/phendimetrazine/didrex/diethylpropion criteria covers this drug when you have not received 3 months of therapy within the past year. Your request has been denied based on the information we received.  Patient may choose to purchase this medicine at her own expense  If you disagree with this decision, you make ask for an appeal. Please mail or fax your appeal to   [facility] [place] [id number] [place] [address] Fax [phone]  Incoming faxed placed in Dr.Carter's review and sign folder  Please advise

## 2018-03-04 ENCOUNTER — Other Ambulatory Visit: Payer: Self-pay | Admitting: Internal Medicine

## 2018-03-06 NOTE — Telephone Encounter (Signed)
A medication refill was received from pharmacy for phentermine 30 mg. Request was pended to provider for approval. Patient has not been seen since 07/2017 and has no pending appointments scheduled. Last prior authorization for this medication was denied due to patient not meeting plan requirements for receiving 3 months of therapy within the past year.

## 2018-05-17 ENCOUNTER — Encounter: Payer: Self-pay | Admitting: Internal Medicine

## 2018-05-18 ENCOUNTER — Other Ambulatory Visit: Payer: Self-pay | Admitting: Internal Medicine

## 2018-05-18 DIAGNOSIS — J4540 Moderate persistent asthma, uncomplicated: Secondary | ICD-10-CM

## 2018-05-30 ENCOUNTER — Other Ambulatory Visit: Payer: Self-pay | Admitting: Internal Medicine

## 2018-11-01 ENCOUNTER — Ambulatory Visit: Payer: 59 | Admitting: Nurse Practitioner

## 2018-11-01 ENCOUNTER — Ambulatory Visit: Payer: 59 | Admitting: Family

## 2018-11-07 ENCOUNTER — Encounter (HOSPITAL_COMMUNITY): Payer: Self-pay

## 2018-11-07 DIAGNOSIS — J4540 Moderate persistent asthma, uncomplicated: Secondary | ICD-10-CM | POA: Diagnosis not present

## 2018-11-07 DIAGNOSIS — Z87891 Personal history of nicotine dependence: Secondary | ICD-10-CM | POA: Insufficient documentation

## 2018-11-07 DIAGNOSIS — Z79899 Other long term (current) drug therapy: Secondary | ICD-10-CM | POA: Insufficient documentation

## 2018-11-07 DIAGNOSIS — R0902 Hypoxemia: Secondary | ICD-10-CM | POA: Diagnosis not present

## 2018-11-07 DIAGNOSIS — R062 Wheezing: Secondary | ICD-10-CM | POA: Diagnosis present

## 2018-11-07 DIAGNOSIS — J449 Chronic obstructive pulmonary disease, unspecified: Secondary | ICD-10-CM | POA: Diagnosis not present

## 2018-11-07 DIAGNOSIS — J45901 Unspecified asthma with (acute) exacerbation: Secondary | ICD-10-CM | POA: Diagnosis not present

## 2018-11-07 DIAGNOSIS — J4541 Moderate persistent asthma with (acute) exacerbation: Secondary | ICD-10-CM | POA: Diagnosis not present

## 2018-11-07 MED ORDER — ALBUTEROL SULFATE (2.5 MG/3ML) 0.083% IN NEBU
5.0000 mg | INHALATION_SOLUTION | Freq: Once | RESPIRATORY_TRACT | Status: AC
  Administered 2018-11-07: 5 mg via RESPIRATORY_TRACT
  Filled 2018-11-07: qty 6

## 2018-11-07 NOTE — ED Triage Notes (Signed)
Pt arrived with complaints of an asthma exacerbation, home medications did not help with symptoms.

## 2018-11-08 ENCOUNTER — Emergency Department (HOSPITAL_COMMUNITY)
Admission: EM | Admit: 2018-11-08 | Discharge: 2018-11-08 | Disposition: A | Discharge status: Home / Self Care | Payer: 59 | Attending: Emergency Medicine | Admitting: Emergency Medicine

## 2018-11-08 ENCOUNTER — Other Ambulatory Visit: Payer: Self-pay

## 2018-11-08 ENCOUNTER — Emergency Department (HOSPITAL_COMMUNITY): Payer: 59

## 2018-11-08 DIAGNOSIS — D649 Anemia, unspecified: Secondary | ICD-10-CM

## 2018-11-08 DIAGNOSIS — L509 Urticaria, unspecified: Secondary | ICD-10-CM | POA: Diagnosis present

## 2018-11-08 DIAGNOSIS — J4540 Moderate persistent asthma, uncomplicated: Secondary | ICD-10-CM | POA: Diagnosis present

## 2018-11-08 DIAGNOSIS — J45901 Unspecified asthma with (acute) exacerbation: Secondary | ICD-10-CM

## 2018-11-08 DIAGNOSIS — R0902 Hypoxemia: Secondary | ICD-10-CM

## 2018-11-08 DIAGNOSIS — Z6841 Body Mass Index (BMI) 40.0 and over, adult: Secondary | ICD-10-CM | POA: Diagnosis present

## 2018-11-08 LAB — COMPREHENSIVE METABOLIC PANEL
ALT: 23 U/L (ref 0–44)
AST: 20 U/L (ref 15–41)
Albumin: 3.7 g/dL (ref 3.5–5.0)
Alkaline Phosphatase: 88 U/L (ref 38–126)
Anion gap: 10 (ref 5–15)
BUN: 8 mg/dL (ref 6–20)
CO2: 24 mmol/L (ref 22–32)
Calcium: 8.2 mg/dL — ABNORMAL LOW (ref 8.9–10.3)
Chloride: 101 mmol/L (ref 98–111)
Creatinine, Ser: 0.7 mg/dL (ref 0.44–1.00)
GFR calc Af Amer: >60 mL/min (ref >60)
GFR calc non Af Amer: >60 mL/min (ref >60)
Glucose, Bld: 146 mg/dL — ABNORMAL HIGH (ref 70–99)
Potassium: 4.1 mmol/L (ref 3.5–5.1)
SODIUM: 135 mmol/L (ref 135–145)
Total Bilirubin: 0.6 mg/dL (ref 0.3–1.2)
Total Protein: 8.5 g/dL — ABNORMAL HIGH (ref 6.5–8.1)

## 2018-11-08 LAB — CBC WITH DIFFERENTIAL/PLATELET
ABS IMMATURE GRANULOCYTES: 0.16 10*3/uL — AB (ref 0.00–0.07)
Basophils Absolute: 0 10*3/uL (ref 0.0–0.1)
Basophils Relative: 0 %
Eosinophils Absolute: 0.1 10*3/uL (ref 0.0–0.5)
Eosinophils Relative: 1 %
HCT: 31.1 % — ABNORMAL LOW (ref 36.0–46.0)
Hemoglobin: 8.6 g/dL — ABNORMAL LOW (ref 12.0–15.0)
Immature Granulocytes: 1 %
Lymphocytes Relative: 7 %
Lymphs Abs: 0.8 10*3/uL (ref 0.7–4.0)
MCH: 21 pg — AB (ref 26.0–34.0)
MCHC: 27.7 g/dL — ABNORMAL LOW (ref 30.0–36.0)
MCV: 76 fL — ABNORMAL LOW (ref 80.0–100.0)
Monocytes Absolute: 0.2 10*3/uL (ref 0.1–1.0)
Monocytes Relative: 2 %
Neutro Abs: 10.5 10*3/uL — ABNORMAL HIGH (ref 1.7–7.7)
Neutrophils Relative %: 89 %
Platelets: 375 10*3/uL (ref 150–400)
RBC: 4.09 MIL/uL (ref 3.87–5.11)
RDW: 20.9 % — ABNORMAL HIGH (ref 11.5–15.5)
WBC: 11.8 10*3/uL — ABNORMAL HIGH (ref 4.0–10.5)
nRBC: 0 % (ref 0.0–0.2)

## 2018-11-08 MED ORDER — IPRATROPIUM BROMIDE 0.02 % IN SOLN
0.5000 mg | Freq: Once | RESPIRATORY_TRACT | Status: AC
  Administered 2018-11-08: 0.5 mg via RESPIRATORY_TRACT
  Filled 2018-11-08: qty 2.5

## 2018-11-08 MED ORDER — PREDNISONE 20 MG PO TABS: 40.0000 mg | ORAL_TABLET | Freq: Every day | ORAL | 0 refills | Status: DC

## 2018-11-08 MED ORDER — MAGNESIUM SULFATE 50 % IJ SOLN: 1.0000 g | Freq: Once | INTRAMUSCULAR | Status: DC

## 2018-11-08 MED ORDER — ALBUTEROL SULFATE (2.5 MG/3ML) 0.083% IN NEBU
5.0000 mg | INHALATION_SOLUTION | Freq: Once | RESPIRATORY_TRACT | Status: AC
  Administered 2018-11-08: 5 mg via RESPIRATORY_TRACT
  Filled 2018-11-08: qty 6

## 2018-11-08 MED ORDER — ALBUTEROL (5 MG/ML) CONTINUOUS INHALATION SOLN
10.0000 mg/h | INHALATION_SOLUTION | Freq: Once | RESPIRATORY_TRACT | Status: AC
  Administered 2018-11-08: 10 mg/h via RESPIRATORY_TRACT
  Filled 2018-11-08: qty 20

## 2018-11-08 MED ORDER — ALBUTEROL SULFATE (2.5 MG/3ML) 0.083% IN NEBU: 5.0000 mg | INHALATION_SOLUTION | RESPIRATORY_TRACT | 1 refills | Status: DC | PRN

## 2018-11-08 MED ORDER — MAGNESIUM SULFATE IN D5W 1-5 GM/100ML-% IV SOLN
1.0000 g | Freq: Once | INTRAVENOUS | Status: AC
  Administered 2018-11-08: 1 g via INTRAVENOUS
  Filled 2018-11-08: qty 100

## 2018-11-08 MED ORDER — ALBUTEROL SULFATE HFA 108 (90 BASE) MCG/ACT IN AERS: 1.0000 | INHALATION_SPRAY | Freq: Four times a day (QID) | RESPIRATORY_TRACT | 6 refills | Status: DC | PRN

## 2018-11-08 MED ORDER — PREDNISONE 20 MG PO TABS
60.0000 mg | ORAL_TABLET | Freq: Once | ORAL | Status: AC
  Administered 2018-11-08: 60 mg via ORAL
  Filled 2018-11-08: qty 3

## 2018-11-08 MED ORDER — FLUTICASONE-SALMETEROL 115-21 MCG/ACT IN AERO: INHALATION_SPRAY | RESPIRATORY_TRACT | 1 refills | Status: DC

## 2018-11-08 NOTE — ED Notes (Signed)
Pt maintained O2 saturation between 97%-92%, with small drop to 89% but then came back up to 97%-100%.

## 2018-11-08 NOTE — ED Provider Notes (Signed)
[facility] DEPT Provider Note   CSN: [id number] Arrival date & time: 11/07/18  2307     History   Chief Complaint Chief Complaint  Patient presents with  . Asthma    HPI April Ellis is a 43 y.o. female.  Patient with a history of asthma presents with increased wheezing x 2 days with chest tightness, dry cough. No fever, vomiting. She states her nebulizer machine is not working properly so she has had only her rescue inhaler, Singulair and her Advair.   The history is provided by the patient. No language interpreter was used.  Asthma  Associated symptoms include shortness of breath. Pertinent negatives include no chest pain.    Past Medical History:  Diagnosis Date  . Anemia   . Asthma   . Bronchitis   . COPD (chronic obstructive pulmonary disease) ([place])   . Obesity     Patient Active Problem List   Diagnosis Date Noted  . Allergic reactions 08/16/2016  . Chronic rhinitis 08/16/2016  . Leukocytosis 09/30/2014  . Urticaria   . Iron deficiency anemia 08/14/2014  . History of tobacco abuse 08/14/2014  . Moderate persistent asthma 08/13/2014  . Atypical chest pain 06/27/2014  . COPD (chronic obstructive pulmonary disease) ([place]) 05/01/2014  . Morbid obesity ([place]) 12/19/2008  . ELEVATED BLOOD PRESSURE WITHOUT DIAGNOSIS OF HYPERTENSION 12/19/2008    Past Surgical History:  Procedure Laterality Date  . DENTAL SURGERY     2 teeth pulled, 11/22/14  . NO PAST SURGERIES       OB History   No obstetric history on file.      Home Medications    Prior to Admission medications   Medication Sig Start Date End Date Taking? Authorizing Provider  ALAYCEN 1/35 tablet Take 1 tablet daily by mouth. mx by GYN Dr Ileene Rubens 07/15/17   [provider]  albuterol (PROVENTIL HFA;VENTOLIN HFA) 108 (90 Base) MCG/ACT inhaler Inhale 1-2 puffs every 6 (six) hours as needed into the lungs for wheezing or shortness of breath. 08/03/17   Gildardo Cranker, DO  albuterol (PROVENTIL) (2.5 MG/3ML) 0.083% nebulizer solution Take 3 mLs (2.5 mg total) by nebulization every 6 (six) hours as needed for wheezing or shortness of breath. 04/22/16   Waynetta Pean, PA-C  Azelastine HCl 0.15 % SOLN Place 2 sprays into both nostrils 2 (two) times daily. 10/25/16   Bobbitt, Sedalia Muta, MD  ferrous sulfate 325 (65 FE) MG EC tablet Take 1 tablet (325 mg total) by mouth 2 (two) times daily with a meal. 03/28/17   Gildardo Cranker, DO  fluticasone-salmeterol (ADVAIR HFA) [phone] MCG/ACT inhaler INHALE 1 TO 2 PUFFS EVERY 12 HOURS 05/30/18   Gildardo Cranker, DO  levocetirizine (XYZAL) 5 MG tablet TAKE 1 TABLET (5 MG TOTAL) EVERY EVENING BY MOUTH. 11/28/17   Eulas Post, Monica, DO  montelukast (SINGULAIR) 10 MG tablet TAKE 1 TABLET (10 MG TOTAL) BY MOUTH AT BEDTIME. 05/18/18   Gildardo Cranker, DO  phentermine 30 MG capsule Take 1 capsule (30 mg total) every morning by mouth. 08/03/17   Gildardo Cranker, DO    Family History Family History  Problem Relation Age of Onset  . Diabetes Mother   . Cancer Mother   . Diabetes Sister   . Hypertension Sister   . Hypertension Sister   . Urticaria Neg Hx   . Immunodeficiency Neg Hx   . Eczema Neg Hx   . Asthma Neg Hx   . Angioedema Neg Hx   .  Allergic rhinitis Neg Hx     Social History Social History   Tobacco Use  . Smoking status: Former Smoker    Packs/day: 0.25    Years: 15.00    Pack years: 3.75    Types: Cigarettes    Last attempt to quit: 09/16/2014    Years since quitting: 4.1  . Smokeless tobacco: Never Used  Substance Use Topics  . Alcohol use: No    Alcohol/week: 0.0 standard drinks  . Drug use: No     Allergies   Patient has no known allergies.   Review of Systems Review of Systems  Constitutional: Negative for chills and fever.  HENT: Positive for congestion.   Respiratory: Positive for cough, chest tightness, shortness of breath and wheezing.   Cardiovascular: Negative for chest pain.    Gastrointestinal: Negative for nausea and vomiting.  Musculoskeletal: Negative for myalgias.  Neurological: Negative for weakness and light-headedness.     Physical Exam Updated Vital Signs BP (!) 146/78 (BP Location: Left Arm)   Pulse (!) 105   Temp 98.5 F (36.9 C) (Oral)   Resp 18   Ht 5\' 2"  (1.575 m)   Wt (!) 158.8 kg   LMP 11/05/2018   SpO2 100%   BMI 64.02 kg/m   Physical Exam Constitutional:      General: She is not in acute distress.    Appearance: She is well-developed. She is obese. She is not ill-appearing.  HENT:     Head: Normocephalic.     Nose: Nose normal.     Mouth/Throat:     Mouth: Mucous membranes are moist.  Neck:     Musculoskeletal: Normal range of motion and neck supple.  Cardiovascular:     Rate and Rhythm: Regular rhythm. Tachycardia present.  Pulmonary:     Effort: Pulmonary effort is normal.     Breath sounds: Wheezing present.     Comments: Examined after single Albuterol neb treatment provided.  Abdominal:     General: Bowel sounds are normal.     Palpations: Abdomen is soft.     Tenderness: There is no abdominal tenderness. There is no guarding or rebound.  Musculoskeletal: Normal range of motion.  Skin:    General: Skin is warm and dry.     Findings: No rash.  Neurological:     Mental Status: She is alert and oriented to person, place, and time.      ED Treatments / Results  Labs (all labs ordered are listed, but only abnormal results are displayed) Labs Reviewed - No data to display  EKG None  Radiology No results found.  Procedures Procedures (including critical care time) CRITICAL CARE Performed by: Dewaine Oats   Total critical care time: 90 minutes  Critical care time was exclusive of separately billable procedures and treating other patients.  Critical care was necessary to treat or prevent imminent or life-threatening deterioration.  Critical care was time spent personally by me on the following  activities: development of treatment plan with patient and/or surrogate as well as nursing, discussions with consultants, evaluation of patient's response to treatment, examination of patient, obtaining history from patient or surrogate, ordering and performing treatments and interventions, ordering and review of laboratory studies, ordering and review of radiographic studies, pulse oximetry and re-evaluation of patient's condition.  Medications Ordered in ED Medications  albuterol (PROVENTIL) (2.5 MG/3ML) 0.083% nebulizer solution 5 mg (5 mg Nebulization Given 11/07/18 2350)     Initial Impression / Assessment and Plan /  ED Course  I have reviewed the triage vital signs and the nursing notes.  Pertinent labs & imaging results that were available during my care of the patient were reviewed by me and considered in my medical decision making (see chart for details).     Patient with a history of asthma, history of hospitalizations but no intubations, presents with what she feels are asthma symptoms. Not able to use nebulizer machine at home.   She continues to wheeze after 1st neb treatment. Will start duoneb, prednisone. She desaturates to 88% with any movement in the room. Will continue to obseve.   After 2nd treatment patient states she feels much better. She is 93-94% RA sat while sitting. She becomes hypoxic to 84% while ambulating. She denies feeling SOB. 3rd treatment ordered.   Labs, IV mag, CXR ordered anticipating admission. Patient 91% while sitting at rest, decreasing to 84% with standing and a few steps. Hour long nebulizer treatment ordered.   Plan to admit when labs resulted.  Final Clinical Impressions(s) / ED Diagnoses   Final diagnoses:  None   1. Asthma exacerbation  ED Discharge Orders    None       Charlann Lange, Hershal Coria 11/08/18 6546    Fatima Blank, MD 11/08/18 [phone]

## 2018-11-08 NOTE — ED Provider Notes (Addendum)
7:40 AM Handoff from [place] PA-C at shift change.   Patient with history of asthma, worsening wheezing and breathing symptoms despite home inhaler since 11/06/2018.  Patient denies any chest pain.  Her symptoms are worse with exertion.    Patient has had multiple nebulizer treatments overnight with subjective improvement however she is still hypoxic into the 80s with ambulation.  Patient denies history of PE or any chest pain or leg swelling.  She does note that she is currently on a tapered course of birth control medicine for heavy bleeding which she started on 11/01/18.  She is a smoker.  Clinical course is not consistent with PE.  She states that previous asthma flares, although she does not have them frequently, felt similar to current symptoms.   BP (!) 144/50   Pulse 92   Temp 98.5 F (36.9 C) (Oral)   Resp 20   Ht 5\' 2"  (1.575 m)   Wt (!) 158.8 kg   LMP 11/05/2018   SpO2 100%   BMI 64.02 kg/m    8:07 AM Spoke with Dr. Wyline Copas who will see patient. Requests influenza swab, this was ordered.   BP 120/82   Pulse 87   Temp 98.5 F (36.9 C) (Oral)   Resp 20   Ht 5\' 2"  (1.575 m)   Wt (!) 158.8 kg   LMP 11/05/2018   SpO2 100%   BMI 64.02 kg/m    11:19 AM Patient seen by hospitalist. She is doing better. No longer significantly hypoxic.  Patient wants to go home.  Cleared by hospitalist for discharge.  I am in agreement with plan.  Patient given steroid burst and refill of her breathing medications. Carlisle Cater, PA-C   11/08/18 2542    Fatima Blank, MD 11/08/18 0836    Carlisle Cater, PA-C 11/08/18 1120    Fatima Blank, MD 11/09/18 [phone]

## 2018-11-08 NOTE — Consult Note (Signed)
Medical Consultation   April Ellis  KWI:[id number]  DOB: 1975/11/02  DOA: 11/08/2018  PCP: Patient, No Pcp Per   Requesting physician: EDP  Reason for consultation:  Asthma exacerbation   History of Present Illness: 43 y.o. female with medical history significant of asthma and morbid obesity presents to the ED with complaints of worsening sob and wheezing. Symptoms minimally improved with home inhaler. Denies fevers or chills.  ED Course: In the ED, pt was given multiple neb tx with persistent hypoxemia and wheezing. Pt received Mg infusion and later given hour long neb tx with eventual improvement in symptoms. CXR performed, findings of hyperinflation. Per EDP, pt noted to have persistent wheezing and hypoxemia on ambulation, thus hospitalist service consulted for consideration for admission. On further questioning, pt noted to be sob on ambulation overnight, however now much improved.  Review of Systems:  Review of Systems  Constitutional: Negative for chills, fever and weight loss.  HENT: Negative for congestion, ear pain, sinus pain and tinnitus.   Eyes: Negative for double vision, photophobia and discharge.  Respiratory: Positive for cough and wheezing. Negative for hemoptysis, sputum production and shortness of breath.   Cardiovascular: Negative for palpitations, orthopnea and claudication.  Gastrointestinal: Negative for abdominal pain, constipation and nausea.  Genitourinary: Negative for frequency, hematuria and urgency.  Musculoskeletal: Negative for back pain, joint pain and neck pain.  Neurological: Negative for tremors, sensory change, speech change, seizures and loss of consciousness.  Psychiatric/Behavioral: Negative for hallucinations and memory loss. The patient is not nervous/anxious.    As per HPI otherwise 10 point review of systems negative.   Past Medical History: Past Medical History:  Diagnosis Date  . Anemia   . Asthma   . Bronchitis     . COPD (chronic obstructive pulmonary disease) ([place])   . Obesity     Past Surgical History: Past Surgical History:  Procedure Laterality Date  . DENTAL SURGERY     2 teeth pulled, 11/22/14  . NO PAST SURGERIES      Allergies:  No Known Allergies  Social History:  reports that she quit smoking about 4 years ago. Her smoking use included cigarettes. She has a 3.75 pack-year smoking history. She has never used smokeless tobacco. She reports that she does not drink alcohol or use drugs.  Family History: Family History  Problem Relation Age of Onset  . Diabetes Mother   . Cancer Mother   . Diabetes Sister   . Hypertension Sister   . Hypertension Sister   . Urticaria Neg Hx   . Immunodeficiency Neg Hx   . Eczema Neg Hx   . Asthma Neg Hx   . Angioedema Neg Hx   . Allergic rhinitis Neg Hx     Physical Exam: Vitals:   11/08/18 0802 11/08/18 1055 11/08/18 1106 11/08/18 1132  BP: 111/72  140/79 (!) 147/50  Pulse: 76 96 99 (!) 102  Resp: (!) 26 20 17  (!) 22  Temp:      TempSrc:      SpO2: 100% 97% 97% 98%  Weight:      Height:        Constitutional: Appearance,  Alert and awake, oriented x3, not in any acute distress. Eyes: PERLA, EOMI, irises appear normal, anicteric sclera,  ENMT: external ears and nose appear normal, normal hearing or hard of hearing            Lips  appears normal, oropharynx mucosa, tongue, posterior pharynx appear normal  Neck: neck appears normal, no masses, normal ROM, no thyromegaly, no JVD  CVS: S1-S2 clear, s1, s2 Respiratory:  clear to auscultation bilaterally, no wheezing, rales or rhonchi. Respiratory effort normal. No accessory muscle use.  Abdomen: soft nontender, nondistended, normal bowel sounds, no hepatosplenomegaly, no hernias  Musculoskeletal: : no cyanosis, clubbing or edema noted bilaterally                       Joint/bones/muscle exam, strength, contractures or atrophy Neuro: Cranial nerves II-XII intact, strength, sensation,  reflexes Psych: judgement and insight appear normal, stable mood and affect, mental status Skin: no rashes or lesions or ulcers, no induration or nodules   Data reviewed:  I have personally reviewed following labs and imaging studies Labs:  CBC: Recent Labs  Lab 11/08/18 0434  WBC 11.8*  NEUTROABS 10.5*  HGB 8.6*  HCT 31.1*  MCV 76.0*  PLT 741    Basic Metabolic Panel: Recent Labs  Lab 11/08/18 0434  NA 135  K 4.1  CL 101  CO2 24  GLUCOSE 146*  BUN 8  CREATININE 0.70  CALCIUM 8.2*   GFR Estimated Creatinine Clearance: 136.7 mL/min (by C-G formula based on SCr of 0.7 mg/dL). Liver Function Tests: Recent Labs  Lab 11/08/18 0434  AST 20  ALT 23  ALKPHOS 88  BILITOT 0.6  PROT 8.5*  ALBUMIN 3.7   No results for input(s): LIPASE, AMYLASE in the last 168 hours. No results for input(s): AMMONIA in the last 168 hours. Coagulation profile No results for input(s): INR, PROTIME in the last 168 hours.  Cardiac Enzymes: No results for input(s): CKTOTAL, CKMB, CKMBINDEX, TROPONINI in the last 168 hours. BNP: Invalid input(s): POCBNP CBG: No results for input(s): GLUCAP in the last 168 hours. D-Dimer No results for input(s): DDIMER in the last 72 hours. Hgb A1c No results for input(s): HGBA1C in the last 72 hours. Lipid Profile No results for input(s): CHOL, HDL, LDLCALC, TRIG, CHOLHDL, LDLDIRECT in the last 72 hours. Thyroid function studies No results for input(s): TSH, T4TOTAL, T3FREE, THYROIDAB in the last 72 hours.  Invalid input(s): FREET3 Anemia work up No results for input(s): VITAMINB12, FOLATE, FERRITIN, TIBC, IRON, RETICCTPCT in the last 72 hours. Urinalysis    Component Value Date/Time   COLORURINE YELLOW 04/09/2014 2007   APPEARANCEUR CLEAR 04/09/2014 2007   LABSPEC 1.015 04/09/2014 2007   PHURINE 7.0 04/09/2014 2007   GLUCOSEU NEGATIVE 04/09/2014 2007   GLUCOSEU NEGATIVE 12/19/2008 1343   HGBUR NEGATIVE 04/09/2014 2007   BILIRUBINUR NEGATIVE  04/09/2014 2007   KETONESUR NEGATIVE 04/09/2014 2007   PROTEINUR NEGATIVE 04/09/2014 2007   UROBILINOGEN 1.0 04/09/2014 2007   NITRITE NEGATIVE 04/09/2014 2007   LEUKOCYTESUR NEGATIVE 04/09/2014 2007    Microbiology No results found for this or any previous visit (from the past 240 hour(s)).  Inpatient Medications:   Scheduled Meds: Continuous Infusions:   Radiological Exams on Admission: Dg Chest 2 View  Result Date: 11/08/2018 CLINICAL DATA:  Asthma with shortness of breath EXAM: CHEST - 2 VIEW COMPARISON:  12/31/2015 FINDINGS: Borderline heart size. Central airway cuffing. There is no edema, consolidation, effusion, or pneumothorax. No significant osseous finding. Borderline hyperinflation on the lateral view. IMPRESSION: 1. Airway thickening and borderline cardiomegaly. 2. Possible hyperinflation. 3. Stable from 2017. Electronically Signed   By: Monte Fantasia M.D.   On: 11/08/2018 06:13    Impression/Recommendations Principal Problem:   Asthma, chronic,  unspecified asthma severity, with acute exacerbation Active Problems:   Morbid obesity ([facility])   Moderate persistent asthma   Urticaria   1. Asthma exacerbation 1. Hyperinflation on CXR per review 2. No wheezing on exam when seen. Pt witnessed ambulating to bathroom on room air without difficulty in ED 3. Pt was able to ambulate on room air without significant drop in O2 sat 4. Recommend prednisone taper on discharge  5. Medically stable for discharge from ED 2. Morbid obesity 1. Recommend diet/lifestyle modification  Thank you for this consultation  Time Spent: 57min  Marylu Lund M.D. [facility] 11/08/2018, 2:53 PM

## 2018-11-08 NOTE — Discharge Instructions (Signed)
Please read and follow all provided instructions.  Your diagnoses today include:  1. Severe asthma with exacerbation, unspecified whether persistent   2. Hypoxia   3. Anemia, unspecified type   4. Moderate persistent asthma without complication     Tests performed today include:  Blood counts and electrolytes  CHesdt x-ray  Vital signs. See below for your results today.   Medications prescribed:   Prednisone - steroid medicine   It is best to take this medication in the morning to prevent sleeping problems. If you are diabetic, monitor your blood sugar closely and stop taking Prednisone if blood sugar is over 300. Take with food to prevent stomach upset.    Albuterol inhaler - medication that opens up your airway  Take any prescribed medications only as directed.  Home care instructions:  Follow any educational materials contained in this packet.  Follow-up instructions: Please follow-up with your primary care provider in the next 3 days for further evaluation of your symptoms and management of your asthma.  Return instructions:   Please return to the Emergency Department if you experience worsening symptoms.  Please return with worsening wheezing, shortness of breath, or difficulty breathing.  Return with persistent fever above 101F.   Please return if you have any other emergent concerns.  Additional Information:  Your vital signs today were: BP 140/79 (BP Location: Left Arm)    Pulse 99    Temp 98.5 F (36.9 C) (Oral)    Resp 17    Ht 5\' 2"  (1.575 m)    Wt (!) 158.8 kg    LMP 11/05/2018    SpO2 97%    BMI 64.02 kg/m  If your blood pressure (BP) was elevated above 135/85 this visit, please have this repeated by your doctor within one month. --------------

## 2018-11-08 NOTE — ED Notes (Signed)
Bed: WTR6 Expected date:  Expected time:  Means of arrival:  Comments: 

## 2018-11-09 ENCOUNTER — Ambulatory Visit (INDEPENDENT_AMBULATORY_CARE_PROVIDER_SITE_OTHER): Payer: 59 | Admitting: Family

## 2018-11-09 ENCOUNTER — Encounter: Payer: Self-pay | Admitting: Family

## 2018-11-09 VITALS — BP 130/80 | HR 92 | Temp 98.3°F | Ht 62.0 in | Wt 351.0 lb

## 2018-11-09 DIAGNOSIS — J4541 Moderate persistent asthma with (acute) exacerbation: Secondary | ICD-10-CM

## 2018-11-09 DIAGNOSIS — D509 Iron deficiency anemia, unspecified: Secondary | ICD-10-CM

## 2018-11-09 DIAGNOSIS — Z0289 Encounter for other administrative examinations: Secondary | ICD-10-CM | POA: Diagnosis not present

## 2018-11-09 NOTE — Progress Notes (Signed)
Provider: Marlowe Sax FNP-C  Patient, No Pcp Per  Patient Care Team: Patient, No Pcp Per as PCP - General (General Practice)  Extended Emergency Contact Information Primary Emergency Contact: Wakeland,Elaine Address: [address]          [place], [place] [address] Johnnette Litter of [place] Phone: [phone] Mobile Phone: [phone] Relation: Sister  Goals of care: Advanced Directive information Advanced Directives 11/07/2018  Does Patient Have a Medical Advance Directive? No  Would patient like information on creating a medical advance directive? No - Patient declined     Chief Complaint  Patient presents with  . Acute Visit    Asthma issues, patient was at emergency room yesterday     HPI:  Pt is a 43 y.o. female seen today for an acute visit for follow ED visit.she was seen at [facility] ED 11/08/2018 for increased wheezing x 2 days with chest tightness and dry cough.she was was treated for Asthma exacerbation.Her oxygen saturation was 88% with ambulation after her first treatment with Duonebs therefore had a 2nd treatment with much improvement saturation went up to 93-94% at RA while sitting but down to 84 % with ambulation.she had 3 rd treatment.she had labs done WBC 11.8,Hgb 8.6.of note she is on ferrous sulfate supplement and Provera 10 mg tablet daily for heavy vaginal bleeding that follows up with Gynecology.Her chest X-ray was negative. Prednisone 20 mg tablet x 4 days.She states her breathing has improved.she needs a new Nebulizer machine.  Cigarette Smoking - Had Quit smoking since 2017 but recently started smoking about 1/2 pack per day.she states has not smoked in the past 72 hours.   FMLA - She needs her FMLA paperwork filled out this visit.she has above asthma exacerbation occasional up to 3 episodes per year requiring 2-3 days off her work.she has had issues with asthma since 2015.      Past Medical History:  Diagnosis Date  . Anemia    . Asthma   . Bronchitis   . COPD (chronic obstructive pulmonary disease) ([place])   . Obesity    Past Surgical History:  Procedure Laterality Date  . DENTAL SURGERY     2 teeth pulled, 11/22/14  . NO PAST SURGERIES      No Known Allergies  Outpatient Encounter Medications as of 11/09/2018  Medication Sig  . ALAYCEN 1/35 tablet Take 1 tablet daily by mouth. mx by GYN Dr Ileene Rubens  . albuterol (PROVENTIL HFA;VENTOLIN HFA) 108 (90 Base) MCG/ACT inhaler Inhale 1-2 puffs into the lungs every 6 (six) hours as needed for wheezing or shortness of breath.  Marland Kitchen albuterol (PROVENTIL) (2.5 MG/3ML) 0.083% nebulizer solution Take 6 mLs (5 mg total) by nebulization every 4 (four) hours as needed for wheezing or shortness of breath.  . ferrous sulfate 325 (65 FE) MG EC tablet Take 325 mg by mouth daily.  . fluticasone-salmeterol (ADVAIR HFA) 115-21 MCG/ACT inhaler INHALE 1 TO 2 PUFFS EVERY 12 HOURS  . medroxyPROGESTERone (PROVERA) 10 MG tablet Take 10 mg by mouth daily.  . montelukast (SINGULAIR) 10 MG tablet TAKE 1 TABLET (10 MG TOTAL) BY MOUTH AT BEDTIME.  . predniSONE (DELTASONE) 20 MG tablet Take 2 tablets (40 mg total) by mouth daily.  . [DISCONTINUED] ferrous sulfate 325 (65 FE) MG EC tablet Take 1 tablet (325 mg total) by mouth 2 (two) times daily with a meal. (Patient taking differently: Take 325 mg by mouth daily. )   No facility-administered encounter medications on file as of  11/09/2018.     Review of Systems  Constitutional: Negative for appetite change, chills, fatigue and fever.  HENT: Negative for congestion, rhinorrhea, sinus pressure, sinus pain, sneezing and sore throat.   Eyes: Negative for pain, discharge, redness and itching.  Respiratory: Positive for wheezing. Negative for chest tightness and shortness of breath.   Cardiovascular: Negative for chest pain, palpitations and leg swelling.  Gastrointestinal: Negative for abdominal distention, abdominal pain, constipation, diarrhea, nausea  and vomiting.  Skin: Negative for color change, pallor and rash.  Neurological: Negative for dizziness, light-headedness and headaches.    Immunization History  Administered Date(s) Administered  . Td 12/19/2008   Pertinent  Health Maintenance Due  Topic Date Due  . INFLUENZA VACCINE  04/27/2018  . PAP SMEAR-Modifier  12/28/2019   Fall Risk  08/03/2017 04/19/2017 03/23/2017 07/14/2016 09/12/2015  Falls in the past year? No No No No No    Vitals:   11/09/18 1314  Weight: (!) 351 lb (159.2 kg)  Height: 5\' 2"  (1.575 m)   Body mass index is 64.2 kg/m. Physical Exam Vitals signs reviewed.  Constitutional:      General: She is not in acute distress.    Appearance: She is morbidly obese. She is not ill-appearing.  HENT:     Head: Normocephalic.     Right Ear: Tympanic membrane, ear canal and external ear normal. There is no impacted cerumen.     Left Ear: Tympanic membrane, ear canal and external ear normal. There is no impacted cerumen.     Nose: Nose normal. No congestion or rhinorrhea.     Mouth/Throat:     Mouth: Mucous membranes are moist.     Pharynx: Oropharynx is clear. No oropharyngeal exudate or posterior oropharyngeal erythema.  Eyes:     General: No scleral icterus.       Right eye: No discharge.        Left eye: No discharge.     Extraocular Movements: Extraocular movements intact.     Conjunctiva/sclera: Conjunctivae normal.     Pupils: Pupils are equal, round, and reactive to light.     Comments: Corrective lens in place   Neck:     Musculoskeletal: Normal range of motion. No muscular tenderness.     Vascular: No carotid bruit.  Cardiovascular:     Rate and Rhythm: Normal rate and regular rhythm.     Pulses: Normal pulses.     Heart sounds: Normal heart sounds. No murmur. No friction rub. No gallop.   Pulmonary:     Effort: Pulmonary effort is normal. No respiratory distress.     Breath sounds: No rhonchi or rales.     Comments: Diminished wheezes  Chest:      Chest wall: No tenderness.  Abdominal:     General: Bowel sounds are normal. There is no distension.     Palpations: Abdomen is soft. There is no mass.     Tenderness: There is no abdominal tenderness. There is no right CVA tenderness, left CVA tenderness, guarding or rebound.  Musculoskeletal: Normal range of motion.        General: No swelling, tenderness or signs of injury.     Right lower leg: No edema.     Left lower leg: No edema.  Lymphadenopathy:     Cervical: No cervical adenopathy.  Skin:    General: Skin is warm and dry.     Coloration: Skin is not pale.     Findings: No erythema, lesion or rash.  Neurological:     Mental Status: She is alert and oriented to person, place, and time.     Cranial Nerves: No cranial nerve deficit.     Sensory: No sensory deficit.     Motor: No weakness.     Coordination: Coordination normal.     Gait: Gait normal.  Psychiatric:        Mood and Affect: Mood normal.        Behavior: Behavior normal.        Thought Content: Thought content normal.        Judgment: Judgment normal.    Labs reviewed: Recent Labs    11/08/18 0434  NA 135  K 4.1  CL 101  CO2 24  GLUCOSE 146*  BUN 8  CREATININE 0.70  CALCIUM 8.2*   Recent Labs    11/08/18 0434  AST 20  ALT 23  ALKPHOS 88  BILITOT 0.6  PROT 8.5*  ALBUMIN 3.7   Recent Labs    11/08/18 0434  WBC 11.8*  NEUTROABS 10.5*  HGB 8.6*  HCT 31.1*  MCV 76.0*  PLT 375   Lab Results  Component Value Date   TSH 1.93 03/23/2017   Lab Results  Component Value Date   HGBA1C 5.0 07/14/2016   Lab Results  Component Value Date   CHOL 104 03/23/2017   HDL 63 03/23/2017   LDLCALC 29 03/23/2017   TRIG 58 03/23/2017   CHOLHDL 1.7 03/23/2017    Significant Diagnostic Results in last 30 days:  Dg Chest 2 View  Result Date: 11/08/2018 CLINICAL DATA:  Asthma with shortness of breath EXAM: CHEST - 2 VIEW COMPARISON:  12/31/2015 FINDINGS: Borderline heart size. Central airway  cuffing. There is no edema, consolidation, effusion, or pneumothorax. No significant osseous finding. Borderline hyperinflation on the lateral view. IMPRESSION: 1. Airway thickening and borderline cardiomegaly. 2. Possible hyperinflation. 3. Stable from 2017. Electronically Signed   By: Monte Fantasia M.D.   On: 11/08/2018 06:13    Assessment/Plan 1. Moderate persistent asthma with exacerbation Afebrile.Status post ED visit 11/08/2018 with exacerbation.Symptoms have improved with Prednisone 20 mg tablet.encouraged to complete Prednisone as directed in the ED.continue on singular 10 mg tablet daily at bedtime,Advair 2 puffs twice daily and Albuterol via Nebulization.  - DME Nebulizer machine  2. Iron deficiency anemia, unspecified iron deficiency anemia type Hgb 8.6 ( 11/08/2018) in ED.continue on Ferrous sulfate 325 mg tablet daily.On Provera 10 mg tablet daily as directed by Gynecologist for heavy vaginal bleeding.will Recheck CBC on 11/28/2018. Encouraged to increase dark leafy vegetables and beets in her diet.   3. FMLA   Paperwork filled this visit has above asthma exacerbation occasional up to 3 episodes per year requiring 2-3 days off her work this is ongoing since 2015.     Family/ staff Communication: Reviewed plan of care with patient. Labs/tests ordered: None   Follow up : 11/28/2018 for medical management of chronic issues /fasting labs.  Time spent with patient 25 minutes >50% time spent counseling; reviewing medical record;tests; labs; Filling FMLA forms and developing future plan of care  Dinah Shelva Majestic, NP

## 2018-11-09 NOTE — Patient Instructions (Signed)
Tobacco Use Disorder Tobacco use disorder (TUD) occurs when a person craves, seeks, and uses tobacco, regardless of the consequences. This disorder can cause problems with mental and physical health. It can affect your ability to have healthy relationships, and it can keep you from meeting your responsibilities at work, home, or school. Tobacco may be:  Smoked as a cigarette or cigar.  Inhaled using e-cigarettes.  Smoked in a pipe or hookah.  Chewed as smokeless tobacco.  Inhaled into the nostrils as snuff. Tobacco products contain a dangerous chemical called nicotine, which is very addictive. Nicotine triggers hormones that make the body feel stimulated and works on areas of the brain that make you feel good. These effects can make it hard for people to quit nicotine. Tobacco contains many other unsafe chemicals that can damage almost every organ in the body. Smoking tobacco also puts others in danger due to fire risk and possible health problems caused by breathing in secondhand smoke. What are the signs or symptoms? Symptoms of TUD may include:  Being unable to slow down or stop your tobacco use.  Spending an abnormal amount of time getting or using tobacco.  Craving tobacco. Cravings may last for up to 6 months after quitting.  Tobacco use that: ? Interferes with your work, school, or home life. ? Interferes with your personal and social relationships. ? Makes you give up activities that you once enjoyed or found important.  Using tobacco even though you know that it is: ? Dangerous or bad for your health or someone else's health. ? Causing problems in your life.  Needing more and more of the substance to get the same effect (developing tolerance).  Experiencing unpleasant symptoms if you do not use the substance (withdrawal). Withdrawal symptoms may include: ? Depressed, anxious, or irritable mood. ? Difficulty concentrating. ? Increased appetite. ? Restlessness or  trouble sleeping.  Using the substance to avoid withdrawal. How is this diagnosed? This condition may be diagnosed based on:  Your current and past tobacco use. Your health care provider may ask questions about how your tobacco use affects your life.  A physical exam. You may be diagnosed with TUD if you have at least two symptoms within a 52-month period. How is this treated? This condition is treated by stopping tobacco use. Many people are unable to quit on their own and need help. Treatment may include:  Nicotine replacement therapy (NRT). NRT provides nicotine without the other harmful chemicals in tobacco. NRT gradually lowers the dosage of nicotine in the body and reduces withdrawal symptoms. NRT is available as: ? Over-the-counter gums, lozenges, and skin patches. ? Prescription mouth inhalers and nasal sprays.  Medicine that acts on the brain to reduce cravings and withdrawal symptoms.  A type of talk therapy that examines your triggers for tobacco use, how to avoid them, and how to cope with cravings (behavioral therapy).  Hypnosis. This may help with withdrawal symptoms.  Joining a support group for others coping with TUD. The best treatment for TUD is usually a combination of medicine, talk therapy, and support groups. Recovery can be a long process. Many people start using tobacco again after stopping (relapse). If you relapse, it does not mean that treatment will not work. Follow these instructions at home:  Lifestyle  Do not use any products that contain nicotine or tobacco, such as cigarettes and e-cigarettes.  Avoid things that trigger tobacco use as much as you can. Triggers include people and situations that usually cause you  to use tobacco.  Avoid drinks that contain caffeine, including coffee. These may worsen some withdrawal symptoms.  Find ways to manage stress. Wanting to smoke may cause stress, and stress can make you want to smoke. Relaxation techniques  such as deep breathing, meditation, and yoga may help.  Attend support groups as needed. These groups are an important part of long-term recovery for many people. General instructions  Take over-the-counter and prescription medicines only as told by your health care provider.  Check with your health care provider before taking any new prescription or over-the-counter medicines.  Decide on a friend, family member, or smoking quit-line (such as 1-800-QUIT-NOW in the [place] that you can call or text when you feel the urge to smoke or when you need help coping with cravings.  Keep all follow-up visits as told by your health care provider and therapist. This is important. Contact a health care provider if:  You are not able to take your medicines as prescribed.  Your symptoms get worse, even with treatment. Summary  Tobacco use disorder (TUD) occurs when a person craves, seeks, and uses tobacco regardless of the consequences.  This condition may be diagnosed based on your current and past tobacco use and a physical exam.  Many people are unable to quit on their own and need help. Recovery can be a long process.  The most effective treatment for TUD is usually a combination of medicine, talk therapy, and support groups. This information is not intended to replace advice given to you by your health care provider. Make sure you discuss any questions you have with your health care provider. Document Released: 05/19/2004 Document Revised: 08/31/2017 Document Reviewed: 08/31/2017 Elsevier Interactive Patient Education  2019 [employer]. Asthma, Adult  Asthma is a long-term (chronic) condition in which the airways get tight and narrow. The airways are the breathing passages that lead from the nose and mouth down into the lungs. A person with asthma will have times when symptoms get worse. These are called asthma attacks. They can cause coughing, whistling sounds when you breathe (wheezing),  shortness of breath, and chest pain. They can make it hard to breathe. There is no cure for asthma, but medicines and lifestyle changes can help control it. There are many things that can bring on an asthma attack or make asthma symptoms worse (triggers). Common triggers include:  Mold.  Dust.  Cigarette smoke.  Cockroaches.  Things that can cause allergy symptoms (allergens). These include animal skin flakes (dander) and pollen from trees or grass.  Things that pollute the air. These may include household cleaners, wood smoke, smog, or chemical odors.  Cold air, weather changes, and wind.  Crying or laughing hard.  Stress.  Certain medicines or drugs.  Certain foods such as dried fruit, potato chips, and grape juice.  Infections, such as a cold or the flu.  Certain medical conditions or diseases.  Exercise or tiring activities. Asthma may be treated with medicines and by staying away from the things that cause asthma attacks. Types of medicines may include:  Controller medicines. These help prevent asthma symptoms. They are usually taken every day.  Fast-acting reliever or rescue medicines. These quickly relieve asthma symptoms. They are used as needed and provide short-term relief.  Allergy medicines if your attacks are brought on by allergens.  Medicines to help control the body's defense (immune) system. Follow these instructions at home: Avoiding triggers in your home  Change your heating and air conditioning filter often.  Limit your use of fireplaces and wood stoves.  Get rid of pests (such as roaches and mice) and their droppings.  Throw away plants if you see mold on them.  Clean your floors. Dust regularly. Use cleaning products that do not smell.  Have someone vacuum when you are not home. Use a vacuum cleaner with a HEPA filter if possible.  Replace carpet with wood, tile, or vinyl flooring. Carpet can trap animal skin flakes and dust.  Use  allergy-proof pillows, mattress covers, and box spring covers.  Wash bed sheets and blankets every week in hot water. Dry them in a dryer.  Keep your bedroom free of any triggers.  Avoid pets and keep windows closed when things that cause allergy symptoms are in the air.  Use blankets that are made of polyester or cotton.  Clean bathrooms and kitchens with bleach. If possible, have someone repaint the walls in these rooms with mold-resistant paint. Keep out of the rooms that are being cleaned and painted.  Wash your hands often with soap and water. If soap and water are not available, use hand sanitizer.  Do not allow anyone to smoke in your home. General instructions  Take over-the-counter and prescription medicines only as told by your doctor. ? Talk with your doctor if you have questions about how or when to take your medicines. ? Make note if you need to use your medicines more often than usual.  Do not use any products that contain nicotine or tobacco, such as cigarettes and e-cigarettes. If you need help quitting, ask your doctor.  Stay away from secondhand smoke.  Avoid doing things outdoors when allergen counts are high and when air quality is low.  Wear a ski mask when doing outdoor activities in the winter. The mask should cover your nose and mouth. Exercise indoors on cold days if you can.  Warm up before you exercise. Take time to cool down after exercise.  Use a peak flow meter as told by your doctor. A peak flow meter is a tool that measures how well the lungs are working.  Keep track of the peak flow meter's readings. Write them down.  Follow your asthma action plan. This is a written plan for taking care of your asthma and treating your attacks.  Make sure you get all the shots (vaccines) that your doctor recommends. Ask your doctor about a flu shot and a pneumonia shot.  Keep all follow-up visits as told by your doctor. This is important. Contact a doctor  if:  You have wheezing, shortness of breath, or a cough even while taking medicine to prevent attacks.  The mucus you cough up (sputum) is thicker than usual.  The mucus you cough up changes from clear or white to yellow, green, gray, or bloody.  You have problems from the medicine you are taking, such as: ? A rash. ? Itching. ? Swelling. ? Trouble breathing.  You need reliever medicines more than 2-3 times a week.  Your peak flow reading is still at 50-79% of your personal best after following the action plan for 1 hour.  You have a fever. Get help right away if:  You seem to be worse and are not responding to medicine during an asthma attack.  You are short of breath even at rest.  You get short of breath when doing very little activity.  You have trouble eating, drinking, or talking.  You have chest pain or tightness.  You have a fast  heartbeat.  Your lips or fingernails start to turn blue.  You are light-headed or dizzy, or you faint.  Your peak flow is less than 50% of your personal best.  You feel too tired to breathe normally. Summary  Asthma is a long-term (chronic) condition in which the airways get tight and narrow. An asthma attack can make it hard to breathe.  Asthma cannot be cured, but medicines and lifestyle changes can help control it.  Make sure you understand how to avoid triggers and how and when to use your medicines. This information is not intended to replace advice given to you by your health care provider. Make sure you discuss any questions you have with your health care provider. Document Released: 03/01/2008 Document Revised: 10/18/2016 Document Reviewed: 10/18/2016 Elsevier Interactive Patient Education  2019 [employer].

## 2018-11-20 ENCOUNTER — Encounter (HOSPITAL_COMMUNITY): Payer: Self-pay

## 2018-11-20 ENCOUNTER — Observation Stay (HOSPITAL_COMMUNITY)
Admission: EM | Admit: 2018-11-20 | Discharge: 2018-11-21 | Disposition: A | Discharge status: Home / Self Care | Payer: 59 | Attending: Family Medicine | Admitting: Family Medicine

## 2018-11-20 ENCOUNTER — Emergency Department (HOSPITAL_COMMUNITY): Payer: 59

## 2018-11-20 ENCOUNTER — Other Ambulatory Visit: Payer: Self-pay

## 2018-11-20 DIAGNOSIS — J45901 Unspecified asthma with (acute) exacerbation: Secondary | ICD-10-CM | POA: Diagnosis not present

## 2018-11-20 DIAGNOSIS — Z7952 Long term (current) use of systemic steroids: Secondary | ICD-10-CM | POA: Diagnosis not present

## 2018-11-20 DIAGNOSIS — Z79899 Other long term (current) drug therapy: Secondary | ICD-10-CM | POA: Diagnosis not present

## 2018-11-20 DIAGNOSIS — F17210 Nicotine dependence, cigarettes, uncomplicated: Secondary | ICD-10-CM | POA: Insufficient documentation

## 2018-11-20 DIAGNOSIS — R0902 Hypoxemia: Secondary | ICD-10-CM

## 2018-11-20 DIAGNOSIS — Z6841 Body Mass Index (BMI) 40.0 and over, adult: Secondary | ICD-10-CM | POA: Diagnosis not present

## 2018-11-20 DIAGNOSIS — Z7951 Long term (current) use of inhaled steroids: Secondary | ICD-10-CM | POA: Insufficient documentation

## 2018-11-20 DIAGNOSIS — J449 Chronic obstructive pulmonary disease, unspecified: Secondary | ICD-10-CM | POA: Insufficient documentation

## 2018-11-20 DIAGNOSIS — Z793 Long term (current) use of hormonal contraceptives: Secondary | ICD-10-CM | POA: Diagnosis not present

## 2018-11-20 DIAGNOSIS — D509 Iron deficiency anemia, unspecified: Secondary | ICD-10-CM | POA: Diagnosis not present

## 2018-11-20 DIAGNOSIS — D500 Iron deficiency anemia secondary to blood loss (chronic): Secondary | ICD-10-CM | POA: Diagnosis not present

## 2018-11-20 DIAGNOSIS — J9601 Acute respiratory failure with hypoxia: Principal | ICD-10-CM | POA: Insufficient documentation

## 2018-11-20 DIAGNOSIS — Z8249 Family history of ischemic heart disease and other diseases of the circulatory system: Secondary | ICD-10-CM | POA: Insufficient documentation

## 2018-11-20 DIAGNOSIS — J9600 Acute respiratory failure, unspecified whether with hypoxia or hypercapnia: Secondary | ICD-10-CM | POA: Diagnosis present

## 2018-11-20 LAB — BLOOD GAS, ARTERIAL
Acid-base deficit: 0.2 mmol/L (ref 0.0–2.0)
Bicarbonate: 24.5 mmol/L (ref 20.0–28.0)
Drawn by: 441261
O2 Content: 2 L/min
O2 SAT: 94.3 %
Patient temperature: 98.6
pCO2 arterial: 43.2 mmHg (ref 32.0–48.0)
pH, Arterial: 7.372 (ref 7.350–7.450)
pO2, Arterial: 77 mmHg — ABNORMAL LOW (ref 83.0–108.0)

## 2018-11-20 LAB — CBC WITH DIFFERENTIAL/PLATELET
Abs Immature Granulocytes: 0.13 10*3/uL — ABNORMAL HIGH (ref 0.00–0.07)
Basophils Absolute: 0.1 10*3/uL (ref 0.0–0.1)
Basophils Relative: 0 %
Eosinophils Absolute: 0 10*3/uL (ref 0.0–0.5)
Eosinophils Relative: 0 %
HCT: 31.8 % — ABNORMAL LOW (ref 36.0–46.0)
Hemoglobin: 8.5 g/dL — ABNORMAL LOW (ref 12.0–15.0)
Immature Granulocytes: 1 %
Lymphocytes Relative: 3 %
Lymphs Abs: 0.5 10*3/uL — ABNORMAL LOW (ref 0.7–4.0)
MCH: 20.2 pg — AB (ref 26.0–34.0)
MCHC: 26.7 g/dL — ABNORMAL LOW (ref 30.0–36.0)
MCV: 75.5 fL — ABNORMAL LOW (ref 80.0–100.0)
MONO ABS: 0.1 10*3/uL (ref 0.1–1.0)
Monocytes Relative: 1 %
Neutro Abs: 13.7 10*3/uL — ABNORMAL HIGH (ref 1.7–7.7)
Neutrophils Relative %: 95 %
Platelets: 445 10*3/uL — ABNORMAL HIGH (ref 150–400)
RBC: 4.21 MIL/uL (ref 3.87–5.11)
RDW: 19.5 % — ABNORMAL HIGH (ref 11.5–15.5)
WBC: 14.5 10*3/uL — AB (ref 4.0–10.5)
nRBC: 0 % (ref 0.0–0.2)

## 2018-11-20 LAB — BASIC METABOLIC PANEL
Anion gap: 8 (ref 5–15)
BUN: 11 mg/dL (ref 6–20)
CO2: 26 mmol/L (ref 22–32)
Calcium: 8.6 mg/dL — ABNORMAL LOW (ref 8.9–10.3)
Chloride: 105 mmol/L (ref 98–111)
Creatinine, Ser: 0.73 mg/dL (ref 0.44–1.00)
GFR calc Af Amer: >60 mL/min (ref >60)
Glucose, Bld: 174 mg/dL — ABNORMAL HIGH (ref 70–99)
Potassium: 3.9 mmol/L (ref 3.5–5.1)
SODIUM: 139 mmol/L (ref 135–145)

## 2018-11-20 LAB — D-DIMER, QUANTITATIVE (NOT AT ARMC): D DIMER QUANT: 0.42 ug{FEU}/mL (ref 0.00–0.50)

## 2018-11-20 LAB — TROPONIN I: Troponin I: <0.03 ng/mL (ref <0.03)

## 2018-11-20 MED ORDER — ACETAMINOPHEN 650 MG RE SUPP: 650.0000 mg | Freq: Four times a day (QID) | RECTAL | Status: DC | PRN

## 2018-11-20 MED ORDER — ALBUTEROL (5 MG/ML) CONTINUOUS INHALATION SOLN
7.5000 mg/h | INHALATION_SOLUTION | Freq: Once | RESPIRATORY_TRACT | Status: AC
  Administered 2018-11-20: 7.5 mg/h via RESPIRATORY_TRACT
  Filled 2018-11-20: qty 20

## 2018-11-20 MED ORDER — ONDANSETRON HCL 4 MG/2ML IJ SOLN: 4.0000 mg | Freq: Four times a day (QID) | INTRAMUSCULAR | Status: DC | PRN

## 2018-11-20 MED ORDER — MONTELUKAST SODIUM 10 MG PO TABS
10.0000 mg | ORAL_TABLET | Freq: Every day | ORAL | Status: DC
  Administered 2018-11-20: 10 mg via ORAL
  Filled 2018-11-20: qty 1

## 2018-11-20 MED ORDER — MAGNESIUM SULFATE 2 GM/50ML IV SOLN
2.0000 g | Freq: Once | INTRAVENOUS | Status: AC
  Administered 2018-11-20: 2 g via INTRAVENOUS
  Filled 2018-11-20: qty 50

## 2018-11-20 MED ORDER — PREDNISONE 20 MG PO TABS
40.0000 mg | ORAL_TABLET | Freq: Two times a day (BID) | ORAL | Status: DC
  Administered 2018-11-20 – 2018-11-21 (×2): 40 mg via ORAL
  Filled 2018-11-20 (×2): qty 2

## 2018-11-20 MED ORDER — BUDESONIDE 0.25 MG/2ML IN SUSP
0.2500 mg | Freq: Two times a day (BID) | RESPIRATORY_TRACT | Status: DC
  Administered 2018-11-20 – 2018-11-21 (×2): 0.25 mg via RESPIRATORY_TRACT
  Filled 2018-11-20 (×2): qty 2

## 2018-11-20 MED ORDER — FERROUS SULFATE 325 (65 FE) MG PO TABS
325.0000 mg | ORAL_TABLET | Freq: Every day | ORAL | Status: DC
  Administered 2018-11-20 – 2018-11-21 (×2): 325 mg via ORAL
  Filled 2018-11-20 (×2): qty 1

## 2018-11-20 MED ORDER — NICOTINE 14 MG/24HR TD PT24
14.0000 mg | MEDICATED_PATCH | Freq: Every day | TRANSDERMAL | Status: DC
  Administered 2018-11-21: 14 mg via TRANSDERMAL
  Filled 2018-11-20 (×2): qty 1

## 2018-11-20 MED ORDER — ALBUTEROL SULFATE (2.5 MG/3ML) 0.083% IN NEBU: 2.5000 mg | INHALATION_SOLUTION | RESPIRATORY_TRACT | Status: DC | PRN

## 2018-11-20 MED ORDER — IPRATROPIUM-ALBUTEROL 0.5-2.5 (3) MG/3ML IN SOLN
3.0000 mL | Freq: Four times a day (QID) | RESPIRATORY_TRACT | Status: DC
  Administered 2018-11-20 – 2018-11-21 (×2): 3 mL via RESPIRATORY_TRACT
  Filled 2018-11-20 (×2): qty 3

## 2018-11-20 MED ORDER — SODIUM CHLORIDE 0.9 % IV SOLN: 250.0000 mL | INTRAVENOUS | Status: DC | PRN

## 2018-11-20 MED ORDER — IPRATROPIUM BROMIDE 0.02 % IN SOLN
RESPIRATORY_TRACT | Status: AC
  Administered 2018-11-20: 0.5 mg
  Filled 2018-11-20: qty 2.5

## 2018-11-20 MED ORDER — ONDANSETRON HCL 4 MG PO TABS: 4.0000 mg | ORAL_TABLET | Freq: Four times a day (QID) | ORAL | Status: DC | PRN

## 2018-11-20 MED ORDER — ACETAMINOPHEN 325 MG PO TABS: 650.0000 mg | ORAL_TABLET | Freq: Four times a day (QID) | ORAL | Status: DC | PRN

## 2018-11-20 MED ORDER — SODIUM CHLORIDE 0.9% FLUSH: 3.0000 mL | INTRAVENOUS | Status: DC | PRN

## 2018-11-20 MED ORDER — SODIUM CHLORIDE 0.9% FLUSH: 3.0000 mL | Freq: Two times a day (BID) | INTRAVENOUS | Status: DC

## 2018-11-20 MED ORDER — HEPARIN SODIUM (PORCINE) 5000 UNIT/ML IJ SOLN
5000.0000 [IU] | Freq: Three times a day (TID) | INTRAMUSCULAR | Status: DC
  Administered 2018-11-20 – 2018-11-21 (×2): 5000 [IU] via SUBCUTANEOUS
  Filled 2018-11-20: qty 1

## 2018-11-20 NOTE — ED Triage Notes (Signed)
Pt arrives from home with [facility]. Pt reports since Friday she has had worsening SHOB. Pt has hx of asthma. No relief with home medications. EMS administered 10mg  Albuterol, 1mg  Atrovent, 125mg  Solu Medrol

## 2018-11-20 NOTE — ED Notes (Signed)
Respiratory at bedside.

## 2018-11-20 NOTE — ED Notes (Signed)
ED TO INPATIENT HANDOFF REPORT  Name/Age/Gender Almedia Balls Seeberger 43 y.o. female  Code Status Code Status History    Date Active Date Inactive Code Status Order ID Comments User Context   08/27/2015 0234 08/28/2015 1636 Full Code [id number]  Ivor Costa, MD ED   04/28/2015 1700 04/30/2015 1557 Full Code [id number]  Charlynne Cousins, MD Inpatient   12/01/2014 0336 12/01/2014 1638 Full Code [id number]  Theressa Millard, MD Inpatient   09/30/2014 2054 10/02/2014 2204 Full Code [id number]  Waldemar Dickens, MD Inpatient   08/13/2014 2344 08/14/2014 1631 Full Code [id number]  Allyne Gee, MD Inpatient   06/27/2014 1142 06/28/2014 1801 Full Code [id number]  Orson Eva, MD Inpatient   05/12/2014 2209 05/14/2014 1732 Full Code [id number]  Elgergawy, Silver Huguenin, MD Inpatient      Home/SNF/Other Given to floor  Chief Complaint asthma   Level of Care/Admitting Diagnosis ED Disposition    ED Disposition Condition [facility]: [facility] [[id number]]  Level of Care: Med-Surg [16]  Diagnosis: Acute respiratory failure ([place]) [518.81.ICD-9-CM]  Admitting Physician: Bonnielee Haff [[id number]]  Attending Physician: Bonnielee Haff [[id number]]  PT Class (Do Not Modify): Observation [104]  PT Acc Code (Do Not Modify): Observation [10022]       Medical History Past Medical History:  Diagnosis Date  . Anemia   . Asthma   . Bronchitis   . COPD (chronic obstructive pulmonary disease) ([place])   . Obesity     Allergies Allergies  Allergen Reactions  . Penicillins     Childhood Allergy Did it involve swelling of the face/tongue/throat, SOB, or low BP? UNK Did it involve sudden or severe rash/hives, skin peeling, or any reaction on the inside of your mouth or nose? UNK Did you need to seek medical attention at a hospital or doctor's office? UNK When did it last happen?more than 10 years If all above answers are "NO", may proceed with cephalosporin use.     IV  Location/Drains/Wounds Patient Lines/Drains/Airways Status   Active Line/Drains/Airways    Name:   Placement date:   Placement time:   Site:   Days:   Peripheral IV 11/08/18 Left Hand   11/08/18    0620    Hand   12   Peripheral IV 11/20/18 Right Antecubital   11/20/18    -    Antecubital   less than 1          Labs/Imaging Results for orders placed or performed during the hospital encounter of 11/20/18 (from the past 48 hour(s))  D-dimer, quantitative (not at [facility])     Status: None   Collection Time: 11/20/18  2:36 PM  Result Value Ref Range   D-Dimer, Quant 0.42 0.00 - 0.50 ug/mL-FEU    Comment: (NOTE) At the manufacturer cut-off of 0.50 ug/mL FEU, this assay has been documented to exclude PE with a sensitivity and negative predictive value of 97 to 99%.  At this time, this assay has not been approved by the FDA to exclude DVT/VTE. Results should be correlated with clinical presentation. Performed at [facility], [place] [address], [place], [place] [address]   Basic metabolic panel     Status: Abnormal   Collection Time: 11/20/18  2:36 PM  Result Value Ref Range   Sodium 139 135 - 145 mmol/L   Potassium 3.9 3.5 - 5.1 mmol/L   Chloride 105 98 - 111 mmol/L   CO2 26 22 - 32 mmol/L  Glucose, Bld 174 (H) 70 - 99 mg/dL   BUN 11 6 - 20 mg/dL   Creatinine, Ser 0.73 0.44 - 1.00 mg/dL   Calcium 8.6 (L) 8.9 - 10.3 mg/dL   GFR calc non Af Amer >60 >60 mL/min   GFR calc Af Amer >60 >60 mL/min   Anion gap 8 5 - 15    Comment: Performed at [facility], [place] [address], [place], [place] [address]  CBC with Differential     Status: Abnormal   Collection Time: 11/20/18  2:36 PM  Result Value Ref Range   WBC 14.5 (H) 4.0 - 10.5 K/uL   RBC 4.21 3.87 - 5.11 MIL/uL   Hemoglobin 8.5 (L) 12.0 - 15.0 g/dL    Comment: Reticulocyte Hemoglobin testing may be clinically indicated, consider ordering this additional test [id number]    HCT 31.8 (L) 36.0 - 46.0  %   MCV 75.5 (L) 80.0 - 100.0 fL   MCH 20.2 (L) 26.0 - 34.0 pg   MCHC 26.7 (L) 30.0 - 36.0 g/dL   RDW 19.5 (H) 11.5 - 15.5 %   Platelets 445 (H) 150 - 400 K/uL   nRBC 0.0 0.0 - 0.2 %   Neutrophils Relative % 95 %   Neutro Abs 13.7 (H) 1.7 - 7.7 K/uL   Lymphocytes Relative 3 %   Lymphs Abs 0.5 (L) 0.7 - 4.0 K/uL   Monocytes Relative 1 %   Monocytes Absolute 0.1 0.1 - 1.0 K/uL   Eosinophils Relative 0 %   Eosinophils Absolute 0.0 0.0 - 0.5 K/uL   Basophils Relative 0 %   Basophils Absolute 0.1 0.0 - 0.1 K/uL   Immature Granulocytes 1 %   Abs Immature Granulocytes 0.13 (H) 0.00 - 0.07 K/uL    Comment: Performed at [facility], [place] [address], [place], [place] [address]  Troponin I - Once     Status: None   Collection Time: 11/20/18  2:36 PM  Result Value Ref Range   Troponin I <0.03 <0.03 ng/mL    Comment: Performed at [facility], [place] [address], [place],  [address]   Dg Chest 2 View  Result Date: 11/20/2018 CLINICAL DATA:  Shortness of breath.  History of asthma. EXAM: CHEST - 2 VIEW COMPARISON:  11/08/2018 FINDINGS: The heart is mildly enlarged but appears stable given the AP projection. The lungs are clear. No pleural effusions. The bony thorax is intact. IMPRESSION: No acute cardiopulmonary findings. Stable borderline cardiac enlargement. Electronically Signed   By: Marijo Sanes M.D.   On: 11/20/2018 12:56    Pending Labs Unresulted Labs (From admission, onward)    Start     Ordered   11/20/18 1609  Pregnancy, urine  ONCE - STAT,   R     11/20/18 1608   11/20/18 1609  Urinalysis, Routine w reflex microscopic  Once,   R     11/20/18 1608   Signed and Held  HIV antibody (Routine Testing)  Tomorrow morning,   R     Signed and Held   Signed and Held  CBC  Tomorrow morning,   R     Signed and Held   Signed and Held  Comprehensive metabolic panel  Tomorrow morning,   R     Signed and Held          Vitals/Pain Today's Vitals    11/20/18 1346 11/20/18 1501 11/20/18 1530 11/20/18 1533  BP:  133/60 131/62   Pulse:  Marland Kitchen)  102 88   Resp:  20 18   Temp:      TempSrc:      SpO2: (!) 84% 97% 99%   Weight:      Height:      PainSc:    0-No pain    Isolation Precautions No active isolations  Medications Medications  magnesium sulfate IVPB 2 g 50 mL ( Intravenous Stopped 11/20/18 1239)  albuterol (PROVENTIL,VENTOLIN) solution continuous neb (7.5 mg/hr Nebulization Given 11/20/18 1125)  ipratropium (ATROVENT) 0.02 % nebulizer solution (0.5 mg  Given 11/20/18 1128)    Mobility walks with person assist

## 2018-11-20 NOTE — ED Notes (Signed)
Pt may eat per MD: Pt provided with sandwich and gingerale

## 2018-11-20 NOTE — H&P (Addendum)
[facility] History and Physical  April Ellis:[id number] DOB: 02-May-1976 DOA: 11/20/2018   PCP: Sandrea Hughs, NP  Specialists: None  Chief Complaint: Shortness of breath since Friday  HPI: April Ellis is a 43 y.o. female with a past medical history of asthma, morbid obesity, tobacco abuse which is ongoing, history of iron deficiency anemia presented with complains of shortness of breath with wheezing starting Friday.  Patient also had a cough without any expectoration.  Some nausea but no vomiting.  No chest pain.  No fever.  No sick contacts.  No recent travel.  She took her rescue inhaler multiple times in the last 3 days without any improvement.  She has been compliant with her Advair.  Since her symptoms were not getting better she decided to come into the emergency department.  In the emergency department patient was given nebulizer treatments, steroids, magnesium.  Patient has improved.  She was ambulated however her oxygen saturation dropped into the mid 80s.  She felt short of breath.  She will need to be brought into the hospital for further management.  Patient was seen in the emergency department 2 weeks ago for similar complaints.  At that time she was released from here after she improved.  Home Medications: Prior to Admission medications   Medication Sig Start Date End Date Taking? Authorizing Provider  albuterol (PROVENTIL HFA;VENTOLIN HFA) 108 (90 Base) MCG/ACT inhaler Inhale 1-2 puffs into the lungs every 6 (six) hours as needed for wheezing or shortness of breath. 11/08/18  Yes Carlisle Cater, PA-C  albuterol (PROVENTIL) (2.5 MG/3ML) 0.083% nebulizer solution Take 6 mLs (5 mg total) by nebulization every 4 (four) hours as needed for wheezing or shortness of breath. 11/08/18  Yes Carlisle Cater, PA-C  ferrous sulfate 325 (65 FE) MG EC tablet Take 325 mg by mouth daily.   Yes [provider]  fluticasone-salmeterol (ADVAIR HFA) 115-21 MCG/ACT inhaler  INHALE 1 TO 2 PUFFS EVERY 12 HOURS 11/08/18  Yes Carlisle Cater, PA-C  montelukast (SINGULAIR) 10 MG tablet TAKE 1 TABLET (10 MG TOTAL) BY MOUTH AT BEDTIME. 05/18/18  Yes Gildardo Cranker, DO  clindamycin (CLEOCIN) 150 MG capsule Take 150 mg by mouth 4 (four) times daily. 11/18/18   [provider]  ibuprofen (ADVIL,MOTRIN) 800 MG tablet Take 800 mg by mouth 4 (four) times daily as needed for pain. 11/18/18   [provider]  predniSONE (DELTASONE) 20 MG tablet Take 2 tablets (40 mg total) by mouth daily. Patient not taking: Reported on 11/20/2018 11/08/18   Carlisle Cater, PA-C    Allergies:  Allergies  Allergen Reactions  . Penicillins     Childhood Allergy Did it involve swelling of the face/tongue/throat, SOB, or low BP? UNK Did it involve sudden or severe rash/hives, skin peeling, or any reaction on the inside of your mouth or nose? UNK Did you need to seek medical attention at a hospital or doctor's office? UNK When did it last happen?more than 10 years If all above answers are "NO", may proceed with cephalosporin use.     Past Medical History: Past Medical History:  Diagnosis Date  . Anemia   . Asthma   . Bronchitis   . COPD (chronic obstructive pulmonary disease) ([place])   . Obesity     Past Surgical History:  Procedure Laterality Date  . DENTAL SURGERY     2 teeth pulled, 11/22/14  . NO PAST SURGERIES      Social History: She lives with her  sister.  She continues to smoke cigarettes up to 1 pack of cigarettes on a daily basis.  She has approximately 15-18-pack-year history of smoking.  Denies any alcohol use.  No illicit drug use.   Family History:  Family History  Problem Relation Age of Onset  . Diabetes Mother   . Cancer Mother   . Diabetes Sister   . Hypertension Sister   . Hypertension Sister   . Urticaria Neg Hx   . Immunodeficiency Neg Hx   . Eczema Neg Hx   . Asthma Neg Hx   . Angioedema Neg Hx   . Allergic rhinitis Neg Hx       Review of Systems - History obtained from the patient General ROS: positive for  - fatigue Psychological ROS: negative Ophthalmic ROS: negative ENT ROS: negative Allergy and Immunology ROS: negative Hematological and Lymphatic ROS: negative Endocrine ROS: negative Respiratory ROS: as in hpi Cardiovascular ROS: as in hpi Gastrointestinal ROS: She does mention left-sided flank pain ongoing for a few weeks Genito-Urinary ROS: no dysuria, trouble voiding, or hematuria Musculoskeletal ROS: negative Neurological ROS: no TIA or stroke symptoms Dermatological ROS: negative   Physical Examination  Vitals:   11/20/18 1230 11/20/18 1346 11/20/18 1501 11/20/18 1530  BP: (!) 141/74  133/60 131/62  Pulse: 91  (!) 102 88  Resp: 17  20 18   Temp:      TempSrc:      SpO2: 94% (!) 84% 97% 99%  Weight:      Height:        BP 131/62   Pulse 88   Temp 98 F (36.7 C) (Oral)   Resp 18   Ht 5\' 2"  (1.575 m)   Wt (!) 158.8 kg   LMP 11/05/2018   SpO2 99%   BMI 64.02 kg/m   General appearance: alert, cooperative, appears stated age, no distress and morbidly obese Head: Normocephalic, without obvious abnormality, atraumatic Eyes: conjunctivae/corneas clear. PERRL, EOM's intact.  Throat: lips, mucosa, and tongue normal; teeth and gums normal Neck: no adenopathy, no carotid bruit, no JVD, supple, symmetrical, trachea midline and thyroid not enlarged, symmetric, no tenderness/mass/nodules Back: symmetric, no curvature. ROM normal. No CVA tenderness. Resp: Noted to be tachypneic at rest.  No use of accessory muscles.  End expiratory wheezing heard bilaterally but not significantly so.  No crackles.  No rhonchi. Cardio: regular rate and rhythm, S1, S2 normal, no murmur, click, rub or gallop GI: soft, non-tender; bowel sounds normal; no masses,  no organomegaly Extremities: extremities normal, atraumatic, no cyanosis or edema Pulses: 2+ and symmetric Skin: Skin color, texture, turgor normal. No  rashes or lesions Lymph nodes: Cervical, supraclavicular, and axillary nodes normal. Neurologic: Alert and oriented x3.  Cranial nerves II to XII intact.  Motor strength equal bilateral upper and lower extremities.   Labs on Admission: I have personally reviewed following labs and imaging studies  CBC: Recent Labs  Lab 11/20/18 1436  WBC 14.5*  NEUTROABS 13.7*  HGB 8.5*  HCT 31.8*  MCV 75.5*  PLT 270*   Basic Metabolic Panel: Recent Labs  Lab 11/20/18 1436  NA 139  K 3.9  CL 105  CO2 26  GLUCOSE 174*  BUN 11  CREATININE 0.73  CALCIUM 8.6*   GFR: Estimated Creatinine Clearance: 136.7 mL/min (by C-G formula based on SCr of 0.73 mg/dL).   Cardiac Enzymes: Recent Labs  Lab 11/20/18 1436  TROPONINI <0.03     Radiological Exams on Admission: Dg Chest 2 View  Result Date: 11/20/2018 CLINICAL DATA:  Shortness of breath.  History of asthma. EXAM: CHEST - 2 VIEW COMPARISON:  11/08/2018 FINDINGS: The heart is mildly enlarged but appears stable given the AP projection. The lungs are clear. No pleural effusions. The bony thorax is intact. IMPRESSION: No acute cardiopulmonary findings. Stable borderline cardiac enlargement. Electronically Signed   By: Marijo Sanes M.D.   On: 11/20/2018 12:56    My interpretation of Electrocardiogram: Pending   Problem List  Principal Problem:   Acute respiratory failure [facility]) Active Problems:   Morbid obesity (HCC)   Iron deficiency anemia   Asthma, chronic, unspecified asthma severity, with acute exacerbation   Assessment: 43 year old African-American female with past medical history as stated earlier who presents with 3-day history of shortness of breath.  This has not improved with rescue inhaler at home.  Patient was noted to be wheezing when she arrived at the emergency department.  She was given an hour-long treatment with albuterol nebulizer.  She did improve.  However was subsequently noted to be hypoxic with ambulation.   D-dimer was normal.  Troponin normal.  Chest x-ray did not show any acute findings.  Borderline cardiac enlargement was noted.  Plan:  1. Acute respiratory failure with hypoxia: This is likely multifactorial.  Patient does have a history of asthma but it was diagnosed in her mid to late 20s.  She is morbidly obese.  Likely has some component of obesity hypoventilation syndrome.  Plus she smokes cigarettes so likely has a component of chronic bronchitis as well.  She is currently on oxygen here maintaining her saturations in the 90s.  She has never been tested for sleep apnea previously.  Proceed with echocardiogram.  ABG.  She will benefit from a sleep study in the outpatient setting.  Will benefit from referral to pulmonology at the time of discharge.  EKG has been ordered and is pending.  Continue with nebulizer treatments, inhaled steroids and systemic steroids.  No clear indication for antibiotics at this time.  Leukocytosis is most likely due to recent steroid taper.  She is afebrile.  No clear indication to test for influenza.  Check room air saturations tomorrow.  2.  Morbid obesity: BMI 64.  Denies any recent weight changes.  Check TSH.  3.  Iron deficiency anemia: She is noted to be on iron supplements which will be continued.  Hemoglobin seems to be low but at baseline. Based on PCP notes it appears that patient experiences heavy menstrual periods which could be the reason for her anemia.  She thinks she is about to start her menstrual cycle any day now.  UA and urine preg test is pending.  4. Left flank pain: Denies any dysuria or blood in the urine.  Reason for this is not entirely clear.  Wait on UA before pursuing other testing. No CVA tenderness.   DVT Prophylaxis: Subcutaneous heparin Code Status: Full code Family Communication: Discussed with the patient Disposition: Hopefully return home when improved Consults called: None Admission Status: Observation  Severity of Illness: The  appropriate patient status for this patient is OBSERVATION. Observation status is judged to be reasonable and necessary in order to provide the required intensity of service to ensure the patient's safety. The patient's presenting symptoms, physical exam findings, and initial radiographic and laboratory data in the context of their medical condition is felt to place them at decreased risk for further clinical deterioration. Furthermore, it is anticipated that the patient will be medically stable for discharge  from the hospital within 2 midnights of admission. The following factors support the patient status of observation.   " The patient's presenting symptoms include shortness of breath. " The physical exam findings include morbid obesity, and expiratory wheezing. " The initial radiographic and laboratory data are significant for known anemia.   Further management decisions will depend on results of further testing and patient's response to treatment.  April Ellis April Ellis  [facility] operational officer on [email or web address]  11/20/2018, 5:35 PM

## 2018-11-20 NOTE — Plan of Care (Signed)
  Problem: Activity: Goal: Risk for activity intolerance will decrease Outcome: Progressing   Problem: Nutrition: Goal: Adequate nutrition will be maintained Outcome: Progressing   Problem: Elimination: Goal: Will not experience complications related to bowel motility Outcome: Progressing   

## 2018-11-20 NOTE — ED Notes (Signed)
Bed: WA22 Expected date:  Expected time:  Means of arrival:  Comments: EMS-SOB 

## 2018-11-20 NOTE — ED Notes (Signed)
Respiratory notified need for continuous neb.

## 2018-11-20 NOTE — ED Notes (Signed)
MD made aware of pts O2 sat while ambulating and increased work of breathing

## 2018-11-20 NOTE — ED Provider Notes (Signed)
[facility] DEPT Provider Note   CSN: [id number] Arrival date & time: 11/20/18  1029    History   Chief Complaint Chief Complaint  Patient presents with  . Shortness of Breath    HPI XITLALLI NEWHARD is a 43 y.o. female.  She has a history of asthma and possibly COPD still smoking.  She was last here about 2 weeks ago for shortness of breath.  She said she has been feeling increased shortness of breath since 4 days ago has been getting progressively worse.  She is been using her nebulizer and rescue inhaler.  Today she called 911.  They gave her a 10 mg albuterol Atrovent and 125 Solu-Medrol.  Currently she is feeling a little bit better.  No fevers or chills no nausea vomiting or diarrhea.  She has had a little bit of a productive cough.     The history is provided by the patient.  Shortness of Breath  Severity:  Moderate Onset quality:  Gradual Timing:  Constant Progression:  Worsening Chronicity:  Recurrent Context: not URI   Relieved by:  Nothing Worsened by:  Activity, deep breathing and exertion Ineffective treatments:  Inhaler Associated symptoms: cough and wheezing   Associated symptoms: no abdominal pain, no chest pain, no fever, no headaches, no hemoptysis, no neck pain, no rash, no sore throat, no syncope and no vomiting   Risk factors: tobacco use   Risk factors: no hx of cancer     Past Medical History:  Diagnosis Date  . Anemia   . Asthma   . Bronchitis   . COPD (chronic obstructive pulmonary disease) ([place])   . Obesity     Patient Active Problem List   Diagnosis Date Noted  . Allergic reactions 08/16/2016  . Chronic rhinitis 08/16/2016  . Asthma, chronic, unspecified asthma severity, with acute exacerbation 09/30/2014  . Leukocytosis 09/30/2014  . Urticaria   . Iron deficiency anemia 08/14/2014  . History of tobacco abuse 08/14/2014  . Moderate persistent asthma 08/13/2014  . Atypical chest pain 06/27/2014  . COPD  (chronic obstructive pulmonary disease) ([place]) 05/01/2014  . Morbid obesity ([place]) 12/19/2008  . ELEVATED BLOOD PRESSURE WITHOUT DIAGNOSIS OF HYPERTENSION 12/19/2008    Past Surgical History:  Procedure Laterality Date  . DENTAL SURGERY     2 teeth pulled, 11/22/14  . NO PAST SURGERIES       OB History   No obstetric history on file.      Home Medications    Prior to Admission medications   Medication Sig Start Date End Date Taking? Authorizing Provider  ALAYCEN 1/35 tablet Take 1 tablet daily by mouth. mx by GYN Dr Ileene Rubens 07/15/17   [provider]  albuterol (PROVENTIL HFA;VENTOLIN HFA) 108 (90 Base) MCG/ACT inhaler Inhale 1-2 puffs into the lungs every 6 (six) hours as needed for wheezing or shortness of breath. 11/08/18   Carlisle Cater, PA-C  albuterol (PROVENTIL) (2.5 MG/3ML) 0.083% nebulizer solution Take 6 mLs (5 mg total) by nebulization every 4 (four) hours as needed for wheezing or shortness of breath. 11/08/18   Carlisle Cater, PA-C  ferrous sulfate 325 (65 FE) MG EC tablet Take 325 mg by mouth daily.    [provider]  fluticasone-salmeterol (ADVAIR HFA) 937-16 MCG/ACT inhaler INHALE 1 TO 2 PUFFS EVERY 12 HOURS 11/08/18   Carlisle Cater, PA-C  medroxyPROGESTERone (PROVERA) 10 MG tablet Take 10 mg by mouth daily. 10/31/18   [provider]  montelukast (SINGULAIR) 10 MG  tablet TAKE 1 TABLET (10 MG TOTAL) BY MOUTH AT BEDTIME. 05/18/18   Gildardo Cranker, DO  predniSONE (DELTASONE) 20 MG tablet Take 2 tablets (40 mg total) by mouth daily. 11/08/18   Carlisle Cater, PA-C    Family History Family History  Problem Relation Age of Onset  . Diabetes Mother   . Cancer Mother   . Diabetes Sister   . Hypertension Sister   . Hypertension Sister   . Urticaria Neg Hx   . Immunodeficiency Neg Hx   . Eczema Neg Hx   . Asthma Neg Hx   . Angioedema Neg Hx   . Allergic rhinitis Neg Hx     Social History Social History   Tobacco Use  . Smoking status:  Current Every Day Smoker    Packs/day: 0.25    Years: 15.00    Pack years: 3.75    Types: Cigarettes    Last attempt to quit: 09/16/2014    Years since quitting: 4.1  . Smokeless tobacco: Never Used  Substance Use Topics  . Alcohol use: No    Alcohol/week: 0.0 standard drinks  . Drug use: No     Allergies   Patient has no known allergies.   Review of Systems Review of Systems  Constitutional: Negative for fever.  HENT: Negative for sore throat.   Eyes: Negative for visual disturbance.  Respiratory: Positive for cough, shortness of breath and wheezing. Negative for hemoptysis.   Cardiovascular: Negative for chest pain and syncope.  Gastrointestinal: Negative for abdominal pain and vomiting.  Genitourinary: Negative for dysuria.  Musculoskeletal: Negative for neck pain.  Skin: Negative for rash.  Neurological: Negative for headaches.     Physical Exam Updated Vital Signs BP 94/69 (BP Location: Left Arm)   Pulse 92   Temp 98 F (36.7 C) (Oral)   Resp 20   Ht 5\' 2"  (1.575 m)   Wt (!) 158.8 kg   LMP 11/05/2018   SpO2 97%   BMI 64.02 kg/m   Physical Exam Vitals signs and nursing note reviewed.  Constitutional:      General: She is not in acute distress.    Appearance: She is well-developed.  HENT:     Head: Normocephalic and atraumatic.  Eyes:     Conjunctiva/sclera: Conjunctivae normal.  Neck:     Musculoskeletal: Neck supple.  Cardiovascular:     Rate and Rhythm: Normal rate and regular rhythm.     Heart sounds: No murmur.  Pulmonary:     Effort: Pulmonary effort is normal. No respiratory distress.     Breath sounds: Decreased breath sounds present.  Abdominal:     Palpations: Abdomen is soft.     Tenderness: There is no abdominal tenderness.  Musculoskeletal: Normal range of motion.     Right lower leg: She exhibits no tenderness. No edema.     Left lower leg: She exhibits no tenderness. No edema.  Skin:    General: Skin is warm and dry.      Capillary Refill: Capillary refill takes less than 2 seconds.  Neurological:     General: No focal deficit present.     Mental Status: She is alert and oriented to person, place, and time.      ED Treatments / Results  Labs (all labs ordered are listed, but only abnormal results are displayed) Labs Reviewed  BASIC METABOLIC PANEL - Abnormal; Notable for the following components:      Result Value   Glucose, Bld 174 (*)  Calcium 8.6 (*)    All other components within normal limits  CBC WITH DIFFERENTIAL/PLATELET - Abnormal; Notable for the following components:   WBC 14.5 (*)    Hemoglobin 8.5 (*)    HCT 31.8 (*)    MCV 75.5 (*)    MCH 20.2 (*)    MCHC 26.7 (*)    RDW 19.5 (*)    Platelets 445 (*)    Neutro Abs 13.7 (*)    Lymphs Abs 0.5 (*)    Abs Immature Granulocytes 0.13 (*)    All other components within normal limits  D-DIMER, QUANTITATIVE (NOT AT [facility])  TROPONIN I  PREGNANCY, URINE  URINALYSIS, ROUTINE W REFLEX MICROSCOPIC    EKG None  Radiology Dg Chest 2 View  Result Date: 11/20/2018 CLINICAL DATA:  Shortness of breath.  History of asthma. EXAM: CHEST - 2 VIEW COMPARISON:  11/08/2018 FINDINGS: The heart is mildly enlarged but appears stable given the AP projection. The lungs are clear. No pleural effusions. The bony thorax is intact. IMPRESSION: No acute cardiopulmonary findings. Stable borderline cardiac enlargement. Electronically Signed   By: Marijo Sanes M.D.   On: 11/20/2018 12:56    Procedures Procedures (including critical care time)  Medications Ordered in ED Medications  magnesium sulfate IVPB 2 g 50 mL ( Intravenous Stopped 11/20/18 1239)  albuterol (PROVENTIL,VENTOLIN) solution continuous neb (7.5 mg/hr Nebulization Given 11/20/18 1125)  ipratropium (ATROVENT) 0.02 % nebulizer solution (0.5 mg  Given 11/20/18 1128)     Initial Impression / Assessment and Plan / ED Course  I have reviewed the triage vital signs and the nursing  notes.  Pertinent labs & imaging results that were available during my care of the patient were reviewed by me and considered in my medical decision making (see chart for details).  Clinical Course as of Nov 20 1633  Mon Feb 24, 731  6468 43 year old female with history of asthma COPD here with increased shortness of breath for a few days.  She was recently in the ED almost 2 weeks ago and was on steroids for that.  I do not hear much wheezing on her and her sats were okay but she states she feels even worse than the last visit.  Ordered her continuous neb and a chest x-ray.   [MB]  6629 Patient had a trending pulse ox.  She did fairly well until then when she desatted to 84% became more tachypneic while trying to get back into the bed.   [MB]  4765 Reevaluated patient.  She said she feels pretty good right now and does not think she needs any treatments.   [MB]  1550 Discussed with the hospitalist Dr. Maryland Pink who will evaluate the patient in the ED for admission.   [MB]    Clinical Course User Index [MB] Hayden Rasmussen, MD        Final Clinical Impressions(s) / ED Diagnoses   Final diagnoses:  Exacerbation of asthma, unspecified asthma severity, unspecified whether persistent  Hypoxia    ED Discharge Orders    None       Hayden Rasmussen, MD 11/20/18 [phone]

## 2018-11-21 ENCOUNTER — Observation Stay (HOSPITAL_COMMUNITY): Payer: 59

## 2018-11-21 DIAGNOSIS — J45901 Unspecified asthma with (acute) exacerbation: Secondary | ICD-10-CM

## 2018-11-21 DIAGNOSIS — J9601 Acute respiratory failure with hypoxia: Secondary | ICD-10-CM | POA: Diagnosis not present

## 2018-11-21 DIAGNOSIS — D500 Iron deficiency anemia secondary to blood loss (chronic): Secondary | ICD-10-CM

## 2018-11-21 LAB — CBC
HCT: 31.5 % — ABNORMAL LOW (ref 36.0–46.0)
HEMOGLOBIN: 8.1 g/dL — AB (ref 12.0–15.0)
MCH: 19.4 pg — ABNORMAL LOW (ref 26.0–34.0)
MCHC: 25.7 g/dL — ABNORMAL LOW (ref 30.0–36.0)
MCV: 75.5 fL — ABNORMAL LOW (ref 80.0–100.0)
NRBC: 0 % (ref 0.0–0.2)
Platelets: 445 10*3/uL — ABNORMAL HIGH (ref 150–400)
RBC: 4.17 MIL/uL (ref 3.87–5.11)
RDW: 19.1 % — ABNORMAL HIGH (ref 11.5–15.5)
WBC: 14.6 10*3/uL — ABNORMAL HIGH (ref 4.0–10.5)

## 2018-11-21 LAB — COMPREHENSIVE METABOLIC PANEL
ALK PHOS: 71 U/L (ref 38–126)
ALT: 25 U/L (ref 0–44)
AST: 18 U/L (ref 15–41)
Albumin: 3.1 g/dL — ABNORMAL LOW (ref 3.5–5.0)
Anion gap: 5 (ref 5–15)
BUN: 12 mg/dL (ref 6–20)
CO2: 26 mmol/L (ref 22–32)
Calcium: 8.6 mg/dL — ABNORMAL LOW (ref 8.9–10.3)
Chloride: 105 mmol/L (ref 98–111)
Creatinine, Ser: 0.6 mg/dL (ref 0.44–1.00)
GFR calc Af Amer: >60 mL/min (ref >60)
GFR calc non Af Amer: >60 mL/min (ref >60)
Glucose, Bld: 142 mg/dL — ABNORMAL HIGH (ref 70–99)
Potassium: 4.4 mmol/L (ref 3.5–5.1)
Sodium: 136 mmol/L (ref 135–145)
Total Bilirubin: 0.3 mg/dL (ref 0.3–1.2)
Total Protein: 7.3 g/dL (ref 6.5–8.1)

## 2018-11-21 LAB — HIV ANTIBODY (ROUTINE TESTING W REFLEX): HIV Screen 4th Generation wRfx: NONREACTIVE

## 2018-11-21 LAB — TSH: TSH: 0.47 u[IU]/mL (ref 0.350–4.500)

## 2018-11-21 MED ORDER — NICOTINE 21 MG/24HR TD PT24: 21.0000 mg | MEDICATED_PATCH | Freq: Every day | TRANSDERMAL | 0 refills | Status: DC

## 2018-11-21 MED ORDER — ALBUTEROL SULFATE (2.5 MG/3ML) 0.083% IN NEBU: 5.0000 mg | INHALATION_SOLUTION | RESPIRATORY_TRACT | 0 refills | Status: DC | PRN

## 2018-11-21 MED ORDER — PREDNISONE 20 MG PO TABS: 40.0000 mg | ORAL_TABLET | Freq: Every day | ORAL | 0 refills | Status: DC

## 2018-11-21 MED ORDER — IPRATROPIUM-ALBUTEROL 0.5-2.5 (3) MG/3ML IN SOLN: 3.0000 mL | Freq: Two times a day (BID) | RESPIRATORY_TRACT | Status: DC

## 2018-11-21 MED ORDER — IPRATROPIUM-ALBUTEROL 0.5-2.5 (3) MG/3ML IN SOLN
3.0000 mL | Freq: Three times a day (TID) | RESPIRATORY_TRACT | Status: DC
  Administered 2018-11-21: 3 mL via RESPIRATORY_TRACT
  Filled 2018-11-21: qty 3

## 2018-11-21 NOTE — Discharge Summary (Signed)
Physician Discharge Summary  April Ellis JKK:[id number] DOB: 05/22/1976 DOA: 11/20/2018  PCP: Sandrea Hughs, NP  Admit date: 11/20/2018 Discharge date: 11/21/2018  Admitted From: Home Disposition: Home   Recommendations for Outpatient Follow-up:  1. Follow up with PCP in 1-2 weeks 2. Smoking cessation counseling  Home Health: None Equipment/Devices: None new, has nebulizer Discharge Condition: Stable CODE STATUS: Full Diet recommendation: Heart healthy  Brief/Interim Summary: Per HPI: April Ellis is a 43 y.o. female with a past medical history of asthma, morbid obesity, tobacco abuse which is ongoing, history of iron deficiency anemia presented with complains of shortness of breath with wheezing starting Friday.  Patient also had a cough without any expectoration.  Some nausea but no vomiting.  No chest pain.  No fever.  No sick contacts.  No recent travel.  She took her rescue inhaler multiple times in the last 3 days without any improvement.  She has been compliant with her Advair.  Since her symptoms were not getting better she decided to come into the emergency department.  In the emergency department patient was given nebulizer treatments, steroids, magnesium.  Patient has improved.  She was ambulated however her oxygen saturation dropped into the mid 80s.  She felt short of breath.  She will need to be brought into the hospital for further management.   She was given steroids, breathing treatments and showed significant improvement the following day, ambulating with saturations 98-100% on room air, and was discharged after smoking cessation counseling provided.  Discharge Diagnoses:  Principal Problem:   Acute respiratory failure ([place]) Active Problems:   Morbid obesity ([facility])   Iron deficiency anemia   Asthma, chronic, unspecified asthma severity, with acute exacerbation  Acute respiratory failure with hypoxia: This is likely multifactorial. Suspected obesity, OHS,  asthma vs. chronic bronchitis and possibly OSA.  - Resolved hypoxia. Will discharge on prednisone, albuterol neb. - Consider pulmonology referral and/or sleep study.  Tobacco use: Cessation counseling provided in depth. - Nicotine patch prescribed at discharge, will follow up with PCP as she intends to stop (has for 2 years in the past)  Morbid obesity: BMI 64. TSH ok. - Continue outpatient management  Iron deficiency anemia due to menorrhagia: Hgb low but at baseline. UPT neg. - Continue iron supplementation.  Discharge Instructions Discharge Instructions    Diet - low sodium heart healthy   Complete by:  As directed    Discharge instructions   Complete by:  As directed    Continue attempts at not smoking. A nicotine patch has been sent to your pharmacy to help until you follow up with your doctor. This is the single most important thing to do to help your breathing not get worse over the short and long term.  - Take prednisone every morning until done with the course.  - A new prescription for albuterol neb was also sent to your pharmacy.  - If your symptoms return, seek medical attention right away.   Increase activity slowly   Complete by:  As directed      Allergies as of 11/21/2018      Reactions   Penicillins    Childhood Allergy Did it involve swelling of the face/tongue/throat, SOB, or low BP? UNK Did it involve sudden or severe rash/hives, skin peeling, or any reaction on the inside of your mouth or nose? UNK Did you need to seek medical attention at a hospital or doctor's office? UNK When did it last happen?more than 10 years If  all above answers are "NO", may proceed with cephalosporin use.      Medication List    STOP taking these medications   clindamycin 150 MG capsule Commonly known as:  CLEOCIN   ibuprofen 800 MG tablet Commonly known as:  ADVIL,MOTRIN     TAKE these medications   albuterol 108 (90 Base) MCG/ACT inhaler Commonly known as:   PROVENTIL HFA;VENTOLIN HFA Inhale 1-2 puffs into the lungs every 6 (six) hours as needed for wheezing or shortness of breath.   albuterol (2.5 MG/3ML) 0.083% nebulizer solution Commonly known as:  PROVENTIL Take 6 mLs (5 mg total) by nebulization every 4 (four) hours as needed for wheezing or shortness of breath.   ferrous sulfate 325 (65 FE) MG EC tablet Take 325 mg by mouth daily.   fluticasone-salmeterol 115-21 MCG/ACT inhaler Commonly known as:  ADVAIR HFA INHALE 1 TO 2 PUFFS EVERY 12 HOURS   montelukast 10 MG tablet Commonly known as:  SINGULAIR TAKE 1 TABLET (10 MG TOTAL) BY MOUTH AT BEDTIME.   nicotine 21 mg/24hr patch Commonly known as:  NICODERM CQ - dosed in mg/24 hours Place 1 patch (21 mg total) onto the skin daily. Start taking on:  November 22, 2018   predniSONE 20 MG tablet Commonly known as:  DELTASONE Take 2 tablets (40 mg total) by mouth daily.      Follow-up Information    Ngetich, Dinah C, NP. Go in 1 week(s).   Specialty:  Family Medicine Contact information: [place] [id number] [phone]          Allergies  Allergen Reactions  . Penicillins     Childhood Allergy Did it involve swelling of the face/tongue/throat, SOB, or low BP? UNK Did it involve sudden or severe rash/hives, skin peeling, or any reaction on the inside of your mouth or nose? UNK Did you need to seek medical attention at a hospital or doctor's office? UNK When did it last happen?more than 10 years If all above answers are "NO", may proceed with cephalosporin use.     Consultations: None  Procedures/Studies: Dg Chest 2 View  Result Date: 11/20/2018 CLINICAL DATA:  Shortness of breath.  History of asthma. EXAM: CHEST - 2 VIEW COMPARISON:  11/08/2018 FINDINGS: The heart is mildly enlarged but appears stable given the AP projection. The lungs are clear. No pleural effusions. The bony thorax is intact. IMPRESSION: No acute cardiopulmonary findings.  Stable borderline cardiac enlargement. Electronically Signed   By: Marijo Sanes M.D.   On: 11/20/2018 12:56   Dg Chest 2 View  Result Date: 11/08/2018 CLINICAL DATA:  Asthma with shortness of breath EXAM: CHEST - 2 VIEW COMPARISON:  12/31/2015 FINDINGS: Borderline heart size. Central airway cuffing. There is no edema, consolidation, effusion, or pneumothorax. No significant osseous finding. Borderline hyperinflation on the lateral view. IMPRESSION: 1. Airway thickening and borderline cardiomegaly. 2. Possible hyperinflation. 3. Stable from 2017. Electronically Signed   By: Monte Fantasia M.D.   On: 11/08/2018 06:13   Subjective: Feels well this morning, breathing back to baseline. No dyspnea on exertion, chest pain, palpitations, or wheezing.   Discharge Exam: Vitals:   11/21/18 0537 11/21/18 0854  BP: 138/73   Pulse: 69   Resp: 18   Temp: 98.2 F (36.8 C)   SpO2: 97% 93%   General: Pleasant obese female in no distress Cardiovascular: RRR, S1/S2 +, no rubs, no gallops Respiratory: Nonlabored and clear. Abdominal: Soft, NT, ND, bowel sounds + Extremities: No edema,  no cyanosis  Labs: BNP (last 3 results) No results for input(s): BNP in the last 8760 hours. Basic Metabolic Panel: Recent Labs  Lab 11/20/18 1436 11/21/18 0621  NA 139 136  K 3.9 4.4  CL 105 105  CO2 26 26  GLUCOSE 174* 142*  BUN 11 12  CREATININE 0.73 0.60  CALCIUM 8.6* 8.6*   Liver Function Tests: Recent Labs  Lab 11/21/18 0621  AST 18  ALT 25  ALKPHOS 71  BILITOT 0.3  PROT 7.3  ALBUMIN 3.1*   No results for input(s): LIPASE, AMYLASE in the last 168 hours. No results for input(s): AMMONIA in the last 168 hours. CBC: Recent Labs  Lab 11/20/18 1436 11/21/18 0621  WBC 14.5* 14.6*  NEUTROABS 13.7*  --   HGB 8.5* 8.1*  HCT 31.8* 31.5*  MCV 75.5* 75.5*  PLT 445* 445*   Cardiac Enzymes: Recent Labs  Lab 11/20/18 1436  TROPONINI <0.03   BNP: Invalid input(s): POCBNP CBG: No results  for input(s): GLUCAP in the last 168 hours. D-Dimer Recent Labs    11/20/18 1436  DDIMER 0.42   Hgb A1c No results for input(s): HGBA1C in the last 72 hours. Lipid Profile No results for input(s): CHOL, HDL, LDLCALC, TRIG, CHOLHDL, LDLDIRECT in the last 72 hours. Thyroid function studies Recent Labs    11/21/18 0621  TSH 0.470   Anemia work up No results for input(s): VITAMINB12, FOLATE, FERRITIN, TIBC, IRON, RETICCTPCT in the last 72 hours. Urinalysis    Component Value Date/Time   COLORURINE YELLOW 04/09/2014 2007   APPEARANCEUR CLEAR 04/09/2014 2007   LABSPEC 1.015 04/09/2014 2007   PHURINE 7.0 04/09/2014 2007   GLUCOSEU NEGATIVE 04/09/2014 2007   GLUCOSEU NEGATIVE 12/19/2008 1343   HGBUR NEGATIVE 04/09/2014 2007   BILIRUBINUR NEGATIVE 04/09/2014 2007   KETONESUR NEGATIVE 04/09/2014 2007   PROTEINUR NEGATIVE 04/09/2014 2007   UROBILINOGEN 1.0 04/09/2014 2007   NITRITE NEGATIVE 04/09/2014 2007   LEUKOCYTESUR NEGATIVE 04/09/2014 2007    Microbiology No results found for this or any previous visit (from the past 240 hour(s)).  Time coordinating discharge: Approximately 40 minutes  Patrecia Pour, MD  [facility] 11/21/2018, 4:00 PM Pager [phone]

## 2018-11-21 NOTE — Progress Notes (Signed)
SATURATION QUALIFICATIONS: (This note is used to comply with regulatory documentation for home oxygen)  Patient Saturations on Room Air at Rest = 98%  Patient Saturations on Room Air while Ambulating = 100%  Patient does not currently qualify for home oxygen use.

## 2018-11-21 NOTE — Care Management Note (Signed)
Case Management Note  Patient Details  Name: April Ellis MRN: [id number] Date of Birth: 1976/03/09  Subjective/Objective:                  discharged  Action/Plan: Discharged to home with self-care, orders checked for hhc needs. No CM needs present at time of discharge.  Patient is able to arrangement own appointments and home care.  Expected Discharge Date:  11/21/18               Expected Discharge Plan:  Home/Self Care  In-House Referral:     Discharge planning Services  CM Consult  Post Acute Care Choice:    Choice offered to:     DME Arranged:    DME Agency:     HH Arranged:    HH Agency:     Status of Service:     If discussed at [employer], dates discussed:    Additional Comments:  Leeroy Cha, RN 11/21/2018, 11:21 AM

## 2018-11-21 NOTE — Progress Notes (Signed)
April Ellis to be D/C'd Home per MD order.  Discussed prescriptions and follow up appointments with the patient. Prescriptions given to patient, medication list explained in detail. Pt verbalized understanding.    Vitals:   11/21/18 0537 11/21/18 0854  BP: 138/73   Pulse: 69   Resp: 18   Temp: 98.2 F (36.8 C)   SpO2: 97% 93%    Skin clean, dry and intact without evidence of skin break down, no evidence of skin tears noted. IV catheter discontinued intact. Site without signs and symptoms of complications. Dressing and pressure applied. Pt denies pain at this time. No complaints noted.  An After Visit Summary was printed and given to the patient. Patient escorted via [place], and D/C home via private auto.  [name] S 11/21/2018 10:50 AM

## 2018-11-28 ENCOUNTER — Ambulatory Visit (INDEPENDENT_AMBULATORY_CARE_PROVIDER_SITE_OTHER): Payer: 59 | Admitting: Family

## 2018-11-28 ENCOUNTER — Encounter: Payer: Self-pay | Admitting: Family

## 2018-11-28 VITALS — BP 128/80 | HR 86 | Temp 98.3°F | Ht 62.0 in | Wt 360.4 lb

## 2018-11-28 DIAGNOSIS — J449 Chronic obstructive pulmonary disease, unspecified: Secondary | ICD-10-CM | POA: Diagnosis not present

## 2018-11-28 DIAGNOSIS — J4540 Moderate persistent asthma, uncomplicated: Secondary | ICD-10-CM | POA: Diagnosis not present

## 2018-11-28 DIAGNOSIS — F17200 Nicotine dependence, unspecified, uncomplicated: Secondary | ICD-10-CM | POA: Diagnosis not present

## 2018-11-28 MED ORDER — VARENICLINE TARTRATE 0.5 MG PO TABS: ORAL_TABLET | ORAL | 0 refills | Status: DC

## 2018-11-28 MED ORDER — ALBUTEROL SULFATE (2.5 MG/3ML) 0.083% IN NEBU: 5.0000 mg | INHALATION_SOLUTION | RESPIRATORY_TRACT | 0 refills | Status: DC | PRN

## 2018-11-28 NOTE — Progress Notes (Signed)
Provider: Marlowe Sax FNP-C   Aaban Griep, Nelda Bucks, NP  Patient Care Team: Delorise Hunkele, Nelda Bucks, NP as PCP - General (Family Medicine)  Extended Emergency Contact Information Primary Emergency Contact: Hamm,Elaine Address: [address]          [place], [place] [address] Johnnette Litter of [place] Phone: [phone] Mobile Phone: [phone] Relation: Sister  Goals of care: Advanced Directive information Advanced Directives 11/20/2018  Does Patient Have a Medical Advance Directive? No  Would patient like information on creating a medical advance directive? No - Patient declined     Chief Complaint  Patient presents with  . Hospitalization Follow-up    Patient was hospitalized last monday for acute respiratory failure, states she is having left back pain,   . Medication Management    patient would like to explore alternatives to nicotine patches states she can not get patches to stay on      HPI:  Pt is a 43 y.o. female seen today for hospital admission follow up.she was admitted at [facility] 11/20/2018-11/21/2018 for asthma exacerbation.she presented to ED with shortness of breath,wheezing,non-productive cough and nausea without any vomiting.Her oxygen saturation dropped to mid 80's.Chest X-ray was negative for acute abnormalities.she was treated with Nebs,steroids and magnesium.Her oxygen saturation improved.she was discharge home on prednisone and Nicotine patch.also to continue on albuterol. Pulmonary referral/sleep study recommended.she states feeling much better.she has used Nicotine patch but states does not doing anything for her and patch keeps coming off.she continues to smoke.shw would like to try something else for smoking.of note she was in the ED 11/08/2018 with similar symptoms.   Past Medical History:  Diagnosis Date  . Anemia   . Asthma   . Bronchitis   . COPD (chronic obstructive pulmonary disease) ([place])   . Obesity    Past Surgical  History:  Procedure Laterality Date  . DENTAL SURGERY     2 teeth pulled, 11/22/14  . NO PAST SURGERIES      Allergies  Allergen Reactions  . Penicillins     Childhood Allergy Did it involve swelling of the face/tongue/throat, SOB, or low BP? UNK Did it involve sudden or severe rash/hives, skin peeling, or any reaction on the inside of your mouth or nose? UNK Did you need to seek medical attention at a hospital or doctor's office? UNK When did it last happen?more than 10 years If all above answers are "NO", may proceed with cephalosporin use.     Allergies as of 11/28/2018      Reactions   Penicillins    Childhood Allergy Did it involve swelling of the face/tongue/throat, SOB, or low BP? UNK Did it involve sudden or severe rash/hives, skin peeling, or any reaction on the inside of your mouth or nose? UNK Did you need to seek medical attention at a hospital or doctor's office? UNK When did it last happen?more than 10 years If all above answers are "NO", may proceed with cephalosporin use.      Medication List       Accurate as of November 28, 2018 11:03 AM. Always use your most recent med list.        albuterol 108 (90 Base) MCG/ACT inhaler Commonly known as:  PROVENTIL HFA;VENTOLIN HFA Inhale 1-2 puffs into the lungs every 6 (six) hours as needed for wheezing or shortness of breath.   albuterol (2.5 MG/3ML) 0.083% nebulizer solution Commonly known as:  PROVENTIL Take 6 mLs (5 mg total) by nebulization every 4 (four) hours  as needed for wheezing or shortness of breath.   ferrous sulfate 325 (65 FE) MG EC tablet Take 325 mg by mouth daily.   fluticasone-salmeterol 115-21 MCG/ACT inhaler Commonly known as:  ADVAIR HFA INHALE 1 TO 2 PUFFS EVERY 12 HOURS   montelukast 10 MG tablet Commonly known as:  SINGULAIR TAKE 1 TABLET (10 MG TOTAL) BY MOUTH AT BEDTIME.   nicotine 21 mg/24hr patch Commonly known as:  NICODERM CQ - dosed in mg/24 hours Place 1 patch (21  mg total) onto the skin daily.       Review of Systems  Constitutional: Negative for chills, fatigue and fever.  HENT: Negative for congestion, rhinorrhea, sinus pressure, sinus pain, sneezing and sore throat.   Eyes: Negative for pain, discharge, redness and itching.  Respiratory: Negative for cough, chest tightness, shortness of breath and wheezing.   Cardiovascular: Negative for chest pain, palpitations and leg swelling.  Gastrointestinal: Negative for abdominal distention, abdominal pain, constipation, diarrhea, nausea and vomiting.  Skin: Negative for color change, pallor and rash.  Neurological: Negative for dizziness, light-headedness and headaches.    Immunization History  Administered Date(s) Administered  . Td 12/19/2008   Pertinent  Health Maintenance Due  Topic Date Due  . INFLUENZA VACCINE  04/27/2018  . PAP SMEAR-Modifier  12/28/2019   Fall Risk  08/03/2017 04/19/2017 03/23/2017 07/14/2016 09/12/2015  Falls in the past year? No No No No No    Vitals:   11/28/18 1040  BP: 128/80  Pulse: 86  Temp: 98.3 F (36.8 C)  TempSrc: Oral  SpO2: 93%  Weight: (!) 360 lb 6.4 oz (163.5 kg)  Height: 5\' 2"  (1.575 m)   Body mass index is 65.92 kg/m. Physical Exam Constitutional:      General: She is not in acute distress.    Appearance: She is obese. She is not ill-appearing.  HENT:     Head: Normocephalic.     Mouth/Throat:     Mouth: Mucous membranes are moist.     Pharynx: Oropharynx is clear. No oropharyngeal exudate or posterior oropharyngeal erythema.  Eyes:     General: No scleral icterus.       Right eye: No discharge.     Conjunctiva/sclera: Conjunctivae normal.     Pupils: Pupils are equal, round, and reactive to light.  Neck:     Musculoskeletal: Normal range of motion. No neck rigidity or muscular tenderness.  Cardiovascular:     Rate and Rhythm: Normal rate and regular rhythm.     Pulses: Normal pulses.     Heart sounds: Normal heart sounds. No  murmur. No friction rub. No gallop.   Pulmonary:     Effort: Pulmonary effort is normal. No respiratory distress.     Breath sounds: Normal breath sounds. No wheezing, rhonchi or rales.  Chest:     Chest wall: No tenderness.  Abdominal:     General: Bowel sounds are normal. There is no distension.     Palpations: Abdomen is soft. There is no mass.     Tenderness: There is no abdominal tenderness. There is no right CVA tenderness, left CVA tenderness, guarding or rebound.  Musculoskeletal: Normal range of motion.        General: No swelling or tenderness.     Right lower leg: No edema.     Left lower leg: No edema.  Lymphadenopathy:     Cervical: No cervical adenopathy.  Skin:    General: Skin is dry.     Coloration: Skin  is not pale.     Findings: No erythema or rash.  Neurological:     Mental Status: She is alert and oriented to person, place, and time.     Cranial Nerves: No cranial nerve deficit.     Sensory: No sensory deficit.     Motor: No weakness.     Coordination: Coordination normal.     Gait: Gait normal.  Psychiatric:        Mood and Affect: Mood normal.        Behavior: Behavior normal.        Thought Content: Thought content normal.        Judgment: Judgment normal.    Labs reviewed: Recent Labs    11/08/18 0434 11/20/18 1436 11/21/18 0621  NA 135 139 136  K 4.1 3.9 4.4  CL 101 105 105  CO2 24 26 26   GLUCOSE 146* 174* 142*  BUN 8 11 12   CREATININE 0.70 0.73 0.60  CALCIUM 8.2* 8.6* 8.6*   Recent Labs    11/08/18 0434 11/21/18 0621  AST 20 18  ALT 23 25  ALKPHOS 88 71  BILITOT 0.6 0.3  PROT 8.5* 7.3  ALBUMIN 3.7 3.1*   Recent Labs    11/08/18 0434 11/20/18 1436 11/21/18 0621  WBC 11.8* 14.5* 14.6*  NEUTROABS 10.5* 13.7*  --   HGB 8.6* 8.5* 8.1*  HCT 31.1* 31.8* 31.5*  MCV 76.0* 75.5* 75.5*  PLT 375 445* 445*   Lab Results  Component Value Date   TSH 0.470 11/21/2018   Lab Results  Component Value Date   HGBA1C 5.0 07/14/2016    Lab Results  Component Value Date   CHOL 104 03/23/2017   HDL 63 03/23/2017   LDLCALC 29 03/23/2017   TRIG 58 03/23/2017   CHOLHDL 1.7 03/23/2017    Significant Diagnostic Results in last 30 days:  Dg Chest 2 View  Result Date: 11/20/2018 CLINICAL DATA:  Shortness of breath.  History of asthma. EXAM: CHEST - 2 VIEW COMPARISON:  11/08/2018 FINDINGS: The heart is mildly enlarged but appears stable given the AP projection. The lungs are clear. No pleural effusions. The bony thorax is intact. IMPRESSION: No acute cardiopulmonary findings. Stable borderline cardiac enlargement. Electronically Signed   By: Marijo Sanes M.D.   On: 11/20/2018 12:56   Dg Chest 2 View  Result Date: 11/08/2018 CLINICAL DATA:  Asthma with shortness of breath EXAM: CHEST - 2 VIEW COMPARISON:  12/31/2015 FINDINGS: Borderline heart size. Central airway cuffing. There is no edema, consolidation, effusion, or pneumothorax. No significant osseous finding. Borderline hyperinflation on the lateral view. IMPRESSION: 1. Airway thickening and borderline cardiomegaly. 2. Possible hyperinflation. 3. Stable from 2017. Electronically Signed   By: Monte Fantasia M.D.   On: 11/08/2018 06:13    Assessment/Plan 1. Moderate persistent asthma without complication Status post hospital admission with exacerbation.discharged with prednisone and Neb treatment.Shortness of breath and wheezing  Has improved.continue on Albuterol and Advair.     - Ambulatory referral to Pulmonology  2. Chronic obstructive pulmonary disease, unspecified COPD type ([place]) Breathing stable this visit.recommend smoking cessation.continue current medication.  - Ambulatory referral to Pulmonology  3. Smoking - Discontinue Nicotine patch per patient's request not working.   - varenicline (CHANTIX) 0.5 MG tablet; Take 0.5 mg tablet daily x 3 days then Take 0.5 mg tablet twice daily x 7 days then Take 1 mg tablet twice daily x 12 weeks  Dispense: 21 tablet;  Refill: 0 Will refill Chantix if still  smoking.   Family/ staff Communication: Reviewed plan of care with patient.   Labs/tests ordered: None   Kahne Helfand C Flay Ghosh, NP

## 2018-11-28 NOTE — Patient Instructions (Signed)
Smoking Tobacco Information, Adult Smoking tobacco can be harmful to your health. Tobacco contains a poisonous (toxic), colorless chemical called nicotine. Nicotine is addictive. It changes the brain and can make it hard to stop smoking. Tobacco also has other toxic chemicals that can hurt your body and raise your risk of many cancers. How can smoking tobacco affect me? Smoking tobacco puts you at risk for:  Cancer. Smoking is most commonly associated with lung cancer, but can also lead to cancer in other parts of the body.  Chronic obstructive pulmonary disease (COPD). This is a long-term lung condition that makes it hard to breathe. It also gets worse over time.  High blood pressure (hypertension), heart disease, stroke, or heart attack.  Lung infections, such as pneumonia.  Cataracts. This is when the lenses in the eyes become clouded.  Digestive problems. This may include peptic ulcers, heartburn, and gastroesophageal reflux disease (GERD).  Oral health problems, such as gum disease and tooth loss.  Loss of taste and smell. Smoking can affect your appearance by causing:  Wrinkles.  Yellow or stained teeth, fingers, and fingernails. Smoking tobacco can also affect your social life, because:  It may be challenging to find places to smoke when away from home. Many workplaces, restaurants, hotels, and public places are tobacco-free.  Smoking is expensive. This is due to the cost of tobacco and the long-term costs of treating health problems from smoking.  Secondhand smoke may affect those around you. Secondhand smoke can cause lung cancer, breathing problems, and heart disease. Children of smokers have a higher risk for: ? Sudden infant death syndrome (SIDS). ? Ear infections. ? Lung infections. If you currently smoke tobacco, quitting now can help you:  Lead a longer and healthier life.  Look, smell, breathe, and feel better over time.  Save money.  Protect others from the  harms of secondhand smoke. What actions can I take to prevent health problems? Quit smoking   Do not start smoking. Quit if you already do.  Make a plan to quit smoking and commit to it. Look for programs to help you and ask your health care provider for recommendations and ideas.  Set a date and write down all the reasons you want to quit.  Let your friends and family know you are quitting so they can help and support you. Consider finding friends who also want to quit. It can be easier to quit with someone else, so that you can support each other.  Talk with your health care provider about using nicotine replacement medicines to help you quit, such as gum, lozenges, patches, sprays, or pills.  Do not replace cigarette smoking with electronic cigarettes, which are commonly called e-cigarettes. The safety of e-cigarettes is not known, and some may contain harmful chemicals.  If you try to quit but return to smoking, stay positive. It is common to slip up when you first quit, so take it one day at a time.  Be prepared for cravings. When you feel the urge to smoke, chew gum or suck on hard candy. Lifestyle  Stay busy and take care of your body.  Drink enough fluid to keep your urine pale yellow.  Get plenty of exercise and eat a healthy diet. This can help prevent weight gain after quitting.  Monitor your eating habits. Quitting smoking can cause you to have a larger appetite than when you smoke.  Find ways to relax. Go out with friends or family to a movie or a restaurant   where people do not smoke.  Ask your health care provider about having regular tests (screenings) to check for cancer. This may include blood tests, imaging tests, and other tests.  Find ways to manage your stress, such as meditation, yoga, or exercise. Where to find support To get support to quit smoking, consider:  Asking your health care provider for more information and resources.  Taking classes to learn  more about quitting smoking.  Looking for local organizations that offer resources about quitting smoking.  Joining a support group for people who want to quit smoking in your local community.  Calling the smokefree.gov counselor helpline: 1-800-Quit-Now 351 573 8026) Where to find more information You may find more information about quitting smoking from:  HelpGuide.org: www.helpguide.org  https://hall.com/: smokefree.gov  American Lung Association: www.lung.org Contact a health care provider if you:  Have problems breathing.  Notice that your lips, nose, or fingers turn blue.  Have chest pain.  Are coughing up blood.  Feel faint or you pass out.  Have other health changes that cause you to worry. Summary  Smoking tobacco can negatively affect your health, the health of those around you, your finances, and your social life.  Do not start smoking. Quit if you already do. If you need help quitting, ask your health care provider.  Think about joining a support group for people who want to quit smoking in your local community. There are many effective programs that will help you to quit this behavior. This information is not intended to replace advice given to you by your health care provider. Make sure you discuss any questions you have with your health care provider. Document Released: 09/28/2016 Document Revised: 11/02/2017 Document Reviewed: 09/28/2016 Elsevier Interactive Patient Education  2019 [employer]. Varenicline oral tablets What is this medicine? VARENICLINE (var EN i kleen) is used to help people quit smoking. It is used with a patient support program recommended by your physician. This medicine may be used for other purposes; ask your health care provider or pharmacist if you have questions. COMMON BRAND NAME(S): Chantix What should I tell my health care provider before I take this medicine? They need to know if you have any of these conditions: -heart  disease -if you often drink alcohol -kidney disease -mental illness -on hemodialysis -seizures -history of stroke -suicidal thoughts, plans, or attempt; a previous suicide attempt by you or a family member -an unusual or allergic reaction to varenicline, other medicines, foods, dyes, or preservatives -pregnant or trying to get pregnant -breast-feeding How should I use this medicine? Take this medicine by mouth after eating. Take with a full glass of water. Follow the directions on the prescription label. Take your doses at regular intervals. Do not take your medicine more often than directed. There are 3 ways you can use this medicine to help you quit smoking; talk to your health care professional to decide which plan is right for you: 1) you can choose a quit date and start this medicine 1 week before the quit date, or, 2) you can start taking this medicine before you choose a quit date, and then pick a quit date between day 8 and 35 days of treatment, or, 3) if you are not sure that you are able or willing to quit smoking right away, start taking this medicine and slowly decrease the amount you smoke as directed by your health care professional with the goal of being cigarette-free by week 12 of treatment. Stick to your plan; ask about support  groups or other ways to help you remain cigarette-free. If you are motivated to quit smoking and did not succeed during a previous attempt with this medicine for reasons other than side effects, or if you returned to smoking after this treatment, speak with your health care professional about whether another course of this medicine may be right for you. A special MedGuide will be given to you by the pharmacist with each prescription and refill. Be sure to read this information carefully each time. Talk to your pediatrician regarding the use of this medicine in children. This medicine is not approved for use in children. Overdosage: If you think you have  taken too much of this medicine contact a poison control center or emergency room at once. NOTE: This medicine is only for you. Do not share this medicine with others. What if I miss a dose? If you miss a dose, take it as soon as you can. If it is almost time for your next dose, take only that dose. Do not take double or extra doses. What may interact with this medicine? -alcohol -insulin -other medicines used to help people quit smoking -theophylline -warfarin This list may not describe all possible interactions. Give your health care provider a list of all the medicines, herbs, non-prescription drugs, or dietary supplements you use. Also tell them if you smoke, drink alcohol, or use illegal drugs. Some items may interact with your medicine. What should I watch for while using this medicine? It is okay if you do not succeed at your attempt to quit and have a cigarette. You can still continue your quit attempt and keep using this medicine as directed. Just throw away your cigarettes and get back to your quit plan. Talk to your health care provider before using other treatments to quit smoking. Using this medicine with other treatments to quit smoking may increase the risk for side effects compared to using a treatment alone. You may get drowsy or dizzy. Do not drive, use machinery, or do anything that needs mental alertness until you know how this medicine affects you. Do not stand or sit up quickly, especially if you are an older patient. This reduces the risk of dizzy or fainting spells. Decrease the number of alcoholic beverages that you drink during treatment with this medicine until you know if this medicine affects your ability to tolerate alcohol. Some people have experienced increased drunkenness (intoxication), unusual or sometimes aggressive behavior, or no memory of things that have happened (amnesia) during treatment with this medicine. Sleepwalking can happen during treatment with this  medicine, and can sometimes lead to behavior that is harmful to you, other people, or property. Stop taking this medicine and tell your doctor if you start sleepwalking or have other unusual sleep-related activity. After taking this medicine, you may get up out of bed and do an activity that you do not know you are doing. The next morning, you may have no memory of this. Activities include driving a car ("sleep-driving"), making and eating food, talking on the phone, sexual activity, and sleep-walking. Serious injuries have occurred. Stop the medicine and call your doctor right away if you find out you have done any of these activities. Do not take this medicine if you have used alcohol that evening. Do not take it if you have taken another medicine for sleep. The risk of doing these sleep-related activities is higher. Patients and their families should watch out for new or worsening depression or thoughts of suicide. Also  watch out for sudden changes in feelings such as feeling anxious, agitated, panicky, irritable, hostile, aggressive, impulsive, severely restless, overly excited and hyperactive, or not being able to sleep. If this happens, call your health care professional. If you have diabetes and you quit smoking, the effects of insulin may be increased and you may need to reduce your insulin dose. Check with your doctor or health care professional about how you should adjust your insulin dose. What side effects may I notice from receiving this medicine? Side effects that you should report to your doctor or health care professional as soon as possible: -allergic reactions like skin rash, itching or hives, swelling of the face, lips, tongue, or throat -acting aggressive, being angry or violent, or acting on dangerous impulses -breathing problems -changes in emotions or moods -chest pain or chest tightness -feeling faint or lightheaded, falls -hallucination, loss of contact with reality -mouth  sores -redness, blistering, peeling or loosening of the skin, including inside the mouth -signs and symptoms of a stroke like changes in vision; confusion; trouble speaking or understanding; severe headaches; sudden numbness or weakness of the face, arm or leg; trouble walking; dizziness; loss of balance or coordination -seizures -sleepwalking -suicidal thoughts or other mood changes Side effects that usually do not require medical attention (report to your doctor or health care professional if they continue or are bothersome): -constipation -gas -headache -nausea, vomiting -strange dreams -trouble sleeping This list may not describe all possible side effects. Call your doctor for medical advice about side effects. You may report side effects to FDA at 1-800-FDA-1088. Where should I keep my medicine? Keep out of the reach of children. Store at room temperature between 15 and 30 degrees C (59 and 86 degrees F). Throw away any unused medicine after the expiration date. NOTE: This sheet is a summary. It may not cover all possible information. If you have questions about this medicine, talk to your doctor, pharmacist, or health care provider.  2019 Elsevier/Gold Standard (2018-03-10 12:48:08)

## 2018-11-29 NOTE — Addendum Note (Signed)
Addended byMarlowe Sax C on: 11/29/2018 04:46 PM   Modules accepted: Level of Service

## 2018-12-25 ENCOUNTER — Telehealth: Payer: Self-pay

## 2018-12-25 NOTE — Telephone Encounter (Signed)
Patient needs to be seen to rule out pneumonia.

## 2018-12-25 NOTE — Telephone Encounter (Signed)
Left message on voicemail informing patient to seek medical attention if symptoms progress prior to appointment.

## 2018-12-25 NOTE — Telephone Encounter (Signed)
Exposure history: In the past 14 days, have you traveled at all?  No  In the past 14 days, have you been in close-contact with a laboratory-confirmed Covid-19 patient?  No  Symptoms: Do you have any symptoms of fever subjectively or measured?  No  If it was measured, what was the temperature?  No, denies chills or sweats   Any cough?  Not anything unsual, patient with usual cough (history of allergies and asthma)   Any shortness of breath?  Yes (history of asthma and allergies)   Any difficulty breathing?  Not extreme, using nebulizer and rescue inhaler. Patient is using inhaler more than every 6 hours as needed, this is what prompted the call. Patient is trying to avoid going to the ER. Patient states she was in the ER x 2 in February for the same symptoms and was recently seen at Urgent Care. Patient states she usually gets a round of prednisone and it clears this up. 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   Patient would like a detailed message left on VM if no answer when returning her call.    Please remain at home isolated from others and practice good personal hygiene until you hear back from [place] with further instructions.  This message will be sent to your PCP who will review the responses to the questions and determine next steps (quarantine, telephone visit, office visit or ED referral).

## 2018-12-25 NOTE — Telephone Encounter (Signed)
Left message on voicemail for patient to return call when available . Reason for call- offer 1:00 pm appointment with Sherrie Mustache, NP

## 2018-12-26 ENCOUNTER — Telehealth: Payer: Self-pay

## 2018-12-26 ENCOUNTER — Other Ambulatory Visit: Payer: Self-pay

## 2018-12-26 ENCOUNTER — Ambulatory Visit: Payer: 59 | Admitting: Nurse Practitioner

## 2018-12-26 ENCOUNTER — Ambulatory Visit (INDEPENDENT_AMBULATORY_CARE_PROVIDER_SITE_OTHER): Payer: 59 | Admitting: Nurse Practitioner

## 2018-12-26 ENCOUNTER — Encounter: Payer: Self-pay | Admitting: Nurse Practitioner

## 2018-12-26 VITALS — BP 110/80 | HR 108 | Temp 98.3°F | Ht 62.0 in | Wt 362.8 lb

## 2018-12-26 DIAGNOSIS — J45909 Unspecified asthma, uncomplicated: Secondary | ICD-10-CM

## 2018-12-26 MED ORDER — FLUTICASONE-SALMETEROL 230-21 MCG/ACT IN AERO: 2.0000 | INHALATION_SPRAY | Freq: Two times a day (BID) | RESPIRATORY_TRACT | 3 refills | Status: DC

## 2018-12-26 MED ORDER — FLUTICASONE PROPIONATE 50 MCG/ACT NA SUSP: 2.0000 | Freq: Every day | NASAL | 6 refills | Status: DC

## 2018-12-26 NOTE — Progress Notes (Signed)
Careteam: Patient Care Team: Ngetich, Nelda Bucks, NP as PCP - General (Family Medicine)  Advanced Directive information    Allergies  Allergen Reactions  . Penicillins     Childhood Allergy Did it involve swelling of the face/tongue/throat, SOB, or low BP? UNK Did it involve sudden or severe rash/hives, skin peeling, or any reaction on the inside of your mouth or nose? UNK Did you need to seek medical attention at a hospital or doctor's office? UNK When did it last happen?more than 10 years If all above answers are "NO", may proceed with cephalosporin use.     Chief Complaint  Patient presents with  . Acute Visit    Asthma Flare Up     HPI: Patient is a 43 y.o. female seen in the office today  Pt with hx of asthma. Feeling tight the last couple of days. She has already gone to hospital twice due to asthma exacerbation.  Feb 2020 was the first time in about 3 years she has had to be hospitalized.  No shortness of breath but using inhaler more often. When she is moving around more she needs albuterol more often.  Using albuterol 5-6 times a day for 2-3 days.  Reports she is wheezing more with pollen.   Taking singular 10 mg at bedtime Using advair 115-21 2 puffs twice daily.   Has a prescription for chantix and using this to cut back on smoking. Still smoking.   No fever or chills.  No cough but will clear out phlegm in her throat.  No sore throat Reports increase mucous in her throat and nose- contributes to allergies.    Review of Systems:  Review of Systems  Constitutional: Negative for chills, fever and malaise/fatigue.  HENT: Positive for congestion. Negative for ear discharge, ear pain, nosebleeds, sinus pain and sore throat.   Respiratory: Positive for shortness of breath and wheezing. Negative for cough.   Neurological: Negative for dizziness and headaches.    Past Medical History:  Diagnosis Date  . Anemia   . Asthma   . Bronchitis   . COPD  (chronic obstructive pulmonary disease) ([place])   . Obesity    Past Surgical History:  Procedure Laterality Date  . DENTAL SURGERY     2 teeth pulled, 11/22/14  . NO PAST SURGERIES     Social History:   reports that she has been smoking cigarettes. She has a 3.75 pack-year smoking history. She has never used smokeless tobacco. She reports that she does not drink alcohol or use drugs.  Family History  Problem Relation Age of Onset  . Diabetes Mother   . Cancer Mother   . Diabetes Sister   . Hypertension Sister   . Hypertension Sister   . Urticaria Neg Hx   . Immunodeficiency Neg Hx   . Eczema Neg Hx   . Asthma Neg Hx   . Angioedema Neg Hx   . Allergic rhinitis Neg Hx     Medications: Patient's Medications  New Prescriptions   No medications on file  Previous Medications   ALBUTEROL (PROVENTIL HFA;VENTOLIN HFA) 108 (90 BASE) MCG/ACT INHALER    Inhale 1-2 puffs into the lungs every 6 (six) hours as needed for wheezing or shortness of breath.   ALBUTEROL (PROVENTIL) (2.5 MG/3ML) 0.083% NEBULIZER SOLUTION    Take 6 mLs (5 mg total) by nebulization every 4 (four) hours as needed for wheezing or shortness of breath.   FERROUS SULFATE 325 (65 FE) MG EC  TABLET    Take 325 mg by mouth daily.   FLUTICASONE-SALMETEROL (ADVAIR HFA) 115-21 MCG/ACT INHALER    INHALE 1 TO 2 PUFFS EVERY 12 HOURS   MONTELUKAST (SINGULAIR) 10 MG TABLET    TAKE 1 TABLET (10 MG TOTAL) BY MOUTH AT BEDTIME.   VARENICLINE (CHANTIX) 0.5 MG TABLET    Take 0.5 mg tablet daily x 3 days then Take 0.5 mg tablet twice daily x 7 days then Take 1 mg tablet twice daily x 12 weeks  Modified Medications   No medications on file  Discontinued Medications   No medications on file     Physical Exam:  Vitals:   12/26/18 1520  BP: 110/80  Pulse: (!) 108  Temp: 98.3 F (36.8 C)  TempSrc: Oral  SpO2: 98%  Weight: (!) 362 lb 12.8 oz (164.6 kg)  Height: 5\' 2"  (1.575 m)   Body mass index is 66.36 kg/m.  Physical Exam  Constitutional:      General: She is not in acute distress.    Appearance: She is obese. She is not ill-appearing.  HENT:     Head: Normocephalic.     Nose: Nose normal.     Mouth/Throat:     Mouth: Mucous membranes are moist.     Pharynx: Oropharynx is clear. No oropharyngeal exudate or posterior oropharyngeal erythema.  Eyes:     General: No scleral icterus.       Right eye: No discharge.     Conjunctiva/sclera: Conjunctivae normal.     Pupils: Pupils are equal, round, and reactive to light.  Neck:     Musculoskeletal: Normal range of motion. No neck rigidity or muscular tenderness.  Cardiovascular:     Rate and Rhythm: Normal rate and regular rhythm.     Pulses: Normal pulses.     Heart sounds: Normal heart sounds. No murmur. No friction rub. No gallop.   Pulmonary:     Effort: Pulmonary effort is normal. No respiratory distress.     Breath sounds: Normal breath sounds. No wheezing, rhonchi or rales.  Chest:     Chest wall: No tenderness.  Lymphadenopathy:     Cervical: No cervical adenopathy.  Skin:    General: Skin is dry.  Neurological:     Mental Status: She is alert and oriented to person, place, and time.  Psychiatric:        Mood and Affect: Mood normal.        Behavior: Behavior normal.        Thought Content: Thought content normal.        Judgment: Judgment normal.     Labs reviewed: Basic Metabolic Panel: Recent Labs    11/08/18 0434 11/20/18 1436 11/21/18 0621  NA 135 139 136  K 4.1 3.9 4.4  CL 101 105 105  CO2 24 26 26   GLUCOSE 146* 174* 142*  BUN 8 11 12   CREATININE 0.70 0.73 0.60  CALCIUM 8.2* 8.6* 8.6*  TSH  --   --  0.470   Liver Function Tests: Recent Labs    11/08/18 0434 11/21/18 0621  AST 20 18  ALT 23 25  ALKPHOS 88 71  BILITOT 0.6 0.3  PROT 8.5* 7.3  ALBUMIN 3.7 3.1*   No results for input(s): LIPASE, AMYLASE in the last 8760 hours. No results for input(s): AMMONIA in the last 8760 hours. CBC: Recent Labs    11/08/18  0434 11/20/18 1436 11/21/18 0621  WBC 11.8* 14.5* 14.6*  NEUTROABS 10.5* 13.7*  --  HGB 8.6* 8.5* 8.1*  HCT 31.1* 31.8* 31.5*  MCV 76.0* 75.5* 75.5*  PLT 375 445* 445*   Lipid Panel: No results for input(s): CHOL, HDL, LDLCALC, TRIG, CHOLHDL, LDLDIRECT in the last 8760 hours. TSH: Recent Labs    11/21/18 0621  TSH 0.470   A1C: Lab Results  Component Value Date   HGBA1C 5.0 07/14/2016     Assessment/Plan 1. Chronic asthma without complication, unspecified asthma severity, unspecified whether persistent Without wheezing at this time. -continue albuterol neb every 6 hours as needed -will increase advair at this time. - fluticasone-salmeterol (ADVAIR HFA) 230-21 MCG/ACT inhaler; Inhale 2 puffs into the lungs 2 (two) times daily.  Dispense: 12 g; Refill: 3 - add fluticasone (FLONASE) 50 MCG/ACT nasal spray; Place 2 sprays into both nostrils daily.  Dispense: 16 g; Refill: 6 -to use mucinex by mouth twice daily with full glass of water -continues on singulair at bedtime Add zyrtec 10 mg by mouth daily  -smoking cessation discussed To notify if symptoms worsen or fail to improve Nylan Nevel K. Woodridge, [facility] [phone]

## 2018-12-26 NOTE — Patient Instructions (Addendum)
New Rx sent to pharmacy for Advair To start zyrtec 10 mg by mouth daily  To use flonase 2 spray into both nares daily  To notify if symptoms worsen or fail to improve

## 2018-12-26 NOTE — Telephone Encounter (Signed)
Left message on voicemail for patient to return call when available, reason for call:  Patient was seen in office this afternoon and April Ellis has some additional instructions: Per April Ellis patient to take plain mucinex twice daily with a full glass of water (instructions left on VM)

## 2018-12-27 NOTE — Telephone Encounter (Signed)
Left message on voicemail for patient to return call when available   

## 2019-01-01 ENCOUNTER — Telehealth: Payer: Self-pay | Admitting: *Deleted

## 2019-01-01 NOTE — Telephone Encounter (Signed)
Patient called and left message on Clinical Intake stating that She was seen Last Tuesday by Janett Billow and given 2 things to try for her Asthma. Patient is still not comfortable with her Breathing and still has SOB.   Tried calling patient back and LMOM to Return call.

## 2019-01-01 NOTE — Telephone Encounter (Signed)
Encounter closed due to several unsuccessful attempts to reach patient by phone

## 2019-01-02 ENCOUNTER — Ambulatory Visit (INDEPENDENT_AMBULATORY_CARE_PROVIDER_SITE_OTHER): Payer: 59 | Admitting: Nurse Practitioner

## 2019-01-02 ENCOUNTER — Other Ambulatory Visit: Payer: Self-pay

## 2019-01-02 ENCOUNTER — Encounter: Payer: Self-pay | Admitting: Nurse Practitioner

## 2019-01-02 DIAGNOSIS — F17200 Nicotine dependence, unspecified, uncomplicated: Secondary | ICD-10-CM

## 2019-01-02 DIAGNOSIS — J310 Chronic rhinitis: Secondary | ICD-10-CM

## 2019-01-02 DIAGNOSIS — IMO0001 Reserved for inherently not codable concepts without codable children: Secondary | ICD-10-CM

## 2019-01-02 DIAGNOSIS — J45909 Unspecified asthma, uncomplicated: Secondary | ICD-10-CM | POA: Diagnosis not present

## 2019-01-02 MED ORDER — PREDNISONE 10 MG (21) PO TBPK: ORAL_TABLET | ORAL | 0 refills | Status: DC

## 2019-01-02 NOTE — Telephone Encounter (Signed)
Tried calling patient to schedule TeleVisit and had to [facility] to return call.   April Ellis is confirming referral.

## 2019-01-02 NOTE — Telephone Encounter (Signed)
Lets do a tele-visit with her today. Also where is her pulmonary referral at? We def need to get her in with them asap for spirometry and OSA

## 2019-01-02 NOTE — Patient Instructions (Signed)
Start prednisone taper.  Continue mucinex increase to twice daily with full glass of water.

## 2019-01-02 NOTE — Telephone Encounter (Signed)
Patient returned call. Scheduled Televisit for 3:15. Patient stated that she works from home and this is the time she will be on her lunch to take the call.

## 2019-01-02 NOTE — Progress Notes (Signed)
This service is provided via telemedicine  No vital signs collected/recorded due to the encounter was a telemedicine visit.   Location of patient (ex: home, work): Home   Patient consents to a telephone visit: Yes  Location of the provider (ex: office, home): [employer], Office   Name of any referring provider:  Ngetich, Nelda Bucks, NP  Names of all persons participating in the telemedicine service and their role in the encounter: S.Chrae B/CMA, Sherrie Mustache, NP, and Patient    Time spent on call: 4 min    Virtual Visit via Telephone Note  I connected with April Ellis on 01/02/19 at  3:15 PM EDT by telephone and verified that I am speaking with the correct person using two identifiers.   I discussed the limitations, risks, security and privacy concerns of performing an evaluation and management service by telephone and the availability of in person appointments. I also discussed with the patient that there may be a patient responsible charge related to this service. The patient expressed understanding and agreed to proceed.      Careteam: Patient Care Team: Ngetich, Nelda Bucks, NP as PCP - General (Family Medicine)  Advanced Directive information    Allergies  Allergen Reactions  . Penicillins     Childhood Allergy Did it involve swelling of the face/tongue/throat, SOB, or low BP? UNK Did it involve sudden or severe rash/hives, skin peeling, or any reaction on the inside of your mouth or nose? UNK Did you need to seek medical attention at a hospital or doctor's office? UNK When did it last happen?more than 10 years If all above answers are "NO", may proceed with cephalosporin use.     Chief Complaint  Patient presents with  . Acute Visit    Ongoing breathing issues      HPI: Patient is a 43 y.o. female to follow up via tele-visit. She was seen in office last week due to cough, congestion and shortness of breath. advair was increase, mucinex added.   Reports she has quit smoking, increased advair and using albuterol  but too tight for comfort.  Reports generally when she is feeling tight it progressively gets worse.  States she is wheezing in the evening it is worse.  Feels a tickle feeling.  Taking albuterol frequently, 4-5 times daily Working from home now. Gets short of breath easily when moving around the house and needing albuterol.  Using advair 230-21 twice daily with mucinex daily  Pulmonary referral pending. She has not been worked up for OSA. She was referred but never went.   She has tapered off her chantix since she has quit smoking but would like it to stay on her medication list in case she decides to restart   Congestion, watery eyes, runny nose has improved, using Flonase with good results.   Review of Systems:  Review of Systems  Constitutional: Negative for chills, fever and weight loss.  HENT: Negative for congestion, hearing loss and sore throat.   Respiratory: Positive for cough, sputum production, shortness of breath and wheezing.   Cardiovascular: Negative for chest pain and leg swelling.  Gastrointestinal: Negative for heartburn.  Neurological: Negative for dizziness and headaches.   Past Medical History:  Diagnosis Date  . Anemia   . Asthma   . Bronchitis   . COPD (chronic obstructive pulmonary disease) ([place])   . Obesity    Past Surgical History:  Procedure Laterality Date  . DENTAL SURGERY     2 teeth  pulled, 11/22/14  . NO PAST SURGERIES     Social History:   reports that she has been smoking cigarettes. She has a 3.75 pack-year smoking history. She has never used smokeless tobacco. She reports that she does not drink alcohol or use drugs.  Family History  Problem Relation Age of Onset  . Diabetes Mother   . Cancer Mother   . Diabetes Sister   . Hypertension Sister   . Hypertension Sister   . Urticaria Neg Hx   . Immunodeficiency Neg Hx   . Eczema Neg Hx   . Asthma Neg Hx   .  Angioedema Neg Hx   . Allergic rhinitis Neg Hx     Medications: Patient's Medications  New Prescriptions   No medications on file  Previous Medications   ALBUTEROL (PROVENTIL HFA;VENTOLIN HFA) 108 (90 BASE) MCG/ACT INHALER    Inhale 1-2 puffs into the lungs every 6 (six) hours as needed for wheezing or shortness of breath.   ALBUTEROL (PROVENTIL) (2.5 MG/3ML) 0.083% NEBULIZER SOLUTION    Take 6 mLs (5 mg total) by nebulization every 4 (four) hours as needed for wheezing or shortness of breath.   FERROUS SULFATE 325 (65 FE) MG EC TABLET    Take 325 mg by mouth daily.   FLUTICASONE (FLONASE) 50 MCG/ACT NASAL SPRAY    Place 2 sprays into both nostrils daily.   FLUTICASONE-SALMETEROL (ADVAIR HFA) 230-21 MCG/ACT INHALER    Inhale 2 puffs into the lungs 2 (two) times daily.   GUAIFENESIN (MUCINEX) 600 MG 12 HR TABLET    Take 600 mg by mouth daily.   MONTELUKAST (SINGULAIR) 10 MG TABLET    TAKE 1 TABLET (10 MG TOTAL) BY MOUTH AT BEDTIME.   VARENICLINE (CHANTIX) 0.5 MG TABLET    Take 0.5 mg tablet daily x 3 days then Take 0.5 mg tablet twice daily x 7 days then Take 1 mg tablet twice daily x 12 weeks  Modified Medications   No medications on file  Discontinued Medications   No medications on file     Physical Exam: unable due to tele-visit.   Labs reviewed: Basic Metabolic Panel: Recent Labs    11/08/18 0434 11/20/18 1436 11/21/18 0621  NA 135 139 136  K 4.1 3.9 4.4  CL 101 105 105  CO2 24 26 26   GLUCOSE 146* 174* 142*  BUN 8 11 12   CREATININE 0.70 0.73 0.60  CALCIUM 8.2* 8.6* 8.6*  TSH  --   --  0.470   Liver Function Tests: Recent Labs    11/08/18 0434 11/21/18 0621  AST 20 18  ALT 23 25  ALKPHOS 88 71  BILITOT 0.6 0.3  PROT 8.5* 7.3  ALBUMIN 3.7 3.1*   No results for input(s): LIPASE, AMYLASE in the last 8760 hours. No results for input(s): AMMONIA in the last 8760 hours. CBC: Recent Labs    11/08/18 0434 11/20/18 1436 11/21/18 0621  WBC 11.8* 14.5* 14.6*   NEUTROABS 10.5* 13.7*  --   HGB 8.6* 8.5* 8.1*  HCT 31.1* 31.8* 31.5*  MCV 76.0* 75.5* 75.5*  PLT 375 445* 445*   Lipid Panel: No results for input(s): CHOL, HDL, LDLCALC, TRIG, CHOLHDL, LDLDIRECT in the last 8760 hours. TSH: Recent Labs    11/21/18 0621  TSH 0.470   A1C: Lab Results  Component Value Date   HGBA1C 5.0 07/14/2016     Assessment/Plan 1. Chronic asthma without complication, unspecified asthma severity, unspecified whether persistent -continue advair twice daily, albuterol  as needed, mucinex twice daily -will add prednisone taper -pulmonary referral has been placed, called to see the status of this order, she needs to be evaluated due to uncontrolled symptoms and possible OSA.  - predniSONE (STERAPRED UNI-PAK 21 TAB) 10 MG (21) TBPK tablet; Use as directed  Dispense: 21 tablet; Refill: 0  2. Morbid obesity ([place]) -encouraged weight loss as this can contribute to shortness of breath.   3. Chronic rhinitis Stable, flonase has been beneficial.   4. Smoking Reports she has currently stopped smoking and not taking chantix but would like to keep on medication list in case she needs to restart. Encouraged to continue cessation.  Next appt: 2 months  Mayukha Symmonds K. Harle Battiest  [facility] [phone]    Follow Up Instructions:    I discussed the assessment and treatment plan with the patient. The patient was provided an opportunity to ask questions and all were answered. The patient agreed with the plan and demonstrated an understanding of the instructions.   The patient was advised to call back or seek an in-person evaluation if the symptoms worsen or if the condition fails to improve as anticipated.  I provided 11 minutes of non-face-to-face time during this encounter.   Lauree Chandler, NP

## 2019-01-12 ENCOUNTER — Encounter: Payer: Self-pay | Admitting: Internal Medicine

## 2019-01-12 ENCOUNTER — Other Ambulatory Visit: Payer: Self-pay

## 2019-01-12 ENCOUNTER — Ambulatory Visit (INDEPENDENT_AMBULATORY_CARE_PROVIDER_SITE_OTHER): Payer: 59 | Admitting: Internal Medicine

## 2019-01-12 VITALS — BP 136/72 | HR 102 | Ht 62.0 in | Wt 362.8 lb

## 2019-01-12 DIAGNOSIS — Z720 Tobacco use: Secondary | ICD-10-CM

## 2019-01-12 DIAGNOSIS — J4540 Moderate persistent asthma, uncomplicated: Secondary | ICD-10-CM | POA: Diagnosis not present

## 2019-01-12 MED ORDER — ALBUTEROL SULFATE HFA 108 (90 BASE) MCG/ACT IN AERS: 1.0000 | INHALATION_SPRAY | Freq: Four times a day (QID) | RESPIRATORY_TRACT | 12 refills | Status: DC | PRN

## 2019-01-12 MED ORDER — ALBUTEROL SULFATE (2.5 MG/3ML) 0.083% IN NEBU: 5.0000 mg | INHALATION_SOLUTION | RESPIRATORY_TRACT | 12 refills | Status: DC | PRN

## 2019-01-12 MED ORDER — METHYLPREDNISOLONE ACETATE 80 MG/ML IJ SUSP
80.0000 mg | Freq: Once | INTRAMUSCULAR | Status: AC
  Administered 2019-01-12: 80 mg via INTRAMUSCULAR

## 2019-01-12 NOTE — Assessment & Plan Note (Signed)
Crucial that this stop completely. Discussed Chantix

## 2019-01-12 NOTE — Assessment & Plan Note (Signed)
Significant problem. She may benefit from Bariatric referal for counseling.

## 2019-01-12 NOTE — Progress Notes (Signed)
01/12/2019- 42yoF  smoker  referred by Marlowe Sax, NP for asthma/COPD, having wheezing, SHOB with exertion Medical problem list includes Asthma, COPD, Chronic rhinitis, Urticaria, morbid obesity, Iron def Anemia Eval for OSA by Dr Gwenette Greet in 2015. No sleep study done. Minimizes snoring and somnolence. Office spirometry 2017 by [place] office- mild obstruction PFT 05/01/14- Severe obstruction. FVC 2.47/ 84%, FEV1 1.46/ 59%, ratio 0.59, FEF25-75% 0.81/ 28%, TLC 98%, DLCO 93%. CXR 11/20/2018- No acute cardiopulmonary findings. Stable borderline cardiac enlargement.  Had quit smoking for a few years but resumed. Also using Chantix. Advair 230 hfa, Singulair,Neb albuterol, albuterol hfa,  Many ED visits for asthma. She is unclear about triggers, saying allergy skin testing was negative. Admits some itchiness and increased wheeze in pollen seasons. Alo with colds, weather changes, strong odors.  Currently using her neb twice daily, Advair twice daily, rescue several times/ week, but needs refills.  She admits during the years she was not smoking she was very much better.  Current Covid restrictions keep her indoors, which seems to help.   Prior to Admission medications   Medication Sig Start Date End Date Taking? Authorizing Provider  albuterol (PROVENTIL) (2.5 MG/3ML) 0.083% nebulizer solution Take 6 mLs (5 mg total) by nebulization every 4 (four) hours as needed for wheezing or shortness of breath. 01/12/19  Yes Mohogany Toppins, Tarri Fuller D, MD  albuterol (VENTOLIN HFA) 108 (90 Base) MCG/ACT inhaler Inhale 1-2 puffs into the lungs every 6 (six) hours as needed for wheezing or shortness of breath. 01/12/19  Yes Gabriella Woodhead, Tarri Fuller D, MD  ferrous sulfate 325 (65 FE) MG EC tablet Take 325 mg by mouth daily.   Yes [provider]  fluticasone (FLONASE) 50 MCG/ACT nasal spray Place 2 sprays into both nostrils daily. 12/26/18  Yes Lauree Chandler, NP  fluticasone-salmeterol (ADVAIR HFA) 230-21 MCG/ACT inhaler  Inhale 2 puffs into the lungs 2 (two) times daily. 12/26/18  Yes Lauree Chandler, NP  guaiFENesin (MUCINEX) 600 MG 12 hr tablet Take 600 mg by mouth daily.   Yes [provider]  montelukast (SINGULAIR) 10 MG tablet TAKE 1 TABLET (10 MG TOTAL) BY MOUTH AT BEDTIME. 05/18/18  Yes Gildardo Cranker, DO  predniSONE (STERAPRED UNI-PAK 21 TAB) 10 MG (21) TBPK tablet Use as directed 01/02/19  Yes Lauree Chandler, NP  varenicline (CHANTIX) 0.5 MG tablet Take 0.5 mg tablet daily x 3 days then Take 0.5 mg tablet twice daily x 7 days then Take 1 mg tablet twice daily x 12 weeks 11/28/18  Yes Ngetich, Nelda Bucks, NP   Past Medical History:  Diagnosis Date  . Anemia   . Asthma   . Bronchitis   . COPD (chronic obstructive pulmonary disease) ([place])   . Obesity    Past Surgical History:  Procedure Laterality Date  . DENTAL SURGERY     2 teeth pulled, 11/22/14  . NO PAST SURGERIES     Family History  Problem Relation Age of Onset  . Diabetes Mother   . Cancer Mother   . Diabetes Sister   . Hypertension Sister   . Hypertension Sister   . Urticaria Neg Hx   . Immunodeficiency Neg Hx   . Eczema Neg Hx   . Asthma Neg Hx   . Angioedema Neg Hx   . Allergic rhinitis Neg Hx    Social History   Tobacco Use  . Smoking status: Current Every Day Smoker    Packs/day: 0.25    Years: 15.00    Pack  years: 3.75    Types: Cigarettes    Last attempt to quit: 09/16/2014    Years since quitting: 4.3  . Smokeless tobacco: Never Used  . Tobacco comment: smoking 2-3 ciggs per day/taking Chantix 01/12/19  Substance Use Topics  . Alcohol use: No    Alcohol/week: 0.0 standard drinks  . Drug use: No   ROS-see HPI   + = positive Constitutional:    weight loss, night sweats, fevers, chills, fatigue, lassitude. HEENT:    headaches, difficulty swallowing, tooth/dental problems, sore throat,       sneezing, itching, ear ache, nasal congestion, post nasal drip, snoring CV:    chest pain, orthopnea, PND, swelling  in lower extremities, anasarca,                                  dizziness, palpitations Resp:  + shortness of breath with exertion or at rest.                productive cough,   +non-productive cough, coughing up of blood.              change in color of mucus.  +wheezing.   Skin:    rash or lesions. GI:  No-   heartburn, indigestion, abdominal pain, nausea, vomiting, diarrhea,                 change in bowel habits, loss of appetite GU: dysuria, change in color of urine, no urgency or frequency.   flank pain. MS:   joint pain, stiffness, decreased range of motion, back pain. Neuro-     nothing unusual Psych:  change in mood or affect.  depression or anxiety.   memory loss.  OBJ- Physical Exam General- Alert, Oriented, Affect-appropriate, Distress- none acute, +morbidly obese 362 lbs Skin- rash-none, lesions- none, excoriation- none Lymphadenopathy- none Head- atraumatic            Eyes- Gross vision intact, PERRLA, conjunctivae and secretions clear            Ears- Hearing, canals-normal            Nose- Clear, no-Septal dev, mucus, polyps, erosion, perforation             Throat- Mallampati III, mucosa clear , drainage- none, tonsils- atrophic Neck- flexible , trachea midline, no stridor , thyroid nl, carotid no bruit Chest - symmetrical excursion , unlabored           Heart/CV- RRR , no murmur , no gallop  , no rub, nl s1 s2                           - JVD- none , edema- none, stasis changes- none, varices- none           Lung- unlabored wheeze+ mild, cough- none , dullness-none, rub- none           Chest wall-  Abd-  Br/ Gen/ Rectal- Not done, not indicated Extrem- cyanosis- none, clubbing, none, atrophy- none, strength- nl Neuro- grossly intact to observation

## 2019-01-12 NOTE — Patient Instructions (Addendum)
Order- depo 80 dx asthma moderate persistent  Order- lab- CBC w diff, total IgE     Future- to be done in 2 weeks- off prednisone  Please try very hard to stop smoking. You realize it will make you feel better.   Refill scripts sent for albuterol inhaler and neb solution  Please call if we can help. We want to try to help you stay out of the emergency room.

## 2019-01-12 NOTE — Assessment & Plan Note (Addendum)
Severity of obstruction may have varied with smoking at the time, possible exacerbation. Not clear how much fixed obstruction she has. Morbid obesity and deconditioning will contribute to dyspnea. Multiple ER visits reflect labile pattern. Allergy skin testing reported negative in 2017, but we will check IgE and Eos when off steroids,  to see if she might be a candidate for newer Biologic injections. Current meds are appropriate, but she asked for reflls and may have been out for a while. Depomedrol today.  When Covid restrictions relax, we will update PFT.

## 2019-01-21 ENCOUNTER — Other Ambulatory Visit: Payer: Self-pay

## 2019-01-21 ENCOUNTER — Encounter (HOSPITAL_COMMUNITY): Payer: Self-pay

## 2019-01-21 ENCOUNTER — Ambulatory Visit (HOSPITAL_COMMUNITY)
Admission: EM | Admit: 2019-01-21 | Discharge: 2019-01-21 | Disposition: A | Discharge status: Home / Self Care | Payer: 59 | Attending: Family Medicine | Admitting: Family Medicine

## 2019-01-21 DIAGNOSIS — J4531 Mild persistent asthma with (acute) exacerbation: Secondary | ICD-10-CM

## 2019-01-21 MED ORDER — PREDNISONE 10 MG PO TABS
ORAL_TABLET | ORAL | Status: AC
  Filled 2019-01-21: qty 1

## 2019-01-21 MED ORDER — IPRATROPIUM-ALBUTEROL 0.5-2.5 (3) MG/3ML IN SOLN: 3.0000 mL | Freq: Four times a day (QID) | RESPIRATORY_TRACT | 0 refills | Status: DC | PRN

## 2019-01-21 MED ORDER — PREDNISONE 50 MG PO TABS: 50.0000 mg | ORAL_TABLET | Freq: Every day | ORAL | 0 refills | Status: DC

## 2019-01-21 MED ORDER — PREDNISONE 20 MG PO TABS
ORAL_TABLET | ORAL | Status: AC
  Filled 2019-01-21: qty 2

## 2019-01-21 MED ORDER — PREDNISONE 20 MG PO TABS
50.0000 mg | ORAL_TABLET | Freq: Once | ORAL | Status: AC
  Administered 2019-01-21: 50 mg via ORAL

## 2019-01-21 MED ORDER — PREDNISONE 20 MG PO TABS
ORAL_TABLET | ORAL | Status: AC
  Filled 2019-01-21: qty 1

## 2019-01-21 NOTE — ED Provider Notes (Signed)
[place]    CSN: [id number] Arrival date & time: 01/21/19  1514     History   Chief Complaint Chief Complaint  Patient presents with   Asthma    HPI April Ellis is a 43 y.o. female.   43 year old female with history of asthma, COPD, comes in for 2 day history of asthma exacerbation. No obvious trigger, though thinks it may be the change in weather. Denies cough, congestion, rhinorrhea, sneezing. Denies fever, chills, night sweats. Has been using albuterol nebulizer and inhaler with temporary relief. She takes singulair and flonase for allergies and advair for asthma maintenance. She is in the process of quitting smoking, has finished chantix and has decrease number of cigs/day. However, did have 1 cig yesterday.      Past Medical History:  Diagnosis Date   Anemia    Asthma    Bronchitis    COPD (chronic obstructive pulmonary disease) ([place])    Obesity     Patient Active Problem List   Diagnosis Date Noted   Acute respiratory failure ([place]) 11/20/2018   Allergic reactions 08/16/2016   Chronic rhinitis 08/16/2016   Asthma, chronic, unspecified asthma severity, with acute exacerbation 09/30/2014   Leukocytosis 09/30/2014   Urticaria    Iron deficiency anemia 08/14/2014   History of tobacco abuse 08/14/2014   Moderate persistent asthma 08/13/2014   Atypical chest pain 06/27/2014   Tobacco abuse 05/12/2014   COPD (chronic obstructive pulmonary disease) ([place]) 05/01/2014   Morbid obesity ([place]) 12/19/2008   ELEVATED BLOOD PRESSURE WITHOUT DIAGNOSIS OF HYPERTENSION 12/19/2008    Past Surgical History:  Procedure Laterality Date   DENTAL SURGERY     2 teeth pulled, 11/22/14   NO PAST SURGERIES      OB History   No obstetric history on file.      Home Medications    Prior to Admission medications   Medication Sig Start Date End Date Taking? Authorizing Provider  albuterol (PROVENTIL) (2.5 MG/3ML) 0.083% nebulizer solution  Take 6 mLs (5 mg total) by nebulization every 4 (four) hours as needed for wheezing or shortness of breath. 01/12/19   Deneise Lever, MD  albuterol (VENTOLIN HFA) 108 (90 Base) MCG/ACT inhaler Inhale 1-2 puffs into the lungs every 6 (six) hours as needed for wheezing or shortness of breath. 01/12/19   Baird Lyons D, MD  ferrous sulfate 325 (65 FE) MG EC tablet Take 325 mg by mouth daily.    [provider]  fluticasone (FLONASE) 50 MCG/ACT nasal spray Place 2 sprays into both nostrils daily. 12/26/18   Lauree Chandler, NP  fluticasone-salmeterol (ADVAIR HFA) 230-21 MCG/ACT inhaler Inhale 2 puffs into the lungs 2 (two) times daily. 12/26/18   Lauree Chandler, NP  guaiFENesin (MUCINEX) 600 MG 12 hr tablet Take 600 mg by mouth daily.    [provider]  ipratropium-albuterol (DUONEB) 0.5-2.5 (3) MG/3ML SOLN Take 3 mLs by nebulization every 6 (six) hours as needed. 01/21/19   Tasia Catchings, Thomasena Vandenheuvel V, PA-C  montelukast (SINGULAIR) 10 MG tablet TAKE 1 TABLET (10 MG TOTAL) BY MOUTH AT BEDTIME. 05/18/18   Gildardo Cranker, DO  predniSONE (DELTASONE) 50 MG tablet Take 1 tablet (50 mg total) by mouth daily. 01/21/19   Ok Edwards, PA-C  varenicline (CHANTIX) 0.5 MG tablet Take 0.5 mg tablet daily x 3 days then Take 0.5 mg tablet twice daily x 7 days then Take 1 mg tablet twice daily x 12 weeks 11/28/18   Ngetich,  Nelda Bucks, NP    Family History Family History  Problem Relation Age of Onset   Diabetes Mother    Cancer Mother    Diabetes Sister    Hypertension Sister    Hypertension Sister    Urticaria Neg Hx    Immunodeficiency Neg Hx    Eczema Neg Hx    Asthma Neg Hx    Angioedema Neg Hx    Allergic rhinitis Neg Hx     Social History Social History   Tobacco Use   Smoking status: Current Every Day Smoker    Packs/day: 0.25    Years: 15.00    Pack years: 3.75    Types: Cigarettes    Last attempt to quit: 09/16/2014    Years since quitting: 4.3   Smokeless tobacco: Never Used    Tobacco comment: smoking 2-3 ciggs per day/taking Chantix 01/12/19  Substance Use Topics   Alcohol use: No    Alcohol/week: 0.0 standard drinks   Drug use: No     Allergies   Penicillins   Review of Systems Review of Systems  Reason unable to perform ROS: See HPI as above.     Physical Exam Triage Vital Signs ED Triage Vitals  Enc Vitals Group     BP 01/21/19 1526 (!) 138/58     Pulse Rate 01/21/19 1526 (!) 106     Resp 01/21/19 1526 15     Temp 01/21/19 1526 98.2 F (36.8 C)     Temp src --      SpO2 01/21/19 1526 96 %     Weight 01/21/19 1531 (!) 360 lb (163.3 kg)     Height --      Head Circumference --      Peak Flow --      Pain Score 01/21/19 1528 8     Pain Loc --      Pain Edu? --      Excl. in [place]? --    No data found.  Updated Vital Signs BP (!) 138/58 (BP Location: Right Arm)    Pulse (!) 106    Temp 98.2 F (36.8 C)    Resp 15    Wt (!) 360 lb (163.3 kg)    LMP 12/28/2018    SpO2 96%    BMI 65.84 kg/m   Physical Exam Constitutional:      General: She is not in acute distress.    Appearance: Normal appearance. She is not ill-appearing, toxic-appearing or diaphoretic.  HENT:     Head: Normocephalic and atraumatic.     Mouth/Throat:     Mouth: Mucous membranes are moist.     Pharynx: Oropharynx is clear. Uvula midline.  Neck:     Musculoskeletal: Normal range of motion and neck supple.  Cardiovascular:     Rate and Rhythm: Normal rate and regular rhythm.     Heart sounds: Normal heart sounds. No murmur. No friction rub. No gallop.   Pulmonary:     Effort: Pulmonary effort is normal. No accessory muscle usage, prolonged expiration, respiratory distress or retractions.     Comments: Patient speaking in full sentences without difficulty. Expiratory wheezing diffusely. Adequate air movement.  Neurological:     General: No focal deficit present.     Mental Status: She is alert and oriented to person, place, and time.    UC Treatments /  Results  Labs (all labs ordered are listed, but only abnormal results are displayed) Labs Reviewed - No data to  display  EKG None  Radiology No results found.  Procedures Procedures (including critical care time)  Medications Ordered in UC Medications  predniSONE (DELTASONE) tablet 50 mg (has no administration in time range)    Initial Impression / Assessment and Plan / UC Course  I have reviewed the triage vital signs and the nursing notes.  Pertinent labs & imaging results that were available during my care of the patient were reviewed by me and considered in my medical decision making (see chart for details).    Discussed with patient, given current COVID pandemic, unable to provide neb treatments in office. Patient without respiratory distress, speaking in full sentences without difficulty. O2 sat 96 % without tachypnea. Tachycardia resolved on exam. Expiratory wheezing diffusely with good air movement. Will provide first dose of prednisone in office. Will provide 2 doses of duoneb for patient to use at home as needed. Smoking cessation discussed.   Currently, asthma exacerbation does not seem to be due to a viral illness. Continue to monitor symptoms. Discussed if develop more viral symptoms such as worsening cough, fever, chills, body aches, to self quarantine. Self quarantine guidelines discussed. Return precautions given. Patient expresses understanding and agrees to plan.  Final Clinical Impressions(s) / UC Diagnoses   Final diagnoses:  Mild persistent asthma with exacerbation    ED Prescriptions    Medication Sig Dispense Auth. Provider   predniSONE (DELTASONE) 50 MG tablet Take 1 tablet (50 mg total) by mouth daily. 4 tablet Emmerie Battaglia V, PA-C   ipratropium-albuterol (DUONEB) 0.5-2.5 (3) MG/3ML SOLN Take 3 mLs by nebulization every 6 (six) hours as needed. 6 mL Tobin Chad, [place] 01/21/19 1558

## 2019-01-21 NOTE — ED Triage Notes (Signed)
Pt states her asthma is flaring up this got worst today. Pt has been using her inhaler and neb.

## 2019-01-21 NOTE — Discharge Instructions (Signed)
First dose prednisone given in office today. Start prednisone as directed tomorrow. Duoneb as needed, then return to albuterol neb as needed. Continue allergy medicine. Call pulmonologist about chantix and other options to help quit smoking.   Currently asthma exacerbation more likely due to allergies/weather. If develop cold symptoms such as cough, fever, chills, body aches, please self quarantine for 7 days since symptoms started AND more than 72 hours of no fever and cough prior to leaving the house. You can call COVID hotline [phone]) or use [facility] online if you have questions, or symptoms worsens to determine where you should seek care. If experiencing shortness of breath, trouble breathing, call 911 and provide them with your current situation.

## 2019-02-02 ENCOUNTER — Telehealth: Payer: Self-pay | Admitting: Internal Medicine

## 2019-02-02 ENCOUNTER — Telehealth: Payer: Self-pay | Admitting: *Deleted

## 2019-02-02 MED ORDER — PREDNISONE 10 MG PO TABS: ORAL_TABLET | ORAL | 0 refills | Status: DC

## 2019-02-02 NOTE — Telephone Encounter (Signed)
Called and spoke with pt letting her know that [facility] wanted [place] to send in pred taper Rx to pharmacy for her. Pt expressed understanding. Verified pt's preferred pharmacy and sent Rx in for pt. Nothing further needed.

## 2019-02-02 NOTE — Telephone Encounter (Signed)
Returned call to patient to triage sx:  She is having a hard time with her asthma.She says breathing is from stable to sob, not much cough or wheezing. She had an exacerbation on 4/26 that sent her to the hosp. She was given neb tx and 50 mg of prednisone x 4 days.  She is using her Advair 230 2 puff bid,Singulair, and duoneb nebs 4 times daily plus rescue albuterol.  Please advise.

## 2019-02-02 NOTE — Telephone Encounter (Signed)
Prednisone 10 mg, # 20, 4 X 2 DAYS, 3 X 2 DAYS, 2 X 2 DAYS, 1 X 2 DAYS  

## 2019-02-02 NOTE — Telephone Encounter (Signed)
Patient called and stated that she was having an Asthma Episode. Stated that she is feeling kinda tight with breathing. No other symptoms noted. No fever. No cough. Using the inhaler alittle more. Has to use the inhaler with any activity, even just walking from living room to kitchen.Working from home. Using her Nebulizer every 4 hours. Please Advise.   (no TeleVisits Available)

## 2019-02-02 NOTE — Telephone Encounter (Signed)
Recommend Telephone visit with available provider in the office but if not available will need evaluation in ED.

## 2019-02-02 NOTE — Telephone Encounter (Signed)
Spoke with Janett Billow, patient to call Pulmonologist or be Evaluated at ER. Patient notified and agreed.

## 2019-02-15 ENCOUNTER — Telehealth: Payer: Self-pay | Admitting: Internal Medicine

## 2019-02-15 ENCOUNTER — Observation Stay (HOSPITAL_COMMUNITY)
Admission: EM | Admit: 2019-02-15 | Discharge: 2019-02-16 | Disposition: A | Discharge status: Home / Self Care | Payer: 59 | Attending: Internal Medicine | Admitting: Internal Medicine

## 2019-02-15 ENCOUNTER — Other Ambulatory Visit: Payer: Self-pay

## 2019-02-15 ENCOUNTER — Encounter (HOSPITAL_COMMUNITY): Payer: Self-pay | Admitting: Emergency Medicine

## 2019-02-15 ENCOUNTER — Emergency Department (HOSPITAL_COMMUNITY): Payer: 59

## 2019-02-15 DIAGNOSIS — Z6841 Body Mass Index (BMI) 40.0 and over, adult: Secondary | ICD-10-CM | POA: Diagnosis not present

## 2019-02-15 DIAGNOSIS — R0603 Acute respiratory distress: Secondary | ICD-10-CM | POA: Diagnosis not present

## 2019-02-15 DIAGNOSIS — J45901 Unspecified asthma with (acute) exacerbation: Secondary | ICD-10-CM

## 2019-02-15 DIAGNOSIS — J9601 Acute respiratory failure with hypoxia: Principal | ICD-10-CM | POA: Insufficient documentation

## 2019-02-15 DIAGNOSIS — Z79899 Other long term (current) drug therapy: Secondary | ICD-10-CM | POA: Diagnosis not present

## 2019-02-15 DIAGNOSIS — D509 Iron deficiency anemia, unspecified: Secondary | ICD-10-CM | POA: Insufficient documentation

## 2019-02-15 DIAGNOSIS — R0600 Dyspnea, unspecified: Secondary | ICD-10-CM | POA: Diagnosis present

## 2019-02-15 DIAGNOSIS — Z1159 Encounter for screening for other viral diseases: Secondary | ICD-10-CM | POA: Diagnosis not present

## 2019-02-15 DIAGNOSIS — F17210 Nicotine dependence, cigarettes, uncomplicated: Secondary | ICD-10-CM | POA: Diagnosis not present

## 2019-02-15 DIAGNOSIS — Z8249 Family history of ischemic heart disease and other diseases of the circulatory system: Secondary | ICD-10-CM | POA: Diagnosis not present

## 2019-02-15 DIAGNOSIS — R739 Hyperglycemia, unspecified: Secondary | ICD-10-CM | POA: Insufficient documentation

## 2019-02-15 DIAGNOSIS — Z720 Tobacco use: Secondary | ICD-10-CM

## 2019-02-15 DIAGNOSIS — J449 Chronic obstructive pulmonary disease, unspecified: Secondary | ICD-10-CM | POA: Diagnosis not present

## 2019-02-15 DIAGNOSIS — D72829 Elevated white blood cell count, unspecified: Secondary | ICD-10-CM | POA: Diagnosis not present

## 2019-02-15 DIAGNOSIS — Z7951 Long term (current) use of inhaled steroids: Secondary | ICD-10-CM | POA: Insufficient documentation

## 2019-02-15 LAB — BASIC METABOLIC PANEL
Anion gap: 7 (ref 5–15)
BUN: 11 mg/dL (ref 6–20)
CO2: 27 mmol/L (ref 22–32)
Calcium: 8.9 mg/dL (ref 8.9–10.3)
Chloride: 104 mmol/L (ref 98–111)
Creatinine, Ser: 0.75 mg/dL (ref 0.44–1.00)
GFR calc Af Amer: >60 mL/min (ref >60)
GFR calc non Af Amer: >60 mL/min (ref >60)
Glucose, Bld: 100 mg/dL — ABNORMAL HIGH (ref 70–99)
Potassium: 3.9 mmol/L (ref 3.5–5.1)
Sodium: 138 mmol/L (ref 135–145)

## 2019-02-15 LAB — CBC WITH DIFFERENTIAL/PLATELET
Abs Immature Granulocytes: 0.06 10*3/uL (ref 0.00–0.07)
Basophils Absolute: 0.1 10*3/uL (ref 0.0–0.1)
Basophils Relative: 1 %
Eosinophils Absolute: 1.1 10*3/uL — ABNORMAL HIGH (ref 0.0–0.5)
Eosinophils Relative: 8 %
HCT: 39.4 % (ref 36.0–46.0)
Hemoglobin: 10.5 g/dL — ABNORMAL LOW (ref 12.0–15.0)
Immature Granulocytes: 0 %
Lymphocytes Relative: 17 %
Lymphs Abs: 2.3 10*3/uL (ref 0.7–4.0)
MCH: 19.3 pg — ABNORMAL LOW (ref 26.0–34.0)
MCHC: 26.6 g/dL — ABNORMAL LOW (ref 30.0–36.0)
MCV: 72.3 fL — ABNORMAL LOW (ref 80.0–100.0)
Monocytes Absolute: 0.9 10*3/uL (ref 0.1–1.0)
Monocytes Relative: 7 %
Neutro Abs: 9 10*3/uL — ABNORMAL HIGH (ref 1.7–7.7)
Neutrophils Relative %: 67 %
Platelets: 341 10*3/uL (ref 150–400)
RBC: 5.45 MIL/uL — ABNORMAL HIGH (ref 3.87–5.11)
RDW: 21.1 % — ABNORMAL HIGH (ref 11.5–15.5)
WBC: 13.4 10*3/uL — ABNORMAL HIGH (ref 4.0–10.5)
nRBC: 0 % (ref 0.0–0.2)

## 2019-02-15 LAB — I-STAT BETA HCG BLOOD, ED (MC, WL, AP ONLY): I-stat hCG, quantitative: <5 m[IU]/mL (ref <5)

## 2019-02-15 LAB — SARS CORONAVIRUS 2 BY RT PCR (HOSPITAL ORDER, PERFORMED IN ~~LOC~~ HOSPITAL LAB): SARS Coronavirus 2: NEGATIVE

## 2019-02-15 MED ORDER — SODIUM CHLORIDE 0.9% FLUSH: 3.0000 mL | INTRAVENOUS | Status: DC | PRN

## 2019-02-15 MED ORDER — FLUTICASONE FUROATE-VILANTEROL 200-25 MCG/INH IN AEPB
1.0000 | INHALATION_SPRAY | Freq: Every day | RESPIRATORY_TRACT | Status: DC
  Administered 2019-02-16: 08:00:00 1 via RESPIRATORY_TRACT
  Filled 2019-02-15: qty 28

## 2019-02-15 MED ORDER — SODIUM CHLORIDE 0.9 % IV SOLN
250.0000 mL | INTRAVENOUS | Status: DC | PRN
  Administered 2019-02-16: 10:00:00 250 mL via INTRAVENOUS

## 2019-02-15 MED ORDER — PREDNISONE 20 MG PO TABS
60.0000 mg | ORAL_TABLET | Freq: Once | ORAL | Status: AC
  Administered 2019-02-15: 60 mg via ORAL
  Filled 2019-02-15: qty 3

## 2019-02-15 MED ORDER — ACETAMINOPHEN 650 MG RE SUPP: 650.0000 mg | Freq: Four times a day (QID) | RECTAL | Status: DC | PRN

## 2019-02-15 MED ORDER — ENOXAPARIN SODIUM 40 MG/0.4ML ~~LOC~~ SOLN
40.0000 mg | Freq: Every day | SUBCUTANEOUS | Status: DC
  Administered 2019-02-16: 01:00:00 40 mg via SUBCUTANEOUS
  Filled 2019-02-15: qty 0.4

## 2019-02-15 MED ORDER — ALBUTEROL SULFATE (2.5 MG/3ML) 0.083% IN NEBU
2.5000 mg | INHALATION_SOLUTION | Freq: Four times a day (QID) | RESPIRATORY_TRACT | Status: DC
  Administered 2019-02-16: 01:00:00 2.5 mg via RESPIRATORY_TRACT
  Filled 2019-02-15: qty 3

## 2019-02-15 MED ORDER — ALBUTEROL SULFATE (2.5 MG/3ML) 0.083% IN NEBU: 2.5000 mg | INHALATION_SOLUTION | Freq: Four times a day (QID) | RESPIRATORY_TRACT | Status: DC | PRN

## 2019-02-15 MED ORDER — ALBUTEROL SULFATE HFA 108 (90 BASE) MCG/ACT IN AERS
8.0000 | INHALATION_SPRAY | Freq: Once | RESPIRATORY_TRACT | Status: AC
  Administered 2019-02-15: 8 via RESPIRATORY_TRACT
  Filled 2019-02-15: qty 6.7

## 2019-02-15 MED ORDER — SODIUM CHLORIDE 0.9% FLUSH
3.0000 mL | Freq: Two times a day (BID) | INTRAVENOUS | Status: DC
  Administered 2019-02-16 (×2): 3 mL via INTRAVENOUS

## 2019-02-15 MED ORDER — ACETAMINOPHEN 325 MG PO TABS: 650.0000 mg | ORAL_TABLET | Freq: Four times a day (QID) | ORAL | Status: DC | PRN

## 2019-02-15 MED ORDER — METHYLPREDNISOLONE SODIUM SUCC 125 MG IJ SOLR
80.0000 mg | Freq: Three times a day (TID) | INTRAMUSCULAR | Status: DC
  Administered 2019-02-16 (×2): 80 mg via INTRAVENOUS
  Filled 2019-02-15 (×2): qty 2

## 2019-02-15 NOTE — H&P (Signed)
TRH H&P    Patient Demographics:    April Ellis, is a 43 y.o. female  MRN: [id number]  DOB - [birth year]  Admit Date - 02/15/2019  Referring MD/NP/PA:  Lennice Sites  Outpatient Primary MD for the patient is Ngetich, Nelda Bucks, NP [facility]  Patient coming from:  home  Chief complaint- dyspnea, wheezing   HPI:    April Ellis  is a 43 y.o. female, with h/o asthma, morbid obeisity apparently c/o dyspnea and wheezing for the past several days, feels like her regular asthma exacerbation, apparently went to urgent care and sent to ER due to pox 80's on RA.  Pt has slight dry cough.  Denies any covid contact.  Pt denies fever, chills, cp, palp, n/v, abd pain, diarrhea, brbpr, dysuria.  Pt takes advair at home and states that she has been compliant with medication. Thinks that the change in weather triggered this.  Pt admits to smoking as well.    In ED.  T 98.5,  P 80 R 17  Bp 140/98  Pox 98% on 2L [place], desats into 80's without o2  IMPRESSION: 1. No acute cardiopulmonary process identified. 2. Stable enlarged cardiac silhouette  SARS negative  Wbc 13.4, Hgb 10.5, Plt 341  Mcv 72.3  Rdw 21.1  Na 138 K 3.9  Bun 11, Creatinine 0.75 Glucose 100  Pt treated with prednisone 60mg  po x1 in the ED, albuterol neb without improvement in wheezing  Pt will be admitted for asthma exacerbation.     Review of systems:    In addition to the HPI above,  No Fever-chills, No Headache, No changes with Vision or hearing, No problems swallowing food or Liquids, No Chest pain,   No Abdominal pain, No Nausea or Vomiting, bowel movements are regular, No Blood in stool or Urine, No dysuria, No new skin rashes or bruises, No new joints pains-aches,  No new weakness, tingling, numbness in any extremity, No recent weight gain or loss, No polyuria, polydypsia or polyphagia, No significant Mental  Stressors.  All other systems reviewed and are negative.    Past History of the following :    Past Medical History:  Diagnosis Date  . Anemia   . Asthma   . Bronchitis   . COPD (chronic obstructive pulmonary disease) ([place])   . Obesity       Past Surgical History:  Procedure Laterality Date  . DENTAL SURGERY     2 teeth pulled, 11/22/14  . NO PAST SURGERIES        Social History:      Social History   Tobacco Use  . Smoking status: Current Every Day Smoker    Packs/day: 0.25    Years: 15.00    Pack years: 3.75    Types: Cigarettes    Last attempt to quit: 09/16/2014    Years since quitting: 4.4  . Smokeless tobacco: Never Used  . Tobacco comment: smoking 2-3 ciggs per day/taking Chantix 01/12/19  Substance Use Topics  . Alcohol use: No  Alcohol/week: 0.0 standard drinks       Family History :     Family History  Problem Relation Age of Onset  . Diabetes Mother   . Cancer Mother   . Diabetes Sister   . Hypertension Sister   . Hypertension Sister   . Urticaria Neg Hx   . Immunodeficiency Neg Hx   . Eczema Neg Hx   . Asthma Neg Hx   . Angioedema Neg Hx   . Allergic rhinitis Neg Hx        Home Medications:   Prior to Admission medications   Medication Sig Start Date End Date Taking? Authorizing Provider  albuterol (PROVENTIL) (2.5 MG/3ML) 0.083% nebulizer solution Take 6 mLs (5 mg total) by nebulization every 4 (four) hours as needed for wheezing or shortness of breath. 01/12/19  Yes Young, Tarri Fuller D, MD  albuterol (VENTOLIN HFA) 108 (90 Base) MCG/ACT inhaler Inhale 1-2 puffs into the lungs every 6 (six) hours as needed for wheezing or shortness of breath. 01/12/19  Yes Young, Tarri Fuller D, MD  ferrous sulfate 325 (65 FE) MG EC tablet Take 325 mg by mouth daily.   Yes [provider]  fluticasone (FLONASE) 50 MCG/ACT nasal spray Place 2 sprays into both nostrils daily. 12/26/18  Yes Lauree Chandler, NP  fluticasone-salmeterol (ADVAIR HFA)  230-21 MCG/ACT inhaler Inhale 2 puffs into the lungs 2 (two) times daily. 12/26/18  Yes Lauree Chandler, NP  montelukast (SINGULAIR) 10 MG tablet TAKE 1 TABLET (10 MG TOTAL) BY MOUTH AT BEDTIME. 05/18/18  Yes Gildardo Cranker, DO  ipratropium-albuterol (DUONEB) 0.5-2.5 (3) MG/3ML SOLN Take 3 mLs by nebulization every 6 (six) hours as needed. Patient not taking: Reported on 02/15/2019 01/21/19   Ok Edwards, PA-C  predniSONE (DELTASONE) 10 MG tablet Take 4tabsx2days,3tabsx2days,2tabsx2days,1tabx2days,then stop Patient not taking: Reported on 02/15/2019 02/02/19   Deneise Lever, MD  varenicline (CHANTIX) 0.5 MG tablet Take 0.5 mg tablet daily x 3 days then Take 0.5 mg tablet twice daily x 7 days then Take 1 mg tablet twice daily x 12 weeks Patient not taking: Reported on 02/15/2019 11/28/18   Ngetich, Nelda Bucks, NP     Allergies:     Allergies  Allergen Reactions  . Penicillins     Childhood Allergy Did it involve swelling of the face/tongue/throat, SOB, or low BP? UNK Did it involve sudden or severe rash/hives, skin peeling, or any reaction on the inside of your mouth or nose? UNK Did you need to seek medical attention at a hospital or doctor's office? UNK When did it last happen?more than 10 years If all above answers are "NO", may proceed with cephalosporin use.      Physical Exam:   Vitals  Blood pressure 132/76, pulse 80, temperature 98.5 F (36.9 C), temperature source Oral, resp. rate 17, SpO2 98 %.  1.  General: Morbidly obese , axox3  2. Psychiatric: euthymic  3. Neurologic: cn2-12 intact, reflexes 2+ symmetric, diffuse with no clonus, motor 5/5 in all 4 ext  4. HEENMT:  Anicteric, pupils, 1.46mm symmetric, direct, consensual, near intact Neck: no jvd  5. Respiratory : CTAB  6. Cardiovascular : rrr s1, s2, no m/g/r  7. Gastrointestinal:  Abd: soft, morbidly obese, nt, nd, +bs  8. Skin:  Ext: no c/c/e,  No rash  9.Musculoskeletal:  Good ROM  No adenopathy     Data Review:    CBC Recent Labs  Lab 02/15/19 2142  WBC 13.4*  HGB 10.5*  HCT  39.4  PLT 341  MCV 72.3*  MCH 19.3*  MCHC 26.6*  RDW 21.1*  LYMPHSABS 2.3  MONOABS 0.9  EOSABS 1.1*  BASOSABS 0.1   ------------------------------------------------------------------------------------------------------------------  Results for orders placed or performed during the hospital encounter of 02/15/19 (from the past 48 hour(s))  CBC with Differential     Status: Abnormal   Collection Time: 02/15/19  9:42 PM  Result Value Ref Range   WBC 13.4 (H) 4.0 - 10.5 K/uL   RBC 5.45 (H) 3.87 - 5.11 MIL/uL   Hemoglobin 10.5 (L) 12.0 - 15.0 g/dL   HCT 39.4 36.0 - 46.0 %   MCV 72.3 (L) 80.0 - 100.0 fL   MCH 19.3 (L) 26.0 - 34.0 pg   MCHC 26.6 (L) 30.0 - 36.0 g/dL   RDW 21.1 (H) 11.5 - 15.5 %   Platelets 341 150 - 400 K/uL   nRBC 0.0 0.0 - 0.2 %   Neutrophils Relative % 67 %   Neutro Abs 9.0 (H) 1.7 - 7.7 K/uL   Lymphocytes Relative 17 %   Lymphs Abs 2.3 0.7 - 4.0 K/uL   Monocytes Relative 7 %   Monocytes Absolute 0.9 0.1 - 1.0 K/uL   Eosinophils Relative 8 %   Eosinophils Absolute 1.1 (H) 0.0 - 0.5 K/uL   Basophils Relative 1 %   Basophils Absolute 0.1 0.0 - 0.1 K/uL   Immature Granulocytes 0 %   Abs Immature Granulocytes 0.06 0.00 - 0.07 K/uL   Polychromasia PRESENT     Comment: Performed at [facility], [place] [address], [place], [place] [address]  Basic metabolic panel     Status: Abnormal   Collection Time: 02/15/19  9:42 PM  Result Value Ref Range   Sodium 138 135 - 145 mmol/L   Potassium 3.9 3.5 - 5.1 mmol/L   Chloride 104 98 - 111 mmol/L   CO2 27 22 - 32 mmol/L   Glucose, Bld 100 (H) 70 - 99 mg/dL   BUN 11 6 - 20 mg/dL   Creatinine, Ser 0.75 0.44 - 1.00 mg/dL   Calcium 8.9 8.9 - 10.3 mg/dL   GFR calc non Af Amer >60 >60 mL/min   GFR calc Af Amer >60 >60 mL/min   Anion gap 7 5 - 15    Comment: Performed at [facility], [place]  [address], [place], [place] [address]  SARS Coronavirus 2 (CEPHEID- Performed in [facility] hospital lab), Hosp Order     Status: None   Collection Time: 02/15/19  9:42 PM  Result Value Ref Range   SARS Coronavirus 2 NEGATIVE NEGATIVE    Comment: (NOTE) If result is NEGATIVE SARS-CoV-2 target nucleic acids are NOT DETECTED. The SARS-CoV-2 RNA is generally detectable in upper and lower  respiratory specimens during the acute phase of infection. The lowest  concentration of SARS-CoV-2 viral copies this assay can detect is 250  copies / mL. A negative result does not preclude SARS-CoV-2 infection  and should not be used as the sole basis for treatment or other  patient management decisions.  A negative result may occur with  improper specimen collection / handling, submission of specimen other  than nasopharyngeal swab, presence of viral mutation(s) within the  areas targeted by this assay, and inadequate number of viral copies  (<250 copies / mL). A negative result must be combined with clinical  observations, patient history, and epidemiological information. If result is POSITIVE SARS-CoV-2 target nucleic acids are DETECTED. The SARS-CoV-2 RNA  is generally detectable in upper and lower  respiratory specimens dur ing the acute phase of infection.  Positive  results are indicative of active infection with SARS-CoV-2.  Clinical  correlation with patient history and other diagnostic information is  necessary to determine patient infection status.  Positive results do  not rule out bacterial infection or co-infection with other viruses. If result is PRESUMPTIVE POSTIVE SARS-CoV-2 nucleic acids MAY BE PRESENT.   A presumptive positive result was obtained on the submitted specimen  and confirmed on repeat testing.  While 2019 novel coronavirus  (SARS-CoV-2) nucleic acids may be present in the submitted sample  additional confirmatory testing may be necessary for epidemiological  and / or  clinical management purposes  to differentiate between  SARS-CoV-2 and other Sarbecovirus currently known to infect humans.  If clinically indicated additional testing with an alternate test  methodology [phone]) is advised. The SARS-CoV-2 RNA is generally  detectable in upper and lower respiratory sp ecimens during the acute  phase of infection. The expected result is Negative. Fact Sheet for Patients:  StrictlyIdeas.no Fact Sheet for Healthcare Providers: BankingDealers.co.za This test is not yet approved or cleared by the [place] FDA and has been authorized for detection and/or diagnosis of SARS-CoV-2 by FDA under an Emergency Use Authorization (EUA).  This EUA will remain in effect (meaning this test can be used) for the duration of the COVID-19 declaration under Section 564(b)(1) of the Act, 21 U.S.C. section 360bbb-3(b)(1), unless the authorization is terminated or revoked sooner. Performed at [facility], [place] [address], [place], [place] [address]   I-Stat Beta hCG blood, ED ([facility], [facility], [facility] only)     Status: None   Collection Time: 02/15/19  9:49 PM  Result Value Ref Range   I-stat hCG, quantitative <5.0 <5 mIU/mL   Comment 3            Comment:   GEST. AGE      CONC.  (mIU/mL)   <=1 WEEK        5 - 50     2 WEEKS       50 - 500     3 WEEKS       100 - 10,000     4 WEEKS     1,000 - 30,000        FEMALE AND NON-PREGNANT FEMALE:     LESS THAN 5 mIU/mL     Chemistries  Recent Labs  Lab 02/15/19 2142  NA 138  K 3.9  CL 104  CO2 27  GLUCOSE 100*  BUN 11  CREATININE 0.75  CALCIUM 8.9   ------------------------------------------------------------------------------------------------------------------  ------------------------------------------------------------------------------------------------------------------ GFR: CrCl cannot be calculated (Unknown ideal weight.). Liver Function Tests: No  results for input(s): AST, ALT, ALKPHOS, BILITOT, PROT, ALBUMIN in the last 168 hours. No results for input(s): LIPASE, AMYLASE in the last 168 hours. No results for input(s): AMMONIA in the last 168 hours. Coagulation Profile: No results for input(s): INR, PROTIME in the last 168 hours. Cardiac Enzymes: No results for input(s): CKTOTAL, CKMB, CKMBINDEX, TROPONINI in the last 168 hours. BNP (last 3 results) No results for input(s): PROBNP in the last 8760 hours. HbA1C: No results for input(s): HGBA1C in the last 72 hours. CBG: No results for input(s): GLUCAP in the last 168 hours. Lipid Profile: No results for input(s): CHOL, HDL, LDLCALC, TRIG, CHOLHDL, LDLDIRECT in the last 72 hours. Thyroid Function Tests: No results for input(s): TSH, T4TOTAL, FREET4, T3FREE, THYROIDAB in the last  72 hours. Anemia Panel: No results for input(s): VITAMINB12, FOLATE, FERRITIN, TIBC, IRON, RETICCTPCT in the last 72 hours.  --------------------------------------------------------------------------------------------------------------- Urine analysis:    Component Value Date/Time   COLORURINE YELLOW 04/09/2014 2007   APPEARANCEUR CLEAR 04/09/2014 2007   LABSPEC 1.015 04/09/2014 2007   PHURINE 7.0 04/09/2014 2007   GLUCOSEU NEGATIVE 04/09/2014 2007   GLUCOSEU NEGATIVE 12/19/2008 1343   HGBUR NEGATIVE 04/09/2014 2007   BILIRUBINUR NEGATIVE 04/09/2014 2007   KETONESUR NEGATIVE 04/09/2014 2007   PROTEINUR NEGATIVE 04/09/2014 2007   UROBILINOGEN 1.0 04/09/2014 2007   NITRITE NEGATIVE 04/09/2014 2007   LEUKOCYTESUR NEGATIVE 04/09/2014 2007      Imaging Results:    Dg Chest Portable 1 View  Result Date: 02/15/2019 CLINICAL DATA:  Asthma EXAM: PORTABLE CHEST 1 VIEW COMPARISON:  11/20/2018 FINDINGS: The cardiac silhouette remains borderline enlarged. Both lungs are clear. The visualized skeletal structures are unremarkable. IMPRESSION: 1. No acute cardiopulmonary process identified. 2. Stable  enlarged cardiac silhouette Electronically Signed   By: Constance Holster M.D.   On: 02/15/2019 21:36       Assessment & Plan:    Active Problems:   Tobacco abuse   Iron deficiency anemia   Asthma, chronic, unspecified asthma severity, with acute exacerbation   Dyspnea  Dyspnea (acute hypoxic resp failure), Acute asthma exacerbation  Solumedrol 80mg  iv q8h Advair=> Breo 1puff qday Albuterol neb q6h and q6h prn Cont Singulair 10mg  po qday  Leukocytosis Check cbc in am  Iron deficiency anemia Cont ferrous sulfate Check cbc in am  Tobacco abuse Pt counselled on smoking cessation x 3 minutes Nicotine patch 21mg  topically qday prn   Morbid obeisity Please have r/o sleep apnea as outpatient   DVT Prophylaxis-   Lovenox - SCDs   AM Labs Ordered, also please review Full Orders  Family Communication: Admission, patients condition and plan of care including tests being ordered have been discussed with the patient  who indicate understanding and agree with the plan and Code Status.  Code Status:  FULL CODE  Admission status: Observation  : Based on patients clinical presentation and evaluation of above clinical data, I have made determination that patient meets observation criteria at this time. Pt will require iv steroids as well as multiple breathing treatments for Acute asthma exacerbation,  Failed oral prednisone.  Pt will require admission.  Depending upon her response to treatment may require   Time spent in minutes :  70 minutes   Jani Gravel M.D on 02/15/2019 at 11:50 PM

## 2019-02-15 NOTE — ED Provider Notes (Signed)
[facility] DEPT Provider Note   CSN: [id number] Arrival date & time: 02/15/19  2018    History   Chief Complaint Chief Complaint  Patient presents with   Asthma    HPI April Ellis is a 43 y.o. female.     The history is provided by the patient.  Asthma  This is a recurrent problem. The current episode started yesterday. The problem occurs daily. The problem has not changed since onset.Associated symptoms include shortness of breath. Pertinent negatives include no chest pain and no abdominal pain. The symptoms are aggravated by walking. Relieved by: inhaler, briefly. Treatments tried: albuterol. The treatment provided mild relief.    Past Medical History:  Diagnosis Date   Anemia    Asthma    Bronchitis    COPD (chronic obstructive pulmonary disease) ([place])    Obesity     Patient Active Problem List   Diagnosis Date Noted   Acute respiratory failure ([place]) 11/20/2018   Allergic reactions 08/16/2016   Chronic rhinitis 08/16/2016   Asthma, chronic, unspecified asthma severity, with acute exacerbation 09/30/2014   Leukocytosis 09/30/2014   Urticaria    Iron deficiency anemia 08/14/2014   History of tobacco abuse 08/14/2014   Moderate persistent asthma 08/13/2014   Atypical chest pain 06/27/2014   Tobacco abuse 05/12/2014   COPD (chronic obstructive pulmonary disease) ([place]) 05/01/2014   Morbid obesity ([place]) 12/19/2008   ELEVATED BLOOD PRESSURE WITHOUT DIAGNOSIS OF HYPERTENSION 12/19/2008    Past Surgical History:  Procedure Laterality Date   DENTAL SURGERY     2 teeth pulled, 11/22/14   NO PAST SURGERIES       OB History   No obstetric history on file.      Home Medications    Prior to Admission medications   Medication Sig Start Date End Date Taking? Authorizing Provider  albuterol (PROVENTIL) (2.5 MG/3ML) 0.083% nebulizer solution Take 6 mLs (5 mg total) by nebulization every 4 (four) hours as  needed for wheezing or shortness of breath. 01/12/19  Yes Young, Tarri Fuller D, MD  albuterol (VENTOLIN HFA) 108 (90 Base) MCG/ACT inhaler Inhale 1-2 puffs into the lungs every 6 (six) hours as needed for wheezing or shortness of breath. 01/12/19  Yes Young, Tarri Fuller D, MD  ferrous sulfate 325 (65 FE) MG EC tablet Take 325 mg by mouth daily.   Yes [provider]  fluticasone (FLONASE) 50 MCG/ACT nasal spray Place 2 sprays into both nostrils daily. 12/26/18  Yes Lauree Chandler, NP  fluticasone-salmeterol (ADVAIR HFA) 230-21 MCG/ACT inhaler Inhale 2 puffs into the lungs 2 (two) times daily. 12/26/18  Yes Lauree Chandler, NP  montelukast (SINGULAIR) 10 MG tablet TAKE 1 TABLET (10 MG TOTAL) BY MOUTH AT BEDTIME. 05/18/18  Yes Gildardo Cranker, DO  ipratropium-albuterol (DUONEB) 0.5-2.5 (3) MG/3ML SOLN Take 3 mLs by nebulization every 6 (six) hours as needed. Patient not taking: Reported on 02/15/2019 01/21/19   Ok Edwards, PA-C  predniSONE (DELTASONE) 10 MG tablet Take 4tabsx2days,3tabsx2days,2tabsx2days,1tabx2days,then stop Patient not taking: Reported on 02/15/2019 02/02/19   Deneise Lever, MD  varenicline (CHANTIX) 0.5 MG tablet Take 0.5 mg tablet daily x 3 days then Take 0.5 mg tablet twice daily x 7 days then Take 1 mg tablet twice daily x 12 weeks Patient not taking: Reported on 02/15/2019 11/28/18   Ngetich, Nelda Bucks, NP    Family History Family History  Problem Relation Age of Onset   Diabetes Mother    Cancer Mother  Diabetes Sister    Hypertension Sister    Hypertension Sister    Urticaria Neg Hx    Immunodeficiency Neg Hx    Eczema Neg Hx    Asthma Neg Hx    Angioedema Neg Hx    Allergic rhinitis Neg Hx     Social History Social History   Tobacco Use   Smoking status: Current Every Day Smoker    Packs/day: 0.25    Years: 15.00    Pack years: 3.75    Types: Cigarettes    Last attempt to quit: 09/16/2014    Years since quitting: 4.4   Smokeless tobacco: Never  Used   Tobacco comment: smoking 2-3 ciggs per day/taking Chantix 01/12/19  Substance Use Topics   Alcohol use: No    Alcohol/week: 0.0 standard drinks   Drug use: No     Allergies   Penicillins   Review of Systems Review of Systems  Constitutional: Negative for chills and fever.  HENT: Negative for ear pain and sore throat.   Eyes: Negative for pain and visual disturbance.  Respiratory: Positive for shortness of breath and wheezing. Negative for cough.   Cardiovascular: Negative for chest pain and palpitations.  Gastrointestinal: Negative for abdominal pain and vomiting.  Genitourinary: Negative for dysuria and hematuria.  Musculoskeletal: Negative for arthralgias and back pain.  Skin: Negative for color change and rash.  Neurological: Negative for seizures and syncope.  All other systems reviewed and are negative.    Physical Exam Updated Vital Signs  ED Triage Vitals [02/15/19 2027]  Enc Vitals Group     BP (!) 140/98     Pulse Rate 100     Resp 20     Temp 98.4 F (36.9 C)     Temp Source Oral     SpO2 100 %     Weight      Height      Head Circumference      Peak Flow      Pain Score 0     Pain Loc      Pain Edu?      Excl. in [place]?     Physical Exam Vitals signs and nursing note reviewed.  Constitutional:      General: She is not in acute distress.    Appearance: She is well-developed. She is obese. She is not ill-appearing.  HENT:     Head: Normocephalic and atraumatic.     Nose: Nose normal.     Mouth/Throat:     Mouth: Mucous membranes are moist.  Eyes:     Extraocular Movements: Extraocular movements intact.     Conjunctiva/sclera: Conjunctivae normal.     Pupils: Pupils are equal, round, and reactive to light.  Neck:     Musculoskeletal: Normal range of motion and neck supple.  Cardiovascular:     Rate and Rhythm: Normal rate and regular rhythm.     Pulses: Normal pulses.     Heart sounds: Normal heart sounds. No murmur.  Pulmonary:      Effort: Pulmonary effort is normal. No respiratory distress.     Breath sounds: Wheezing present.     Comments: Poor air movement, diminished throughout Abdominal:     General: There is no distension.     Palpations: Abdomen is soft.     Tenderness: There is no abdominal tenderness.  Skin:    General: Skin is warm and dry.  Neurological:     Mental Status: She is alert.  Psychiatric:  Mood and Affect: Mood normal.      ED Treatments / Results  Labs (all labs ordered are listed, but only abnormal results are displayed) Labs Reviewed  CBC WITH DIFFERENTIAL/PLATELET - Abnormal; Notable for the following components:      Result Value   WBC 13.4 (*)    RBC 5.45 (*)    Hemoglobin 10.5 (*)    MCV 72.3 (*)    MCH 19.3 (*)    MCHC 26.6 (*)    RDW 21.1 (*)    Neutro Abs 9.0 (*)    Eosinophils Absolute 1.1 (*)    All other components within normal limits  BASIC METABOLIC PANEL - Abnormal; Notable for the following components:   Glucose, Bld 100 (*)    All other components within normal limits  SARS CORONAVIRUS 2 (HOSPITAL ORDER, [facility])  I-STAT BETA HCG BLOOD, ED ([facility], [facility], [facility] ONLY)    EKG None  Radiology Dg Chest Portable 1 View  Result Date: 02/15/2019 CLINICAL DATA:  Asthma EXAM: PORTABLE CHEST 1 VIEW COMPARISON:  11/20/2018 FINDINGS: The cardiac silhouette remains borderline enlarged. Both lungs are clear. The visualized skeletal structures are unremarkable. IMPRESSION: 1. No acute cardiopulmonary process identified. 2. Stable enlarged cardiac silhouette Electronically Signed   By: Constance Holster M.D.   On: 02/15/2019 21:36    Procedures .Critical Care Performed by: Lennice Sites, DO Authorized by: Lennice Sites, DO   Critical care provider statement:    Critical care time (minutes):  35   Critical care was necessary to treat or prevent imminent or life-threatening deterioration of the following conditions:  Respiratory  failure   Critical care was time spent personally by me on the following activities:  Blood draw for specimens, development of treatment plan with patient or surrogate, discussions with primary provider, evaluation of patient's response to treatment, examination of patient, obtaining history from patient or surrogate, ordering and performing treatments and interventions, ordering and review of laboratory studies, ordering and review of radiographic studies, pulse oximetry, re-evaluation of patient's condition and review of old charts   I assumed direction of critical care for this patient from another provider in my specialty: no     (including critical care time)  Medications Ordered in ED Medications  predniSONE (DELTASONE) tablet 60 mg (60 mg Oral Given 02/15/19 2133)  albuterol (VENTOLIN HFA) 108 (90 Base) MCG/ACT inhaler 8 puff (8 puffs Inhalation Given 02/15/19 2134)     Initial Impression / Assessment and Plan / ED Course  I have reviewed the triage vital signs and the nursing notes.  Pertinent labs & imaging results that were available during my care of the patient were reviewed by me and considered in my medical decision making (see chart for details).     MYRIA STEENBERGEN is a 43 year old female with history of asthma who presents to the ED with asthma exacerbation, new hypoxia.  Patient went to urgent care for shortness of breath and wheezing that was not responding well with her inhaler which she has been using about 2 puffs every hour.  Patient was found to be hypoxic in the 80s and was sent from urgent care on 2 L of oxygen.  Patient overall has normal work of breathing.  She has very diminished air movement throughout with scattered wheezing.  Patient states that weather changes are likely the cause of her symptoms today.  She has had frequent asthma exacerbations over the last several months.  Has not been on  steroids recently.  On room air upon arrival patient is hypoxic in the low  80s.  She states that most of her symptoms are when she starts to walk.  She denies any chest pain.  No history of PE.  Given her history and physical suspect that hypoxia is from asthma exacerbation.  Will give albuterol treatments, prednisone.  Will get chest x-ray, basic labs and patient likely to be admitted for asthma exacerbation causing hypoxia and respiratory failure.  Will screen for COVID.  However, overall denies infectious symptoms. No heart failure hx.  Chest x-ray showed no signs of volume overload or pneumonia.  Patient mild leukocytosis.  But otherwise no significant anemia or electrolyte abnormality.  Pregnancy test negative.  COVID test is pending.  Patient feels better after albuterol treatment and steroids.  Patient stable on 2 L of oxygen.  Patient to be admitted for further asthma exacerbation care.  This chart was dictated using voice recognition software.  Despite best efforts to proofread,  errors can occur which can change the documentation meaning.    Final Clinical Impressions(s) / ED Diagnoses   Final diagnoses:  Acute respiratory failure with hypoxia ([place])  Mild asthma with exacerbation, unspecified whether persistent    ED Discharge Orders    None       Lennice Sites, DO 02/15/19 2235

## 2019-02-15 NOTE — ED Notes (Signed)
Pt resting in bed. Awaiting inpatient provider and admission to the hospital. Pt reports feeling better after albuterol tx. NAD at this time

## 2019-02-15 NOTE — Telephone Encounter (Signed)
Left message for patient to call back  

## 2019-02-15 NOTE — ED Notes (Signed)
Pt to room. Able to ambulate to the bed but became severely hypoxic upon doing so. Provider in the room. NAD at this time.

## 2019-02-15 NOTE — ED Triage Notes (Addendum)
Per EMS, patient from UC, where she was seen for low O2. Denies SOB, chest pain, cough, and fever. Wheezing. O2 initially in the 80's, 98% on 2L. Used inhaler twice with relief. Hx asthma.

## 2019-02-15 NOTE — ED Notes (Signed)
ED TO INPATIENT HANDOFF REPORT  ED Nurse Name and Phone #: Tomi Likens, RN  S Name/Age/Gender April Ellis 43 y.o. female Room/Bed: WA11/WA11  Code Status   Code Status: Full Code  Home/SNF/Other Home Patient oriented to: self, place, time and situation Is this baseline? Yes   Triage Complete: Triage complete  Chief Complaint SOB  Triage Note Per EMS, patient from UC, where she was seen for low O2. Denies SOB, chest pain, cough, and fever. Wheezing. O2 initially in the 80's, 98% on 2L. Used inhaler twice with relief. Hx asthma.   Allergies Allergies  Allergen Reactions  . Penicillins     Childhood Allergy Did it involve swelling of the face/tongue/throat, SOB, or low BP? UNK Did it involve sudden or severe rash/hives, skin peeling, or any reaction on the inside of your mouth or nose? UNK Did you need to seek medical attention at a hospital or doctor's office? UNK When did it last happen?more than 10 years If all above answers are "NO", may proceed with cephalosporin use.     Level of Care/Admitting Diagnosis ED Disposition    ED Disposition Condition [facility] Area: [place] [[id number]]  Level of Care: Med-Surg [16]  Covid Evaluation: Person Under Investigation (PUI)  Isolation Risk Level: Low Risk/Droplet (Less than 4L [place] supplementation)  Diagnosis: Dyspnea [222979]  Admitting Physician: Jani Gravel [[id number]]  Attending Physician: Jani Gravel [[id number]]  PT Class (Do Not Modify): Observation [104]  PT Acc Code (Do Not Modify): Observation [10022]       B Medical/Surgery History Past Medical History:  Diagnosis Date  . Anemia   . Asthma   . Bronchitis   . COPD (chronic obstructive pulmonary disease) ([place])   . Obesity    Past Surgical History:  Procedure Laterality Date  . DENTAL SURGERY     2 teeth pulled, 11/22/14  . NO PAST SURGERIES       A IV Location/Drains/Wounds Patient Lines/Drains/Airways Status   Active  Line/Drains/Airways    Name:   Placement date:   Placement time:   Site:   Days:   Peripheral IV 02/15/19 Left Antecubital   02/15/19    2144    Antecubital   less than 1          Intake/Output Last 24 hours No intake or output data in the 24 hours ending 02/15/19 2341  Labs/Imaging Results for orders placed or performed during the hospital encounter of 02/15/19 (from the past 48 hour(s))  CBC with Differential     Status: Abnormal   Collection Time: 02/15/19  9:42 PM  Result Value Ref Range   WBC 13.4 (H) 4.0 - 10.5 K/uL   RBC 5.45 (H) 3.87 - 5.11 MIL/uL   Hemoglobin 10.5 (L) 12.0 - 15.0 g/dL   HCT 39.4 36.0 - 46.0 %   MCV 72.3 (L) 80.0 - 100.0 fL   MCH 19.3 (L) 26.0 - 34.0 pg   MCHC 26.6 (L) 30.0 - 36.0 g/dL   RDW 21.1 (H) 11.5 - 15.5 %   Platelets 341 150 - 400 K/uL   nRBC 0.0 0.0 - 0.2 %   Neutrophils Relative % 67 %   Neutro Abs 9.0 (H) 1.7 - 7.7 K/uL   Lymphocytes Relative 17 %   Lymphs Abs 2.3 0.7 - 4.0 K/uL   Monocytes Relative 7 %   Monocytes Absolute 0.9 0.1 - 1.0 K/uL   Eosinophils Relative 8 %   Eosinophils Absolute 1.1 (  H) 0.0 - 0.5 K/uL   Basophils Relative 1 %   Basophils Absolute 0.1 0.0 - 0.1 K/uL   Immature Granulocytes 0 %   Abs Immature Granulocytes 0.06 0.00 - 0.07 K/uL   Polychromasia PRESENT     Comment: Performed at [facility], [place] [address], [place], [place] [address]  Basic metabolic panel     Status: Abnormal   Collection Time: 02/15/19  9:42 PM  Result Value Ref Range   Sodium 138 135 - 145 mmol/L   Potassium 3.9 3.5 - 5.1 mmol/L   Chloride 104 98 - 111 mmol/L   CO2 27 22 - 32 mmol/L   Glucose, Bld 100 (H) 70 - 99 mg/dL   BUN 11 6 - 20 mg/dL   Creatinine, Ser 0.75 0.44 - 1.00 mg/dL   Calcium 8.9 8.9 - 10.3 mg/dL   GFR calc non Af Amer >60 >60 mL/min   GFR calc Af Amer >60 >60 mL/min   Anion gap 7 5 - 15    Comment: Performed at [facility], [place] [address], [place], [place] [address]  SARS  Coronavirus 2 (CEPHEID- Performed in [facility] hospital lab), Hosp Order     Status: None   Collection Time: 02/15/19  9:42 PM  Result Value Ref Range   SARS Coronavirus 2 NEGATIVE NEGATIVE    Comment: (NOTE) If result is NEGATIVE SARS-CoV-2 target nucleic acids are NOT DETECTED. The SARS-CoV-2 RNA is generally detectable in upper and lower  respiratory specimens during the acute phase of infection. The lowest  concentration of SARS-CoV-2 viral copies this assay can detect is 250  copies / mL. A negative result does not preclude SARS-CoV-2 infection  and should not be used as the sole basis for treatment or other  patient management decisions.  A negative result may occur with  improper specimen collection / handling, submission of specimen other  than nasopharyngeal swab, presence of viral mutation(s) within the  areas targeted by this assay, and inadequate number of viral copies  (<250 copies / mL). A negative result must be combined with clinical  observations, patient history, and epidemiological information. If result is POSITIVE SARS-CoV-2 target nucleic acids are DETECTED. The SARS-CoV-2 RNA is generally detectable in upper and lower  respiratory specimens dur ing the acute phase of infection.  Positive  results are indicative of active infection with SARS-CoV-2.  Clinical  correlation with patient history and other diagnostic information is  necessary to determine patient infection status.  Positive results do  not rule out bacterial infection or co-infection with other viruses. If result is PRESUMPTIVE POSTIVE SARS-CoV-2 nucleic acids MAY BE PRESENT.   A presumptive positive result was obtained on the submitted specimen  and confirmed on repeat testing.  While 2019 novel coronavirus  (SARS-CoV-2) nucleic acids may be present in the submitted sample  additional confirmatory testing may be necessary for epidemiological  and / or clinical management purposes  to differentiate  between  SARS-CoV-2 and other Sarbecovirus currently known to infect humans.  If clinically indicated additional testing with an alternate test  methodology [phone]) is advised. The SARS-CoV-2 RNA is generally  detectable in upper and lower respiratory sp ecimens during the acute  phase of infection. The expected result is Negative. Fact Sheet for Patients:  StrictlyIdeas.no Fact Sheet for Healthcare Providers: BankingDealers.co.za This test is not yet approved or cleared by the [place] FDA and has been authorized for detection and/or diagnosis of SARS-CoV-2 by FDA under  an Emergency Use Authorization (EUA).  This EUA will remain in effect (meaning this test can be used) for the duration of the COVID-19 declaration under Section 564(b)(1) of the Act, 21 U.S.C. section 360bbb-3(b)(1), unless the authorization is terminated or revoked sooner. Performed at [facility], [place] [address], [place], [place] [address]   I-Stat Beta hCG blood, ED ([facility], [facility], [facility] only)     Status: None   Collection Time: 02/15/19  9:49 PM  Result Value Ref Range   I-stat hCG, quantitative <5.0 <5 mIU/mL   Comment 3            Comment:   GEST. AGE      CONC.  (mIU/mL)   <=1 WEEK        5 - 50     2 WEEKS       50 - 500     3 WEEKS       100 - 10,000     4 WEEKS     1,000 - 30,000        FEMALE AND NON-PREGNANT FEMALE:     LESS THAN 5 mIU/mL    Dg Chest Portable 1 View  Result Date: 02/15/2019 CLINICAL DATA:  Asthma EXAM: PORTABLE CHEST 1 VIEW COMPARISON:  11/20/2018 FINDINGS: The cardiac silhouette remains borderline enlarged. Both lungs are clear. The visualized skeletal structures are unremarkable. IMPRESSION: 1. No acute cardiopulmonary process identified. 2. Stable enlarged cardiac silhouette Electronically Signed   By: Constance Holster M.D.   On: 02/15/2019 21:36    Pending Labs Unresulted Labs (From admission, onward)    Start      Ordered   02/22/19 0500  Creatinine, serum  (enoxaparin (LOVENOX)    CrCl >/= 30 ml/min)  Weekly,   R    Comments:  while on enoxaparin therapy    02/15/19 2245   02/16/19 0500  Comprehensive metabolic panel  Tomorrow morning,   R     02/15/19 2245   02/16/19 0500  CBC  Tomorrow morning,   R     02/15/19 2245          Vitals/Pain Today's Vitals   02/15/19 2027 02/15/19 2130 02/15/19 2230 02/15/19 2300  BP: (!) 140/98  136/79 132/76  Pulse: 100  99 80  Resp: 20  18 17   Temp: 98.4 F (36.9 C)   98.5 F (36.9 C)  TempSrc: Oral   Oral  SpO2: 100%  98% 98%  PainSc: 0-No pain 0-No pain      Isolation Precautions No active isolations  Medications Medications  enoxaparin (LOVENOX) injection 40 mg (has no administration in time range)  sodium chloride flush (NS) 0.9 % injection 3 mL (has no administration in time range)  sodium chloride flush (NS) 0.9 % injection 3 mL (has no administration in time range)  0.9 %  sodium chloride infusion (has no administration in time range)  acetaminophen (TYLENOL) tablet 650 mg (has no administration in time range)    Or  acetaminophen (TYLENOL) suppository 650 mg (has no administration in time range)  methylPREDNISolone sodium succinate (SOLU-MEDROL) 125 mg/2 mL injection 80 mg (has no administration in time range)  fluticasone furoate-vilanterol (BREO ELLIPTA) 200-25 MCG/INH 1 puff (has no administration in time range)  albuterol (PROVENTIL) (2.5 MG/3ML) 0.083% nebulizer solution 2.5 mg (has no administration in time range)  albuterol (PROVENTIL) (2.5 MG/3ML) 0.083% nebulizer solution 2.5 mg (has no administration in time range)  predniSONE (DELTASONE) tablet 60 mg (60 mg  Oral Given 02/15/19 2133)  albuterol (VENTOLIN HFA) 108 (90 Base) MCG/ACT inhaler 8 puff (8 puffs Inhalation Given 02/15/19 2134)    Mobility walks Low fall risk   Focused Assessments Pulmonary Assessment Handoff:  Lung sounds: Bilateral Breath Sounds: Diminished,  Expiratory wheezes, Inspiratory wheezes O2 Device: Nasal Cannula O2 Flow Rate (L/min): 4 L/min      R Recommendations: See Admitting Provider Note  Report given to:   Additional Notes:

## 2019-02-16 ENCOUNTER — Other Ambulatory Visit: Payer: Self-pay

## 2019-02-16 DIAGNOSIS — J45901 Unspecified asthma with (acute) exacerbation: Secondary | ICD-10-CM | POA: Diagnosis not present

## 2019-02-16 DIAGNOSIS — J9601 Acute respiratory failure with hypoxia: Secondary | ICD-10-CM | POA: Diagnosis not present

## 2019-02-16 DIAGNOSIS — R0603 Acute respiratory distress: Secondary | ICD-10-CM | POA: Diagnosis not present

## 2019-02-16 DIAGNOSIS — D509 Iron deficiency anemia, unspecified: Secondary | ICD-10-CM | POA: Diagnosis not present

## 2019-02-16 DIAGNOSIS — Z720 Tobacco use: Secondary | ICD-10-CM | POA: Diagnosis not present

## 2019-02-16 LAB — CBC
HCT: 39.1 % (ref 36.0–46.0)
Hemoglobin: 10.2 g/dL — ABNORMAL LOW (ref 12.0–15.0)
MCH: 18.9 pg — ABNORMAL LOW (ref 26.0–34.0)
MCHC: 26.1 g/dL — ABNORMAL LOW (ref 30.0–36.0)
MCV: 72.3 fL — ABNORMAL LOW (ref 80.0–100.0)
Platelets: 352 10*3/uL (ref 150–400)
RBC: 5.41 MIL/uL — ABNORMAL HIGH (ref 3.87–5.11)
RDW: 21 % — ABNORMAL HIGH (ref 11.5–15.5)
WBC: 10.1 10*3/uL (ref 4.0–10.5)
nRBC: 0 % (ref 0.0–0.2)

## 2019-02-16 LAB — COMPREHENSIVE METABOLIC PANEL
ALT: 13 U/L (ref 0–44)
AST: 12 U/L — ABNORMAL LOW (ref 15–41)
Albumin: 3.7 g/dL (ref 3.5–5.0)
Alkaline Phosphatase: 87 U/L (ref 38–126)
Anion gap: 7 (ref 5–15)
BUN: 12 mg/dL (ref 6–20)
CO2: 26 mmol/L (ref 22–32)
Calcium: 9.1 mg/dL (ref 8.9–10.3)
Chloride: 104 mmol/L (ref 98–111)
Creatinine, Ser: 0.65 mg/dL (ref 0.44–1.00)
GFR calc Af Amer: >60 mL/min (ref >60)
GFR calc non Af Amer: >60 mL/min (ref >60)
Glucose, Bld: 114 mg/dL — ABNORMAL HIGH (ref 70–99)
Potassium: 4.8 mmol/L (ref 3.5–5.1)
Sodium: 137 mmol/L (ref 135–145)
Total Bilirubin: 0.3 mg/dL (ref 0.3–1.2)
Total Protein: 7.8 g/dL (ref 6.5–8.1)

## 2019-02-16 MED ORDER — AZITHROMYCIN 500 MG PO TABS: 500.0000 mg | ORAL_TABLET | Freq: Every day | ORAL | 0 refills | Status: DC

## 2019-02-16 MED ORDER — NICOTINE 21 MG/24HR TD PT24: 21.0000 mg | MEDICATED_PATCH | Freq: Every day | TRANSDERMAL | Status: DC | PRN

## 2019-02-16 MED ORDER — IPRATROPIUM-ALBUTEROL 0.5-2.5 (3) MG/3ML IN SOLN
3.0000 mL | Freq: Three times a day (TID) | RESPIRATORY_TRACT | Status: DC
  Administered 2019-02-16: 3 mL via RESPIRATORY_TRACT
  Filled 2019-02-16 (×2): qty 3

## 2019-02-16 MED ORDER — MONTELUKAST SODIUM 10 MG PO TABS: 10.0000 mg | ORAL_TABLET | Freq: Every day | ORAL | Status: DC

## 2019-02-16 MED ORDER — IPRATROPIUM-ALBUTEROL 0.5-2.5 (3) MG/3ML IN SOLN: 3.0000 mL | Freq: Four times a day (QID) | RESPIRATORY_TRACT | 0 refills | Status: DC | PRN

## 2019-02-16 MED ORDER — ALBUTEROL SULFATE (2.5 MG/3ML) 0.083% IN NEBU: 5.0000 mg | INHALATION_SOLUTION | RESPIRATORY_TRACT | 0 refills | Status: DC | PRN

## 2019-02-16 MED ORDER — GUAIFENESIN ER 600 MG PO TB12
1200.0000 mg | ORAL_TABLET | Freq: Two times a day (BID) | ORAL | Status: DC
  Administered 2019-02-16: 1200 mg via ORAL
  Filled 2019-02-16: qty 2

## 2019-02-16 MED ORDER — FERROUS SULFATE 325 (65 FE) MG PO TABS
325.0000 mg | ORAL_TABLET | Freq: Every day | ORAL | Status: DC
  Administered 2019-02-16: 325 mg via ORAL
  Filled 2019-02-16: qty 1

## 2019-02-16 MED ORDER — AZITHROMYCIN 500 MG PO TABS: 500.0000 mg | ORAL_TABLET | Freq: Every day | ORAL | 0 refills | Status: AC

## 2019-02-16 MED ORDER — GUAIFENESIN ER 600 MG PO TB12: 600.0000 mg | ORAL_TABLET | Freq: Two times a day (BID) | ORAL | 0 refills | Status: DC

## 2019-02-16 MED ORDER — ORAL CARE MOUTH RINSE
15.0000 mL | Freq: Two times a day (BID) | OROMUCOSAL | Status: DC
  Administered 2019-02-16 (×2): 15 mL via OROMUCOSAL

## 2019-02-16 MED ORDER — NICOTINE 21 MG/24HR TD PT24: 21.0000 mg | MEDICATED_PATCH | Freq: Every day | TRANSDERMAL | 0 refills | Status: DC | PRN

## 2019-02-16 MED ORDER — FLUTICASONE PROPIONATE 50 MCG/ACT NA SUSP
2.0000 | Freq: Every day | NASAL | Status: DC
  Administered 2019-02-16: 10:00:00 2 via NASAL
  Filled 2019-02-16: qty 16

## 2019-02-16 MED ORDER — PREDNISONE 10 MG (21) PO TBPK: ORAL_TABLET | ORAL | 0 refills | Status: DC

## 2019-02-16 MED ORDER — SODIUM CHLORIDE 0.9 % IV SOLN
500.0000 mg | INTRAVENOUS | Status: DC
  Administered 2019-02-16: 10:00:00 500 mg via INTRAVENOUS
  Filled 2019-02-16: qty 500

## 2019-02-16 NOTE — Discharge Summary (Signed)
Physician Discharge Summary  April Ellis ZOX:[id number] DOB: Feb 26, 1976 DOA: 02/15/2019  PCP: Sandrea Hughs, NP  Admit date: 02/15/2019 Discharge date: 02/16/2019  Admitted From: Home Disposition: Home  Recommendations for Outpatient Follow-up:  1. Follow up with PCP in 1-2 weeks 2. Please obtain CMP/CBC, Mag, Phos in one week 3. Repeat CXR in 3-6 weeks  4. Have outpatient sleep study to assess of OSA 5. Please follow up on the following pending results:  Home Health: No Equipment/Devices: None   Discharge Condition: Stable  CODE STATUS: FULL CODE Diet recommendation: Heart Healthy Diet  Brief/Interim Summary: HPI per Dr. Jani Gravel on 02/15/2019 April Ellis  is a 43 y.o. female, with h/o asthma, morbid obeisity apparently c/o dyspnea and wheezing for the past several days, feels like her regular asthma exacerbation, apparently went to urgent care and sent to ER due to pox 80's on RA.  Pt has slight dry cough.  Denies any covid contact.  Pt denies fever, chills, cp, palp, n/v, abd pain, diarrhea, brbpr, dysuria.  Pt takes advair at home and states that she has been compliant with medication. Thinks that the change in weather triggered this.  Pt admits to smoking as well.    In ED.  T 98.5,  P 80 R 17  Bp 140/98  Pox 98% on 2L [place], desats into 80's without o2  IMPRESSION: 1. No acute cardiopulmonary process identified. 2. Stable enlarged cardiac silhouette  SARS negative  Wbc 13.4, Hgb 10.5, Plt 341  Mcv 72.3  Rdw 21.1  Na 138 K 3.9  Bun 11, Creatinine 0.75 Glucose 100  Pt treated with prednisone 60mg  po x1 in the ED, albuterol neb without improvement in wheezing  Pt will be admitted for asthma exacerbation.   **Interim History Patient improved significantly and was no longer wheezing this morning but did have diminished breath sounds.  She ambulated without desaturating.  She will be sent home on a prednisone taper and azithromycin that was started this  morning will be changed to p.o.  We will refill her DuoNeb as patient has a nebulizer at home.  She will need to follow-up with PCP within 1 week and she is understandable and agreeable with the plan and she has been deemed medically stable to be discharged home at this time.  Discharge Diagnoses:  Active Problems:   Tobacco abuse   Iron deficiency anemia   Asthma, chronic, unspecified asthma severity, with acute exacerbation   Dyspnea  Acute hypoxic respiratory failure in the setting of acute exacerbation of asthma -Improved.  Patient no longer is on supplemental oxygen via nasal cannula and did not require oxygen on home O2 screen -Continued supplemental oxygen via nasal cannula if necessary -Continuous pulse oximetry and patient maintained O2 saturations greater than 92% -Repeat chest x-ray in the outpatient setting and follow-up with PCP and with Pulmonary in the outpatient setting   Dyspnea (acute hypoxic resp failure), Acute asthma exacerbation, improved  -Solumedrol 80mg  iv q8h changed to Sterapred Taper -Advair=> Breo 1puff qday while hospitalzed -C/w DuoNeb TID and Albuterol neb q6hprn; Will refill DuoNeb at D/C -Started Azithromycin and will write for 5 days total  -Added Flutter Valve, Incentive Spirometry, Peak Flow Monitoring and Guaifenesin 1200 mg po BID -Continue Singulair 10mg  po qday  Leukocytosis -Likely reactive and Improved -Will likely trend up in Steroid Use -WBC went from 13.4 -> 10.1 -Continue to Monitor and repeat CBC as an outpatient   Microcytic Anemia/Iron Deficiency Anemia -Patient's Hgb/Hct went from  10.5/39.4 -> 10.2/39.1 -C/w Ferrous Sulfate 325 mg po Daily -Continue to Monitor for S/Sx of Bleeding -Repeat CBC as an outpatient   Tobacco Abuse -Smoking Cessation Counseling given by Dr. Lynnda Shields and Pt counselled on smoking cessation x 3 minutes -C/w Nicotine patch 21mg  topically qday prn   Super Morbid Obesity -Estimated body mass index is 65.24  kg/m as calculated from the following:   Height as of this encounter: 5\' 2"  (1.575 m).   Weight as of this encounter: 161.8 kg. -Weight Loss and Dietary Counseling given -Will need PCP to refer to Pulmonary to r/o sleep apnea as outpatient with a Sleep Study   Hyperglycemia -Patient's Blood Sugar on Admission BMP was 100 and repeat this AM is 114 on CMP -Outpatient follow up with PCP and will need an A1c  Discharge Instructions  Discharge Instructions    Call MD for:  difficulty breathing, headache or visual disturbances   Complete by:  As directed    Call MD for:  extreme fatigue   Complete by:  As directed    Call MD for:  hives   Complete by:  As directed    Call MD for:  persistant dizziness or light-headedness   Complete by:  As directed    Call MD for:  persistant nausea and vomiting   Complete by:  As directed    Call MD for:  redness, tenderness, or signs of infection (pain, swelling, redness, odor or green/yellow discharge around incision site)   Complete by:  As directed    Call MD for:  severe uncontrolled pain   Complete by:  As directed    Call MD for:  temperature >100.4   Complete by:  As directed    Diet - low sodium heart healthy   Complete by:  As directed    Discharge instructions   Complete by:  As directed    You were cared for by a hospitalist during your hospital stay. If you have any questions about your discharge medications or the care you received while you were in the hospital after you are discharged, you can call the unit and ask to speak with the hospitalist on call if the hospitalist that took care of you is not available. Once you are discharged, your primary care physician will handle any further medical issues. Please note that NO REFILLS for any discharge medications will be authorized once you are discharged, as it is imperative that you return to your primary care physician (or establish a relationship with a primary care physician if you do  not have one) for your aftercare needs so that they can reassess your need for medications and monitor your lab values.  Follow up with PCP and Pulmonary if necessary. Take all medications as prescribed. If symptoms change or worsen please return to the ED for evaluation   Increase activity slowly   Complete by:  As directed      Allergies as of 02/16/2019      Reactions   Penicillins    Childhood Allergy Did it involve swelling of the face/tongue/throat, SOB, or low BP? UNK Did it involve sudden or severe rash/hives, skin peeling, or any reaction on the inside of your mouth or nose? UNK Did you need to seek medical attention at a hospital or doctor's office? UNK When did it last happen?more than 10 years If all above answers are "NO", may proceed with cephalosporin use.      Medication List    STOP  taking these medications   predniSONE 10 MG tablet Commonly known as:  DELTASONE Replaced by:  predniSONE 10 MG (21) Tbpk tablet     TAKE these medications   albuterol 108 (90 Base) MCG/ACT inhaler Commonly known as:  VENTOLIN HFA Inhale 1-2 puffs into the lungs every 6 (six) hours as needed for wheezing or shortness of breath.   albuterol (2.5 MG/3ML) 0.083% nebulizer solution Commonly known as:  PROVENTIL Take 6 mLs (5 mg total) by nebulization every 4 (four) hours as needed for wheezing or shortness of breath.   azithromycin 500 MG tablet Commonly known as:  Zithromax Take 1 tablet (500 mg total) by mouth daily for 4 days. Take 1 tablet daily for 3 days.   ferrous sulfate 325 (65 FE) MG EC tablet Take 325 mg by mouth daily.   fluticasone 50 MCG/ACT nasal spray Commonly known as:  FLONASE Place 2 sprays into both nostrils daily.   fluticasone-salmeterol 230-21 MCG/ACT inhaler Commonly known as:  Advair HFA Inhale 2 puffs into the lungs 2 (two) times daily.   guaiFENesin 600 MG 12 hr tablet Commonly known as:  MUCINEX Take 1 tablet (600 mg total) by mouth 2 (two)  times daily.   ipratropium-albuterol 0.5-2.5 (3) MG/3ML Soln Commonly known as:  DUONEB Take 3 mLs by nebulization every 6 (six) hours as needed.   montelukast 10 MG tablet Commonly known as:  SINGULAIR TAKE 1 TABLET (10 MG TOTAL) BY MOUTH AT BEDTIME.   nicotine 21 mg/24hr patch Commonly known as:  NICODERM CQ - dosed in mg/24 hours Place 1 patch (21 mg total) onto the skin daily as needed (smoking craving).   predniSONE 10 MG (21) Tbpk tablet Commonly known as:  STERAPRED UNI-PAK 21 TAB Take 6 tablets on day 1, 5 tablets on day 2, 4 tablets on day 3, 2 tablets on day 4, 2 tablets on day 5, 1 tablet on day 6, and stop on day 7 Replaces:  predniSONE 10 MG tablet   varenicline 0.5 MG tablet Commonly known as:  Chantix Take 0.5 mg tablet daily x 3 days then Take 0.5 mg tablet twice daily x 7 days then Take 1 mg tablet twice daily x 12 weeks       Allergies  Allergen Reactions  . Penicillins     Childhood Allergy Did it involve swelling of the face/tongue/throat, SOB, or low BP? UNK Did it involve sudden or severe rash/hives, skin peeling, or any reaction on the inside of your mouth or nose? UNK Did you need to seek medical attention at a hospital or doctor's office? UNK When did it last happen?more than 10 years If all above answers are "NO", may proceed with cephalosporin use.    Consultations:  None  Procedures/Studies: Dg Chest Portable 1 View  Result Date: 02/15/2019 CLINICAL DATA:  Asthma EXAM: PORTABLE CHEST 1 VIEW COMPARISON:  11/20/2018 FINDINGS: The cardiac silhouette remains borderline enlarged. Both lungs are clear. The visualized skeletal structures are unremarkable. IMPRESSION: 1. No acute cardiopulmonary process identified. 2. Stable enlarged cardiac silhouette Electronically Signed   By: Constance Holster M.D.   On: 02/15/2019 21:36    Subjective: Examined at bedside was doing better today.  Denies any chest pain, lightheadedness or dizziness.  No  nausea or vomiting.  Wheezing is improved and she ambulated without desaturating.  She states that she is nearly back to her baseline.  No other concerns or complaints at this time is ready to go home.  Discharge Exam: Vitals:  02/16/19 0414 02/16/19 0817  BP: 127/68   Pulse: 84   Resp: 20   Temp: 98.2 F (36.8 C)   SpO2: 94% 92%   Vitals:   02/16/19 0414 02/16/19 0500 02/16/19 0618 02/16/19 0817  BP: 127/68     Pulse: 84     Resp: 20     Temp: 98.2 F (36.8 C)     TempSrc: Oral     SpO2: 94%   92%  Weight:  (!) 161.8 kg (!) 161.8 kg   Height:   5\' 2"  (1.575 m)    General: Pt is alert, awake, not in acute distress Cardiovascular: RRR, S1/S2 +, no rubs, no gallops Respiratory: Diminished bilaterally, no wheezing, no rhonchi; Unlabored Breathing  Abdominal: Soft, NT, ND, bowel sounds + Extremities: no LE edema, no cyanosis  The results of significant diagnostics from this hospitalization (including imaging, microbiology, ancillary and laboratory) are listed below for reference.    Microbiology: Recent Results (from the past 240 hour(s))  SARS Coronavirus 2 (CEPHEID- Performed in [place] hospital lab), Hosp Order     Status: None   Collection Time: 02/15/19  9:42 PM  Result Value Ref Range Status   SARS Coronavirus 2 NEGATIVE NEGATIVE Final    Comment: (NOTE) If result is NEGATIVE SARS-CoV-2 target nucleic acids are NOT DETECTED. The SARS-CoV-2 RNA is generally detectable in upper and lower  respiratory specimens during the acute phase of infection. The lowest  concentration of SARS-CoV-2 viral copies this assay can detect is 250  copies / mL. A negative result does not preclude SARS-CoV-2 infection  and should not be used as the sole basis for treatment or other  patient management decisions.  A negative result may occur with  improper specimen collection / handling, submission of specimen other  than nasopharyngeal swab, presence of viral mutation(s) within the   areas targeted by this assay, and inadequate number of viral copies  (<250 copies / mL). A negative result must be combined with clinical  observations, patient history, and epidemiological information. If result is POSITIVE SARS-CoV-2 target nucleic acids are DETECTED. The SARS-CoV-2 RNA is generally detectable in upper and lower  respiratory specimens dur ing the acute phase of infection.  Positive  results are indicative of active infection with SARS-CoV-2.  Clinical  correlation with patient history and other diagnostic information is  necessary to determine patient infection status.  Positive results do  not rule out bacterial infection or co-infection with other viruses. If result is PRESUMPTIVE POSTIVE SARS-CoV-2 nucleic acids MAY BE PRESENT.   A presumptive positive result was obtained on the submitted specimen  and confirmed on repeat testing.  While 2019 novel coronavirus  (SARS-CoV-2) nucleic acids may be present in the submitted sample  additional confirmatory testing may be necessary for epidemiological  and / or clinical management purposes  to differentiate between  SARS-CoV-2 and other Sarbecovirus currently known to infect humans.  If clinically indicated additional testing with an alternate test  methodology [phone]) is advised. The SARS-CoV-2 RNA is generally  detectable in upper and lower respiratory sp ecimens during the acute  phase of infection. The expected result is Negative. Fact Sheet for Patients:  StrictlyIdeas.no Fact Sheet for Healthcare Providers: BankingDealers.co.za This test is not yet approved or cleared by the [place] FDA and has been authorized for detection and/or diagnosis of SARS-CoV-2 by FDA under an Emergency Use Authorization (EUA).  This EUA will remain in effect (meaning this test can be used) for the  duration of the COVID-19 declaration under Section 564(b)(1) of the Act, 21  U.S.C. section 360bbb-3(b)(1), unless the authorization is terminated or revoked sooner. Performed at [facility], [place] [address] [place], [place] [address]     Labs: BNP (last 3 results) No results for input(s): BNP in the last 8760 hours. Basic Metabolic Panel: Recent Labs  Lab 02/15/19 2142 02/16/19 0703  NA 138 137  K 3.9 4.8  CL 104 104  CO2 27 26  GLUCOSE 100* 114*  BUN 11 12  CREATININE 0.75 0.65  CALCIUM 8.9 9.1   Liver Function Tests: Recent Labs  Lab 02/16/19 0703  AST 12*  ALT 13  ALKPHOS 87  BILITOT 0.3  PROT 7.8  ALBUMIN 3.7   No results for input(s): LIPASE, AMYLASE in the last 168 hours. No results for input(s): AMMONIA in the last 168 hours. CBC: Recent Labs  Lab 02/15/19 2142 02/16/19 0703  WBC 13.4* 10.1  NEUTROABS 9.0*  --   HGB 10.5* 10.2*  HCT 39.4 39.1  MCV 72.3* 72.3*  PLT 341 352   Cardiac Enzymes: No results for input(s): CKTOTAL, CKMB, CKMBINDEX, TROPONINI in the last 168 hours. BNP: Invalid input(s): POCBNP CBG: No results for input(s): GLUCAP in the last 168 hours. D-Dimer No results for input(s): DDIMER in the last 72 hours. Hgb A1c No results for input(s): HGBA1C in the last 72 hours. Lipid Profile No results for input(s): CHOL, HDL, LDLCALC, TRIG, CHOLHDL, LDLDIRECT in the last 72 hours. Thyroid function studies No results for input(s): TSH, T4TOTAL, T3FREE, THYROIDAB in the last 72 hours.  Invalid input(s): FREET3 Anemia work up No results for input(s): VITAMINB12, FOLATE, FERRITIN, TIBC, IRON, RETICCTPCT in the last 72 hours. Urinalysis    Component Value Date/Time   COLORURINE YELLOW 04/09/2014 2007   APPEARANCEUR CLEAR 04/09/2014 2007   LABSPEC 1.015 04/09/2014 2007   PHURINE 7.0 04/09/2014 2007   GLUCOSEU NEGATIVE 04/09/2014 2007   GLUCOSEU NEGATIVE 12/19/2008 1343   HGBUR NEGATIVE 04/09/2014 2007   BILIRUBINUR NEGATIVE 04/09/2014 2007   KETONESUR NEGATIVE 04/09/2014 2007    PROTEINUR NEGATIVE 04/09/2014 2007   UROBILINOGEN 1.0 04/09/2014 2007   NITRITE NEGATIVE 04/09/2014 2007   LEUKOCYTESUR NEGATIVE 04/09/2014 2007   Sepsis Labs Invalid input(s): PROCALCITONIN,  WBC,  LACTICIDVEN Microbiology Recent Results (from the past 240 hour(s))  SARS Coronavirus 2 (CEPHEID- Performed in [place] hospital lab), Hosp Order     Status: None   Collection Time: 02/15/19  9:42 PM  Result Value Ref Range Status   SARS Coronavirus 2 NEGATIVE NEGATIVE Final    Comment: (NOTE) If result is NEGATIVE SARS-CoV-2 target nucleic acids are NOT DETECTED. The SARS-CoV-2 RNA is generally detectable in upper and lower  respiratory specimens during the acute phase of infection. The lowest  concentration of SARS-CoV-2 viral copies this assay can detect is 250  copies / mL. A negative result does not preclude SARS-CoV-2 infection  and should not be used as the sole basis for treatment or other  patient management decisions.  A negative result may occur with  improper specimen collection / handling, submission of specimen other  than nasopharyngeal swab, presence of viral mutation(s) within the  areas targeted by this assay, and inadequate number of viral copies  (<250 copies / mL). A negative result must be combined with clinical  observations, patient history, and epidemiological information. If result is POSITIVE SARS-CoV-2 target nucleic acids are DETECTED. The SARS-CoV-2 RNA is generally detectable in upper and lower  respiratory specimens dur ing the acute phase of infection.  Positive  results are indicative of active infection with SARS-CoV-2.  Clinical  correlation with patient history and other diagnostic information is  necessary to determine patient infection status.  Positive results do  not rule out bacterial infection or co-infection with other viruses. If result is PRESUMPTIVE POSTIVE SARS-CoV-2 nucleic acids MAY BE PRESENT.   A presumptive positive result was  obtained on the submitted specimen  and confirmed on repeat testing.  While 2019 novel coronavirus  (SARS-CoV-2) nucleic acids may be present in the submitted sample  additional confirmatory testing may be necessary for epidemiological  and / or clinical management purposes  to differentiate between  SARS-CoV-2 and other Sarbecovirus currently known to infect humans.  If clinically indicated additional testing with an alternate test  methodology [phone]) is advised. The SARS-CoV-2 RNA is generally  detectable in upper and lower respiratory sp ecimens during the acute  phase of infection. The expected result is Negative. Fact Sheet for Patients:  StrictlyIdeas.no Fact Sheet for Healthcare Providers: BankingDealers.co.za This test is not yet approved or cleared by the [place] FDA and has been authorized for detection and/or diagnosis of SARS-CoV-2 by FDA under an Emergency Use Authorization (EUA).  This EUA will remain in effect (meaning this test can be used) for the duration of the COVID-19 declaration under Section 564(b)(1) of the Act, 21 U.S.C. section 360bbb-3(b)(1), unless the authorization is terminated or revoked sooner. Performed at [facility], [place] [address], [place], [place] [address]    Time coordinating discharge: 35 minutes  SIGNED:  Kerney Elbe, DO [facility] 02/16/2019, 12:24 PM Pager is on [place]  If 7PM-7AM, please contact night-coverage [email or web address] Password [email or web address]

## 2019-02-16 NOTE — Telephone Encounter (Signed)
Pt is now in the hospital.  

## 2019-02-16 NOTE — Progress Notes (Signed)
SATURATION QUALIFICATIONS: (This note is used to comply with regulatory documentation for home oxygen)  Patient Saturations on Room Air at Rest = 96%  Patient Saturations on Room Air while Ambulating = 90%-95%   Please briefly explain why patient needs home oxygen: Patient does not qualify for home oxygen at this time.

## 2019-02-16 NOTE — Progress Notes (Signed)
April Ellis to be D/C'd Home per MD order.  Discussed prescriptions and follow up appointments with the patient. Prescriptions given to patient, medication list explained in detail. Pt verbalized understanding.  Allergies as of 02/16/2019      Reactions   Penicillins    Childhood Allergy Did it involve swelling of the face/tongue/throat, SOB, or low BP? UNK Did it involve sudden or severe rash/hives, skin peeling, or any reaction on the inside of your mouth or nose? UNK Did you need to seek medical attention at a hospital or doctor's office? UNK When did it last happen?more than 10 years If all above answers are "NO", may proceed with cephalosporin use.      Medication List    STOP taking these medications   predniSONE 10 MG tablet Commonly known as:  DELTASONE Replaced by:  predniSONE 10 MG (21) Tbpk tablet     TAKE these medications   albuterol 108 (90 Base) MCG/ACT inhaler Commonly known as:  VENTOLIN HFA Inhale 1-2 puffs into the lungs every 6 (six) hours as needed for wheezing or shortness of breath.   albuterol (2.5 MG/3ML) 0.083% nebulizer solution Commonly known as:  PROVENTIL Take 6 mLs (5 mg total) by nebulization every 4 (four) hours as needed for wheezing or shortness of breath.   azithromycin 500 MG tablet Commonly known as:  Zithromax Take 1 tablet (500 mg total) by mouth daily for 4 days.   ferrous sulfate 325 (65 FE) MG EC tablet Take 325 mg by mouth daily.   fluticasone 50 MCG/ACT nasal spray Commonly known as:  FLONASE Place 2 sprays into both nostrils daily.   fluticasone-salmeterol 230-21 MCG/ACT inhaler Commonly known as:  Advair HFA Inhale 2 puffs into the lungs 2 (two) times daily.   guaiFENesin 600 MG 12 hr tablet Commonly known as:  MUCINEX Take 1 tablet (600 mg total) by mouth 2 (two) times daily.   ipratropium-albuterol 0.5-2.5 (3) MG/3ML Soln Commonly known as:  DUONEB Take 3 mLs by nebulization every 6 (six) hours as needed.    montelukast 10 MG tablet Commonly known as:  SINGULAIR TAKE 1 TABLET (10 MG TOTAL) BY MOUTH AT BEDTIME.   nicotine 21 mg/24hr patch Commonly known as:  NICODERM CQ - dosed in mg/24 hours Place 1 patch (21 mg total) onto the skin daily as needed (smoking craving).   predniSONE 10 MG (21) Tbpk tablet Commonly known as:  STERAPRED UNI-PAK 21 TAB Take 6 tablets on day 1, 5 tablets on day 2, 4 tablets on day 3, 2 tablets on day 4, 2 tablets on day 5, 1 tablet on day 6, and stop on day 7 Replaces:  predniSONE 10 MG tablet   varenicline 0.5 MG tablet Commonly known as:  Chantix Take 0.5 mg tablet daily x 3 days then Take 0.5 mg tablet twice daily x 7 days then Take 1 mg tablet twice daily x 12 weeks       Vitals:   02/16/19 0414 02/16/19 0817  BP: 127/68   Pulse: 84   Resp: 20   Temp: 98.2 F (36.8 C)   SpO2: 94% 92%    Skin clean, dry and intact without evidence of skin break down, no evidence of skin tears noted. IV catheter discontinued intact. Site without signs and symptoms of complications. Dressing and pressure applied. Pt denies pain at this time. No complaints noted.  An After Visit Summary was printed and given to the patient. Patient escorted via [place], and D/C home  via private auto.  April Ellis 02/16/2019 1:44 PM

## 2019-02-17 ENCOUNTER — Other Ambulatory Visit: Payer: Self-pay | Admitting: Nurse Practitioner

## 2019-02-17 DIAGNOSIS — J45909 Unspecified asthma, uncomplicated: Secondary | ICD-10-CM

## 2019-03-06 ENCOUNTER — Other Ambulatory Visit: Payer: Self-pay

## 2019-03-06 ENCOUNTER — Ambulatory Visit (INDEPENDENT_AMBULATORY_CARE_PROVIDER_SITE_OTHER): Payer: 59 | Admitting: Family

## 2019-03-06 ENCOUNTER — Encounter: Payer: Self-pay | Admitting: Family

## 2019-03-06 DIAGNOSIS — F17200 Nicotine dependence, unspecified, uncomplicated: Secondary | ICD-10-CM | POA: Diagnosis not present

## 2019-03-06 DIAGNOSIS — J449 Chronic obstructive pulmonary disease, unspecified: Secondary | ICD-10-CM

## 2019-03-06 DIAGNOSIS — D509 Iron deficiency anemia, unspecified: Secondary | ICD-10-CM

## 2019-03-06 DIAGNOSIS — J4540 Moderate persistent asthma, uncomplicated: Secondary | ICD-10-CM

## 2019-03-06 DIAGNOSIS — D72829 Elevated white blood cell count, unspecified: Secondary | ICD-10-CM

## 2019-03-06 MED ORDER — VARENICLINE TARTRATE 0.5 MG PO TABS: ORAL_TABLET | ORAL | 0 refills | Status: DC

## 2019-03-06 MED ORDER — ALBUTEROL SULFATE HFA 108 (90 BASE) MCG/ACT IN AERS: 1.0000 | INHALATION_SPRAY | Freq: Four times a day (QID) | RESPIRATORY_TRACT | 12 refills | Status: DC | PRN

## 2019-03-06 MED ORDER — MONTELUKAST SODIUM 10 MG PO TABS: 10.0000 mg | ORAL_TABLET | Freq: Every day | ORAL | 0 refills | Status: DC

## 2019-03-06 NOTE — Progress Notes (Signed)
This service is provided via telemedicine  No vital signs collected/recorded due to the encounter was a telemedicine visit.   Location of patient (ex: home, work):  Home   Patient consents to a telephone visit:  Yes   Location of the provider (ex: office, home):  Office   Names of all persons participating in the telemedicine service and their role in the encounter: Ruthell Rummage CMA, Dinah Ngetich NP, [facility]  Time spent on call:  Ruthell Rummage CMA, spent 10 Minutes on phone with patient.        Provider: Marlowe Sax FNP-C   Goals of Care:  Advanced Directives 02/16/2019  Does Patient Have a Medical Advance Directive? -  Would patient like information on creating a medical advance directive? No - Patient declined     No chief complaint on file.   HPI: Patient is a 43 y.o. female seen today for medical management of chronic diseases.she is status post hospital admission from 02/15/2019-02/16/2019 for acute exacerbation asthma and dyspnea.Oxygen saturated was in the 80's in the ED.she was treated with prednisone 60 mg tablet x 1 dose,Albuterol Neb with much improvement of her wheezing.she was also treated with I.V solumedrol 80 mg every 8 hours then changed to tapered prednisone on discharge.she had leukocytosis WBC 13.4 -> 10.1.Her Advair was changed to [facility] during hospitalization.she was treated with azithromycin and discharged home with 5 days course.Her COVID-19 test was negative at the hospital.she was told to follow up with PCP in 1 week but did not.Repeat of CBC/diff,CMP,CXR in 3-6 weeks and refer for sleep study due to her morbid obesity recommend.  She states shortness,dyspnea and wheezing have resolved.she continues to smoking cigarettes though states has cut down a lot smokes about 5 cigarettes a day.she request chantix refill.      Past Medical History:  Diagnosis Date  . Anemia   . Asthma   . Bronchitis   . COPD (chronic obstructive pulmonary disease)  ([place])   . Obesity     Past Surgical History:  Procedure Laterality Date  . DENTAL SURGERY     2 teeth pulled, 11/22/14  . NO PAST SURGERIES      Allergies  Allergen Reactions  . Penicillins     Childhood Allergy Did it involve swelling of the face/tongue/throat, SOB, or low BP? UNK Did it involve sudden or severe rash/hives, skin peeling, or any reaction on the inside of your mouth or nose? UNK Did you need to seek medical attention at a hospital or doctor's office? UNK When did it last happen?more than 10 years If all above answers are "NO", may proceed with cephalosporin use.     Outpatient Encounter Medications as of 03/06/2019  Medication Sig  . ADVAIR HFA 230-21 MCG/ACT inhaler TAKE 2 PUFFS BY MOUTH TWICE A DAY  . albuterol (PROVENTIL) (2.5 MG/3ML) 0.083% nebulizer solution Take 6 mLs (5 mg total) by nebulization every 4 (four) hours as needed for wheezing or shortness of breath.  Marland Kitchen albuterol (VENTOLIN HFA) 108 (90 Base) MCG/ACT inhaler Inhale 1-2 puffs into the lungs every 6 (six) hours as needed for wheezing or shortness of breath.  . ferrous sulfate 325 (65 FE) MG EC tablet Take 325 mg by mouth daily.  . fluticasone (FLONASE) 50 MCG/ACT nasal spray Place 2 sprays into both nostrils daily.  Marland Kitchen guaiFENesin (MUCINEX) 600 MG 12 hr tablet Take 1 tablet (600 mg total) by mouth 2 (two) times daily.  Marland Kitchen ipratropium-albuterol (DUONEB) 0.5-2.5 (3) MG/3ML SOLN Take  3 mLs by nebulization every 6 (six) hours as needed.  . montelukast (SINGULAIR) 10 MG tablet TAKE 1 TABLET (10 MG TOTAL) BY MOUTH AT BEDTIME.  . nicotine (NICODERM CQ - DOSED IN MG/24 HOURS) 21 mg/24hr patch Place 1 patch (21 mg total) onto the skin daily as needed (smoking craving).  . predniSONE (STERAPRED UNI-PAK 21 TAB) 10 MG (21) TBPK tablet Take 6 tablets on day 1, 5 tablets on day 2, 4 tablets on day 3, 2 tablets on day 4, 2 tablets on day 5, 1 tablet on day 6, and stop on day 7  . varenicline (CHANTIX) 0.5 MG  tablet Take 0.5 mg tablet daily x 3 days then Take 0.5 mg tablet twice daily x 7 days then Take 1 mg tablet twice daily x 12 weeks (Patient not taking: Reported on 02/15/2019)   No facility-administered encounter medications on file as of 03/06/2019.     Review of Systems:  Review of Systems  Constitutional: Negative for appetite change, chills, fatigue and fever.  HENT: Negative for congestion, rhinorrhea, sinus pressure, sinus pain, sneezing and sore throat.   Eyes: Negative for discharge, redness, itching and visual disturbance.  Respiratory: Negative for cough, chest tightness, shortness of breath and wheezing.   Cardiovascular: Negative for chest pain, palpitations and leg swelling.  Gastrointestinal: Negative for abdominal distention, abdominal pain, constipation, diarrhea, nausea and vomiting.  Endocrine: Negative for cold intolerance, heat intolerance, polydipsia, polyphagia and polyuria.  Genitourinary: Negative for difficulty urinating, dysuria, flank pain, frequency and urgency.  Musculoskeletal: Negative for arthralgias and gait problem.  Skin: Negative for color change, pallor and rash.  Neurological: Negative for dizziness, light-headedness and headaches.  Hematological: Does not bruise/bleed easily.  Psychiatric/Behavioral: Negative for agitation, confusion and sleep disturbance. The patient is not nervous/anxious.     Health Maintenance  Topic Date Due  . TETANUS/TDAP  12/20/2018  . INFLUENZA VACCINE  12/26/2019 (Originally 04/28/2019)  . PAP SMEAR-Modifier  12/28/2019  . HIV Screening  Completed    Physical Exam: There were no vitals filed for this visit. There is no height or weight on file to calculate BMI. Physical Exam  Unable to complete on Telephone visit  Labs reviewed: Basic Metabolic Panel: Recent Labs    11/21/18 0621 02/15/19 2142 02/16/19 0703  NA 136 138 137  K 4.4 3.9 4.8  CL 105 104 104  CO2 '26 27 26  ' GLUCOSE 142* 100* 114*  BUN '12 11 12   ' CREATININE 0.60 0.75 0.65  CALCIUM 8.6* 8.9 9.1  TSH 0.470  --   --    Liver Function Tests: Recent Labs    11/08/18 0434 11/21/18 0621 02/16/19 0703  AST 20 18 12*  ALT '23 25 13  ' ALKPHOS 88 71 87  BILITOT 0.6 0.3 0.3  PROT 8.5* 7.3 7.8  ALBUMIN 3.7 3.1* 3.7   CBC: Recent Labs    11/08/18 0434 11/20/18 1436 11/21/18 0621 02/15/19 2142 02/16/19 0703  WBC 11.8* 14.5* 14.6* 13.4* 10.1  NEUTROABS 10.5* 13.7*  --  9.0*  --   HGB 8.6* 8.5* 8.1* 10.5* 10.2*  HCT 31.1* 31.8* 31.5* 39.4 39.1  MCV 76.0* 75.5* 75.5* 72.3* 72.3*  PLT 375 445* 445* 341 352    Lab Results  Component Value Date   HGBA1C 5.0 07/14/2016    Procedures since last visit: Dg Chest Portable 1 View  Result Date: 02/15/2019 CLINICAL DATA:  Asthma EXAM: PORTABLE CHEST 1 VIEW COMPARISON:  11/20/2018 FINDINGS: The cardiac silhouette remains borderline enlarged.  Both lungs are clear. The visualized skeletal structures are unremarkable. IMPRESSION: 1. No acute cardiopulmonary process identified. 2. Stable enlarged cardiac silhouette Electronically Signed   By: Constance Holster M.D.   On: 02/15/2019 21:36    Assessment/Plan 1. Moderate persistent asthma without complication Status post hospital admission with exacerbation.shortness,dyspnea and wheezing have resolved. - albuterol (VENTOLIN HFA) 108 (90 Base) MCG/ACT inhaler; Inhale 1-2 puffs into the lungs every 6 (six) hours as needed for wheezing or shortness of breath.  Dispense: 1 Inhaler; Refill: 12 - montelukast (SINGULAIR) 10 MG tablet; Take 1 tablet (10 mg total) by mouth at bedtime.  Dispense: 90 tablet; Refill: 0 - DG Chest 2 View; Future  2. Smoking Smoking cessation advised.Education smoking information send on mail with After visit summary.  - varenicline (CHANTIX) 0.5 MG tablet; Take 0.5 mg tablet daily x 3 days then Take 0.5 mg tablet twice daily x 7 days then Take 1 mg tablet twice daily x 12 weeks  Dispense: 21 tablet; Refill: 0  3. Morbid  obesity (HCC) Low carbohydrate,low saturated fats,high vegetable diet and exercise at least x 3 per week for at least 30 minutes.DASH diet information send on mail with After visit summary. - Refer to pulmonary for sleep study rule out sleep apnea.   4. Chronic obstructive pulmonary disease, unspecified COPD type ([place]) Continue on advair,Albuterol and Duoneb and Singulair. - CMP with eGFR([facility]) - DG Chest 2 View; Future  5. Iron deficiency anemia, unspecified iron deficiency anemia type Hgb 10.5 -> 10.2 during hospital admission.Will recheck H/H. - CBC with Differential/Platelet; Future  6. Leukocytosis  WBC 13.4 ->10.1 during hospitalization.Has completed Azithromycin as prescribed from the hospital.No fever or chills reported. Will recheck CBC/diff.  7.Hypergylcemia  Glucose was 100-> 114 in the hospital.Thinks possible due to prednisone.will repeat CMP if glucose remains high will add on Hgb A1C to labs.discussed low carbo,low saturated fats,high vegetables and cut down on concentrated sweets/drinks and snacks.    Labs/tests ordered: CBC/diff,CMP,CXR 2 views  Next appt:  3 months.  Time spent with patient 21 minutes >50% time spent counseling; reviewing medical record; tests; labs; and developing future plan of care

## 2019-03-06 NOTE — Patient Instructions (Addendum)
1. Referral placed to pulmonary for sleep study. 2. Chest X-ray ordered to be done 03/22/2019 or sooner at [facility] for follow up Hospital admission for dyspnea.  3. Smoking cessation   Smoking Tobacco Information, Adult Smoking tobacco can be harmful to your health. Tobacco contains a poisonous (toxic), colorless chemical called nicotine. Nicotine is addictive. It changes the brain and can make it hard to stop smoking. Tobacco also has other toxic chemicals that can hurt your body and raise your risk of many cancers. How can smoking tobacco affect me? Smoking tobacco puts you at risk for:  Cancer. Smoking is most commonly associated with lung cancer, but can also lead to cancer in other parts of the body.  Chronic obstructive pulmonary disease (COPD). This is a long-term lung condition that makes it hard to breathe. It also gets worse over time.  High blood pressure (hypertension), heart disease, stroke, or heart attack.  Lung infections, such as pneumonia.  Cataracts. This is when the lenses in the eyes become clouded.  Digestive problems. This may include peptic ulcers, heartburn, and gastroesophageal reflux disease (GERD).  Oral health problems, such as gum disease and tooth loss.  Loss of taste and smell. Smoking can affect your appearance by causing:  Wrinkles.  Yellow or stained teeth, fingers, and fingernails. Smoking tobacco can also affect your social life, because:  It may be challenging to find places to smoke when away from home. Many workplaces, [employer], hotels, and public places are tobacco-free.  Smoking is expensive. This is due to the cost of tobacco and the long-term costs of treating health problems from smoking.  Secondhand smoke may affect those around you. Secondhand smoke can cause lung cancer, breathing problems, and heart disease. Children of smokers have a higher risk for: ? Sudden infant death syndrome (SIDS). ? Ear infections. ? Lung  infections. If you currently smoke tobacco, quitting now can help you:  Lead a longer and healthier life.  Look, smell, breathe, and feel better over time.  Save money.  Protect others from the harms of secondhand smoke. What actions can I take to prevent health problems? Quit smoking   Do not start smoking. Quit if you already do.  Make a plan to quit smoking and commit to it. Look for programs to help you and ask your health care provider for recommendations and ideas.  Set a date and write down all the reasons you want to quit.  Let your friends and family know you are quitting so they can help and support you. Consider finding friends who also want to quit. It can be easier to quit with someone else, so that you can support each other.  Talk with your health care provider about using nicotine replacement medicines to help you quit, such as gum, lozenges, patches, sprays, or pills.  Do not replace cigarette smoking with electronic cigarettes, which are commonly called e-cigarettes. The safety of e-cigarettes is not known, and some may contain harmful chemicals.  If you try to quit but return to smoking, stay positive. It is common to slip up when you first quit, so take it one day at a time.  Be prepared for cravings. When you feel the urge to smoke, chew gum or suck on hard candy. Lifestyle  Stay busy and take care of your body.  Drink enough fluid to keep your urine pale yellow.  Get plenty of exercise and eat a healthy diet. This can help prevent weight gain after quitting.  Monitor  your eating habits. Quitting smoking can cause you to have a larger appetite than when you smoke.  Find ways to relax. Go out with friends or family to a movie or a restaurant where people do not smoke.  Ask your health care provider about having regular tests (screenings) to check for cancer. This may include blood tests, imaging tests, and other tests.  Find ways to manage your stress,  such as meditation, yoga, or exercise. Where to find support To get support to quit smoking, consider:  Asking your health care provider for more information and resources.  Taking classes to learn more about quitting smoking.  Looking for local organizations that offer resources about quitting smoking.  Joining a support group for people who want to quit smoking in your local community.  Calling the smokefree.gov counselor helpline: 1-800-Quit-Now 276-546-4271) Where to find more information You may find more information about quitting smoking from:  HelpGuide.org: www.helpguide.org  https://hall.com/: smokefree.gov  American Lung Association: www.lung.org Contact a health care provider if you:  Have problems breathing.  Notice that your lips, nose, or fingers turn blue.  Have chest pain.  Are coughing up blood.  Feel faint or you pass out.  Have other health changes that cause you to worry. Summary  Smoking tobacco can negatively affect your health, the health of those around you, your finances, and your social life.  Do not start smoking. Quit if you already do. If you need help quitting, ask your health care provider.  Think about joining a support group for people who want to quit smoking in your local community. There are many effective programs that will help you to quit this behavior. This information is not intended to replace advice given to you by your health care provider. Make sure you discuss any questions you have with your health care provider. Document Released: 09/28/2016 Document Revised: 11/02/2017 Document Reviewed: 09/28/2016 Elsevier Interactive Patient Education  2019 [place] DASH stands for "Dietary Approaches to Stop Hypertension." The DASH eating plan is a healthy eating plan that has been shown to reduce high blood pressure (hypertension). It may also reduce your risk for type 2 diabetes, heart disease, and stroke. The  DASH eating plan may also help with weight loss. What are tips for following this plan?  General guidelines  Avoid eating more than 2,300 mg (milligrams) of salt (sodium) a day. If you have hypertension, you may need to reduce your sodium intake to 1,500 mg a day.  Limit alcohol intake to no more than 1 drink a day for nonpregnant women and 2 drinks a day for men. One drink equals 12 oz of beer, 5 oz of wine, or 1 oz of hard liquor.  Work with your health care provider to maintain a healthy body weight or to lose weight. Ask what an ideal weight is for you.  Get at least 30 minutes of exercise that causes your heart to beat faster (aerobic exercise) most days of the week. Activities may include walking, swimming, or biking.  Work with your health care provider or diet and nutrition specialist (dietitian) to adjust your eating plan to your individual calorie needs. Reading food labels   Check food labels for the amount of sodium per serving. Choose foods with less than 5 percent of the Daily Value of sodium. Generally, foods with less than 300 mg of sodium per serving fit into this eating plan.  To find whole grains, look for the word "whole" as  the first word in the ingredient list. Shopping  Buy products labeled as "low-sodium" or "no salt added."  Buy fresh foods. Avoid canned foods and premade or frozen meals. Cooking  Avoid adding salt when cooking. Use salt-free seasonings or herbs instead of table salt or sea salt. Check with your health care provider or pharmacist before using salt substitutes.  Do not fry foods. Cook foods using healthy methods such as baking, boiling, grilling, and broiling instead.  Cook with heart-healthy oils, such as olive, canola, soybean, or sunflower oil. Meal planning  Eat a balanced diet that includes: ? 5 or more servings of fruits and vegetables each day. At each meal, try to fill half of your plate with fruits and vegetables. ? Up to 6-8  servings of whole grains each day. ? Less than 6 oz of lean meat, poultry, or fish each day. A 3-oz serving of meat is about the same size as a deck of cards. One egg equals 1 oz. ? 2 servings of low-fat dairy each day. ? A serving of nuts, seeds, or beans 5 times each week. ? Heart-healthy fats. Healthy fats called Omega-3 fatty acids are found in foods such as flaxseeds and coldwater fish, like sardines, salmon, and mackerel.  Limit how much you eat of the following: ? Canned or prepackaged foods. ? Food that is high in trans fat, such as fried foods. ? Food that is high in saturated fat, such as fatty meat. ? Sweets, desserts, sugary drinks, and other foods with added sugar. ? Full-fat dairy products.  Do not salt foods before eating.  Try to eat at least 2 vegetarian meals each week.  Eat more home-cooked food and less restaurant, buffet, and fast food.  When eating at a restaurant, ask that your food be prepared with less salt or no salt, if possible. What foods are recommended? The items listed may not be a complete list. Talk with your dietitian about what dietary choices are best for you. Grains Whole-grain or whole-wheat bread. Whole-grain or whole-wheat pasta. Brown rice. [place]. Bulgur. Whole-grain and low-sodium cereals. Pita bread. Low-fat, low-sodium crackers. Whole-wheat flour tortillas. Vegetables Fresh or frozen vegetables (raw, steamed, roasted, or grilled). Low-sodium or reduced-sodium tomato and vegetable juice. Low-sodium or reduced-sodium tomato sauce and tomato paste. Low-sodium or reduced-sodium canned vegetables. Fruits All fresh, dried, or frozen fruit. Canned fruit in natural juice (without added sugar). Meat and other protein foods Skinless chicken or [place]. Ground chicken or [place]. Pork with fat trimmed off. Fish and seafood. Egg whites. Dried beans, peas, or lentils. Unsalted nuts, nut butters, and seeds. Unsalted canned beans. Lean cuts of beef  with fat trimmed off. Low-sodium, lean deli meat. Dairy Low-fat (1%) or fat-free (skim) milk. Fat-free, low-fat, or reduced-fat cheeses. Nonfat, low-sodium ricotta or cottage cheese. Low-fat or nonfat yogurt. Low-fat, low-sodium cheese. Fats and oils Soft margarine without trans fats. Vegetable oil. Low-fat, reduced-fat, or light mayonnaise and salad dressings (reduced-sodium). Canola, safflower, olive, soybean, and sunflower oils. Avocado. Seasoning and other foods Herbs. Spices. Seasoning mixes without salt. Unsalted popcorn and pretzels. Fat-free sweets. What foods are not recommended? The items listed may not be a complete list. Talk with your dietitian about what dietary choices are best for you. Grains Baked goods made with fat, such as croissants, muffins, or some breads. Dry pasta or rice meal packs. Vegetables Creamed or fried vegetables. Vegetables in a cheese sauce. Regular canned vegetables (not low-sodium or reduced-sodium). Regular canned tomato sauce and paste (not  low-sodium or reduced-sodium). Regular tomato and vegetable juice (not low-sodium or reduced-sodium). Angie Fava. Olives. Fruits Canned fruit in a light or heavy syrup. Fried fruit. Fruit in cream or butter sauce. Meat and other protein foods Fatty cuts of meat. Ribs. Fried meat. Berniece Salines. Sausage. Bologna and other processed lunch meats. Salami. Fatback. Hotdogs. Bratwurst. Salted nuts and seeds. Canned beans with added salt. Canned or smoked fish. Whole eggs or egg yolks. Chicken or [place] with skin. Dairy Whole or 2% milk, cream, and half-and-half. Whole or full-fat cream cheese. Whole-fat or sweetened yogurt. Full-fat cheese. Nondairy creamers. Whipped toppings. Processed cheese and cheese spreads. Fats and oils Butter. Stick margarine. Lard. Shortening. Ghee. Bacon fat. Tropical oils, such as coconut, palm kernel, or palm oil. Seasoning and other foods Salted popcorn and pretzels. Onion salt, garlic salt, seasoned salt,  table salt, and sea salt. Worcestershire sauce. Tartar sauce. Barbecue sauce. Teriyaki sauce. Soy sauce, including reduced-sodium. Steak sauce. Canned and packaged gravies. Fish sauce. Oyster sauce. Cocktail sauce. Horseradish that you find on the shelf. Ketchup. Mustard. Meat flavorings and tenderizers. Bouillon cubes. Hot sauce and Tabasco sauce. Premade or packaged marinades. Premade or packaged taco seasonings. Relishes. Regular salad dressings. Where to find more information:  National Heart, Lung, and [place]: https://wilson-eaton.com/  American Heart Association: www.heart.org Summary  The DASH eating plan is a healthy eating plan that has been shown to reduce high blood pressure (hypertension). It may also reduce your risk for type 2 diabetes, heart disease, and stroke.  With the DASH eating plan, you should limit salt (sodium) intake to 2,300 mg a day. If you have hypertension, you may need to reduce your sodium intake to 1,500 mg a day.  When on the DASH eating plan, aim to eat more fresh fruits and vegetables, whole grains, lean proteins, low-fat dairy, and heart-healthy fats.  Work with your health care provider or diet and nutrition specialist (dietitian) to adjust your eating plan to your individual calorie needs. This information is not intended to replace advice given to you by your health care provider. Make sure you discuss any questions you have with your health care provider. Document Released: 09/02/2011 Document Revised: 09/06/2016 Document Reviewed: 09/06/2016 Elsevier Interactive Patient Education  2019 [employer].

## 2019-03-13 ENCOUNTER — Other Ambulatory Visit: Payer: 59

## 2019-03-21 ENCOUNTER — Telehealth: Payer: Self-pay | Admitting: Internal Medicine

## 2019-03-21 MED ORDER — PREDNISONE 10 MG PO TABS: ORAL_TABLET | ORAL | 0 refills | Status: DC

## 2019-03-21 NOTE — Telephone Encounter (Signed)
Suggest prednisone 10 mg, # 20, 4 X 2 DAYS, 3 X 2 DAYS, 2 X 2 DAYS, 1 X 2 DAYS  Please make her an ov (not tele) to see an NP in next 1-2 weeks

## 2019-03-21 NOTE — Telephone Encounter (Signed)
Called and spoke with pt letting her know that we were going to send in pred taper Rx to pharmacy for her. Also stated that we needed to get her scheduled for OV in 1-2 weeks with APP to further evaluate after prednisone and pt verbalized understanding. Verified pt's preferred pharmacy and sent Rx in for pt and also scheduled pt appt with Derl Barrow 7/8 at 9am. Nothing further needed.

## 2019-03-21 NOTE — Telephone Encounter (Signed)
Called and spoke with pt who stated she has been having trouble with her breathing since the weather change but stated it has really been worse x1 week. Pt is having to use her rescue inhaler about 5-6 times daily and has had to use nebulizer about 3-4 times daily.  Pt is still using advair and singulair as prescribed.  Pt has not had any fever as temp has been 98. Pt was recently in the hospital and was tested for COVID 5/21 which came back negative.  Pt is wanting recommendations to help with her symptoms if something could be prescribed or if she needs to come in for a visit. Dr. Annamaria Boots, please advise on this for pt. Thanks!  Allergies  Allergen Reactions  . Penicillins     Childhood Allergy Did it involve swelling of the face/tongue/throat, SOB, or low BP? UNK Did it involve sudden or severe rash/hives, skin peeling, or any reaction on the inside of your mouth or nose? UNK Did you need to seek medical attention at a hospital or doctor's office? UNK When did it last happen?more than 10 years If all above answers are "NO", may proceed with cephalosporin use.      Current Outpatient Medications:  .  ADVAIR HFA 230-21 MCG/ACT inhaler, TAKE 2 PUFFS BY MOUTH TWICE A DAY, Disp: 36 Inhaler, Rfl: 2 .  albuterol (PROVENTIL) (2.5 MG/3ML) 0.083% nebulizer solution, Take 6 mLs (5 mg total) by nebulization every 4 (four) hours as needed for wheezing or shortness of breath., Disp: 75 mL, Rfl: 0 .  albuterol (VENTOLIN HFA) 108 (90 Base) MCG/ACT inhaler, Inhale 1-2 puffs into the lungs every 6 (six) hours as needed for wheezing or shortness of breath., Disp: 1 Inhaler, Rfl: 12 .  ferrous sulfate 325 (65 FE) MG EC tablet, Take 325 mg by mouth daily., Disp: , Rfl:  .  ipratropium-albuterol (DUONEB) 0.5-2.5 (3) MG/3ML SOLN, Take 3 mLs by nebulization every 6 (six) hours as needed., Disp: 360 mL, Rfl: 0 .  montelukast (SINGULAIR) 10 MG tablet, Take 1 tablet (10 mg total) by mouth at bedtime., Disp: 90  tablet, Rfl: 0 .  nicotine (NICODERM CQ - DOSED IN MG/24 HOURS) 21 mg/24hr patch, Place 1 patch (21 mg total) onto the skin daily as needed (smoking craving)., Disp: 28 patch, Rfl: 0 .  varenicline (CHANTIX) 0.5 MG tablet, Take 0.5 mg tablet daily x 3 days then Take 0.5 mg tablet twice daily x 7 days then Take 1 mg tablet twice daily x 12 weeks, Disp: 21 tablet, Rfl: 0

## 2019-03-29 ENCOUNTER — Other Ambulatory Visit: Payer: 59

## 2019-03-30 ENCOUNTER — Other Ambulatory Visit: Payer: Self-pay | Admitting: Family

## 2019-03-30 DIAGNOSIS — F17200 Nicotine dependence, unspecified, uncomplicated: Secondary | ICD-10-CM

## 2019-04-04 ENCOUNTER — Ambulatory Visit (INDEPENDENT_AMBULATORY_CARE_PROVIDER_SITE_OTHER): Payer: 59 | Admitting: Primary Care

## 2019-04-04 ENCOUNTER — Encounter: Payer: Self-pay | Admitting: Primary Care

## 2019-04-04 ENCOUNTER — Ambulatory Visit (INDEPENDENT_AMBULATORY_CARE_PROVIDER_SITE_OTHER): Payer: 59

## 2019-04-04 ENCOUNTER — Other Ambulatory Visit: Payer: Self-pay

## 2019-04-04 VITALS — BP 122/76 | HR 84 | Temp 98.0°F | Ht 61.0 in | Wt 355.0 lb

## 2019-04-04 DIAGNOSIS — R0602 Shortness of breath: Secondary | ICD-10-CM

## 2019-04-04 DIAGNOSIS — R400 Somnolence: Secondary | ICD-10-CM | POA: Diagnosis not present

## 2019-04-04 LAB — CBC WITH DIFFERENTIAL/PLATELET
Basophils Absolute: 0.1 10*3/uL (ref 0.0–0.1)
Basophils Relative: 0.6 % (ref 0.0–3.0)
Eosinophils Absolute: 0 10*3/uL (ref 0.0–0.7)
Eosinophils Relative: 0.1 % (ref 0.0–5.0)
HCT: 37.1 % (ref 36.0–46.0)
Hemoglobin: 11 g/dL — ABNORMAL LOW (ref 12.0–15.0)
Lymphocytes Relative: 9.6 % — ABNORMAL LOW (ref 12.0–46.0)
Lymphs Abs: 1 10*3/uL (ref 0.7–4.0)
MCHC: 29.8 g/dL — ABNORMAL LOW (ref 30.0–36.0)
MCV: 67.7 fl — ABNORMAL LOW (ref 78.0–100.0)
Monocytes Absolute: 0.2 10*3/uL (ref 0.1–1.0)
Monocytes Relative: 1.8 % — ABNORMAL LOW (ref 3.0–12.0)
Neutro Abs: 8.9 10*3/uL — ABNORMAL HIGH (ref 1.4–7.7)
Neutrophils Relative %: 87.9 % — ABNORMAL HIGH (ref 43.0–77.0)
Platelets: 358 10*3/uL (ref 150.0–400.0)
RBC: 5.48 Mil/uL — ABNORMAL HIGH (ref 3.87–5.11)
RDW: 20.9 % — ABNORMAL HIGH (ref 11.5–15.5)
WBC: 10.2 10*3/uL (ref 4.0–10.5)

## 2019-04-04 LAB — BRAIN NATRIURETIC PEPTIDE: Pro B Natriuretic peptide (BNP): 41 pg/mL (ref 0.0–100.0)

## 2019-04-04 LAB — BASIC METABOLIC PANEL
BUN: 8 mg/dL (ref 6–23)
CO2: 27 mEq/L (ref 19–32)
Calcium: 8.9 mg/dL (ref 8.4–10.5)
Chloride: 103 mEq/L (ref 96–112)
Creatinine, Ser: 0.74 mg/dL (ref 0.40–1.20)
GFR: 104.19 mL/min (ref >60.00)
Glucose, Bld: 122 mg/dL — ABNORMAL HIGH (ref 70–99)
Potassium: 4.2 mEq/L (ref 3.5–5.1)
Sodium: 137 mEq/L (ref 135–145)

## 2019-04-04 MED ORDER — SPIRIVA RESPIMAT 1.25 MCG/ACT IN AERS: 2.0000 | INHALATION_SPRAY | Freq: Every day | RESPIRATORY_TRACT | 0 refills | Status: DC

## 2019-04-04 MED ORDER — PREDNISONE 10 MG PO TABS: ORAL_TABLET | ORAL | 0 refills | Status: DC

## 2019-04-04 NOTE — Assessment & Plan Note (Addendum)
-   COPD with asthma overlap- frequent exacerbations  - Continue Advair hfa  twice daily - Add spiriva respimat 1.29mcg  - Needs repeat PFTs

## 2019-04-04 NOTE — Assessment & Plan Note (Addendum)
-   Eos absolute 1.1 in May  - Checking labs (cbc w/diff, bnp, bmet, IgE) - Consideration for biologic if patient is candidate  - RX prednisone taper (on hand for severe exacerbation)

## 2019-04-04 NOTE — Progress Notes (Signed)
@Patient  ID: April Ellis, female    DOB: May 05, 1976, 43 y.o.   MRN: [id number]  Chief Complaint  Patient presents with  . Follow-up    Per patient, her breathing is slowly getting better. Still having more episodes of SOB.     Referring provider: Ngetich, Nelda Bucks, NP  HPI: 43 year female, current smoker. PMH COPD, moderate persistent asthma, chronic rhinitis, obesity, tobacco abuse. Patient of Dr. Annamaria Boots, last seen on on 01/12/19. Recent hospital admission 5/21-5/22 for acute respiratory failure. Called office on 03/21/19 with complaints of trouble breathing, sent in prescription for prednisone taper. Eos absolute 1.1 in May.   04/04/2019 Patient presents today for 1-2 week follow-up from most recent asthma exacerbation. Feeling ok today, states that she has good and bad days. She had an asthma flare again on Saturday 7/4 while inside eating breakfast. She took a generic allergy pill which did not help. Reports ear itching followed by sob/wheezing. Denies food allergies. EMS came and she was given duoneb treatment. She is maintained on Advair hfa 23/21, Singulair and prn duonebs. She is still smoking 1/2 ppd. She quit for two years in the past once. Has tried chantix and patches. Needs sleep study for OSA eval, high risk d.t weight. Reports daytime fatigue. Wakes up 1-2 times at night. No known snoring or apnea. Epworth score 22.    PFTs 2015 - FVC 2.47 (84%), FEV1 1.46 (59%), ratio 59 Severe obstruction   Allergies  Allergen Reactions  . Penicillins     Childhood Allergy Did it involve swelling of the face/tongue/throat, SOB, or low BP? UNK Did it involve sudden or severe rash/hives, skin peeling, or any reaction on the inside of your mouth or nose? UNK Did you need to seek medical attention at a hospital or doctor's office? UNK When did it last happen?more than 10 years If all above answers are "NO", may proceed with cephalosporin use.     Immunization History  Administered  Date(s) Administered  . Td 12/19/2008    Past Medical History:  Diagnosis Date  . Anemia   . Asthma   . Bronchitis   . COPD (chronic obstructive pulmonary disease) ([place])   . Obesity     Tobacco History: Social History   Tobacco Use  Smoking Status Current Every Day Smoker  . Packs/day: 0.25  . Years: 15.00  . Pack years: 3.75  . Types: Cigarettes  . Last attempt to quit: 09/16/2014  . Years since quitting: 4.5  Smokeless Tobacco Never Used  Tobacco Comment   smoking 2-3 ciggs per day/taking Chantix 01/12/19   Ready to quit: Not Answered Counseling given: Not Answered Comment: smoking 2-3 ciggs per day/taking Chantix 01/12/19   Outpatient Medications Prior to Visit  Medication Sig Dispense Refill  . ADVAIR HFA 230-21 MCG/ACT inhaler TAKE 2 PUFFS BY MOUTH TWICE A DAY 36 Inhaler 2  . albuterol (PROVENTIL) (2.5 MG/3ML) 0.083% nebulizer solution Take 6 mLs (5 mg total) by nebulization every 4 (four) hours as needed for wheezing or shortness of breath. 75 mL 0  . albuterol (VENTOLIN HFA) 108 (90 Base) MCG/ACT inhaler Inhale 1-2 puffs into the lungs every 6 (six) hours as needed for wheezing or shortness of breath. 1 Inhaler 12  . ferrous sulfate 325 (65 FE) MG EC tablet Take 325 mg by mouth daily.    Marland Kitchen ipratropium-albuterol (DUONEB) 0.5-2.5 (3) MG/3ML SOLN Take 3 mLs by nebulization every 6 (six) hours as needed. 360 mL 0  . montelukast (SINGULAIR)  10 MG tablet Take 1 tablet (10 mg total) by mouth at bedtime. 90 tablet 0  . nicotine (NICODERM CQ - DOSED IN MG/24 HOURS) 21 mg/24hr patch Place 1 patch (21 mg total) onto the skin daily as needed (smoking craving). 28 patch 0  . varenicline (CHANTIX) 0.5 MG tablet TAKE 0.5 MG TABLET DAILY X 3 DAYS THEN TAKE 0.5 MG TABLET TWICE DAILY X 7 DAYS THEN TAKE 1 MG TABLET TWICE DAILY X 12 WEEKS 60 tablet 2  . predniSONE (DELTASONE) 10 MG tablet Take 4tabsx2days,3tabsx2days,2tabsx2days,1tabx2days,then stop 20 tablet 0   No  facility-administered medications prior to visit.    Review of Systems  Review of Systems  Constitutional: Negative.   HENT: Negative.   Respiratory: Positive for shortness of breath and wheezing. Negative for cough.   Cardiovascular: Negative.    Physical Exam  BP 122/76 (BP Location: Left Arm, Patient Position: Sitting, Cuff Size: Normal)   Pulse 84   Temp 98 F (36.7 C) (Oral)   Ht 5\' 1"  (1.549 m)   Wt (!) 355 lb (161 kg)   SpO2 98%   BMI 67.08 kg/m  Physical Exam Constitutional:      General: She is not in acute distress.    Appearance: Normal appearance. She is obese. She is not ill-appearing.  HENT:     Head: Normocephalic and atraumatic.     Mouth/Throat:     Mouth: Mucous membranes are moist.     Pharynx: Oropharynx is clear.  Neck:     Musculoskeletal: Normal range of motion and neck supple.     Comments: Thick neck  Cardiovascular:     Rate and Rhythm: Normal rate and regular rhythm.  Pulmonary:     Effort: Pulmonary effort is normal. No respiratory distress.     Breath sounds: Normal breath sounds. No wheezing or rhonchi.  Musculoskeletal: Normal range of motion.  Skin:    General: Skin is warm and dry.  Neurological:     General: No focal deficit present.     Mental Status: She is alert and oriented to person, place, and time. Mental status is at baseline.  Psychiatric:        Mood and Affect: Mood normal.        Behavior: Behavior normal.        Thought Content: Thought content normal.        Judgment: Judgment normal.      Lab Results:  CBC    Component Value Date/Time   WBC 10.1 02/16/2019 0703   RBC 5.41 (H) 02/16/2019 0703   HGB 10.2 (L) 02/16/2019 0703   HCT 39.1 02/16/2019 0703   PLT 352 02/16/2019 0703   MCV 72.3 (L) 02/16/2019 0703   MCV 75.5 (A) 11/18/2014 2014   MCH 18.9 (L) 02/16/2019 0703   MCHC 26.1 (L) 02/16/2019 0703   RDW 21.0 (H) 02/16/2019 0703   LYMPHSABS 2.3 02/15/2019 2142   MONOABS 0.9 02/15/2019 2142   EOSABS 1.1  (H) 02/15/2019 2142   BASOSABS 0.1 02/15/2019 2142    BMET    Component Value Date/Time   NA 137 02/16/2019 0703   K 4.8 02/16/2019 0703   CL 104 02/16/2019 0703   CO2 26 02/16/2019 0703   GLUCOSE 114 (H) 02/16/2019 0703   BUN 12 02/16/2019 0703   CREATININE 0.65 02/16/2019 0703   CREATININE 0.71 03/23/2017 1110   CALCIUM 9.1 02/16/2019 0703   GFRNONAA >60 02/16/2019 0703   GFRNONAA >89 03/23/2017 1110   GFRAA >  60 02/16/2019 0703   GFRAA >89 03/23/2017 1110    BNP No results found for: BNP  ProBNP No results found for: PROBNP  Imaging: No results found.   Assessment & Plan:   COPD (chronic obstructive pulmonary disease) ([place]) - COPD with asthma overlap- frequent exacerbations  - Continue Advair hfa  twice daily - Add spiriva respimat 1.50mcg  - Needs repeat PFTs  Moderate persistent asthma - Eos absolute 1.1 in May  - Checking labs (cbc w/diff, bnp, bmet, IgE) - Consideration for biologic if patient is candidate  - RX prednisone taper (on hand for severe exacerbation)  Daytime somnolence - Epworth score 22 - Needs HST to evaluate for possible sleep apnea   FU in 4-6 weeks   Martyn Ehrich, NP 04/04/2019

## 2019-04-04 NOTE — Assessment & Plan Note (Signed)
-   Epworth score 22 - Needs HST to evaluate for possible sleep apnea

## 2019-04-04 NOTE — Patient Instructions (Addendum)
Orders: Needs labs today (ordered) Needs repeat CXR (already in system) Needs home sleep test re: daytime fatigue   Rx: Spiriva 1.25 - take 2 puffs once daily (sample given) Prednisone taper (to have on hand in case of emergency- let us know if you take)  Smoking cessation as discussed (taper down amount, pick date and do not touch after you quit)  Follow-up: 4-6 weeks with Dr. Annamaria Boots or Beth   Steps to Quit Smoking Smoking tobacco is the leading cause of preventable death. It can affect almost every organ in the body. Smoking puts you and people around you at risk for many serious, long-lasting (chronic) diseases. Quitting smoking can be hard, but it is one of the best things that you can do for your health. It is never too late to quit. How do I get ready to quit? When you decide to quit smoking, make a plan to help you succeed. Before you quit:  Pick a date to quit. Set a date within the next 2 weeks to give you time to prepare.  Write down the reasons why you are quitting. Keep this list in places where you will see it often.  Tell your family, friends, and co-workers that you are quitting. Their support is important.  Talk with your doctor about the choices that may help you quit.  Find out if your health insurance will pay for these treatments.  Know the people, places, things, and activities that make you want to smoke (triggers). Avoid them. What first steps can I take to quit smoking?  Throw away all cigarettes at home, at work, and in your car.  Throw away the things that you use when you smoke, such as ashtrays and lighters.  Clean your car. Make sure to empty the ashtray.  Clean your home, including curtains and carpets. What can I do to help me quit smoking? Talk with your doctor about taking medicines and seeing a counselor at the same time. You are more likely to succeed when you do both.  If you are pregnant or breastfeeding, talk with your doctor about  counseling or other ways to quit smoking. Do not take medicine to help you quit smoking unless your doctor tells you to do so. To quit smoking: Quit right away  Quit smoking totally, instead of slowly cutting back on how much you smoke over a period of time.  Go to counseling. You are more likely to quit if you go to counseling sessions regularly. Take medicine You may take medicines to help you quit. Some medicines need a prescription, and some you can buy over-the-counter. Some medicines may contain a drug called nicotine to replace the nicotine in cigarettes. Medicines may:  Help you to stop having the desire to smoke (cravings).  Help to stop the problems that come when you stop smoking (withdrawal symptoms). Your doctor may ask you to use:  Nicotine patches, gum, or lozenges.  Nicotine inhalers or sprays.  Non-nicotine medicine that is taken by mouth. Find resources Find resources and other ways to help you quit smoking and remain smoke-free after you quit. These resources are most helpful when you use them often. They include:  Online chats with a Social worker.  Phone quitlines.  Printed Furniture conservator/restorer.  Support groups or group counseling.  Text messaging programs.  Mobile phone apps. Use apps on your mobile phone or tablet that can help you stick to your quit plan. There are many free apps for mobile phones and tablets  as well as websites. Examples include Quit Guide from the [employer] and smokefree.gov  What things can I do to make it easier to quit?   Talk to your family and friends. Ask them to support and encourage you.  Call a phone quitline (1-800-QUIT-NOW), reach out to support groups, or work with a Social worker.  Ask people who smoke to not smoke around you.  Avoid places that make you want to smoke, such as: ? Bars. ? Parties. ? Smoke-break areas at work.  Spend time with people who do not smoke.  Lower the stress in your life. Stress can make you want to  smoke. Try these things to help your stress: ? Getting regular exercise. ? Doing deep-breathing exercises. ? Doing yoga. ? Meditating. ? Doing a body scan. To do this, close your eyes, focus on one area of your body at a time from head to toe. Notice which parts of your body are tense. Try to relax the muscles in those areas. How will I feel when I quit smoking? Day 1 to 3 weeks Within the first 24 hours, you may start to have some problems that come from quitting tobacco. These problems are very bad 2-3 days after you quit, but they do not often last for more than 2-3 weeks. You may get these symptoms:  Mood swings.  Feeling restless, nervous, angry, or annoyed.  Trouble concentrating.  Dizziness.  Strong desire for high-sugar foods and nicotine.  Weight gain.  Trouble pooping (constipation).  Feeling like you may vomit (nausea).  Coughing or a sore throat.  Changes in how the medicines that you take for other issues work in your body.  Depression.  Trouble sleeping (insomnia). Week 3 and afterward After the first 2-3 weeks of quitting, you may start to notice more positive results, such as:  Better sense of smell and taste.  Less coughing and sore throat.  Slower heart rate.  Lower blood pressure.  Clearer skin.  Better breathing.  Fewer sick days. Quitting smoking can be hard. Do not give up if you fail the first time. Some people need to try a few times before they succeed. Do your best to stick to your quit plan, and talk with your doctor if you have any questions or concerns. Summary  Smoking tobacco is the leading cause of preventable death. Quitting smoking can be hard, but it is one of the best things that you can do for your health.  When you decide to quit smoking, make a plan to help you succeed.  Quit smoking right away, not slowly over a period of time.  When you start quitting, seek help from your doctor, family, or friends. This information  is not intended to replace advice given to you by your health care provider. Make sure you discuss any questions you have with your health care provider. Document Released: 07/10/2009 Document Revised: 12/01/2018 Document Reviewed: 12/02/2018 Elsevier Patient Education  2020 [employer].

## 2019-04-05 LAB — IGE: IgE (Immunoglobulin E), Serum: 234 kU/L — ABNORMAL HIGH (ref <=114)

## 2019-04-06 NOTE — Progress Notes (Signed)
CXR normal. Labs ok, IgE was elevated which may mean she would qualify for biologic therapy if needed in the future. We can discuss more at next visit with myself or Dr. Annamaria Boots. Thanks

## 2019-04-17 ENCOUNTER — Other Ambulatory Visit: Payer: Self-pay

## 2019-04-17 ENCOUNTER — Ambulatory Visit: Payer: 59

## 2019-04-17 DIAGNOSIS — R400 Somnolence: Secondary | ICD-10-CM

## 2019-04-17 DIAGNOSIS — G4733 Obstructive sleep apnea (adult) (pediatric): Secondary | ICD-10-CM | POA: Diagnosis not present

## 2019-04-20 DIAGNOSIS — G4733 Obstructive sleep apnea (adult) (pediatric): Secondary | ICD-10-CM | POA: Diagnosis not present

## 2019-04-27 ENCOUNTER — Telehealth: Payer: Self-pay | Admitting: Primary Care

## 2019-04-27 MED ORDER — PREDNISONE 10 MG PO TABS: ORAL_TABLET | ORAL | 0 refills | Status: DC

## 2019-04-27 NOTE — Telephone Encounter (Signed)
Called and spoke with patient. Patient states she is having wheezing, and shortness of breath. She has a slight cough but nothing major and no real congestion.   Patient has taken hr spiriva this morning along with her albuterol inhaler (@1230 ) and her albuterol neb (@1300 ). She states she doesn't have any more duoneb.   Patient states she works at home she had no fevers or body aches.   Beth please advise

## 2019-04-27 NOTE — Telephone Encounter (Signed)
Make sure she is still using her Advair. Will send in prednisone taper, keep follow up with [place] in August.

## 2019-04-27 NOTE — Telephone Encounter (Signed)
Called pt and advised message from the provider. Pt understood and verbalized understanding. Nothing further is needed.    

## 2019-05-08 ENCOUNTER — Other Ambulatory Visit: Payer: Self-pay

## 2019-05-08 ENCOUNTER — Encounter: Payer: Self-pay | Admitting: Primary Care

## 2019-05-08 ENCOUNTER — Ambulatory Visit (INDEPENDENT_AMBULATORY_CARE_PROVIDER_SITE_OTHER): Payer: 59 | Admitting: Primary Care

## 2019-05-08 VITALS — BP 126/74 | HR 92 | Temp 98.2°F | Ht 61.5 in | Wt 364.6 lb

## 2019-05-08 DIAGNOSIS — J4540 Moderate persistent asthma, uncomplicated: Secondary | ICD-10-CM

## 2019-05-08 DIAGNOSIS — Z720 Tobacco use: Secondary | ICD-10-CM | POA: Diagnosis not present

## 2019-05-08 MED ORDER — CETIRIZINE HCL 10 MG PO TABS: 10.0000 mg | ORAL_TABLET | Freq: Every day | ORAL | 6 refills | Status: DC

## 2019-05-08 NOTE — Progress Notes (Signed)
@Patient  ID: April Ellis, female    DOB: 11/11/1975, 43 y.o.   MRN: [id number]  Chief Complaint  Patient presents with   Follow-up    F/U after being on prednisone. States her breathing has been getting better since 7/31. Still working on stopping smoking, plans to quit this Friday.     Referring provider: Ngetich, Nelda Bucks, NP  HPI: 43 year female, current smoker. PMH moderate persistent asthma, COPD, chronic rhinitis, obesity, tobacco abuse. Patient of Dr. Annamaria Boots, last seen on on 01/12/19. Recent hospital admission 5/21-5/22 for acute respiratory failure. Called office on 03/21/19 with complaints of trouble breathing, sent in prescription for prednisone taper. Eos absolute 1.1 in May.   Previous [facility] pulmonary encounters: 04/04/2019 Patient presents today for 1-2 week follow-up from most recent asthma exacerbation. Feeling ok today, states that she has good and bad days. She had an asthma flare again on Saturday 7/4 while inside eating breakfast. She took a generic allergy pill which did not help. Reports ear itching followed by sob/wheezing. Denies food allergies. EMS came and she was given duoneb treatment. She is maintained on Advair hfa 23/21, Singulair and prn duonebs. She is still smoking 1/2 ppd. She quit for two years in the past once. Has tried chantix and patches. Needs sleep study for OSA eval, high risk d.t weight. Reports daytime fatigue. Wakes up 1-2 times at night. No known snoring or apnea. Epworth score 22.   CXR normal. Labs ok, IgE was elevated. May consider biologic therapy if needed in the future.  05/08/2019 Patient presents today for follow-up. Treated for asthma exacerbation with prednisone taper on 7/31. She is doing better. She was not taking both Advair and Spiriva. Since starting both her breathing has improved. She has some mild sinus congestion. Uses Flonase as needed and continues Singulair. Hx elevated IgE, most recent eosinophils were zero. Needs to be off  prednisone for 1-2 weeks before rechecking. She has been cutting back on her daily cigarette use. She set a quit date for this Friday which is her birthday.    PFTs 2015 - FVC 2.47 (84%), FEV1 1.46 (59%), ratio 59 Severe obstruction   Allergies  Allergen Reactions   Penicillins     Childhood Allergy Did it involve swelling of the face/tongue/throat, SOB, or low BP? UNK Did it involve sudden or severe rash/hives, skin peeling, or any reaction on the inside of your mouth or nose? UNK Did you need to seek medical attention at a hospital or doctor's office? UNK When did it last happen?more than 10 years If all above answers are NO, may proceed with cephalosporin use.     Immunization History  Administered Date(s) Administered   Td 12/19/2008    Past Medical History:  Diagnosis Date   Anemia    Asthma    Bronchitis    COPD (chronic obstructive pulmonary disease) ([place])    Obesity     Tobacco History: Social History   Tobacco Use  Smoking Status Current Every Day Smoker   Packs/day: 0.25   Years: 15.00   Pack years: 3.75   Types: Cigarettes   Last attempt to quit: 09/16/2014   Years since quitting: 4.6  Smokeless Tobacco Never Used  Tobacco Comment   smoking 2-3 ciggs per day/taking Chantix 01/12/19   Ready to quit: Not Answered Counseling given: Not Answered Comment: smoking 2-3 ciggs per day/taking Chantix 01/12/19   Outpatient Medications Prior to Visit  Medication Sig Dispense Refill   [place] 230-21  MCG/ACT inhaler TAKE 2 PUFFS BY MOUTH TWICE A DAY 36 Inhaler 2   albuterol (PROVENTIL) (2.5 MG/3ML) 0.083% nebulizer solution Take 6 mLs (5 mg total) by nebulization every 4 (four) hours as needed for wheezing or shortness of breath. 75 mL 0   albuterol (VENTOLIN HFA) 108 (90 Base) MCG/ACT inhaler Inhale 1-2 puffs into the lungs every 6 (six) hours as needed for wheezing or shortness of breath. 1 Inhaler 12   ferrous sulfate 325 (65 FE) MG  EC tablet Take 325 mg by mouth daily.     ipratropium-albuterol (DUONEB) 0.5-2.5 (3) MG/3ML SOLN Take 3 mLs by nebulization every 6 (six) hours as needed. 360 mL 0   montelukast (SINGULAIR) 10 MG tablet Take 1 tablet (10 mg total) by mouth at bedtime. 90 tablet 0   nicotine (NICODERM CQ - DOSED IN MG/24 HOURS) 21 mg/24hr patch Place 1 patch (21 mg total) onto the skin daily as needed (smoking craving). 28 patch 0   Tiotropium Bromide Monohydrate (SPIRIVA RESPIMAT) 1.25 MCG/ACT AERS Inhale 2 puffs into the lungs daily. 8 g 0   varenicline (CHANTIX) 0.5 MG tablet TAKE 0.5 MG TABLET DAILY X 3 DAYS THEN TAKE 0.5 MG TABLET TWICE DAILY X 7 DAYS THEN TAKE 1 MG TABLET TWICE DAILY X 12 WEEKS 60 tablet 2   predniSONE (DELTASONE) 10 MG tablet Take 4 tabs po daily x 2 days; then 3 tabs for 2 days; then 2 tabs for 2 days; then 1 tab for 2 days 20 tablet 0   No facility-administered medications prior to visit.     Review of Systems  Review of Systems  Constitutional: Negative.   HENT: Negative.   Respiratory: Negative for cough, shortness of breath and wheezing.   Cardiovascular: Negative.     Physical Exam  BP 126/74    Pulse 92    Temp 98.2 F (36.8 C) (Oral)    Ht 5' 1.5" (1.562 m)    Wt (!) 364 lb 9.6 oz (165.4 kg)    SpO2 95%    BMI 67.77 kg/m  Physical Exam Constitutional:      Appearance: Normal appearance. She is obese.  HENT:     Head: Normocephalic and atraumatic.  Cardiovascular:     Rate and Rhythm: Normal rate and regular rhythm.  Pulmonary:     Effort: Pulmonary effort is normal.     Breath sounds: Normal breath sounds. No wheezing or rhonchi.  Skin:    General: Skin is warm and dry.  Neurological:     General: No focal deficit present.     Mental Status: She is alert and oriented to person, place, and time. Mental status is at baseline.  Psychiatric:        Mood and Affect: Mood normal.        Behavior: Behavior normal.        Thought Content: Thought content normal.         Judgment: Judgment normal.      Lab Results:  CBC    Component Value Date/Time   WBC 10.2 04/04/2019 0955   RBC 5.48 (H) 04/04/2019 0955   HGB 11.0 (L) 04/04/2019 0955   HCT 37.1 04/04/2019 0955   PLT 358.0 04/04/2019 0955   MCV 67.7 Repeated and verified X2. (L) 04/04/2019 0955   MCV 75.5 (A) 11/18/2014 2014   MCH 18.9 (L) 02/16/2019 0703   MCHC 29.8 Repeated and verified X2. (L) 04/04/2019 0955   RDW 20.9 (H) 04/04/2019 3704  LYMPHSABS 1.0 04/04/2019 0955   MONOABS 0.2 04/04/2019 0955   EOSABS 0.0 04/04/2019 0955   BASOSABS 0.1 04/04/2019 0955    BMET    Component Value Date/Time   NA 137 04/04/2019 0955   K 4.2 04/04/2019 0955   CL 103 04/04/2019 0955   CO2 27 04/04/2019 0955   GLUCOSE 122 (H) 04/04/2019 0955   BUN 8 04/04/2019 0955   CREATININE 0.74 04/04/2019 0955   CREATININE 0.71 03/23/2017 1110   CALCIUM 8.9 04/04/2019 0955   GFRNONAA >60 02/16/2019 0703   GFRNONAA >89 03/23/2017 1110   GFRAA >60 02/16/2019 0703   GFRAA >89 03/23/2017 1110    BNP No results found for: BNP  ProBNP    Component Value Date/Time   PROBNP 41.0 04/04/2019 0955    Imaging: No results found.   Assessment & Plan:   Moderate persistent asthma - COPD/asthma overlap, recurrent exacerbations requiring oral prednisone - Continue Advair twice daily and Spiriva daily  - Recommend Flonase, singulair and zyrtec  - Needs repeat CBC with diff  - Smoking cessation reinforced  - May need biologic in the future. Xolair will likely not work d/t weight 364lbs. May qualify for Cingair or dupixent   Tobacco abuse - QUIT date set for this Friday 8/14 - She has nicotine patches/gum to use as nicotine replacement therapy    Martyn Ehrich, NP 05/08/2019

## 2019-05-08 NOTE — Assessment & Plan Note (Signed)
-   QUIT date set for this Friday 8/14 - She has nicotine patches/gum to use as nicotine replacement therapy

## 2019-05-08 NOTE — Assessment & Plan Note (Signed)
-   COPD/asthma overlap, recurrent exacerbations requiring oral prednisone - Continue Advair twice daily and Spiriva daily  - Recommend Flonase, singulair and zyrtec  - Needs repeat CBC with diff  - Smoking cessation reinforced  - May need biologic in the future. Xolair will likely not work d/t weight 364lbs. May qualify for [place] or dupixent

## 2019-05-08 NOTE — Patient Instructions (Addendum)
Needs to repeat CBC with diff when off prednisone for 1-2 weeks  Use flonase daily Add zyrtec daily  Continue Singulair daily   Continue Advair- take two puffs twice daily Continue Spiriva - take 2 puffs once daily   STRONGLY encourage smoking cessation, this is flaring your breathing symptoms   Follow-up on 3 months with Dr. Patsi Sears NP or sooner if needed

## 2019-05-19 ENCOUNTER — Encounter (HOSPITAL_COMMUNITY): Payer: Self-pay | Admitting: *Deleted

## 2019-05-19 ENCOUNTER — Other Ambulatory Visit: Payer: Self-pay

## 2019-05-19 ENCOUNTER — Emergency Department (HOSPITAL_COMMUNITY): Payer: 59

## 2019-05-19 ENCOUNTER — Emergency Department (HOSPITAL_COMMUNITY)
Admission: EM | Admit: 2019-05-19 | Discharge: 2019-05-19 | Disposition: A | Discharge status: Home / Self Care | Payer: 59 | Attending: Emergency Medicine | Admitting: Emergency Medicine

## 2019-05-19 DIAGNOSIS — Z20828 Contact with and (suspected) exposure to other viral communicable diseases: Secondary | ICD-10-CM | POA: Insufficient documentation

## 2019-05-19 DIAGNOSIS — F17210 Nicotine dependence, cigarettes, uncomplicated: Secondary | ICD-10-CM | POA: Insufficient documentation

## 2019-05-19 DIAGNOSIS — Z79899 Other long term (current) drug therapy: Secondary | ICD-10-CM | POA: Insufficient documentation

## 2019-05-19 DIAGNOSIS — J449 Chronic obstructive pulmonary disease, unspecified: Secondary | ICD-10-CM | POA: Diagnosis not present

## 2019-05-19 DIAGNOSIS — J4521 Mild intermittent asthma with (acute) exacerbation: Secondary | ICD-10-CM | POA: Diagnosis not present

## 2019-05-19 DIAGNOSIS — R05 Cough: Secondary | ICD-10-CM | POA: Diagnosis present

## 2019-05-19 MED ORDER — IPRATROPIUM BROMIDE 0.02 % IN SOLN
1.0000 mg | Freq: Once | RESPIRATORY_TRACT | Status: AC
  Administered 2019-05-19: 21:00:00 1 mg via RESPIRATORY_TRACT
  Filled 2019-05-19: qty 5

## 2019-05-19 MED ORDER — ALBUTEROL (5 MG/ML) CONTINUOUS INHALATION SOLN
10.0000 mg/h | INHALATION_SOLUTION | Freq: Once | RESPIRATORY_TRACT | Status: AC
  Administered 2019-05-19: 10 mg/h via RESPIRATORY_TRACT
  Filled 2019-05-19: qty 20

## 2019-05-19 MED ORDER — PREDNISONE 20 MG PO TABS
60.0000 mg | ORAL_TABLET | Freq: Once | ORAL | Status: AC
  Administered 2019-05-19: 23:00:00 60 mg via ORAL
  Filled 2019-05-19: qty 3

## 2019-05-19 MED ORDER — ALBUTEROL SULFATE HFA 108 (90 BASE) MCG/ACT IN AERS
8.0000 | INHALATION_SPRAY | Freq: Once | RESPIRATORY_TRACT | Status: AC
  Administered 2019-05-19: 8 via RESPIRATORY_TRACT
  Filled 2019-05-19: qty 6.7

## 2019-05-19 MED ORDER — PREDNISONE 20 MG PO TABS: 40.0000 mg | ORAL_TABLET | Freq: Every day | ORAL | 0 refills | Status: DC

## 2019-05-19 MED ORDER — ALBUTEROL SULFATE HFA 108 (90 BASE) MCG/ACT IN AERS: 2.0000 | INHALATION_SPRAY | RESPIRATORY_TRACT | 0 refills | Status: DC | PRN

## 2019-05-19 MED ORDER — ALBUTEROL SULFATE (2.5 MG/3ML) 0.083% IN NEBU: 2.5000 mg | INHALATION_SOLUTION | RESPIRATORY_TRACT | 0 refills | Status: DC | PRN

## 2019-05-19 NOTE — ED Notes (Signed)
Pt able to maintain O2 saturation above 96% while ambulating.  MD made aware.

## 2019-05-19 NOTE — Discharge Instructions (Signed)
Take the prescriptions as directed.  Use your albuterol inhaler (2 to 4 puffs) or your albuterol nebulizer (1 unit dose) every 4 hours for the next 7 days, then as needed for cough, wheezing, or shortness of breath. Keep yourself under quarantine until your COVID testing results (likely over the next 2 days). Call your regular medical doctor Monday morning to schedule a follow up appointment within the next 3 days.  Return to the Emergency Department immediately sooner if worsening.

## 2019-05-19 NOTE — ED Triage Notes (Signed)
Pt has asthma, flare up today, diminished and wheezing in triage. Cough present with wheezing

## 2019-05-19 NOTE — Progress Notes (Signed)
RT went to MD and RN about patients covid test had not came back yet . This RT was informed that the hospital have ran out of rapid covid test. Patient used albuterol inhaler with no results. Patient had very little air movement and slight wheezing. Patient is low risk for covid and has asthma flair ups.RT place surgical mask over mask to give treatment.RT will continue to monitor.

## 2019-05-19 NOTE — ED Provider Notes (Signed)
[facility] DEPT Provider Note   CSN: [id number] Arrival date & time: 05/19/19  1727     History   Chief Complaint Chief Complaint  Patient presents with  . Asthma    HPI GLENDINE KEMERER is a 43 y.o. female.     HPI  Pt was seen at 1825.  Per pt, c/o gradual onset and worsening of persistent cough, wheezing and SOB for the past 2 days.  Describes her symptoms as "my asthma is acting up."  Has been using home MDI and nebs with transient relief.  Denies COVID exposure. Denies CP/palpitations, no back pain, no abd pain, no N/V/D, no fevers, no rash.    Past Medical History:  Diagnosis Date  . Anemia   . Asthma   . Bronchitis   . COPD (chronic obstructive pulmonary disease) ([place])   . Obesity     Patient Active Problem List   Diagnosis Date Noted  . Daytime somnolence 04/04/2019  . Dyspnea 02/15/2019  . Acute respiratory failure ([place]) 11/20/2018  . Allergic reactions 08/16/2016  . Chronic rhinitis 08/16/2016  . Asthma, chronic, unspecified asthma severity, with acute exacerbation 09/30/2014  . Leukocytosis 09/30/2014  . Urticaria   . Iron deficiency anemia 08/14/2014  . History of tobacco abuse 08/14/2014  . Moderate persistent asthma 08/13/2014  . Atypical chest pain 06/27/2014  . Tobacco abuse 05/12/2014  . COPD (chronic obstructive pulmonary disease) ([place]) 05/01/2014  . Morbid obesity ([place]) 12/19/2008  . ELEVATED BLOOD PRESSURE WITHOUT DIAGNOSIS OF HYPERTENSION 12/19/2008    Past Surgical History:  Procedure Laterality Date  . DENTAL SURGERY     2 teeth pulled, 11/22/14  . NO PAST SURGERIES       OB History   No obstetric history on file.      Home Medications    Prior to Admission medications   Medication Sig Start Date End Date Taking? Authorizing Provider  ADVAIR HFA 230-21 MCG/ACT inhaler TAKE 2 PUFFS BY MOUTH TWICE A DAY 02/20/19  Yes Ngetich, Dinah C, NP  albuterol (PROVENTIL) (2.5 MG/3ML) 0.083% nebulizer solution  Take 6 mLs (5 mg total) by nebulization every 4 (four) hours as needed for wheezing or shortness of breath. 02/16/19  Yes Sheikh, Omair Latif, DO  albuterol (VENTOLIN HFA) 108 (90 Base) MCG/ACT inhaler Inhale 1-2 puffs into the lungs every 6 (six) hours as needed for wheezing or shortness of breath. 03/06/19  Yes Ngetich, Dinah C, NP  cetirizine (ZYRTEC) 10 MG tablet Take 1 tablet (10 mg total) by mouth daily. 05/08/19  Yes Martyn Ehrich, NP  ferrous sulfate 325 (65 FE) MG EC tablet Take 325 mg by mouth daily.   Yes [provider]  guaiFENesin (MUCINEX) 600 MG 12 hr tablet Take 600 mg by mouth 2 (two) times daily.   Yes [provider]  montelukast (SINGULAIR) 10 MG tablet Take 1 tablet (10 mg total) by mouth at bedtime. 03/06/19  Yes Ngetich, Dinah C, NP  Tiotropium Bromide Monohydrate (SPIRIVA RESPIMAT) 1.25 MCG/ACT AERS Inhale 2 puffs into the lungs daily. 04/04/19  Yes Martyn Ehrich, NP  ipratropium-albuterol (DUONEB) 0.5-2.5 (3) MG/3ML SOLN Take 3 mLs by nebulization every 6 (six) hours as needed. Patient not taking: Reported on 05/19/2019 02/16/19   Kerney Elbe, DO    Family History Family History  Problem Relation Age of Onset  . Diabetes Mother   . Cancer Mother   . Diabetes Sister   . Hypertension Sister   . Hypertension  Sister   . Urticaria Neg Hx   . Immunodeficiency Neg Hx   . Eczema Neg Hx   . Asthma Neg Hx   . Angioedema Neg Hx   . Allergic rhinitis Neg Hx     Social History Social History   Tobacco Use  . Smoking status: Current Every Day Smoker    Packs/day: 0.25    Years: 15.00    Pack years: 3.75    Types: Cigarettes    Last attempt to quit: 09/16/2014    Years since quitting: 4.6  . Smokeless tobacco: Never Used  . Tobacco comment: smoking 2-3 ciggs per day/taking Chantix 01/12/19  Substance Use Topics  . Alcohol use: No    Alcohol/week: 0.0 standard drinks  . Drug use: No     Allergies   Penicillins   Review of Systems  Review of Systems ROS: Statement: All systems negative except as marked or noted in the HPI; Constitutional: Negative for fever and chills. ; ; Eyes: Negative for eye pain, redness and discharge. ; ; ENMT: Negative for ear pain, hoarseness, nasal congestion, sinus pressure and sore throat. ; ; Cardiovascular: Negative for chest pain, palpitations, diaphoresis, and peripheral edema. ; ; Respiratory: +cough, wheezing, SOB. Negative for stridor. ; ; Gastrointestinal: Negative for nausea, vomiting, diarrhea, abdominal pain, blood in stool, hematemesis, jaundice and rectal bleeding. . ; ; Genitourinary: Negative for dysuria, flank pain and hematuria. ; ; Musculoskeletal: Negative for back pain and neck pain. Negative for swelling and trauma.; ; Skin: Negative for pruritus, rash, abrasions, blisters, bruising and skin lesion.; ; Neuro: Negative for headache, lightheadedness and neck stiffness. Negative for weakness, altered level of consciousness, altered mental status, extremity weakness, paresthesias, involuntary movement, seizure and syncope.       Physical Exam Updated Vital Signs BP (!) 148/96 (BP Location: Right Arm)   Pulse (!) 106   Temp 98.6 F (37 C) (Oral)   Resp (!) 22   Ht 5' (1.524 m)   Wt (!) 163.3 kg   SpO2 96%   BMI 70.31 kg/m   Physical Exam 1830: Physical examination:  Nursing notes reviewed; Vital signs and O2 SAT reviewed;  Constitutional: Well developed, Well nourished, Well hydrated, Uncomfortable appearing.;; Head:  Normocephalic, atraumatic; Eyes: EOMI, PERRL, No scleral icterus; ENMT: Mouth and pharynx normal, Mucous membranes moist; Neck: Supple, Full range of motion, No lymphadenopathy; Cardiovascular: Tachycardic rate and rhythm, No gallop; Respiratory: Breath sounds diminished & equal bilaterally, insp/exp wheezes bilat. No audible wheezing.  Speaking sentences, sitting upright, tachypneic.; Chest: Nontender, Movement normal; Abdomen: Soft, Nontender, Nondistended,  Normal bowel sounds; Genitourinary: No CVA tenderness; Extremities: Pulses normal, No tenderness, No edema, No calf edema or asymmetry.; Neuro: AA&Ox3, Major CN grossly intact.  Speech clear. No gross focal motor or sensory deficits in extremities.; Skin: Color normal, Warm, Dry.   ED Treatments / Results  Labs (all labs ordered are listed, but only abnormal results are displayed)   EKG None  Radiology   Procedures Procedures (including critical care time)  Medications Ordered in ED Medications  predniSONE (DELTASONE) tablet 60 mg (has no administration in time range)  albuterol (PROVENTIL,VENTOLIN) solution continuous neb (has no administration in time range)  ipratropium (ATROVENT) nebulizer solution 1 mg (has no administration in time range)  albuterol (VENTOLIN HFA) 108 (90 Base) MCG/ACT inhaler 8 puff (8 puffs Inhalation Given 05/19/19 1929)     Initial Impression / Assessment and Plan / ED Course  I have reviewed the triage vital signs and the  nursing notes.  Pertinent labs & imaging results that were available during my care of the patient were reviewed by me and considered in my medical decision making (see chart for details).    MDM Reviewed: previous chart, nursing note and vitals Interpretation: x-ray   Dg Chest Port 1 View Result Date: 05/19/2019 CLINICAL DATA:  Cough and wheezing. EXAM: PORTABLE CHEST 1 VIEW COMPARISON:  Radiograph 04/04/2019, multiple priors. FINDINGS: Mild cardiomegaly with unchanged mediastinal contours. Pulmonary vasculature is normal. No consolidation, pleural effusion, or pneumothorax. No acute osseous abnormalities are seen. IMPRESSION: Mild cardiomegaly without acute abnormality. Electronically Signed   By: Keith Rake M.D.   On: 05/19/2019 19:05    NORALEE CALAFIORE was evaluated in Emergency Department on 05/19/2019 for the symptoms described in the history of present illness. She was evaluated in the context of the global COVID-19  pandemic, which necessitated consideration that the patient might be at risk for infection with the SARS-CoV-2 virus that causes COVID-19. Institutional protocols and algorithms that pertain to the evaluation of patients at risk for COVID-19 are in a state of rapid change based on information released by regulatory bodies including the CDC and federal and state organizations. These policies and algorithms were followed during the patient's care in the ED.   2300:  Pt states she "feels better" after neb and steroid.  NAD, lungs coarse bilat, no wheezing, resps easy, speaking full sentences, Sats 100% R/A.  Pt ambulated around the ED with Sats remaining 96< % R/A, resps easy, NAD.  Pt states she wants to go home now. Dx and testing, d/w pt.  Questions answered.  Verb understanding, agreeable to d/c home with outpt f/u.     Final Clinical Impressions(s) / ED Diagnoses   Final diagnoses:  None    ED Discharge Orders    None       Francine Graven, DO 05/24/19 [id number]

## 2019-05-21 LAB — NOVEL CORONAVIRUS, NAA (HOSP ORDER, SEND-OUT TO REF LAB; TAT 18-24 HRS): SARS-CoV-2, NAA: NOT DETECTED

## 2019-05-22 ENCOUNTER — Telehealth: Payer: Self-pay | Admitting: *Deleted

## 2019-05-22 NOTE — Telephone Encounter (Signed)
Ngetich, Nelda Bucks, NP  May, Anita A, [place]; Ruthell Rummage A, CMA        Hi Rodena Piety,  Ms.Ewen was in the ED 05/19/2019 but I don't see a note for review.I just received a message for patient to follow up with [place] in 3 days.she has an appointment coming up 06/06/2019.  Check with patient how she is doing whether to keep above appointment or need to schedule it sooner.  Thanks  Yahoo with patient and she stated that she is doing good and doesn't feel like she needs to follow up any sooner. I confirmed appointment on 9/9. Patient will call back if she needs to be seen sooner.

## 2019-05-30 ENCOUNTER — Other Ambulatory Visit: Payer: Self-pay | Admitting: Family

## 2019-05-30 DIAGNOSIS — J4540 Moderate persistent asthma, uncomplicated: Secondary | ICD-10-CM

## 2019-06-06 ENCOUNTER — Encounter: Payer: Self-pay | Admitting: Family

## 2019-06-06 ENCOUNTER — Ambulatory Visit (INDEPENDENT_AMBULATORY_CARE_PROVIDER_SITE_OTHER): Payer: 59 | Admitting: Family

## 2019-06-06 ENCOUNTER — Other Ambulatory Visit: Payer: Self-pay

## 2019-06-06 VITALS — BP 126/80 | HR 86 | Temp 98.2°F | Ht 62.0 in | Wt 368.0 lb

## 2019-06-06 DIAGNOSIS — R600 Localized edema: Secondary | ICD-10-CM | POA: Diagnosis not present

## 2019-06-06 DIAGNOSIS — J449 Chronic obstructive pulmonary disease, unspecified: Secondary | ICD-10-CM | POA: Diagnosis not present

## 2019-06-06 DIAGNOSIS — D509 Iron deficiency anemia, unspecified: Secondary | ICD-10-CM

## 2019-06-06 DIAGNOSIS — J4541 Moderate persistent asthma with (acute) exacerbation: Secondary | ICD-10-CM

## 2019-06-06 DIAGNOSIS — Z6841 Body Mass Index (BMI) 40.0 and over, adult: Secondary | ICD-10-CM

## 2019-06-06 MED ORDER — IPRATROPIUM-ALBUTEROL 0.5-2.5 (3) MG/3ML IN SOLN: 3.0000 mL | Freq: Four times a day (QID) | RESPIRATORY_TRACT | 0 refills | Status: DC | PRN

## 2019-06-06 MED ORDER — ALBUTEROL SULFATE (2.5 MG/3ML) 0.083% IN NEBU: 2.5000 mg | INHALATION_SOLUTION | RESPIRATORY_TRACT | 0 refills | Status: DC | PRN

## 2019-06-06 NOTE — Patient Instructions (Signed)
Fat and Cholesterol Restricted Eating Plan Getting too much fat and cholesterol in your diet may cause health problems. Choosing the right foods helps keep your fat and cholesterol at normal levels. This can keep you from getting certain diseases. Your doctor may recommend an eating plan that includes:  Total fat: ______% or less of total calories a day.  Saturated fat: ______% or less of total calories a day.  Cholesterol: less than _________mg a day.  Fiber: ______g a day. What are tips for following this plan? Meal planning  At meals, divide your plate into four equal parts: ? Fill one-half of your plate with vegetables and green salads. ? Fill one-fourth of your plate with whole grains. ? Fill one-fourth of your plate with low-fat (lean) protein foods.  Eat fish that is high in omega-3 fats at least two times a week. This includes mackerel, tuna, sardines, and salmon.  Eat foods that are high in fiber, such as whole grains, beans, apples, broccoli, carrots, peas, and barley. General tips   Work with your doctor to lose weight if you need to.  Avoid: ? Foods with added sugar. ? Fried foods. ? Foods with partially hydrogenated oils.  Limit alcohol intake to no more than 1 drink a day for nonpregnant women and 2 drinks a day for men. One drink equals 12 oz of beer, 5 oz of wine, or 1 oz of hard liquor. Reading food labels  Check food labels for: ? Trans fats. ? Partially hydrogenated oils. ? Saturated fat (g) in each serving. ? Cholesterol (mg) in each serving. ? Fiber (g) in each serving.  Choose foods with healthy fats, such as: ? Monounsaturated fats. ? Polyunsaturated fats. ? Omega-3 fats.  Choose grain products that have whole grains. Look for the word "whole" as the first word in the ingredient list. Cooking  Cook foods using low-fat methods. These include baking, boiling, grilling, and broiling.  Eat more home-cooked foods. Eat at restaurants and buffets  less often.  Avoid cooking using saturated fats, such as butter, cream, palm oil, palm kernel oil, and coconut oil. Recommended foods  Fruits  All fresh, canned (in natural juice), or frozen fruits. Vegetables  Fresh or frozen vegetables (raw, steamed, roasted, or grilled). Green salads. Grains  Whole grains, such as whole wheat or whole grain breads, crackers, cereals, and pasta. Unsweetened oatmeal, bulgur, barley, quinoa, or brown rice. Corn or whole wheat flour tortillas. Meats and other protein foods  Ground beef (85% or leaner), grass-fed beef, or beef trimmed of fat. Skinless chicken or turkey. Ground chicken or turkey. Pork trimmed of fat. All fish and seafood. Egg whites. Dried beans, peas, or lentils. Unsalted nuts or seeds. Unsalted canned beans. Nut butters without added sugar or oil. Dairy  Low-fat or nonfat dairy products, such as skim or 1% milk, 2% or reduced-fat cheeses, low-fat and fat-free ricotta or cottage cheese, or plain low-fat and nonfat yogurt. Fats and oils  Tub margarine without trans fats. Light or reduced-fat mayonnaise and salad dressings. Avocado. Olive, canola, sesame, or safflower oils. The items listed above may not be a complete list of foods and beverages you can eat. Contact a dietitian for more information. Foods to avoid Fruits  Canned fruit in heavy syrup. Fruit in cream or butter sauce. Fried fruit. Vegetables  Vegetables cooked in cheese, cream, or butter sauce. Fried vegetables. Grains  White bread. White pasta. White rice. Cornbread. Bagels, pastries, and croissants. Crackers and snack foods that contain trans fat   and hydrogenated oils. Meats and other protein foods  Fatty cuts of meat. Ribs, chicken wings, bacon, sausage, bologna, salami, chitterlings, fatback, hot dogs, bratwurst, and packaged lunch meats. Liver and organ meats. Whole eggs and egg yolks. Chicken and turkey with skin. Fried meat. Dairy  Whole or 2% milk, cream,  half-and-half, and cream cheese. Whole milk cheeses. Whole-fat or sweetened yogurt. Full-fat cheeses. Nondairy creamers and whipped toppings. Processed cheese, cheese spreads, and cheese curds. Beverages  Alcohol. Sugar-sweetened drinks such as sodas, lemonade, and fruit drinks. Fats and oils  Butter, stick margarine, lard, shortening, ghee, or bacon fat. Coconut, palm kernel, and palm oils. Sweets and desserts  Corn syrup, sugars, honey, and molasses. Candy. Jam and jelly. Syrup. Sweetened cereals. Cookies, pies, cakes, donuts, muffins, and ice cream. The items listed above may not be a complete list of foods and beverages you should avoid. Contact a dietitian for more information. Summary  Choosing the right foods helps keep your fat and cholesterol at normal levels. This can keep you from getting certain diseases.  At meals, fill one-half of your plate with vegetables and green salads.  Eat high-fiber foods, like whole grains, beans, apples, carrots, peas, and barley.  Limit added sugar, saturated fats, alcohol, and fried foods. This information is not intended to replace advice given to you by your health care provider. Make sure you discuss any questions you have with your health care provider. Document Released: 03/14/2012 Document Revised: 05/17/2018 Document Reviewed: 05/31/2017 Elsevier Patient Education  2020 Elsevier Inc.  

## 2019-06-06 NOTE — Progress Notes (Signed)
Provider: Marlowe Sax FNP-C   Chevie Birkhead, Nelda Bucks, NP  Patient Care Team: Martin Belling, Nelda Bucks, NP as PCP - General (Family Medicine)  Extended Emergency Contact Information Primary Emergency Contact: Shelp,Elaine Address: [address]          [place], [place] [address] Johnnette Litter of [place] Phone: [phone] Mobile Phone: [phone] Relation: Sister  Code Status:  Full Code  Goals of care: Advanced Directive information Advanced Directives 05/19/2019  Does Patient Have a Medical Advance Directive? No  Would patient like information on creating a medical advance directive? -     Chief Complaint  Patient presents with  . Medical Management of Chronic Issues    3 month follow up still struggling with shortness of breath, patient c.o hurting in back     HPI:  Pt is a 43 y.o. female seen today for medical management of chronic diseases.she states was recently seen in the ED 05/19/2019 for COPD exacerbation.she was treated with Nebs and steroids with much improvement.she was discharged home.she states breathing has been stable since discharge from the hospital.currently on Albuterol,advair,spiriva,Duoneb,singulair and Mucinex.she follows up with Pulmonologist. Has upcoming appointment 05/13/2019.she states continues to smoke 7- 10 cigarettes per day.Chantix prescribed in previous visit but states not taking it including Nicotine gum/patch prescribed in the ED.she states " I Just need to make up my mind to do it".she states too much going on right now with COVID-19 restritictions and schools being closed knows her son won't be able to do his work as he is supposed to.  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   Past Medical History:  Diagnosis Date  . Anemia   . Asthma   . Bronchitis   . COPD (chronic obstructive pulmonary disease) ([place])   . Obesity    Past Surgical History:  Procedure Laterality Date  . DENTAL SURGERY     2 teeth pulled, 11/22/14  . NO PAST SURGERIES      Allergies  Allergen Reactions  . Penicillins     Childhood Allergy Did it involve swelling of the face/tongue/throat, SOB, or low BP? UNK Did it involve sudden or severe rash/hives, skin peeling, or any reaction on the inside of your mouth or nose? UNK Did you need to seek medical attention at a hospital or doctor's office? UNK When did it last happen?more than 10 years If all above answers are "NO", may proceed with cephalosporin use.     Allergies as of 06/06/2019      Reactions   Penicillins    Childhood Allergy Did it involve swelling of the face/tongue/throat, SOB, or low BP? UNK Did it involve sudden or severe rash/hives, skin peeling, or any reaction on the inside of your mouth or nose? UNK Did you need to seek medical attention at a hospital or doctor's office? UNK When did it last happen?more than 10 years If all above answers are "NO", may proceed with cephalosporin use.      Medication List       Accurate as of June 06, 2019  3:15 PM. If you  have any questions, ask your nurse or doctor.        STOP taking these medications   predniSONE 20 MG tablet Commonly known as: DELTASONE Stopped by: Sandrea Hughs, NP     TAKE these medications   Advair HFA 230-21 MCG/ACT inhaler Generic drug: fluticasone-salmeterol TAKE 2 PUFFS BY MOUTH TWICE A DAY   albuterol 108 (90 Base) MCG/ACT inhaler Commonly known as: VENTOLIN HFA Inhale 2 puffs into the lungs every 4 (four) hours as needed for wheezing or shortness of breath. What changed: Another medication with the same name was  removed. Continue taking this medication, and follow the directions you see here. Changed by: Sandrea Hughs, NP   albuterol (2.5 MG/3ML) 0.083% nebulizer solution Commonly known as: PROVENTIL Take 3 mLs (2.5 mg total) by nebulization every 4 (four) hours as needed for wheezing or shortness of breath. What changed: Another medication with the same name was removed. Continue taking this medication, and follow the directions you see here. Changed by: Sandrea Hughs, NP   cetirizine 10 MG tablet Commonly known as: ZYRTEC Take 1 tablet (10 mg total) by mouth daily.   ferrous sulfate 325 (65 FE) MG EC tablet Take 325 mg by mouth daily.   ipratropium-albuterol 0.5-2.5 (3) MG/3ML Soln Commonly known as: DUONEB Take 3 mLs by nebulization every 6 (six) hours as needed.   montelukast 10 MG tablet Commonly known as: SINGULAIR TAKE 1 TABLET BY MOUTH EVERYDAY AT BEDTIME   Mucinex 600 MG 12 hr tablet Generic drug: guaiFENesin Take 600 mg by mouth 2 (two) times daily.   Spiriva Respimat 1.25 MCG/ACT Aers Generic drug: Tiotropium Bromide Monohydrate Inhale 2 puffs into the lungs daily.       Review of Systems  Constitutional: Negative for appetite change, chills, fatigue and fever.  HENT: Negative for congestion, rhinorrhea, sinus pressure, sinus pain, sneezing, sore throat and trouble swallowing.   Eyes: Negative for pain, discharge, redness, itching and visual disturbance.  Respiratory: Negative for cough, chest tightness and wheezing.        Chronic shortness of breath with exertion.   Cardiovascular: Positive for leg swelling. Negative for chest pain and palpitations.  Gastrointestinal: Negative for abdominal distention, abdominal pain, constipation, diarrhea, nausea and vomiting.  Endocrine: Negative for cold intolerance, heat intolerance, polydipsia, polyphagia and polyuria.  Genitourinary: Negative for difficulty urinating, dysuria, flank pain, frequency and urgency.   Musculoskeletal: Negative for arthralgias, back pain and gait problem.       Has had some occasional left mid flank area pain.   Skin: Negative for color change, pallor and rash.  Neurological: Negative for dizziness, weakness, light-headedness, numbness and headaches.  Hematological: Does not bruise/bleed easily.  Psychiatric/Behavioral: Negative for agitation and sleep disturbance. The patient is not nervous/anxious.     Immunization History  Administered Date(s) Administered  . Td 12/19/2008   Pertinent  Health Maintenance Due  Topic Date Due  . INFLUENZA VACCINE  12/26/2019 (Originally 04/28/2019)  . PAP SMEAR-Modifier  12/28/2019   Fall Risk  08/03/2017 04/19/2017 03/23/2017 07/14/2016 09/12/2015  Falls in the past year? No No No No No    Vitals:   06/06/19 1436  BP: 126/80  Pulse: 86  Temp: 98.2 F (36.8 C)  TempSrc: Oral  SpO2: 97%  Weight: (!) 368 lb (166.9 kg)  Height: 5\' 2"  (1.575 m)   Body mass index is 67.31 kg/m. Physical Exam Vitals signs reviewed.  Constitutional:      General: She is not in  acute distress.    Appearance: She is obese. She is not ill-appearing.  HENT:     Head: Normocephalic.     Right Ear: Tympanic membrane, ear canal and external ear normal. There is no impacted cerumen.     Left Ear: Tympanic membrane, ear canal and external ear normal. There is no impacted cerumen.     Nose: Nose normal. No congestion or rhinorrhea.     Mouth/Throat:     Mouth: Mucous membranes are moist.     Pharynx: Oropharynx is clear. No oropharyngeal exudate or posterior oropharyngeal erythema.  Eyes:     General: No scleral icterus.       Right eye: No discharge.        Left eye: No discharge.     Extraocular Movements: Extraocular movements intact.     Conjunctiva/sclera: Conjunctivae normal.     Pupils: Pupils are equal, round, and reactive to light.  Neck:     Musculoskeletal: Normal range of motion. No neck rigidity or muscular tenderness.      Vascular: No carotid bruit.  Cardiovascular:     Rate and Rhythm: Normal rate and regular rhythm.     Pulses: Normal pulses.     Heart sounds: Normal heart sounds. No murmur. No friction rub. No gallop.   Pulmonary:     Effort: Pulmonary effort is normal. No respiratory distress.     Breath sounds: Normal breath sounds. No wheezing, rhonchi or rales.  Chest:     Chest wall: No tenderness.  Abdominal:     General: Bowel sounds are normal. There is no distension.     Palpations: Abdomen is soft. There is no mass.     Tenderness: There is no abdominal tenderness. There is no right CVA tenderness, left CVA tenderness, guarding or rebound.  Musculoskeletal: Normal range of motion.        General: No swelling, tenderness or signs of injury.     Comments: Trace bilateral leg edema.   Lymphadenopathy:     Cervical: No cervical adenopathy.  Skin:    General: Skin is warm and dry.     Coloration: Skin is not pale.     Findings: No bruising, erythema or rash.  Neurological:     Mental Status: She is alert and oriented to person, place, and time.     Sensory: No sensory deficit.     Motor: No weakness.     Coordination: Coordination normal.     Gait: Gait normal.  Psychiatric:        Mood and Affect: Mood normal.        Behavior: Behavior normal.        Thought Content: Thought content normal.        Judgment: Judgment normal.    Labs reviewed: Recent Labs    02/15/19 2142 02/16/19 0703 04/04/19 0955  NA 138 137 137  K 3.9 4.8 4.2  CL 104 104 103  CO2 27 26 27   GLUCOSE 100* 114* 122*  BUN 11 12 8   CREATININE 0.75 0.65 0.74  CALCIUM 8.9 9.1 8.9   Recent Labs    11/08/18 0434 11/21/18 0621 02/16/19 0703  AST 20 18 12*  ALT 23 25 13   ALKPHOS 88 71 87  BILITOT 0.6 0.3 0.3  PROT 8.5* 7.3 7.8  ALBUMIN 3.7 3.1* 3.7   Recent Labs    11/20/18 1436  02/15/19 2142 02/16/19 0703 04/04/19 0955  WBC 14.5*   < > 13.4* 10.1 10.2  NEUTROABS 13.7*  --  9.0*  --  8.9*  HGB  8.5*   < > 10.5* 10.2* 11.0*  HCT 31.8*   < > 39.4 39.1 37.1  MCV 75.5*   < > 72.3* 72.3* 67.7 Repeated and verified X2.*  PLT 445*   < > 341 352 358.0   < > = values in this interval not displayed.   Lab Results  Component Value Date   TSH 0.470 11/21/2018   Lab Results  Component Value Date   HGBA1C 5.0 07/14/2016   Lab Results  Component Value Date   CHOL 104 03/23/2017   HDL 63 03/23/2017   LDLCALC 29 03/23/2017   TRIG 58 03/23/2017   CHOLHDL 1.7 03/23/2017    Significant Diagnostic Results in last 30 days:  Dg Chest Port 1 View  Result Date: 05/19/2019 CLINICAL DATA:  Cough and wheezing. EXAM: PORTABLE CHEST 1 VIEW COMPARISON:  Radiograph 04/04/2019, multiple priors. FINDINGS: Mild cardiomegaly with unchanged mediastinal contours. Pulmonary vasculature is normal. No consolidation, pleural effusion, or pneumothorax. No acute osseous abnormalities are seen. IMPRESSION: Mild cardiomegaly without acute abnormality. Electronically Signed   By: Keith Rake M.D.   On: 05/19/2019 19:05    Assessment/Plan 1. Chronic obstructive pulmonary disease, unspecified COPD type ([place]) Status post ED visit 05/19/2019 for exacerbation.symptoms have improved since then.Smoking cessation advised not ready to quit. - ipratropium-albuterol (DUONEB) 0.5-2.5 (3) MG/3ML SOLN; Take 3 mLs by nebulization every 6 (six) hours as needed.  Dispense: 360 mL; Refill: 0 - albuterol (PROVENTIL) (2.5 MG/3ML) 0.083% nebulizer solution; Take 3 mLs (2.5 mg total) by nebulization every 4 (four) hours as needed for wheezing or shortness of breath.  Dispense: 75 mL; Refill: 0 - CBC/diff,Future - CMP,future   2. Iron deficiency anemia, unspecified iron deficiency anemia type Latest Hgb 10.5 (01/2019).asymptomatic. CBC/diff,Future   3. Moderate persistent asthma with exacerbation Afebrile.Lungs clear to auscultation.continue on current medication.would benefit from smoking cessation but not ready to quit.  -  ipratropium-albuterol (DUONEB) 0.5-2.5 (3) MG/3ML SOLN; Take 3 mLs by nebulization every 6 (six) hours as needed.  Dispense: 360 mL; Refill: 0 - albuterol (PROVENTIL) (2.5 MG/3ML) 0.083% nebulizer solution; Take 3 mLs (2.5 mg total) by nebulization every 4 (four) hours as needed for wheezing or shortness of breath.  Dispense: 75 mL; Refill: 0  4. Morbid Obesity BMI 67.31 low carbohydrate,low saturated fats and high vegetable diet recommended.Also increase physical activity/exercise at least 3 x per week for 30 minutes. - TSH level,Future   5. BMI   BMI 67.31 dietary and lifestyle modification as above.   Family/ staff Communication: Reviewed plan of care with patient.  Labs/tests ordered: - CBC/diff,Future - CMP,future  - TSH level,Future   Next appointment: 4 months for medical management of chronic issues labs prior to visit.   Sandrea Hughs, NP  +

## 2019-06-13 ENCOUNTER — Other Ambulatory Visit: Payer: Self-pay

## 2019-06-13 ENCOUNTER — Encounter: Payer: Self-pay | Admitting: Internal Medicine

## 2019-06-13 ENCOUNTER — Ambulatory Visit (INDEPENDENT_AMBULATORY_CARE_PROVIDER_SITE_OTHER): Payer: 59 | Admitting: Internal Medicine

## 2019-06-13 VITALS — BP 126/70 | HR 87 | Temp 96.3°F | Ht 61.5 in | Wt 367.4 lb

## 2019-06-13 DIAGNOSIS — G4733 Obstructive sleep apnea (adult) (pediatric): Secondary | ICD-10-CM | POA: Diagnosis not present

## 2019-06-13 DIAGNOSIS — G4734 Idiopathic sleep related nonobstructive alveolar hypoventilation: Secondary | ICD-10-CM | POA: Insufficient documentation

## 2019-06-13 DIAGNOSIS — J449 Chronic obstructive pulmonary disease, unspecified: Secondary | ICD-10-CM | POA: Diagnosis not present

## 2019-06-13 DIAGNOSIS — Z720 Tobacco use: Secondary | ICD-10-CM

## 2019-06-13 NOTE — Assessment & Plan Note (Signed)
She continues to smoke. Counseling emphasized. Support available.

## 2019-06-13 NOTE — Progress Notes (Signed)
HPI F smoker followed for Asthma, COPD, Chronic Rhinitis, Urticaria, Morbid Obesity, Iron Def Anemia Office spirometry 2017 by [facility] office- mild obstruction PFT 05/01/14- Severe obstruction. 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 IgE 234,  EOS low on repeated steroid bursts (04/04/19) -----------------------------------------------------------------------------  01/12/2019- 42yoF  smoker  referred by Marlowe Sax, NP for asthma/COPD, having wheezing, SHOB with exertion Medical problem list includes Asthma, COPD, Chronic rhinitis, Urticaria, morbid obesity, Iron def Anemia Eval for OSA by Dr Gwenette Greet in 2015. No sleep study done. Minimizes snoring and somnolence. Office spirometry 2017 by [facility] office- mild obstruction PFT 05/01/14- Severe obstruction. FVC 2.47/ 84%, FEV1 1.46/ 59%, ratio 0.59, FEF25-75% 0.81/ 28%, TLC 98%, DLCO 93%. CXR 11/20/2018- No acute cardiopulmonary findings. Stable borderline cardiac enlargement.  Had quit smoking for a few years but resumed. Also using Chantix. Advair 230 hfa, Singulair,Neb albuterol, albuterol hfa,  Many ED visits for asthma. She is unclear about triggers, saying allergy skin testing was negative. Admits some itchiness and increased wheeze in pollen seasons. Alo with colds, weather changes, strong odors.  Currently using her neb twice daily, Advair twice daily, rescue several times/ week, but needs refills.  She admits during the years she was not smoking she was very much better.  Current Covid restrictions keep her indoors, which seems to help.    06/13/2019- 43  F smoker followed for Asthma, COPD, Chronic Rhinitis, Urticaria, Morbid Obesity, Iron Def Anemia Seen 3x by NP and had ED visit 8/22 for ongoing wheezing dyspnea. HST 04/17/2019- Complex apnea (Central>  Obstructive) 9/ hr with desaturation to 81%, mean 87% (Nocturnal Hypoxemia), body weight 355 lbs. Covid neg 8/22 IgE 234,  EOS low on repeated steroid bursts Advair 250, Neb albuterol, neb Duoneb, Spiriva 1.25, Singulair Declnes flu vax- discussed She is unclear about triggers. Some episodes abrupt, others build gradually over several days. She notes weather, season contribute some. May disturb sleep.  Today feels '80%", with mild congestion but no fever, discolored sputum, cough or wheeze. Sleep study reviewed Needs in-center CPAP titration to assess oxygenation response due to additional dx of Nocturnal Hypoxemia. 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 We discussed option of Biologic Immunomodulation such as Xolair. Side effects discussed. She agrees to try this.  Smoking cessation, Steroid side effects, Covid precautions, Weight  discussed.  CXR 05/19/2019- IMPRESSION: Mild cardiomegaly without acute abnormality.  ROS-see HPI   + = positive Constitutional:    weight loss, night sweats, fevers, chills, fatigue, lassitude. HEENT:    headaches, difficulty swallowing, tooth/dental problems, sore throat,       sneezing, itching, ear ache, nasal congestion, post nasal drip, snoring CV:    chest pain, orthopnea, PND, swelling in lower extremities, anasarca,                                  dizziness, palpitations Resp:  + shortness of breath with exertion or at rest.                productive cough,   +non-productive cough, coughing up of blood.              change in color of mucus.  +wheezing.   Skin:    rash or lesions. GI:  No-   heartburn, indigestion, abdominal pain, nausea, vomiting, diarrhea,                 change in bowel habits, loss of appetite GU: dysuria, change in color of urine, no urgency or frequency.   flank pain. MS:   joint pain, stiffness, decreased range of motion, back pain. Neuro-     nothing unusual Psych:   change in mood or affect.  depression or anxiety.   memory loss.  OBJ- Physical Exam General- Alert, Oriented, Affect-appropriate, Distress- none acute, +morbidly obese 367 lbs Skin- rash-none, lesions- none, excoriation- none Lymphadenopathy- none Head- atraumatic            Eyes- Gross vision intact, PERRLA, conjunctivae and secretions clear            Ears- Hearing, canals-normal            Nose- Clear, no-Septal dev, mucus, polyps, erosion, perforation             Throat- Mallampati III, mucosa clear , drainage- none, tonsils- atrophic Neck- flexible , trachea midline, no stridor , thyroid nl, carotid no bruit Chest - symmetrical excursion , unlabored           Heart/CV- RRR , no murmur , no gallop  , no rub, nl s1 s2                           - JVD- none , edema- none, stasis changes- none, varices- none           Lung- unlabored,  wheeze- none, cough- none , dullness-none, rub- none           Chest wall-  Abd-  Br/ Gen/ Rectal- Not done, not indicated Extrem- cyanosis- none, clubbing, none, atrophy- none, strength- nl Neuro- grossly intact to observation

## 2019-06-13 NOTE — Assessment & Plan Note (Signed)
We need to confirm control of apnea and also the nocturnal hypoxemia. Plan- CPAP titration with attention to oxygenation.

## 2019-06-13 NOTE — Assessment & Plan Note (Addendum)
Obesity-Hypoventilation and Asthma/ COPD contribute, together with her sleep apnea. Plan- CPAP titration sleep study. If PAP therapy doesn't correct the oxygen desaturation, she may need supplemental O2.

## 2019-06-13 NOTE — Assessment & Plan Note (Signed)
Steroid therapy for asthma/copd is aggravating this. Weight loss management may benefit from Bariatric counseling, Healthy Weight program and similar supports when available after Covid restrictions ease.

## 2019-06-13 NOTE — Assessment & Plan Note (Signed)
There are fixed and reversible obstructive components clinically c/w overlap syndrome.  Elevated IgE and repeated need for steroid therapy/ ER suggest she may do well with Biologic. Risk benefit considerations discussed.  Plan- Xolair. Continue other meds for now.

## 2019-06-13 NOTE — Patient Instructions (Signed)
Order- schedule CPAP titration sleep study- add oxygen if needed    Dx OSA, Nocturnal Hypoxemia  Order- Start application process  for Xolair Biologic Injection Therapy for Severe persistent asthma  Call for med refills as needed

## 2019-06-15 ENCOUNTER — Telehealth: Payer: Self-pay | Admitting: Internal Medicine

## 2019-06-15 NOTE — Telephone Encounter (Signed)
ATC patient, unable to reach , left message to call back

## 2019-06-15 NOTE — Telephone Encounter (Signed)
Pt returning call and can be reached @ [phone].April Ellis

## 2019-06-15 NOTE — Telephone Encounter (Signed)
Left message for patient to call back  

## 2019-06-19 NOTE — Telephone Encounter (Signed)
Spoke with pt she states she did a video visit with a doctor on Saturday and they gave her prednisone to take. She is feeling a little better but states she would call us to make an appt if she doesn't feel better after taking the prednisone. Nothing further is needed at this time.

## 2019-06-20 ENCOUNTER — Emergency Department (HOSPITAL_COMMUNITY)
Admission: EM | Admit: 2019-06-20 | Discharge: 2019-06-20 | Disposition: A | Discharge status: Home / Self Care | Payer: 59 | Attending: Emergency Medicine | Admitting: Emergency Medicine

## 2019-06-20 ENCOUNTER — Other Ambulatory Visit: Payer: Self-pay

## 2019-06-20 ENCOUNTER — Encounter (HOSPITAL_COMMUNITY): Payer: Self-pay | Admitting: Emergency Medicine

## 2019-06-20 ENCOUNTER — Emergency Department (HOSPITAL_COMMUNITY): Payer: 59

## 2019-06-20 DIAGNOSIS — Z79899 Other long term (current) drug therapy: Secondary | ICD-10-CM | POA: Diagnosis not present

## 2019-06-20 DIAGNOSIS — J449 Chronic obstructive pulmonary disease, unspecified: Secondary | ICD-10-CM | POA: Diagnosis not present

## 2019-06-20 DIAGNOSIS — Z20828 Contact with and (suspected) exposure to other viral communicable diseases: Secondary | ICD-10-CM | POA: Diagnosis not present

## 2019-06-20 DIAGNOSIS — R05 Cough: Secondary | ICD-10-CM | POA: Diagnosis not present

## 2019-06-20 DIAGNOSIS — R0602 Shortness of breath: Secondary | ICD-10-CM | POA: Diagnosis present

## 2019-06-20 DIAGNOSIS — F17210 Nicotine dependence, cigarettes, uncomplicated: Secondary | ICD-10-CM | POA: Diagnosis not present

## 2019-06-20 DIAGNOSIS — R059 Cough, unspecified: Secondary | ICD-10-CM

## 2019-06-20 MED ORDER — PREDNISONE 20 MG PO TABS
60.0000 mg | ORAL_TABLET | Freq: Once | ORAL | Status: AC
  Administered 2019-06-20: 60 mg via ORAL
  Filled 2019-06-20: qty 3

## 2019-06-20 NOTE — ED Triage Notes (Signed)
Pt reports that yesterday her asthma was flared up and had a headache so didn't go to work. Reports that she needs a Covid test before she can return to work.

## 2019-06-20 NOTE — Discharge Instructions (Addendum)
Your seen today for cough and shortness of breath.  This is likely related to your COPD/asthma.  Continue using your albuterol inhaler, nebulizer treatments and your Spiriva and Advair.  We have sent a COVID test.  This will take a few days to come back.  Make sure you quarantine yourself at home and do not go out until you know your results are negative.  Your chest x-ray was normal.  We will give you another short course of prednisone which will hopefully help your symptoms.  Please follow-up with your regular doctor or return to the emergency department if you have any worsening symptoms.

## 2019-06-20 NOTE — ED Provider Notes (Signed)
[facility] DEPT Provider Note   CSN: [id number] Arrival date & time: 06/20/19  1044     History   Chief Complaint Chief Complaint  Patient presents with  . needs Covid test    HPI April Ellis is a 43 y.o. female.     Patient is a 43 year old female with past medical history of asthma and obesity who presents to the emergency department for shortness of breath.  Patient reports that she is this is been going on since this past Friday.  She reports that this feels like her typical asthma exacerbation.  She has been taking her Spiriva, albuterol and nebulizer treatments.  Reports having to increase her nebulizer treatments.  Reports that she is seen by a provider on this past Friday and was given a course of prednisone.  She reports that this helped a little but not as much as it usually helps.  Reports that she has to fill out a questionnaire for work for COVID-19 and that she screened positive due to her shortness of breath and was told she needs to have a COVID test prior to returning to work.  She denies any fever, chills, chest pain, leg swelling, gastrointestinal symptoms.  Reports that she has not had any known exposure to COVID-19.     Past Medical History:  Diagnosis Date  . Anemia   . Asthma   . Bronchitis   . COPD (chronic obstructive pulmonary disease) ([place])   . Obesity     Patient Active Problem List   Diagnosis Date Noted  . Obstructive sleep apnea 06/13/2019  . Nocturnal hypoxemia 06/13/2019  . Daytime somnolence 04/04/2019  . Dyspnea 02/15/2019  . Acute respiratory failure ([place]) 11/20/2018  . Allergic reactions 08/16/2016  . Chronic rhinitis 08/16/2016  . Asthma, chronic, unspecified asthma severity, with acute exacerbation 09/30/2014  . Leukocytosis 09/30/2014  . Urticaria   . Iron deficiency anemia 08/14/2014  . History of tobacco abuse 08/14/2014  . Atypical chest pain 06/27/2014  . Tobacco abuse 05/12/2014  .  Asthma-COPD overlap syndrome ([place]) 05/01/2014  . Morbid obesity ([place]) 12/19/2008  . ELEVATED BLOOD PRESSURE WITHOUT DIAGNOSIS OF HYPERTENSION 12/19/2008    Past Surgical History:  Procedure Laterality Date  . DENTAL SURGERY     2 teeth pulled, 11/22/14  . NO PAST SURGERIES       OB History   No obstetric history on file.      Home Medications    Prior to Admission medications   Medication Sig Start Date End Date Taking? Authorizing Provider  ADVAIR HFA 230-21 MCG/ACT inhaler TAKE 2 PUFFS BY MOUTH TWICE A DAY 02/20/19   Ngetich, Dinah C, NP  albuterol (VENTOLIN HFA) 108 (90 Base) MCG/ACT inhaler Inhale 2 puffs into the lungs every 4 (four) hours as needed for wheezing or shortness of breath. 05/19/19   Francine Graven, DO  cetirizine (ZYRTEC) 10 MG tablet Take 1 tablet (10 mg total) by mouth daily. 05/08/19   Martyn Ehrich, NP  ferrous sulfate 325 (65 FE) MG EC tablet Take 325 mg by mouth daily.    [provider]  guaiFENesin (MUCINEX) 600 MG 12 hr tablet Take 600 mg by mouth 2 (two) times daily.    [provider]  ipratropium-albuterol (DUONEB) 0.5-2.5 (3) MG/3ML SOLN Take 3 mLs by nebulization every 6 (six) hours as needed. 06/06/19   Ngetich, Dinah C, NP  methylPREDNISolone (MEDROL DOSEPAK) 4 MG TBPK tablet  06/09/19   [provider]  montelukast (SINGULAIR) 10 MG tablet TAKE 1 TABLET BY MOUTH EVERYDAY AT BEDTIME 05/30/19   Ngetich, Dinah C, NP  Tiotropium Bromide Monohydrate (SPIRIVA RESPIMAT) 1.25 MCG/ACT AERS Inhale 2 puffs into the lungs daily. 04/04/19   Martyn Ehrich, NP    Family History Family History  Problem Relation Age of Onset  . Diabetes Mother   . Cancer Mother   . Diabetes Sister   . Hypertension Sister   . Hypertension Sister   . Urticaria Neg Hx   . Immunodeficiency Neg Hx   . Eczema Neg Hx   . Asthma Neg Hx   . Angioedema Neg Hx   . Allergic rhinitis Neg Hx     Social History Social History   Tobacco Use  .  Smoking status: Current Every Day Smoker    Packs/day: 0.25    Years: 15.00    Pack years: 3.75    Types: Cigarettes    Last attempt to quit: 09/16/2014    Years since quitting: 4.7  . Smokeless tobacco: Never Used  . Tobacco comment: smoking 2-3 ciggs per day/taking Chantix 01/12/19; approx. 5 cigs/day as of 06/13/2019  Substance Use Topics  . Alcohol use: No    Alcohol/week: 0.0 standard drinks  . Drug use: No     Allergies   Penicillins   Review of Systems Review of Systems  Constitutional: Negative.   HENT: Negative for congestion and sore throat.   Respiratory: Positive for cough, chest tightness and shortness of breath.   Cardiovascular: Negative.   Gastrointestinal: Negative.   Genitourinary: Negative for dysuria.  Musculoskeletal: Negative for arthralgias and myalgias.  Skin: Negative for rash.  Neurological: Negative for light-headedness and headaches.  Hematological: Does not bruise/bleed easily.     Physical Exam Updated Vital Signs BP 139/62   Pulse 98   Temp 98.6 F (37 C) (Oral)   Resp 18   SpO2 99%   Physical Exam Vitals signs and nursing note reviewed.  Constitutional:      General: She is not in acute distress.    Appearance: Normal appearance. She is obese. She is not ill-appearing, toxic-appearing or diaphoretic.  HENT:     Head: Normocephalic.     Nose: Nose normal.     Mouth/Throat:     Mouth: Mucous membranes are moist.  Eyes:     Conjunctiva/sclera: Conjunctivae normal.  Cardiovascular:     Rate and Rhythm: Normal rate and regular rhythm.  Pulmonary:     Effort: Pulmonary effort is normal. No respiratory distress.     Comments: Bilateral decreased breath sounds.  Exam is limited due to body habitus.  No wheezes, rales, rhonchi appreciated Chest:     Chest wall: No tenderness.  Musculoskeletal:     Right lower leg: No edema.     Left lower leg: No edema.  Skin:    General: Skin is dry.  Neurological:     Mental Status: She is  alert.  Psychiatric:        Mood and Affect: Mood normal.      ED Treatments / Results  Labs (all labs ordered are listed, but only abnormal results are displayed) Labs Reviewed  NOVEL CORONAVIRUS, NAA (HOSP ORDER, SEND-OUT TO REF LAB; TAT 18-24 HRS)    EKG None  Radiology Dg Chest Portable 1 View  Result Date: 06/20/2019 CLINICAL DATA:  Cough, weakness. EXAM: PORTABLE CHEST 1 VIEW COMPARISON:  May 19, 2019 FINDINGS: Heart size remains enlarged with central vascular engorgement. No signs  of consolidation or evidence of pleural effusion. No acute bone process. IMPRESSION: Mild cardiomegaly without acute finding. Electronically Signed   By: Zetta Bills M.D.   On: 06/20/2019 11:51    Procedures Procedures (including critical care time)  Medications Ordered in ED Medications  predniSONE (DELTASONE) tablet 60 mg (60 mg Oral Given 06/20/19 1136)     Initial Impression / Assessment and Plan / ED Course  I have reviewed the triage vital signs and the nursing notes.  Pertinent labs & imaging results that were available during my care of the patient were reviewed by me and considered in my medical decision making (see chart for details).  Clinical Course as of Jun 20 1155  Wed Jun 20, 2019  1131 I think likely this is a COPD exacerbation.  Less likely to be COVID-19 but I will give her test and quarantine instructions.  She does not have a history of diabetes or hypertension and she requests a refill on her prednisone.  I discussed the long-term effects of prednisone but I think it is okay to give her another short course of this to get her through exacerbation.  She has her inhalers and nebulizer treatments at home.  Will obtain a chest x-ray to check for possible pneumonia.  PERC negative and no concern for PE at this time.   [KM]    Clinical Course User Index [KM] Alveria Apley, PA-C       Based on review of vitals, medical screening exam, lab work and/or imaging,  there does not appear to be an acute, emergent etiology for the patient's symptoms. Counseled pt on good return precautions and encouraged both PCP and ED follow-up as needed.  Prior to discharge, I also discussed incidental imaging findings with patient in detail and advised appropriate, recommended follow-up in detail.  Clinical Impression: 1. Cough   2. SOB (shortness of breath)     Disposition: Discharge  Prior to providing a prescription for a controlled substance, I independently reviewed the patient's recent prescription history on the [facility]. The patient had no recent or regular prescriptions and was deemed appropriate for a brief, less than 3 day prescription of narcotic for acute analgesia.  This note was prepared with assistance of Systems analyst. Occasional wrong-word or sound-a-like substitutions may have occurred due to the inherent limitations of voice recognition software.   Final Clinical Impressions(s) / ED Diagnoses   Final diagnoses:  Cough  SOB (shortness of breath)    ED Discharge Orders    None       Kristine Royal 06/20/19 [place], [place], DO 06/20/19 1617

## 2019-06-21 LAB — NOVEL CORONAVIRUS, NAA (HOSP ORDER, SEND-OUT TO REF LAB; TAT 18-24 HRS): SARS-CoV-2, NAA: NOT DETECTED

## 2019-06-25 ENCOUNTER — Ambulatory Visit (HOSPITAL_BASED_OUTPATIENT_CLINIC_OR_DEPARTMENT_OTHER): Payer: 59 | Attending: Internal Medicine | Admitting: Internal Medicine

## 2019-06-25 ENCOUNTER — Other Ambulatory Visit: Payer: Self-pay

## 2019-06-25 VITALS — Temp 97.1°F | Ht 61.0 in | Wt 360.0 lb

## 2019-06-25 DIAGNOSIS — G4734 Idiopathic sleep related nonobstructive alveolar hypoventilation: Secondary | ICD-10-CM | POA: Diagnosis not present

## 2019-06-25 DIAGNOSIS — G4733 Obstructive sleep apnea (adult) (pediatric): Secondary | ICD-10-CM | POA: Diagnosis present

## 2019-06-26 ENCOUNTER — Telehealth: Payer: Self-pay | Admitting: Internal Medicine

## 2019-06-26 NOTE — Telephone Encounter (Signed)
Xolair enrollment forms were filled out and brought to me on 06/15/2019. These were faxed in on the same day.  Summary of benefits were received on 06/19/2019.

## 2019-06-26 NOTE — Telephone Encounter (Signed)
Summary of benefits states that pt's insurance requires a PA. Will need to contact [facility] at [phone] to start PA. [facility] dispensing [facility].  Called [facility] to start PA. Spoke with [employer]. PA has been started and clinical notes have been faxed to [phone] referencing PA # [id number]. Will continue to follow up.

## 2019-06-27 ENCOUNTER — Other Ambulatory Visit: Payer: Self-pay | Admitting: Nurse Practitioner

## 2019-06-27 DIAGNOSIS — J45909 Unspecified asthma, uncomplicated: Secondary | ICD-10-CM

## 2019-06-30 DIAGNOSIS — G4733 Obstructive sleep apnea (adult) (pediatric): Secondary | ICD-10-CM | POA: Diagnosis not present

## 2019-06-30 NOTE — Procedures (Signed)
Patient Name: April Ellis, April Ellis Date: 06/25/2019 Gender: Female D.O.B: Sep 29, 1975 Age (years): 80 Referring Provider: Baird Lyons MD, ABSM Height (inches): 61 Interpreting Physician: Baird Lyons MD, ABSM Weight (lbs): 360 RPSGT: Zadie Rhine BMI: 68 MRN: [id number] Neck Size: 17.00  CLINICAL INFORMATION The patient is referred for a CPAP titration to treat sleep apnea.  Date of NPSG, Split Night or HST:  HST 04/17/2019  AHI 9/ hr, desaturation to 81%, body weight 355 lbs  SLEEP STUDY TECHNIQUE As per the AASM Manual for the Scoring of Sleep and Associated Events v2.3 (April 2016) with a hypopnea requiring 4% desaturations.  The channels recorded and monitored were frontal, central and occipital EEG, electrooculogram (EOG), submentalis EMG (chin), nasal and oral airflow, thoracic and abdominal wall motion, anterior tibialis EMG, snore microphone, electrocardiogram, and pulse oximetry. Continuous positive airway pressure (CPAP) was initiated at the beginning of the study and titrated to treat sleep-disordered breathing.  MEDICATIONS Medications self-administered by patient taken the night of the study : none reported  TECHNICIAN COMMENTS Comments added by technician: [place]. PT WET THE BED Comments added by scorer: N/A  RESPIRATORY PARAMETERS Optimal PAP Pressure (cm): 10 AHI at Optimal Pressure (/hr): 0.0 Overall Minimal O2 (%): 84.0 Supine % at Optimal Pressure (%): 0 Minimal O2 at Optimal Pressure (%): 88.0   SLEEP ARCHITECTURE The study was initiated at 9:53:41 PM and ended at 4:00:55 AM.  Sleep onset time was 18.2 minutes and the sleep efficiency was 80.3%%. The total sleep time was 295 minutes.  The patient spent 4.6%% of the night in stage N1 sleep, 64.6%% in stage N2 sleep, 1.0%% in stage N3 and 29.8% in REM.Stage REM latency was 69.5 minutes  Wake after sleep onset was 54.0. Alpha intrusion was absent. Supine sleep was 24.76%.  CARDIAC DATA  The 2 lead EKG demonstrated sinus rhythm. The mean heart rate was 90.9 beats per minute. Other EKG findings include: None.  LEG MOVEMENT DATA The total Periodic Limb Movements of Sleep (PLMS) were 0. The PLMS index was 0.0. A PLMS index of <15 is considered normal in adults.  IMPRESSIONS - The optimal PAP pressure was 10 cm of water. - Central sleep apnea was not noted during this titration (CAI = 0.0/h). - Moderate oxygen desaturations were observed during this titration (min O2 = 84.0%). Mean sat at CPAP 10 was 91.4%. with less than 1 minute below 88%. - No snoring was audible during this study. - No cardiac abnormalities were observed during this study. - Clinically significant periodic limb movements were not noted during this study. Arousals associated with PLMs were rare.  DIAGNOSIS - Obstructive Sleep Apnea (327.23 [G47.33 ICD-10])  RECOMMENDATIONS - Trial of CPAP therapy on 10 cm H2O or autopap 5-1'5. - Patient used a Small size Resmed Nasal Mask Airfit N20 mask and heated humidification. - Be careful with alcohol, sedatives and other CNS depressants that may worsen sleep apnea and disrupt normal sleep architecture. - Sleep hygiene should be reviewed to assess factors that may improve sleep quality. - Weight management and regular exercise should be initiated or continued.  [Electronically signed] 06/30/2019 10:45 AM  Baird Lyons MD, ABSM Diplomate, American Board of Sleep Medicine   NPI: [id number]                          [place], [facility]  ELECTRONICALLY SIGNED ON:  06/30/2019, 10:41 AM [place] PH: [phone]   FX: [phone] ACCREDITED BY THE AMERICAN ACADEMY OF SLEEP MEDICINE

## 2019-07-03 ENCOUNTER — Telehealth: Payer: Self-pay | Admitting: Internal Medicine

## 2019-07-03 ENCOUNTER — Encounter: Payer: Self-pay | Admitting: Primary Care

## 2019-07-03 ENCOUNTER — Telehealth (INDEPENDENT_AMBULATORY_CARE_PROVIDER_SITE_OTHER): Payer: 59 | Admitting: Primary Care

## 2019-07-03 DIAGNOSIS — J4551 Severe persistent asthma with (acute) exacerbation: Secondary | ICD-10-CM | POA: Diagnosis not present

## 2019-07-03 MED ORDER — SPIRIVA HANDIHALER 18 MCG IN CAPS: 18.0000 ug | ORAL_CAPSULE | Freq: Every day | RESPIRATORY_TRACT | 6 refills | Status: DC

## 2019-07-03 MED ORDER — PREDNISONE 10 MG PO TABS: ORAL_TABLET | ORAL | 0 refills | Status: DC

## 2019-07-03 NOTE — Telephone Encounter (Signed)
Spoke with the pt  She is c/o wheezing, increased SOB and cough off and on over the past couple wks  She is still taking her advair, and has been having to use her rescue inhaler and nebs more often  No body aches or fever  Video visit with Beth today at 2:30

## 2019-07-03 NOTE — Patient Instructions (Addendum)
  Treating you for acute asthma exacerbation with prednisone taper  Rx: Prednisone taper as prescribed (40mg  x 3 days; 30mg  x 3 days; 20mg  x 3 days; 10mg  x 3 days)  Recommendations Sending in [place] paper work, if you have not heard anything from our office call in two weeks  Follow-up Keep follow-up with Dr. Annamaria Boots in December as previously recommended  OR sooner if needed

## 2019-07-03 NOTE — Telephone Encounter (Signed)
Ria Comment, pt had a video visit with [name] and she asked about the status on the PA. Please advise when you can.

## 2019-07-03 NOTE — Progress Notes (Signed)
Virtual Visit via Video Note  I connected with April Ellis on 07/03/19 at  2:30 PM EDT by a video enabled telemedicine application and verified that I am speaking with the correct person using two identifiers.  Location: Patient: Home Provider: Office   I discussed the limitations of evaluation and management by telemedicine and the availability of in person appointments. The patient expressed understanding and agreed to proceed.  History of Present Illness: 43 year female, current smoker. PMH moderate persistent asthma, COPD, chronic rhinitis, obesity, tobacco abuse. Patient of Dr. Annamaria Boots, last seen on on 01/12/19. Recent hospital admission 5/21-5/22 for acute respiratory failure. Called office on 03/21/19 with complaints of trouble breathing, sent in prescription for prednisone taper. Eos absolute 1.1 in May.   Previous [facility] pulmonary encounters: 04/04/2019 Patient presents today for 1-2 week follow-up from most recent asthma exacerbation. Feeling ok today, states that she has good and bad days. She had an asthma flare again on Saturday 7/4 while inside eating breakfast. She took a generic allergy pill which did not help. Reports ear itching followed by sob/wheezing. Denies food allergies. EMS came and she was given duoneb treatment. She is maintained on Advair hfa 23/21, Singulair and prn duonebs. She is still smoking 1/2 ppd. She quit for two years in the past once. Has tried chantix and patches. Needs sleep study for OSA eval, high risk d.t weight. Reports daytime fatigue. Wakes up 1-2 times at night. No known snoring or apnea. Epworth score 22. CXR normal. Labs ok, IgE was elevated. May consider biologic therapy if needed in the future.  05/08/2019 Patient presents today for follow-up. Treated for asthma exacerbation with prednisone taper on 7/31. She is doing better. She was not taking both Advair and Spiriva. Since starting both her breathing has improved. She has some mild sinus congestion.  Uses Flonase as needed and continues Singulair. Hx elevated IgE, most recent eosinophils were zero. Needs to be off prednisone for 1-2 weeks before rechecking. She has been cutting back on her daily cigarette use. She set a quit date for this Friday which is her birthday.   06/13/19- Dr. Annamaria Boots Asthma-COPD overlap Elevated IgE and repeat steroid therapy/ED visit  Smoking cessation recouraged Needs CPAP titration study for nocturnal hypoxia.  Plan start Biologic- Xolair   07/03/2019 Patient contacted today for video visit for acute asthma symptoms x2 days. States that she developed wheezing, chest tightness and shortness of breath on Sunday October 4th. No significant cough. Continues Advair twice daily. Using her rescue inhaler on average 6-7 times a day. Not currently taking spiriva. Treated for exacerbation in August and September requiring ED visits. Last seen on 06/13/19 by Dr. Annamaria Boots, ordered for Xolair secondary to severe persistent asthma. Patient filled out paperwork but has not mailed it- pending approval.   Observations/Objective:  - No significant shortness of breath, wheezing or cough observed over video visit - Able to speak in full sentences, no accessory use    PFTs 2015 - FVC 2.47 (84%), FEV1 1.46 (59%), ratio 59 Severe obstruction   Assessment and Plan:  Severe persistent asthma with acute exacerbation - Increased shortness of breath, chest tightness and wheezing x 2 days with frequent SABA use  - RX prednisone taper (40mg  x 3 days; 30mg  x 3 days; 20mg  x 3 days; 10mg  x 3 days) - Awaiting Xolair paper work and prior-auth  Follow Up Instructions:   2 months with Dr. Annamaria Boots  I discussed the assessment and treatment plan with the patient. The  patient was provided an opportunity to ask questions and all were answered. The patient agreed with the plan and demonstrated an understanding of the instructions.   The patient was advised to call back or seek an in-person evaluation if  the symptoms worsen or if the condition fails to improve as anticipated.  I provided 15 minutes of non-face-to-face time during this encounter.   Martyn Ehrich, NP

## 2019-07-05 NOTE — Telephone Encounter (Signed)
Called [facility] to check the status of the pt's PA. Spoke with Benjamine Mola. Was advised that this PA has been denied. Benjamine Mola is going to fax over the denial letter to my attention. Will follow up once this fax is received.

## 2019-07-05 NOTE — Telephone Encounter (Addendum)
Received the denial letter. States Xolair was denied due to the standard Xolair Policy does not allow coverage of Xolair for asthma unless the patient as an IgE level of at least 30 IU/mL before starting treatment.   All pertinent clinical notes and information was faxed to [place] on 06/26/2019. PA was still denied.  CY - please advise if you would like to appeal this decision. Thanks.

## 2019-07-05 NOTE — Telephone Encounter (Signed)
Her IgE was 234 on 7/8

## 2019-07-06 ENCOUNTER — Telehealth: Payer: Self-pay | Admitting: Primary Care

## 2019-07-06 MED ORDER — ALBUTEROL SULFATE HFA 108 (90 BASE) MCG/ACT IN AERS: 2.0000 | INHALATION_SPRAY | RESPIRATORY_TRACT | 5 refills | Status: DC | PRN

## 2019-07-06 NOTE — Telephone Encounter (Signed)
Called and spoke with Patient. Patient requested a refill for Albuterol inhaler to be sent to [facility]. Albuterol refill sent to requested pharmacy.  Nothing further at this time.

## 2019-07-06 NOTE — Telephone Encounter (Signed)
Received confirmation that the fax was successful. Will follow up.

## 2019-07-06 NOTE — Telephone Encounter (Signed)
I am going to refax all of the pt's clinical notes and information that was faxed originally to [facility]. Will continue to follow up.

## 2019-07-11 ENCOUNTER — Encounter: Payer: Self-pay | Admitting: Family

## 2019-07-11 ENCOUNTER — Ambulatory Visit (INDEPENDENT_AMBULATORY_CARE_PROVIDER_SITE_OTHER): Payer: 59 | Admitting: Family

## 2019-07-11 ENCOUNTER — Other Ambulatory Visit: Payer: Self-pay

## 2019-07-11 VITALS — BP 124/80 | HR 94 | Temp 98.0°F | Ht 61.5 in | Wt 371.4 lb

## 2019-07-11 DIAGNOSIS — R350 Frequency of micturition: Secondary | ICD-10-CM

## 2019-07-11 DIAGNOSIS — Z0289 Encounter for other administrative examinations: Secondary | ICD-10-CM | POA: Diagnosis not present

## 2019-07-11 DIAGNOSIS — R109 Unspecified abdominal pain: Secondary | ICD-10-CM | POA: Diagnosis not present

## 2019-07-11 DIAGNOSIS — R739 Hyperglycemia, unspecified: Secondary | ICD-10-CM

## 2019-07-11 LAB — POCT URINALYSIS DIPSTICK
Blood, UA: NEGATIVE
Glucose, UA: NEGATIVE
Ketones, UA: NEGATIVE
Leukocytes, UA: NEGATIVE
Nitrite, UA: NEGATIVE
Protein, UA: POSITIVE — AB
Spec Grav, UA: 1.015 (ref 1.010–1.025)
Urobilinogen, UA: 1 E.U./dL
pH, UA: 5 (ref 5.0–8.0)

## 2019-07-11 NOTE — Progress Notes (Signed)
Provider: Marlowe Sax FNP-C  Karalyn Kadel, Nelda Bucks, NP  Patient Care Team: Cheyna Retana, Nelda Bucks, NP as PCP - General (Family Medicine)  Extended Emergency Contact Information Primary Emergency Contact: Jolin,Elaine Address: [address]          [place], [place] [address] Johnnette Litter of [place] Phone: [phone] Mobile Phone: [phone] Relation: Sister  Code Status: Full code  Goals of care: Advanced Directive information Advanced Directives 06/25/2019  Does Patient Have a Medical Advance Directive? No  Would patient like information on creating a medical advance directive? No - Patient declined     Chief Complaint  Patient presents with  . Acute Visit    Patient c/o frequent urination denies pain, foul smelling urine, and fever. Pain in left side patient states frequent urination just started last month  and pain in left side has been going on for a few month and  pain comes and goes     HPI:  Pt is a 43 y.o. female seen today at [facility] for an acute visit for evaluation frequent urination X 1 month. she denies pain, foul smelling urine, and fever.She complains of Pain in left side.Pain has been going on for a few month.she states  pain comes and goes.she describe pain as dull rating 6-7 on scale of 10.she denies any fever,chills,nausea or vomiting.Pain improves with increase of water intake.  She would also like her FMLA paperwork re-certification.she was seen in the ED several multiple times this year.she has also several follow up visit with PCP,pulmonologist, multiple ED visit and hospital admission.    Past Medical History:  Diagnosis Date  . Anemia   . Asthma   . Bronchitis   . COPD (chronic obstructive pulmonary disease) ([place])   . Obesity    Past Surgical History:  Procedure Laterality Date  . DENTAL SURGERY     2 teeth pulled, 11/22/14  . NO PAST SURGERIES      Allergies  Allergen Reactions  . Penicillins     Childhood Allergy Did it involve swelling of  the face/tongue/throat, SOB, or low BP? UNK Did it involve sudden or severe rash/hives, skin peeling, or any reaction on the inside of your mouth or nose? UNK Did you need to seek medical attention at a hospital or doctor's office? UNK When did it last happen?more than 10 years If all above answers are "NO", may proceed with cephalosporin use.     Outpatient Encounter Medications as of 07/11/2019  Medication Sig  . ADVAIR HFA 230-21 MCG/ACT inhaler TAKE 2 PUFFS BY MOUTH TWICE A DAY  . albuterol (VENTOLIN HFA) 108 (90 Base) MCG/ACT inhaler Inhale 2 puffs into the lungs every 4 (four) hours as needed for wheezing or shortness of breath.  . cetirizine (ZYRTEC) 10 MG tablet Take 1 tablet (10 mg total) by mouth daily.  . ferrous sulfate 325 (65 FE) MG EC tablet Take 325 mg by mouth daily.  Marland Kitchen guaiFENesin (MUCINEX) 600 MG 12 hr tablet Take 600 mg by mouth as needed.   Marland Kitchen ipratropium-albuterol (DUONEB) 0.5-2.5 (3) MG/3ML SOLN Take 3 mLs by nebulization every 6 (six) hours as needed.  . montelukast (SINGULAIR) 10 MG tablet TAKE 1 TABLET BY MOUTH EVERYDAY AT BEDTIME  . tiotropium (SPIRIVA HANDIHALER) 18 MCG inhalation capsule Place 1 capsule (18 mcg total) into inhaler and inhale daily.  . [DISCONTINUED] predniSONE (DELTASONE) 10 MG tablet Take 4 tabs po daily x 3 days; then 3 tabs daily x3 days; then 2 tabs daily x3 days;  then 1 tab daily x 3 days; then stop   No facility-administered encounter medications on file as of 07/11/2019.     Review of Systems  Constitutional: Negative for appetite change, chills, fatigue and fever.  Respiratory: Negative for chest tightness.        Chronic shortness of breath  Cardiovascular: Negative for chest pain, palpitations and leg swelling.  Gastrointestinal: Negative for abdominal distention, abdominal pain, constipation, diarrhea, nausea and vomiting.  Genitourinary: Positive for flank pain and frequency. Negative for decreased urine volume, difficulty  urinating, dysuria, hematuria and urgency.  Musculoskeletal: Negative for back pain and gait problem.  Psychiatric/Behavioral: Negative for agitation and sleep disturbance. The patient is not nervous/anxious.     Immunization History  Administered Date(s) Administered  . Td 12/19/2008   Pertinent  Health Maintenance Due  Topic Date Due  . INFLUENZA VACCINE  12/26/2019 (Originally 04/28/2019)  . PAP SMEAR-Modifier  12/28/2019   Fall Risk  08/03/2017 04/19/2017 03/23/2017 07/14/2016 09/12/2015  Falls in the past year? No No No No No    Vitals:   07/11/19 0923  BP: 124/80  Pulse: 94  Temp: 98 F (36.7 C)  TempSrc: Temporal  SpO2: 96%  Weight: (!) 371 lb 6.4 oz (168.5 kg)  Height: 5' 1.5" (1.562 m)   Body mass index is 69.04 kg/m. Physical Exam Vitals signs reviewed.  Constitutional:      General: She is not in acute distress.    Appearance: She is morbidly obese. She is not ill-appearing.  HENT:     Head: Normocephalic.     Mouth/Throat:     Mouth: Mucous membranes are moist.     Pharynx: Oropharynx is clear. No oropharyngeal exudate or posterior oropharyngeal erythema.  Eyes:     General: No scleral icterus.       Right eye: No discharge.        Left eye: No discharge.     Conjunctiva/sclera: Conjunctivae normal.     Pupils: Pupils are equal, round, and reactive to light.  Cardiovascular:     Rate and Rhythm: Normal rate and regular rhythm.     Pulses: Normal pulses.     Heart sounds: Normal heart sounds. No murmur. No friction rub. No gallop.   Pulmonary:     Effort: Pulmonary effort is normal. No respiratory distress.     Breath sounds: Normal breath sounds. No wheezing, rhonchi or rales.  Chest:     Chest wall: No tenderness.  Abdominal:     General: Bowel sounds are normal. There is no distension.     Palpations: Abdomen is soft. There is no mass.     Tenderness: There is no abdominal tenderness. There is left CVA tenderness. There is no right CVA tenderness,  guarding or rebound.  Musculoskeletal: Normal range of motion.        General: No swelling or tenderness.     Right lower leg: No edema.     Left lower leg: No edema.  Skin:    General: Skin is warm and dry.     Coloration: Skin is not pale.     Findings: No bruising or erythema.  Neurological:     Mental Status: She is alert and oriented to person, place, and time.     Cranial Nerves: No cranial nerve deficit.     Sensory: No sensory deficit.     Motor: No weakness.     Coordination: Coordination normal.     Gait: Gait normal.  Psychiatric:  Mood and Affect: Mood normal.        Behavior: Behavior normal.        Thought Content: Thought content normal.        Judgment: Judgment normal.    Labs reviewed: Recent Labs    02/15/19 2142 02/16/19 0703 04/04/19 0955  NA 138 137 137  K 3.9 4.8 4.2  CL 104 104 103  CO2 '27 26 27  ' GLUCOSE 100* 114* 122*  BUN '11 12 8  ' CREATININE 0.75 0.65 0.74  CALCIUM 8.9 9.1 8.9   Recent Labs    11/08/18 0434 11/21/18 0621 02/16/19 0703  AST 20 18 12*  ALT '23 25 13  ' ALKPHOS 88 71 87  BILITOT 0.6 0.3 0.3  PROT 8.5* 7.3 7.8  ALBUMIN 3.7 3.1* 3.7   Recent Labs    11/20/18 1436  02/15/19 2142 02/16/19 0703 04/04/19 0955  WBC 14.5*   < > 13.4* 10.1 10.2  NEUTROABS 13.7*  --  9.0*  --  8.9*  HGB 8.5*   < > 10.5* 10.2* 11.0*  HCT 31.8*   < > 39.4 39.1 37.1  MCV 75.5*   < > 72.3* 72.3* 67.7 Repeated and verified X2.*  PLT 445*   < > 341 352 358.0   < > = values in this interval not displayed.   Lab Results  Component Value Date   TSH 0.470 11/21/2018   Lab Results  Component Value Date   HGBA1C 5.0 07/14/2016   Lab Results  Component Value Date   CHOL 104 03/23/2017   HDL 63 03/23/2017   LDLCALC 29 03/23/2017   TRIG 58 03/23/2017   CHOLHDL 1.7 03/23/2017    Significant Diagnostic Results in last 30 days:  Dg Chest Portable 1 View  Result Date: 06/20/2019 CLINICAL DATA:  Cough, weakness. EXAM: PORTABLE CHEST 1  VIEW COMPARISON:  May 19, 2019 FINDINGS: Heart size remains enlarged with central vascular engorgement. No signs of consolidation or evidence of pleural effusion. No acute bone process. IMPRESSION: Mild cardiomegaly without acute finding. Electronically Signed   By: Zetta Bills M.D.   On: 06/20/2019 11:51    Assessment/Plan  1. Urinary frequency Increased urine frequency with left flank pain. - POC Urinalysis Dipstick showed clear pale yellow urine,nitrite leukocytes  Negative but protein positive. - CBC with Differential/Platelet  2. Acute left flank pain Left CVA tenderness. - BMP with eGFR([facility]) - CBC with Differential/Platelet - US Renal; Future  3. Hyperglycemia Latest lab glucose 122 fasting. - Hemoglobin A1c  4. Encounter for completion of FMLA paperwork with patient  Request FMLA paperwork re-certification has had 3-4 times visit to PCP,pulmonologist ,ED and hospital admission. FMLA paperwork completed.  Family/ staff Communication: Reviewed plan of care with patient.   Labs/tests ordered:  - BMP with eGFR([facility]) - CBC with Differential/Platelet  - Hemoglobin A1c - US Renal; Future Emerald Shor C Winston Misner, NP

## 2019-07-12 ENCOUNTER — Other Ambulatory Visit: Payer: Self-pay | Admitting: Internal Medicine

## 2019-07-12 DIAGNOSIS — G4733 Obstructive sleep apnea (adult) (pediatric): Secondary | ICD-10-CM

## 2019-07-12 LAB — BASIC METABOLIC PANEL WITH GFR
BUN: 9 mg/dL (ref 7–25)
CO2: 31 mmol/L (ref 20–32)
Calcium: 8.6 mg/dL (ref 8.6–10.2)
Chloride: 104 mmol/L (ref 98–110)
Creat: 0.67 mg/dL (ref 0.50–1.10)
GFR, Est African American: 126 mL/min/{1.73_m2} (ref >=60)
GFR, Est Non African American: 108 mL/min/{1.73_m2} (ref >=60)
Glucose, Bld: 92 mg/dL (ref 65–99)
Potassium: 4.3 mmol/L (ref 3.5–5.3)
Sodium: 140 mmol/L (ref 135–146)

## 2019-07-12 LAB — CBC WITH DIFFERENTIAL/PLATELET
Absolute Monocytes: 611 cells/uL (ref 200–950)
Basophils Absolute: 68 cells/uL (ref 0–200)
Basophils Relative: 0.7 %
Eosinophils Absolute: 407 cells/uL (ref 15–500)
Eosinophils Relative: 4.2 %
HCT: 37 % (ref 35.0–45.0)
Hemoglobin: 10.8 g/dL — ABNORMAL LOW (ref 11.7–15.5)
Lymphs Abs: 2590 cells/uL (ref 850–3900)
MCH: 21.7 pg — ABNORMAL LOW (ref 27.0–33.0)
MCHC: 29.2 g/dL — ABNORMAL LOW (ref 32.0–36.0)
MCV: 74.3 fL — ABNORMAL LOW (ref 80.0–100.0)
MPV: 9.4 fL (ref 7.5–12.5)
Monocytes Relative: 6.3 %
Neutro Abs: 6024 cells/uL (ref 1500–7800)
Neutrophils Relative %: 62.1 %
Platelets: 342 10*3/uL (ref 140–400)
RBC: 4.98 10*6/uL (ref 3.80–5.10)
RDW: 18.4 % — ABNORMAL HIGH (ref 11.0–15.0)
Total Lymphocyte: 26.7 %
WBC: 9.7 10*3/uL (ref 3.8–10.8)

## 2019-07-12 LAB — HEMOGLOBIN A1C
Hgb A1c MFr Bld: 5.1 % of total Hgb (ref <5.7)
Mean Plasma Glucose: 100 (calc)
eAG (mmol/L): 5.5 (calc)

## 2019-07-13 NOTE — Telephone Encounter (Signed)
Received fax with PA request and requesting additional OV notes, labs. PA request form completed, with requested OV notes, labs faxed to [facility], [phone]. Confirmation received. Will follow up.

## 2019-07-18 NOTE — Telephone Encounter (Signed)
Called [employer] at [phone] to check the status of the pt's PA. Spoke with [name]. Was advised that the current PA had been "auto closed." A new PA was started on Cover My Meds as [name] could not initiate a new PA over the phone. Key: [id number] Will continue to follow up.

## 2019-07-19 NOTE — Telephone Encounter (Signed)
Checked Cover My Meds. No PA decision has been made as of today. Will continue to follow up.

## 2019-07-23 ENCOUNTER — Telehealth: Payer: Self-pay | Admitting: Internal Medicine

## 2019-07-23 DIAGNOSIS — R0602 Shortness of breath: Secondary | ICD-10-CM

## 2019-07-23 MED ORDER — PREDNISONE 10 MG PO TABS: ORAL_TABLET | ORAL | 0 refills | Status: DC

## 2019-07-23 NOTE — Telephone Encounter (Signed)
Offer prednisone 10 mg, #20   4 X 2 DAYS, 3 X 2 DAYS, 2 X 2 DAYS, 1 X 2 DAYS  

## 2019-07-23 NOTE — Telephone Encounter (Signed)
Called the patient and advised of response received from Dr. Annamaria Boots. She confirmed the pharmacy listed. Advised her that if she does not improve after taking the prednisone to call the office to let us know. Patient voiced understanding.  Prescription sent. Nothing further needed at this time.

## 2019-07-23 NOTE — Telephone Encounter (Signed)
Spoke with pt. States that she is having increased shortness of breath and wheezing. Denies fever/chills, congestion/runny nose, active cough, sore throat, severe headache, joint pain, unexplained muscle aches, loss of taste or smell, rash, N/V/D, abdominal pain, redness around/in the eye, increased weakness or unexplained bruising or bleeding.  Symptoms started 3-4 days ago. Pt would like to have a prednisone taper. We are still currently working on getting pt's Xolair approved with her insurance.  CY - please advise. Thanks.

## 2019-07-23 NOTE — Telephone Encounter (Signed)
Patient is returning phone call.  Patient phone number is [phone].

## 2019-07-23 NOTE — Telephone Encounter (Signed)
Attempted to call patient, no answer, left message to call back.  

## 2019-07-24 ENCOUNTER — Telehealth: Payer: Self-pay | Admitting: Internal Medicine

## 2019-07-24 MED ORDER — OMALIZUMAB 150 MG/ML ~~LOC~~ SOSY: 300.0000 mg | PREFILLED_SYRINGE | SUBCUTANEOUS | 12 refills | Status: DC

## 2019-07-24 MED ORDER — EPINEPHRINE 0.3 MG/0.3ML IJ SOAJ: 0.3000 mg | INTRAMUSCULAR | 5 refills | Status: DC | PRN

## 2019-07-24 NOTE — Telephone Encounter (Signed)
Called [employer] to check the status of this PA. Spoke to [place]. They did receive the clinical information that was originally faxed. More clinical questions have been answered with Tonya. PA has been approved 07/24/2019 - 01/22/2020. Approval #[id number]. Specialty Pharmacy is [facility]. Rx has been sent to them. Will call them back later this week to set up shipment.  LMTCB x1 for pt to make her aware of this information.

## 2019-07-24 NOTE — Telephone Encounter (Signed)
See telephone encounter (06/26/2019)

## 2019-07-24 NOTE — Telephone Encounter (Signed)
Spoke with pt. She is aware that Xolair has been approved. Pt is aware of our office policy for first injections >> 2 hour wait, EpiPen. EpiPen has been sent to her pharmacy. Advised her that I would call her once we knew when her medication would be coming in. Will call [facility] to set up shipment later this week.

## 2019-07-26 NOTE — Telephone Encounter (Signed)
Called [facility] to set up shipment. Spoke with Crystal. They still need to speak to the pt to set up her patient profile and get consent to ship. Crystal went ahead and sent up shipment so when the pt's profile is set up the medication will ship.  Xolair Prefilled Syringe Order: 150mg  Prefilled Syringe:  #4 75mg  Prefilled Syringe: N/A Ordered Date: 07/26/2019 Expected date of arrival: 08/02/2019 Ordered by: Desmond Dike, [place]  Specialty Pharmacy: [facility]

## 2019-07-30 NOTE — Telephone Encounter (Signed)
Called [facility] to follow up on pt's shipment. I was on a 10+ minute hold with no one coming to the line. Will try back.

## 2019-07-31 NOTE — Telephone Encounter (Signed)
Called [facility] to follow up on pt's shipment. The shipment has been set up but they can't "release" it until the pt calls in to give her consent to ship. [facility] has been trying to reach the pt but has been unsuccessful.  LMTCB x1 for pt.

## 2019-08-01 ENCOUNTER — Telehealth: Payer: Self-pay

## 2019-08-01 ENCOUNTER — Other Ambulatory Visit: Payer: Self-pay | Admitting: Family

## 2019-08-01 DIAGNOSIS — J4541 Moderate persistent asthma with (acute) exacerbation: Secondary | ICD-10-CM

## 2019-08-01 DIAGNOSIS — J449 Chronic obstructive pulmonary disease, unspecified: Secondary | ICD-10-CM

## 2019-08-01 NOTE — Telephone Encounter (Signed)
Called and left message for patient to call office and schedule appointment for fmla paperwork.

## 2019-08-01 NOTE — Telephone Encounter (Signed)
Patient called to schedule appointment per Dinah to discuss FMLA

## 2019-08-01 NOTE — Telephone Encounter (Signed)
Patient called back and spoke with Gay Filler CMA, appointment scheduled for visit with provider tomorrow.

## 2019-08-01 NOTE — Telephone Encounter (Signed)
Need visit to fill paper work with patient present.

## 2019-08-01 NOTE — Telephone Encounter (Signed)
Patient called and stated last paper work that was filled out was denied and patient would like to have paper work filled out again. States some information was missing such as dates and time. Patient would like to know if she could bring paper work to office and have it filled out again with information that was missing. Please advise

## 2019-08-02 ENCOUNTER — Ambulatory Visit
Admission: RE | Admit: 2019-08-02 | Discharge: 2019-08-02 | Disposition: A | Discharge status: Home / Self Care | Payer: 59 | Source: Ambulatory Visit | Attending: Family | Admitting: Family

## 2019-08-02 ENCOUNTER — Ambulatory Visit (INDEPENDENT_AMBULATORY_CARE_PROVIDER_SITE_OTHER): Payer: 59 | Admitting: Family

## 2019-08-02 ENCOUNTER — Other Ambulatory Visit: Payer: Self-pay

## 2019-08-02 ENCOUNTER — Encounter: Payer: Self-pay | Admitting: Family

## 2019-08-02 VITALS — BP 118/80 | HR 93 | Temp 97.8°F | Ht 61.5 in | Wt 372.8 lb

## 2019-08-02 DIAGNOSIS — J4541 Moderate persistent asthma with (acute) exacerbation: Secondary | ICD-10-CM

## 2019-08-02 DIAGNOSIS — R109 Unspecified abdominal pain: Secondary | ICD-10-CM

## 2019-08-02 DIAGNOSIS — Z0289 Encounter for other administrative examinations: Secondary | ICD-10-CM | POA: Diagnosis not present

## 2019-08-02 DIAGNOSIS — J449 Chronic obstructive pulmonary disease, unspecified: Secondary | ICD-10-CM | POA: Diagnosis not present

## 2019-08-02 MED ORDER — IPRATROPIUM-ALBUTEROL 0.5-2.5 (3) MG/3ML IN SOLN: RESPIRATORY_TRACT | 3 refills | Status: DC

## 2019-08-02 NOTE — Progress Notes (Signed)
Provider: Marlowe Sax FNP-C  Ngetich, Nelda Bucks, NP  Patient Care Team: Ngetich, Nelda Bucks, NP as PCP - General (Family Medicine)  Extended Emergency Contact Information Primary Emergency Contact: Crammer,Elaine Address: [address]          [place], [place] [address] Johnnette Litter of [place] Phone: [phone] Mobile Phone: [phone] Relation: Sister  Code Status:  Full Code  Goals of care: Advanced Directive information Advanced Directives 06/25/2019  Does Patient Have a Medical Advance Directive? No  Would patient like information on creating a medical advance directive? No - Patient declined     Chief Complaint  Patient presents with  . Form Completion    FLMA paper work needing filled out      HPI:  Pt is a 43 y.o. female seen today for an acute visit for FMLA paper work completion.she states previous FMLA paper work was denied by her work place needs specific frequency,duration and number of hours or days per week or month indicated.she has been in the ED visit/hospital admission multiple times this year.Has had several follow up visit with PCP,Pulmonologist   Left upper back pain - had previous ordered ultrasound done prior to this visit.Her results are still pending.  Asthma - status post treatment with prednisone ordered by Pulmonologist one week ago.still having some chest tightness.Used her inhaler prior to visit.denies any fever or chills.No contact with person COVID-19.   OSA - uses C-PAP whenever she remembers if she does not fall asleep.  Smoking 2-3 cigarettes per day.states continues to try to quit.Has tried chantix,nicotine patch and gum.  Past Medical History:  Diagnosis Date  . Anemia   . Asthma   . Bronchitis   . COPD (chronic obstructive pulmonary disease) ([place])   . Obesity    Past Surgical History:  Procedure Laterality Date  . DENTAL SURGERY     2 teeth pulled, 11/22/14  . NO PAST SURGERIES      Allergies  Allergen Reactions  .  Penicillins     Childhood Allergy Did it involve swelling of the face/tongue/throat, SOB, or low BP? UNK Did it involve sudden or severe rash/hives, skin peeling, or any reaction on the inside of your mouth or nose? UNK Did you need to seek medical attention at a hospital or doctor's office? UNK When did it last happen?more than 10 years If all above answers are "NO", may proceed with cephalosporin use.     Outpatient Encounter Medications as of 08/02/2019  Medication Sig  . ADVAIR HFA 230-21 MCG/ACT inhaler TAKE 2 PUFFS BY MOUTH TWICE A DAY  . albuterol (VENTOLIN HFA) 108 (90 Base) MCG/ACT inhaler Inhale 2 puffs into the lungs every 4 (four) hours as needed for wheezing or shortness of breath.  . cetirizine (ZYRTEC) 10 MG tablet Take 1 tablet (10 mg total) by mouth daily.  Marland Kitchen EPINEPHrine 0.3 mg/0.3 mL IJ SOAJ injection Inject 0.3 mLs (0.3 mg total) into the muscle as needed for anaphylaxis.  . ferrous sulfate 325 (65 FE) MG EC tablet Take 325 mg by mouth daily.  Marland Kitchen guaiFENesin (MUCINEX) 600 MG 12 hr tablet Take 600 mg by mouth as needed.   Marland Kitchen ipratropium-albuterol (DUONEB) 0.5-2.5 (3) MG/3ML SOLN INHALE 1 VIAL BY MOUTH VIA NEBULIZER EVERY 6 HOURS AS NEEDED  . montelukast (SINGULAIR) 10 MG tablet TAKE 1 TABLET BY MOUTH EVERYDAY AT BEDTIME  . omalizumab Arvid Right) 150 MG/ML prefilled syringe Inject 300 mg into the skin every 14 (fourteen) days.  Marland Kitchen tiotropium (SPIRIVA HANDIHALER)  18 MCG inhalation capsule Place 1 capsule (18 mcg total) into inhaler and inhale daily.  . [DISCONTINUED] predniSONE (DELTASONE) 10 MG tablet 4 tablets x 2 days, 3 tablets x 2 days, 2 tablets x 2 days, 1 tablet x 2 day then stop   No facility-administered encounter medications on file as of 08/02/2019.     Review of Systems  Constitutional: Negative for appetite change, chills and fatigue.  HENT: Negative for congestion, rhinorrhea, sinus pressure, sinus pain, sneezing and sore throat.   Respiratory: Positive for  wheezing. Negative for cough and chest tightness.   Cardiovascular: Negative for chest pain, palpitations and leg swelling.  Gastrointestinal: Negative for abdominal distention, abdominal pain, constipation, diarrhea, nausea and vomiting.  Genitourinary: Negative for difficulty urinating, dysuria, flank pain, frequency and urgency.  Musculoskeletal: Negative for gait problem.       Left flank pain   Skin: Negative for color change, pallor and rash.  Neurological: Negative for dizziness, light-headedness and headaches.  Hematological: Does not bruise/bleed easily.  Psychiatric/Behavioral: Negative for agitation and sleep disturbance. The patient is not nervous/anxious.     Immunization History  Administered Date(s) Administered  . Td 12/19/2008   Pertinent  Health Maintenance Due  Topic Date Due  . INFLUENZA VACCINE  12/26/2019 (Originally 04/28/2019)  . PAP SMEAR-Modifier  12/28/2019   Fall Risk  08/03/2017 04/19/2017 03/23/2017 07/14/2016 09/12/2015  Falls in the past year? No No No No No    Vitals:   08/02/19 1000  BP: 118/80  Pulse: 93  Temp: 97.8 F (36.6 C)  TempSrc: Temporal  SpO2: 96%  Weight: (!) 372 lb 12.8 oz (169.1 kg)  Height: 5' 1.5" (1.562 m)   Body mass index is 69.3 kg/m. Physical Exam Vitals signs reviewed.  Constitutional:      General: She is not in acute distress.    Appearance: She is morbidly obese. She is not ill-appearing.  HENT:     Head: Normocephalic.     Mouth/Throat:     Mouth: Mucous membranes are moist.     Pharynx: Oropharynx is clear. No oropharyngeal exudate or posterior oropharyngeal erythema.  Eyes:     General: No scleral icterus.       Right eye: No discharge.        Left eye: No discharge.     Conjunctiva/sclera: Conjunctivae normal.     Pupils: Pupils are equal, round, and reactive to light.  Neck:     Musculoskeletal: Normal range of motion. No neck rigidity or muscular tenderness.     Vascular: No carotid bruit.   Cardiovascular:     Rate and Rhythm: Normal rate and regular rhythm.     Pulses: Normal pulses.     Heart sounds: Normal heart sounds. No murmur. No friction rub. No gallop.   Pulmonary:     Effort: Pulmonary effort is normal. No respiratory distress.     Breath sounds: Normal breath sounds. No wheezing, rhonchi or rales.  Chest:     Chest wall: No tenderness.  Abdominal:     General: Bowel sounds are normal. There is no distension.     Palpations: Abdomen is soft. There is no mass.     Tenderness: There is no abdominal tenderness. There is left CVA tenderness. There is no right CVA tenderness, guarding or rebound.  Musculoskeletal: Normal range of motion.        General: No swelling, tenderness or signs of injury.     Right lower leg: No edema.  Left lower leg: No edema.  Lymphadenopathy:     Cervical: No cervical adenopathy.  Skin:    General: Skin is warm.     Coloration: Skin is not pale.     Findings: No bruising, erythema or rash.  Neurological:     Mental Status: She is alert and oriented to person, place, and time.     Cranial Nerves: No cranial nerve deficit.     Sensory: No sensory deficit.     Motor: No weakness.     Coordination: Coordination normal.     Gait: Gait normal.  Psychiatric:        Mood and Affect: Mood normal.        Behavior: Behavior normal.        Thought Content: Thought content normal.        Judgment: Judgment normal.    Labs reviewed: Recent Labs    02/16/19 0703 04/04/19 0955 07/11/19 1011  NA 137 137 140  K 4.8 4.2 4.3  CL 104 103 104  CO2 26 27 31   GLUCOSE 114* 122* 92  BUN 12 8 9   CREATININE 0.65 0.74 0.67  CALCIUM 9.1 8.9 8.6   Recent Labs    11/08/18 0434 11/21/18 0621 02/16/19 0703  AST 20 18 12*  ALT 23 25 13   ALKPHOS 88 71 87  BILITOT 0.6 0.3 0.3  PROT 8.5* 7.3 7.8  ALBUMIN 3.7 3.1* 3.7   Recent Labs    02/15/19 2142 02/16/19 0703 04/04/19 0955 07/11/19 1011  WBC 13.4* 10.1 10.2 9.7  NEUTROABS 9.0*  --   8.9* 6,024  HGB 10.5* 10.2* 11.0* 10.8*  HCT 39.4 39.1 37.1 37.0  MCV 72.3* 72.3* 67.7 Repeated and verified X2.* 74.3*  PLT 341 352 358.0 342   Lab Results  Component Value Date   TSH 0.470 11/21/2018   Lab Results  Component Value Date   HGBA1C 5.1 07/11/2019   Lab Results  Component Value Date   CHOL 104 03/23/2017   HDL 63 03/23/2017   LDLCALC 29 03/23/2017   TRIG 58 03/23/2017   CHOLHDL 1.7 03/23/2017    Significant Diagnostic Results in last 30 days:  No results found.  Assessment/Plan 1. Acute left flank pain No s/sx of UTI.previous urine specimen negative for UTI.Had previous renal ultrasounds done prior to today's visit.Results pending.   2. Encounter for completion of form with patient FMLA paperwork re-certified had multiple ED visit ,hospitalization and follow up visit with PCP and specialist.   3. Chronic obstructive pulmonary disease, unspecified COPD type ([place]) Breathing stable.continue to smoke 2-3 cigarettes per day.Smoking cessation advised. - ipratropium-albuterol (DUONEB) 0.5-2.5 (3) MG/3ML SOLN; INHALE 1 VIAL BY MOUTH VIA NEBULIZER EVERY 6 HOURS AS NEEDED  Dispense: 360 mL; Refill: 3  4. Moderate persistent asthma with exacerbation Continue to require inhalers.bilateral lungs CTA.continue to follow up with Pulmonologist as directed.  - ipratropium-albuterol (DUONEB) 0.5-2.5 (3) MG/3ML SOLN; INHALE 1 VIAL BY MOUTH VIA NEBULIZER EVERY 6 HOURS AS NEEDED  Dispense: 360 mL; Refill: 3  Family/ staff Communication: Reviewed plan of care with patient.  Labs/tests ordered: None  Next appt: PRN   Sandrea Hughs, NP

## 2019-08-02 NOTE — Telephone Encounter (Signed)
Xolair Prefilled Syringe Received:  150mg  Prefilled Syringe >> quantity #4, lot # [id number], exp date 02/2020 75mg  Prefilled Syringe >> N/A Medication arrival date: 08/02/2019  Received by: Desmond Dike, [place] x1 for pt to set up her first appointment.

## 2019-08-02 NOTE — Telephone Encounter (Signed)
Spoke with pt. She has been scheduled for her first Xolair injection. Pt is aware of our office policy when it comes to their first injection >> 2 hours wait, Epipen.  Rx has already been sent in for Epi. Nothing further was needed at this time.

## 2019-08-06 ENCOUNTER — Telehealth: Payer: Self-pay

## 2019-08-06 ENCOUNTER — Telehealth: Payer: Self-pay | Admitting: *Deleted

## 2019-08-06 NOTE — Telephone Encounter (Signed)
We have a copy of FMLA Rodena Piety will Print then add the dates and I will  Initial.Patient can pick up or provide a fax number to send forms.

## 2019-08-06 NOTE — Telephone Encounter (Signed)
Printed and given to [employer]

## 2019-08-06 NOTE — Telephone Encounter (Signed)
Patient called to ask if provider could take the form she has for her FMLA and add the dates of her last visits to the form and initial each entry from the last 6 months to be resubmitted

## 2019-08-06 NOTE — Telephone Encounter (Signed)
Patient called and left message on clinical intake and stated that her FMLA Paperwork was DENIED due to it needs to include dates for appointments for the last 6 months on the form. Please Advise. (Can form be printed off in media and added)

## 2019-08-06 NOTE — Telephone Encounter (Signed)
Okay 

## 2019-08-06 NOTE — Telephone Encounter (Signed)
Can we add dates on current FMLA and initial or need a new one filled.

## 2019-08-06 NOTE — Telephone Encounter (Signed)
We can print off old FMLA add the dates and initial and fax back.

## 2019-08-07 NOTE — Telephone Encounter (Signed)
Called patient back to inform her that  April Ellis has the form and is aware of what is needed to fill out and as soon as she finishes filling it out we will call her to let her know it's ready for pickup

## 2019-08-08 ENCOUNTER — Other Ambulatory Visit: Payer: Self-pay

## 2019-08-08 ENCOUNTER — Ambulatory Visit (INDEPENDENT_AMBULATORY_CARE_PROVIDER_SITE_OTHER): Payer: 59

## 2019-08-08 DIAGNOSIS — J4551 Severe persistent asthma with (acute) exacerbation: Secondary | ICD-10-CM | POA: Diagnosis not present

## 2019-08-08 MED ORDER — OMALIZUMAB 150 MG/ML ~~LOC~~ SOSY
300.0000 mg | PREFILLED_SYRINGE | Freq: Once | SUBCUTANEOUS | Status: AC
  Administered 2019-08-08: 300 mg via SUBCUTANEOUS

## 2019-08-08 NOTE — Progress Notes (Signed)
Patient presented to the office today for first-time Xolair injection.  Primary Pulmonologist: Baird Lyons MD Medication name: Xolair Strength: 300mg  Site(s): L and R Arm  Epi pen/Auvi-Q visible during appointment: Yes  Time of injection: 1400  Patient evaluated every 15-20 minutes per protocol x2 hours.  1st check: 1420 Evaluation: No Reactions  2nd check: 1440 Evaluation: No Reactions  3rd check: 1500 Evaluation: No Reactions  4th check: 1520 Evaluation: No Reactions  5th check: 1540 Evaluation: No Reactions  6th check: 1600 Evaluation: No Reactions

## 2019-08-21 ENCOUNTER — Other Ambulatory Visit: Payer: Self-pay

## 2019-08-21 ENCOUNTER — Ambulatory Visit (INDEPENDENT_AMBULATORY_CARE_PROVIDER_SITE_OTHER): Payer: 59 | Admitting: Family

## 2019-08-21 ENCOUNTER — Encounter: Payer: Self-pay | Admitting: Family

## 2019-08-21 DIAGNOSIS — J45909 Unspecified asthma, uncomplicated: Secondary | ICD-10-CM | POA: Diagnosis not present

## 2019-08-21 MED ORDER — ADVAIR HFA 230-21 MCG/ACT IN AERO: INHALATION_SPRAY | RESPIRATORY_TRACT | 2 refills | Status: DC

## 2019-08-21 MED ORDER — PREDNISONE 10 MG PO TABS: ORAL_TABLET | ORAL | 0 refills | Status: DC

## 2019-08-21 MED ORDER — FERROUS SULFATE 325 (65 FE) MG PO TBEC: 325.0000 mg | DELAYED_RELEASE_TABLET | Freq: Every day | ORAL | 1 refills | Status: DC

## 2019-08-21 NOTE — Progress Notes (Signed)
This service is provided via telemedicine  No vital signs collected/recorded due to the encounter was a telemedicine visit.   Location of patient (ex: home, work):  work  Patient consents to a telephone visit:  Yes  Location of the provider (ex: office, home): Office   Name of any referring provider:  Marlowe Sax, NP   Names of all persons participating in the telemedicine service and their role in the encounter:  Marlowe Sax, NP, Ruthell Rummage CMA, and patient   Time spent on call:  Ruthell Rummage CMA, spent 5 minutes on phone with patient    Provider:   FNP-C  , Nelda Bucks, NP  Patient Care Team: , Nelda Bucks, NP as PCP - General (Family Medicine)  Extended Emergency Contact Information Primary Emergency Contact: Goers,Elaine Address: [address]          [place],  [address] Johnnette Litter of [place] Phone: [phone] Mobile Phone: [phone] Relation: Sister  Code Status:Full Code  Goals of care: Advanced Directive information Advanced Directives 06/25/2019  Does Patient Have a Medical Advance Directive? No  Would patient like information on creating a medical advance directive? No - Patient declined     Chief Complaint  Patient presents with  . Acute Visit    Asthma Flare up patient would like to get prednisone. Patient states started Sunday night and patient states that it has gotten worse.     HPI:  Pt is a 43 y.o. female seen today for an acute visit for evaluation of asthma flare up.she states has had worsening shortness of breath and wheezing since Sunday night.she has used her Xolair,Albuterol and DuoNebs without any relief.Also continues to take her Singulair and zyrtec.she denies any fever or chillls.Also states has not been in contact with sick person with COVID-19   Past Medical History:  Diagnosis Date  . Anemia   . Asthma   . Bronchitis   . COPD (chronic obstructive pulmonary disease) ([place])   . Obesity     Past Surgical History:  Procedure Laterality Date  . DENTAL SURGERY     2 teeth pulled, 11/22/14  . NO PAST SURGERIES      Allergies  Allergen Reactions  . Penicillins     Childhood Allergy Did it involve swelling of the face/tongue/throat, SOB, or low BP? UNK Did it involve sudden or severe rash/hives, skin peeling, or any reaction on the inside of your mouth or nose? UNK Did you need to seek medical attention at a hospital or doctor's office? UNK When did it last happen?more than 10 years If all above answers are "NO", may proceed with cephalosporin use.     Outpatient Encounter Medications as of 08/21/2019  Medication Sig  . ADVAIR HFA 230-21 MCG/ACT inhaler TAKE 2 PUFFS BY MOUTH TWICE A DAY  . albuterol (VENTOLIN HFA) 108 (90 Base) MCG/ACT inhaler Inhale 2 puffs into the lungs every 4 (four) hours as needed for wheezing or shortness of breath.  . cetirizine (ZYRTEC) 10 MG tablet Take 1 tablet (10 mg total) by mouth daily.  Marland Kitchen EPINEPHrine 0.3 mg/0.3 mL IJ SOAJ injection Inject 0.3 mLs (0.3 mg total) into the muscle as needed for anaphylaxis.  . ferrous sulfate 325 (65 FE) MG EC tablet Take 325 mg by mouth daily.  Marland Kitchen guaiFENesin (MUCINEX) 600 MG 12 hr tablet Take 600 mg by mouth as needed.   Marland Kitchen ipratropium-albuterol (DUONEB) 0.5-2.5 (3) MG/3ML SOLN INHALE 1 VIAL BY MOUTH VIA NEBULIZER EVERY 6 HOURS AS NEEDED  .  montelukast (SINGULAIR) 10 MG tablet TAKE 1 TABLET BY MOUTH EVERYDAY AT BEDTIME  . omalizumab Arvid Right) 150 MG/ML prefilled syringe Inject 300 mg into the skin every 14 (fourteen) days.  Marland Kitchen tiotropium (SPIRIVA HANDIHALER) 18 MCG inhalation capsule Place 1 capsule (18 mcg total) into inhaler and inhale daily.   No facility-administered encounter medications on file as of 08/21/2019.     Review of Systems  Constitutional: Negative for chills, fatigue and fever.  HENT: Negative for congestion, rhinorrhea, sinus pressure, sinus pain, sneezing, sore throat and tinnitus.    Eyes: Negative for discharge, redness, itching and visual disturbance.  Respiratory: Positive for shortness of breath and wheezing. Negative for cough and chest tightness.        Asthma   Cardiovascular: Negative for chest pain, palpitations and leg swelling.  Gastrointestinal: Negative for abdominal distention, abdominal pain, constipation, diarrhea, nausea and vomiting.  Musculoskeletal: Negative for gait problem and myalgias.  Neurological: Negative for dizziness, light-headedness and headaches.  Psychiatric/Behavioral: Negative for agitation. The patient is not nervous/anxious.        Wheezing keeping her awake at night     Immunization History  Administered Date(s) Administered  . Td 12/19/2008   Pertinent  Health Maintenance Due  Topic Date Due  . INFLUENZA VACCINE  12/26/2019 (Originally 04/28/2019)  . PAP SMEAR-Modifier  12/28/2019   Fall Risk  08/03/2017 04/19/2017 03/23/2017 07/14/2016 09/12/2015  Falls in the past year? No No No No No   There were no vitals filed for this visit. There is no height or weight on file to calculate BMI. Physical Exam Unable to complete on Telephone visit.   Labs reviewed: Recent Labs    02/16/19 0703 04/04/19 0955 07/11/19 1011  NA 137 137 140  K 4.8 4.2 4.3  CL 104 103 104  CO2 26 27 31   GLUCOSE 114* 122* 92  BUN 12 8 9   CREATININE 0.65 0.74 0.67  CALCIUM 9.1 8.9 8.6   Recent Labs    11/08/18 0434 11/21/18 0621 02/16/19 0703  AST 20 18 12*  ALT 23 25 13   ALKPHOS 88 71 87  BILITOT 0.6 0.3 0.3  PROT 8.5* 7.3 7.8  ALBUMIN 3.7 3.1* 3.7   Recent Labs    02/15/19 2142 02/16/19 0703 04/04/19 0955 07/11/19 1011  WBC 13.4* 10.1 10.2 9.7  NEUTROABS 9.0*  --  8.9* 6,024  HGB 10.5* 10.2* 11.0* 10.8*  HCT 39.4 39.1 37.1 37.0  MCV 72.3* 72.3* 67.7 Repeated and verified X2.* 74.3*  PLT 341 352 358.0 342   Lab Results  Component Value Date   TSH 0.470 11/21/2018   Lab Results  Component Value Date   HGBA1C 5.1 07/11/2019    Lab Results  Component Value Date   CHOL 104 03/23/2017   HDL 63 03/23/2017   LDLCALC 29 03/23/2017   TRIG 58 03/23/2017   CHOLHDL 1.7 03/23/2017    Significant Diagnostic Results in last 30 days:  US Renal  Result Date: 08/02/2019 CLINICAL DATA:  Left flank pain for 1 month. EXAM: RENAL / URINARY TRACT ULTRASOUND COMPLETE COMPARISON:  None. FINDINGS: Right Kidney: Renal measurements: 11.6 X 4.8 X 5.3 cm = volume: 154 mL . Echogenicity within normal limits. No mass or cyst. Slight prominence of the pelvocaliceal system. Left Kidney: Renal measurements: 12.1 x 5.3 x 5.1 cm = volume: 171 mL. Echogenicity within normal limits. No mass or cyst. Slight prominence of the pelvocaliceal system, similar to the opposite side. Bladder: Appears normal for degree of bladder  distention. Other: None. IMPRESSION: Minimal prominence of the pelvicaliceal systems bilaterally. No stones or significant hydronephrosis. Electronically Signed   By: Lorriane Shire M.D.   On: 08/02/2019 14:33    Assessment/Plan Chronic asthma without complication, unspecified asthma severity, unspecified whether persistent Afebrile.Worsening shortness of breath and wheezing.No contact with person with COVID-19.continue on Albuterol,Duoneb,Xolair, Singulair and zyrtec Will Add Tapered Prednisone 10 mg tablet take 40 mg tablet x 1 day then 30 mg tablet then 20 mg tablet,then 10 mg tablet then 5 mg tablet x 1 dose and stop.Encouraged to smoking cessation.  - fluticasone-salmeterol (ADVAIR HFA) 230-21 MCG/ACT inhaler; TAKE 2 PUFFS BY MOUTH TWICE A DAY  Dispense: 36 Inhaler; Refill: 2  Family/ staff Communication: Reviewed plan of care with patient  Labs/tests ordered: None  Spent 11 minutes of non-face to face with patient    Sandrea Hughs, NP

## 2019-08-24 ENCOUNTER — Other Ambulatory Visit: Payer: Self-pay | Admitting: Primary Care

## 2019-08-28 ENCOUNTER — Encounter (HOSPITAL_COMMUNITY): Payer: Self-pay | Admitting: Emergency Medicine

## 2019-08-28 ENCOUNTER — Emergency Department (HOSPITAL_COMMUNITY)
Admission: EM | Admit: 2019-08-28 | Discharge: 2019-08-28 | Disposition: A | Discharge status: Home / Self Care | Payer: 59 | Attending: Emergency Medicine | Admitting: Emergency Medicine

## 2019-08-28 ENCOUNTER — Telehealth: Payer: Self-pay

## 2019-08-28 ENCOUNTER — Ambulatory Visit: Payer: 59

## 2019-08-28 ENCOUNTER — Emergency Department (HOSPITAL_COMMUNITY): Payer: 59

## 2019-08-28 ENCOUNTER — Other Ambulatory Visit: Payer: Self-pay

## 2019-08-28 DIAGNOSIS — J4541 Moderate persistent asthma with (acute) exacerbation: Secondary | ICD-10-CM | POA: Diagnosis not present

## 2019-08-28 DIAGNOSIS — Z79899 Other long term (current) drug therapy: Secondary | ICD-10-CM | POA: Insufficient documentation

## 2019-08-28 DIAGNOSIS — F17210 Nicotine dependence, cigarettes, uncomplicated: Secondary | ICD-10-CM | POA: Diagnosis not present

## 2019-08-28 DIAGNOSIS — R0602 Shortness of breath: Secondary | ICD-10-CM | POA: Diagnosis present

## 2019-08-28 DIAGNOSIS — J449 Chronic obstructive pulmonary disease, unspecified: Secondary | ICD-10-CM | POA: Diagnosis not present

## 2019-08-28 DIAGNOSIS — Z20828 Contact with and (suspected) exposure to other viral communicable diseases: Secondary | ICD-10-CM | POA: Insufficient documentation

## 2019-08-28 LAB — SARS CORONAVIRUS 2 (TAT 6-24 HRS): SARS Coronavirus 2: NEGATIVE

## 2019-08-28 MED ORDER — DEXAMETHASONE SODIUM PHOSPHATE 10 MG/ML IJ SOLN
10.0000 mg | Freq: Once | INTRAMUSCULAR | Status: AC
  Administered 2019-08-28: 10 mg via INTRAMUSCULAR
  Filled 2019-08-28: qty 1

## 2019-08-28 MED ORDER — ALBUTEROL SULFATE HFA 108 (90 BASE) MCG/ACT IN AERS
2.0000 | INHALATION_SPRAY | RESPIRATORY_TRACT | Status: DC | PRN
  Administered 2019-08-28: 2 via RESPIRATORY_TRACT
  Filled 2019-08-28: qty 6.7

## 2019-08-28 MED ORDER — PREDNISONE 10 MG PO TABS: ORAL_TABLET | ORAL | 0 refills | Status: DC

## 2019-08-28 NOTE — ED Provider Notes (Signed)
[facility] DEPT Provider Note   CSN: [id number] Arrival date & time: 08/28/19  0319     History   Chief Complaint Chief Complaint  Patient presents with  . Asthma    HPI April Ellis is a 43 y.o. female.     Patient presents to the emergency department for evaluation of shortness of breath.  Patient has a history of asthma, has been having increased wheezing for 1 day.  She has used her nebulizer treatment with only partial improvement.  Patient reports that she felt short of breath after nebulizer tonight, presented to the ER for repeat evaluation.  She has not had any fever or increased cough.  She has an appointment with her pulmonologist in the morning.     Past Medical History:  Diagnosis Date  . Anemia   . Asthma   . Bronchitis   . COPD (chronic obstructive pulmonary disease) ([place])   . Obesity     Patient Active Problem List   Diagnosis Date Noted  . Obstructive sleep apnea 06/13/2019  . Nocturnal hypoxemia 06/13/2019  . Daytime somnolence 04/04/2019  . Dyspnea 02/15/2019  . Acute respiratory failure ([place]) 11/20/2018  . Allergic reactions 08/16/2016  . Chronic rhinitis 08/16/2016  . Asthma, chronic, unspecified asthma severity, with acute exacerbation 09/30/2014  . Leukocytosis 09/30/2014  . Urticaria   . Iron deficiency anemia 08/14/2014  . History of tobacco abuse 08/14/2014  . Atypical chest pain 06/27/2014  . Tobacco abuse 05/12/2014  . 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 Morbid obesity ([place]) 12/19/2008  . ELEVATED BLOOD PRESSURE WITHOUT DIAGNOSIS OF HYPERTENSION 12/19/2008    Past Surgical History:  Procedure Laterality Date  . DENTAL SURGERY     2 teeth pulled, 11/22/14  . NO PAST SURGERIES       OB History   No obstetric history on file.      Home Medications    Prior to Admission medications   Medication Sig Start Date End Date Taking? Authorizing Provider  albuterol (VENTOLIN HFA) 108  (90 Base) MCG/ACT inhaler Inhale 2 puffs into the lungs every 4 (four) hours as needed for wheezing or shortness of breath. 07/06/19   Deneise Lever, MD  cetirizine (ZYRTEC) 10 MG tablet Take 1 tablet (10 mg total) by mouth daily. 05/08/19   Martyn Ehrich, NP  EPINEPHrine 0.3 mg/0.3 mL IJ SOAJ injection Inject 0.3 mLs (0.3 mg total) into the muscle as needed for anaphylaxis. 07/24/19   Baird Lyons D, MD  ferrous sulfate 325 (65 FE) MG EC tablet Take 1 tablet (325 mg total) by mouth daily. 08/21/19   Ngetich, Dinah C, NP  fluticasone-salmeterol (ADVAIR HFA) 230-21 MCG/ACT inhaler TAKE 2 PUFFS BY MOUTH TWICE A DAY 08/21/19   Ngetich, Dinah C, NP  guaiFENesin (MUCINEX) 600 MG 12 hr tablet Take 600 mg by mouth as needed.     [provider]  ipratropium-albuterol (DUONEB) 0.5-2.5 (3) MG/3ML SOLN INHALE 1 VIAL BY MOUTH VIA NEBULIZER EVERY 6 HOURS AS NEEDED 08/02/19   Ngetich, Dinah C, NP  montelukast (SINGULAIR) 10 MG tablet TAKE 1 TABLET BY MOUTH EVERYDAY AT BEDTIME 05/30/19   Ngetich, Dinah C, NP  omalizumab Arvid Right) 150 MG/ML prefilled syringe Inject 300 mg into the skin every 14 (fourteen) days. 07/24/19   Deneise Lever, MD  predniSONE (DELTASONE) 10 MG tablet Take 40 mg tablet one by mouth x 1 day then  Take 30 mg tablet one by mouth x 1 day  then  Take 20 mg tablet one by mouth x 1 dose then  Take 10 mg tablet one by mouth x 1 dose the   Take 5 mg tablet one by mouth and stop. 08/21/19   Ngetich, Dinah C, NP  tiotropium (SPIRIVA HANDIHALER) 18 MCG inhalation capsule Place 1 capsule (18 mcg total) into inhaler and inhale daily. 07/03/19   Martyn Ehrich, NP    Family History Family History  Problem Relation Age of Onset  . Diabetes Mother   . Cancer Mother   . Diabetes Sister   . Hypertension Sister   . Hypertension Sister   . Urticaria Neg Hx   . Immunodeficiency Neg Hx   . Eczema Neg Hx   . Asthma Neg Hx   . Angioedema Neg Hx   . Allergic rhinitis Neg Hx      Social History Social History   Tobacco Use  . Smoking status: Current Every Day Smoker    Packs/day: 0.25    Years: 15.00    Pack years: 3.75    Types: Cigarettes    Last attempt to quit: 09/16/2014    Years since quitting: 4.9  . Smokeless tobacco: Never Used  . Tobacco comment: smoking 2-3 ciggs per day/taking Chantix 01/12/19; approx. 5 cigs/day as of 06/13/2019  Substance Use Topics  . Alcohol use: No    Alcohol/week: 0.0 standard drinks  . Drug use: No     Allergies   Penicillins   Review of Systems Review of Systems  Respiratory: Positive for shortness of breath and wheezing.   All other systems reviewed and are negative.    Physical Exam Updated Vital Signs BP 121/67   Pulse 85   Temp 98.4 F (36.9 C) (Oral)   Resp 16   Ht 5' (1.524 m)   Wt (!) 158.8 kg   SpO2 95%   BMI 68.35 kg/m   Physical Exam Vitals signs and nursing note reviewed.  Constitutional:      General: She is not in acute distress.    Appearance: Normal appearance. She is well-developed.  HENT:     Head: Normocephalic and atraumatic.     Right Ear: Hearing normal.     Left Ear: Hearing normal.     Nose: Nose normal.  Eyes:     Conjunctiva/sclera: Conjunctivae normal.     Pupils: Pupils are equal, round, and reactive to light.  Neck:     Musculoskeletal: Normal range of motion and neck supple.  Cardiovascular:     Rate and Rhythm: Regular rhythm.     Heart sounds: S1 normal and S2 normal. No murmur. No friction rub. No gallop.   Pulmonary:     Effort: Pulmonary effort is normal. No respiratory distress.     Breath sounds: Decreased breath sounds and wheezing (Very slight at bases) present.  Chest:     Chest wall: No tenderness.  Abdominal:     General: Bowel sounds are normal.     Palpations: Abdomen is soft.     Tenderness: There is no abdominal tenderness. There is no guarding or rebound. Negative signs include Murphy's sign and McBurney's sign.     Hernia: No hernia is  present.  Musculoskeletal: Normal range of motion.  Skin:    General: Skin is warm and dry.     Findings: No rash.  Neurological:     Mental Status: She is alert and oriented to person, place, and time.     GCS: GCS eye subscore  is 4. GCS verbal subscore is 5. GCS motor subscore is 6.     Cranial Nerves: No cranial nerve deficit.     Sensory: No sensory deficit.     Coordination: Coordination normal.  Psychiatric:        Speech: Speech normal.        Behavior: Behavior normal.        Thought Content: Thought content normal.      ED Treatments / Results  Labs (all labs ordered are listed, but only abnormal results are displayed) Labs Reviewed  SARS CORONAVIRUS 2 (TAT 6-24 HRS)    EKG None  Radiology No results found.  Procedures Procedures (including critical care time)  Medications Ordered in ED Medications  dexamethasone (DECADRON) injection 10 mg (has no administration in time range)     Initial Impression / Assessment and Plan / ED Course  I have reviewed the triage vital signs and the nursing notes.  Pertinent labs & imaging results that were available during my care of the patient were reviewed by me and considered in my medical decision making (see chart for details).        Patient appears well.  She is in no distress.  Oxygen saturations are normal.  Air movement is adequate but she does have very slight wheezing at the bases.  Will provide Covid testing.  Decadron IM.  Chest x-ray.  She can follow-up with her pulmonologist this morning for further management.  Final Clinical Impressions(s) / ED Diagnoses   Final diagnoses:  Moderate persistent asthma with exacerbation    ED Discharge Orders    None       Zahrah Sutherlin, Gwenyth Allegra, MD 08/28/19 [phone]

## 2019-08-28 NOTE — ED Triage Notes (Signed)
Pt reports onset of SOB 08/26/2019 Hx Asthma home neb's and inhaler not effective

## 2019-08-28 NOTE — Telephone Encounter (Signed)
Pt came in today for her scheduled xolair injection.  While asking patient the workup questions, she admitted to being seen in ED this morning around 0400.  States that she had a cxr, Covid-19 test, and was administered a steroid injection.    I spoke with CY who advised that pt does not receive injection today.  Requested that this be pushed back 1 week, and that we send in a pred taper rx.  I relayed this info to pt and escribed pred taper as outlined by CY to preferred pharmacy.  Xolair appt has been moved back X1 wk.  Nothing further needed at this time- will close encounter.

## 2019-09-04 ENCOUNTER — Other Ambulatory Visit: Payer: Self-pay

## 2019-09-04 ENCOUNTER — Ambulatory Visit (INDEPENDENT_AMBULATORY_CARE_PROVIDER_SITE_OTHER): Payer: 59

## 2019-09-04 DIAGNOSIS — J4551 Severe persistent asthma with (acute) exacerbation: Secondary | ICD-10-CM | POA: Diagnosis not present

## 2019-09-04 MED ORDER — OMALIZUMAB 150 MG/ML ~~LOC~~ SOSY
300.0000 mg | PREFILLED_SYRINGE | Freq: Once | SUBCUTANEOUS | Status: AC
  Administered 2019-09-04: 10:00:00 300 mg via SUBCUTANEOUS

## 2019-09-04 NOTE — Progress Notes (Signed)
Have you been hospitalized within the last 10 days?  Yes patient in ED 08/28/19- CY aware and recommended 09/04/19 for injection. Do you have a fever?  No Do you have a cough?  No Do you have a headache or sore throat? No Do you have your Epi Pen visible and is it within date?  Yes   Patient came for 2nd Xolair injection.  Xolair injection given 0955. Patient assessed for 15 to 20 minutes for adverse reactions.  Patient denies any signs and symptoms of adverse reactions.  No redness or swelling at injection sites. Patient scheduled next Xolair injection 09/18/19 at [place]. Patient discharged at 1015. Nothing further at this time.

## 2019-09-06 ENCOUNTER — Encounter: Payer: Self-pay | Admitting: Internal Medicine

## 2019-09-06 ENCOUNTER — Other Ambulatory Visit: Payer: Self-pay | Admitting: Family

## 2019-09-06 ENCOUNTER — Ambulatory Visit (INDEPENDENT_AMBULATORY_CARE_PROVIDER_SITE_OTHER): Payer: 59 | Admitting: Internal Medicine

## 2019-09-06 DIAGNOSIS — J449 Chronic obstructive pulmonary disease, unspecified: Secondary | ICD-10-CM | POA: Diagnosis not present

## 2019-09-06 DIAGNOSIS — J4540 Moderate persistent asthma, uncomplicated: Secondary | ICD-10-CM

## 2019-09-06 DIAGNOSIS — Z720 Tobacco use: Secondary | ICD-10-CM

## 2019-09-06 DIAGNOSIS — Z87891 Personal history of nicotine dependence: Secondary | ICD-10-CM

## 2019-09-06 DIAGNOSIS — G4733 Obstructive sleep apnea (adult) (pediatric): Secondary | ICD-10-CM | POA: Diagnosis not present

## 2019-09-06 DIAGNOSIS — Z9989 Dependence on other enabling machines and devices: Secondary | ICD-10-CM

## 2019-09-06 DIAGNOSIS — Z7951 Long term (current) use of inhaled steroids: Secondary | ICD-10-CM

## 2019-09-06 MED ORDER — FUROSEMIDE 20 MG PO TABS: ORAL_TABLET | ORAL | 0 refills | Status: DC

## 2019-09-06 NOTE — Patient Instructions (Addendum)
Your chest xray suggests there may be mild heart failure with fluid overload in your lungs. I have sent a prescription for a diuretic ("fluid pill"). See if taking this for a few days helps your breathing.   I understand you are having paying for your medicines. We will try to find ways to help with this. For now, try using your nebulizer machine twice daily and see if that reduces how often you need to use the albuterol rescue inhaler.  Keep up with your Xolair shot   Please try really hard to use your CPAP machine at least 4 hours per night, at least 6 nights per week. It will help protect your brain and your heart.    Please call if we can help

## 2019-09-06 NOTE — Progress Notes (Signed)
HPI F smoker followed for Asthma, COPD, Chronic Rhinitis, Urticaria, Morbid Obesity, Iron Def Anemia Office spirometry 2017 by [facility]- mild obstruction PFT 05/01/14- Severe obstruction. 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 IgE 234,  EOS low on repeated steroid bursts (04/04/19) -----------------------------------------------------------------------------  06/13/2019- 43  F smoker followed for Asthma, COPD, Chronic Rhinitis, Urticaria, Morbid Obesity, Iron Def Anemia Seen 3x by NP and had ED visit 8/22 for ongoing wheezing dyspnea. HST 04/17/2019- Complex apnea (Central> Obstructive) 9/ hr with desaturation to 81%, mean 87% (Nocturnal Hypoxemia), body weight 355 lbs. Covid neg 8/22 IgE 234,  EOS low on repeated steroid bursts Advair 250, Neb albuterol, neb Duoneb, Spiriva 1.25, Singulair Declnes flu vax- discussed She is unclear about triggers. Some episodes abrupt, others build gradually over several days. She notes weather, season contribute some. May disturb sleep.  Today feels '80%", with mild congestion but no fever, discolored sputum, cough or wheeze. Sleep study reviewed Needs in-center CPAP titration to assess oxygenation response due to additional dx of Nocturnal Hypoxemia. 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 We discussed option of Biologic Immunomodulation such as Xolair. Side effects discussed. She agrees to try this.  Smoking cessation, Steroid side effects, Covid precautions, Weight  discussed.  CXR 05/19/2019- IMPRESSION: Mild cardiomegaly without acute abnormality.  09/06/2019- Virtual Visit via Telephone Note  I connected with April Ellis on 09/06/19 at 10:00 AM EST by telephone and verified that I am speaking with the correct  person using two identifiers.  Location: Patient: H Provider: O   I discussed the limitations, risks, security and privacy concerns of performing an evaluation and management service by telephone and the availability of in person appointments. I also discussed with the patient that there may be a patient responsible charge related to this service. The patient expressed understanding and agreed to proceed.   History of Present Illness: 72  F smoker followed for Asthma/ Xolair, COPD, Chronic Rhinitis, Urticaria,  OSA,  Morbid Obesity, Iron Def Anemia CPAP titration study > 10 cwp ( no centrals noted)     Baseline AHI 9/ hr CPAP auto 5-15/ [facility] ( 07/12/2019) Download compliance 7%, AHI 0.2/ hr ED 08/28/2019- Asthma exacerbation Latest Xolair 12/8 (was her second dose) Albuterol hfa, Advair hfa 230, neb Duoneb, Singulair, Pred taper started 12/1, Spiriva Frequent use of rescue inhaler. Out of Advair and Spiriva due to cost.  Question possible CHF component with fluid retention- to try lasix for effect.   Observations/Objective: CXR 08/28/2019 IMPRESSION: 1. Similar appearance of cardiomegaly and mild pulmonary vascular congestion. 2. No focal airspace disease.  Assessment and Plan: Asthma moderate persistent- cost impacting acces to meds. Waiting for xolair impact. OSA- Emphasize compiance goals ? Fluid overload based on CXR report-  therapeutic trial lasix  Follow Up Instructions: 4 months   I discussed the assessment and treatment plan with the patient. The patient was provided an opportunity to ask questions and all were answered. The patient agreed with the plan and demonstrated an understanding of the instructions.   The patient was advised to call back or seek an in-person evaluation if the symptoms worsen or if the condition fails to improve as anticipated.  I provided 18 minutes of non-face-to-face time during this encounter.   Baird Lyons, MD  ROS-see HPI    + = positive Constitutional:    weight loss, night sweats, fevers, chills, fatigue, lassitude. HEENT:    headaches, difficulty swallowing, tooth/dental problems, sore throat,       sneezing, itching, ear ache, nasal congestion, post nasal drip, snoring CV:    chest pain, orthopnea, PND, swelling in lower extremities, anasarca,                                  dizziness, palpitations Resp:  + shortness of breath with exertion or at rest.                productive cough,   +non-productive cough, coughing up of blood.              change in color of mucus.  +wheezing.   Skin:    rash or lesions. GI:  No-   heartburn, indigestion, abdominal pain, nausea, vomiting, diarrhea,                 change in bowel habits, loss of appetite GU: dysuria, change in color of urine, no urgency or frequency.   flank pain. MS:   joint pain, stiffness, decreased range of motion, back pain. Neuro-     nothing unusual Psych:  change in mood or affect.  depression or anxiety.   memory loss.  OBJ- Physical Exam General- Alert, Oriented, Affect-appropriate, Distress- none acute, +morbidly obese 367 lbs Skin- rash-none, lesions- none, excoriation- none Lymphadenopathy- none Head- atraumatic            Eyes- Gross vision intact, PERRLA, conjunctivae and secretions clear            Ears- Hearing, canals-normal            Nose- Clear, no-Septal dev, mucus, polyps, erosion, perforation             Throat- Mallampati III, mucosa clear , drainage- none, tonsils- atrophic Neck- flexible , trachea midline, no stridor , thyroid nl, carotid no bruit Chest - symmetrical excursion , unlabored           Heart/CV- RRR , no murmur , no gallop  , no rub, nl s1 s2                           - JVD- none , edema- none, stasis changes- none, varices- none           Lung- unlabored,  wheeze- none, cough- none , dullness-none, rub- none           Chest wall-  Abd-  Br/ Gen/ Rectal- Not done, not indicated Extrem- cyanosis- none,  clubbing, none, atrophy- none, strength- nl Neuro- grossly intact to observation

## 2019-09-10 ENCOUNTER — Telehealth: Payer: Self-pay | Admitting: Internal Medicine

## 2019-09-10 NOTE — Telephone Encounter (Signed)
Xolair Prefilled Syringe Order: 150mg  Prefilled Syringe:  #4 75mg  Prefilled Syringe: N/A Ordered Date: 09/10/2019 Expected date of arrival: 09/12/2019 Ordered by: Desmond Dike, [place]  Specialty Pharmacy: [facility]

## 2019-09-12 NOTE — Telephone Encounter (Signed)
Xolair Prefilled Syringe Received:  150mg  Prefilled Syringe >> quantity #4, lot # [id number], exp date 03/2020 75mg  Prefilled Syringe >> N/A Medication arrival date: 09/12/2019 Received by: Desmond Dike, [place]

## 2019-09-18 ENCOUNTER — Ambulatory Visit: Payer: 59

## 2019-09-19 ENCOUNTER — Ambulatory Visit (INDEPENDENT_AMBULATORY_CARE_PROVIDER_SITE_OTHER): Payer: 59

## 2019-09-19 ENCOUNTER — Other Ambulatory Visit: Payer: Self-pay

## 2019-09-19 DIAGNOSIS — J4551 Severe persistent asthma with (acute) exacerbation: Secondary | ICD-10-CM | POA: Diagnosis not present

## 2019-09-19 MED ORDER — OMALIZUMAB 150 MG/ML ~~LOC~~ SOSY
300.0000 mg | PREFILLED_SYRINGE | Freq: Once | SUBCUTANEOUS | Status: AC
  Administered 2019-09-19: 300 mg via SUBCUTANEOUS

## 2019-09-19 NOTE — Progress Notes (Signed)
All questions were answered by the patient before medication was administered. Have you been hospitalized in the last 10 days? No Do you have a fever? No Do you have a cough? No Do you have a headache or sore throat? No  

## 2019-10-01 ENCOUNTER — Ambulatory Visit: Payer: 59 | Admitting: Family

## 2019-10-03 ENCOUNTER — Ambulatory Visit (INDEPENDENT_AMBULATORY_CARE_PROVIDER_SITE_OTHER): Payer: BC Managed Care – PPO

## 2019-10-03 ENCOUNTER — Other Ambulatory Visit: Payer: Self-pay

## 2019-10-03 DIAGNOSIS — J4551 Severe persistent asthma with (acute) exacerbation: Secondary | ICD-10-CM

## 2019-10-03 MED ORDER — OMALIZUMAB 150 MG/ML ~~LOC~~ SOSY
300.0000 mg | PREFILLED_SYRINGE | Freq: Once | SUBCUTANEOUS | Status: AC
  Administered 2019-10-03: 300 mg via SUBCUTANEOUS

## 2019-10-03 NOTE — Progress Notes (Signed)
All questions were answered by the patient before medication was administered. Have you been hospitalized in the last 10 days? No Do you have a fever? No Do you have a cough? No Do you have a headache or sore throat? No  

## 2019-10-04 ENCOUNTER — Other Ambulatory Visit: Payer: 59

## 2019-10-09 ENCOUNTER — Telehealth: Payer: Self-pay | Admitting: Internal Medicine

## 2019-10-09 DIAGNOSIS — R0902 Hypoxemia: Secondary | ICD-10-CM | POA: Diagnosis not present

## 2019-10-09 DIAGNOSIS — R000 Tachycardia, unspecified: Secondary | ICD-10-CM | POA: Diagnosis not present

## 2019-10-09 DIAGNOSIS — R0602 Shortness of breath: Secondary | ICD-10-CM | POA: Diagnosis not present

## 2019-10-09 DIAGNOSIS — L299 Pruritus, unspecified: Secondary | ICD-10-CM | POA: Diagnosis not present

## 2019-10-09 NOTE — Telephone Encounter (Signed)
Xolair Prefilled Syringe Order: 150mg  Prefilled Syringe:  # 75mg  Prefilled Syringe: #N/A Ordered Date: 10/09/19 Expected date of arrival: 10/11/19 Ordered by: [place]: [facility]

## 2019-10-10 ENCOUNTER — Ambulatory Visit: Payer: 59 | Admitting: Family

## 2019-10-11 ENCOUNTER — Telehealth: Payer: Self-pay | Admitting: Internal Medicine

## 2019-10-11 MED ORDER — PREDNISONE 20 MG PO TABS: 20.0000 mg | ORAL_TABLET | Freq: Every day | ORAL | 0 refills | Status: DC

## 2019-10-11 NOTE — Telephone Encounter (Signed)
Called and spoke with pt who stated pt began wheezing overnight 1/13 and stated due to this, pt has had to use her rescue inhaler at least three times since last night and stated she has had to do at least 3-4 neb treatments since last night as well.  Pt still taking all meds as prescribed. Pt denies any complaints of any fever. Pt took her temp while on the phone and her temp was 98.4.  Pt denies any complaints of cough and stated her breathing is okay but she does have some tightness in chest along with the wheezing.  Pt wants recommendations to help with symptoms. Dr. Annamaria Boots, please advise on this for pt.   Allergies  Allergen Reactions  . Penicillins     Childhood Allergy Did it involve swelling of the face/tongue/throat, SOB, or low BP? UNK Did it involve sudden or severe rash/hives, skin peeling, or any reaction on the inside of your mouth or nose? UNK Did you need to seek medical attention at a hospital or doctor's office? UNK When did it last happen?more than 10 years If all above answers are "NO", may proceed with cephalosporin use.      Current Outpatient Medications:  .  albuterol (VENTOLIN HFA) 108 (90 Base) MCG/ACT inhaler, Inhale 2 puffs into the lungs every 4 (four) hours as needed for wheezing or shortness of breath., Disp: 8 g, Rfl: 5 .  cetirizine (ZYRTEC) 10 MG tablet, Take 1 tablet (10 mg total) by mouth daily., Disp: 30 tablet, Rfl: 6 .  EPINEPHrine 0.3 mg/0.3 mL IJ SOAJ injection, Inject 0.3 mLs (0.3 mg total) into the muscle as needed for anaphylaxis., Disp: 2 each, Rfl: 5 .  ferrous sulfate 325 (65 FE) MG EC tablet, Take 1 tablet (325 mg total) by mouth daily., Disp: 90 tablet, Rfl: 1 .  fluticasone-salmeterol (ADVAIR HFA) 230-21 MCG/ACT inhaler, TAKE 2 PUFFS BY MOUTH TWICE A DAY, Disp: 36 Inhaler, Rfl: 2 .  furosemide (LASIX) 20 MG tablet, 1 daily x 4 days for fluid, Disp: 4 tablet, Rfl: 0 .  guaiFENesin (MUCINEX) 600 MG 12 hr tablet, Take 600 mg by mouth as  needed. , Disp: , Rfl:  .  ipratropium-albuterol (DUONEB) 0.5-2.5 (3) MG/3ML SOLN, INHALE 1 VIAL BY MOUTH VIA NEBULIZER EVERY 6 HOURS AS NEEDED, Disp: 360 mL, Rfl: 3 .  montelukast (SINGULAIR) 10 MG tablet, TAKE 1 TABLET BY MOUTH EVERYDAY AT BEDTIME, Disp: 90 tablet, Rfl: 1 .  omalizumab (XOLAIR) 150 MG/ML prefilled syringe, Inject 300 mg into the skin every 14 (fourteen) days., Disp: 4 mL, Rfl: 12 .  tiotropium (SPIRIVA HANDIHALER) 18 MCG inhalation capsule, Place 1 capsule (18 mcg total) into inhaler and inhale daily., Disp: 30 capsule, Rfl: 6

## 2019-10-11 NOTE — Telephone Encounter (Signed)
Rx was called to pharm and I spoke with the pt and notified that this was done  Nothing further needed

## 2019-10-11 NOTE — Telephone Encounter (Signed)
Wee are hoping Xolair will begin to help.  For now suggest prednisone 20 mg, # 5, 1 daily x 5 days. Continue routine meds.

## 2019-10-11 NOTE — Telephone Encounter (Signed)
Xolair Prefilled Syringe Received:  150mg  Prefilled Syringe >> quantity #4, lot # [id number], exp date 02/26/2020 75mg  Prefilled Syringe >> quantity #N/A Medication arrival date: 10/11/19 Received by: Treyvone Chelf,LPN

## 2019-10-17 ENCOUNTER — Ambulatory Visit (INDEPENDENT_AMBULATORY_CARE_PROVIDER_SITE_OTHER): Payer: BC Managed Care – PPO

## 2019-10-17 ENCOUNTER — Other Ambulatory Visit: Payer: Self-pay

## 2019-10-17 DIAGNOSIS — J4551 Severe persistent asthma with (acute) exacerbation: Secondary | ICD-10-CM

## 2019-10-17 MED ORDER — OMALIZUMAB 150 MG/ML ~~LOC~~ SOSY
300.0000 mg | PREFILLED_SYRINGE | Freq: Once | SUBCUTANEOUS | Status: AC
  Administered 2019-10-17: 17:00:00 300 mg via SUBCUTANEOUS

## 2019-10-17 NOTE — Progress Notes (Signed)
Have you been hospitalized within the last 10 days?  No Do you have a fever?  No Do you have a cough?  No Do you have a headache or sore throat? No Do you have your Epi Pen visible and is it within date?  Yes 

## 2019-10-19 ENCOUNTER — Emergency Department (HOSPITAL_COMMUNITY)
Admission: EM | Admit: 2019-10-19 | Discharge: 2019-10-20 | Disposition: A | Discharge status: Home / Self Care | Payer: BC Managed Care – PPO | Attending: Emergency Medicine | Admitting: Emergency Medicine

## 2019-10-19 ENCOUNTER — Emergency Department (HOSPITAL_COMMUNITY): Payer: BC Managed Care – PPO

## 2019-10-19 ENCOUNTER — Ambulatory Visit (HOSPITAL_COMMUNITY)
Admission: EM | Admit: 2019-10-19 | Discharge: 2019-10-19 | Disposition: A | Discharge status: Other Acute Inpatient Hospital | Payer: BC Managed Care – PPO | Source: Home / Self Care

## 2019-10-19 ENCOUNTER — Other Ambulatory Visit: Payer: Self-pay

## 2019-10-19 ENCOUNTER — Encounter (HOSPITAL_COMMUNITY): Payer: Self-pay | Admitting: Emergency Medicine

## 2019-10-19 DIAGNOSIS — F17210 Nicotine dependence, cigarettes, uncomplicated: Secondary | ICD-10-CM | POA: Diagnosis not present

## 2019-10-19 DIAGNOSIS — Z20822 Contact with and (suspected) exposure to covid-19: Secondary | ICD-10-CM | POA: Diagnosis not present

## 2019-10-19 DIAGNOSIS — I10 Essential (primary) hypertension: Secondary | ICD-10-CM | POA: Insufficient documentation

## 2019-10-19 DIAGNOSIS — R000 Tachycardia, unspecified: Secondary | ICD-10-CM | POA: Diagnosis not present

## 2019-10-19 DIAGNOSIS — Z79899 Other long term (current) drug therapy: Secondary | ICD-10-CM | POA: Diagnosis not present

## 2019-10-19 DIAGNOSIS — R0602 Shortness of breath: Secondary | ICD-10-CM | POA: Diagnosis not present

## 2019-10-19 DIAGNOSIS — J45901 Unspecified asthma with (acute) exacerbation: Secondary | ICD-10-CM | POA: Diagnosis not present

## 2019-10-19 LAB — CBC
HCT: 39.9 % (ref 36.0–46.0)
Hemoglobin: 11.4 g/dL — ABNORMAL LOW (ref 12.0–15.0)
MCH: 24.8 pg — ABNORMAL LOW (ref 26.0–34.0)
MCHC: 28.6 g/dL — ABNORMAL LOW (ref 30.0–36.0)
MCV: 86.7 fL (ref 80.0–100.0)
Platelets: 334 10*3/uL (ref 150–400)
RBC: 4.6 MIL/uL (ref 3.87–5.11)
RDW: 19.1 % — ABNORMAL HIGH (ref 11.5–15.5)
WBC: 11.5 10*3/uL — ABNORMAL HIGH (ref 4.0–10.5)
nRBC: 0 % (ref 0.0–0.2)

## 2019-10-19 LAB — COMPREHENSIVE METABOLIC PANEL
ALT: 16 U/L (ref 0–44)
AST: 33 U/L (ref 15–41)
Albumin: 3.8 g/dL (ref 3.5–5.0)
Alkaline Phosphatase: 72 U/L (ref 38–126)
Anion gap: 9 (ref 5–15)
BUN: 7 mg/dL (ref 6–20)
CO2: 26 mmol/L (ref 22–32)
Calcium: 8.6 mg/dL — ABNORMAL LOW (ref 8.9–10.3)
Chloride: 102 mmol/L (ref 98–111)
Creatinine, Ser: 0.84 mg/dL (ref 0.44–1.00)
GFR calc Af Amer: >60 mL/min (ref >60)
GFR calc non Af Amer: >60 mL/min (ref >60)
Glucose, Bld: 80 mg/dL (ref 70–99)
Potassium: 4.8 mmol/L (ref 3.5–5.1)
Sodium: 137 mmol/L (ref 135–145)
Total Bilirubin: 0.9 mg/dL (ref 0.3–1.2)
Total Protein: 7.3 g/dL (ref 6.5–8.1)

## 2019-10-19 LAB — I-STAT BETA HCG BLOOD, ED (MC, WL, AP ONLY): I-stat hCG, quantitative: <5 m[IU]/mL (ref <5)

## 2019-10-19 LAB — TROPONIN I (HIGH SENSITIVITY)
Troponin I (High Sensitivity): 3 ng/L (ref <18)
Troponin I (High Sensitivity): 2 ng/L (ref <18)

## 2019-10-19 LAB — POC SARS CORONAVIRUS 2 AG -  ED: SARS Coronavirus 2 Ag: NEGATIVE

## 2019-10-19 MED ORDER — MAGNESIUM SULFATE 2 GM/50ML IV SOLN
2.0000 g | Freq: Once | INTRAVENOUS | Status: AC
  Administered 2019-10-19: 2 g via INTRAVENOUS
  Filled 2019-10-19: qty 50

## 2019-10-19 MED ORDER — ALBUTEROL SULFATE HFA 108 (90 BASE) MCG/ACT IN AERS
8.0000 | INHALATION_SPRAY | Freq: Once | RESPIRATORY_TRACT | Status: AC
  Administered 2019-10-19: 8 via RESPIRATORY_TRACT
  Filled 2019-10-19: qty 6.7

## 2019-10-19 MED ORDER — ALBUTEROL SULFATE HFA 108 (90 BASE) MCG/ACT IN AERS
6.0000 | INHALATION_SPRAY | Freq: Once | RESPIRATORY_TRACT | Status: AC
  Administered 2019-10-19: 6 via RESPIRATORY_TRACT

## 2019-10-19 MED ORDER — IPRATROPIUM-ALBUTEROL 0.5-2.5 (3) MG/3ML IN SOLN
3.0000 mL | Freq: Once | RESPIRATORY_TRACT | Status: AC
  Administered 2019-10-19: 3 mL via RESPIRATORY_TRACT
  Filled 2019-10-19: qty 3

## 2019-10-19 MED ORDER — PREDNISONE 10 MG PO TABS: 50.0000 mg | ORAL_TABLET | Freq: Every day | ORAL | 0 refills | Status: AC

## 2019-10-19 MED ORDER — LACTATED RINGERS IV BOLUS
1000.0000 mL | Freq: Once | INTRAVENOUS | Status: AC
  Administered 2019-10-19: 1000 mL via INTRAVENOUS

## 2019-10-19 MED ORDER — IPRATROPIUM BROMIDE HFA 17 MCG/ACT IN AERS
2.0000 | INHALATION_SPRAY | Freq: Once | RESPIRATORY_TRACT | Status: AC
  Administered 2019-10-19: 2 via RESPIRATORY_TRACT
  Filled 2019-10-19: qty 12.9

## 2019-10-19 MED ORDER — METHYLPREDNISOLONE SODIUM SUCC 125 MG IJ SOLR
125.0000 mg | Freq: Once | INTRAMUSCULAR | Status: AC
  Administered 2019-10-19: 125 mg via INTRAVENOUS
  Filled 2019-10-19: qty 2

## 2019-10-19 NOTE — ED Triage Notes (Signed)
Pt here from [facility] with c/o sob , pt was place on O2 at [place] and seems to be fine here , pt stills smokes daily with asthma and just finished prednisone 1 week ago

## 2019-10-19 NOTE — ED Provider Notes (Signed)
[facility] DEPART[facility] Provider Note   CSN: [id number] Arrival date & time: 10/19/19  1752     History No chief complaint on file.   April Ellis is a 44 y.o. female.  HPI 44 yo F with history of severe COPD, asthma, obesity, presenting to the ED for wheezing and SOB for the last 5 days. Recently finished a course of steroids per her pulmonologist. States she has had increasing SOB and wheezing over the course of today, went to urgent care today and noted to be hypoxic on room air, tachycardic and tachypneic. Sent to ED for further evaluation. No fevers. No cough. No ab pain, n/v/d. No headaches or neurological symptoms. No productive cough or chest pain. No leg swelling or redness.     Past Medical History:  Diagnosis Date  . Anemia   . Asthma   . Bronchitis   . COPD (chronic obstructive pulmonary disease) ([place])   . Obesity     Patient Active Problem List   Diagnosis Date Noted  . Obstructive sleep apnea 06/13/2019  . Nocturnal hypoxemia 06/13/2019  . Daytime somnolence 04/04/2019  . Dyspnea 02/15/2019  . Acute respiratory failure ([place]) 11/20/2018  . Allergic reactions 08/16/2016  . Chronic rhinitis 08/16/2016  . Asthma, chronic, unspecified asthma severity, with acute exacerbation 09/30/2014  . Leukocytosis 09/30/2014  . Urticaria   . Iron deficiency anemia 08/14/2014  . History of tobacco abuse 08/14/2014  . Atypical chest pain 06/27/2014  . Tobacco abuse 05/12/2014  . Asthma-COPD overlap syndrome ([place]) 05/01/2014  . Morbid obesity ([place]) 12/19/2008  . ELEVATED BLOOD PRESSURE WITHOUT DIAGNOSIS OF HYPERTENSION 12/19/2008    Past Surgical History:  Procedure Laterality Date  . DENTAL SURGERY     2 teeth pulled, 11/22/14  . NO PAST SURGERIES       OB History   No obstetric history on file.     Family History  Problem Relation Age of Onset  . Diabetes Mother   . Cancer Mother   . Diabetes Sister   . Hypertension Sister   .  Hypertension Sister   . Urticaria Neg Hx   . Immunodeficiency Neg Hx   . Eczema Neg Hx   . Asthma Neg Hx   . Angioedema Neg Hx   . Allergic rhinitis Neg Hx     Social History   Tobacco Use  . Smoking status: Current Every Day Smoker    Packs/day: 0.25    Years: 15.00    Pack years: 3.75    Types: Cigarettes    Last attempt to quit: 09/16/2014    Years since quitting: 5.0  . Smokeless tobacco: Never Used  . Tobacco comment: smoking 2-3 ciggs per day/taking Chantix 01/12/19; approx. 5 cigs/day as of 06/13/2019  Substance Use Topics  . Alcohol use: No    Alcohol/week: 0.0 standard drinks  . Drug use: No    Home Medications Prior to Admission medications   Medication Sig Start Date End Date Taking? Authorizing Provider  albuterol (VENTOLIN HFA) 108 (90 Base) MCG/ACT inhaler Inhale 2 puffs into the lungs every 4 (four) hours as needed for wheezing or shortness of breath. 07/06/19   Deneise Lever, MD  cetirizine (ZYRTEC) 10 MG tablet Take 1 tablet (10 mg total) by mouth daily. 05/08/19   Martyn Ehrich, NP  EPINEPHrine 0.3 mg/0.3 mL IJ SOAJ injection Inject 0.3 mLs (0.3 mg total) into the muscle as needed for anaphylaxis. 07/24/19   Deneise Lever, MD  ferrous sulfate 325 (65 FE) MG EC tablet Take 1 tablet (325 mg total) by mouth daily. 08/21/19   Ngetich, Dinah C, NP  fluticasone-salmeterol (ADVAIR HFA) 230-21 MCG/ACT inhaler TAKE 2 PUFFS BY MOUTH TWICE A DAY 08/21/19   Ngetich, Dinah C, NP  furosemide (LASIX) 20 MG tablet 1 daily x 4 days for fluid 09/06/19   Young, Clinton D, MD  guaiFENesin (MUCINEX) 600 MG 12 hr tablet Take 600 mg by mouth as needed.     [provider]  ipratropium-albuterol (DUONEB) 0.5-2.5 (3) MG/3ML SOLN INHALE 1 VIAL BY MOUTH VIA NEBULIZER EVERY 6 HOURS AS NEEDED 08/02/19   Ngetich, Dinah C, NP  montelukast (SINGULAIR) 10 MG tablet TAKE 1 TABLET BY MOUTH EVERYDAY AT BEDTIME 09/07/19   Ngetich, Dinah C, NP  omalizumab Arvid Right) 150 MG/ML  prefilled syringe Inject 300 mg into the skin every 14 (fourteen) days. 07/24/19   Baird Lyons D, MD  predniSONE (DELTASONE) 10 MG tablet Take 5 tablets (50 mg total) by mouth daily for 5 days. 10/19/19 10/24/19  Kizzie Fantasia, MD  tiotropium (SPIRIVA HANDIHALER) 18 MCG inhalation capsule Place 1 capsule (18 mcg total) into inhaler and inhale daily. 07/03/19   Martyn Ehrich, NP    Allergies    Penicillins  Review of Systems   Review of Systems  Constitutional: Negative for chills and fever.  HENT: Negative for ear pain and sore throat.   Eyes: Negative for pain and visual disturbance.  Respiratory: Positive for cough, shortness of breath and wheezing.   Cardiovascular: Negative for chest pain and palpitations.  Gastrointestinal: Negative for abdominal pain and vomiting.  Genitourinary: Negative for dysuria and hematuria.  Musculoskeletal: Negative for arthralgias and back pain.  Skin: Negative for color change and rash.  Neurological: Negative for seizures and syncope.  All other systems reviewed and are negative.   Physical Exam Updated Vital Signs BP 112/81   Pulse 94   Temp 98.6 F (37 C) (Oral)   Resp 15   Ht 5' (1.524 m)   Wt (!) 158.8 kg   SpO2 99%   BMI 68.37 kg/m   Physical Exam Constitutional:      Appearance: She is obese. She is ill-appearing.  HENT:     Right Ear: External ear normal.     Left Ear: External ear normal.     Nose: Nose normal. No congestion.     Mouth/Throat:     Mouth: Mucous membranes are moist.     Pharynx: Oropharynx is clear.  Cardiovascular:     Rate and Rhythm: Tachycardia present.  Pulmonary:     Breath sounds: Wheezing present.     Comments: Mildly increased wob Wheezing throughout Abdominal:     General: There is no distension.     Tenderness: There is no abdominal tenderness. There is no guarding or rebound.  Musculoskeletal:        General: No swelling, tenderness, deformity or signs of injury.     Cervical back:  Normal range of motion. No rigidity.  Skin:    General: Skin is warm.     Capillary Refill: Capillary refill takes less than 2 seconds.  Neurological:     General: No focal deficit present.     Mental Status: She is alert.  Psychiatric:        Mood and Affect: Mood normal.        Behavior: Behavior normal.     ED Results / Procedures / Treatments   Labs (all  labs ordered are listed, but only abnormal results are displayed) Labs Reviewed  CBC - Abnormal; Notable for the following components:      Result Value   WBC 11.5 (*)    Hemoglobin 11.4 (*)    MCH 24.8 (*)    MCHC 28.6 (*)    RDW 19.1 (*)    All other components within normal limits  COMPREHENSIVE METABOLIC PANEL - Abnormal; Notable for the following components:   Calcium 8.6 (*)    All other components within normal limits  POC SARS CORONAVIRUS 2 AG -  ED  I-STAT BETA HCG BLOOD, ED ([facility], [facility], [facility] ONLY)  TROPONIN I (HIGH SENSITIVITY)  TROPONIN I (HIGH SENSITIVITY)    EKG EKG Interpretation  Date/Time:  Friday October 19 2019 17:58:27 EST Ventricular Rate:  118 PR Interval:  128 QRS Duration: 82 QT Interval:  314 QTC Calculation: 440 R Axis:   45 Text Interpretation: Sinus tachycardia Confirmed by Lajean Saver [phone]) on 10/19/2019 7:56:37 PM   Radiology DG Chest 2 View  Result Date: 10/19/2019 CLINICAL DATA:  Shortness of breath. EXAM: CHEST - 2 VIEW COMPARISON:  August 28, 2019 FINDINGS: The heart size and mediastinal contours are within normal limits. Both lungs are clear. The visualized skeletal structures are unremarkable. IMPRESSION: No active cardiopulmonary disease. Electronically Signed   By: Virgina Norfolk M.D.   On: 10/19/2019 19:02    Procedures Procedures (including critical care time)  Medications Ordered in ED Medications  methylPREDNISolone sodium succinate (SOLU-MEDROL) 125 mg/2 mL injection 125 mg (125 mg Intravenous Given 10/19/19 2056)  magnesium sulfate IVPB 2 g 50 mL (0 g  Intravenous Stopped 10/19/19 2213)  lactated ringers bolus 1,000 mL (1,000 mLs Intravenous New Bag/Given 10/19/19 2103)  albuterol (VENTOLIN HFA) 108 (90 Base) MCG/ACT inhaler 8 puff (8 puffs Inhalation Given 10/19/19 2056)  albuterol (VENTOLIN HFA) 108 (90 Base) MCG/ACT inhaler 6 puff (6 puffs Inhalation Given 10/19/19 2056)  ipratropium (ATROVENT HFA) inhaler 2 puff (2 puffs Inhalation Given 10/19/19 2217)  ipratropium-albuterol (DUONEB) 0.5-2.5 (3) MG/3ML nebulizer solution 3 mL (3 mLs Nebulization Given 10/19/19 2218)  ipratropium-albuterol (DUONEB) 0.5-2.5 (3) MG/3ML nebulizer solution 3 mL (3 mLs Nebulization Given 10/19/19 2249)    ED Course  I have reviewed the triage vital signs and the nursing notes.  Pertinent labs & imaging results that were available during my care of the patient were reviewed by me and considered in my medical decision making (see chart for details).    MDM Rules/Calculators/A&P                      44 year old female presenting to the ED for asthma exacerbation.  On arrival patient was hemodynamically stable, wheezing noted throughout the lung fields, increased expiration, consistent with likely asthma exacerbation.  Patient placed on 2 L nasal cannula, satting 100%, decreased to 2 L on arrival.  We will continue to wean as possible.  Patient given steroids as well as breathing treatment in the ED.  Chest x-ray with no focal consolidations, no concern for pneumonia on my review.  No concern for volume overload as well, EKG was sinus tachycardia but no signs of acute ischemic changes.  Labs obtained as well as Covid swab.   Covid negative. CXR clear. Labs reassuring. After first duonebs patient improving, off supp oxy. Appears less tachypneic and less wheezing noted. Will give 2nd duoneb. Patient states she can get her refill of her duoneb tomorrow morning. Patient has inhalers as well  as duonebs at home, very reliable patient and very knowledgeable about her disease  process. Patient will need to ambulate and maintain sats, pending. On chart review, patient's vitals were recorded on initial presentation, on my review patient is on room air, satting in the low 90's. Less tachypneic, less dysnpeic.   On reassessment, patient states she feels much better after the 2 DuoNeb's, less tachypnea, less dyspneic on my exam, on room air satting well, had a risk benefits discussion with patient regarding admission versus discharge, she states that she would rather refill her DuoNeb's at home and do her home treatments as indicated rather than coming to the hospital.  I think this is reasonable given the patient is not hypoxic, less tachypneic and overall appears better than when she got here.  Patient be discharged home with prednisone burst, return precautions were given to the patient as well, she agreed to plan, discharged home in good condition.  The attending physician was present and available for all medical decision making and procedures related to this patient's care.          Final Clinical Impression(s) / ED Diagnoses Final diagnoses:  Moderate asthma with exacerbation, unspecified whether persistent    Rx / DC Orders ED Discharge Orders         Ordered    predniSONE (DELTASONE) 10 MG tablet  Daily     10/19/19 2329           Kizzie Fantasia, MD 10/19/19 [id number]    Lajean Saver, MD 10/20/19 [phone]

## 2019-10-19 NOTE — ED Triage Notes (Signed)
Pt here for asthma sx; pt sats noted to be 71% on RA and pt was tripoding for comfort; pt with shallow rapid resp; pt placed on O2 and taken to ED

## 2019-10-19 NOTE — ED Notes (Signed)
Pt was assisted to bedside commode, o2 sats dropped to 85%. MD aware.

## 2019-10-19 NOTE — ED Notes (Signed)
Patient is being discharged from the Urgent [facility] and sent to the Emergency Department via wheelchair by staff. Per  SN, patient is stable but in need of higher level of care due to SOB and decreased O2 sats. Patient is aware and verbalizes understanding of plan of care.  Vitals:   10/19/19 1748  Pulse: (!) 126  Resp: (!) 28  SpO2: (!) 71%

## 2019-10-24 ENCOUNTER — Encounter: Payer: Self-pay | Admitting: Nurse Practitioner

## 2019-10-24 ENCOUNTER — Other Ambulatory Visit: Payer: Self-pay

## 2019-10-24 ENCOUNTER — Ambulatory Visit (INDEPENDENT_AMBULATORY_CARE_PROVIDER_SITE_OTHER): Payer: BC Managed Care – PPO | Admitting: Nurse Practitioner

## 2019-10-24 VITALS — BP 120/64 | HR 100 | Temp 98.3°F | Ht 61.8 in | Wt 360.0 lb

## 2019-10-24 DIAGNOSIS — J449 Chronic obstructive pulmonary disease, unspecified: Secondary | ICD-10-CM

## 2019-10-24 DIAGNOSIS — Z6841 Body Mass Index (BMI) 40.0 and over, adult: Secondary | ICD-10-CM | POA: Diagnosis not present

## 2019-10-24 DIAGNOSIS — I471 Supraventricular tachycardia: Secondary | ICD-10-CM | POA: Insufficient documentation

## 2019-10-24 DIAGNOSIS — E66813 Obesity, class 3: Secondary | ICD-10-CM

## 2019-10-24 MED ORDER — PHENTERMINE HCL 15 MG PO CAPS: 15.0000 mg | ORAL_CAPSULE | ORAL | 0 refills | Status: DC

## 2019-10-24 NOTE — Patient Instructions (Addendum)

## 2019-10-24 NOTE — Progress Notes (Signed)
Established Patient Office Visit  Subjective:  Patient ID: April Ellis, female    DOB: January 19, 1976  Age: 44 y.o. MRN: [id number]  CC:  Chief Complaint  Patient presents with  . Establish Care  . Weight Loss    patient has tried taking phentermine in the past and she stated it did work for her     HPI April Ellis presents for   Past Medical History:  Diagnosis Date  . Anemia   . Asthma   . Bronchitis   . COPD (chronic obstructive pulmonary disease) ([place])   . Obesity     Past Surgical History:  Procedure Laterality Date  . DENTAL SURGERY     2 teeth pulled, 11/22/14  . NO PAST SURGERIES      Family History  Problem Relation Age of Onset  . Diabetes Mother   . Cancer Mother   . Diabetes Sister   . Hypertension Sister   . Hypertension Sister   . Urticaria Neg Hx   . Immunodeficiency Neg Hx   . Eczema Neg Hx   . Asthma Neg Hx   . Angioedema Neg Hx   . Allergic rhinitis Neg Hx     Social History   Socioeconomic History  . Marital status: Single    Spouse name: Not on file  . Number of children: Not on file  . Years of education: Not on file  . Highest education level: Not on file  Occupational History  . Occupation: customer service rep  Tobacco Use  . Smoking status: Current Every Day Smoker    Packs/day: 0.25    Years: 15.00    Pack years: 3.75    Types: Cigarettes    Last attempt to quit: 09/16/2014    Years since quitting: 5.1  . Smokeless tobacco: Never Used  . Tobacco comment: smoking 2-3 ciggs per day/taking Chantix 01/12/19; approx. 5 cigs/day as of 06/13/2019  Substance and Sexual Activity  . Alcohol use: No    Alcohol/week: 0.0 standard drinks  . Drug use: No  . Sexual activity: Not Currently  Other Topics Concern  . Not on file  Social History Narrative   DIET: regular      DO YOU DRINK/EAT THINGS WITH CAFFEINE: Yes      MARITAL STATUS: Single      WHAT YEAR WERE YOU MARRIED:       DO YOU LIVE IN A HOUSE, APARTMENT,  ASSISTED LIVING, CONDO TRAILER ETC.:  House       IS IT ONE OR MORE STORIES: 1      HOW MANY PERSONS LIVE IN YOUR HOME: 3      DO YOU HAVE PETS IN YOUR HOME: No      CURRENT OR PAST PROFESSION: customer Service Rep      DO YOU EXERCISE: very little      WHAT TYPE AND HOW OFTEN:   Social Determinants of Health   Financial Resource Strain:   . Difficulty of Paying Living Expenses: Not on file  Food Insecurity:   . Worried About Charity fundraiser in the Last Year: Not on file  . Ran Out of Food in the Last Year: Not on file  Transportation Needs:   . Lack of Transportation (Medical): Not on file  . Lack of Transportation (Non-Medical): Not on file  Physical Activity:   . Days of Exercise per Week: Not on file  . Minutes of Exercise per Session: Not on file  Stress:   .  Feeling of Stress : Not on file  Social Connections:   . Frequency of Communication with Friends and Family: Not on file  . Frequency of Social Gatherings with Friends and Family: Not on file  . Attends Religious Services: Not on file  . Active Member of Clubs or Organizations: Not on file  . Attends Archivist Meetings: Not on file  . Marital Status: Not on file  Intimate Partner Violence:   . Fear of Current or Ex-Partner: Not on file  . Emotionally Abused: Not on file  . Physically Abused: Not on file  . Sexually Abused: Not on file    Outpatient Medications Prior to Visit  Medication Sig Dispense Refill  . albuterol (VENTOLIN HFA) 108 (90 Base) MCG/ACT inhaler Inhale 2 puffs into the lungs every 4 (four) hours as needed for wheezing or shortness of breath. 8 g 5  . cetirizine (ZYRTEC) 10 MG tablet Take 1 tablet (10 mg total) by mouth daily. 30 tablet 6  . EPINEPHrine 0.3 mg/0.3 mL IJ SOAJ injection Inject 0.3 mLs (0.3 mg total) into the muscle as needed for anaphylaxis. 2 each 5  . ferrous sulfate 325 (65 FE) MG EC tablet Take 1 tablet (325 mg total) by mouth daily. 90 tablet 1  .  fluticasone-salmeterol (ADVAIR HFA) 230-21 MCG/ACT inhaler TAKE 2 PUFFS BY MOUTH TWICE A DAY 36 Inhaler 2  . ipratropium-albuterol (DUONEB) 0.5-2.5 (3) MG/3ML SOLN INHALE 1 VIAL BY MOUTH VIA NEBULIZER EVERY 6 HOURS AS NEEDED 360 mL 3  . montelukast (SINGULAIR) 10 MG tablet TAKE 1 TABLET BY MOUTH EVERYDAY AT BEDTIME 90 tablet 1  . omalizumab (XOLAIR) 150 MG/ML prefilled syringe Inject 300 mg into the skin every 14 (fourteen) days. 4 mL 12  . tiotropium (SPIRIVA HANDIHALER) 18 MCG inhalation capsule Place 1 capsule (18 mcg total) into inhaler and inhale daily. 30 capsule 6  . varenicline (CHANTIX PAK) 0.5 MG X 11 & 1 MG X 42 tablet Take by mouth 2 (two) times daily. Take one 0.5 mg tablet by mouth once daily for 3 days, then increase to one 0.5 mg tablet twice daily for 4 days, then increase to one 1 mg tablet twice daily.    Marland Kitchen guaiFENesin (MUCINEX) 600 MG 12 hr tablet Take 600 mg by mouth as needed.     . predniSONE (DELTASONE) 10 MG tablet Take 5 tablets (50 mg total) by mouth daily for 5 days. (Patient not taking: Reported on 10/24/2019) 25 tablet 0  . furosemide (LASIX) 20 MG tablet 1 daily x 4 days for fluid (Patient not taking: Reported on 10/24/2019) 4 tablet 0   No facility-administered medications prior to visit.    Allergies  Allergen Reactions  . Penicillins     Childhood Allergy Did it involve swelling of the face/tongue/throat, SOB, or low BP? UNK Did it involve sudden or severe rash/hives, skin peeling, or any reaction on the inside of your mouth or nose? UNK Did you need to seek medical attention at a hospital or doctor's office? UNK When did it last happen?more than 10 years If all above answers are "NO", may proceed with cephalosporin use.     ROS Review of Systems    Objective:    Physical Exam  BP 120/64 (BP Location: Left Arm, Patient Position: Sitting, Cuff Size: Large)   Pulse 100   Temp 98.3 F (36.8 C) (Oral)   Ht 5' 1.8" (1.57 m)   Wt (!) 360 lb (163.3  kg)  BMI 66.27 kg/m  Wt Readings from Last 3 Encounters:  10/24/19 (!) 360 lb (163.3 kg)  10/19/19 (!) 350 lb 1.5 oz (158.8 kg)  08/28/19 (!) 350 lb (158.8 kg)     Health Maintenance Due  Topic Date Due  . TETANUS/TDAP  12/20/2018    There are no preventive care reminders to display for this patient.  Lab Results  Component Value Date   TSH 0.470 11/21/2018   Lab Results  Component Value Date   WBC 11.5 (H) 10/19/2019   HGB 11.4 (L) 10/19/2019   HCT 39.9 10/19/2019   MCV 86.7 10/19/2019   PLT 334 10/19/2019   Lab Results  Component Value Date   NA 137 10/19/2019   K 4.8 10/19/2019   CO2 26 10/19/2019   GLUCOSE 80 10/19/2019   BUN 7 10/19/2019   CREATININE 0.84 10/19/2019   BILITOT 0.9 10/19/2019   ALKPHOS 72 10/19/2019   AST 33 10/19/2019   ALT 16 10/19/2019   PROT 7.3 10/19/2019   ALBUMIN 3.8 10/19/2019   CALCIUM 8.6 (L) 10/19/2019   ANIONGAP 9 10/19/2019   GFR 104.19 04/04/2019   Lab Results  Component Value Date   CHOL 104 03/23/2017   Lab Results  Component Value Date   HDL 63 03/23/2017   Lab Results  Component Value Date   LDLCALC 29 03/23/2017   Lab Results  Component Value Date   TRIG 58 03/23/2017   Lab Results  Component Value Date   CHOLHDL 1.7 03/23/2017   Lab Results  Component Value Date   HGBA1C 5.1 07/11/2019      Assessment & Plan:   Problem List Items Addressed This Visit      Other   Class 3 severe obesity due to excess calories without serious comorbidity with body mass index (BMI) of 60.0 to 69.9 in adult [facility])   Relevant Orders   Insulin, random(561)    Other Visit Diagnoses    Chronic obstructive pulmonary disease, unspecified COPD type ([place])   (Chronic)  -  Primary   Relevant Medications   varenicline (CHANTIX PAK) 0.5 MG X 11 & 1 MG X 42 tablet      No orders of the defined types were placed in this encounter.   Follow-up: Return for 1 month follow up weight.    [address] [place], [place]

## 2019-10-24 NOTE — Progress Notes (Signed)
This visit occurred during the SARS-CoV-2 public health emergency.  Safety protocols were in place, including screening questions prior to the visit, additional usage of staff PPE, and extensive cleaning of exam room while observing appropriate contact time as indicated for disinfecting solutions.  Subjective:     Patient ID: April Ellis , female    DOB: February 28, 1976 , 44 y.o.   MRN: [id number]   Chief Complaint  Patient presents with  . Establish Care  . Weight Loss    patient has tried taking phentermine in the past and she stated it did work for her     HPI  Here to establish care - she was going to [facility].  Her previous provider left in 2019 and remained until she wanted to make a change.  She was referred by a co-worker.  Last visit was in December 2020.  Works at [employer], currently working from home. Single. One son - healthy.    PMH - Asthma (Dr. Annamaria Boots - last seen Tuesday (bi weekly allergy shots)) developed as an adult.  In 2015 she was hospitalized.  In college she began smoking, had quit for 2 years.  Started back in 2018-2019.  She is currently taking Chantix.    [facility] - mother hypertension, diabetes, ESRD with dialysis (2015).  Father - hypertension, one kidney (unknown reason of removal of other kidney), older sister - diabetic. Sister - diabetic, sister - hypertension, possible diabetes. Brother - unknown.   She is interested in losing weight - she reports she has always had issues with her weight.  Her heaviest weight was over 400 lbs in 2016-2017, she had been on phentermine for a few months a total of 6 months - she thinks she lost approximately 50 lbs.  She has done green smoothies, changed her diet.  She does not eat a lot of food but what she does eat and the time she eats.  She had started moving more at that time. She has attempted at vegan diet for brief times. Her goals are "this is my last year in this body".  Currently her motivation is decreased, just  getting started.    She reports does not want to be bothered, having disarray, wants lay down.    Wt Readings from Last 3 Encounters: 10/24/19 : (!) 360 lb (163.3 kg) 10/19/19 : (!) 350 lb 1.5 oz (158.8 kg) 08/28/19 : (!) 350 lb (158.8 kg)  LMP - new years eve to Jan 22nd.     Past Medical History:  Diagnosis Date  . Anemia   . Asthma   . Bronchitis   . COPD (chronic obstructive pulmonary disease) ([place])   . Obesity      Family History  Problem Relation Age of Onset  . Diabetes Mother   . Cancer Mother   . Diabetes Sister   . Hypertension Sister   . Hypertension Sister   . Urticaria Neg Hx   . Immunodeficiency Neg Hx   . Eczema Neg Hx   . Asthma Neg Hx   . Angioedema Neg Hx   . Allergic rhinitis Neg Hx      Current Outpatient Medications:  .  albuterol (VENTOLIN HFA) 108 (90 Base) MCG/ACT inhaler, Inhale 2 puffs into the lungs every 4 (four) hours as needed for wheezing or shortness of breath., Disp: 8 g, Rfl: 5 .  cetirizine (ZYRTEC) 10 MG tablet, Take 1 tablet (10 mg total) by mouth daily., Disp: 30 tablet, Rfl: 6 .  EPINEPHrine 0.3 mg/0.3 mL IJ SOAJ injection, Inject 0.3 mLs (0.3 mg total) into the muscle as needed for anaphylaxis., Disp: 2 each, Rfl: 5 .  ferrous sulfate 325 (65 FE) MG EC tablet, Take 1 tablet (325 mg total) by mouth daily., Disp: 90 tablet, Rfl: 1 .  fluticasone-salmeterol (ADVAIR HFA) 230-21 MCG/ACT inhaler, TAKE 2 PUFFS BY MOUTH TWICE A DAY, Disp: 36 Inhaler, Rfl: 2 .  ipratropium-albuterol (DUONEB) 0.5-2.5 (3) MG/3ML SOLN, INHALE 1 VIAL BY MOUTH VIA NEBULIZER EVERY 6 HOURS AS NEEDED, Disp: 360 mL, Rfl: 3 .  montelukast (SINGULAIR) 10 MG tablet, TAKE 1 TABLET BY MOUTH EVERYDAY AT BEDTIME, Disp: 90 tablet, Rfl: 1 .  omalizumab (XOLAIR) 150 MG/ML prefilled syringe, Inject 300 mg into the skin every 14 (fourteen) days., Disp: 4 mL, Rfl: 12 .  tiotropium (SPIRIVA HANDIHALER) 18 MCG inhalation capsule, Place 1 capsule (18 mcg total) into inhaler and  inhale daily., Disp: 30 capsule, Rfl: 6 .  varenicline (CHANTIX PAK) 0.5 MG X 11 & 1 MG X 42 tablet, Take by mouth 2 (two) times daily. Take one 0.5 mg tablet by mouth once daily for 3 days, then increase to one 0.5 mg tablet twice daily for 4 days, then increase to one 1 mg tablet twice daily., Disp: , Rfl:  .  guaiFENesin (MUCINEX) 600 MG 12 hr tablet, Take 600 mg by mouth as needed. , Disp: , Rfl:  .  predniSONE (DELTASONE) 10 MG tablet, Take 5 tablets (50 mg total) by mouth daily for 5 days. (Patient not taking: Reported on 10/24/2019), Disp: 25 tablet, Rfl: 0   Allergies  Allergen Reactions  . Penicillins     Childhood Allergy Did it involve swelling of the face/tongue/throat, SOB, or low BP? UNK Did it involve sudden or severe rash/hives, skin peeling, or any reaction on the inside of your mouth or nose? UNK Did you need to seek medical attention at a hospital or doctor's office? UNK When did it last happen?more than 10 years If all above answers are "NO", may proceed with cephalosporin use.      Review of Systems  Constitutional: Negative.   Respiratory: Negative.   Cardiovascular: Negative for chest pain, palpitations and leg swelling.  Endocrine: Negative for polydipsia, polyphagia and polyuria.  Neurological: Negative.   Psychiatric/Behavioral: Negative.  Negative for agitation and confusion.     Today's Vitals   10/24/19 1154  BP: 120/64  Pulse: 100  Temp: 98.3 F (36.8 C)  TempSrc: Oral  Weight: (!) 360 lb (163.3 kg)  Height: 5' 1.8" (1.57 m)  PainSc: 0-No pain   Body mass index is 66.27 kg/m.   Objective:  Physical Exam Constitutional:      General: She is not in acute distress.    Appearance: Normal appearance. She is obese.  Cardiovascular:     Rate and Rhythm: Normal rate and regular rhythm.     Pulses: Normal pulses.     Heart sounds: Normal heart sounds. No murmur.  Pulmonary:     Effort: Pulmonary effort is normal. No respiratory distress.      Breath sounds: Normal breath sounds.  Neurological:     General: No focal deficit present.     Mental Status: She is alert and oriented to person, place, and time.  Psychiatric:        Mood and Affect: Mood normal.        Behavior: Behavior normal.        Thought Content: Thought content normal.  Judgment: Judgment normal.         Assessment And Plan:     1. Chronic obstructive pulmonary disease, unspecified COPD type ([place])  Reports a history of COPD no recent exacerbations  2. Class 3 severe obesity due to excess calories without serious comorbidity with body mass index (BMI) of 60.0 to 69.9 in adult [facility])  She is interested in weight loss  Will check insulin levels   Will consider starting on phentermine after review of chart - Insulin, random(561)     [name], FNP    THE PATIENT IS ENCOURAGED TO PRACTICE SOCIAL DISTANCING DUE TO THE COVID-19 PANDEMIC.

## 2019-10-25 LAB — INSULIN, RANDOM: INSULIN: 15.3 u[IU]/mL (ref 2.6–24.9)

## 2019-10-27 ENCOUNTER — Encounter: Payer: Self-pay | Admitting: Internal Medicine

## 2019-10-27 NOTE — Assessment & Plan Note (Signed)
Finances limiting access to meds. Plan- emphasize xolair, use of nebulizer machine

## 2019-10-27 NOTE — Assessment & Plan Note (Signed)
Has used Chantix, but not fully off cigarettes and not really trying. Motivation discussed.

## 2019-10-27 NOTE — Assessment & Plan Note (Signed)
CPAP is effective when used. Emphasis on comfort issues, compliance goals.  Plan- continue auto 5-15

## 2019-10-31 ENCOUNTER — Ambulatory Visit (INDEPENDENT_AMBULATORY_CARE_PROVIDER_SITE_OTHER): Payer: BC Managed Care – PPO

## 2019-10-31 ENCOUNTER — Other Ambulatory Visit: Payer: Self-pay

## 2019-10-31 DIAGNOSIS — J4551 Severe persistent asthma with (acute) exacerbation: Secondary | ICD-10-CM

## 2019-10-31 MED ORDER — OMALIZUMAB 150 MG/ML ~~LOC~~ SOSY
300.0000 mg | PREFILLED_SYRINGE | Freq: Once | SUBCUTANEOUS | Status: AC
  Administered 2019-10-31: 300 mg via SUBCUTANEOUS

## 2019-10-31 NOTE — Progress Notes (Signed)
Have you been hospitalized within the last 10 days?  No Do you have a fever?  No Do you have a cough?  No Do you have a headache or sore throat? No Do you have your Epi Pen visible and is it within date?  Yes 

## 2019-11-05 ENCOUNTER — Telehealth: Payer: Self-pay | Admitting: Internal Medicine

## 2019-11-05 NOTE — Telephone Encounter (Signed)
Xolair Prefilled Syringe Order: 150mg  Prefilled Syringe:  #4 75mg  Prefilled Syringe: #N/A Ordered Date: 11/05/19 Expected date of arrival: 11/09/19 Ordered by: [place]: [facility]

## 2019-11-09 NOTE — Telephone Encounter (Signed)
Xolair Prefilled Syringe Received:  150mg  Prefilled Syringe >> quantity #4, lot # [id number], exp date 05/28/2020 75mg  Prefilled Syringe >> N/A Medication arrival date: 11/09/19 Received by: Alexx Giambra,LPN

## 2019-11-14 ENCOUNTER — Other Ambulatory Visit: Payer: Self-pay

## 2019-11-14 ENCOUNTER — Ambulatory Visit (INDEPENDENT_AMBULATORY_CARE_PROVIDER_SITE_OTHER): Payer: BC Managed Care – PPO

## 2019-11-14 DIAGNOSIS — J4551 Severe persistent asthma with (acute) exacerbation: Secondary | ICD-10-CM | POA: Diagnosis not present

## 2019-11-14 MED ORDER — OMALIZUMAB 150 MG/ML ~~LOC~~ SOSY
300.0000 mg | PREFILLED_SYRINGE | Freq: Once | SUBCUTANEOUS | Status: AC
  Administered 2019-11-14: 17:00:00 300 mg via SUBCUTANEOUS

## 2019-11-14 NOTE — Progress Notes (Signed)
Have you been hospitalized within the last 10 days?  No Do you have a fever?  No Do you have a cough?  No Do you have a headache or sore throat? No Do you have your Epi Pen visible and is it within date?  Yes 

## 2019-11-26 ENCOUNTER — Encounter: Payer: Self-pay | Admitting: Nurse Practitioner

## 2019-11-26 ENCOUNTER — Ambulatory Visit (INDEPENDENT_AMBULATORY_CARE_PROVIDER_SITE_OTHER): Payer: BC Managed Care – PPO | Admitting: Nurse Practitioner

## 2019-11-26 ENCOUNTER — Other Ambulatory Visit: Payer: Self-pay

## 2019-11-26 VITALS — BP 122/80 | HR 101 | Temp 98.2°F | Ht 62.8 in | Wt 363.0 lb

## 2019-11-26 DIAGNOSIS — M545 Low back pain, unspecified: Secondary | ICD-10-CM

## 2019-11-26 DIAGNOSIS — Z6841 Body Mass Index (BMI) 40.0 and over, adult: Secondary | ICD-10-CM | POA: Diagnosis not present

## 2019-11-26 DIAGNOSIS — R809 Proteinuria, unspecified: Secondary | ICD-10-CM

## 2019-11-26 DIAGNOSIS — E66813 Obesity, class 3: Secondary | ICD-10-CM

## 2019-11-26 LAB — POCT URINALYSIS DIPSTICK
Bilirubin, UA: NEGATIVE
Blood, UA: NEGATIVE
Glucose, UA: NEGATIVE
Ketones, UA: NEGATIVE
Nitrite, UA: NEGATIVE
Protein, UA: NEGATIVE
Spec Grav, UA: 1.025 (ref 1.010–1.025)
Urobilinogen, UA: 1 E.U./dL
pH, UA: 5.5 (ref 5.0–8.0)

## 2019-11-26 MED ORDER — SAXENDA 18 MG/3ML ~~LOC~~ SOPN: 3.0000 mg | PEN_INJECTOR | Freq: Every day | SUBCUTANEOUS | 1 refills | Status: DC

## 2019-11-26 NOTE — Patient Instructions (Signed)

## 2019-11-26 NOTE — Progress Notes (Signed)
This visit occurred during the SARS-CoV-2 public health emergency.  Safety protocols were in place, including screening questions prior to the visit, additional usage of staff PPE, and extensive cleaning of exam room while observing appropriate contact time as indicated for disinfecting solutions.  Subjective:     Patient ID: April Ellis , female    DOB: [birth year] , 44 y.o.   MRN: [id number]   Chief Complaint  Patient presents with  . Weight Check    patient presents today for a one month f/u     HPI  Here for recheck her weight, she just started phentermine   Wt Readings from Last 3 Encounters: 11/26/19 : (!) 363 lb (164.7 kg) 10/24/19 : (!) 360 lb (163.3 kg) 10/19/19 : (!) 350 lb 1.5 oz (158.8 kg)  She is trying to exercise regularly. She ordered a scale from her insurance.  She is trying to move more.     She would like to start her chantix that was given to her previous provider.  She has had an elevated heart rate in the past thought to be related to using albuterol during an asthma attack.     Past Medical History:  Diagnosis Date  . Anemia   . Asthma   . Bronchitis   . COPD (chronic obstructive pulmonary disease) ([place])   . Obesity      Family History  Problem Relation Age of Onset  . Diabetes Mother   . Cancer Mother   . Diabetes Sister   . Hypertension Sister   . Hypertension Sister   . Urticaria Neg Hx   . Immunodeficiency Neg Hx   . Eczema Neg Hx   . Asthma Neg Hx   . Angioedema Neg Hx   . Allergic rhinitis Neg Hx      Current Outpatient Medications:  .  albuterol (VENTOLIN HFA) 108 (90 Base) MCG/ACT inhaler, Inhale 2 puffs into the lungs every 4 (four) hours as needed for wheezing or shortness of breath., Disp: 8 g, Rfl: 5 .  cetirizine (ZYRTEC) 10 MG tablet, Take 1 tablet (10 mg total) by mouth daily., Disp: 30 tablet, Rfl: 6 .  EPINEPHrine 0.3 mg/0.3 mL IJ SOAJ injection, Inject 0.3 mLs (0.3 mg total) into the muscle as needed for anaphylaxis.,  Disp: 2 each, Rfl: 5 .  ferrous sulfate 325 (65 FE) MG EC tablet, Take 1 tablet (325 mg total) by mouth daily., Disp: 90 tablet, Rfl: 1 .  fluticasone-salmeterol (ADVAIR HFA) 230-21 MCG/ACT inhaler, TAKE 2 PUFFS BY MOUTH TWICE A DAY, Disp: 36 Inhaler, Rfl: 2 .  ipratropium-albuterol (DUONEB) 0.5-2.5 (3) MG/3ML SOLN, INHALE 1 VIAL BY MOUTH VIA NEBULIZER EVERY 6 HOURS AS NEEDED, Disp: 360 mL, Rfl: 3 .  montelukast (SINGULAIR) 10 MG tablet, TAKE 1 TABLET BY MOUTH EVERYDAY AT BEDTIME, Disp: 90 tablet, Rfl: 1 .  omalizumab (XOLAIR) 150 MG/ML prefilled syringe, Inject 300 mg into the skin every 14 (fourteen) days., Disp: 4 mL, Rfl: 12 .  phentermine 15 MG capsule, Take 1 capsule (15 mg total) by mouth every morning., Disp: 30 capsule, Rfl: 0 .  tiotropium (SPIRIVA HANDIHALER) 18 MCG inhalation capsule, Place 1 capsule (18 mcg total) into inhaler and inhale daily., Disp: 30 capsule, Rfl: 6 .  guaiFENesin (MUCINEX) 600 MG 12 hr tablet, Take 600 mg by mouth as needed. , Disp: , Rfl:  .  varenicline (CHANTIX PAK) 0.5 MG X 11 & 1 MG X 42 tablet, Take by mouth 2 (two) times daily. Take  one 0.5 mg tablet by mouth once daily for 3 days, then increase to one 0.5 mg tablet twice daily for 4 days, then increase to one 1 mg tablet twice daily., Disp: , Rfl:    Allergies  Allergen Reactions  . Penicillins     Childhood Allergy Did it involve swelling of the face/tongue/throat, SOB, or low BP? UNK Did it involve sudden or severe rash/hives, skin peeling, or any reaction on the inside of your mouth or nose? UNK Did you need to seek medical attention at a hospital or doctor's office? UNK When did it last happen?more than 10 years If all above answers are "NO", may proceed with cephalosporin use.      Review of Systems  Constitutional: Negative.   Respiratory: Negative.   Cardiovascular: Negative.  Negative for chest pain, palpitations and leg swelling.  Musculoskeletal: Negative.   Neurological: Negative  for dizziness and headaches.     Today's Vitals   11/26/19 1129  BP: 122/80  Pulse: (!) 101  Temp: 98.2 F (36.8 C)  TempSrc: Oral  Weight: (!) 363 lb (164.7 kg)  Height: 5' 2.8" (1.595 m)  PainSc: 0-No pain   Body mass index is 64.71 kg/m.   Objective:  Physical Exam Constitutional:      General: She is not in acute distress.    Appearance: Normal appearance.  Cardiovascular:     Rate and Rhythm: Normal rate and regular rhythm.     Pulses: Normal pulses.     Heart sounds: Normal heart sounds. No murmur.  Pulmonary:     Effort: Pulmonary effort is normal. No respiratory distress.     Breath sounds: Normal breath sounds.  Skin:    Capillary Refill: Capillary refill takes less than 2 seconds.  Neurological:     General: No focal deficit present.     Mental Status: She is alert and oriented to person, place, and time.         Assessment And Plan:      1. Morbid obesity ([place])  Will try saxenda pending insurance approval  Encouraged to increase physical activity as tolerated - Liraglutide -Weight Management (SAXENDA) 18 MG/3ML SOPN; Inject 0.5 mLs (3 mg total) into the skin daily.  Dispense: 5 pen; Refill: 1  2. Class 3 severe obesity due to excess calories without serious comorbidity with body mass index (BMI) of 60.0 to 69.9 in adult ([facility])  - Liraglutide -Weight Management (SAXENDA) 18 MG/3ML SOPN; Inject 0.5 mLs (3 mg total) into the skin daily.  Dispense: 5 pen; Refill: 1  3. Acute right-sided low back pain without sciatica  Encouraged to stretch regularly   Also discussed how excess weight can affect her back pain  4. Proteinuria, unspecified type  She had protein in her urine previously will recheck today - POCT Urinalysis Dipstick (81002)   [name], FNP    THE PATIENT IS ENCOURAGED TO PRACTICE SOCIAL DISTANCING DUE TO THE COVID-19 PANDEMIC.

## 2019-11-27 ENCOUNTER — Telehealth: Payer: Self-pay

## 2019-11-27 NOTE — Telephone Encounter (Signed)
Pt and pharm notified of approval of saxenda cost to pt is $24.99

## 2019-11-28 ENCOUNTER — Ambulatory Visit (INDEPENDENT_AMBULATORY_CARE_PROVIDER_SITE_OTHER): Payer: BC Managed Care – PPO

## 2019-11-28 ENCOUNTER — Other Ambulatory Visit: Payer: Self-pay

## 2019-11-28 DIAGNOSIS — J4551 Severe persistent asthma with (acute) exacerbation: Secondary | ICD-10-CM

## 2019-11-28 MED ORDER — OMALIZUMAB 150 MG/ML ~~LOC~~ SOSY
300.0000 mg | PREFILLED_SYRINGE | Freq: Once | SUBCUTANEOUS | Status: AC
  Administered 2019-11-28: 300 mg via SUBCUTANEOUS

## 2019-11-28 NOTE — Progress Notes (Signed)
All questions were answered by the patient before medication was administered. Have you been hospitalized in the last 10 days? No Do you have a fever? No Do you have a cough? No Do you have a headache or sore throat? No  

## 2019-12-04 ENCOUNTER — Telehealth: Payer: Self-pay | Admitting: Internal Medicine

## 2019-12-04 NOTE — Telephone Encounter (Signed)
Xolair Prefilled Syringe Order: 150mg  Prefilled Syringe:  #4 75mg  Prefilled Syringe: N/A Ordered Date: 12/04/2019 Expected date of arrival: 12/06/2019 Ordered by: Desmond Dike, [place]  Specialty Pharmacy: [facility]

## 2019-12-05 ENCOUNTER — Other Ambulatory Visit: Payer: BC Managed Care – PPO

## 2019-12-06 ENCOUNTER — Other Ambulatory Visit: Payer: Self-pay | Admitting: Primary Care

## 2019-12-06 NOTE — Telephone Encounter (Signed)
Xolair Prefilled Syringe Received:  150mg  Prefilled Syringe >> quantity #4, lot # [id number], exp date 05/2020 75mg  Prefilled Syringe >> N/A Medication arrival date: 12/06/2019 Received by: Desmond Dike, [place]

## 2019-12-12 ENCOUNTER — Ambulatory Visit: Payer: BC Managed Care – PPO

## 2019-12-12 ENCOUNTER — Telehealth: Payer: Self-pay | Admitting: Internal Medicine

## 2019-12-12 ENCOUNTER — Ambulatory Visit: Payer: BC Managed Care – PPO | Admitting: Family

## 2019-12-12 NOTE — Telephone Encounter (Signed)
Spoke with pt. States that she is currently on a prednisone taper for her asthma. Pt is scheduled for a Xolair injection this afternoon at 4:00pm. She wants to know if she needs to reschedule or if she can get her injection as planned.  CY - please advise. Thanks.

## 2019-12-12 NOTE — Telephone Encounter (Signed)
Is she a lot better now on prednisone? If so she can get her shot. If still wheezing a lot, she should reschedule.

## 2019-12-12 NOTE — Telephone Encounter (Signed)
Spoke with pt. States that she just started the prednisone yesterday. Pt's appointment has been rescheduled to 12/19/2019 at 1600. Nothing further needed.

## 2019-12-13 ENCOUNTER — Ambulatory Visit: Payer: BC Managed Care – PPO | Admitting: Family

## 2019-12-19 ENCOUNTER — Other Ambulatory Visit: Payer: Self-pay

## 2019-12-19 ENCOUNTER — Ambulatory Visit (INDEPENDENT_AMBULATORY_CARE_PROVIDER_SITE_OTHER): Payer: BC Managed Care – PPO

## 2019-12-19 DIAGNOSIS — J4551 Severe persistent asthma with (acute) exacerbation: Secondary | ICD-10-CM | POA: Diagnosis not present

## 2019-12-19 MED ORDER — OMALIZUMAB 150 MG/ML ~~LOC~~ SOSY
300.0000 mg | PREFILLED_SYRINGE | Freq: Once | SUBCUTANEOUS | Status: AC
  Administered 2019-12-19: 300 mg via SUBCUTANEOUS

## 2019-12-19 NOTE — Progress Notes (Signed)
All questions were answered by the patient before medication was administered. Have you been hospitalized in the last 10 days? No Do you have a fever? No Do you have a cough? No Do you have a headache or sore throat? No  

## 2019-12-25 ENCOUNTER — Telehealth: Payer: Self-pay | Admitting: Pharmacy Technician

## 2019-12-25 NOTE — Telephone Encounter (Signed)
Received faxed Prior Auth request for Xolair from [facility]. Completed form and placed in MD box for signature. Will follow up. Current PA expires 01/22/20.  2:33 PM Beatriz Chancellor, CPhT

## 2019-12-31 ENCOUNTER — Telehealth: Payer: Self-pay | Admitting: Internal Medicine

## 2019-12-31 DIAGNOSIS — J45909 Unspecified asthma, uncomplicated: Secondary | ICD-10-CM

## 2019-12-31 MED ORDER — ADVAIR HFA 230-21 MCG/ACT IN AERO: INHALATION_SPRAY | RESPIRATORY_TRACT | 0 refills | Status: DC

## 2019-12-31 NOTE — Telephone Encounter (Signed)
Spoke with pt. She is needing a refill on Advair. Rx has been sent in as she has a pending OV with CY on 01/09/20. Nothing further needed.

## 2019-12-31 NOTE — Telephone Encounter (Signed)
Pt returning a phone call. Pt can be reached at [phone].

## 2019-12-31 NOTE — Telephone Encounter (Signed)
LMTCB x1 for pt.  

## 2020-01-02 ENCOUNTER — Telehealth: Payer: Self-pay | Admitting: Internal Medicine

## 2020-01-02 ENCOUNTER — Other Ambulatory Visit: Payer: Self-pay

## 2020-01-02 NOTE — Progress Notes (Signed)
Patient came for Xolair injection.  Patient stated she had eaten shortly before injections appointment, and started having itchy throat, and itchy legs. Patient stated she took Benadryl, and the itching has decreased. Patient denied any sob, throat swelling, tongue, swelling, and had epi pen in reach. Discussed epi pen use, when to seek emergency care. Xolair injection rescheduled for 01/04/20, at 3pm.

## 2020-01-02 NOTE — Telephone Encounter (Signed)
Patient came for Xolair injection.  Patient stated she had eaten shortly before injections appointment, and started having itchy throat, and itchy legs. Patient stated she took Benadryl, and the itching has decreased. Patient denied any sob, throat swelling, tongue, swelling, and had epi pen in reach. Discussed epi pen use, when to seek emergency care. Xolair injection rescheduled for 01/04/20, at 4pm. Patient scheduled follow up with Dr. Annamaria Boots, 01/09/20.   Message routed to Dr. Annamaria Boots as Juluis Rainier

## 2020-01-02 NOTE — Telephone Encounter (Signed)
Patient dropped off intermittent FMLA paperwork for injection appointments,and  exacerbations coverage, for  Dr. Annamaria Boots. Paperwork given to [employer] to send to [employer].  Message routed to [place] as [employer]

## 2020-01-04 ENCOUNTER — Ambulatory Visit (INDEPENDENT_AMBULATORY_CARE_PROVIDER_SITE_OTHER): Payer: BC Managed Care – PPO

## 2020-01-04 ENCOUNTER — Other Ambulatory Visit: Payer: Self-pay

## 2020-01-04 DIAGNOSIS — J45909 Unspecified asthma, uncomplicated: Secondary | ICD-10-CM | POA: Diagnosis not present

## 2020-01-04 MED ORDER — OMALIZUMAB 150 MG/ML ~~LOC~~ SOSY
300.0000 mg | PREFILLED_SYRINGE | Freq: Once | SUBCUTANEOUS | Status: AC
  Administered 2020-01-04: 300 mg via SUBCUTANEOUS

## 2020-01-04 NOTE — Progress Notes (Signed)
Have you been hospitalized within the last 10 days?  No Do you have a fever?  No Do you have a cough?  No Do you have a headache or sore throat? No  

## 2020-01-08 ENCOUNTER — Telehealth: Payer: Self-pay | Admitting: Internal Medicine

## 2020-01-08 NOTE — Telephone Encounter (Signed)
Received signed Prior Authorization form. Faxed to [facility]. Will update when we receive a response.  Fax# [phone] Phone# [phone]  9:10 AM Beatriz Chancellor, CPhT

## 2020-01-08 NOTE — Telephone Encounter (Signed)
Xolair Prefilled Syringe Order: 150mg  Prefilled Syringe:  #4 75mg  Prefilled Syringe: #0 Ordered Date: 01/08/2020 Expected date of arrival: 01/10/2020 Ordered by: Desmond Dike, [place]  [facility]

## 2020-01-09 ENCOUNTER — Encounter: Payer: Self-pay | Admitting: Internal Medicine

## 2020-01-09 ENCOUNTER — Other Ambulatory Visit: Payer: Self-pay

## 2020-01-09 ENCOUNTER — Ambulatory Visit (INDEPENDENT_AMBULATORY_CARE_PROVIDER_SITE_OTHER): Payer: BC Managed Care – PPO | Admitting: Internal Medicine

## 2020-01-09 VITALS — BP 130/78 | HR 107 | Temp 97.8°F | Ht 62.8 in | Wt 359.0 lb

## 2020-01-09 DIAGNOSIS — G4733 Obstructive sleep apnea (adult) (pediatric): Secondary | ICD-10-CM | POA: Diagnosis not present

## 2020-01-09 DIAGNOSIS — J449 Chronic obstructive pulmonary disease, unspecified: Secondary | ICD-10-CM

## 2020-01-09 DIAGNOSIS — J4541 Moderate persistent asthma with (acute) exacerbation: Secondary | ICD-10-CM | POA: Diagnosis not present

## 2020-01-09 DIAGNOSIS — Z720 Tobacco use: Secondary | ICD-10-CM | POA: Diagnosis not present

## 2020-01-09 MED ORDER — BUDESONIDE-FORMOTEROL FUMARATE 160-4.5 MCG/ACT IN AERO: INHALATION_SPRAY | RESPIRATORY_TRACT | 12 refills | Status: DC

## 2020-01-09 MED ORDER — IPRATROPIUM-ALBUTEROL 0.5-2.5 (3) MG/3ML IN SOLN: RESPIRATORY_TRACT | 12 refills | Status: DC

## 2020-01-09 NOTE — Assessment & Plan Note (Signed)
Needs an organized approach- now using chantix and patch. Plan- Pharmacy smoking cessation program.

## 2020-01-09 NOTE — Assessment & Plan Note (Signed)
Very mild originally. Plan- Update HST.

## 2020-01-09 NOTE — Telephone Encounter (Signed)
Spoke with patient this afternoon and gave her the number to [employer] [phone] to contact them regarding her FMLA paperwork and status. Patient's voice was understanding. Nothing else further needed.

## 2020-01-09 NOTE — Patient Instructions (Addendum)
We will find the FMLA renewal form you brought last week  Order- schedule HST   Dx OSA  Order- Pharmacy Smoking Cessation program  Order DC Xolair- ineffective- new order Berna Bue if her insurance covers.   We are stopping Advair- script sent for Symbicort 160- inhale 2 puffs, then rinse mouth, twice daily  Script sent increasing the amount of Duoneb nebulizer solution you get  Please call as needed

## 2020-01-09 NOTE — Progress Notes (Signed)
HPI F smoker followed for Asthma, COPD, Chronic Rhinitis, Urticaria, Morbid Obesity, Iron Def Anemia Office spirometry 2017 by [place] office- mild obstruction PFT 05/01/14- Severe obstruction. 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 IgE 234,  EOS low on repeated steroid bursts (04/04/19) -----------------------------------------------------------------------------   09/06/2019- Virtual Visit via Telephone Note   History of Present Illness: 41  F smoker followed for Asthma/ Xolair, COPD, Chronic Rhinitis, Urticaria,  OSA,  Morbid Obesity, Iron Def Anemia CPAP titration study > 10 cwp ( no centrals noted)     Baseline AHI 9/ hr CPAP auto 5-15/ [facility] ( 07/12/2019) Download compliance 7%, AHI 0.2/ hr ED 08/28/2019- Asthma exacerbation Latest Xolair 12/8 (was her second dose) Albuterol hfa, Advair hfa 230, neb Duoneb, Singulair, Pred taper started 12/1, Spiriva Frequent use of rescue inhaler. Out of Advair and Spiriva due to cost.  Question possible CHF component with fluid retention- to try lasix for effect.   Observations/Objective: CXR 08/28/2019 IMPRESSION: 1. Similar appearance of cardiomegaly and mild pulmonary vascular congestion. 2. No focal airspace disease.  Assessment and Plan: Asthma moderate persistent- cost impacting acces to meds. Waiting for xolair impact. OSA- Emphasize compiance goals ? Fluid overload based on CXR report-  therapeutic trial lasix  Follow Up Instructions: 4 months   Baird Lyons, MD ------------------------------------------------------------------------ 01/09/20- 43  F smoker followed for Asthma/ Xolair, COPD, Chronic Rhinitis, Urticaria,  OSA,  Morbid Obesity, Iron Def Anemia CPAP titration study > 10 cwp ( no centrals noted)     Baseline AHI 9/ hr CPAP auto 5-15/  [facility] ( 07/12/2019) -----f/u Asthma-COPD  Body weight today 359 lbs ED visit 1/22 for asthma exacerbation with room air hypoxia. Albuterol hfa, Advair 250, neb Duoneb, Spiriva, Xolair OnChantix and also using patch- still smoking few cigs /day On Saxenda- trying to lose weight Not using CPAP- non-compliant and DME took it away. Agrees to update sleep study.  Breathing today is ok, but since ED visit in January has used call-in clinic several times for exacerbations and given steroids. Feels Xolair and Advair are not helping. Home nebulizer does help when very occasionally used.  Brought FMLA form for renewal.   ROS-see HPI   + = positive Constitutional:    weight loss, night sweats, fevers, chills, fatigue, lassitude. HEENT:    headaches, difficulty swallowing, tooth/dental problems, sore throat,       sneezing, itching, ear ache, nasal congestion, post nasal drip, snoring CV:    chest pain, orthopnea, PND, swelling in lower extremities, anasarca,                                  dizziness, palpitations Resp:  + shortness of breath with exertion or at rest.                productive cough,   +non-productive cough, coughing up of blood.              change in color of mucus.  +wheezing.   Skin:    rash or lesions. GI:  No-   heartburn, indigestion, abdominal pain, nausea, vomiting, diarrhea,                 change in bowel habits, loss of appetite GU: dysuria, change in color of urine, no  urgency or frequency.   flank pain. MS:   joint pain, stiffness, decreased range of motion, back pain. Neuro-     nothing unusual Psych:  change in mood or affect.  depression or anxiety.   memory loss.  OBJ- Physical Exam General- Alert, Oriented, Affect-appropriate, Distress- none acute, +morbidly obese Skin- rash-none, lesions- none, excoriation- none Lymphadenopathy- none Head- atraumatic            Eyes- Gross vision intact, PERRLA, conjunctivae and secretions clear            Ears- Hearing,  canals-normal            Nose- Clear, no-Septal dev, mucus, polyps, erosion, perforation             Throat- Mallampati III, mucosa clear , drainage- none, tonsils- atrophic Neck- flexible , trachea midline, no stridor , thyroid nl, carotid no bruit Chest - symmetrical excursion , unlabored           Heart/CV- RRR , no murmur , no gallop  , no rub, nl s1 s2                           - JVD- none , edema- none, stasis changes- none, varices- none           Lung- Clear/ unlabored,  wheeze- none, cough- none , dullness-none, rub- none           Chest wall-  Abd-  Br/ Gen/ Rectal- Not done, not indicated Extrem- cyanosis- none, clubbing, none, atrophy- none, strength- nl Neuro- grossly intact to observation

## 2020-01-09 NOTE — Assessment & Plan Note (Signed)
Concerning that she has needed prednisone several times from call-in clinics. Plan- Change Xolair to [facility]. Change Advair to Symbicort she says worked better.

## 2020-01-10 NOTE — Telephone Encounter (Signed)
Xolair Prefilled Syringe Received:  150mg  Prefilled Syringe >> quantity #4, lot # [id number], exp date 07/2020 75mg  Prefilled Syringe >> N/A Medication arrival date: 01/10/2020 Received by: Desmond Dike, [place]

## 2020-01-10 NOTE — Telephone Encounter (Signed)
Received notification from [facility] regarding a prior authorization for XOLAIR. Authorization has been APPROVED from 01/08/20 to 01/07/21.   Authorization # [id number]

## 2020-01-16 ENCOUNTER — Telehealth: Payer: Self-pay | Admitting: Pharmacist

## 2020-01-16 ENCOUNTER — Ambulatory Visit (INDEPENDENT_AMBULATORY_CARE_PROVIDER_SITE_OTHER): Payer: BC Managed Care – PPO | Admitting: Pharmacist

## 2020-01-16 DIAGNOSIS — Z7189 Other specified counseling: Secondary | ICD-10-CM | POA: Diagnosis not present

## 2020-01-16 DIAGNOSIS — F17210 Nicotine dependence, cigarettes, uncomplicated: Secondary | ICD-10-CM

## 2020-01-16 DIAGNOSIS — Z720 Tobacco use: Secondary | ICD-10-CM

## 2020-01-16 MED ORDER — NICOTINE 21 MG/24HR TD PT24: 21.0000 mg | MEDICATED_PATCH | Freq: Every day | TRANSDERMAL | 0 refills | Status: DC

## 2020-01-16 MED ORDER — VARENICLINE TARTRATE 1 MG PO TABS: 1.0000 mg | ORAL_TABLET | Freq: Two times a day (BID) | ORAL | 3 refills | Status: DC

## 2020-01-16 NOTE — Patient Instructions (Addendum)
Reviewed STAR Quit Plan.   S-set quit date: 02/17/20 T-tell everyone: family (son, sister), co-workers A-anticipate challenges: stress, after a meal, after a cup of coffee . Patient picked 3 alternative activities to try instead of smoking a cigarette 1) reading 2) listen/ create podcast 3) chew a piece of hard candy or flavored toothpicks R- remove all tobacco products: patient will trash cigarettes, lighters, and ash trays by 02/16/20  Patch Schedule for >10 cigarettes daily Weeks 1-6: one 21 mg patch daily Weeks 7-8: one 14 mg patch daily Weeks 9-10: one 7 mg patch daily   We will call with any updates we find out about Fasenra injection.  Thank you for meeting with the pharmacy team today!  Please call [phone] with any questions or concerns.

## 2020-01-16 NOTE — Progress Notes (Signed)
Subjective Patient presents to [facility] and seen by the pharmacist for smoking cessation counseling.    She has tried Wellbutrin in the past but was not adherent.  Started taking Chantix consistently 2 weeks ago. She states it has decreased her cravings but not completely.  She has not bought any cigarettes but will obtain from friends.  She doesn't smoke in the house but does smoke in the car. She does not smoke on her work breaks.  Today she has only smoked 1 cigarette. She was previously smoking a pack of cigarettes a day.  Social History   Tobacco Use  Smoking Status Light Tobacco Smoker  . Packs/day: 0.25  . Years: 15.00  . Pack years: 3.75  . Types: Cigarettes  . Last attempt to quit: 09/16/2014  . Years since quitting: 5.3  Smokeless Tobacco Never Used  Tobacco Comment   smoking 2-3 ciggs per day/taking Chantix 01/12/19; approx. 5 cigs/day as of 06/13/2019     Tobacco Use History  Age when started using tobacco on a daily basis early 20's.  Type: cigarettes.  Number of cigarettes per day 4-6, brand [place].  Smokes first cigarette 30 minutes to an hour after waking.  Does not wake at night to smoke  Triggers include stress, after a meal, with coffee.  Quit Attempt History   Most recent quit attempt 2014.  Longest time ever been tobacco free 2 years in 2014.  Methods tried in the past include Wellbutrin, Chantix, nicotine patches and gum..   Rates IMPORTANCE of quitting tobacco on 1-10 scale of 10.  Rates READINESS of quitting tobacco on 1-10 scale of 10.  Rates CONFIDENCE of quitting tobacco on 1-10 scale of 7.  Motivators to quitting include health and cost; barriers include under a lot of stress now    Immunization History  Administered Date(s) Administered  . Influenza, Quadrivalent, Recombinant, Inj, Pf 09/06/2019  . PFIZER SARS-COV-2 Vaccination 12/05/2019, 12/26/2019  . Td 12/19/2008     Assessment and Plan  1. Smoking Cessation    Patient states they are ready to quit smoking.  Patient set quit date of 02/17/20. Reviewed STAR Quit plan.  Discussed smoking cessation agent Chant, Wellbutrin, and nicotine replacement therapies.  Patient is agreeable to continuing Chantix and nicotine patch. She denies any side effects or difficulty affording smoking cessation agents.  Nicotine Patch  Patient counseled on purpose, proper use, and potential adverse effects, including mild itching or redness at the point of application, headache, trouble sleeping, and/or vivid dreams     Patch Schedule for >10 cigarettes daily Weeks 1-6: one 21 mg patch daily Weeks 7-8: one 14 mg patch daily Weeks 9-10: one 7 mg patch daily  Varenicline Patient counseled on purpose, proper use, and potential adverse effects, including GI upset, and potential change in mood.   She will continue with varenicline continuing month pack of 1 mg by mouth twice daily with food.  CrCL greater than 30 mL/min.  Provided information on 1 800-QUIT NOW support program.   [facility] nicotine patch and [facility].  Will call patient shortly after quit date for follow up.  2. Biologics (Xolair vs. Berna Bue)  During medication reconciliation patient inquired about update on alternative injection to Xolair.  After reviewing chart at last visit decision was made to switch from [place] to [place].  Do not see where benefits investigation was started for [facility].  Discussed medical vs. Pharmacy benefit coverage through insurance.  Patient is amenable to at home administration if not covered under the medical benefit as she is currently taking injectable Saxenda daily.. We will start the benefits investigation process.  She has Pharmacist, community and should be eligible to use a co-pay card.  Instructed patient to continue Xolair until we hear back from insurance in regards to [place] approval.  3. Medication Reconciliation A  drug regimen assessment was performed, including review of allergies, interactions, disease-state management, dosing and immunization history. Medications were reviewed with the patient, including name, instructions, indication, goals of therapy, potential side effects, importance of adherence, and safe use.  4. Immunizations Patient is up to date with influenzae and COVID-19 vaccine.  Recommend the Pneumovax 23 vaccine.  PLAN  CONTINUE nicotine 21 mg patch daily and chantix 1mg  twice daily with food.  Prescription refill sent to pharmacy  Quit date set for 02/17/20 with follow up phone call shortly after  CONTINUE   Initiate benefits investigation for [facility]  All questions encouraged and answered.  Instructed patient to call with any further questions or concerns.  Thank you for allowing pharmacy to participate in this patient's care.  This appointment required 50 minutes of patient care (this includes precharting, chart review, review of results, face-to-face care, etc.).   Mariella Saa, PharmD, [place], [place] Clinical Specialty Pharmacist [phone]  01/16/2020 3:12 PM

## 2020-01-16 NOTE — Telephone Encounter (Signed)
Please start BIV for April Ellis for severe asthma.  Patient has tried and failed Xolair.  Also can you see how much Spiriva is through insurance.  Provider's last note mentions adherence issue due to costs.   Mariella Saa, PharmD, [place], [place] Clinical Specialty Pharmacist [phone]  01/16/2020 4:23 PM

## 2020-01-17 NOTE — Telephone Encounter (Signed)
Submitted a Prior Authorization request to [facility] for [facility] via Cover My Meds. Will update once we receive a response.   [facility] Case ID: [id number]

## 2020-01-18 ENCOUNTER — Ambulatory Visit: Payer: BC Managed Care – PPO

## 2020-01-21 DIAGNOSIS — D649 Anemia, unspecified: Secondary | ICD-10-CM | POA: Diagnosis not present

## 2020-01-21 DIAGNOSIS — N939 Abnormal uterine and vaginal bleeding, unspecified: Secondary | ICD-10-CM | POA: Diagnosis not present

## 2020-01-23 ENCOUNTER — Ambulatory Visit (INDEPENDENT_AMBULATORY_CARE_PROVIDER_SITE_OTHER): Payer: BC Managed Care – PPO

## 2020-01-23 ENCOUNTER — Other Ambulatory Visit: Payer: Self-pay

## 2020-01-23 DIAGNOSIS — J4541 Moderate persistent asthma with (acute) exacerbation: Secondary | ICD-10-CM

## 2020-01-23 MED ORDER — OMALIZUMAB 150 MG/ML ~~LOC~~ SOSY
300.0000 mg | PREFILLED_SYRINGE | Freq: Once | SUBCUTANEOUS | Status: AC
  Administered 2020-01-23: 300 mg via SUBCUTANEOUS

## 2020-01-23 NOTE — Progress Notes (Signed)
Have you been hospitalized within the last 10 days?  No Do you have a fever?  No Do you have a cough?  No Do you have a headache or sore throat? No Do you have your Epi Pen visible and is it within date?  Yes 

## 2020-01-24 ENCOUNTER — Other Ambulatory Visit: Payer: Self-pay | Admitting: Nurse Practitioner

## 2020-01-28 ENCOUNTER — Other Ambulatory Visit: Payer: Self-pay

## 2020-01-28 ENCOUNTER — Encounter: Payer: Self-pay | Admitting: Nurse Practitioner

## 2020-01-28 ENCOUNTER — Ambulatory Visit (INDEPENDENT_AMBULATORY_CARE_PROVIDER_SITE_OTHER): Payer: BC Managed Care – PPO | Admitting: Nurse Practitioner

## 2020-01-28 DIAGNOSIS — D649 Anemia, unspecified: Secondary | ICD-10-CM | POA: Diagnosis not present

## 2020-01-28 DIAGNOSIS — N939 Abnormal uterine and vaginal bleeding, unspecified: Secondary | ICD-10-CM | POA: Diagnosis not present

## 2020-01-28 DIAGNOSIS — N921 Excessive and frequent menstruation with irregular cycle: Secondary | ICD-10-CM | POA: Diagnosis not present

## 2020-01-28 DIAGNOSIS — Z6841 Body Mass Index (BMI) 40.0 and over, adult: Secondary | ICD-10-CM | POA: Diagnosis not present

## 2020-01-28 NOTE — Progress Notes (Signed)
This visit occurred during the SARS-CoV-2 public health emergency.  Safety protocols were in place, including screening questions prior to the visit, additional usage of staff PPE, and extensive cleaning of exam room while observing appropriate contact time as indicated for disinfecting solutions.  Subjective:     Patient ID: April Ellis , female    DOB: Jul 23, 1976 , 44 y.o.   MRN: [id number]   Chief Complaint  Patient presents with  . Weight Managment    HPI  Here for recheck her weight, she is taking Saxenda. She is having nausea. She has also been taking steroids for her asthma exacerbations. She reports on April 1st her blood pressure was 374 lbs. She reports at the gyn her weight was 355 lbs.  She has had her menstrual cycle on for the last month.  She is scheduled for an ultrasound today at 2pm with Dr. Matthew Saras.  Her Hgb   Wt Readings from Last 3 Encounters: 01/28/20 : (!) 360 lb 12.8 oz (163.7 kg) 01/09/20 : (!) 359 lb (162.8 kg) 11/26/19 : (!) 363 lb (164.7 kg)  She is not exercising due to her menstrual cycle being on for the last month.     She reports having FMLA which is in the system. She is feeling like she needs to be off for an extended period of time.  She is also wearing a nicotine patch and gum     Past Medical History:  Diagnosis Date  . Anemia   . Asthma   . Bronchitis   . COPD (chronic obstructive pulmonary disease) ([place])   . Obesity      Family History  Problem Relation Age of Onset  . Diabetes Mother   . Cancer Mother   . Diabetes Sister   . Hypertension Sister   . Hypertension Sister   . Urticaria Neg Hx   . Immunodeficiency Neg Hx   . Eczema Neg Hx   . Asthma Neg Hx   . Angioedema Neg Hx   . Allergic rhinitis Neg Hx      Current Outpatient Medications:  .  albuterol (VENTOLIN HFA) 108 (90 Base) MCG/ACT inhaler, Inhale 2 puffs into the lungs every 4 (four) hours as needed for wheezing or shortness of breath., Disp: 8 g, Rfl: 5 .   budesonide-formoterol (SYMBICORT) 160-4.5 MCG/ACT inhaler, Inhale 2 puffs, then rinse mouth, twice daily, Disp: 1 Inhaler, Rfl: 12 .  EPINEPHrine 0.3 mg/0.3 mL IJ SOAJ injection, Inject 0.3 mLs (0.3 mg total) into the muscle as needed for anaphylaxis., Disp: 2 each, Rfl: 5 .  ferrous sulfate 325 (65 FE) MG EC tablet, Take 1 tablet (325 mg total) by mouth daily., Disp: 90 tablet, Rfl: 1 .  guaiFENesin (MUCINEX) 600 MG 12 hr tablet, Take 600 mg by mouth as needed. , Disp: , Rfl:  .  ipratropium-albuterol (DUONEB) 0.5-2.5 (3) MG/3ML SOLN, INHALE 1 VIAL BY MOUTH VIA NEBULIZER EVERY 6 HOURS AS NEEDED, Disp: 360 mL, Rfl: 12 .  montelukast (SINGULAIR) 10 MG tablet, TAKE 1 TABLET BY MOUTH EVERYDAY AT BEDTIME, Disp: 90 tablet, Rfl: 1 .  nicotine (NICODERM CQ - DOSED IN MG/24 HOURS) 21 mg/24hr patch, Place 1 patch (21 mg total) onto the skin daily., Disp: 7 patch, Rfl: 0 .  omalizumab (XOLAIR) 150 MG/ML prefilled syringe, Inject 300 mg into the skin every 14 (fourteen) days., Disp: 4 mL, Rfl: 12 .  SAXENDA 18 MG/3ML SOPN, INJECT 0.5 MLS (3 MG TOTAL) INTO THE SKIN DAILY., Disp: 3 mL,  Rfl: 1 .  SPIRIVA HANDIHALER 18 MCG inhalation capsule, INHALE 1 CAPSULE VIA HANDIHALER ONCE DAILY AT THE SAME TIME EVERY DAY, Disp: 30 capsule, Rfl: 3 .  varenicline (CHANTIX CONTINUING MONTH PAK) 1 MG tablet, Take 1 tablet (1 mg total) by mouth 2 (two) times daily., Disp: 60 tablet, Rfl: 3   Allergies  Allergen Reactions  . Penicillins     Childhood Allergy Did it involve swelling of the face/tongue/throat, SOB, or low BP? UNK Did it involve sudden or severe rash/hives, skin peeling, or any reaction on the inside of your mouth or nose? UNK Did you need to seek medical attention at a hospital or doctor's office? UNK When did it last happen?more than 10 years If all above answers are "NO", may proceed with cephalosporin use.      Review of Systems  Constitutional: Negative.   Respiratory: Negative.    Cardiovascular: Negative.  Negative for chest pain, palpitations and leg swelling.  Musculoskeletal: Negative.   Neurological: Negative for dizziness and headaches.  Psychiatric/Behavioral: Negative.      Today's Vitals   01/28/20 0937  BP: 140/88  Pulse: 100  Temp: 98 F (36.7 C)  Weight: (!) 360 lb 12.8 oz (163.7 kg)  Height: 5\' 2"  (1.575 m)   Body mass index is 65.99 kg/m.   Objective:  Physical Exam Constitutional:      General: She is not in acute distress.    Appearance: Normal appearance.  Cardiovascular:     Rate and Rhythm: Normal rate and regular rhythm.     Pulses: Normal pulses.     Heart sounds: Normal heart sounds. No murmur.  Pulmonary:     Effort: Pulmonary effort is normal. No respiratory distress.     Breath sounds: Normal breath sounds.  Skin:    Capillary Refill: Capillary refill takes less than 2 seconds.  Neurological:     General: No focal deficit present.     Mental Status: She is alert and oriented to person, place, and time.  Psychiatric:        Mood and Affect: Mood normal.        Behavior: Behavior normal.        Thought Content: Thought content normal.        Judgment: Judgment normal.         Assessment And Plan:      1. Morbid obesity ([name])  Chronic, weight is stable compared to the weights we have.  Reports almost a 15 lb weight loss  Continue with Saxenda  2. Class 3 severe obesity due to excess calories without serious comorbidity with body mass index (BMI) of 60.0 to 69.9 in adult [facility])  Encouraged to increase her physical activity  Also avoid sugary foods and drinks, eat a healthy diet  3. Abnormal vaginal bleeding  She has an appt scheduled with GYN today, she is advised to find out from them is she will need to be off for a period of time       [employer], CMA    THE PATIENT IS ENCOURAGED TO PRACTICE SOCIAL DISTANCING DUE TO THE COVID-19 PANDEMIC.

## 2020-01-29 NOTE — Telephone Encounter (Signed)
Received notification from [facility] regarding a prior authorization for [facility]. Authorization has been APPROVED from 01/25/20 to 07/26/20.   Authorization # [id number] Per plan, [facility] must [facility].

## 2020-02-06 ENCOUNTER — Telehealth: Payer: Self-pay | Admitting: Internal Medicine

## 2020-02-06 ENCOUNTER — Encounter: Payer: Self-pay | Admitting: Internal Medicine

## 2020-02-06 ENCOUNTER — Other Ambulatory Visit: Payer: Self-pay

## 2020-02-06 ENCOUNTER — Ambulatory Visit (INDEPENDENT_AMBULATORY_CARE_PROVIDER_SITE_OTHER): Payer: BC Managed Care – PPO | Admitting: Internal Medicine

## 2020-02-06 ENCOUNTER — Ambulatory Visit: Payer: BC Managed Care – PPO

## 2020-02-06 DIAGNOSIS — J449 Chronic obstructive pulmonary disease, unspecified: Secondary | ICD-10-CM | POA: Diagnosis not present

## 2020-02-06 DIAGNOSIS — Z720 Tobacco use: Secondary | ICD-10-CM | POA: Diagnosis not present

## 2020-02-06 MED ORDER — OMALIZUMAB 150 MG/ML ~~LOC~~ SOSY
300.0000 mg | PREFILLED_SYRINGE | Freq: Once | SUBCUTANEOUS | Status: AC
  Administered 2020-02-06: 300 mg via SUBCUTANEOUS

## 2020-02-06 NOTE — Telephone Encounter (Signed)
Per 01/16/20 telephone encounter, placed by pharm team, Patient has been approved for Fasenra through 07/26/20. Ria Comment and I were unaware of this. Patient received Xolair 300mg  injection at visit today, and is scheduled in 2 weeks on  02/20/20, at 4:30 for next injection. Do you want Patient to start Berna Bue next injection appointment in 2 weeks, or wait longer in between Xolair and [place] start? Patient will have to change injection time, if she is to start [facility], for 2 hour office protocol wait, and monitoring. Patient is aware that we are waiting on Dr Janee Morn recommendations.  Message routed to Dr Annamaria Boots  Please route message back to injection pool

## 2020-02-06 NOTE — Assessment & Plan Note (Signed)
Has been stable without exacerbation despite Spring pollen, and credits Symbicort.  Plan- proceed with change from Xolair to [place].

## 2020-02-06 NOTE — Progress Notes (Addendum)
HPI F smoker followed for Asthma, COPD, Chronic Rhinitis, Urticaria, Morbid Obesity, Iron Def Anemia Office spirometry 2017 by [place] office- mild obstruction PFT 05/01/14- Severe obstruction. 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 IgE 234,  EOS low on repeated steroid bursts (04/04/19) -----------------------------------------------------------------------------  01/09/20- 43  F smoker followed for Asthma/ Xolair, COPD, Chronic Rhinitis, Urticaria,  OSA,  Morbid Obesity, Iron Def Anemia CPAP titration study > 10 cwp ( no centrals noted)     Baseline AHI 9/ hr CPAP auto 5-15/ [facility] ( 07/12/2019) -----f/u Asthma-COPD  Body weight today 359 lbs ED visit 1/22 for asthma exacerbation with room air hypoxia. Albuterol hfa, Advair 250, neb Duoneb, Spiriva, Xolair OnChantix and also using patch- still smoking few cigs /day On Saxenda- trying to lose weight Not using CPAP- non-compliant and DME took it away. Agrees to update sleep study.  Breathing today is ok, but since ED visit in January has used call-in clinic several times for exacerbations and given steroids. Feels Xolair and Advair are not helping. Home nebulizer does help when very occasionally used.  Brought FMLA form for renewal.   02/06/20-  Virtual Visit via Telephone Note  I connected with April Ellis on 02/06/20 at 11:30 AM EDT by telephone and verified that I am speaking with the correct person using two identifiers.  Location: Patient: H Provider: O   I discussed the limitations, risks, security and privacy concerns of performing an evaluation and management service by telephone and the availability of in person appointments. I also discussed with the patient that there may be a patient responsible charge related to this service. The patient  expressed understanding and agreed to proceed.   History of Present Illness:  75  F smoker followed for Asthma/ Xolair, COPD, Chronic Rhinitis, Urticaria,  OSA,  Morbid Obesity, Iron Def Anemia Albuterol hfa, Symbicort 160, neb Duocneb, Nicoderm CQ/ Chantix, Xolair, Spiriva,  Doing" a lot better". Credits Symbicort, working well for her. No receent flares, ED visits or prednisone. Xolair was ineffective. She has been approved to switch to [place] and staff will be in touch with her about getting that started. She understands her insurance may want her to give self-injection, which should be ok.  Complex sleep apnea byhST. She was noncompliant with CPAP and DME took it away. She is willing to try again with reorder.   Observations/Objective:   Assessment and Plan: Asthma-COPD Overlap- much better control. Ok to start Berna Bue when available Tobacco use- Has used Nicoderm and Chantix. Pres to keep trying.   Follow Up Instructions: 3 months- Fasenra assessment    I discussed the assessment and treatment plan with the patient. The patient was provided an opportunity to ask questions and all were answered. The patient agreed with the plan and demonstrated an understanding of the instructions.   The patient was advised to call back or seek an in-person evaluation if the symptoms worsen or if the condition fails to improve as anticipated.  I provided 21 minutes of non-face-to-face time during this encounter.   Baird Lyons, MD    ROS-see HPI   + = positive Constitutional:    weight loss, night sweats, fevers, chills, fatigue, lassitude. HEENT:    headaches, difficulty swallowing, tooth/dental problems, sore throat,       sneezing, itching, ear ache, nasal congestion, post nasal drip,  snoring CV:    chest pain, orthopnea, PND, swelling in lower extremities, anasarca,                                  dizziness, palpitations Resp:  + shortness of breath with exertion or at rest.                 productive cough,   +non-productive cough, coughing up of blood.              change in color of mucus.  +wheezing.   Skin:    rash or lesions. GI:  No-   heartburn, indigestion, abdominal pain, nausea, vomiting, diarrhea,                 change in bowel habits, loss of appetite GU: dysuria, change in color of urine, no urgency or frequency.   flank pain. MS:   joint pain, stiffness, decreased range of motion, back pain. Neuro-     nothing unusual Psych:  change in mood or affect.  depression or anxiety.   memory loss.  OBJ- Physical Exam General- Alert, Oriented, Affect-appropriate, Distress- none acute, +morbidly obese Skin- rash-none, lesions- none, excoriation- none Lymphadenopathy- none Head- atraumatic            Eyes- Gross vision intact, PERRLA, conjunctivae and secretions clear            Ears- Hearing, canals-normal            Nose- Clear, no-Septal dev, mucus, polyps, erosion, perforation             Throat- Mallampati III, mucosa clear , drainage- none, tonsils- atrophic Neck- flexible , trachea midline, no stridor , thyroid nl, carotid no bruit Chest - symmetrical excursion , unlabored           Heart/CV- RRR , no murmur , no gallop  , no rub, nl s1 s2                           - JVD- none , edema- none, stasis changes- none, varices- none           Lung- Clear/ unlabored,  wheeze- none, cough- none , dullness-none, rub- none           Chest wall-  Abd-  Br/ Gen/ Rectal- Not done, not indicated Extrem- cyanosis- none, clubbing, none, atrophy- none, strength- nl Neuro- grossly intact to observation

## 2020-02-06 NOTE — Addendum Note (Signed)
Addended by: Elton Sin on: 02/06/2020 04:42 PM   Modules accepted: Orders

## 2020-02-06 NOTE — Assessment & Plan Note (Signed)
Encourage to work with support offered.

## 2020-02-06 NOTE — Patient Instructions (Signed)
We can continue Current inhaled meds- I'm glad they are working well.  We will be in touch about transitioning from [place] to [place].   Please call if we can help

## 2020-02-07 NOTE — Telephone Encounter (Signed)
Ok to go ahead with [employer]. No need to delay after Xolair.

## 2020-02-11 MED ORDER — FASENRA 30 MG/ML ~~LOC~~ SOSY: PREFILLED_SYRINGE | SUBCUTANEOUS | 2 refills | Status: DC

## 2020-02-11 MED ORDER — FASENRA 30 MG/ML ~~LOC~~ SOSY: 30.0000 mg | PREFILLED_SYRINGE | SUBCUTANEOUS | 5 refills | Status: DC

## 2020-02-11 NOTE — Telephone Encounter (Signed)
Called [facility] to set up [facility] delivery.  Verbal prescription for Fasenra loading and maintenance dose given to pharmacist. Will need to call back later this week to set up delivery.  Pharmacist advise 24-48 hours.  Patient will have to come in for [place] injection, 2 hour wait per office protocol, and epi pen needs to be brought in by Patient. Called and spoke with Patient. Patient is scheduled 02/28/20, at 1030. Patient aware of office protocol, 2 hour wait, and to bring epipen.

## 2020-02-14 NOTE — Telephone Encounter (Signed)
Called [facility] to set up shipment. Was advised since this is her first shipment of April Ellis, she has to be the one to set up shipment.  LMTCB x1 for pt.

## 2020-02-15 ENCOUNTER — Ambulatory Visit: Payer: BC Managed Care – PPO | Admitting: Internal Medicine

## 2020-02-18 ENCOUNTER — Telehealth: Payer: Self-pay | Admitting: Internal Medicine

## 2020-02-18 NOTE — Telephone Encounter (Signed)
Spoke with pt. She is aware that she will need to call [facility] to set up her shipment.

## 2020-02-18 NOTE — Telephone Encounter (Signed)
LMTCB x2 for pt 

## 2020-02-18 NOTE — Telephone Encounter (Signed)
See telephone encounter (02/06/2020) on MRN [id number].

## 2020-02-18 NOTE — Telephone Encounter (Signed)
l °

## 2020-02-20 ENCOUNTER — Other Ambulatory Visit: Payer: Self-pay

## 2020-02-20 ENCOUNTER — Ambulatory Visit (INDEPENDENT_AMBULATORY_CARE_PROVIDER_SITE_OTHER): Payer: BC Managed Care – PPO | Admitting: Nurse Practitioner

## 2020-02-20 ENCOUNTER — Ambulatory Visit: Payer: BC Managed Care – PPO

## 2020-02-20 ENCOUNTER — Encounter: Payer: Self-pay | Admitting: Nurse Practitioner

## 2020-02-20 VITALS — BP 128/72 | HR 107 | Temp 98.4°F | Ht 62.0 in | Wt 351.6 lb

## 2020-02-20 DIAGNOSIS — R599 Enlarged lymph nodes, unspecified: Secondary | ICD-10-CM

## 2020-02-20 DIAGNOSIS — Z6841 Body Mass Index (BMI) 40.0 and over, adult: Secondary | ICD-10-CM | POA: Diagnosis not present

## 2020-02-20 DIAGNOSIS — J302 Other seasonal allergic rhinitis: Secondary | ICD-10-CM | POA: Diagnosis not present

## 2020-02-20 MED ORDER — FLONASE SENSIMIST 27.5 MCG/SPRAY NA SUSP: 2.0000 | Freq: Every day | NASAL | 12 refills | Status: DC

## 2020-02-20 NOTE — Telephone Encounter (Signed)
Berna Bue Order: 30mg  #1 prefilled syringe Ordered date: 02/20/2020 Expected date of arrival: 02/21/2020 Ordered by: Desmond Dike, [place]  Specialty Pharmacy: [facility]

## 2020-02-20 NOTE — Progress Notes (Signed)
Established Patient Office Visit  Subjective:  Patient ID: April Ellis, female    DOB: 1975/11/09  Age: 44 y.o. MRN: [id number]  CC:  Chief Complaint  Patient presents with  . swollen lymph nodes    patient stated her lymph nodes were swollen and it was difficult for her to eat    HPI  April Ellis presents for complaint of swollen lymph nodes that started on Saturday on the right side. She said that they are sore to the touch and she notices the pain when she chews. The pain is a 7/10 when it's bothering her.She denies ear and throat pain on the right side. She took tylenol with emergent C and it helped but it is still constant. This has occurred in the past off and on for the past few years.  She states the pain is related to a flare up in her allergies and has noticed in the morning the pain in the morning. She denies any new fever chills or coughs.  -She is also taking Magnesium but no other new medications. She is down 9 pounds today and exercises as her asthma tolerates. 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  -She is trying a plant based diet but struggles with preparations.   Wt Readings from Last 3 Encounters:  02/20/20 (!) 351 lb 9.6 oz (159.5 kg)  01/28/20 (!) 360 lb 12.8 oz (163.7 kg)  01/09/20 (!) 359 lb (162.8 kg)   , Past Medical History:  Diagnosis Date  . Anemia   . Asthma   . Bronchitis   . COPD (chronic obstructive pulmonary disease) ([place])   . Obesity     Past Surgical History:  Procedure Laterality Date  . DENTAL SURGERY     2 teeth pulled, 11/22/14  . NO PAST SURGERIES      Family History  Problem Relation Age of Onset  . Diabetes Mother   . Cancer Mother   . Diabetes Sister   . Hypertension Sister   . Hypertension Sister   . Urticaria Neg Hx   . Immunodeficiency Neg Hx   . Eczema Neg Hx   . Asthma Neg Hx   . Angioedema Neg Hx   . Allergic rhinitis Neg Hx     Social History    Socioeconomic History  . Marital status: Single    Spouse name: Not on file  . Number of children: Not on file  . Years of education: Not on file  . Highest education level: Not on file  Occupational History  . Occupation: customer service rep  Tobacco Use  . Smoking status: Light Tobacco Smoker    Packs/day: 0.25    Years: 15.00    Pack years: 3.75    Types: Cigarettes    Last attempt to quit: 09/16/2014    Years since quitting: 5.4  . Smokeless tobacco: Never Used  Substance and Sexual Activity  . Alcohol use: No    Alcohol/week: 0.0 standard drinks  . Drug use: No  . Sexual activity: Not Currently  Other Topics Concern  . Not on file  Social History Narrative   DIET: regular      DO YOU DRINK/EAT THINGS WITH CAFFEINE: Yes      MARITAL STATUS: Single      WHAT YEAR WERE YOU MARRIED:       DO YOU LIVE IN A HOUSE, APARTMENT, ASSISTED LIVING, CONDO TRAILER ETC.:  House  IS IT ONE OR MORE STORIES: 1      HOW MANY PERSONS LIVE IN YOUR HOME: 3      DO YOU HAVE PETS IN YOUR HOME: No      CURRENT OR PAST PROFESSION: customer Service Rep      DO YOU EXERCISE: very little      WHAT TYPE AND HOW OFTEN:   Social Determinants of Health   Financial Resource Strain:   . Difficulty of Paying Living Expenses:   Food Insecurity:   . Worried About Charity fundraiser in the Last Year:   . Arboriculturist in the Last Year:   Transportation Needs:   . Film/video editor (Medical):   Marland Kitchen Lack of Transportation (Non-Medical):   Physical Activity:   . Days of Exercise per Week:   . Minutes of Exercise per Session:   Stress:   . Feeling of Stress :   Social Connections:   . Frequency of Communication with Friends and Family:   . Frequency of Social Gatherings with Friends and Family:   . Attends Religious Services:   . Active Member of Clubs or Organizations:   . Attends Archivist Meetings:   Marland Kitchen Marital Status:   Intimate Partner Violence:   . Fear  of Current or Ex-Partner:   . Emotionally Abused:   Marland Kitchen Physically Abused:   . Sexually Abused:     Outpatient Medications Prior to Visit  Medication Sig Dispense Refill  . albuterol (VENTOLIN HFA) 108 (90 Base) MCG/ACT inhaler Inhale 2 puffs into the lungs every 4 (four) hours as needed for wheezing or shortness of breath. 8 g 5  . Benralizumab (FASENRA) 30 MG/ML SOSY Inject 1 mL (30 mg total) into the skin every 8 (eight) weeks. 1 mL 5  . Benralizumab (FASENRA) 30 MG/ML SOSY Every 4 weeks x's 3 (Loading dose) 1 mL 2  . budesonide-formoterol (SYMBICORT) 160-4.5 MCG/ACT inhaler Inhale 2 puffs, then rinse mouth, twice daily 1 Inhaler 12  . EPINEPHrine 0.3 mg/0.3 mL IJ SOAJ injection Inject 0.3 mLs (0.3 mg total) into the muscle as needed for anaphylaxis. 2 each 5  . ferrous sulfate 325 (65 FE) MG EC tablet Take 1 tablet (325 mg total) by mouth daily. 90 tablet 1  . ipratropium-albuterol (DUONEB) 0.5-2.5 (3) MG/3ML SOLN INHALE 1 VIAL BY MOUTH VIA NEBULIZER EVERY 6 HOURS AS NEEDED 360 mL 12  . montelukast (SINGULAIR) 10 MG tablet TAKE 1 TABLET BY MOUTH EVERYDAY AT BEDTIME 90 tablet 1  . nicotine (NICODERM CQ - DOSED IN MG/24 HOURS) 21 mg/24hr patch Place 1 patch (21 mg total) onto the skin daily. 7 patch 0  . SAXENDA 18 MG/3ML SOPN INJECT 0.5 MLS (3 MG TOTAL) INTO THE SKIN DAILY. 3 mL 1  . SPIRIVA HANDIHALER 18 MCG inhalation capsule INHALE 1 CAPSULE VIA HANDIHALER ONCE DAILY AT THE SAME TIME EVERY DAY 30 capsule 3  . varenicline (CHANTIX CONTINUING MONTH PAK) 1 MG tablet Take 1 tablet (1 mg total) by mouth 2 (two) times daily. 60 tablet 3  . guaiFENesin (MUCINEX) 600 MG 12 hr tablet Take 600 mg by mouth as needed.      No facility-administered medications prior to visit.    Allergies  Allergen Reactions  . Penicillins     Childhood Allergy Did it involve swelling of the face/tongue/throat, SOB, or low BP? UNK Did it involve sudden or severe rash/hives, skin peeling, or any reaction on the  inside of your  mouth or nose? UNK Did you need to seek medical attention at a hospital or doctor's office? UNK When did it last happen?more than 10 years If all above answers are "NO", may proceed with cephalosporin use.     ROS Review of Systems  Constitutional: Negative.  Negative for fatigue.  HENT: Negative.   Eyes: Negative.   Respiratory: Negative.   Cardiovascular: Negative.   Gastrointestinal: Negative.   Endocrine: Negative.   Genitourinary: Negative.   Musculoskeletal: Positive for neck pain.       Right sided lymph node swelling  Skin: Negative.   Allergic/Immunologic: Positive for environmental allergies.       She has environmental and seasonal allergies  Neurological: Negative.   Hematological: Negative.       Objective:    Physical Exam  Constitutional: She is oriented to person, place, and time. She appears well-developed and well-nourished. No distress.  HENT:  Head: Normocephalic.  Neck:  Some swelling and pain present on palpitation to submental lymph node more on right than left  Cardiovascular: Normal rate, regular rhythm and normal heart sounds.  Pulmonary/Chest: Effort normal and breath sounds normal.  Musculoskeletal:        General: Normal range of motion.     Cervical back: Normal range of motion.  Lymphadenopathy:    She has cervical adenopathy.  Neurological: She is alert and oriented to person, place, and time.  Skin: Skin is warm and dry.  Psychiatric: She has a normal mood and affect. Her behavior is normal. Judgment and thought content normal.    BP 128/72 (BP Location: Left Wrist, Patient Position: Sitting, Cuff Size: Small)   Pulse (!) 107   Temp 98.4 F (36.9 C) (Oral)   Ht 5\' 2"  (1.575 m)   Wt (!) 351 lb 9.6 oz (159.5 kg)   BMI 64.31 kg/m  Wt Readings from Last 3 Encounters:  02/20/20 (!) 351 lb 9.6 oz (159.5 kg)  01/28/20 (!) 360 lb 12.8 oz (163.7 kg)  01/09/20 (!) 359 lb (162.8 kg)     Health Maintenance Due    Topic Date Due  . TETANUS/TDAP  12/20/2018  . PAP SMEAR-Modifier  12/28/2019    There are no preventive care reminders to display for this patient.  Lab Results  Component Value Date   TSH 0.470 11/21/2018   Lab Results  Component Value Date   WBC 11.5 (H) 10/19/2019   HGB 11.4 (L) 10/19/2019   HCT 39.9 10/19/2019   MCV 86.7 10/19/2019   PLT 334 10/19/2019   Lab Results  Component Value Date   NA 137 10/19/2019   K 4.8 10/19/2019   CO2 26 10/19/2019   GLUCOSE 80 10/19/2019   BUN 7 10/19/2019   CREATININE 0.84 10/19/2019   BILITOT 0.9 10/19/2019   ALKPHOS 72 10/19/2019   AST 33 10/19/2019   ALT 16 10/19/2019   PROT 7.3 10/19/2019   ALBUMIN 3.8 10/19/2019   CALCIUM 8.6 (L) 10/19/2019   ANIONGAP 9 10/19/2019   GFR 104.19 04/04/2019   Lab Results  Component Value Date   CHOL 104 03/23/2017   Lab Results  Component Value Date   HDL 63 03/23/2017   Lab Results  Component Value Date   LDLCALC 29 03/23/2017   Lab Results  Component Value Date   TRIG 58 03/23/2017   Lab Results  Component Value Date   CHOLHDL 1.7 03/23/2017   Lab Results  Component Value Date   HGBA1C 5.1 07/11/2019  Assessment & Plan:   1. Swollen lymph nodes  Mild swelling to submandibular lymph nodes with mild tenderness on right  Encourage  - fluticasone (FLONASE SENSIMIST) 27.5 MCG/SPRAY nasal spray; Place 2 sprays into the nose daily.  Dispense: 10 g; Refill: 12  2. Seasonal allergies  Continue with antihistamines and add a steroid nasal spray  3. Class 3 severe obesity due to excess calories without serious comorbidity with body mass index (BMI) of 60.0 to 69.9 in adult [facility])  Chronic, encouraged to continue with healthy diet and regular exercise  Congratulated patient on her 9 lb weight loss  Continue with Saxenda     Marylu Lund, RN

## 2020-02-20 NOTE — Patient Instructions (Signed)
   CONGRATULATIONS ON YOUR 9 LB WEIGHT LOSS.   CONTINUE WITH REGULAR ACTIVITY AND HEALTHY EATING.

## 2020-02-21 ENCOUNTER — Telehealth: Payer: Self-pay | Admitting: Pharmacist

## 2020-02-21 NOTE — Patient Instructions (Addendum)
DUE TO COVID-19 ONLY ONE VISITOR IS ALLOWED TO COME WITH YOU AND STAY IN THE WAITING ROOM ONLY DURING PRE OP AND PROCEDURE DAY OF SURGERY. THE 1 VISITOR MAY VISIT WITH YOU AFTER SURGERY IN YOUR PRIVATE ROOM DURING VISITING HOURS ONLY!  YOU NEED TO HAVE A COVID 19 TEST ON: 03/01/20 @ 9:20 am , THIS TEST MUST BE DONE BEFORE SURGERY, COME  [address] , [id number].  ([place]) ONCE YOUR COVID TEST IS COMPLETED, PLEASE BEGIN THE QUARANTINE INSTRUCTIONS AS OUTLINED IN YOUR HANDOUT.                JENNE HAIST    Your procedure is scheduled on: 03/05/20  Report to [facility] Main  Entrance   Report to admitting at: 11:15 AM     Call this number if you have problems the morning of surgery [phone]    Remember: Do not eat solid food :After Midnight. Clear liquid diet from midnight until: 10:00 am     CLEAR LIQUID DIET   Foods Allowed                                                                     Foods Excluded  Coffee and tea, regular and decaf                             liquids that you cannot  Plain Jell-O any favor except red or purple                                           see through such as: Fruit ices (not with fruit pulp)                                     milk, soups, orange juice  Iced Popsicles                                    All solid food Carbonated beverages, regular and diet                                    Cranberry, grape and apple juices Sports drinks like Gatorade Lightly seasoned clear broth or consume(fat free) Sugar, honey syrup  Sample Menu Breakfast                                Lunch                                     Supper Cranberry juice                    Beef broth  Chicken broth Jell-O                                     Grape juice                           Apple juice Coffee or tea                        Jell-O                                      Popsicle                                                 Coffee or tea                        Coffee or tea  _____________________________________________________________________  BRUSH YOUR TEETH MORNING OF SURGERY AND RINSE YOUR MOUTH OUT, NO CHEWING GUM CANDY OR MINTS.    Use inhalers and Flonase as usual.Mucinex as needed.                                 You may not have any metal on your body including hair pins and              piercings  Do not wear jewelry, make-up, lotions, powders or perfumes, deodorant             Do not wear nail polish on your fingernails.  Do not shave  48 hours prior to surgery.                Do not bring valuables to the hospital. [place].  Contacts, dentures or bridgework may not be worn into surgery.  Leave suitcase in the car. After surgery it may be brought to your room.     Patients discharged the day of surgery will not be allowed to drive home. IF YOU ARE HAVING SURGERY AND GOING HOME THE SAME DAY, YOU MUST HAVE AN ADULT TO DRIVE YOU HOME AND BE WITH YOU FOR 24 HOURS. YOU MAY GO HOME BY TAXI OR UBER OR ORTHERWISE, BUT AN ADULT MUST ACCOMPANY YOU HOME AND STAY WITH YOU FOR 24 HOURS.  Name and phone number of your driver:  Special Instructions: N/A              Please read over the following fact sheets you were given: _____________________________________________________________________             [facility] - Preparing for Surgery Before surgery, you can play an important role.  Because skin is not sterile, your skin needs to be as free of germs as possible.  You can reduce the number of germs on your skin by washing with CHG (chlorahexidine gluconate) soap before surgery.  CHG is an antiseptic cleaner which kills germs and bonds with the skin to continue killing germs even after washing. Please  DO NOT use if you have an allergy to CHG or antibacterial soaps.  If your skin becomes reddened/irritated stop  using the CHG and inform your nurse when you arrive at Short Stay. Do not shave (including legs and underarms) for at least 48 hours prior to the first CHG shower.  You may shave your face/neck. Please follow these instructions carefully:  1.  Shower with CHG Soap the night before surgery and the  morning of Surgery.  2.  If you choose to wash your hair, wash your hair first as usual with your  normal  shampoo.  3.  After you shampoo, rinse your hair and body thoroughly to remove the  shampoo.                           4.  Use CHG as you would any other liquid soap.  You can apply chg directly  to the skin and wash                       Gently with a scrungie or clean washcloth.  5.  Apply the CHG Soap to your body ONLY FROM THE NECK DOWN.   Do not use on face/ open                           Wound or open sores. Avoid contact with eyes, ears mouth and genitals (private parts).                       Wash face,  Genitals (private parts) with your normal soap.             6.  Wash thoroughly, paying special attention to the area where your surgery  will be performed.  7.  Thoroughly rinse your body with warm water from the neck down.  8.  DO NOT shower/wash with your normal soap after using and rinsing off  the CHG Soap.                9.  Pat yourself dry with a clean towel.            10.  Wear clean pajamas.            11.  Place clean sheets on your bed the night of your first shower and do not  sleep with pets. Day of Surgery : Do not apply any lotions/deodorants the morning of surgery.  Please wear clean clothes to the hospital/surgery center.  FAILURE TO FOLLOW THESE INSTRUCTIONS MAY RESULT IN THE CANCELLATION OF YOUR SURGERY PATIENT SIGNATURE_________________________________  NURSE SIGNATURE__________________________________  ________________________________________________________________________

## 2020-02-21 NOTE — Telephone Encounter (Signed)
Attempted to call patient to follow up on smoking cessation and no answer.  Sent MyChart message.   Mariella Saa, PharmD, [place], CPP Clinical Specialty Pharmacist (Rheumatology and Pulmonology)  02/21/2020 2:38 PM

## 2020-02-21 NOTE — H&P (Signed)
NAME: April Ellis, Blough MEDICAL RECORD [id number] ACCOUNT [id number] DATE OF BIRTH:11/08/1975 FACILITY: [facility] LOCATION:  PHYSICIAN:Lua Feng Garry Heater, MD  HISTORY AND PHYSICAL  DATE OF ADMISSION:  03/05/2020  Scheduled surgery is the [facility], 03/05/2020.  CHIEF COMPLAINT:  Menorrhagia/anemia.  HISTORY OF PRESENT ILLNESS:  A 44 year old single G1 P1, patient states that she is bisexual, has a fairly long history of having heavy irregular periods. At a prior practice, she had ultrasounds done.  She states all of those were normal, but no  specific treatment such as D and C, hysteroscopy, ablation, Mirena IUD, or hysterectomy was offered.  She has not had a prior SHG.  Sonohysterogram was carried out in our office 01/28/2020.  That showed a simple cyst on the right ovary, 2.9 x 1.9.  Left ovary negative.  No free fluid noted.  There did appear to be a density within the endometrium, probable polyp on saline infusion.   She presents at this time for hysteroscopy with D and C, MyoSure resection of a probable polyp and NovaSure ablation.  We had discussed other options, including a Mirena IUD and hysterectomy, which she declined.  This procedure, including the 90%  hypermenorrhea rate reviewed, specific risks regarding other surgery discussed with her, which she understands and accepts.  When she was originally in our office, her fingerstick hemoglobin was 8.5.  She was started on oral iron at that time pending  upcoming surgery.  She has also been on a taper down of OCPs to try down regulate her bleeding in preparation for hysteroscopy.  ALLERGIES:  None.  CURRENT MEDICATIONS:  Albuterol inhaler, cetirizine 10 mg daily, Chantix to help with smoking cessation, ferrous sulfate, ____ which is an oral contraceptive, montelukast 10 mg once daily, nicotine patch, Saxenda, Spiriva, Symbicort, Xolair.  PAST MEDICAL HISTORY:  Of note, she has not had a mammogram in the past.  Her last Pap was  2020; that was normal.  She is a G1 P1.  She had a vaginal delivery in 2005.  FAMILY HISTORY:  Significant for mother, sister and father with hypertension, 2 sisters and her mother with diabetes.  Mother had colon cancer and renal failure.  Sister has had fibroids.  SOCIAL HISTORY:  She is smoking less than one-half PPD.  She is on Chantix and nicotine, trying to stop.  Occasional alcohol use, marijuana.  PHYSICAL EXAMINATION: VITAL SIGNS:  Temperature 98.2, blood pressure 132/80, BMI of 65.6 with her weight of 355. HEENT:  Unremarkable. NECK:  Supple, without masses. LUNGS:  Clear. CARDIOVASCULAR:  Regular rate and rhythm without murmurs, rubs or gallops. BREASTS:  Without masses, tenderness, or nipple discharge. ABDOMEN:  Soft, flat, nontender.  Exam difficult due to her weight. PELVIC:  Vulva, vagina, cervix normal.  Uterus midposition, upper limit of normal size.  Adnexa negative.  Again, exam difficult due to her weight. EXTREMITIES AND NEUROLOGIC:  Unremarkable.  IMPRESSION:  A 44 year old with significant menorrhagia, felt to be secondary to endometrial polyp.  She does not want to be pregnant again under any circumstance.  She understands ablation does not equal sterilization.  She presents at this time for  dilatation and curettage, hysteroscopy with MyoSure resection of a polyp and NovaSure endometrial ablation.  Procedure and risks discussed as above in the history of present illness.  VN/NUANCE  D:02/12/2020 T:02/12/2020 JOB:[id number]

## 2020-02-22 ENCOUNTER — Encounter (HOSPITAL_COMMUNITY)
Admission: RE | Admit: 2020-02-22 | Discharge: 2020-02-22 | Disposition: A | Discharge status: Home / Self Care | Payer: BC Managed Care – PPO | Source: Ambulatory Visit | Attending: Obstetrics and Gynecology | Admitting: Obstetrics and Gynecology

## 2020-02-22 ENCOUNTER — Encounter (HOSPITAL_COMMUNITY): Payer: Self-pay

## 2020-02-22 ENCOUNTER — Other Ambulatory Visit: Payer: Self-pay

## 2020-02-22 DIAGNOSIS — Z01818 Encounter for other preprocedural examination: Secondary | ICD-10-CM | POA: Diagnosis not present

## 2020-02-22 HISTORY — DX: Dyspnea, unspecified: R06.00

## 2020-02-22 NOTE — Progress Notes (Signed)
COVID Vaccine Completed:yes Date COVID Vaccine completed: COVID vaccine manufacturer: *[place]   PCP - [name].FNP LOV: 02/20/20 Cardiologist -   Chest x-ray -  EKG - 10/22/19 Stress Test -  ECHO -  Cardiac Cath -   Sleep Study - yes CPAP - no  Fasting Blood Sugar -  Checks Blood Sugar _____ times a day  Blood Thinner Instructions: Aspirin Instructions: Last Dose:  Anesthesia review:   Patient denies shortness of breath, fever, cough and chest pain at PAT appointment   Patient verbalized understanding of instructions that were given to them at the PAT appointment. Patient was also instructed that they will need to review over the PAT instructions again at home before surgery.

## 2020-02-27 ENCOUNTER — Other Ambulatory Visit: Payer: Self-pay

## 2020-02-27 ENCOUNTER — Encounter (HOSPITAL_COMMUNITY)
Admission: RE | Admit: 2020-02-27 | Discharge: 2020-02-27 | Disposition: A | Discharge status: Home / Self Care | Payer: BC Managed Care – PPO | Source: Ambulatory Visit | Attending: Obstetrics and Gynecology | Admitting: Obstetrics and Gynecology

## 2020-02-27 DIAGNOSIS — Z01812 Encounter for preprocedural laboratory examination: Secondary | ICD-10-CM | POA: Insufficient documentation

## 2020-02-27 LAB — CBC
HCT: 28.4 % — ABNORMAL LOW (ref 36.0–46.0)
Hemoglobin: 7.6 g/dL — ABNORMAL LOW (ref 12.0–15.0)
MCH: 20.3 pg — ABNORMAL LOW (ref 26.0–34.0)
MCHC: 26.8 g/dL — ABNORMAL LOW (ref 30.0–36.0)
MCV: 75.7 fL — ABNORMAL LOW (ref 80.0–100.0)
Platelets: 479 10*3/uL — ABNORMAL HIGH (ref 150–400)
RBC: 3.75 MIL/uL — ABNORMAL LOW (ref 3.87–5.11)
RDW: 20.1 % — ABNORMAL HIGH (ref 11.5–15.5)
WBC: 11.2 10*3/uL — ABNORMAL HIGH (ref 4.0–10.5)
nRBC: 0 % (ref 0.0–0.2)

## 2020-02-27 LAB — ABO/RH: ABO/RH(D): O POS

## 2020-02-27 NOTE — Progress Notes (Signed)
Lab results: Hemoglobin: 7.6

## 2020-02-28 ENCOUNTER — Ambulatory Visit: Payer: BC Managed Care – PPO

## 2020-02-28 DIAGNOSIS — N920 Excessive and frequent menstruation with regular cycle: Secondary | ICD-10-CM | POA: Diagnosis not present

## 2020-02-28 DIAGNOSIS — D649 Anemia, unspecified: Secondary | ICD-10-CM | POA: Diagnosis not present

## 2020-03-01 ENCOUNTER — Other Ambulatory Visit (HOSPITAL_COMMUNITY)
Admission: RE | Admit: 2020-03-01 | Discharge: 2020-03-01 | Disposition: A | Discharge status: Home / Self Care | Payer: BC Managed Care – PPO | Source: Ambulatory Visit | Attending: Obstetrics and Gynecology | Admitting: Obstetrics and Gynecology

## 2020-03-01 DIAGNOSIS — Z01812 Encounter for preprocedural laboratory examination: Secondary | ICD-10-CM | POA: Diagnosis not present

## 2020-03-01 DIAGNOSIS — Z20822 Contact with and (suspected) exposure to covid-19: Secondary | ICD-10-CM | POA: Diagnosis not present

## 2020-03-01 LAB — SARS CORONAVIRUS 2 (TAT 6-24 HRS): SARS Coronavirus 2: NEGATIVE

## 2020-03-03 ENCOUNTER — Ambulatory Visit: Payer: BC Managed Care – PPO

## 2020-03-05 ENCOUNTER — Encounter (HOSPITAL_COMMUNITY): Payer: Self-pay | Admitting: Obstetrics and Gynecology

## 2020-03-05 ENCOUNTER — Ambulatory Visit (HOSPITAL_COMMUNITY): Payer: BC Managed Care – PPO | Admitting: Anesthesiology

## 2020-03-05 ENCOUNTER — Ambulatory Visit (HOSPITAL_COMMUNITY)
Admission: RE | Admit: 2020-03-05 | Discharge: 2020-03-05 | Disposition: A | Discharge status: Home / Self Care | Payer: BC Managed Care – PPO | Source: Ambulatory Visit | Attending: Obstetrics and Gynecology | Admitting: Obstetrics and Gynecology

## 2020-03-05 ENCOUNTER — Encounter (HOSPITAL_COMMUNITY)
Admission: RE | Disposition: A | Discharge status: Home / Self Care | Payer: Self-pay | Source: Ambulatory Visit | Attending: Obstetrics and Gynecology

## 2020-03-05 ENCOUNTER — Other Ambulatory Visit: Payer: Self-pay

## 2020-03-05 DIAGNOSIS — Z8249 Family history of ischemic heart disease and other diseases of the circulatory system: Secondary | ICD-10-CM | POA: Diagnosis not present

## 2020-03-05 DIAGNOSIS — D649 Anemia, unspecified: Secondary | ICD-10-CM | POA: Insufficient documentation

## 2020-03-05 DIAGNOSIS — Z7951 Long term (current) use of inhaled steroids: Secondary | ICD-10-CM | POA: Diagnosis not present

## 2020-03-05 DIAGNOSIS — Z6841 Body Mass Index (BMI) 40.0 and over, adult: Secondary | ICD-10-CM | POA: Diagnosis not present

## 2020-03-05 DIAGNOSIS — F17210 Nicotine dependence, cigarettes, uncomplicated: Secondary | ICD-10-CM | POA: Insufficient documentation

## 2020-03-05 DIAGNOSIS — Z79899 Other long term (current) drug therapy: Secondary | ICD-10-CM | POA: Diagnosis not present

## 2020-03-05 DIAGNOSIS — D72829 Elevated white blood cell count, unspecified: Secondary | ICD-10-CM | POA: Diagnosis not present

## 2020-03-05 DIAGNOSIS — J449 Chronic obstructive pulmonary disease, unspecified: Secondary | ICD-10-CM | POA: Diagnosis not present

## 2020-03-05 DIAGNOSIS — N921 Excessive and frequent menstruation with irregular cycle: Secondary | ICD-10-CM | POA: Insufficient documentation

## 2020-03-05 DIAGNOSIS — I10 Essential (primary) hypertension: Secondary | ICD-10-CM | POA: Insufficient documentation

## 2020-03-05 DIAGNOSIS — D500 Iron deficiency anemia secondary to blood loss (chronic): Secondary | ICD-10-CM | POA: Diagnosis not present

## 2020-03-05 DIAGNOSIS — N939 Abnormal uterine and vaginal bleeding, unspecified: Secondary | ICD-10-CM | POA: Diagnosis not present

## 2020-03-05 DIAGNOSIS — N920 Excessive and frequent menstruation with regular cycle: Secondary | ICD-10-CM | POA: Diagnosis not present

## 2020-03-05 HISTORY — PX: DILITATION & CURRETTAGE/HYSTROSCOPY WITH NOVASURE ABLATION: SHX5568

## 2020-03-05 LAB — TYPE AND SCREEN
ABO/RH(D): O POS
Antibody Screen: NEGATIVE

## 2020-03-05 LAB — HEMOGLOBIN AND HEMATOCRIT, BLOOD
HCT: 27.5 % — ABNORMAL LOW (ref 36.0–46.0)
Hemoglobin: 7.3 g/dL — ABNORMAL LOW (ref 12.0–15.0)

## 2020-03-05 LAB — PREGNANCY, URINE: Preg Test, Ur: NEGATIVE

## 2020-03-05 SURGERY — DILATATION & CURETTAGE/HYSTEROSCOPY WITH NOVASURE ABLATION
Anesthesia: General

## 2020-03-05 MED ORDER — SUCCINYLCHOLINE CHLORIDE 200 MG/10ML IV SOSY
PREFILLED_SYRINGE | INTRAVENOUS | Status: DC | PRN
  Administered 2020-03-05: 200 mg via INTRAVENOUS

## 2020-03-05 MED ORDER — CHLORHEXIDINE GLUCONATE 0.12 % MT SOLN
15.0000 mL | Freq: Once | OROMUCOSAL | Status: AC
  Administered 2020-03-05: 15 mL via OROMUCOSAL

## 2020-03-05 MED ORDER — ORAL CARE MOUTH RINSE: 15.0000 mL | Freq: Once | OROMUCOSAL | Status: AC

## 2020-03-05 MED ORDER — ONDANSETRON HCL 4 MG/2ML IJ SOLN
INTRAMUSCULAR | Status: DC | PRN
  Administered 2020-03-05: 4 mg via INTRAVENOUS

## 2020-03-05 MED ORDER — FENTANYL CITRATE (PF) 250 MCG/5ML IJ SOLN
INTRAMUSCULAR | Status: AC
  Filled 2020-03-05: qty 5

## 2020-03-05 MED ORDER — LIDOCAINE HCL 1 % IJ SOLN
INTRAMUSCULAR | Status: DC | PRN
  Administered 2020-03-05: 9 mL

## 2020-03-05 MED ORDER — MIDAZOLAM HCL 2 MG/2ML IJ SOLN
INTRAMUSCULAR | Status: DC | PRN
  Administered 2020-03-05: 2 mg via INTRAVENOUS

## 2020-03-05 MED ORDER — SODIUM CHLORIDE 0.9 % IV SOLN
2.0000 g | INTRAVENOUS | Status: AC
  Administered 2020-03-05: 2 g via INTRAVENOUS
  Filled 2020-03-05: qty 2

## 2020-03-05 MED ORDER — SILVER NITRATE-POT NITRATE 75-25 % EX MISC
CUTANEOUS | Status: AC
  Filled 2020-03-05: qty 10

## 2020-03-05 MED ORDER — PROPOFOL 10 MG/ML IV BOLUS
INTRAVENOUS | Status: AC
  Filled 2020-03-05: qty 20

## 2020-03-05 MED ORDER — MIDAZOLAM HCL 2 MG/2ML IJ SOLN
INTRAMUSCULAR | Status: AC
  Filled 2020-03-05: qty 2

## 2020-03-05 MED ORDER — DEXAMETHASONE SODIUM PHOSPHATE 10 MG/ML IJ SOLN
INTRAMUSCULAR | Status: DC | PRN
  Administered 2020-03-05: 10 mg via INTRAVENOUS

## 2020-03-05 MED ORDER — LIDOCAINE 2% (20 MG/ML) 5 ML SYRINGE
INTRAMUSCULAR | Status: DC | PRN
  Administered 2020-03-05: 100 mg via INTRAVENOUS

## 2020-03-05 MED ORDER — LIDOCAINE HCL (PF) 1 % IJ SOLN
INTRAMUSCULAR | Status: AC
  Filled 2020-03-05: qty 30

## 2020-03-05 MED ORDER — FENTANYL CITRATE (PF) 250 MCG/5ML IJ SOLN
INTRAMUSCULAR | Status: DC | PRN
  Administered 2020-03-05 (×2): 50 ug via INTRAVENOUS

## 2020-03-05 MED ORDER — PROPOFOL 10 MG/ML IV BOLUS
INTRAVENOUS | Status: DC | PRN
  Administered 2020-03-05: 260 mg via INTRAVENOUS

## 2020-03-05 MED ORDER — LACTATED RINGERS IV SOLN: INTRAVENOUS | Status: DC

## 2020-03-05 MED ORDER — FENTANYL CITRATE (PF) 100 MCG/2ML IJ SOLN: 25.0000 ug | INTRAMUSCULAR | Status: DC | PRN

## 2020-03-05 MED ORDER — ACETAMINOPHEN 500 MG PO TABS
1000.0000 mg | ORAL_TABLET | Freq: Once | ORAL | Status: AC
  Administered 2020-03-05: 1000 mg via ORAL
  Filled 2020-03-05: qty 2

## 2020-03-05 MED ORDER — OXYCODONE-ACETAMINOPHEN 5-325 MG PO TABS: 1.0000 | ORAL_TABLET | Freq: Four times a day (QID) | ORAL | Status: DC | PRN

## 2020-03-05 SURGICAL SUPPLY — 34 items
ABLATOR SURESOUND NOVASURE (ABLATOR) ×3 IMPLANT
BIPOLAR CUTTING LOOP 21FR (ELECTRODE)
CANISTER SUCT 3000ML PPV (MISCELLANEOUS) ×3 IMPLANT
CATH ROBINSON RED A/P 16FR (CATHETERS) IMPLANT
CLOTH BEACON ORANGE TIMEOUT ST (SAFETY) ×3 IMPLANT
COUNTER NEEDLE 20 DBL MAG RED (NEEDLE) ×3 IMPLANT
COVER BACK TABLE 60X90IN (DRAPES) ×3 IMPLANT
COVER WAND RF STERILE (DRAPES) ×3 IMPLANT
DILATOR CANAL MILEX (MISCELLANEOUS) IMPLANT
DRAPE HYSTEROSCOPY (DRAPE) ×3 IMPLANT
DRAPE SHEET LG 3/4 BI-LAMINATE (DRAPES) ×3 IMPLANT
DRSG TELFA 3X8 NADH (GAUZE/BANDAGES/DRESSINGS) ×3 IMPLANT
ELECT REM PT RETURN 15FT ADLT (MISCELLANEOUS) ×3 IMPLANT
GAUZE PETROLATUM 1 X8 (GAUZE/BANDAGES/DRESSINGS) IMPLANT
GAUZE VASELINE 3X9 (GAUZE/BANDAGES/DRESSINGS) IMPLANT
GLOVE BIO SURGEON STRL SZ7 (GLOVE) ×6 IMPLANT
GOWN STRL REUS W/ TWL LRG LVL3 (GOWN DISPOSABLE) ×1 IMPLANT
GOWN STRL REUS W/ TWL XL LVL3 (GOWN DISPOSABLE) ×1 IMPLANT
GOWN STRL REUS W/TWL LRG LVL3 (GOWN DISPOSABLE) ×2
GOWN STRL REUS W/TWL XL LVL3 (GOWN DISPOSABLE) ×2
KIT BASIN (CUSTOM PROCEDURE TRAY) ×3 IMPLANT
KIT PROCEDURE FLUENT (KITS) ×3 IMPLANT
KIT TURNOVER CYSTO (KITS) ×3 IMPLANT
LEGGING LITHOTOMY PAIR STRL (DRAPES) ×3 IMPLANT
LOOP CUTTING BIPOLAR 21FR (ELECTRODE) IMPLANT
MANIFOLD NEPTUNE II (INSTRUMENTS) IMPLANT
NEEDLE SPNL 22GX3.5 QUINCKE BK (NEEDLE) ×3 IMPLANT
PAD OB MATERNITY 4.3X12.25 (PERSONAL CARE ITEMS) ×3 IMPLANT
PAD PREP 24X48 CUFFED NSTRL (MISCELLANEOUS) ×3 IMPLANT
SYR CONTROL 10ML LL (SYRINGE) ×3 IMPLANT
TOWEL OR 17X26 10 PK STRL BLUE (TOWEL DISPOSABLE) ×6 IMPLANT
TUBING CONNECTING 10 (TUBING) IMPLANT
TUBING CONNECTING 10' (TUBING)
WATER STERILE IRR 500ML POUR (IV SOLUTION) ×3 IMPLANT

## 2020-03-05 NOTE — Discharge Instructions (Signed)
General Anesthesia, Adult, Care After This sheet gives you information about how to care for yourself after your procedure. Your health care provider may also give you more specific instructions. If you have problems or questions, contact your health care provider. What can I expect after the procedure? After the procedure, the following side effects are common:  Pain or discomfort at the IV site.  Nausea.  Vomiting.  Sore throat.  Trouble concentrating.  Feeling cold or chills.  Weak or tired.  Sleepiness and fatigue.  Soreness and body aches. These side effects can affect parts of the body that were not involved in surgery. Follow these instructions at home:  For at least 24 hours after the procedure:  Have a responsible adult stay with you. It is important to have someone help care for you until you are awake and alert.  Rest as needed.  Do not: ? Participate in activities in which you could fall or become injured. ? Drive. ? Use heavy machinery. ? Drink alcohol. ? Take sleeping pills or medicines that cause drowsiness. ? Make important decisions or sign legal documents. ? Take care of children on your own. Eating and drinking  Follow any instructions from your health care provider about eating or drinking restrictions.  When you feel hungry, start by eating small amounts of foods that are soft and easy to digest (bland), such as toast. Gradually return to your regular diet.  Drink enough fluid to keep your urine pale yellow.  If you vomit, rehydrate by drinking water, juice, or clear broth. General instructions  If you have sleep apnea, surgery and certain medicines can increase your risk for breathing problems. Follow instructions from your health care provider about wearing your sleep device: ? Anytime you are sleeping, including during daytime naps. ? While taking prescription pain medicines, sleeping medicines, or medicines that make you drowsy.  Return to  your normal activities as told by your health care provider. Ask your health care provider what activities are safe for you.  Take over-the-counter and prescription medicines only as told by your health care provider.  If you smoke, do not smoke without supervision.  Keep all follow-up visits as told by your health care provider. This is important. Contact a health care provider if:  You have nausea or vomiting that does not get better with medicine.  You cannot eat or drink without vomiting.  You have pain that does not get better with medicine.  You are unable to pass urine.  You develop a skin rash.  You have a fever.  You have redness around your IV site that gets worse. Get help right away if:  You have difficulty breathing.  You have chest pain.  You have blood in your urine or stool, or you vomit blood. Summary  After the procedure, it is common to have a sore throat or nausea. It is also common to feel tired.  Have a responsible adult stay with you for the first 24 hours after general anesthesia. It is important to have someone help care for you until you are awake and alert.  When you feel hungry, start by eating small amounts of foods that are soft and easy to digest (bland), such as toast. Gradually return to your regular diet.  Drink enough fluid to keep your urine pale yellow.  Return to your normal activities as told by your health care provider. Ask your health care provider what activities are safe for you. This information is not   intended to replace advice given to you by your health care provider. Make sure you discuss any questions you have with your health care provider. Document Revised: 09/16/2017 Document Reviewed: 04/29/2017 Elsevier Patient Education  2020 [employer]  

## 2020-03-05 NOTE — Anesthesia Preprocedure Evaluation (Addendum)
Anesthesia Evaluation  Patient identified by MRN, date of birth, ID band Patient awake    Reviewed: Allergy & Precautions, NPO status , Patient's Chart, lab work & pertinent test results  Airway Mallampati: II  TM Distance: <3 FB Neck ROM: Full    Dental no notable dental hx.    Pulmonary asthma , COPD,  COPD inhaler, Current Smoker and Patient abstained from smoking.,    Pulmonary exam normal breath sounds clear to auscultation + decreased breath sounds      Cardiovascular hypertension, Normal cardiovascular exam Rhythm:Regular Rate:Normal  Untreated HTN   Neuro/Psych negative neurological ROS  negative psych ROS   GI/Hepatic negative GI ROS, Neg liver ROS,   Endo/Other  Morbid obesity  Renal/GU negative Renal ROS  negative genitourinary   Musculoskeletal negative musculoskeletal ROS (+)   Abdominal (+) + obese,   Peds negative pediatric ROS (+)  Hematology negative hematology ROS (+)   Anesthesia Other Findings   Reproductive/Obstetrics negative OB ROS                            Anesthesia Physical Anesthesia Plan  ASA: III  Anesthesia Plan: General   Post-op Pain Management:    Induction: Intravenous  PONV Risk Score and Plan: 2 and Ondansetron, Dexamethasone and Treatment may vary due to age or medical condition  Airway Management Planned: Oral ETT  Additional Equipment:   Intra-op Plan:   Post-operative Plan: Extubation in OR  Informed Consent: I have reviewed the patients History and Physical, chart, labs and discussed the procedure including the risks, benefits and alternatives for the proposed anesthesia with the patient or authorized representative who has indicated his/her understanding and acceptance.     Dental advisory given  Plan Discussed with: CRNA and Surgeon  Anesthesia Plan Comments:        Anesthesia Quick Evaluation

## 2020-03-05 NOTE — Progress Notes (Signed)
Pt discharged in NAD, VSS, pain tolerable. Pt given discharge instructions, all questions answered. Pt discharged home with sister.

## 2020-03-05 NOTE — Progress Notes (Signed)
The patient was re-examined with no change in status, Pre oop Hgb 7.3

## 2020-03-05 NOTE — Anesthesia Procedure Notes (Addendum)
Date/Time: 03/05/2020 1:45 PM Performed by: Cynda Familia, CRNA Oxygen Delivery Method: Simple face mask Placement Confirmation: positive ETCO2 and breath sounds checked- equal and bilateral Dental Injury: Teeth and Oropharynx as per pre-operative assessment

## 2020-03-05 NOTE — Anesthesia Procedure Notes (Signed)
Procedure Name: Intubation Date/Time: 03/05/2020 1:08 PM Performed by: Cynda Familia, CRNA Pre-anesthesia Checklist: Patient identified, Emergency Drugs available, Suction available and Patient being monitored Patient Re-evaluated:Patient Re-evaluated prior to induction Oxygen Delivery Method: Circle System Utilized Preoxygenation: Pre-oxygenation with 100% oxygen Induction Type: IV induction Ventilation: Mask ventilation without difficulty Laryngoscope Size: Miller and 2 Grade View: Grade II Tube type: Oral Tube size: 7.0 mm Number of attempts: 1 Airway Equipment and Method: Stylet Placement Confirmation: ETT inserted through vocal cords under direct vision,  positive ETCO2 and breath sounds checked- equal and bilateral Secured at: 21 cm Tube secured with: Tape Dental Injury: Teeth and Oropharynx as per pre-operative assessment  Comments: Smooth Iv induction rose-- intubation AM CRNA atraumatic-- teeth and mouth as preop -- chipping present esp front teeth- pt did report- unchanged with laryngoscopy -- bilat BS Rose

## 2020-03-05 NOTE — Anesthesia Postprocedure Evaluation (Signed)
Anesthesia Post Note  Patient: April Ellis  Procedure(s) Performed: DILATATION & CURETTAGE/HYSTEROSCOPY WITH NOVASURE ABLATION (N/A )     Patient location during evaluation: PACU Anesthesia Type: General Level of consciousness: awake and alert Pain management: pain level controlled Vital Signs Assessment: post-procedure vital signs reviewed and stable Respiratory status: spontaneous breathing, nonlabored ventilation, respiratory function stable and patient connected to nasal cannula oxygen Cardiovascular status: blood pressure returned to baseline and stable Postop Assessment: no apparent nausea or vomiting Anesthetic complications: no    Last Vitals:  Vitals:   03/05/20 1445 03/05/20 1500  BP: (!) 95/53 115/89  Pulse: 73 68  Resp: 16 16  Temp:    SpO2: 96% 98%    Last Pain:  Vitals:   03/05/20 1500  TempSrc:   PainSc: 4                  Ceanna Wareing S

## 2020-03-05 NOTE — Op Note (Signed)
Preoperative diagnosis: Menorrhagia with severe anemia, possible endometrial polyp  Postoperative diagnosis: Same  Procedure: D&C hysteroscopy, NovaSure endometrial ablation  Surgeon: Matthew Saras  Anesthesia: General  EBL: Less than 10 cc  Procedure and findings:  The patient was taken to the operating room after an adequate level of general anesthesia was obtained with the patient's legs in stirrups on the oversized table, appropriate timeouts were taken.  Betadine prep in the vagina the bladder was drained.  Speculum was positioned cervix grasped with a tenaculum, paracervical block created by infiltrating at 3 and 9:00 submucosally 5 to 7 cc of 1% plain Xylocaine at each site.  The uterus was then sounded to 9 cm, progressively dilated to a 27 Pratt dilator.  Once this was accomplished the continuous-flow hysteroscope was inserted, good view of the cavity, there was some general endometrial buildup but no isolated polyp noted.  Scope was removed, sharp curettage was carried out and sent as and sent as endometrial curettings.  Cervical length and fundal length were rechecked, the NovaSure device was then positioned per those measurements, cavity width of 3.5 fundal length of 6.0.  Passed the CO2 test, a treatment cycle oh 1 minute 56 seconds.  There was a minimal fluid shortage per tper the hysteroscopy portion of the procedure.  She tolerated this well went to recovery room in good condition.  Dictated with Dragon Medical 1  Margarette Asal MD

## 2020-03-05 NOTE — Transfer of Care (Signed)
Immediate Anesthesia Transfer of Care Note  Patient: April Ellis  Procedure(s) Performed: DILATATION & CURETTAGE/HYSTEROSCOPY WITH NOVASURE ABLATION (N/A )  Patient Location: PACU  Anesthesia Type:General  Level of Consciousness: awake, alert  and oriented  Airway & Oxygen Therapy: Patient Spontanous Breathing and Patient connected to face mask oxygen  Post-op Assessment: Report given to RN and Post -op Vital signs reviewed and stable  Post vital signs: Reviewed and stable  Last Vitals:  Vitals Value Taken Time  BP 132/68 03/05/20 1351  Temp 36.5 C 03/05/20 1351  Pulse 77 03/05/20 1357  Resp 16 03/05/20 1357  SpO2 100 % 03/05/20 1357  Vitals shown include unvalidated device data.  Last Pain:  Vitals:   03/05/20 1131  TempSrc:   PainSc: 0-No pain      Patients Stated Pain Goal: 4 (50/41/36 [id number])  Complications: No apparent anesthesia complications

## 2020-03-06 ENCOUNTER — Encounter (HOSPITAL_COMMUNITY): Payer: Self-pay | Admitting: Obstetrics and Gynecology

## 2020-03-06 LAB — SURGICAL PATHOLOGY

## 2020-03-09 ENCOUNTER — Other Ambulatory Visit: Payer: Self-pay | Admitting: Internal Medicine

## 2020-03-12 DIAGNOSIS — D649 Anemia, unspecified: Secondary | ICD-10-CM | POA: Diagnosis not present

## 2020-03-13 ENCOUNTER — Other Ambulatory Visit: Payer: Self-pay

## 2020-03-13 ENCOUNTER — Encounter (HOSPITAL_COMMUNITY): Payer: Self-pay | Admitting: Emergency Medicine

## 2020-03-13 ENCOUNTER — Emergency Department (HOSPITAL_COMMUNITY)
Admission: EM | Admit: 2020-03-13 | Discharge: 2020-03-13 | Disposition: A | Discharge status: Home / Self Care | Payer: BC Managed Care – PPO | Attending: Emergency Medicine | Admitting: Emergency Medicine

## 2020-03-13 DIAGNOSIS — Z5321 Procedure and treatment not carried out due to patient leaving prior to being seen by health care provider: Secondary | ICD-10-CM | POA: Diagnosis not present

## 2020-03-13 DIAGNOSIS — R062 Wheezing: Secondary | ICD-10-CM | POA: Insufficient documentation

## 2020-03-13 MED ORDER — ALBUTEROL SULFATE (2.5 MG/3ML) 0.083% IN NEBU: 5.0000 mg | INHALATION_SOLUTION | Freq: Once | RESPIRATORY_TRACT | Status: DC

## 2020-03-13 NOTE — ED Triage Notes (Signed)
Per pt, states history of asthma-states increased symptoms which started yesterday-rescue inhaler and neb treatment not working-wheezing

## 2020-03-19 ENCOUNTER — Ambulatory Visit: Payer: BC Managed Care – PPO

## 2020-03-24 ENCOUNTER — Ambulatory Visit: Payer: BC Managed Care – PPO

## 2020-03-26 ENCOUNTER — Encounter: Payer: BC Managed Care – PPO | Admitting: Nurse Practitioner

## 2020-04-03 ENCOUNTER — Telehealth: Payer: Self-pay

## 2020-04-03 NOTE — Telephone Encounter (Signed)
I called pharmacy to notify them that the PA for Saxenda has been approved also left pt v/m notifying her. YL,RMA

## 2020-04-04 ENCOUNTER — Other Ambulatory Visit: Payer: Self-pay

## 2020-04-04 ENCOUNTER — Inpatient Hospital Stay (HOSPITAL_COMMUNITY)
Admission: EM | Admit: 2020-04-04 | Discharge: 2020-04-06 | DRG: 189 | Disposition: A | Discharge status: Home / Self Care | Payer: BC Managed Care – PPO | Attending: Family Medicine | Admitting: Family Medicine

## 2020-04-04 ENCOUNTER — Encounter (HOSPITAL_COMMUNITY): Payer: Self-pay

## 2020-04-04 ENCOUNTER — Emergency Department (HOSPITAL_COMMUNITY): Payer: BC Managed Care – PPO

## 2020-04-04 ENCOUNTER — Observation Stay (HOSPITAL_COMMUNITY): Payer: BC Managed Care – PPO

## 2020-04-04 DIAGNOSIS — R918 Other nonspecific abnormal finding of lung field: Secondary | ICD-10-CM | POA: Diagnosis not present

## 2020-04-04 DIAGNOSIS — Z6841 Body Mass Index (BMI) 40.0 and over, adult: Secondary | ICD-10-CM | POA: Diagnosis not present

## 2020-04-04 DIAGNOSIS — I469 Cardiac arrest, cause unspecified: Secondary | ICD-10-CM | POA: Diagnosis present

## 2020-04-04 DIAGNOSIS — J45901 Unspecified asthma with (acute) exacerbation: Secondary | ICD-10-CM

## 2020-04-04 DIAGNOSIS — D72829 Elevated white blood cell count, unspecified: Secondary | ICD-10-CM | POA: Diagnosis not present

## 2020-04-04 DIAGNOSIS — T7840XA Allergy, unspecified, initial encounter: Secondary | ICD-10-CM

## 2020-04-04 DIAGNOSIS — Z8249 Family history of ischemic heart disease and other diseases of the circulatory system: Secondary | ICD-10-CM

## 2020-04-04 DIAGNOSIS — J9601 Acute respiratory failure with hypoxia: Principal | ICD-10-CM | POA: Diagnosis present

## 2020-04-04 DIAGNOSIS — J449 Chronic obstructive pulmonary disease, unspecified: Secondary | ICD-10-CM | POA: Diagnosis not present

## 2020-04-04 DIAGNOSIS — R092 Respiratory arrest: Secondary | ICD-10-CM | POA: Diagnosis not present

## 2020-04-04 DIAGNOSIS — I468 Cardiac arrest due to other underlying condition: Secondary | ICD-10-CM | POA: Diagnosis not present

## 2020-04-04 DIAGNOSIS — Z833 Family history of diabetes mellitus: Secondary | ICD-10-CM | POA: Diagnosis not present

## 2020-04-04 DIAGNOSIS — D75839 Thrombocytosis, unspecified: Secondary | ICD-10-CM | POA: Diagnosis present

## 2020-04-04 DIAGNOSIS — R7989 Other specified abnormal findings of blood chemistry: Secondary | ICD-10-CM | POA: Diagnosis present

## 2020-04-04 DIAGNOSIS — R404 Transient alteration of awareness: Secondary | ICD-10-CM | POA: Diagnosis not present

## 2020-04-04 DIAGNOSIS — Z20822 Contact with and (suspected) exposure to covid-19: Secondary | ICD-10-CM | POA: Diagnosis present

## 2020-04-04 DIAGNOSIS — D509 Iron deficiency anemia, unspecified: Secondary | ICD-10-CM | POA: Diagnosis present

## 2020-04-04 DIAGNOSIS — D473 Essential (hemorrhagic) thrombocythemia: Secondary | ICD-10-CM

## 2020-04-04 DIAGNOSIS — Z720 Tobacco use: Secondary | ICD-10-CM

## 2020-04-04 DIAGNOSIS — I499 Cardiac arrhythmia, unspecified: Secondary | ICD-10-CM | POA: Diagnosis not present

## 2020-04-04 DIAGNOSIS — R42 Dizziness and giddiness: Secondary | ICD-10-CM | POA: Diagnosis not present

## 2020-04-04 DIAGNOSIS — F17210 Nicotine dependence, cigarettes, uncomplicated: Secondary | ICD-10-CM | POA: Diagnosis not present

## 2020-04-04 DIAGNOSIS — I517 Cardiomegaly: Secondary | ICD-10-CM | POA: Diagnosis not present

## 2020-04-04 DIAGNOSIS — R000 Tachycardia, unspecified: Secondary | ICD-10-CM | POA: Diagnosis not present

## 2020-04-04 DIAGNOSIS — R0602 Shortness of breath: Secondary | ICD-10-CM | POA: Diagnosis not present

## 2020-04-04 HISTORY — DX: Respiratory arrest: R09.2

## 2020-04-04 LAB — HEPATIC FUNCTION PANEL
ALT: 16 U/L (ref 0–44)
AST: 22 U/L (ref 15–41)
Albumin: 3 g/dL — ABNORMAL LOW (ref 3.5–5.0)
Alkaline Phosphatase: 68 U/L (ref 38–126)
Bilirubin, Direct: 0.1 mg/dL (ref 0.0–0.2)
Indirect Bilirubin: 0.4 mg/dL (ref 0.3–0.9)
Total Bilirubin: 0.5 mg/dL (ref 0.3–1.2)
Total Protein: 7.2 g/dL (ref 6.5–8.1)

## 2020-04-04 LAB — I-STAT VENOUS BLOOD GAS, ED
Acid-base deficit: 2 mmol/L (ref 0.0–2.0)
Bicarbonate: 24.7 mmol/L (ref 20.0–28.0)
Calcium, Ion: 1.1 mmol/L — ABNORMAL LOW (ref 1.15–1.40)
HCT: 33 % — ABNORMAL LOW (ref 36.0–46.0)
Hemoglobin: 11.2 g/dL — ABNORMAL LOW (ref 12.0–15.0)
O2 Saturation: 99 %
Potassium: 4 mmol/L (ref 3.5–5.1)
Sodium: 140 mmol/L (ref 135–145)
TCO2: 26 mmol/L (ref 22–32)
pCO2, Ven: 51.2 mmHg (ref 44.0–60.0)
pH, Ven: 7.291 (ref 7.250–7.430)
pO2, Ven: 174 mmHg — ABNORMAL HIGH (ref 32.0–45.0)

## 2020-04-04 LAB — BASIC METABOLIC PANEL
Anion gap: 14 (ref 5–15)
BUN: 12 mg/dL (ref 6–20)
CO2: 20 mmol/L — ABNORMAL LOW (ref 22–32)
Calcium: 8.3 mg/dL — ABNORMAL LOW (ref 8.9–10.3)
Chloride: 104 mmol/L (ref 98–111)
Creatinine, Ser: 0.93 mg/dL (ref 0.44–1.00)
GFR calc Af Amer: >60 mL/min (ref >60)
GFR calc non Af Amer: >60 mL/min (ref >60)
Glucose, Bld: 189 mg/dL — ABNORMAL HIGH (ref 70–99)
Potassium: 4 mmol/L (ref 3.5–5.1)
Sodium: 138 mmol/L (ref 135–145)

## 2020-04-04 LAB — RAPID URINE DRUG SCREEN, HOSP PERFORMED
Amphetamines: NOT DETECTED
Barbiturates: NOT DETECTED
Benzodiazepines: NOT DETECTED
Cocaine: NOT DETECTED
Opiates: NOT DETECTED
Tetrahydrocannabinol: NOT DETECTED

## 2020-04-04 LAB — CBC WITH DIFFERENTIAL/PLATELET
Abs Immature Granulocytes: 0.2 10*3/uL — ABNORMAL HIGH (ref 0.00–0.07)
Basophils Absolute: 0 10*3/uL (ref 0.0–0.1)
Basophils Relative: 0 %
Eosinophils Absolute: 0 10*3/uL (ref 0.0–0.5)
Eosinophils Relative: 0 %
HCT: 33.7 % — ABNORMAL LOW (ref 36.0–46.0)
Hemoglobin: 8.7 g/dL — ABNORMAL LOW (ref 12.0–15.0)
Lymphocytes Relative: 22 %
Lymphs Abs: 3.8 10*3/uL (ref 0.7–4.0)
MCH: 18.8 pg — ABNORMAL LOW (ref 26.0–34.0)
MCHC: 25.8 g/dL — ABNORMAL LOW (ref 30.0–36.0)
MCV: 72.9 fL — ABNORMAL LOW (ref 80.0–100.0)
Monocytes Absolute: 0.9 10*3/uL (ref 0.1–1.0)
Monocytes Relative: 5 %
Myelocytes: 1 %
Neutro Abs: 12.3 10*3/uL — ABNORMAL HIGH (ref 1.7–7.7)
Neutrophils Relative %: 72 %
Platelets: 516 10*3/uL — ABNORMAL HIGH (ref 150–400)
RBC: 4.62 MIL/uL (ref 3.87–5.11)
RDW: 22.4 % — ABNORMAL HIGH (ref 11.5–15.5)
WBC: 17.1 10*3/uL — ABNORMAL HIGH (ref 4.0–10.5)
nRBC: 0.5 % — ABNORMAL HIGH (ref 0.0–0.2)
nRBC: 2 /100 WBC — ABNORMAL HIGH

## 2020-04-04 LAB — SARS CORONAVIRUS 2 BY RT PCR (HOSPITAL ORDER, PERFORMED IN ~~LOC~~ HOSPITAL LAB): SARS Coronavirus 2: NEGATIVE

## 2020-04-04 LAB — TROPONIN I (HIGH SENSITIVITY)
Troponin I (High Sensitivity): 9 ng/L (ref <18)
Troponin I (High Sensitivity): 9 ng/L (ref <18)

## 2020-04-04 LAB — HIV ANTIBODY (ROUTINE TESTING W REFLEX): HIV Screen 4th Generation wRfx: NONREACTIVE

## 2020-04-04 MED ORDER — FLUTICASONE FUROATE 27.5 MCG/SPRAY NA SUSP: 2.0000 | Freq: Every day | NASAL | Status: DC

## 2020-04-04 MED ORDER — ACETAMINOPHEN 650 MG RE SUPP: 650.0000 mg | Freq: Four times a day (QID) | RECTAL | Status: DC | PRN

## 2020-04-04 MED ORDER — FERROUS SULFATE 325 (65 FE) MG PO TABS
325.0000 mg | ORAL_TABLET | Freq: Every day | ORAL | Status: DC
  Administered 2020-04-05 – 2020-04-06 (×2): 325 mg via ORAL
  Filled 2020-04-04 (×2): qty 1

## 2020-04-04 MED ORDER — CALCIUM GLUCONATE-NACL 1-0.675 GM/50ML-% IV SOLN
1.0000 g | Freq: Once | INTRAVENOUS | Status: AC
  Administered 2020-04-04: 1000 mg via INTRAVENOUS
  Filled 2020-04-04: qty 50

## 2020-04-04 MED ORDER — ENOXAPARIN SODIUM 80 MG/0.8ML ~~LOC~~ SOLN
0.5000 mg/kg | SUBCUTANEOUS | Status: DC
  Administered 2020-04-05: 80 mg via SUBCUTANEOUS
  Filled 2020-04-04: qty 0.8

## 2020-04-04 MED ORDER — IPRATROPIUM-ALBUTEROL 0.5-2.5 (3) MG/3ML IN SOLN
3.0000 mL | Freq: Three times a day (TID) | RESPIRATORY_TRACT | Status: DC
  Administered 2020-04-04 – 2020-04-05 (×4): 3 mL via RESPIRATORY_TRACT
  Filled 2020-04-04 (×4): qty 3

## 2020-04-04 MED ORDER — IOHEXOL 350 MG/ML SOLN
100.0000 mL | Freq: Once | INTRAVENOUS | Status: AC | PRN
  Administered 2020-04-04: 100 mL via INTRAVENOUS

## 2020-04-04 MED ORDER — BUDESONIDE 0.5 MG/2ML IN SUSP
1.0000 mg | Freq: Two times a day (BID) | RESPIRATORY_TRACT | Status: DC
  Administered 2020-04-04 – 2020-04-06 (×4): 1 mg via RESPIRATORY_TRACT
  Filled 2020-04-04 (×4): qty 4

## 2020-04-04 MED ORDER — ENOXAPARIN SODIUM 40 MG/0.4ML ~~LOC~~ SOLN
40.0000 mg | Freq: Once | SUBCUTANEOUS | Status: AC
  Administered 2020-04-04: 40 mg via SUBCUTANEOUS
  Filled 2020-04-04: qty 0.4

## 2020-04-04 MED ORDER — ACETAMINOPHEN 325 MG PO TABS: 650.0000 mg | ORAL_TABLET | Freq: Four times a day (QID) | ORAL | Status: DC | PRN

## 2020-04-04 MED ORDER — EPINEPHRINE 0.3 MG/0.3ML IJ SOAJ
0.3000 mg | Freq: Once | INTRAMUSCULAR | Status: AC
  Administered 2020-04-04: 0.3 mg via INTRAMUSCULAR
  Filled 2020-04-04: qty 0.3

## 2020-04-04 MED ORDER — FLUTICASONE PROPIONATE 50 MCG/ACT NA SUSP
2.0000 | Freq: Every day | NASAL | Status: DC
  Administered 2020-04-05 – 2020-04-06 (×2): 2 via NASAL
  Filled 2020-04-04: qty 16

## 2020-04-04 MED ORDER — VARENICLINE TARTRATE 1 MG PO TABS
1.0000 mg | ORAL_TABLET | Freq: Two times a day (BID) | ORAL | Status: DC
  Administered 2020-04-05 – 2020-04-06 (×3): 1 mg via ORAL
  Filled 2020-04-04 (×5): qty 1

## 2020-04-04 MED ORDER — MONTELUKAST SODIUM 10 MG PO TABS
10.0000 mg | ORAL_TABLET | Freq: Every day | ORAL | Status: DC
  Administered 2020-04-04 – 2020-04-05 (×2): 10 mg via ORAL
  Filled 2020-04-04 (×2): qty 1

## 2020-04-04 MED ORDER — GUAIFENESIN ER 600 MG PO TB12
600.0000 mg | ORAL_TABLET | Freq: Two times a day (BID) | ORAL | Status: DC | PRN
  Administered 2020-04-04 – 2020-04-06 (×2): 600 mg via ORAL
  Filled 2020-04-04 (×2): qty 1

## 2020-04-04 MED ORDER — ENOXAPARIN SODIUM 40 MG/0.4ML ~~LOC~~ SOLN
40.0000 mg | SUBCUTANEOUS | Status: DC
  Administered 2020-04-04: 40 mg via SUBCUTANEOUS
  Filled 2020-04-04: qty 0.4

## 2020-04-04 MED ORDER — NICOTINE 21 MG/24HR TD PT24
21.0000 mg | MEDICATED_PATCH | Freq: Every day | TRANSDERMAL | Status: DC
  Administered 2020-04-04 – 2020-04-06 (×3): 21 mg via TRANSDERMAL
  Filled 2020-04-04 (×3): qty 1

## 2020-04-04 MED ORDER — ARFORMOTEROL TARTRATE 15 MCG/2ML IN NEBU
15.0000 ug | INHALATION_SOLUTION | Freq: Two times a day (BID) | RESPIRATORY_TRACT | Status: DC
  Administered 2020-04-04 – 2020-04-06 (×4): 15 ug via RESPIRATORY_TRACT
  Filled 2020-04-04 (×4): qty 2

## 2020-04-04 MED ORDER — ONDANSETRON HCL 4 MG/2ML IJ SOLN: 4.0000 mg | Freq: Four times a day (QID) | INTRAMUSCULAR | Status: DC | PRN

## 2020-04-04 MED ORDER — ONDANSETRON HCL 4 MG PO TABS: 4.0000 mg | ORAL_TABLET | Freq: Four times a day (QID) | ORAL | Status: DC | PRN

## 2020-04-04 MED ORDER — SODIUM CHLORIDE 0.9% FLUSH
3.0000 mL | Freq: Two times a day (BID) | INTRAVENOUS | Status: DC
  Administered 2020-04-04 – 2020-04-06 (×5): 3 mL via INTRAVENOUS

## 2020-04-04 MED ORDER — MAGNESIUM SULFATE 2 GM/50ML IV SOLN
2.0000 g | Freq: Once | INTRAVENOUS | Status: AC
  Administered 2020-04-04: 2 g via INTRAVENOUS
  Filled 2020-04-04: qty 50

## 2020-04-04 MED ORDER — IPRATROPIUM-ALBUTEROL 0.5-2.5 (3) MG/3ML IN SOLN: 3.0000 mL | RESPIRATORY_TRACT | Status: DC | PRN

## 2020-04-04 MED ORDER — ALBUTEROL SULFATE HFA 108 (90 BASE) MCG/ACT IN AERS
4.0000 | INHALATION_SPRAY | RESPIRATORY_TRACT | Status: AC
  Administered 2020-04-04 (×3): 4 via RESPIRATORY_TRACT
  Filled 2020-04-04: qty 6.7
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
## 2020-04-04 NOTE — Progress Notes (Signed)
Cool aerosolized neb started due to c/o airway soreness. RN aware.

## 2020-04-04 NOTE — ED Provider Notes (Signed)
[facility] EMERGENCY DEPARTMENT Provider Note   CSN: [id number] Arrival date & time: 04/04/20  1157     History Chief Complaint  Patient presents with   Post CPR    April Ellis is a 44 y.o. female.  HPI    44 year old female comes in a chief complaint of respiratory arrest.  Patient has history of asthma-COPD.  She also has elevated BMI.  EMS reports that patient had called 911 because of shortness of breath.  When they arrived patient fainted and fell forward.  Patient was found to be pulseless.  CPR was initiated and patient received 5 minutes of CPR.  No epi was provided.  In route patient became more responsive and she arrives to the ER alert and oriented x3.  Patient states that she woke up this morning feeling little short of breath.  She took nebulizer treatment and felt better.  While at her friend's place she started having itching all over and decided to go home.  While she was driving she suddenly became short of breath.  She has some discomfort in her throat.  She is denying any oral swelling or feeling like her throat was closing before she passed out.  Pt has no hx of PE, DVT and denies any exogenous hormone (testosterone / estrogen) use, long distance travels or surgery in the past 6 weeks, active cancer, recent immobilization.   Past Medical History:  Diagnosis Date   Anemia    Asthma    Bronchitis    COPD (chronic obstructive pulmonary disease) ([place])    Dyspnea    with asthma flair   Obesity     Patient Active Problem List   Diagnosis Date Noted   Acute respiratory failure with hypoxia ([place]) 04/04/2020   Obstructive sleep apnea 06/13/2019   Nocturnal hypoxemia 06/13/2019   Daytime somnolence 04/04/2019   Dyspnea 02/15/2019   Allergic reactions 08/16/2016   Chronic rhinitis 08/16/2016   Asthma, chronic, unspecified asthma severity, with acute exacerbation 09/30/2014   Leukocytosis 09/30/2014   Urticaria     Iron deficiency anemia 08/14/2014   History of tobacco abuse 08/14/2014   Atypical chest pain 06/27/2014   Tobacco abuse 05/12/2014   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   Class 3 severe obesity due to excess calories without serious comorbidity with body mass index (BMI) of 60.0 to 69.9 in adult [facility]) 12/19/2008   ELEVATED BLOOD PRESSURE WITHOUT DIAGNOSIS OF HYPERTENSION 12/19/2008    Past Surgical History:  Procedure Laterality Date   DENTAL SURGERY     2 teeth pulled, 11/22/14   DILITATION & CURRETTAGE/HYSTROSCOPY WITH NOVASURE ABLATION N/A 03/05/2020   Procedure: DILATATION & CURETTAGE/HYSTEROSCOPY WITH NOVASURE ABLATION;  Surgeon: Molli Posey, MD;  Location: [facility];  Service: Gynecology;  Laterality: N/A;   NO PAST SURGERIES       OB History   No obstetric history on file.     Family History  Problem Relation Age of Onset   Diabetes Mother    Cancer Mother    Diabetes Sister    Hypertension Sister    Hypertension Sister    Urticaria Neg Hx    Immunodeficiency Neg Hx    Eczema Neg Hx    Asthma Neg Hx    Angioedema Neg Hx    Allergic rhinitis Neg Hx     Social History   Tobacco Use   Smoking status: Light Tobacco Smoker    Packs/day: 0.25    Years: 15.00  Pack years: 3.75    Types: Cigarettes    Last attempt to quit: 09/16/2014    Years since quitting: 5.5   Smokeless tobacco: Never Used  Vaping Use   Vaping Use: Never used  Substance Use Topics   Alcohol use: Yes    Alcohol/week: 0.0 standard drinks    Comment: occas.   Drug use: Yes    Types: Marijuana    Comment: occas.    Home Medications Prior to Admission medications   Medication Sig Start Date End Date Taking? Authorizing Provider  albuterol (PROVENTIL) (2.5 MG/3ML) 0.083% nebulizer solution Take 3 mLs by nebulization 4 (four) times daily. 02/27/20  Yes [provider]  albuterol (VENTOLIN HFA) 108 (90 Base) MCG/ACT inhaler Inhale 2 puffs into the  lungs every 4 (four) hours as needed for wheezing or shortness of breath. 07/06/19  Yes Young, Clinton D, MD  Benralizumab [facility]) 30 MG/ML SOSY Inject 1 mL (30 mg total) into the skin every 8 (eight) weeks. 02/11/20  Yes Young, Clinton D, MD  budesonide (PULMICORT) 1 MG/2ML nebulizer solution Take 2 mLs by nebulization 4 (four) times daily as needed for wheezing. 04/02/20  Yes [provider]  budesonide-formoterol (SYMBICORT) 160-4.5 MCG/ACT inhaler Inhale 2 puffs, then rinse mouth, twice daily 01/09/20  Yes Young, Clinton D, MD  EPINEPHrine 0.3 mg/0.3 mL IJ SOAJ injection Inject 0.3 mLs (0.3 mg total) into the muscle as needed for anaphylaxis. 07/24/19  Yes Young, Tarri Fuller D, MD  ferrous sulfate 325 (65 FE) MG EC tablet Take 1 tablet (325 mg total) by mouth daily. 08/21/19  Yes Ngetich, Dinah C, NP  fluticasone (FLONASE SENSIMIST) 27.5 MCG/SPRAY nasal spray Place 2 sprays into the nose daily. 02/20/20  Yes [name], FNP  guaiFENesin (MUCINEX) 600 MG 12 hr tablet Take 600 mg by mouth 2 (two) times daily as needed for cough.   Yes [provider]  ipratropium-albuterol (DUONEB) 0.5-2.5 (3) MG/3ML SOLN INHALE 1 VIAL BY MOUTH VIA NEBULIZER EVERY 6 HOURS AS NEEDED Patient taking differently: Take 3 mLs by nebulization every 6 (six) hours as needed (wheezing shortness of breath).  01/09/20  Yes Young, Clinton D, MD  montelukast (SINGULAIR) 10 MG tablet TAKE 1 TABLET BY MOUTH EVERYDAY AT BEDTIME Patient taking differently: Take 10 mg by mouth at bedtime.  09/07/19  Yes Ngetich, Dinah C, NP  nicotine (NICODERM CQ - DOSED IN MG/24 HOURS) 21 mg/24hr patch Place 1 patch (21 mg total) onto the skin daily. 01/16/20  Yes Yopp, Amber C, RPH-CPP  predniSONE (DELTASONE) 20 MG tablet Take 20 mg by mouth taper from 4 doses each day to 1 dose and stop. 04/01/20  Yes [provider]  SAXENDA 18 MG/3ML SOPN Inject 2.5 mg into the skin daily. 12/31/19  Yes [provider]  SPIRIVA HANDIHALER  18 MCG inhalation capsule INHALE 1 CAPSULE VIA HANDIHALER ONCE DAILY AT [place] Patient taking differently: Place 18 mcg into inhaler and inhale daily. Same time everyday 03/11/20  Yes Young, Tarri Fuller D, MD  varenicline (CHANTIX CONTINUING MONTH PAK) 1 MG tablet Take 1 tablet (1 mg total) by mouth 2 (two) times daily. 01/16/20  Yes Yopp, Amber C, RPH-CPP    Allergies    Penicillins  Review of Systems   Review of Systems  Constitutional: Positive for activity change.  Respiratory: Positive for shortness of breath.   Cardiovascular: Negative for chest pain.  Gastrointestinal: Negative for nausea and vomiting.  Allergic/Immunologic: Negative for immunocompromised state.  All  other systems reviewed and are negative.   Physical Exam Updated Vital Signs BP 121/68 (BP Location: Right Arm)    Pulse (!) 101    Resp 20    Ht 5\' 1"  (1.549 m)    Wt (!) 162 kg    LMP 03/05/2020 (Exact Date)    SpO2 97%    BMI 67.48 kg/m   Physical Exam Vitals and nursing note reviewed.  Constitutional:      Appearance: She is well-developed.  HENT:     Head: Normocephalic and atraumatic.  Cardiovascular:     Rate and Rhythm: Normal rate.  Pulmonary:     Effort: Pulmonary effort is normal.     Comments: Mild wheezing Abdominal:     General: Bowel sounds are normal.  Musculoskeletal:     Cervical back: Normal range of motion and neck supple.  Skin:    General: Skin is warm and dry.  Neurological:     Mental Status: She is alert and oriented to person, place, and time.     ED Results / Procedures / Treatments   Labs (all labs ordered are listed, but only abnormal results are displayed) Labs Reviewed  CBC WITH DIFFERENTIAL/PLATELET - Abnormal; Notable for the following components:      Result Value   WBC 17.1 (*)    Hemoglobin 8.7 (*)    HCT 33.7 (*)    MCV 72.9 (*)    MCH 18.8 (*)    MCHC 25.8 (*)    RDW 22.4 (*)    Platelets 516 (*)    nRBC 0.5 (*)    Neutro Abs 12.3 (*)     nRBC 2 (*)    Abs Immature Granulocytes 0.20 (*)    All other components within normal limits  BASIC METABOLIC PANEL - Abnormal; Notable for the following components:   CO2 20 (*)    Glucose, Bld 189 (*)    Calcium 8.3 (*)    All other components within normal limits  I-STAT VENOUS BLOOD GAS, ED - Abnormal; Notable for the following components:   pO2, Ven 174.0 (*)    Calcium, Ion 1.10 (*)    HCT 33.0 (*)    Hemoglobin 11.2 (*)    All other components within normal limits  SARS CORONAVIRUS 2 BY RT PCR (HOSPITAL ORDER, [place])  HIV ANTIBODY (ROUTINE TESTING W REFLEX)  RAPID URINE DRUG SCREEN, HOSP PERFORMED  HEPATIC FUNCTION PANEL  TROPONIN I (HIGH SENSITIVITY)    EKG EKG Interpretation  Date/Time:  Friday April 04 2020 11:59:55 EDT Ventricular Rate:  123 PR Interval:    QRS Duration: 88 QT Interval:  325 QTC Calculation: 465 R Axis:   79 Text Interpretation: Sinus tachycardia Minimal ST depression, inferior leads No acute changes Confirmed by Varney Biles ([id number]) on 04/04/2020 2:58:57 PM   Radiology DG Chest Port 1 View  Result Date: 04/04/2020 CLINICAL DATA:  Dizziness. EXAM: PORTABLE CHEST 1 VIEW COMPARISON:  10/19/2019. FINDINGS: Mediastinum and hilar structures normal. Cardiomegaly. No pulmonary venous congestion. Low lung volumes. Infiltrate in the right lung base cannot be excluded. No pleural effusion or pneumothorax. IMPRESSION: 1.  Cardiomegaly.  No pulmonary venous congestion. 2. Low lung volumes. Infiltrate in the right lung base cannot be excluded. Electronically Signed   By: Marcello Moores  Register   On: 04/04/2020 12:49    Procedures .Critical Care Performed by: Varney Biles, MD Authorized by: Varney Biles, MD   Critical care provider statement:    Critical  care time (minutes):  45   Critical care was necessary to treat or prevent imminent or life-threatening deterioration of the following conditions:  Respiratory failure    Critical care was time spent personally by me on the following activities:  Discussions with consultants, evaluation of patient's response to treatment, examination of patient, ordering and performing treatments and interventions, ordering and review of laboratory studies, ordering and review of radiographic studies, pulse oximetry, re-evaluation of patient's condition, obtaining history from patient or surrogate and review of old charts   (including critical care time)  Medications Ordered in ED Medications  nicotine (NICODERM CQ - dosed in mg/24 hours) patch 21 mg (has no administration in time range)  enoxaparin (LOVENOX) injection 40 mg (has no administration in time range)  sodium chloride flush (NS) 0.9 % injection 3 mL (has no administration in time range)  ondansetron (ZOFRAN) tablet 4 mg (has no administration in time range)    Or  ondansetron (ZOFRAN) injection 4 mg (has no administration in time range)  acetaminophen (TYLENOL) tablet 650 mg (has no administration in time range)    Or  acetaminophen (TYLENOL) suppository 650 mg (has no administration in time range)  ipratropium-albuterol (DUONEB) 0.5-2.5 (3) MG/3ML nebulizer solution 3 mL (has no administration in time range)  budesonide (PULMICORT) nebulizer solution 1 mg (has no administration in time range)  arformoterol (BROVANA) nebulizer solution 15 mcg (has no administration in time range)  calcium gluconate 1 g/ 50 mL sodium chloride IVPB (has no administration in time range)  albuterol (VENTOLIN HFA) 108 (90 Base) MCG/ACT inhaler 4 puff (4 puffs Inhalation Given 04/04/20 1340)  EPINEPHrine (EPI-PEN) injection 0.3 mg (0.3 mg Intramuscular Given 04/04/20 1224)  magnesium sulfate IVPB 2 g 50 mL (0 g Intravenous Stopped 04/04/20 1324)    ED Course  I have reviewed the triage vital signs and the nursing notes.  Pertinent labs & imaging results that were available during my care of the patient were reviewed by me and considered in  my medical decision making (see chart for details).    MDM Rules/Calculators/A&P                          44 year old comes in a chief complaint of respiratory arrest.  She is tachycardic, otherwise not in any respiratory distress.  Lungs are clear for [place].  EMS reports diffuse wheezing when they first picked her up.  She has received some treatment per EMS prior to ED arrival.  Differential diagnosis includes respiratory arrest secondary to either asthma exacerbation versus anaphylaxis.  She will receive IM epi.  We will give her some albuterol as well.  She has received steroids per EMS.  IV magnesium also ordered.  3:03 PM Patient reassessed. She is comfortable now.  We will get a CT PE, hospitalist will follow up.  Final Clinical Impression(s) / ED Diagnoses Final diagnoses:  Respiratory arrest ([place])  Asthma with acute exacerbation, unspecified asthma severity, unspecified whether persistent  Allergic reaction, initial encounter    Rx / DC Orders ED Discharge Orders    None       Varney Biles, MD 04/04/20 1506

## 2020-04-05 DIAGNOSIS — Z20822 Contact with and (suspected) exposure to covid-19: Secondary | ICD-10-CM | POA: Diagnosis present

## 2020-04-05 DIAGNOSIS — D509 Iron deficiency anemia, unspecified: Secondary | ICD-10-CM | POA: Diagnosis present

## 2020-04-05 DIAGNOSIS — I468 Cardiac arrest due to other underlying condition: Secondary | ICD-10-CM | POA: Diagnosis present

## 2020-04-05 DIAGNOSIS — R092 Respiratory arrest: Secondary | ICD-10-CM | POA: Diagnosis present

## 2020-04-05 DIAGNOSIS — I517 Cardiomegaly: Secondary | ICD-10-CM | POA: Diagnosis present

## 2020-04-05 DIAGNOSIS — J449 Chronic obstructive pulmonary disease, unspecified: Secondary | ICD-10-CM | POA: Diagnosis present

## 2020-04-05 DIAGNOSIS — F17210 Nicotine dependence, cigarettes, uncomplicated: Secondary | ICD-10-CM | POA: Diagnosis present

## 2020-04-05 DIAGNOSIS — R7989 Other specified abnormal findings of blood chemistry: Secondary | ICD-10-CM | POA: Diagnosis present

## 2020-04-05 DIAGNOSIS — Z833 Family history of diabetes mellitus: Secondary | ICD-10-CM | POA: Diagnosis not present

## 2020-04-05 DIAGNOSIS — D72829 Elevated white blood cell count, unspecified: Secondary | ICD-10-CM | POA: Diagnosis present

## 2020-04-05 DIAGNOSIS — Z6841 Body Mass Index (BMI) 40.0 and over, adult: Secondary | ICD-10-CM | POA: Diagnosis not present

## 2020-04-05 DIAGNOSIS — Z8249 Family history of ischemic heart disease and other diseases of the circulatory system: Secondary | ICD-10-CM | POA: Diagnosis not present

## 2020-04-05 DIAGNOSIS — J9601 Acute respiratory failure with hypoxia: Secondary | ICD-10-CM | POA: Diagnosis present

## 2020-04-05 LAB — CBC
HCT: 30.3 % — ABNORMAL LOW (ref 36.0–46.0)
Hemoglobin: 7.8 g/dL — ABNORMAL LOW (ref 12.0–15.0)
MCH: 18.6 pg — ABNORMAL LOW (ref 26.0–34.0)
MCHC: 25.7 g/dL — ABNORMAL LOW (ref 30.0–36.0)
MCV: 72.3 fL — ABNORMAL LOW (ref 80.0–100.0)
Platelets: 383 10*3/uL (ref 150–400)
RBC: 4.19 MIL/uL (ref 3.87–5.11)
RDW: 21.5 % — ABNORMAL HIGH (ref 11.5–15.5)
WBC: 16.9 10*3/uL — ABNORMAL HIGH (ref 4.0–10.5)
nRBC: 0 % (ref 0.0–0.2)

## 2020-04-05 LAB — IRON AND TIBC
Iron: 13 ug/dL — ABNORMAL LOW (ref 28–170)
Saturation Ratios: 3 % — ABNORMAL LOW (ref 10.4–31.8)
TIBC: 388 ug/dL (ref 250–450)
UIBC: 375 ug/dL

## 2020-04-05 LAB — PROCALCITONIN: Procalcitonin: <0.1 ng/mL

## 2020-04-05 LAB — BASIC METABOLIC PANEL
Anion gap: 7 (ref 5–15)
BUN: 8 mg/dL (ref 6–20)
CO2: 29 mmol/L (ref 22–32)
Calcium: 8.5 mg/dL — ABNORMAL LOW (ref 8.9–10.3)
Chloride: 104 mmol/L (ref 98–111)
Creatinine, Ser: 0.74 mg/dL (ref 0.44–1.00)
GFR calc Af Amer: >60 mL/min (ref >60)
GFR calc non Af Amer: >60 mL/min (ref >60)
Glucose, Bld: 124 mg/dL — ABNORMAL HIGH (ref 70–99)
Potassium: 4.1 mmol/L (ref 3.5–5.1)
Sodium: 140 mmol/L (ref 135–145)

## 2020-04-05 LAB — FERRITIN: Ferritin: 2 ng/mL — ABNORMAL LOW (ref 11–307)

## 2020-04-05 MED ORDER — SODIUM CHLORIDE 0.9 % IV SOLN
500.0000 mg | INTRAVENOUS | Status: DC
  Administered 2020-04-05: 500 mg via INTRAVENOUS
  Filled 2020-04-05 (×2): qty 500

## 2020-04-05 MED ORDER — SODIUM CHLORIDE 0.9 % IV SOLN
2.0000 g | INTRAVENOUS | Status: DC
  Administered 2020-04-05 – 2020-04-06 (×2): 2 g via INTRAVENOUS
  Filled 2020-04-05 (×2): qty 20

## 2020-04-05 MED ORDER — SODIUM CHLORIDE 0.9 % IV SOLN
1.0000 g | INTRAVENOUS | Status: DC
  Filled 2020-04-05: qty 10

## 2020-04-05 NOTE — Progress Notes (Signed)
PROGRESS NOTE    April Ellis  BJY:[id number] DOB: December 02, 1975 DOA: 04/04/2020 PCP: [name], FNP   Brief Narrative:  HPI: April Ellis is a 44 y.o. 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  History is obtained from review of records and from the patient.  From what she can remember she reported feeling as though she could not breathe while driving in her car and complained of itching all over.  She was able to call 911 and pull over.  Upon EMS arrival patient reportedly complained of being unable to breathe before passing out and falling out of the car.  Patient reportedly lost pulses and had a PEA rhythm for which CPR was initiated.  She reportedly received 5 minutes of CPR without epinephrine or shock given, and had return of spontaneous circulation.  Patient reports having similar episodes in the past, but never at loss consciousness before or requiring CPR.  Denies having any leg swelling, calf pain, chest pain, or recent sick contacts or knowledge.  She did note that she felt malaise for which she had taken prednisone and Benadryl this morning.  Denies any new medications or coming into contact with anything out of the ordinary that would put her at risk for having allergic reaction.  She reports having issues with acute onset of shortness of breath previously in the past but never had lost consciousness or passed out before.  ED Course: Upon admission into the emergency department patient was seen to be tachycardic with O2 saturations currently maintained on 6 L nasal cannula oxygen.  Labs significant for WBC 17.1, hemoglobin 11.2, and platelets 516.  Venous pH was noted to be within normal limits 7.291 with PCO2 51.2.  Chest x-ray showed cardiomegaly with possible infiltrate in the right lower  lobe.  Patient had been given albuterol, epinephrine 0.3 mg, and 2 g of magnesium sulfate.  TRH called to admit.  Assessment & Plan:   Principal Problem:   Respiratory arrest ([place]) Active Problems:   Class 3 severe obesity due to excess calories without serious comorbidity with body mass index (BMI) of 60.0 to 69.9 in adult [facility])   Asthma-COPD overlap syndrome (HCC)   Tobacco abuse   Iron deficiency anemia   Leukocytosis   Acute respiratory failure with hypoxia (HCC)   Cardiac arrest (HCC)   Thrombocytosis (HCC)   Respiratory and cardiac arrest: Patient complained of being unable to breathe prior to sending pulse for which CPR was started.  Reportedly, CPR was performed for approximately 5 minutes without patient receiving cardiac shock prior to return of spontaneous circulation.  Venous blood gas was within normal limits with PCO2 51.2. CT angiogram negative for PE or any other acute pathology.  Question possible allergic reaction as cause with reports of itching before.  Asthma-COPD overlap syndrome: Patient had received 125 mg of Solu-Medrol, 2 g of magnesium, and breathing treatments.  Followed by Dr. Baird Lyons of pulmonology in the outpatient setting and just recently been started on Fasenra. -Brovana and budesonide nebs twice daily -DuoNebs 4 times daily -Continue Singulair, Flonase, and Mucinex  Leukocytosis: Acute on chronic.  WBC elevated 17.1.  She denies any infectious symptoms. -Recheck CBC in a.m.  Aspiration pneumonia/acute hypoxic respiratory failure: Interestingly, chest x-ray showed right lower lobe infiltrate but CT angiogram of the chest did not indicate any consolidation or infiltrate.  Patient was requiring 6 L of oxygen yesterday and now she is on 3 L.  Her lungs are clear to auscultation.  Not wheezy no crackles or any rhonchi.  I do not think she has any acute exacerbation of asthma or COPD.  Procalcitonin also less than 0.10.  She has leukocytosis but she is  afebrile.  Cannot figure out a specific cause of her hypoxia.  We will start her on Rocephin and Zithromax for now.  Try to wean oxygen.  Reassess tomorrow.  Iron deficiency anemia: This is chronic and hemoglobin is stable.  Thrombocytosis: Acute on chronic.  Now resolved.  Morbid obesity: BMI 67.48 kg/m -Continue to counsel on need weight loss  Tobacco abuse: Patient currently in the process of trying to quit.  Home medications include Chantix 1 mg twice daily and nicotine patch. -Continue current regimen  DVT prophylaxis: Lovenox   Code Status: Full Code  Family Communication: None present at bedside.  Plan of care discussed with patient in length and he verbalized understanding and agreed with it.  Status is: Inpatient  Remains inpatient appropriate because:Hemodynamically unstable   Dispo: The patient is from: Home              Anticipated d/c is to: Home              Anticipated d/c date is: 1 day              Patient currently is not medically stable to d/c.        Estimated body mass index is 67.48 kg/m as calculated from the following:   Height as of this encounter: 5\' 1"  (1.549 m).   Weight as of this encounter: 162 kg.      Nutritional status:               Consultants:   None  Procedures:   None  Antimicrobials:  Anti-infectives (From admission, onward)   Start     Dose/Rate Route Frequency Ordered Stop   04/05/20 1000  cefTRIAXone (ROCEPHIN) 1 g in sodium chloride 0.9 % 100 mL IVPB  Status:  Discontinued       Note to Pharmacy: Please double check if she has received any cephalosporins in the past and has tolerated without any allergic reaction.  If she has history of allergic reaction to cephalosporins then please start on alternate options for aspiration pneumonia. YES She has received Cephs in the past w/o reaction.   1 g 200 mL/hr over 30 Minutes Intravenous Every 24 hours 04/05/20 0752 04/05/20 0818   04/05/20 1000   azithromycin (ZITHROMAX) 500 mg in sodium chloride 0.9 % 250 mL IVPB     Discontinue     500 mg 250 mL/hr over 60 Minutes Intravenous Every 24 hours 04/05/20 0752     04/05/20 1000  cefTRIAXone (ROCEPHIN) 2 g in sodium chloride 0.9 % 100 mL IVPB     Discontinue    Note to Pharmacy: Please double check if she has received any cephalosporins in the past and has tolerated without any allergic reaction.  If she has history of allergic reaction to cephalosporins then please start on alternate options for aspiration pneumonia. YES She has received Cephs in the past w/o reaction.   2 g 200 mL/hr over 30 Minutes Intravenous Every 24 hours 04/05/20 0818           Subjective: Seen and examined.  She has no complaint other than anterior sternal pain due to CPR  performed yesterday.  Denies any cough, shortness of breath or fever.  Objective: Vitals:   04/05/20 0736 04/05/20 0749 04/05/20 1209 04/05/20 1347  BP:  123/74 133/61   Pulse:  78 74   Resp:  13 17   Temp:  98.9 F (37.2 C) 97.9 F (36.6 C)   TempSrc:  Oral Oral   SpO2: 98% 98% 100% 97%  Weight:      Height:        Intake/Output Summary (Last 24 hours) at 04/05/2020 1503 Last data filed at 04/04/2020 1635 Gross per 24 hour  Intake 50 ml  Output --  Net 50 ml   Filed Weights   04/04/20 1203  Weight: (!) 162 kg    Examination:  General exam: Appears calm and comfortable, obese Respiratory system: Clear to auscultation. Respiratory effort normal.  Point tenderness at the sternum anteriorly. Cardiovascular system: S1 & S2 heard, RRR. No JVD, murmurs, rubs, gallops or clicks. No pedal edema. Gastrointestinal system: Abdomen is nondistended, soft and nontender. No organomegaly or masses felt. Normal bowel sounds heard. Central nervous system: Alert and oriented. No focal neurological deficits. Extremities: Symmetric 5 x 5 power. Skin: No rashes, lesions or ulcers Psychiatry: Judgement and insight appear normal. Mood & affect  appropriate.    Data Reviewed: I have personally reviewed following labs and imaging studies  CBC: Recent Labs  Lab 04/04/20 1212 04/04/20 1241 04/05/20 0356  WBC 17.1*  --  16.9*  NEUTROABS 12.3*  --   --   HGB 8.7* 11.2* 7.8*  HCT 33.7* 33.0* 30.3*  MCV 72.9*  --  72.3*  PLT 516*  --  097   Basic Metabolic Panel: Recent Labs  Lab 04/04/20 1212 04/04/20 1241 04/05/20 0356  NA 138 140 140  K 4.0 4.0 4.1  CL 104  --  104  CO2 20*  --  29  GLUCOSE 189*  --  124*  BUN 12  --  8  CREATININE 0.93  --  0.74  CALCIUM 8.3*  --  8.5*   GFR: Estimated Creatinine Clearance: 135.2 mL/min (by C-G formula based on SCr of 0.74 mg/dL). Liver Function Tests: Recent Labs  Lab 04/04/20 1705  AST 22  ALT 16  ALKPHOS 68  BILITOT 0.5  PROT 7.2  ALBUMIN 3.0*   No results for input(s): LIPASE, AMYLASE in the last 168 hours. No results for input(s): AMMONIA in the last 168 hours. Coagulation Profile: No results for input(s): INR, PROTIME in the last 168 hours. Cardiac Enzymes: No results for input(s): CKTOTAL, CKMB, CKMBINDEX, TROPONINI in the last 168 hours. BNP (last 3 results) No results for input(s): PROBNP in the last 8760 hours. HbA1C: No results for input(s): HGBA1C in the last 72 hours. CBG: No results for input(s): GLUCAP in the last 168 hours. Lipid Profile: No results for input(s): CHOL, HDL, LDLCALC, TRIG, CHOLHDL, LDLDIRECT in the last 72 hours. Thyroid Function Tests: No results for input(s): TSH, T4TOTAL, FREET4, T3FREE, THYROIDAB in the last 72 hours. Anemia Panel: Recent Labs    04/05/20 0356  FERRITIN 2*  TIBC 388  IRON 13*   Sepsis Labs: Recent Labs  Lab 04/05/20 0356  PROCALCITON <0.10    Recent Results (from the past 240 hour(s))  SARS Coronavirus 2 by RT PCR (hospital order, performed in [facility] hospital lab) Nasopharyngeal Nasopharyngeal Swab     Status: None   Collection Time: 04/04/20 12:14 PM   Specimen: Nasopharyngeal Swab  Result  Value Ref Range Status  SARS Coronavirus 2 NEGATIVE NEGATIVE Final    Comment: (NOTE) SARS-CoV-2 target nucleic acids are NOT DETECTED.  The SARS-CoV-2 RNA is generally detectable in upper and lower respiratory specimens during the acute phase of infection. The lowest concentration of SARS-CoV-2 viral copies this assay can detect is 250 copies / mL. A negative result does not preclude SARS-CoV-2 infection and should not be used as the sole basis for treatment or other patient management decisions.  A negative result may occur with improper specimen collection / handling, submission of specimen other than nasopharyngeal swab, presence of viral mutation(s) within the areas targeted by this assay, and inadequate number of viral copies (<250 copies / mL). A negative result must be combined with clinical observations, patient history, and epidemiological information.  Fact Sheet for Patients:   StrictlyIdeas.no  Fact Sheet for Healthcare Providers: BankingDealers.co.za  This test is not yet approved or  cleared by the [place] FDA and has been authorized for detection and/or diagnosis of SARS-CoV-2 by FDA under an Emergency Use Authorization (EUA).  This EUA will remain in effect (meaning this test can be used) for the duration of the COVID-19 declaration under Section 564(b)(1) of the Act, 21 U.S.C. section 360bbb-3(b)(1), unless the authorization is terminated or revoked sooner.  Performed at [facility], [place] [address], [place], [place] [address]       Radiology Studies: CT Angio Chest PE W and/or Wo Contrast  Result Date: 04/04/2020 CLINICAL DATA:  Cardiac and respiratory arrest, history of asthma EXAM: CT ANGIOGRAPHY CHEST WITH CONTRAST TECHNIQUE: Multidetector CT imaging of the chest was performed using the standard protocol during bolus administration of intravenous contrast. Multiplanar CT image reconstructions and  MIPs were obtained to evaluate the vascular anatomy. CONTRAST:  173mL OMNIPAQUE IOHEXOL 350 MG/ML SOLN COMPARISON:  04/04/2020 FINDINGS: Cardiovascular: This is a technically adequate evaluation of the pulmonary vasculature. No filling defects or pulmonary emboli. The heart is unremarkable without pericardial effusion. The thoracic aorta is normal in caliber with no aneurysm or dissection. Mediastinum/Nodes: No enlarged mediastinal, hilar, or axillary lymph nodes. Thyroid gland, trachea, and esophagus demonstrate no significant findings. Lungs/Pleura: Minimal hypoventilatory changes within the dependent lower lobes. No airspace disease, effusion, or pneumothorax. Central airways are patent. Upper Abdomen: No acute abnormality. Musculoskeletal: No acute or destructive bony lesions. Reconstructed images demonstrate no additional findings. Review of the MIP images confirms the above findings. IMPRESSION: 1. No evidence of pulmonary embolus. 2. No acute intrathoracic process. Electronically Signed   By: Randa Ngo M.D.   On: 04/04/2020 17:14   DG Chest Port 1 View  Result Date: 04/04/2020 CLINICAL DATA:  Dizziness. EXAM: PORTABLE CHEST 1 VIEW COMPARISON:  10/19/2019. FINDINGS: Mediastinum and hilar structures normal. Cardiomegaly. No pulmonary venous congestion. Low lung volumes. Infiltrate in the right lung base cannot be excluded. No pleural effusion or pneumothorax. IMPRESSION: 1.  Cardiomegaly.  No pulmonary venous congestion. 2. Low lung volumes. Infiltrate in the right lung base cannot be excluded. Electronically Signed   By: Marcello Moores  Register   On: 04/04/2020 12:49    Scheduled Meds: . arformoterol  15 mcg Nebulization BID  . budesonide (PULMICORT) nebulizer solution  1 mg Nebulization BID  . enoxaparin (LOVENOX) injection  0.5 mg/kg Subcutaneous Q24H  . ferrous sulfate  325 mg Oral Q breakfast  . fluticasone  2 spray Each Nare Daily  . ipratropium-albuterol  3 mL Nebulization TID  . montelukast   10 mg Oral QHS  . nicotine  21 mg Transdermal  Daily  . sodium chloride flush  3 mL Intravenous Q12H  . varenicline  1 mg Oral BID   Continuous Infusions: . azithromycin 500 mg (04/05/20 1057)  . cefTRIAXone (ROCEPHIN)  IV 2 g (04/05/20 1207)     LOS: 0 days   Time spent: 35 minutes   Darliss Cheney, MD [facility]  04/05/2020, 3:03 PM   To contact the attending provider between 7A-7P or the covering provider during after hours 7P-7A, please log into the web site [email or web address].

## 2020-04-06 LAB — BASIC METABOLIC PANEL
Anion gap: 7 (ref 5–15)
BUN: 11 mg/dL (ref 6–20)
CO2: 28 mmol/L (ref 22–32)
Calcium: 8.4 mg/dL — ABNORMAL LOW (ref 8.9–10.3)
Chloride: 105 mmol/L (ref 98–111)
Creatinine, Ser: 0.76 mg/dL (ref 0.44–1.00)
GFR calc Af Amer: >60 mL/min (ref >60)
GFR calc non Af Amer: >60 mL/min (ref >60)
Glucose, Bld: 107 mg/dL — ABNORMAL HIGH (ref 70–99)
Potassium: 4.1 mmol/L (ref 3.5–5.1)
Sodium: 140 mmol/L (ref 135–145)

## 2020-04-06 LAB — CBC WITH DIFFERENTIAL/PLATELET
Abs Immature Granulocytes: 0.07 10*3/uL (ref 0.00–0.07)
Basophils Absolute: 0.1 10*3/uL (ref 0.0–0.1)
Basophils Relative: 0 %
Eosinophils Absolute: 0.2 10*3/uL (ref 0.0–0.5)
Eosinophils Relative: 2 %
HCT: 31.1 % — ABNORMAL LOW (ref 36.0–46.0)
Hemoglobin: 7.9 g/dL — ABNORMAL LOW (ref 12.0–15.0)
Immature Granulocytes: 1 %
Lymphocytes Relative: 28 %
Lymphs Abs: 3.2 10*3/uL (ref 0.7–4.0)
MCH: 18.5 pg — ABNORMAL LOW (ref 26.0–34.0)
MCHC: 25.4 g/dL — ABNORMAL LOW (ref 30.0–36.0)
MCV: 72.7 fL — ABNORMAL LOW (ref 80.0–100.0)
Monocytes Absolute: 0.8 10*3/uL (ref 0.1–1.0)
Monocytes Relative: 7 %
Neutro Abs: 7 10*3/uL (ref 1.7–7.7)
Neutrophils Relative %: 62 %
Platelets: 379 10*3/uL (ref 150–400)
RBC: 4.28 MIL/uL (ref 3.87–5.11)
RDW: 21.5 % — ABNORMAL HIGH (ref 11.5–15.5)
WBC: 11.3 10*3/uL — ABNORMAL HIGH (ref 4.0–10.5)
nRBC: 0 % (ref 0.0–0.2)

## 2020-04-06 LAB — PROCALCITONIN: Procalcitonin: <0.1 ng/mL

## 2020-04-06 MED ORDER — IPRATROPIUM-ALBUTEROL 0.5-2.5 (3) MG/3ML IN SOLN
3.0000 mL | RESPIRATORY_TRACT | Status: DC | PRN
  Administered 2020-04-06: 3 mL via RESPIRATORY_TRACT
  Filled 2020-04-06: qty 3

## 2020-04-06 MED ORDER — FERROUS SULFATE 325 (65 FE) MG PO TBEC: 325.0000 mg | DELAYED_RELEASE_TABLET | Freq: Two times a day (BID) | ORAL | 1 refills | Status: DC

## 2020-04-06 MED ORDER — SODIUM CHLORIDE 0.9 % IV SOLN
510.0000 mg | Freq: Once | INTRAVENOUS | Status: AC
  Administered 2020-04-06: 510 mg via INTRAVENOUS
  Filled 2020-04-06: qty 17

## 2020-04-06 MED ORDER — AZITHROMYCIN 250 MG PO TABS
500.0000 mg | ORAL_TABLET | Freq: Every day | ORAL | Status: DC
  Administered 2020-04-06: 500 mg via ORAL
  Filled 2020-04-06: qty 2

## 2020-04-06 NOTE — Progress Notes (Signed)
SATURATION QUALIFICATIONS: (This note is used to comply with regulatory documentation for home oxygen)  Patient Saturations on Room Air at Rest =99%  Patient Saturations on Room Air while Ambulating = 97%  Pt tolerated ambulation well with reports of mild shortness of breath upon returning to room.

## 2020-04-06 NOTE — Discharge Summary (Signed)
Physician Discharge Summary  April Ellis IOX:[id number] DOB: 1976/03/31 DOA: 04/04/2020  PCP: [name], FNP  Admit date: 04/04/2020 Discharge date: 04/06/2020  Admitted From: Home Disposition: Home  Recommendations for Outpatient Follow-up:  1. Follow up with PCP in 1-2 weeks 2. Please obtain BMP/CBC in one week 3. Please follow up with your PCP on the following pending results: Unresulted Labs (From admission, onward) Comment          Start     Ordered   04/06/20 0500  CBC with Differential/Platelet  Daily,   R      04/05/20 1516   04/06/20 2683  Basic metabolic panel  Daily,   R      04/05/20 1516           Home Health: None Equipment/Devices: None  Discharge Condition: Stable CODE STATUS: Full code Diet recommendation: Cardiac  Subjective: Seen and examined.  Feels much better.  No shortness of breath or any pleuritic chest pain.  Ready to go home.  April [name] a 44 y.o.femalewith medical history significant ofasthma-COPD overlap followed by Dr. Phill Mutter pulmonology, menorrhagia, tobacco use, OSA, and morbid obesity presents after reported cardiac arrest related to possible respiratory arrest. History is obtained from review of records and from the patient. From what she can remember she reported feeling as though she could not breathe while driving in her car and complained of itching all over. She was able to call 911 and pull over. Upon EMS arrival patient reportedly complained of being unable to breathe before passing out andfalling out of the car. Patient reportedly lost pulses and had a PEA rhythm for which CPR was initiated. She reportedly received 5 minutes of CPR without epinephrine or shock given,and had return of spontaneous circulation. Patient reports having similar episodesin the past, but never at loss consciousness before or requiring CPR. Denies having any leg swelling, calf pain, chest pain, or recent sick contacts or  knowledge. She did note that she felt malaise for which she had taken prednisone and Benadryl this morning.Denies any new medications or coming into contact with anything out of the ordinary that would put her at risk for having allergic reaction. She reports having issues with acute onset of shortness of breath previously in the past but never had lost consciousness or passed out before.  ED Course:Upon admission into the emergency department patient was seen to betachycardic with O2 saturationscurrently maintained on 6 L nasal cannula oxygen. Labs significant for WBC 17.1, hemoglobin 11.2, andplatelets 516. Venous pH was noted to be within normal limits 7.291 with PCO2 51.2. Chest x-ray showed cardiomegaly with possibleinfiltrate in the right lower lobe. Patient had been given albuterol, epinephrine 0.3 mg, and 2 g of magnesiumsulfate. [facility] called to admit.  Brief/Interim Summary: Patient was admitted due to acute hypoxic respiratory failure.  She initially required nonrebreather and soon was tapered down to 6 L of oxygen.  As mentioned above, reportedly she had cardiopulmonary arrest requiring 5 minutes of CPR in the field.  No medications were used.  Initial chest x-ray showed possibility of right lower lobe pneumonia.  Due to syncope and hypoxia, CT angiogram of the chest was obtained which ruled out PE as well as pneumonia.  Despite of that, suspicion for pneumonia remained on top of the list due to her still requiring oxygen and some leukocytosis so she was continued on Rocephin and Zithromax but eventually procalcitonin levels were obtained which were unremarkable x2 so antibiotics were not indicated and  now patient has no shortness of breath and she is not even hypoxic.  She also was presumed to have acute asthma/COPD exacerbation for which she received some steroids here.  She was saturating over 95% on room air with exertion and at rest.  She also came in with microcytic anemia and was  found to have severe iron deficiency anemia.  She was already on [facility].  She received 1 dose of IV for him here.  Patient is walking around in the halls without needing any help.  I have counseled her regarding quitting smoking cigarettes.  Her leukocytosis has almost resolved.  She is completely stable so she is being discharged home.  Discharge Diagnoses:  Principal Problem:   Respiratory arrest ([place]) Active Problems:   Class 3 severe obesity due to excess calories without serious comorbidity with body mass index (BMI) of 60.0 to 69.9 in adult [facility])   Asthma-COPD overlap syndrome (HCC)   Tobacco abuse   Iron deficiency anemia   Leukocytosis   Acute respiratory failure with hypoxia (HCC)   Cardiac arrest (HCC)   Thrombocytosis (HCC)    Discharge Instructions   Allergies as of 04/06/2020      Reactions   Penicillins    Childhood Allergy Did it involve swelling of the face/tongue/throat, SOB, or low BP? UNK Did it involve sudden or severe rash/hives, skin peeling, or any reaction on the inside of your mouth or nose? UNK Did you need to seek medical attention at a hospital or doctor's office? UNK When did it last happen?more than 10 years If all above answers are "NO", may proceed with cephalosporin use.      Medication List    TAKE these medications   albuterol 108 (90 Base) MCG/ACT inhaler Commonly known as: VENTOLIN HFA Inhale 2 puffs into the lungs every 4 (four) hours as needed for wheezing or shortness of breath.   albuterol (2.5 MG/3ML) 0.083% nebulizer solution Commonly known as: PROVENTIL Take 3 mLs by nebulization 4 (four) times daily.   budesonide 1 MG/2ML nebulizer solution Commonly known as: PULMICORT Take 2 mLs by nebulization 4 (four) times daily as needed for wheezing.   budesonide-formoterol 160-4.5 MCG/ACT inhaler Commonly known as: Symbicort Inhale 2 puffs, then rinse mouth, twice daily   EPINEPHrine 0.3 mg/0.3 mL Soaj injection Commonly known  as: EPI-PEN Inject 0.3 mLs (0.3 mg total) into the muscle as needed for anaphylaxis.   Fasenra 30 MG/ML Sosy Generic drug: Benralizumab Inject 1 mL (30 mg total) into the skin every 8 (eight) weeks.   ferrous sulfate 325 (65 FE) MG EC tablet Take 1 tablet (325 mg total) by mouth in the morning and at bedtime. What changed: when to take this   Flonase Sensimist 27.5 MCG/SPRAY nasal spray Generic drug: fluticasone Place 2 sprays into the nose daily.   guaiFENesin 600 MG 12 hr tablet Commonly known as: MUCINEX Take 600 mg by mouth 2 (two) times daily as needed for cough.   ipratropium-albuterol 0.5-2.5 (3) MG/3ML Soln Commonly known as: DUONEB INHALE 1 VIAL BY MOUTH VIA NEBULIZER EVERY 6 HOURS AS NEEDED What changed:   how much to take  how to take this  when to take this  reasons to take this  additional instructions   montelukast 10 MG tablet Commonly known as: SINGULAIR TAKE 1 TABLET BY MOUTH EVERYDAY AT BEDTIME What changed: See the new instructions.   nicotine 21 mg/24hr patch Commonly known as: NICODERM CQ - dosed in mg/24 hours Place 1 patch (  21 mg total) onto the skin daily.   predniSONE 20 MG tablet Commonly known as: DELTASONE Take 20 mg by mouth taper from 4 doses each day to 1 dose and stop.   Saxenda 18 MG/3ML Sopn Generic drug: Liraglutide -Weight Management Inject 2.5 mg into the skin daily.   Spiriva HandiHaler 18 MCG inhalation capsule Generic drug: tiotropium INHALE 1 CAPSULE VIA HANDIHALER ONCE DAILY AT THE SAME TIME EVERY DAY What changed: See the new instructions.   varenicline 1 MG tablet Commonly known as: Chantix Continuing Month Pak Take 1 tablet (1 mg total) by mouth 2 (two) times daily.       Follow-up Information    [name], FNP Follow up in 1 week(s).   Specialty: General Practice Contact information: [place] [address] [phone]              Allergies  Allergen Reactions  .  Penicillins     Childhood Allergy Did it involve swelling of the face/tongue/throat, SOB, or low BP? UNK Did it involve sudden or severe rash/hives, skin peeling, or any reaction on the inside of your mouth or nose? UNK Did you need to seek medical attention at a hospital or doctor's office? UNK When did it last happen?more than 10 years If all above answers are "NO", may proceed with cephalosporin use.     Consultations: None   Procedures/Studies: CT Angio Chest PE W and/or Wo Contrast  Result Date: 04/04/2020 CLINICAL DATA:  Cardiac and respiratory arrest, history of asthma EXAM: CT ANGIOGRAPHY CHEST WITH CONTRAST TECHNIQUE: Multidetector CT imaging of the chest was performed using the standard protocol during bolus administration of intravenous contrast. Multiplanar CT image reconstructions and MIPs were obtained to evaluate the vascular anatomy. CONTRAST:  13mL OMNIPAQUE IOHEXOL 350 MG/ML SOLN COMPARISON:  04/04/2020 FINDINGS: Cardiovascular: This is a technically adequate evaluation of the pulmonary vasculature. No filling defects or pulmonary emboli. The heart is unremarkable without pericardial effusion. The thoracic aorta is normal in caliber with no aneurysm or dissection. Mediastinum/Nodes: No enlarged mediastinal, hilar, or axillary lymph nodes. Thyroid gland, trachea, and esophagus demonstrate no significant findings. Lungs/Pleura: Minimal hypoventilatory changes within the dependent lower lobes. No airspace disease, effusion, or pneumothorax. Central airways are patent. Upper Abdomen: No acute abnormality. Musculoskeletal: No acute or destructive bony lesions. Reconstructed images demonstrate no additional findings. Review of the MIP images confirms the above findings. IMPRESSION: 1. No evidence of pulmonary embolus. 2. No acute intrathoracic process. Electronically Signed   By: Randa Ngo M.D.   On: 04/04/2020 17:14   DG Chest Port 1 View  Result Date: 04/04/2020 CLINICAL  DATA:  Dizziness. EXAM: PORTABLE CHEST 1 VIEW COMPARISON:  10/19/2019. FINDINGS: Mediastinum and hilar structures normal. Cardiomegaly. No pulmonary venous congestion. Low lung volumes. Infiltrate in the right lung base cannot be excluded. No pleural effusion or pneumothorax. IMPRESSION: 1.  Cardiomegaly.  No pulmonary venous congestion. 2. Low lung volumes. Infiltrate in the right lung base cannot be excluded. Electronically Signed   By: Marcello Moores  Register   On: 04/04/2020 12:49      Discharge Exam: Vitals:   04/05/20 2321 04/06/20 0722  BP: 130/77   Pulse: 79   Resp: 16   Temp: 98.5 F (36.9 C)   SpO2: 98% 97%   Vitals:   04/05/20 1700 04/05/20 2020 04/05/20 2321 04/06/20 0722  BP: 127/63  130/77   Pulse: 79  79   Resp: 13  16   Temp: 98.3 F (  36.8 C)  98.5 F (36.9 C)   TempSrc: Oral  Oral   SpO2: 97% 95% 98% 97%  Weight:      Height:        General: Pt is alert, awake, not in acute distress, morbidly obese Cardiovascular: RRR, S1/S2 +, no rubs, no gallops Respiratory: CTA bilaterally, no wheezing, no rhonchi Abdominal: Soft, NT, ND, bowel sounds + Extremities: no edema, no cyanosis    The results of significant diagnostics from this hospitalization (including imaging, microbiology, ancillary and laboratory) are listed below for reference.     Microbiology: Recent Results (from the past 240 hour(s))  SARS Coronavirus 2 by RT PCR (hospital order, performed in [facility] hospital lab) Nasopharyngeal Nasopharyngeal Swab     Status: None   Collection Time: 04/04/20 12:14 PM   Specimen: Nasopharyngeal Swab  Result Value Ref Range Status   SARS Coronavirus 2 NEGATIVE NEGATIVE Final    Comment: (NOTE) SARS-CoV-2 target nucleic acids are NOT DETECTED.  The SARS-CoV-2 RNA is generally detectable in upper and lower respiratory specimens during the acute phase of infection. The lowest concentration of SARS-CoV-2 viral copies this assay can detect is 250 copies / mL. A  negative result does not preclude SARS-CoV-2 infection and should not be used as the sole basis for treatment or other patient management decisions.  A negative result may occur with improper specimen collection / handling, submission of specimen other than nasopharyngeal swab, presence of viral mutation(s) within the areas targeted by this assay, and inadequate number of viral copies (<250 copies / mL). A negative result must be combined with clinical observations, patient history, and epidemiological information.  Fact Sheet for Patients:   StrictlyIdeas.no  Fact Sheet for Healthcare Providers: BankingDealers.co.za  This test is not yet approved or  cleared by the [place] FDA and has been authorized for detection and/or diagnosis of SARS-CoV-2 by FDA under an Emergency Use Authorization (EUA).  This EUA will remain in effect (meaning this test can be used) for the duration of the COVID-19 declaration under Section 564(b)(1) of the Act, 21 U.S.C. section 360bbb-3(b)(1), unless the authorization is terminated or revoked sooner.  Performed at [facility], [place] [address] [place], [place] [address]      Labs: BNP (last 3 results) No results for input(s): BNP in the last 8760 hours. Basic Metabolic Panel: Recent Labs  Lab 04/04/20 1212 04/04/20 1241 04/05/20 0356 04/06/20 0236  NA 138 140 140 140  K 4.0 4.0 4.1 4.1  CL 104  --  104 105  CO2 20*  --  29 28  GLUCOSE 189*  --  124* 107*  BUN 12  --  8 11  CREATININE 0.93  --  0.74 0.76  CALCIUM 8.3*  --  8.5* 8.4*   Liver Function Tests: Recent Labs  Lab 04/04/20 1705  AST 22  ALT 16  ALKPHOS 68  BILITOT 0.5  PROT 7.2  ALBUMIN 3.0*   No results for input(s): LIPASE, AMYLASE in the last 168 hours. No results for input(s): AMMONIA in the last 168 hours. CBC: Recent Labs  Lab 04/04/20 1212 04/04/20 1241 04/05/20 0356 04/06/20 0236  WBC 17.1*  --   16.9* 11.3*  NEUTROABS 12.3*  --   --  7.0  HGB 8.7* 11.2* 7.8* 7.9*  HCT 33.7* 33.0* 30.3* 31.1*  MCV 72.9*  --  72.3* 72.7*  PLT 516*  --  383 379   Cardiac Enzymes: No results for input(s): CKTOTAL, CKMB,  CKMBINDEX, TROPONINI in the last 168 hours. BNP: Invalid input(s): POCBNP CBG: No results for input(s): GLUCAP in the last 168 hours. D-Dimer No results for input(s): DDIMER in the last 72 hours. Hgb A1c No results for input(s): HGBA1C in the last 72 hours. Lipid Profile No results for input(s): CHOL, HDL, LDLCALC, TRIG, CHOLHDL, LDLDIRECT in the last 72 hours. Thyroid function studies No results for input(s): TSH, T4TOTAL, T3FREE, THYROIDAB in the last 72 hours.  Invalid input(s): FREET3 Anemia work up Recent Labs    04/05/20 0356  FERRITIN 2*  TIBC 388  IRON 13*   Urinalysis    Component Value Date/Time   COLORURINE YELLOW 04/09/2014 2007   APPEARANCEUR CLEAR 04/09/2014 2007   LABSPEC 1.015 04/09/2014 2007   PHURINE 7.0 04/09/2014 2007   GLUCOSEU NEGATIVE 04/09/2014 2007   GLUCOSEU NEGATIVE 12/19/2008 1343   HGBUR NEGATIVE 04/09/2014 2007   BILIRUBINUR negative 11/26/2019 1728   KETONESUR NEGATIVE 04/09/2014 2007   PROTEINUR Negative 11/26/2019 1728   PROTEINUR NEGATIVE 04/09/2014 2007   UROBILINOGEN 1.0 11/26/2019 1728   UROBILINOGEN 1.0 04/09/2014 2007   NITRITE negative 11/26/2019 1728   NITRITE NEGATIVE 04/09/2014 2007   LEUKOCYTESUR Small (1+) (A) 11/26/2019 1728   Sepsis Labs Invalid input(s): PROCALCITONIN,  WBC,  LACTICIDVEN Microbiology Recent Results (from the past 240 hour(s))  SARS Coronavirus 2 by RT PCR (hospital order, performed in [place] hospital lab) Nasopharyngeal Nasopharyngeal Swab     Status: None   Collection Time: 04/04/20 12:14 PM   Specimen: Nasopharyngeal Swab  Result Value Ref Range Status   SARS Coronavirus 2 NEGATIVE NEGATIVE Final    Comment: (NOTE) SARS-CoV-2 target nucleic acids are NOT DETECTED.  The SARS-CoV-2  RNA is generally detectable in upper and lower respiratory specimens during the acute phase of infection. The lowest concentration of SARS-CoV-2 viral copies this assay can detect is 250 copies / mL. A negative result does not preclude SARS-CoV-2 infection and should not be used as the sole basis for treatment or other patient management decisions.  A negative result may occur with improper specimen collection / handling, submission of specimen other than nasopharyngeal swab, presence of viral mutation(s) within the areas targeted by this assay, and inadequate number of viral copies (<250 copies / mL). A negative result must be combined with clinical observations, patient history, and epidemiological information.  Fact Sheet for Patients:   StrictlyIdeas.no  Fact Sheet for Healthcare Providers: BankingDealers.co.za  This test is not yet approved or  cleared by the [place] FDA and has been authorized for detection and/or diagnosis of SARS-CoV-2 by FDA under an Emergency Use Authorization (EUA).  This EUA will remain in effect (meaning this test can be used) for the duration of the COVID-19 declaration under Section 564(b)(1) of the Act, 21 U.S.C. section 360bbb-3(b)(1), unless the authorization is terminated or revoked sooner.  Performed at [facility], [place] [address], [place], [place] [address]      Time coordinating discharge: Over 30 minutes  SIGNED:   Darliss Cheney, MD  [facility] 04/06/2020, 12:56 PM  If 7PM-7AM, please contact night-coverage [email or web address]

## 2020-04-06 NOTE — Discharge Instructions (Signed)
Allergies, Adult An allergy means that your body reacts to something that bothers it (allergen). It is not a normal reaction. This can happen from something that you:  Eat.  Breathe in.  Touch. You can have an allergy (be allergic) to:  Outdoor things, like: ? Pollen. ? Grass. ? Weeds.  Indoor things, like: ? Dust. ? Smoke. ? Pet dander.  Foods.  Medicines.  Things that bother your skin, like: ? Detergents. ? Chemicals. ? Latex.  Perfume.  Bugs. An allergy cannot spread from person to person (is not contagious). Follow these instructions at home:         Stay away from things that you know you are allergic to.  If you have allergies to things in the air, wash out your nose each day. Do it with one of these: ? A salt-water (saline) spray. ? A container (neti pot).  Take over-the-counter and prescription medicines only as told by your doctor.  Keep all follow-up visits as told by your doctor. This is important.  If you are at risk for a very bad allergy reaction (anaphylaxis), keep an auto-injector with you all the time. This is called an epinephrine injection. ? This is pre-measured medicine with a needle. You can put it into your skin by yourself. ? Right after you have a very bad allergy reaction, you or a person with you must give the medicine in less than a few minutes. This is an emergency.  If you have ever had a very bad allergy reaction, wear a medical alert bracelet or necklace. Your very bad allergy should be written on it. Contact a health care provider if:  Your symptoms do not get better with treatment. Get help right away if:  You have symptoms of a very bad allergy reaction. These include: ? A swollen mouth, tongue, or throat. ? Pain or tightness in your chest. ? Trouble breathing. ? Being short of breath. ? Dizziness. ? Fainting. ? Very bad pain in your belly (abdomen). ? Throwing up (vomiting). ? Watery poop  (diarrhea). Summary  An allergy means that your body reacts to something that bothers it (allergen). It is not a normal reaction.  Stay away from things that make your body react.  Take over-the-counter and prescription medicines only as told by your doctor.  If you are at risk for a very bad allergy reaction, carry an auto-injector (epinephrine injection) all the time. Also, wear a medical alert bracelet or necklace so people know about your allergy. This information is not intended to replace advice given to you by your health care provider. Make sure you discuss any questions you have with your health care provider. Document Revised: 01/02/2019 Document Reviewed: 12/27/2016 Elsevier Patient Education  2020 Elsevier Inc.  

## 2020-04-06 NOTE — Progress Notes (Signed)
PHARMACIST - PHYSICIAN COMMUNICATION DR:  April Ellis CONCERNING: Antibiotic IV to Oral Route Change Policy  RECOMMENDATION: This patient is receiving azithromycin by the intravenous route.  Based on criteria approved by the Pharmacy and Therapeutics Committee, the antibiotic is being converted to the equivalent oral dose form   DESCRIPTION: These criteria include:  Patient being treated for a respiratory tract infection, urinary tract infection, cellulitis or clostridium difficile associated diarrhea if on metronidazole  The patient is not neutropenic and does not exhibit a GI malabsorption state  The patient is eating (either orally or via tube) and/or has been taking other orally administered medications for a least 24 hours  The patient is improving clinically and has a Tmax < 100.5  If you have questions about this conversion, please contact the Pharmacy Department  []   [phone] )  April Ellis [x]   [phone] )  April Ellis  []   [phone] )  [facility] []   [phone] )  [name]

## 2020-04-07 ENCOUNTER — Other Ambulatory Visit: Payer: Self-pay | Admitting: Internal Medicine

## 2020-04-07 DIAGNOSIS — D649 Anemia, unspecified: Secondary | ICD-10-CM | POA: Diagnosis not present

## 2020-04-09 ENCOUNTER — Encounter: Payer: Self-pay | Admitting: Nurse Practitioner

## 2020-04-09 ENCOUNTER — Telehealth: Payer: Self-pay

## 2020-04-09 ENCOUNTER — Other Ambulatory Visit: Payer: Self-pay

## 2020-04-09 ENCOUNTER — Ambulatory Visit (INDEPENDENT_AMBULATORY_CARE_PROVIDER_SITE_OTHER): Payer: BC Managed Care – PPO | Admitting: Nurse Practitioner

## 2020-04-09 VITALS — BP 128/84 | HR 62 | Temp 98.4°F | Ht 61.6 in | Wt 345.4 lb

## 2020-04-09 DIAGNOSIS — Z1159 Encounter for screening for other viral diseases: Secondary | ICD-10-CM

## 2020-04-09 DIAGNOSIS — T7840XD Allergy, unspecified, subsequent encounter: Secondary | ICD-10-CM | POA: Diagnosis not present

## 2020-04-09 DIAGNOSIS — I469 Cardiac arrest, cause unspecified: Secondary | ICD-10-CM | POA: Diagnosis not present

## 2020-04-09 NOTE — Telephone Encounter (Signed)
Transition Care Management Follow-up Telephone Call  Date of discharge and from where: 04/06/20 [place]  How have you been since you were released from the hospital? She has been doing ok  Any questions or concerns? yes  Items Reviewed:  Did the pt receive and understand the discharge instructions provided?yes  Medications obtained and verified?yes   Any new allergies since your discharge? no  Dietary orders reviewed? no  Do you have support at home? yes  Other (ie: DME, Home Health, etc)no    Functional Questionnaire: (I = Independent and D = Dependent)  Bathing/Dressing- I   Meal Prep- I  Eating- I  Maintaining continence I  Transferring/Ambulation- I  Managing Meds- I   Follow up appointments reviewed:    PCP Hospital f/u appt confirmed?  scheduled to see [name] DNP,FNP-BC on 04/09/20 @ [facility] f/u appt confirmed?  none  Are transportation arrangements needed? no  If their condition worsens, is the pt aware to call  their PCP or go to the ED? yes  Was the patient provided with contact information for the PCP's office or ED? yes  Was the pt encouraged to call back with questions or concerns? yes
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
## 2020-04-10 ENCOUNTER — Other Ambulatory Visit (HOSPITAL_COMMUNITY): Payer: Self-pay

## 2020-04-10 ENCOUNTER — Other Ambulatory Visit: Payer: Self-pay | Admitting: Internal Medicine

## 2020-04-10 DIAGNOSIS — G4733 Obstructive sleep apnea (adult) (pediatric): Secondary | ICD-10-CM

## 2020-04-11 ENCOUNTER — Other Ambulatory Visit: Payer: Self-pay

## 2020-04-11 ENCOUNTER — Ambulatory Visit (HOSPITAL_COMMUNITY)
Admission: RE | Admit: 2020-04-11 | Discharge: 2020-04-11 | Disposition: A | Discharge status: Home / Self Care | Payer: BC Managed Care – PPO | Source: Ambulatory Visit | Attending: Obstetrics and Gynecology | Admitting: Obstetrics and Gynecology

## 2020-04-11 DIAGNOSIS — D509 Iron deficiency anemia, unspecified: Secondary | ICD-10-CM | POA: Insufficient documentation

## 2020-04-11 MED ORDER — SODIUM CHLORIDE 0.9 % IV SOLN
510.0000 mg | Freq: Once | INTRAVENOUS | Status: AC
  Administered 2020-04-11: 510 mg via INTRAVENOUS
  Filled 2020-04-11: qty 17

## 2020-04-14 ENCOUNTER — Other Ambulatory Visit: Payer: BC Managed Care – PPO

## 2020-04-14 ENCOUNTER — Other Ambulatory Visit: Payer: Self-pay

## 2020-04-14 ENCOUNTER — Telehealth: Payer: Self-pay | Admitting: Internal Medicine

## 2020-04-14 DIAGNOSIS — Z1159 Encounter for screening for other viral diseases: Secondary | ICD-10-CM | POA: Diagnosis not present

## 2020-04-14 DIAGNOSIS — I469 Cardiac arrest, cause unspecified: Secondary | ICD-10-CM | POA: Diagnosis not present

## 2020-04-14 DIAGNOSIS — T7840XD Allergy, unspecified, subsequent encounter: Secondary | ICD-10-CM | POA: Diagnosis not present

## 2020-04-14 DIAGNOSIS — J4541 Moderate persistent asthma with (acute) exacerbation: Secondary | ICD-10-CM

## 2020-04-14 DIAGNOSIS — J449 Chronic obstructive pulmonary disease, unspecified: Secondary | ICD-10-CM

## 2020-04-14 DIAGNOSIS — G4733 Obstructive sleep apnea (adult) (pediatric): Secondary | ICD-10-CM

## 2020-04-14 MED ORDER — IPRATROPIUM-ALBUTEROL 0.5-2.5 (3) MG/3ML IN SOLN: RESPIRATORY_TRACT | 12 refills | Status: DC

## 2020-04-14 NOTE — Telephone Encounter (Signed)
Will insurance cover a new HST, or is it too soon?

## 2020-04-14 NOTE — Telephone Encounter (Signed)
Please see other encounter from today 7/19 as this is a duplicate message.

## 2020-04-14 NOTE — Telephone Encounter (Signed)
Dr Annamaria Boots- DME [facility] reports that the pt has been noncompliant and needs to have a sleep study in order for DME to pay for CPAP supplies  Please advise, thanks

## 2020-04-14 NOTE — Telephone Encounter (Signed)
This will require an order to be placed.

## 2020-04-14 NOTE — Telephone Encounter (Signed)
PCC's can you please advise on Dr Janee Morn ? Thanks!

## 2020-04-14 NOTE — Telephone Encounter (Signed)
HST was denied by pt's insurance since patient had HST in July 2020 and CPAP Titration in Sept 2020.  Usually if the patient is non-compliant the DME will pick up the equipment. If patient owns the CPAP she may be able to try another DME.  I can reach out to another DME if Dr. Sherrill Raring would like to pursue this option.   Please advise. Rhonda J Cobb

## 2020-04-14 NOTE — Telephone Encounter (Signed)
Will her insurance cover an HST now, or are they saying it is too soon?

## 2020-04-14 NOTE — Telephone Encounter (Signed)
Per [employer], pt was non-compliant with CPAP & will now require another sleep study. In April of this year, pt's insurance would not cover the cost of a sleep study.  Please advise...   Eduardo Osier D sent to [facility] Counts, [place] Patient was non compliant she needs to have a new sleep study or titration   Estill Bamberg      Previous Messages   ----- Message -----  From: Vella Kohler D  Sent: 04/14/2020 11:59 AM EDT  To: Mauri Brooklyn  Subject: RE: cpap supplies                 Good Morning.   The patient completed the sleep study on 04/17/2019 as indicated on the referral/order & listed under procedures in epic.    Thank you,  Earnest Bailey   ----- Message -----  From: Cletis Athens  Sent: 04/14/2020 10:27 AM EDT  To: Alyse Low  Subject: RE: cpap supplies                 Patient hasn't had the HST so we can not complete the order we need the HST completed and then we can pull   Estill Bamberg   ----- Message -----  From: Satira Anis  Sent: 04/10/2020 11:36 AM EDT  To: Mauri Brooklyn  Subject: cpap supplies                   DOB: 1976/01/20   Order placed by Dr. Annamaria Boots. Please advise.   Thank you,  [facility]

## 2020-04-14 NOTE — Telephone Encounter (Signed)
Called and spoke with Patient. Patient stated she rented her previous cpap machine, never owed a cpap.  Patient aware cpap/HST status, at this time.  Patient requested refill of Duo Nebs to [facility]. Duo Nebs prescription sent to requested pharmacy.  Message routed to Dr Annamaria Boots

## 2020-04-14 NOTE — Telephone Encounter (Signed)
Per [employer], pt was non-compliant with CPAP & will now require another sleep study. In April of this year, pt's insurance would not cover the cost of a sleep study.  Please advise...   Eduardo Osier D sent to Nadine Counts, [place] Patient was non compliant she needs to have a new sleep study or titration   Estill Bamberg       Previous Messages   ----- Message -----  From: Vella Kohler D  Sent: 04/14/2020 11:59 AM EDT  To: Mauri Brooklyn  Subject: RE: cpap supplies                 Good Morning.   The patient completed the sleep study on 04/17/2019 as indicated on the referral/order & listed under procedures in epic.    Thank you,  Earnest Bailey   ----- Message -----  From: Cletis Athens  Sent: 04/14/2020 10:27 AM EDT  To: Alyse Low  Subject: RE: cpap supplies                 Patient hasn't had the HST so we can not complete the order we need the HST completed and then we can pull   Estill Bamberg   ----- Message -----  From: Satira Anis  Sent: 04/10/2020 11:36 AM EDT  To: Mauri Brooklyn  Subject: cpap supplies                   DOB: May 15, 1976   Order placed by Dr. Annamaria Boots. Please advise.   Thank you,  [facility]

## 2020-04-16 LAB — ALLERGENS(96) FOODS
Allergen Apple, IgE: <0.1 kU/L
Allergen Banana IgE: <0.1 kU/L
Allergen Barley IgE: <0.1 kU/L
Allergen Black Pepper IgE: <0.1 kU/L
Allergen Blueberry IgE: <0.1 kU/L
Allergen Broccoli: <0.1 kU/L
Allergen Cabbage IgE: <0.1 kU/L
Allergen Carrot IgE: <0.1 kU/L
Allergen Cauliflower IgE: <0.1 kU/L
Allergen Celery IgE: <0.1 kU/L
Allergen Cinnamon IgE: <0.1 kU/L
Allergen Coconut IgE: <0.1 kU/L
Allergen Corn, IgE: <0.1 kU/L
Allergen Cucumber IgE: <0.1 kU/L
Allergen Garlic IgE: <0.1 kU/L
Allergen Ginger IgE: <0.1 kU/L
Allergen Gluten IgE: <0.1 kU/L
Allergen Grape IgE: <0.1 kU/L
Allergen Grapefruit IgE: <0.1 kU/L
Allergen Green Bean IgE: <0.1 kU/L
Allergen Green Bell Pepper IgE: <0.1 kU/L
Allergen Green Pea IgE: <0.1 kU/L
Allergen Lamb IgE: <0.1 kU/L
Allergen Lettuce IgE: <0.1 kU/L
Allergen Lime IgE: <0.1 kU/L
Allergen Melon IgE: <0.1 kU/L
Allergen Oat IgE: <0.1 kU/L
Allergen Onion IgE: <0.1 kU/L
Allergen Pear IgE: <0.1 kU/L
Allergen Potato, White IgE: <0.1 kU/L
Allergen Rice IgE: <0.1 kU/L
Allergen Salmon IgE: <0.1 kU/L
Allergen Strawberry IgE: <0.1 kU/L
Allergen Sweet Potato IgE: <0.1 kU/L
Allergen Tomato, IgE: <0.1 kU/L
Allergen Turkey IgE: <0.1 kU/L
Allergen Watermelon IgE: <0.1 kU/L
Allergen, Peach f95: <0.1 kU/L
Basil: <0.1 kU/L
Beef IgE: <0.1 kU/L
C074-IgE Gelatin: <0.1 kU/L
Chicken IgE: <0.1 kU/L
Chocolate/Cacao IgE: <0.1 kU/L
Clam IgE: <0.1 kU/L
Codfish IgE: <0.1 kU/L
Coffee: <0.1 kU/L
Cranberry IgE: <0.1 kU/L
Egg White IgE: <0.1 kU/L
F020-IgE Almond: <0.1 kU/L
F023-IgE Crab: <0.1 kU/L
F045-IgE Yeast: <0.1 kU/L
F076-IgE Alpha Lactalbumin: <0.1 kU/L
F077-IgE Beta Lactoglobulin: <0.1 kU/L
F078-IgE Casein: <0.1 kU/L
F080-IgE Lobster: <0.1 kU/L
F081-IgE Cheese, Cheddar Type: <0.1 kU/L
F089-IgE Mustard: <0.1 kU/L
F096-IgE Avocado: <0.1 kU/L
F202-IgE Cashew Nut: <0.1 kU/L
F214-IgE Spinach: <0.1 kU/L
F222-IgE Tea: <0.1 kU/L
F242-IgE Bing Cherry: <0.1 kU/L
F247-IgE Honey: <0.1 kU/L
F261-IgE Asparagus: <0.1 kU/L
F262-IgE Eggplant: <0.1 kU/L
F265-IgE Cumin: <0.1 kU/L
F278-IgE Bayleaf (Laurel): <0.1 kU/L
F279-IgE Chili Pepper: <0.1 kU/L
F283-IgE Oregano: <0.1 kU/L
F300-IgE Goat's Milk: <0.1 kU/L
F342-IgE Olive, Black: <0.1 kU/L
F343-IgE Raspberry: <0.1 kU/L
Hops: <0.1 kU/L
IgE Egg (Yolk): <0.1 kU/L
Kidney Bean IgE: <0.1 kU/L
Lemon: <0.1 kU/L
Lima Bean IgE: <0.1 kU/L
Malt: <0.1 kU/L
Mushroom IgE: <0.1 kU/L
Orange: <0.1 kU/L
Peanut IgE: <0.1 kU/L
Pineapple IgE: 0.13 kU/L — AB
Pork IgE: <0.1 kU/L
Pumpkin IgE: <0.1 kU/L
Red Beet: <0.1 kU/L
Rye IgE: <0.1 kU/L
Scallop IgE: <0.1 kU/L
Sesame Seed IgE: <0.1 kU/L
Shrimp IgE: <0.1 kU/L
Soybean IgE: <0.1 kU/L
Tuna: <0.1 kU/L
Vanilla: <0.1 kU/L
Walnut IgE: <0.1 kU/L
Wheat IgE: <0.1 kU/L
Whey: <0.1 kU/L
White Bean IgE: <0.1 kU/L

## 2020-04-16 LAB — BMP8+EGFR
BUN/Creatinine Ratio: 14 (ref 9–23)
BUN: 11 mg/dL (ref 6–24)
CO2: 22 mmol/L (ref 20–29)
Calcium: 9.1 mg/dL (ref 8.7–10.2)
Chloride: 106 mmol/L (ref 96–106)
Creatinine, Ser: 0.78 mg/dL (ref 0.57–1.00)
GFR calc Af Amer: 108 mL/min/{1.73_m2} (ref >59)
GFR calc non Af Amer: 94 mL/min/{1.73_m2} (ref >59)
Glucose: 110 mg/dL — ABNORMAL HIGH (ref 65–99)
Potassium: 3.7 mmol/L (ref 3.5–5.2)
Sodium: 144 mmol/L (ref 134–144)

## 2020-04-16 LAB — CBC WITH DIFFERENTIAL/PLATELET
Basophils Absolute: 0.1 10*3/uL (ref 0.0–0.2)
Basos: 1 %
EOS (ABSOLUTE): 0.2 10*3/uL (ref 0.0–0.4)
Eos: 1 %
Hematocrit: 38.2 % (ref 34.0–46.6)
Hemoglobin: 10.4 g/dL — ABNORMAL LOW (ref 11.1–15.9)
Immature Grans (Abs): 0.2 10*3/uL — ABNORMAL HIGH (ref 0.0–0.1)
Immature Granulocytes: 1 %
Lymphocytes Absolute: 1.7 10*3/uL (ref 0.7–3.1)
Lymphs: 12 %
MCH: 20.4 pg — ABNORMAL LOW (ref 26.6–33.0)
MCHC: 27.2 g/dL — ABNORMAL LOW (ref 31.5–35.7)
MCV: 75 fL — ABNORMAL LOW (ref 79–97)
Monocytes Absolute: 0.5 10*3/uL (ref 0.1–0.9)
Monocytes: 3 %
Neutrophils Absolute: 11.7 10*3/uL — ABNORMAL HIGH (ref 1.4–7.0)
Neutrophils: 82 %
Platelets: 358 10*3/uL (ref 150–450)
RBC: 5.1 x10E6/uL (ref 3.77–5.28)
RDW: 24.7 % — ABNORMAL HIGH (ref 11.7–15.4)
WBC: 14.3 10*3/uL — ABNORMAL HIGH (ref 3.4–10.8)

## 2020-04-16 LAB — HEPATITIS C ANTIBODY: Hep C Virus Ab: <0.1 s/co ratio (ref 0.0–0.9)

## 2020-04-17 NOTE — Telephone Encounter (Signed)
We need to order new HST or Split night study if at all possible, so we can try to get her restarted on CPAP. Her machine was taken away due to non-compliance.

## 2020-04-17 NOTE — Telephone Encounter (Signed)
OK I ordered HST per Dr Janee Morn request. Please let us know if insurance denies.

## 2020-04-17 NOTE — Telephone Encounter (Signed)
Dr. Annamaria Boots, please advise if you want [place] to pursue ordering another sleep study for pt or what you recommend.

## 2020-04-18 NOTE — Telephone Encounter (Signed)
Called [facility] at [phone] and spoke with [place].  Case started for HST procedure code [phone].  Did not meet guidelines for HST.  Information sent to [facility] at [phone] attn: Nurse Reviewer.  Case will close on 04/24/2020 at 6:00 pm CST.   Records sent requesting repeat HST.  Will keep phone note open so that I can check on status of request on Tues 04/22/2020. Rhonda J Cobb

## 2020-04-22 ENCOUNTER — Telehealth: Payer: Self-pay

## 2020-04-22 NOTE — Telephone Encounter (Signed)
This is not a Peer to Peer that you can schedule the provider just has to call when they have the time  Pt has an appt on 08/03 I will speak to Tammy Parrett about it

## 2020-04-22 NOTE — Telephone Encounter (Signed)
Patient called asking if she needed another dose of prednisone due to her not finishing her taper dose and also wanted to know about her labs. I advised her the medication will be sent to pharmacy since she is still having some chest tightness also pt advised her that her WBC an Hemoglobin Is improving and she has a low sensitivity to pineapple. YL,RMA

## 2020-04-22 NOTE — Telephone Encounter (Signed)
Called [facility] and spoke with Herbie [place].  Clinical information received. More information is still needed.   A peer to peer is being requested by [facility].  Contact [facility] at [phone] and follow prompts.    Will need Members Name, DOB and Ins ID # [id number] and procedure code [phone].    This will need to be done in order to proceed with HST. Rhonda J Cobb

## 2020-04-22 NOTE — Telephone Encounter (Signed)
April Ellis, please advise if you know yet if pt is able to have a HST or if pt does need to have a split night study performed?

## 2020-04-23 ENCOUNTER — Other Ambulatory Visit: Payer: Self-pay

## 2020-04-23 MED ORDER — PREDNISONE 20 MG PO TABS: ORAL_TABLET | ORAL | 0 refills | Status: DC

## 2020-04-28 NOTE — Telephone Encounter (Signed)
Papers are at my place in cpod I have tried to call [facility] twice- got VM "all of our associates are busy, please hold"  then phone just rings. -CDY

## 2020-04-28 NOTE — Telephone Encounter (Signed)
NB- also- she was just in hospital with cardiopulmonary arrest, possibly obesity hypoventilation. 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 She should have an in-center split night study with cpap to bipap titration if we ever get approval.

## 2020-04-28 NOTE — Telephone Encounter (Signed)
I believe I gave these papers for the peer to peer to Dr. Annamaria Boots.

## 2020-04-28 NOTE — Telephone Encounter (Signed)
Order placed for split night with cpap to bipap titration. Will route message to [facility] to assist

## 2020-04-28 NOTE — Telephone Encounter (Signed)
Papers to call and speak to insurance was givin to Dr Annamaria Boots Nurse

## 2020-04-29 ENCOUNTER — Other Ambulatory Visit: Payer: Self-pay

## 2020-04-29 ENCOUNTER — Ambulatory Visit (INDEPENDENT_AMBULATORY_CARE_PROVIDER_SITE_OTHER): Payer: BC Managed Care – PPO | Admitting: Adult Health

## 2020-04-29 ENCOUNTER — Encounter: Payer: Self-pay | Admitting: Adult Health

## 2020-04-29 VITALS — BP 118/74 | HR 94 | Temp 97.9°F | Ht 60.0 in | Wt 360.6 lb

## 2020-04-29 DIAGNOSIS — Z1231 Encounter for screening mammogram for malignant neoplasm of breast: Secondary | ICD-10-CM | POA: Diagnosis not present

## 2020-04-29 DIAGNOSIS — L509 Urticaria, unspecified: Secondary | ICD-10-CM

## 2020-04-29 DIAGNOSIS — J449 Chronic obstructive pulmonary disease, unspecified: Secondary | ICD-10-CM

## 2020-04-29 DIAGNOSIS — J4489 Other specified chronic obstructive pulmonary disease: Secondary | ICD-10-CM

## 2020-04-29 DIAGNOSIS — I469 Cardiac arrest, cause unspecified: Secondary | ICD-10-CM

## 2020-04-29 MED ORDER — ALBUTEROL SULFATE HFA 108 (90 BASE) MCG/ACT IN AERS: 2.0000 | INHALATION_SPRAY | RESPIRATORY_TRACT | 5 refills | Status: DC | PRN

## 2020-04-29 NOTE — Patient Instructions (Addendum)
Continue on Symbicort  2 puffs Twice daily  , rinse after use.  Continue on Spiriva 1 puff daily , rinse after use.  Continue on Albuterol inhaler or Neb As needed   Continue on Singulair and Flonase daily  Add Zyrtec 10mg  At bedtime   Refer to cardiology for syncope/cardiac arrest.  Continue to work on quitting smoking  Continue on Chantix as prescribed.  Activity as tolerated.  Check on Fasenra update.  Follow up with Allergist as planned  Follow up with Dr. Annamaria Boots  In 4-6 weeks and As needed   Please contact office for sooner follow up if symptoms do not improve or worsen or seek emergency care

## 2020-04-29 NOTE — Progress Notes (Signed)
@Patient  ID: April Ellis, female    DOB: 06-17-1976, 44 y.o.   MRN: [id number]  Chief Complaint  Patient presents with  . Follow-up    Referring provider: [name], FNP  HPI: 44 year old female  smoker for asthma/COPD overlap syndrome, chronic allergies/urticaria.  Chronic iron deficiency anemia  TEST/EVENTS :  Office spirometry 2017 by [place] office- mild obstruction PFT 05/01/14- Severe obstruction. 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 IgE 234,  EOS low on repeated steroid bursts (04/04/19)  04/29/2020 Follow up : Asthma/COPD/chronic allergies, post hospital follow-up Patient returns for a post hospital follow-up.  Patient was admitted last month for acute respiratory failure with cardiopulmonary arrest.  According to hospital records patient called with respiratory distress upon EMS will patient had a witnessed sewed with loss of pulses and breathing CPR was initiated with 5 minutes of CPR with return of spontaneous breathing and circulation.  She did not require epinephrine or defibrillation.  Upon arrival to the emergency room she was found to be hypoxic requiring 6 L of oxygen and tachycardic.  White blood cell count was elevated at 17,000.  Venous pH was 7.291 and PCO2 was 51.  Chest x-ray showed possible infiltrates in the right lower lobe.  CT chest was negative for PE and pneumonia.  She was treated with empiric antibiotics briefly.  She was continued on treatment for asthma exacerbation with IV steroids.  She also was found to be severely anemic with iron deficiency anemia.  And received 1 dose of IV iron during hospitalization.  She was counseled on smoking cessation. Since discharge patient says she is feeling better.  She remains weak and gets short of breath is maintained on 4 and Spiriva.   She is on Singulair and Flonase daily.  She is trying to cut back on smoking.  She is currently on Chantix.  She has been changed from [place] to [place].  According to our team she is ready to begin Berna Bue as soon as she can make an appointment.  She does have follow-up with her allergist. She denies any hemoptysis chest pain orthopnea PND or syncopal episodes   Allergies  Allergen Reactions  . Penicillins     Childhood Allergy Did it involve swelling of the face/tongue/throat, SOB, or low BP? UNK Did it involve sudden or severe rash/hives, skin peeling, or any reaction on the inside of your mouth or nose? UNK Did you need to seek medical attention at a hospital or doctor's office? UNK When did it last happen?more than 10 years If all above answers are "NO", may proceed with cephalosporin use.     Immunization History  Administered Date(s) Administered  . Influenza, Quadrivalent, Recombinant, Inj, Pf 09/06/2019  . PFIZER SARS-COV-2 Vaccination 12/05/2019, 12/26/2019  . Td 12/19/2008    Past Medical History:  Diagnosis Date  . Anemia   . Asthma   . Bronchitis   . COPD (chronic obstructive pulmonary disease) ([place])   . Dyspnea    with asthma flair  . Obesity     Tobacco History: Social History   Tobacco Use  Smoking Status Light Tobacco Smoker  . Packs/day: 0.25  . Years: 15.00  . Pack years: 3.75  . Types: Cigarettes  . Last attempt to quit: 09/16/2014  . Years since quitting: 5.6  Smokeless Tobacco Never Used  Ready to quit: Not Answered Counseling given: Not Answered   Outpatient Medications Prior to Visit  Medication Sig Dispense Refill  . albuterol (PROVENTIL) (2.5 MG/3ML) 0.083% nebulizer solution Take 3 mLs by nebulization 4 (four) times daily.    Marland Kitchen albuterol (VENTOLIN HFA) 108 (90 Base) MCG/ACT inhaler Inhale 2 puffs into the lungs every 4 (four) hours as needed for wheezing or shortness of breath. 8 g 5  . Benralizumab (FASENRA) 30 MG/ML SOSY  Inject 1 mL (30 mg total) into the skin every 8 (eight) weeks. 1 mL 5  . budesonide (PULMICORT) 1 MG/2ML nebulizer solution Take 2 mLs by nebulization 4 (four) times daily as needed for wheezing.    . budesonide-formoterol (SYMBICORT) 160-4.5 MCG/ACT inhaler INHALE 2 PUFFS TWICE DAILY. RINSE MOUTH AFTER EACH USE 30.6 Inhaler 5  . EPINEPHrine 0.3 mg/0.3 mL IJ SOAJ injection Inject 0.3 mLs (0.3 mg total) into the muscle as needed for anaphylaxis. 2 each 5  . ferrous sulfate 325 (65 FE) MG EC tablet Take 1 tablet (325 mg total) by mouth in the morning and at bedtime. 90 tablet 1  . fluticasone (FLONASE SENSIMIST) 27.5 MCG/SPRAY nasal spray Place 2 sprays into the nose daily. 10 g 12  . guaiFENesin (MUCINEX) 600 MG 12 hr tablet Take 600 mg by mouth 2 (two) times daily as needed for cough.    Marland Kitchen ipratropium-albuterol (DUONEB) 0.5-2.5 (3) MG/3ML SOLN INHALE 1 VIAL BY MOUTH VIA NEBULIZER EVERY 6 HOURS AS NEEDED 360 mL 12  . montelukast (SINGULAIR) 10 MG tablet TAKE 1 TABLET BY MOUTH EVERYDAY AT BEDTIME (Patient taking differently: Take 10 mg by mouth at bedtime. ) 90 tablet 1  . nicotine (NICODERM CQ - DOSED IN MG/24 HOURS) 21 mg/24hr patch Place 1 patch (21 mg total) onto the skin daily. 7 patch 0  . predniSONE (DELTASONE) 20 MG tablet Take 20 mg by mouth taper from 4 doses each day to 1 dose and stop.    . predniSONE (DELTASONE) 20 MG tablet Take 2 tablets by mouth daily for 3 days 6 tablet 0  . SAXENDA 18 MG/3ML SOPN Inject 2.5 mg into the skin daily.    Marland Kitchen SPIRIVA HANDIHALER 18 MCG inhalation capsule INHALE 1 CAPSULE VIA HANDIHALER ONCE DAILY AT THE SAME TIME EVERY DAY (Patient taking differently: Place 18 mcg into inhaler and inhale daily. Same time everyday) 30 capsule 5  . varenicline (CHANTIX CONTINUING MONTH PAK) 1 MG tablet Take 1 tablet (1 mg total) by mouth 2 (two) times daily. 60 tablet 3   No facility-administered medications prior to visit.     Review of Systems:   Constitutional:   No   weight loss, night sweats,  Fevers, chills,  +fatigue, or  lassitude.  HEENT:   No headaches,  Difficulty swallowing,  Tooth/dental problems, or  Sore throat,                No sneezing, itching, ear ache, nasal congestion, post nasal drip,   CV:  No chest pain,  Orthopnea, PND, swelling in lower extremities, anasarca, dizziness, palpitations, syncope.   GI  No heartburn, indigestion, abdominal pain, nausea, vomiting, diarrhea, change in bowel habits, loss of appetite, bloody stools.   Resp:    No chest wall deformity  Skin: no rash or lesions.  GU: no dysuria, change in color of urine, no urgency or frequency.  No flank pain, no hematuria   MS:  No joint pain or swelling.  No decreased range of motion.  No back pain.    Physical Exam  BP 118/74 (BP Location: Left Arm, Cuff Size: Normal)   Pulse 94   Temp 97.9 F (36.6 C) (Oral)   Ht 5' (1.524 m)   Wt (!) 360 lb 9.6 oz (163.6 kg)   SpO2 98%   BMI 70.42 kg/m   GEN: A/Ox3; pleasant , NAD, morbidly obese   HEENT:  [place]/AT,   NOSE-clear, THROAT-clear, no lesions, no postnasal drip or exudate noted.   NECK:  Supple w/ fair ROM; no JVD; normal carotid impulses w/o bruits; no thyromegaly or nodules palpated; no lymphadenopathy.    RESP  Clear  P & A; w/o, wheezes/ rales/ or rhonchi. no accessory muscle use, no dullness to percussion  CARD:  RRR, no m/r/g, 1`+peripheral edema, pulses intact, no cyanosis or clubbing.  GI:   Soft & nt; nml bowel sounds; no organomegaly or masses detected.   Musco: Warm bil, no deformities or joint swelling noted.   Neuro: alert, no focal deficits noted.    Skin: Warm, no lesions or rashes    Lab Results:  CBC    Component Value Date/Time   WBC 14.3 (H) 04/14/2020 1112   WBC 11.3 (H) 04/06/2020 0236   RBC 5.10 04/14/2020 1112   RBC 4.28 04/06/2020 0236   HGB 10.4 (L) 04/14/2020 1112   HCT 38.2 04/14/2020 1112   PLT 358 04/14/2020 1112   MCV 75 (L) 04/14/2020 1112   MCH 20.4 (L)  04/14/2020 1112   MCH 18.5 (L) 04/06/2020 0236   MCHC 27.2 (L) 04/14/2020 1112   MCHC 25.4 (L) 04/06/2020 0236   RDW 24.7 (H) 04/14/2020 1112   LYMPHSABS 1.7 04/14/2020 1112   MONOABS 0.8 04/06/2020 0236   EOSABS 0.2 04/14/2020 1112   BASOSABS 0.1 04/14/2020 1112    BMET    Component Value Date/Time   NA 144 04/14/2020 1112   K 3.7 04/14/2020 1112   CL 106 04/14/2020 1112   CO2 22 04/14/2020 1112   GLUCOSE 110 (H) 04/14/2020 1112   GLUCOSE 107 (H) 04/06/2020 0236   BUN 11 04/14/2020 1112   CREATININE 0.78 04/14/2020 1112   CREATININE 0.67 07/11/2019 1011   CALCIUM 9.1 04/14/2020 1112   GFRNONAA 94 04/14/2020 1112   GFRNONAA 108 07/11/2019 1011   GFRAA 108 04/14/2020 1112   GFRAA 126 07/11/2019 1011    BNP No results found for: BNP  ProBNP    Component Value Date/Time   PROBNP 41.0 04/04/2019 0955    Imaging: CT Angio Chest PE W and/or Wo Contrast  Result Date: 04/04/2020 CLINICAL DATA:  Cardiac and respiratory arrest, history of asthma EXAM: CT ANGIOGRAPHY CHEST WITH CONTRAST TECHNIQUE: Multidetector CT imaging of the chest was performed using the standard protocol during bolus administration of intravenous contrast. Multiplanar CT image reconstructions and MIPs were obtained to evaluate the vascular anatomy. CONTRAST:  14mL OMNIPAQUE IOHEXOL 350 MG/ML SOLN COMPARISON:  04/04/2020 FINDINGS: Cardiovascular: This is a technically adequate evaluation of the pulmonary vasculature. No filling defects or pulmonary emboli. The heart is unremarkable without pericardial effusion. The thoracic aorta is normal in caliber with no aneurysm or dissection. Mediastinum/Nodes: No enlarged mediastinal, hilar, or axillary lymph nodes. Thyroid gland, trachea, and esophagus demonstrate no significant findings. Lungs/Pleura: Minimal hypoventilatory changes within the dependent lower lobes. No airspace disease, effusion, or pneumothorax. Central airways are patent. Upper Abdomen: No acute  abnormality. Musculoskeletal: No acute or destructive bony lesions. Reconstructed images demonstrate no additional findings. Review of the MIP images  confirms the above findings. IMPRESSION: 1. No evidence of pulmonary embolus. 2. No acute intrathoracic process. Electronically Signed   By: Randa Ngo M.D.   On: 04/04/2020 17:14   DG Chest Port 1 View  Result Date: 04/04/2020 CLINICAL DATA:  Dizziness. EXAM: PORTABLE CHEST 1 VIEW COMPARISON:  10/19/2019. FINDINGS: Mediastinum and hilar structures normal. Cardiomegaly. No pulmonary venous congestion. Low lung volumes. Infiltrate in the right lung base cannot be excluded. No pleural effusion or pneumothorax. IMPRESSION: 1.  Cardiomegaly.  No pulmonary venous congestion. 2. Low lung volumes. Infiltrate in the right lung base cannot be excluded. Electronically Signed   By: Marcello Moores  Register   On: 04/04/2020 12:49      PFT Results Latest Ref Rng & Units 05/01/2014  FVC-Pre L 2.34  FVC-Predicted Pre % 80  FVC-Post L 2.47  FVC-Predicted Post % 84  Pre FEV1/FVC % % 58  Post FEV1/FCV % % 59  FEV1-Pre L 1.35  FEV1-Predicted Pre % 55  FEV1-Post L 1.46  DLCO uncorrected ml/min/mmHg 20.38  DLCO UNC% % 93  DLVA Predicted % 125  TLC L 4.73  TLC % Predicted % 98  RV % Predicted % 161    No results found for: NITRICOXIDE      Assessment & Plan:   No problem-specific Assessment & Plan notes found for this encounter.     Rexene Edison, NP 04/29/2020

## 2020-05-01 NOTE — Assessment & Plan Note (Signed)
Prone to recurrent exacerbations .  Recent presumed exacerbation with cardiopulmonary arrest requiring CPR.  Patient is on maximum therapy including Symbicort Spiriva and newly addition of Fasenra to be started soon.  Patient is continue follow-up with allergy.  Asthma action plan discussed with patient in detail.  Plan  Patient Instructions  Continue on Symbicort  2 puffs Twice daily  , rinse after use.  Continue on Spiriva 1 puff daily , rinse after use.  Continue on Albuterol inhaler or Neb As needed   Continue on Singulair and Flonase daily  Add Zyrtec 10mg  At bedtime   Refer to cardiology for syncope/cardiac arrest.  Continue to work on quitting smoking  Continue on Chantix as prescribed.  Activity as tolerated.  Check on Fasenra update.  Follow up with Allergist as planned  Follow up with Dr. Annamaria Boots  In 4-6 weeks and As needed   Please contact office for sooner follow up if symptoms do not improve or worsen or seek emergency care

## 2020-05-01 NOTE — Assessment & Plan Note (Signed)
Patient with cardiopulmonary arrest possibly secondary to asthma exacerbation Patient did require CPR x5 minutes. Troponins were negative.  CT chest was negative for PE.  No echo was done. Considering patient's age and requirement for CPR patient needs referral to cardiology to determine if further testing is indicated

## 2020-05-01 NOTE — Assessment & Plan Note (Signed)
Continue follow-up with allergist

## 2020-05-06 NOTE — Telephone Encounter (Signed)
Pt has been scheduled for 05/27/2020.

## 2020-05-06 NOTE — Progress Notes (Signed)
Not seen

## 2020-05-08 ENCOUNTER — Encounter: Payer: Self-pay | Admitting: General Practice

## 2020-05-09 ENCOUNTER — Other Ambulatory Visit: Payer: Self-pay | Admitting: Internal Medicine

## 2020-05-09 DIAGNOSIS — J449 Chronic obstructive pulmonary disease, unspecified: Secondary | ICD-10-CM

## 2020-05-09 DIAGNOSIS — J4541 Moderate persistent asthma with (acute) exacerbation: Secondary | ICD-10-CM

## 2020-05-16 ENCOUNTER — Other Ambulatory Visit: Payer: Self-pay | Admitting: Nurse Practitioner

## 2020-05-20 ENCOUNTER — Ambulatory Visit: Payer: BC Managed Care – PPO | Admitting: Allergy

## 2020-05-27 ENCOUNTER — Ambulatory Visit (HOSPITAL_BASED_OUTPATIENT_CLINIC_OR_DEPARTMENT_OTHER): Payer: BC Managed Care – PPO | Attending: Internal Medicine | Admitting: Internal Medicine

## 2020-06-17 ENCOUNTER — Ambulatory Visit: Payer: BC Managed Care – PPO | Admitting: Allergy

## 2020-06-23 ENCOUNTER — Other Ambulatory Visit: Payer: BC Managed Care – PPO

## 2020-06-23 ENCOUNTER — Ambulatory Visit: Payer: BC Managed Care – PPO | Admitting: Internal Medicine

## 2020-06-23 DIAGNOSIS — Z20822 Contact with and (suspected) exposure to covid-19: Secondary | ICD-10-CM

## 2020-06-23 NOTE — Progress Notes (Deleted)
HPI F smoker followed for Asthma, COPD, Chronic Rhinitis, Urticaria, Morbid Obesity, Iron Def Anemia Office spirometry 2017 by [place] office- mild obstruction PFT 05/01/14- Severe obstruction. 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 IgE 234,  EOS low on repeated steroid bursts (04/04/19) -----------------------------------------------------------------------------   02/06/20-  Virtual Visit via Telephone Note  History of Present Illness:  34  F smoker followed for Asthma/ Xolair, COPD, Chronic Rhinitis, Urticaria,  OSA,  Morbid Obesity, Iron Def Anemia Albuterol hfa, Symbicort 160, neb Duocneb, Nicoderm CQ/ Chantix, Xolair, Spiriva,  Doing" a lot better". Credits Symbicort, working well for her. No receent flares, ED visits or prednisone. Xolair was ineffective. She has been approved to switch to [place] and staff will be in touch with her about getting that started. She understands her insurance may want her to give self-injection, which should be ok.  Complex sleep apnea byhST. She was noncompliant with CPAP and DME took it away. She is willing to try again with reorder.   Observations/Objective:   Assessment and Plan: Asthma-COPD Overlap- much better control. Ok to start Berna Bue when available Tobacco use- Has used Nicoderm and Chantix. Pres to keep trying.   Follow Up Instructions: 3 months- Fasenra assessment  06/23/20-  44  F smoker followed for Asthma/ Xolair, COPD, Chronic Rhinitis, Urticaria,  OSA,  Morbid Obesity, Iron Def Anemia Albuterol hfa, Symbicort 160, neb Duoneb, Nicoderm CQ/ Chantix, Fasenra, Spiriva, Zzyrtec,   --Seen here by NP 8/3 post-hosp in July. Had witnessed respiratory arrest, CPR, asthma exacerbation. Treated hosp w steroids. Severely anemic, counseled smoking cessation.  She had  needed to update sleep study to qualify for another CPAP trial after non-compliance, but has not had the sleep study. Also follows with Allergist Covid vax- Flu vax- Body weight today-   CTa chest-  IMPRESSION: 1. No evidence of pulmonary embolus. 2. No acute intrathoracic process.  ROS-see HPI   + = positive Constitutional:    weight loss, night sweats, fevers, chills, fatigue, lassitude. HEENT:    headaches, difficulty swallowing, tooth/dental problems, sore throat,       sneezing, itching, ear ache, nasal congestion, post nasal drip, snoring CV:    chest pain, orthopnea, PND, swelling in lower extremities, anasarca,                                  dizziness, palpitations Resp:  + shortness of breath with exertion or at rest.                productive cough,   +non-productive cough, coughing up of blood.              change in color of mucus.  +wheezing.   Skin:    rash or lesions. GI:  No-   heartburn, indigestion, abdominal pain, nausea, vomiting, diarrhea,                 change in bowel habits, loss of appetite GU: dysuria, change in color of urine, no urgency or frequency.   flank pain. MS:   joint pain, stiffness, decreased range of motion, back pain. Neuro-     nothing unusual Psych:  change in mood or affect.  depression or anxiety.   memory loss.  OBJ- Physical Exam General- Alert, Oriented,  Affect-appropriate, Distress- none acute, +morbidly obese Skin- rash-none, lesions- none, excoriation- none Lymphadenopathy- none Head- atraumatic            Eyes- Gross vision intact, PERRLA, conjunctivae and secretions clear            Ears- Hearing, canals-normal            Nose- Clear, no-Septal dev, mucus, polyps, erosion, perforation             Throat- Mallampati III, mucosa clear , drainage- none, tonsils- atrophic Neck- flexible , trachea midline, no stridor , thyroid nl, carotid no bruit Chest - symmetrical excursion , unlabored           Heart/CV- RRR , no murmur , no  gallop  , no rub, nl s1 s2                           - JVD- none , edema- none, stasis changes- none, varices- none           Lung- Clear/ unlabored,  wheeze- none, cough- none , dullness-none, rub- none           Chest wall-  Abd-  Br/ Gen/ Rectal- Not done, not indicated Extrem- cyanosis- none, clubbing, none, atrophy- none, strength- nl Neuro- grossly intact to observation

## 2020-06-26 LAB — NOVEL CORONAVIRUS, NAA: SARS-CoV-2, NAA: NOT DETECTED

## 2020-08-30 ENCOUNTER — Other Ambulatory Visit: Payer: Self-pay | Admitting: Nurse Practitioner

## 2020-09-09 ENCOUNTER — Other Ambulatory Visit: Payer: Self-pay | Admitting: Nurse Practitioner

## 2020-09-25 ENCOUNTER — Other Ambulatory Visit: Payer: Self-pay | Admitting: Nurse Practitioner

## 2020-12-13 ENCOUNTER — Emergency Department (HOSPITAL_COMMUNITY)
Admission: EM | Admit: 2020-12-13 | Discharge: 2020-12-13 | Disposition: A | Discharge status: Home / Self Care | Payer: BC Managed Care – PPO | Attending: Emergency Medicine | Admitting: Emergency Medicine

## 2020-12-13 ENCOUNTER — Emergency Department (HOSPITAL_COMMUNITY): Payer: BC Managed Care – PPO

## 2020-12-13 ENCOUNTER — Encounter (HOSPITAL_COMMUNITY): Payer: Self-pay

## 2020-12-13 ENCOUNTER — Other Ambulatory Visit: Payer: Self-pay

## 2020-12-13 DIAGNOSIS — J449 Chronic obstructive pulmonary disease, unspecified: Secondary | ICD-10-CM | POA: Insufficient documentation

## 2020-12-13 DIAGNOSIS — R202 Paresthesia of skin: Secondary | ICD-10-CM | POA: Diagnosis not present

## 2020-12-13 DIAGNOSIS — Z7951 Long term (current) use of inhaled steroids: Secondary | ICD-10-CM | POA: Insufficient documentation

## 2020-12-13 DIAGNOSIS — F17210 Nicotine dependence, cigarettes, uncomplicated: Secondary | ICD-10-CM | POA: Diagnosis not present

## 2020-12-13 DIAGNOSIS — J45901 Unspecified asthma with (acute) exacerbation: Secondary | ICD-10-CM | POA: Insufficient documentation

## 2020-12-13 DIAGNOSIS — M25561 Pain in right knee: Secondary | ICD-10-CM | POA: Insufficient documentation

## 2020-12-13 DIAGNOSIS — R200 Anesthesia of skin: Secondary | ICD-10-CM | POA: Diagnosis not present

## 2020-12-13 LAB — BASIC METABOLIC PANEL
Anion gap: 8 (ref 5–15)
BUN: 13 mg/dL (ref 6–20)
CO2: 26 mmol/L (ref 22–32)
Calcium: 8.8 mg/dL — ABNORMAL LOW (ref 8.9–10.3)
Chloride: 103 mmol/L (ref 98–111)
Creatinine, Ser: 0.75 mg/dL (ref 0.44–1.00)
GFR, Estimated: >60 mL/min (ref >60)
Glucose, Bld: 96 mg/dL (ref 70–99)
Potassium: 3.8 mmol/L (ref 3.5–5.1)
Sodium: 137 mmol/L (ref 135–145)

## 2020-12-13 LAB — CBC
HCT: 48.6 % — ABNORMAL HIGH (ref 36.0–46.0)
Hemoglobin: 14.9 g/dL (ref 12.0–15.0)
MCH: 28.2 pg (ref 26.0–34.0)
MCHC: 30.7 g/dL (ref 30.0–36.0)
MCV: 91.9 fL (ref 80.0–100.0)
Platelets: 198 10*3/uL (ref 150–400)
RBC: 5.29 MIL/uL — ABNORMAL HIGH (ref 3.87–5.11)
RDW: 14.6 % (ref 11.5–15.5)
WBC: 11.6 10*3/uL — ABNORMAL HIGH (ref 4.0–10.5)
nRBC: 0 % (ref 0.0–0.2)

## 2020-12-13 NOTE — ED Provider Notes (Signed)
[facility] DEPT Provider Note   CSN: [id number] Arrival date & time: 12/13/20  1617     History Chief Complaint  Patient presents with  . hand numbness  . Knee Pain    April Ellis is a 45 y.o. female.  HPI   Patient presents to the ED for evaluation of numbness in her right hand as well as a sensation that her right leg is heavier than usual.  She states these symptoms initially started about 1 week ago when she noticed the numbness in her right hand few days ago.  She is not having any weakness in her hands.  She is not having any difficulty walking she feels like it is just more noticeable.  She has not had any trouble with her speech or vision.  She is not having any difficulty holding onto objects.  Patient denies any pain.  She has not had any fevers or chills.  No swelling noticed.  Past Medical History:  Diagnosis Date  . Anemia   . Asthma   . Bronchitis   . COPD (chronic obstructive pulmonary disease) ([place])   . Dyspnea    with asthma flair  . Obesity     Patient Active Problem List   Diagnosis Date Noted  . Acute respiratory failure with hypoxia ([place]) 04/04/2020  . Respiratory arrest ([place]) 04/04/2020  . Cardiac arrest ([place]) 04/04/2020  . Thrombocytosis 04/04/2020  . Obstructive sleep apnea 06/13/2019  . Nocturnal hypoxemia 06/13/2019  . Daytime somnolence 04/04/2019  . Dyspnea 02/15/2019  . Allergic reactions 08/16/2016  . Chronic rhinitis 08/16/2016  . Asthma, chronic, unspecified asthma severity, with acute exacerbation 09/30/2014  . Leukocytosis 09/30/2014  . Urticaria   . Iron deficiency anemia 08/14/2014  . History of tobacco abuse 08/14/2014  . Atypical chest pain 06/27/2014  . Tobacco abuse 05/12/2014  . Asthma-COPD overlap syndrome ([place]) 05/01/2014  . Class 3 severe obesity due to excess calories without serious comorbidity with body mass index (BMI) of 60.0 to 69.9 in adult ([place]) 12/19/2008  . ELEVATED BLOOD  PRESSURE WITHOUT DIAGNOSIS OF HYPERTENSION 12/19/2008    Past Surgical History:  Procedure Laterality Date  . DENTAL SURGERY     2 teeth pulled, 11/22/14  . DILITATION & CURRETTAGE/HYSTROSCOPY WITH NOVASURE ABLATION N/A 03/05/2020   Procedure: DILATATION & CURETTAGE/HYSTEROSCOPY WITH NOVASURE ABLATION;  Surgeon: Molli Posey, MD;  Location: [facility];  Service: Gynecology;  Laterality: N/A;  . NO PAST SURGERIES       OB History   No obstetric history on file.     Family History  Problem Relation Age of Onset  . Diabetes Mother   . Cancer Mother   . Diabetes Sister   . Hypertension Sister   . Hypertension Sister   . Urticaria Neg Hx   . Immunodeficiency Neg Hx   . Eczema Neg Hx   . Asthma Neg Hx   . Angioedema Neg Hx   . Allergic rhinitis Neg Hx     Social History   Tobacco Use  . Smoking status: Light Tobacco Smoker    Packs/day: 0.25    Years: 15.00    Pack years: 3.75    Types: Cigarettes    Last attempt to quit: 09/16/2014    Years since quitting: 6.2  . Smokeless tobacco: Never Used  Vaping Use  . Vaping Use: Never used  Substance Use Topics  . Alcohol use: Yes    Alcohol/week: 0.0 standard drinks    Comment: occas.  Marland Kitchen  Drug use: Yes    Types: Marijuana    Comment: occas.    Home Medications Prior to Admission medications   Medication Sig Start Date End Date Taking? Authorizing Provider  albuterol (PROVENTIL) (2.5 MG/3ML) 0.083% nebulizer solution Take 3 mLs by nebulization 4 (four) times daily. 02/27/20   [provider]  albuterol (VENTOLIN HFA) 108 (90 Base) MCG/ACT inhaler Inhale 2 puffs into the lungs every 4 (four) hours as needed for wheezing or shortness of breath. 04/29/20   Parrett, Fonnie Mu, NP  Benralizumab (FASENRA) 30 MG/ML SOSY Inject 1 mL (30 mg total) into the skin every 8 (eight) weeks. 02/11/20   Young, Tarri Fuller D, MD  budesonide (PULMICORT) 1 MG/2ML nebulizer solution Take 2 mLs by nebulization 4 (four) times daily as needed for  wheezing. 04/02/20   [provider]  budesonide-formoterol (SYMBICORT) 160-4.5 MCG/ACT inhaler INHALE 2 PUFFS TWICE DAILY. RINSE MOUTH AFTER EACH USE 04/07/20   Baird Lyons D, MD  EPINEPHrine 0.3 mg/0.3 mL IJ SOAJ injection Inject 0.3 mLs (0.3 mg total) into the muscle as needed for anaphylaxis. 07/24/19   Baird Lyons D, MD  ferrous sulfate 325 (65 FE) MG EC tablet Take 1 tablet (325 mg total) by mouth in the morning and at bedtime. 04/06/20   Darliss Cheney, MD  fluticasone (FLONASE SENSIMIST) 27.5 MCG/SPRAY nasal spray Place 2 sprays into the nose daily. 02/20/20   [name], FNP  guaiFENesin (MUCINEX) 600 MG 12 hr tablet Take 600 mg by mouth 2 (two) times daily as needed for cough.    [provider]  ipratropium-albuterol (DUONEB) 0.5-2.5 (3) MG/3ML SOLN INHALE 1 VIAL BY MOUTH VIA NEBULIZER EVERY 6 HOURS AS NEEDED 05/09/20   Young, Tarri Fuller D, MD  montelukast (SINGULAIR) 10 MG tablet TAKE 1 TABLET BY MOUTH EVERYDAY AT BEDTIME Patient taking differently: Take 10 mg by mouth at bedtime.  09/07/19   Ngetich, Dinah C, NP  nicotine (NICODERM CQ - DOSED IN MG/24 HOURS) 21 mg/24hr patch Place 1 patch (21 mg total) onto the skin daily. 01/16/20   Yopp, Amber C, RPH-CPP  predniSONE (DELTASONE) 20 MG tablet Take 20 mg by mouth taper from 4 doses each day to 1 dose and stop. 04/01/20   [provider]  predniSONE (DELTASONE) 20 MG tablet Take 2 tablets by mouth daily for 3 days 04/23/20   [name], FNP  SAXENDA 18 MG/3ML SOPN INJECT 0.5MLS (3 MG) INTO THE SKIN DAILY. 05/16/20   [name], FNP  SPIRIVA HANDIHALER 18 MCG inhalation capsule INHALE 1 CAPSULE VIA HANDIHALER ONCE DAILY AT [place] Patient taking differently: Place 18 mcg into inhaler and inhale daily. Same time everyday 03/11/20   Deneise Lever, MD  varenicline (CHANTIX CONTINUING MONTH PAK) 1 MG tablet Take 1 tablet (1 mg total) by mouth 2 (two) times daily. 01/16/20   Yopp, Amber C, RPH-CPP     Allergies    Penicillins  Review of Systems   Review of Systems  All other systems reviewed and are negative.   Physical Exam Updated Vital Signs BP (!) 148/90 (BP Location: Left Arm)   Pulse (!) 104   Temp 98.5 F (36.9 C) (Oral)   Resp 18   Ht 1.549 m (5\' 1" )   Wt (!) 159.7 kg   SpO2 96%   BMI 66.51 kg/m   Physical Exam Vitals and nursing note reviewed.  Constitutional:      General: She is not in acute distress.  Appearance: She is well-developed.     Comments: Morbidly obese  HENT:     Head: Normocephalic and atraumatic.     Right Ear: External ear normal.     Left Ear: External ear normal.  Eyes:     General: No scleral icterus.       Right eye: No discharge.        Left eye: No discharge.     Conjunctiva/sclera: Conjunctivae normal.  Neck:     Trachea: No tracheal deviation.  Cardiovascular:     Rate and Rhythm: Normal rate.  Pulmonary:     Effort: Pulmonary effort is normal. No respiratory distress.     Breath sounds: No stridor.  Abdominal:     General: Bowel sounds are normal. There is no distension.     Palpations: Abdomen is soft.     Tenderness: There is no abdominal tenderness. There is no guarding or rebound.  Musculoskeletal:        General: No tenderness.     Cervical back: Neck supple.     Comments: No edema or erythema, normal pulses, no cyanosis  Skin:    General: Skin is warm and dry.     Findings: No rash.  Neurological:     Mental Status: She is alert and oriented to person, place, and time.     Cranial Nerves: No cranial nerve deficit (no facial droop, extraocular movements intact, no slurred speech).     Sensory: No sensory deficit.     Motor: No abnormal muscle tone or seizure activity.     Coordination: Coordination normal.     Comments: No pronator drift bilateral upper extrem, able to hold both legs off bed for 5 seconds, sensation intact in all extremities, no visual field cuts, no left or right sided neglect, normal  finger-nose exam bilaterally, no nystagmus noted      ED Results / Procedures / Treatments   Labs (all labs ordered are listed, but only abnormal results are displayed) Labs Reviewed  CBC - Abnormal; Notable for the following components:      Result Value   WBC 11.6 (*)    RBC 5.29 (*)    HCT 48.6 (*)    All other components within normal limits  BASIC METABOLIC PANEL - Abnormal; Notable for the following components:   Calcium 8.8 (*)    All other components within normal limits    EKG None  Radiology CT Head Wo Contrast  Result Date: 12/13/2020 CLINICAL DATA:  Right hand numbness for 3 days. EXAM: CT HEAD WITHOUT CONTRAST TECHNIQUE: Contiguous axial images were obtained from the base of the skull through the vertex without intravenous contrast. COMPARISON:  None. FINDINGS: Brain: No evidence of acute infarction, hemorrhage, hydrocephalus, extra-axial collection or mass lesion/mass effect. Vascular: No hyperdense vessel or unexpected calcification. Skull: Normal. Negative for fracture or focal lesion. Sinuses/Orbits: Negative. Other: None. IMPRESSION: Normal head CT. Electronically Signed   By: Inge Rise M.D.   On: 12/13/2020 17:35    Procedures Procedures   Medications Ordered in ED Medications - No data to display  ED Course  I have reviewed the triage vital signs and the nursing notes.  Pertinent labs & imaging results that were available during my care of the patient were reviewed by me and considered in my medical decision making (see chart for details).  Clinical Course as of 12/13/20 1915  Sat Dec 13, 2020  1849 Labs reviewed.  Metabolic panel normal.  CBC unremarkable. [JS]  1849 Head CT without acute findings. [JK]    Clinical Course User Index [JK] Dorie Rank, MD   MDM Rules/Calculators/A&P                          Patient presented to the ED for evaluation of tingling primarily in her hand.  Patient however also felt like she had some symptoms in  her right leg however she is not really having any weakness and does not have any change in her sensation.  The tingling sensation is primarily in the hand.  Suspect this is more of a peripheral neuropathy.  Patient does have a normal head CT.  Laboratory tests are unremarkable.  Recommended outpatient follow-up with neurology. Final Clinical Impression(s) / ED Diagnoses Final diagnoses:  Paresthesia    Rx / DC Orders ED Discharge Orders    None       Dorie Rank, MD 12/13/20 1916

## 2020-12-13 NOTE — Discharge Instructions (Signed)
The blood tests and the CAT scan today were normal.  Consider following up with a neurologist if the symptoms persist.  Return to the ED as needed for worsening symptoms.

## 2020-12-13 NOTE — ED Triage Notes (Signed)
Patient c/o right knee pain x one week and right hand numbness x 3 days.

## 2020-12-13 NOTE — ED Notes (Signed)
Light green tube sent to lab at this time.

## 2020-12-22 ENCOUNTER — Ambulatory Visit (INDEPENDENT_AMBULATORY_CARE_PROVIDER_SITE_OTHER): Payer: BC Managed Care – PPO | Admitting: Nurse Practitioner

## 2020-12-22 ENCOUNTER — Encounter: Payer: Self-pay | Admitting: Nurse Practitioner

## 2020-12-22 ENCOUNTER — Other Ambulatory Visit: Payer: Self-pay

## 2020-12-22 VITALS — BP 122/68 | HR 98 | Temp 98.2°F | Ht 61.4 in | Wt 359.0 lb

## 2020-12-22 DIAGNOSIS — F3289 Other specified depressive episodes: Secondary | ICD-10-CM | POA: Diagnosis not present

## 2020-12-22 DIAGNOSIS — R202 Paresthesia of skin: Secondary | ICD-10-CM

## 2020-12-22 DIAGNOSIS — Z720 Tobacco use: Secondary | ICD-10-CM

## 2020-12-22 DIAGNOSIS — R200 Anesthesia of skin: Secondary | ICD-10-CM | POA: Diagnosis not present

## 2020-12-22 DIAGNOSIS — Z6841 Body Mass Index (BMI) 40.0 and over, adult: Secondary | ICD-10-CM

## 2020-12-22 MED ORDER — BUPROPION HCL ER (XL) 150 MG PO TB24: 150.0000 mg | ORAL_TABLET | ORAL | 2 refills | Status: DC

## 2020-12-22 NOTE — Progress Notes (Signed)
I,Yamilka Roman [employer] as a Education administrator for [employer], FNP.,have documented all relevant documentation on the behalf of [name], FNP,as directed by  [name], FNP while in the presence of [name], [place]. This visit occurred during the SARS-CoV-2 public health emergency.  Safety protocols were in place, including screening questions prior to the visit, additional usage of staff PPE, and extensive cleaning of exam room while observing appropriate contact time as indicated for disinfecting solutions.  Subjective:     Patient ID: April Ellis , female    DOB: 1975/09/30 , 45 y.o.   MRN: [id number]   Chief Complaint  Patient presents with  . ER f/u    Patient presents today for a ER f/u she went for right hand numbness that has been going on for a week    HPI  Patient presents today for a ER f/u for right hand numbness. She stated it has been going on for a week. Reports having itching to right arm, she is also having tingling to her hand as well. This same thing happened to her right leg which has improved.  She is able to grip items.  Feels tight.  She is right handed. When she touches her hand feels like it is sleep.    Wt Readings from Last 3 Encounters: 12/22/20 : (!) 359 lb (162.8 kg) 12/13/20 : (!) 352 lb (159.7 kg) 04/29/20 : (!) 360 lb 9.6 oz (163.6 kg)     Past Medical History:  Diagnosis Date  . Anemia   . Asthma   . Bronchitis   . COPD (chronic obstructive pulmonary disease) ([place])   . Dyspnea    with asthma flair  . Obesity      Family History  Problem Relation Age of Onset  . Diabetes Mother   . Cancer Mother   . Diabetes Sister   . Hypertension Sister   . Hypertension Sister   . Urticaria Neg Hx   . Immunodeficiency Neg Hx   . Eczema Neg Hx   . Asthma Neg Hx   . Angioedema Neg Hx   . Allergic rhinitis Neg Hx      Current Outpatient Medications:  .  albuterol (PROVENTIL) (2.5 MG/3ML) 0.083% nebulizer solution, Take 3 mLs by nebulization  4 (four) times daily., Disp: , Rfl:  .  albuterol (VENTOLIN HFA) 108 (90 Base) MCG/ACT inhaler, Inhale 2 puffs into the lungs every 4 (four) hours as needed for wheezing or shortness of breath., Disp: 8 g, Rfl: 5 .  Benralizumab (FASENRA) 30 MG/ML SOSY, Inject 1 mL (30 mg total) into the skin every 8 (eight) weeks., Disp: 1 mL, Rfl: 5 .  budesonide (PULMICORT) 1 MG/2ML nebulizer solution, Take 2 mLs by nebulization 4 (four) times daily as needed for wheezing., Disp: , Rfl:  .  budesonide-formoterol (SYMBICORT) 160-4.5 MCG/ACT inhaler, INHALE 2 PUFFS TWICE DAILY. RINSE MOUTH AFTER EACH USE, Disp: 30.6 Inhaler, Rfl: 5 .  buPROPion (WELLBUTRIN XL) 150 MG 24 hr tablet, Take 1 tablet (150 mg total) by mouth every morning., Disp: 30 tablet, Rfl: 2 .  EPINEPHrine 0.3 mg/0.3 mL IJ SOAJ injection, Inject 0.3 mLs (0.3 mg total) into the muscle as needed for anaphylaxis., Disp: 2 each, Rfl: 5 .  ferrous sulfate 325 (65 FE) MG EC tablet, Take 1 tablet (325 mg total) by mouth in the morning and at bedtime., Disp: 90 tablet, Rfl: 1 .  fluticasone (FLONASE SENSIMIST) 27.5 MCG/SPRAY nasal spray, Place 2 sprays into the nose daily.,  Disp: 10 g, Rfl: 12 .  guaiFENesin (MUCINEX) 600 MG 12 hr tablet, Take 600 mg by mouth 2 (two) times daily as needed for cough., Disp: , Rfl:  .  ipratropium-albuterol (DUONEB) 0.5-2.5 (3) MG/3ML SOLN, INHALE 1 VIAL BY MOUTH VIA NEBULIZER EVERY 6 HOURS AS NEEDED, Disp: 1080 mL, Rfl: 5 .  montelukast (SINGULAIR) 10 MG tablet, TAKE 1 TABLET BY MOUTH EVERYDAY AT BEDTIME (Patient taking differently: Take 10 mg by mouth at bedtime.), Disp: 90 tablet, Rfl: 1 .  nicotine (NICODERM CQ - DOSED IN MG/24 HOURS) 21 mg/24hr patch, Place 1 patch (21 mg total) onto the skin daily., Disp: 7 patch, Rfl: 0 .  predniSONE (DELTASONE) 20 MG tablet, Take 20 mg by mouth taper from 4 doses each day to 1 dose and stop., Disp: , Rfl:  .  SAXENDA 18 MG/3ML SOPN, INJECT 0.5MLS (3 MG) INTO THE SKIN DAILY., Disp: 9 mL,  Rfl: 1 .  SPIRIVA HANDIHALER 18 MCG inhalation capsule, INHALE 1 CAPSULE VIA HANDIHALER ONCE DAILY AT THE SAME TIME EVERY DAY (Patient taking differently: Place 18 mcg into inhaler and inhale daily. Same time everyday), Disp: 30 capsule, Rfl: 5   Allergies  Allergen Reactions  . Penicillins     Childhood Allergy Did it involve swelling of the face/tongue/throat, SOB, or low BP? UNK Did it involve sudden or severe rash/hives, skin peeling, or any reaction on the inside of your mouth or nose? UNK Did you need to seek medical attention at a hospital or doctor's office? UNK When did it last happen?more than 10 years If all above answers are "NO", may proceed with cephalosporin use.      Review of Systems  Constitutional: Negative.   Respiratory: Negative.   Cardiovascular: Negative.  Negative for chest pain, palpitations and leg swelling.  Neurological: Positive for numbness (right hand ). Negative for dizziness and headaches.  Psychiatric/Behavioral:       Depressed mood at times     Today's Vitals   12/22/20 1205  BP: 122/68  Pulse: 98  Temp: 98.2 F (36.8 C)  Weight: (!) 359 lb (162.8 kg)  Height: 5' 1.4" (1.56 m)  PainSc: 0-No pain   Body mass index is 66.95 kg/m.   Objective:  Physical Exam Vitals reviewed.  Constitutional:      General: She is not in acute distress.    Appearance: Normal appearance. She is obese.  Cardiovascular:     Pulses: Normal pulses.     Heart sounds: Normal heart sounds. No murmur heard.   Pulmonary:     Effort: Pulmonary effort is normal. No respiratory distress.  Musculoskeletal:        General: No swelling, tenderness or deformity. Normal range of motion.     Comments: Negative phalens and tinels  Neurological:     General: No focal deficit present.     Mental Status: She is alert and oriented to person, place, and time.     Cranial Nerves: No cranial nerve deficit.     Motor: No weakness.  Psychiatric:        Mood and  Affect: Mood normal.        Behavior: Behavior normal.        Thought Content: Thought content normal.        Judgment: Judgment normal.         Assessment And Plan:     1. Numbness and tingling of right arm  Seen in ER no abnormal findings. Had CT scan  of head which was normal  This could be related to vitamin B12 deficiency  I have also discussed with her may be related to carpal tunnel vs radial nerve inflammation - she is to wear a wrist splint at bedtime to see if effective.  Negative phalens and tinels - Vitamin B12  2. Other depression  Depression screen seen below with score of 18  Will start her on wellbutrin which may also help with her weight  Denies previous history of seizures  Depression screen [facility] 2/9 01/28/2020 11/26/2019 10/24/2019 10/24/2019 03/23/2017  Decreased Interest 3 0 2 0 0  Down, Depressed, Hopeless 3 0 2 0 0  PHQ - 2 Score 6 0 4 0 0  Altered sleeping 3 - 3 - -  Tired, decreased energy 3 - 3 - -  Change in appetite 3 - 1 - -  Feeling bad or failure about yourself  0 - 3 - -  Trouble concentrating 2 - 3 - -  Moving slowly or fidgety/restless 1 - 0 - -  Suicidal thoughts 0 - 0 - -  PHQ-9 Score 18 - 17 - -  Difficult doing work/chores Extremely dIfficult - Very difficult - -   - buPROPion (WELLBUTRIN XL) 150 MG 24 hr tablet; Take 1 tablet (150 mg total) by mouth every morning.  Dispense: 30 tablet; Refill: 2  3. Class 3 severe obesity due to excess calories without serious comorbidity with body mass index (BMI) of 60.0 to 69.9 in adult [facility])  Chronic  Discussed healthy diet and regular exercise options   Encouraged to exercise at least 150 minutes per week with 2 days of strength training as tolerated - Vitamin B12 - buPROPion (WELLBUTRIN XL) 150 MG 24 hr tablet; Take 1 tablet (150 mg total) by mouth every morning.  Dispense: 30 tablet; Refill: 2  4. Tobacco abuse Smoking cessation instruction/counseling given:  counseled patient on the dangers  of tobacco use, advised patient to stop smoking, and reviewed strategies to maximize success     Patient was given opportunity to ask questions. Patient verbalized understanding of the plan and was able to repeat key elements of the plan. All questions were answered to their satisfaction.  [name], FNP   I, [name], FNP, have reviewed all documentation for this visit. The documentation on 12/22/20 for the exam, diagnosis, procedures, and orders are all accurate and complete.   IF YOU HAVE BEEN REFERRED TO A SPECIALIST, IT MAY TAKE 1-2 WEEKS TO SCHEDULE/PROCESS THE REFERRAL. IF YOU HAVE NOT HEARD FROM US/SPECIALIST IN TWO WEEKS, PLEASE GIVE [place] A CALL AT [phone].   THE PATIENT IS ENCOURAGED TO PRACTICE SOCIAL DISTANCING DUE TO THE COVID-19 PANDEMIC.

## 2020-12-23 LAB — VITAMIN B12: Vitamin B-12: 508 pg/mL (ref 232–1245)

## 2021-01-13 ENCOUNTER — Other Ambulatory Visit: Payer: Self-pay | Admitting: Nurse Practitioner

## 2021-01-13 DIAGNOSIS — F3289 Other specified depressive episodes: Secondary | ICD-10-CM

## 2021-01-13 DIAGNOSIS — Z6841 Body Mass Index (BMI) 40.0 and over, adult: Secondary | ICD-10-CM

## 2021-01-13 DIAGNOSIS — E66813 Obesity, class 3: Secondary | ICD-10-CM

## 2021-01-22 ENCOUNTER — Encounter: Payer: Self-pay | Admitting: Nurse Practitioner

## 2021-01-26 ENCOUNTER — Other Ambulatory Visit: Payer: Self-pay | Admitting: Nurse Practitioner

## 2021-01-26 DIAGNOSIS — R200 Anesthesia of skin: Secondary | ICD-10-CM

## 2021-02-03 ENCOUNTER — Other Ambulatory Visit: Payer: Self-pay

## 2021-02-03 ENCOUNTER — Ambulatory Visit (INDEPENDENT_AMBULATORY_CARE_PROVIDER_SITE_OTHER): Payer: BC Managed Care – PPO | Admitting: Nurse Practitioner

## 2021-02-03 ENCOUNTER — Encounter: Payer: Self-pay | Admitting: Nurse Practitioner

## 2021-02-03 VITALS — BP 124/80 | HR 96 | Temp 98.4°F | Ht 61.2 in | Wt 349.2 lb

## 2021-02-03 DIAGNOSIS — F3289 Other specified depressive episodes: Secondary | ICD-10-CM

## 2021-02-03 DIAGNOSIS — Z720 Tobacco use: Secondary | ICD-10-CM | POA: Diagnosis not present

## 2021-02-03 DIAGNOSIS — M79641 Pain in right hand: Secondary | ICD-10-CM | POA: Diagnosis not present

## 2021-02-03 DIAGNOSIS — Z6841 Body Mass Index (BMI) 40.0 and over, adult: Secondary | ICD-10-CM

## 2021-02-03 NOTE — Progress Notes (Signed)
I,Yamilka [employer] as a Education administrator for [employer], FNP.,have documented all relevant documentation on the behalf of [name], FNP,as directed by  [name], FNP while in the presence of [name], [place]. This visit occurred during the SARS-CoV-2 public health emergency.  Safety protocols were in place, including screening questions prior to the visit, additional usage of staff PPE, and extensive cleaning of exam room while observing appropriate contact time as indicated for disinfecting solutions.  Subjective:     Patient ID: April Ellis , female    DOB: 1976-02-14 , 45 y.o.   MRN: [id number]   Chief Complaint  Patient presents with  . Depression    HPI  Patient presents today for a depression f/u. She is tolerating the Wellbutrin well. She has noticed her appetite is lower. She has not started exercising and is planning to get a membership at [employer] since teenagers will be able to exercise for free. She feels like the medication has helped her mood. She reports smoking less 1-2 cigarettes a day, sometimes will skip a day.    Wt Readings from Last 3 Encounters: 02/03/21 : (!) 349 lb 3.2 oz (158.4 kg) 12/22/20 : (!) 359 lb (162.8 kg) 12/13/20 : (!) 352 lb (159.7 kg)    Past Medical History:  Diagnosis Date  . Anemia   . Asthma   . Bronchitis   . COPD (chronic obstructive pulmonary disease) ([place])   . Dyspnea    with asthma flair  . Obesity      Family History  Problem Relation Age of Onset  . Diabetes Mother   . Cancer Mother   . Diabetes Sister   . Hypertension Sister   . Hypertension Sister   . Urticaria Neg Hx   . Immunodeficiency Neg Hx   . Eczema Neg Hx   . Asthma Neg Hx   . Angioedema Neg Hx   . Allergic rhinitis Neg Hx      Current Outpatient Medications:  .  albuterol (VENTOLIN HFA) 108 (90 Base) MCG/ACT inhaler, Inhale 2 puffs into the lungs every 4 (four) hours as needed for wheezing or shortness of breath., Disp: 8 g, Rfl: 5 .   budesonide (PULMICORT) 1 MG/2ML nebulizer solution, Take 2 mLs by nebulization 4 (four) times daily as needed for wheezing., Disp: , Rfl:  .  budesonide-formoterol (SYMBICORT) 160-4.5 MCG/ACT inhaler, INHALE 2 PUFFS TWICE DAILY. RINSE MOUTH AFTER EACH USE, Disp: 30.6 Inhaler, Rfl: 5 .  buPROPion (WELLBUTRIN XL) 150 MG 24 hr tablet, TAKE 1 TABLET BY MOUTH EVERY DAY IN THE MORNING, Disp: 90 tablet, Rfl: 1 .  EPINEPHrine 0.3 mg/0.3 mL IJ SOAJ injection, Inject 0.3 mLs (0.3 mg total) into the muscle as needed for anaphylaxis., Disp: 2 each, Rfl: 5 .  fluticasone (FLONASE SENSIMIST) 27.5 MCG/SPRAY nasal spray, Place 2 sprays into the nose daily., Disp: 10 g, Rfl: 12 .  ipratropium-albuterol (DUONEB) 0.5-2.5 (3) MG/3ML SOLN, INHALE 1 VIAL BY MOUTH VIA NEBULIZER EVERY 6 HOURS AS NEEDED, Disp: 1080 mL, Rfl: 5 .  montelukast (SINGULAIR) 10 MG tablet, TAKE 1 TABLET BY MOUTH EVERYDAY AT BEDTIME (Patient taking differently: Take 10 mg by mouth at bedtime.), Disp: 90 tablet, Rfl: 1 .  nicotine (NICODERM CQ - DOSED IN MG/24 HOURS) 21 mg/24hr patch, Place 1 patch (21 mg total) onto the skin daily., Disp: 7 patch, Rfl: 0 .  albuterol (PROVENTIL) (2.5 MG/3ML) 0.083% nebulizer solution, Take 3 mLs by nebulization 4 (four) times daily. (Patient not taking:  Reported on 02/03/2021), Disp: , Rfl:  .  Benralizumab (FASENRA) 30 MG/ML SOSY, Inject 1 mL (30 mg total) into the skin every 8 (eight) weeks. (Patient not taking: Reported on 02/03/2021), Disp: 1 mL, Rfl: 5 .  ferrous sulfate 325 (65 FE) MG EC tablet, Take 1 tablet (325 mg total) by mouth in the morning and at bedtime. (Patient not taking: Reported on 02/03/2021), Disp: 90 tablet, Rfl: 1 .  SAXENDA 18 MG/3ML SOPN, INJECT 0.5MLS (3 MG) INTO THE SKIN DAILY. (Patient not taking: Reported on 02/03/2021), Disp: 9 mL, Rfl: 1 .  SPIRIVA HANDIHALER 18 MCG inhalation capsule, INHALE 1 CAPSULE VIA HANDIHALER ONCE DAILY AT THE SAME TIME EVERY DAY (Patient not taking: Reported on  02/03/2021), Disp: 30 capsule, Rfl: 5   Allergies  Allergen Reactions  . Penicillins     Childhood Allergy Did it involve swelling of the face/tongue/throat, SOB, or low BP? UNK Did it involve sudden or severe rash/hives, skin peeling, or any reaction on the inside of your mouth or nose? UNK Did you need to seek medical attention at a hospital or doctor's office? UNK When did it last happen?more than 10 years If all above answers are "NO", may proceed with cephalosporin use.      Review of Systems  Constitutional: Negative.  Negative for fatigue.  Eyes: Negative.   Respiratory: Negative.   Cardiovascular: Negative.  Negative for chest pain, palpitations and leg swelling.  Gastrointestinal: Negative.   Skin: Negative.   Neurological: Negative for dizziness and headaches.  Psychiatric/Behavioral: Negative.      Today's Vitals   02/03/21 1044  BP: 124/80  Pulse: 96  Temp: 98.4 F (36.9 C)  TempSrc: Oral  Weight: (!) 349 lb 3.2 oz (158.4 kg)  Height: 5' 1.2" (1.554 m)  PainSc: 0-No pain   Body mass index is 65.55 kg/m.   Objective:  Physical Exam Vitals reviewed.  Constitutional:      General: She is not in acute distress.    Appearance: Normal appearance. She is obese.  Cardiovascular:     Rate and Rhythm: Normal rate and regular rhythm.     Pulses: Normal pulses.     Heart sounds: Normal heart sounds. No murmur heard.   Pulmonary:     Effort: Pulmonary effort is normal. No respiratory distress.     Breath sounds: Normal breath sounds.  Musculoskeletal:        General: Swelling (right wrist posterior is slightly swollen) present. No tenderness or deformity. Normal range of motion.  Neurological:     General: No focal deficit present.     Mental Status: She is alert and oriented to person, place, and time. Mental status is at baseline.     Cranial Nerves: No cranial nerve deficit.     Motor: No weakness.  Psychiatric:        Mood and Affect: Mood normal.         Behavior: Behavior normal.        Thought Content: Thought content normal.        Judgment: Judgment normal.         Assessment And Plan:     1. Other depression  Chronic, improving with the wellbutrin   Will continue with current dose  2. Right hand pain  She has an appt next week with the hand specialist  Slight swelling to posterior wrist  3. Class 3 severe obesity due to excess calories without serious comorbidity with body mass index (BMI) of  60.0 to 69.9 in adult [facility])  Chronic  Discussed healthy diet and regular exercise options   Encouraged to exercise at least 150 minutes per week  Return in 2 months for weight check  4. Tobacco abuse  Reports she is not smoking as much   Patient was given opportunity to ask questions. Patient verbalized understanding of the plan and was able to repeat key elements of the plan. All questions were answered to their satisfaction.  [name], FNP   I, [name], FNP, have reviewed all documentation for this visit. The documentation on 02/03/21 for the exam, diagnosis, procedures, and orders are all accurate and complete.   IF YOU HAVE BEEN REFERRED TO A SPECIALIST, IT MAY TAKE 1-2 WEEKS TO SCHEDULE/PROCESS THE REFERRAL. IF YOU HAVE NOT HEARD FROM US/SPECIALIST IN TWO WEEKS, PLEASE GIVE [place] A CALL AT [phone].   THE PATIENT IS ENCOURAGED TO PRACTICE SOCIAL DISTANCING DUE TO THE COVID-19 PANDEMIC.

## 2021-02-03 NOTE — Patient Instructions (Signed)
   CONGRATULATIONS ON YOUR 10 LB WEIGHT LOSS, KEEP UP THE GOOD WORK!!  Incorporate more physical activity

## 2021-03-17 ENCOUNTER — Encounter: Payer: Self-pay | Admitting: Nurse Practitioner

## 2021-03-18 ENCOUNTER — Other Ambulatory Visit: Payer: Self-pay

## 2021-03-18 DIAGNOSIS — G5621 Lesion of ulnar nerve, right upper limb: Secondary | ICD-10-CM | POA: Insufficient documentation

## 2021-03-18 DIAGNOSIS — M79641 Pain in right hand: Secondary | ICD-10-CM | POA: Diagnosis not present

## 2021-03-18 MED ORDER — ALBUTEROL SULFATE HFA 108 (90 BASE) MCG/ACT IN AERS: 2.0000 | INHALATION_SPRAY | RESPIRATORY_TRACT | 5 refills | Status: DC | PRN

## 2021-03-25 DIAGNOSIS — G5621 Lesion of ulnar nerve, right upper limb: Secondary | ICD-10-CM | POA: Diagnosis not present

## 2021-04-01 DIAGNOSIS — G5621 Lesion of ulnar nerve, right upper limb: Secondary | ICD-10-CM | POA: Diagnosis not present

## 2021-04-10 DIAGNOSIS — M79641 Pain in right hand: Secondary | ICD-10-CM | POA: Diagnosis not present

## 2021-04-17 DIAGNOSIS — G5621 Lesion of ulnar nerve, right upper limb: Secondary | ICD-10-CM | POA: Diagnosis not present

## 2021-04-20 ENCOUNTER — Other Ambulatory Visit: Payer: Self-pay | Admitting: Internal Medicine

## 2021-04-21 DIAGNOSIS — G5621 Lesion of ulnar nerve, right upper limb: Secondary | ICD-10-CM | POA: Diagnosis not present

## 2021-05-07 ENCOUNTER — Encounter: Payer: Self-pay | Admitting: Nurse Practitioner

## 2021-05-07 ENCOUNTER — Ambulatory Visit (INDEPENDENT_AMBULATORY_CARE_PROVIDER_SITE_OTHER): Payer: BC Managed Care – PPO | Admitting: Nurse Practitioner

## 2021-05-07 ENCOUNTER — Other Ambulatory Visit: Payer: Self-pay

## 2021-05-07 VITALS — BP 122/68 | HR 91 | Temp 97.8°F | Ht 61.2 in | Wt 333.8 lb

## 2021-05-07 DIAGNOSIS — J4520 Mild intermittent asthma, uncomplicated: Secondary | ICD-10-CM | POA: Diagnosis not present

## 2021-05-07 DIAGNOSIS — Z6841 Body Mass Index (BMI) 40.0 and over, adult: Secondary | ICD-10-CM

## 2021-05-07 DIAGNOSIS — F3289 Other specified depressive episodes: Secondary | ICD-10-CM

## 2021-05-07 DIAGNOSIS — Z720 Tobacco use: Secondary | ICD-10-CM

## 2021-05-07 NOTE — Progress Notes (Signed)
I,Tianna Badgett,acting as a Education administrator for [employer], FNP.,have documented all relevant documentation on the behalf of [name], FNP,as directed by  [name], FNP while in the presence of [name], [place].  This visit occurred during the SARS-CoV-2 public health emergency.  Safety protocols were in place, including screening questions prior to the visit, additional usage of staff PPE, and extensive cleaning of exam room while observing appropriate contact time as indicated for disinfecting solutions.  Subjective:     Patient ID: April Ellis , female    DOB: 01/25/1976 , 45 y.o.   MRN: [id number]   Chief Complaint  Patient presents with   Depression    HPI  Patient presents today for a depression f/u. She is tolerating the Wellbutrin well.   Wt Readings from Last 3 Encounters: 05/07/21 : (!) 333 lb 12.8 oz (151.4 kg) 02/03/21 : (!) 349 lb 3.2 oz (158.4 kg) 12/22/20 : (!) 359 lb (162.8 kg)  Her nebulizer machine has broken and she needs another one. She is using 2 times a day average, she is also established with Pulmonary and is planning to schedule an appt with her for follow up    Depression        This is a chronic problem.  The current episode started more than 1 year ago.   The onset quality is sudden.   The problem occurs constantly.  Associated symptoms include no decreased concentration and no fatigue.   Past Medical History:  Diagnosis Date   Anemia    Asthma    Bronchitis    COPD (chronic obstructive pulmonary disease) ([place])    Dyspnea    with asthma flair   Obesity      Family History  Problem Relation Age of Onset   Diabetes Mother    Cancer Mother    Diabetes Sister    Hypertension Sister    Hypertension Sister    Urticaria Neg Hx    Immunodeficiency Neg Hx    Eczema Neg Hx    Asthma Neg Hx    Angioedema Neg Hx    Allergic rhinitis Neg Hx      Current Outpatient Medications:    albuterol (PROVENTIL) (2.5 MG/3ML) 0.083% nebulizer  solution, Take 3 mLs by nebulization 4 (four) times daily., Disp: , Rfl:    albuterol (VENTOLIN HFA) 108 (90 Base) MCG/ACT inhaler, Inhale 2 puffs into the lungs every 4 (four) hours as needed for wheezing or shortness of breath., Disp: 8 g, Rfl: 5   budesonide (PULMICORT) 1 MG/2ML nebulizer solution, Take 2 mLs by nebulization 4 (four) times daily as needed for wheezing., Disp: , Rfl:    budesonide-formoterol (SYMBICORT) 160-4.5 MCG/ACT inhaler, INHALE 2 PUFFS TWICE DAILY. RINSE MOUTH AFTER EACH USE, Disp: 30.6 each, Rfl: 0   buPROPion (WELLBUTRIN XL) 150 MG 24 hr tablet, TAKE 1 TABLET BY MOUTH EVERY DAY IN THE MORNING, Disp: 90 tablet, Rfl: 1   EPINEPHrine 0.3 mg/0.3 mL IJ SOAJ injection, Inject 0.3 mLs (0.3 mg total) into the muscle as needed for anaphylaxis., Disp: 2 each, Rfl: 5   ferrous sulfate 325 (65 FE) MG EC tablet, Take 1 tablet (325 mg total) by mouth in the morning and at bedtime. (Patient not taking: Reported on 02/03/2021), Disp: 90 tablet, Rfl: 1   fluticasone (FLONASE SENSIMIST) 27.5 MCG/SPRAY nasal spray, Place 2 sprays into the nose daily., Disp: 10 g, Rfl: 12   ipratropium-albuterol (DUONEB) 0.5-2.5 (3) MG/3ML SOLN, INHALE 1 VIAL BY MOUTH VIA  NEBULIZER EVERY 6 HOURS AS NEEDED, Disp: 1080 mL, Rfl: 5   montelukast (SINGULAIR) 10 MG tablet, TAKE 1 TABLET BY MOUTH EVERYDAY AT BEDTIME (Patient taking differently: Take 10 mg by mouth at bedtime.), Disp: 90 tablet, Rfl: 1   Allergies  Allergen Reactions   Penicillins     Childhood Allergy Did it involve swelling of the face/tongue/throat, SOB, or low BP? UNK Did it involve sudden or severe rash/hives, skin peeling, or any reaction on the inside of your mouth or nose? UNK Did you need to seek medical attention at a hospital or doctor's office? UNK When did it last happen?      more than 10 years If all above answers are "NO", may proceed with cephalosporin use.      Review of Systems  Constitutional: Negative.  Negative for  fatigue.  Respiratory: Negative.    Cardiovascular: Negative.   Gastrointestinal: Negative.   Neurological: Negative.   Psychiatric/Behavioral:  Positive for depression. Negative for decreased concentration.     Today's Vitals   05/07/21 1155  BP: 122/68  Pulse: 91  Temp: 97.8 F (36.6 C)  TempSrc: Oral  Weight: (!) 333 lb 12.8 oz (151.4 kg)  Height: 5' 1.2" (1.554 m)   Body mass index is 62.66 kg/m.  Wt Readings from Last 3 Encounters:  05/07/21 (!) 333 lb 12.8 oz (151.4 kg)  02/03/21 (!) 349 lb 3.2 oz (158.4 kg)  12/22/20 (!) 359 lb (162.8 kg)    Objective:  Physical Exam Vitals reviewed.  Constitutional:      General: She is not in acute distress.    Appearance: Normal appearance. She is obese.  Cardiovascular:     Rate and Rhythm: Normal rate and regular rhythm.     Pulses: Normal pulses.     Heart sounds: Normal heart sounds. No murmur heard. Pulmonary:     Effort: Pulmonary effort is normal. No respiratory distress.     Breath sounds: Normal breath sounds. No wheezing.  Neurological:     General: No focal deficit present.     Mental Status: She is alert and oriented to person, place, and time. Mental status is at baseline.     Cranial Nerves: No cranial nerve deficit.     Motor: No weakness.  Psychiatric:        Mood and Affect: Mood normal.        Behavior: Behavior normal.        Thought Content: Thought content normal.        Judgment: Judgment normal.        Assessment And Plan:     1. Other depression Comments: Doing well with wellbutrin No changes to medications - [id number]  2. Class 3 severe obesity due to excess calories without serious comorbidity with body mass index (BMI) of 60.0 to 69.9 in adult [facility]) Comments: Congratulated on 13 lb weight loss Currently on Wellbutrin for depression Encouraged to increase physical activity by aiming for at least 150 minutes of exercise per week. She is encouraged to strive for BMI less than 30 to  decrease cardiac risk.  We discussed briefly she may benefit from Bariatric surgery due to her comorbidity of asthma  3. Mild intermittent asthma, unspecified whether complicated Given Nebulizer machine Continue follow up with Pulmonary     Patient was given opportunity to ask questions. Patient verbalized understanding of the plan and was able to repeat key elements of the plan. All questions were answered to their satisfaction.  [name],  FNP   I, [name], FNP, have reviewed all documentation for this visit. The documentation on 05/07/21 for the exam, diagnosis, procedures, and orders are all accurate and complete.   IF YOU HAVE BEEN REFERRED TO A SPECIALIST, IT MAY TAKE 1-2 WEEKS TO SCHEDULE/PROCESS THE REFERRAL. IF YOU HAVE NOT HEARD FROM US/SPECIALIST IN TWO WEEKS, PLEASE GIVE [place] A CALL AT [phone].   THE PATIENT IS ENCOURAGED TO PRACTICE SOCIAL DISTANCING DUE TO THE COVID-19 PANDEMIC.

## 2021-05-07 NOTE — Patient Instructions (Signed)
[email or web address]">  Depression Screening Depression screening is a tool that your health care provider can use to learn if you have symptoms of depression. Depression is a common condition with many symptoms that are also often found in other conditions. Depression is treatable, but it must first be diagnosed. You may not know that certain feelings, thoughts, and behaviors that you are having can be symptoms of depression. Taking a depression screening test can help you and your health care provider decide if you need more assessment, or if you should be referredto a mental health care provider. What are the screening tests? You may have a physical exam to see if another condition is affecting your mental health. You may have a blood or urine sample taken during the physical exam. You may be interviewed using a screening tool that was developed from research, such as one of these: Patient Health Questionnaire (PHQ). This is a set of either 2 or 9 questions. A health care provider who has been trained to score this screening test uses a guide to assess if your symptoms suggest that you may have depression. Hamilton Depression Rating Scale (HAM-D). This is a set of either 17 or 24 questions. You may be asked to take it again during or after your treatment, to see if your depression has gotten better. Beck Depression Inventory (BDI). This is a set of 21 multiple choice questions. Your health care provider scores your answers to assess: Your level of depression, ranging from mild to severe. Your response to treatment. Your health care provider may talk with you about your daily activities, such as eating, sleeping, work, and recreation, and ask if you have had any changes in activity. Your health care provider may ask you to see a mental health specialist, such as a psychiatrist or psychologist, for more evaluation. Who should be  screened for depression?  All adults, including adults with a family history of a mental health disorder. Adolescents who are 10-9 years old. People who are recovering from a myocardial infarction (MI). Pregnant women, or women who have given birth. People who have a long-term (chronic) illness. Anyone who has been diagnosed with another type of a mental health disorder. Anyone who has symptoms that could show depression. What do my results mean? Your health care provider will review the results of your depression screening, physical exam, and lab tests. Positive screens suggest that you may have depression. Screening is the first step in getting the care that you may need. It is up to you to get your screening results. Ask your health care provider, or the department that is doing your screening tests, when your results will beready. Talk with your health care provider about your results and diagnosis. A diagnosis of depression is made using the Diagnostic and Statistical Manual of Mental Disorders (DSM-V). This is a book that lists the number and type of symptoms that mustbe present for a health care provider to give a specific diagnosis.  Your health care provider may work with you to treat your symptoms of depression, or your health care provider may help you find a mental healthprovider who can assess, diagnose, and treat your depression. Get help right away if: You have thoughts about hurting yourself or others. If you ever feel like you may hurt yourself or others, or have thoughts about taking your own life, get help right away. You can go to your nearest emergency department or call: Your local emergency services (911 in the [place] A suicide  crisis helpline, such as the [place] at 818-669-2201. This is open 24 hours a day. Summary Depression screening is the first step in getting the help that you may need. If your screening test shows symptoms of  depression (is positive), your health care provider may ask you to see a mental health provider. Anyone who is age 44 or older should be screened for depression. This information is not intended to replace advice given to you by your health care provider. Make sure you discuss any questions you have with your healthcare provider. Document Revised: 08/27/2020 Document Reviewed: 03/06/2020 Elsevier Patient Education  [place].

## 2021-05-08 LAB — CMP14+EGFR
ALT: 8 IU/L (ref 0–32)
AST: 14 IU/L (ref 0–40)
Albumin/Globulin Ratio: 1.2 (ref 1.2–2.2)
Albumin: 4 g/dL (ref 3.8–4.8)
Alkaline Phosphatase: 102 IU/L (ref 44–121)
BUN/Creatinine Ratio: 13 (ref 9–23)
BUN: 10 mg/dL (ref 6–24)
Bilirubin Total: 0.7 mg/dL (ref 0.0–1.2)
CO2: 26 mmol/L (ref 20–29)
Calcium: 9.1 mg/dL (ref 8.7–10.2)
Chloride: 100 mmol/L (ref 96–106)
Creatinine, Ser: 0.76 mg/dL (ref 0.57–1.00)
Globulin, Total: 3.3 g/dL (ref 1.5–4.5)
Glucose: 66 mg/dL (ref 65–99)
Potassium: 4 mmol/L (ref 3.5–5.2)
Sodium: 141 mmol/L (ref 134–144)
Total Protein: 7.3 g/dL (ref 6.0–8.5)
eGFR: 100 mL/min/{1.73_m2} (ref >59)

## 2021-05-26 ENCOUNTER — Encounter: Payer: Self-pay | Admitting: Nurse Practitioner

## 2021-05-26 ENCOUNTER — Other Ambulatory Visit: Payer: Self-pay

## 2021-05-26 DIAGNOSIS — J449 Chronic obstructive pulmonary disease, unspecified: Secondary | ICD-10-CM

## 2021-05-26 DIAGNOSIS — J4541 Moderate persistent asthma with (acute) exacerbation: Secondary | ICD-10-CM

## 2021-05-26 MED ORDER — IPRATROPIUM-ALBUTEROL 0.5-2.5 (3) MG/3ML IN SOLN: RESPIRATORY_TRACT | 5 refills | Status: DC

## 2021-07-05 ENCOUNTER — Other Ambulatory Visit: Payer: Self-pay | Admitting: Nurse Practitioner

## 2021-07-05 DIAGNOSIS — Z6841 Body Mass Index (BMI) 40.0 and over, adult: Secondary | ICD-10-CM

## 2021-07-05 DIAGNOSIS — F3289 Other specified depressive episodes: Secondary | ICD-10-CM

## 2021-07-09 ENCOUNTER — Encounter: Payer: Self-pay | Admitting: Nurse Practitioner

## 2021-07-09 ENCOUNTER — Other Ambulatory Visit: Payer: Self-pay | Admitting: Nurse Practitioner

## 2021-07-20 ENCOUNTER — Encounter: Payer: Self-pay | Admitting: Nurse Practitioner

## 2021-07-21 ENCOUNTER — Other Ambulatory Visit: Payer: Self-pay

## 2021-07-21 DIAGNOSIS — J449 Chronic obstructive pulmonary disease, unspecified: Secondary | ICD-10-CM

## 2021-07-21 DIAGNOSIS — R599 Enlarged lymph nodes, unspecified: Secondary | ICD-10-CM

## 2021-07-21 DIAGNOSIS — J4541 Moderate persistent asthma with (acute) exacerbation: Secondary | ICD-10-CM

## 2021-07-21 MED ORDER — FLONASE SENSIMIST 27.5 MCG/SPRAY NA SUSP: 2.0000 | Freq: Every day | NASAL | 12 refills | Status: DC

## 2021-07-21 MED ORDER — ALBUTEROL SULFATE (2.5 MG/3ML) 0.083% IN NEBU: 3.0000 mL | INHALATION_SOLUTION | Freq: Four times a day (QID) | RESPIRATORY_TRACT | 1 refills | Status: DC

## 2021-07-21 MED ORDER — BUDESONIDE 1 MG/2ML IN SUSP: 1.0000 mg | Freq: Four times a day (QID) | RESPIRATORY_TRACT | 1 refills | Status: DC | PRN

## 2021-07-21 MED ORDER — IPRATROPIUM-ALBUTEROL 0.5-2.5 (3) MG/3ML IN SOLN: RESPIRATORY_TRACT | 5 refills | Status: DC

## 2021-07-21 MED ORDER — BUDESONIDE-FORMOTEROL FUMARATE 160-4.5 MCG/ACT IN AERO: INHALATION_SPRAY | RESPIRATORY_TRACT | 0 refills | Status: DC

## 2021-07-21 MED ORDER — ALBUTEROL SULFATE HFA 108 (90 BASE) MCG/ACT IN AERS: 2.0000 | INHALATION_SPRAY | RESPIRATORY_TRACT | 5 refills | Status: DC | PRN

## 2021-08-10 ENCOUNTER — Telehealth: Payer: Self-pay

## 2021-08-10 ENCOUNTER — Encounter: Payer: Self-pay | Admitting: Nurse Practitioner

## 2021-08-10 NOTE — Telephone Encounter (Signed)
Pt advised to go to ED for SOB.

## 2021-08-25 ENCOUNTER — Other Ambulatory Visit: Payer: Self-pay

## 2021-08-25 ENCOUNTER — Ambulatory Visit (INDEPENDENT_AMBULATORY_CARE_PROVIDER_SITE_OTHER): Payer: BC Managed Care – PPO | Admitting: Nurse Practitioner

## 2021-08-25 ENCOUNTER — Encounter: Payer: Self-pay | Admitting: Nurse Practitioner

## 2021-08-25 VITALS — BP 112/70 | HR 104 | Temp 98.4°F | Ht 60.0 in | Wt 349.8 lb

## 2021-08-25 DIAGNOSIS — Z0001 Encounter for general adult medical examination with abnormal findings: Secondary | ICD-10-CM | POA: Diagnosis not present

## 2021-08-25 DIAGNOSIS — Z23 Encounter for immunization: Secondary | ICD-10-CM

## 2021-08-25 DIAGNOSIS — D509 Iron deficiency anemia, unspecified: Secondary | ICD-10-CM

## 2021-08-25 DIAGNOSIS — Z6841 Body Mass Index (BMI) 40.0 and over, adult: Secondary | ICD-10-CM | POA: Diagnosis not present

## 2021-08-25 DIAGNOSIS — Z0000 Encounter for general adult medical examination without abnormal findings: Secondary | ICD-10-CM

## 2021-08-25 DIAGNOSIS — R0689 Other abnormalities of breathing: Secondary | ICD-10-CM

## 2021-08-25 DIAGNOSIS — Z720 Tobacco use: Secondary | ICD-10-CM

## 2021-08-25 DIAGNOSIS — J4541 Moderate persistent asthma with (acute) exacerbation: Secondary | ICD-10-CM

## 2021-08-25 DIAGNOSIS — L732 Hidradenitis suppurativa: Secondary | ICD-10-CM

## 2021-08-25 DIAGNOSIS — Z2821 Immunization not carried out because of patient refusal: Secondary | ICD-10-CM

## 2021-08-25 DIAGNOSIS — J449 Chronic obstructive pulmonary disease, unspecified: Secondary | ICD-10-CM

## 2021-08-25 MED ORDER — IPRATROPIUM-ALBUTEROL 0.5-2.5 (3) MG/3ML IN SOLN: RESPIRATORY_TRACT | 5 refills | Status: DC

## 2021-08-25 MED ORDER — DOXYCYCLINE MONOHYDRATE 100 MG PO CAPS: 100.0000 mg | ORAL_CAPSULE | Freq: Two times a day (BID) | ORAL | 0 refills | Status: DC

## 2021-08-25 MED ORDER — EPINEPHRINE 0.3 MG/0.3ML IJ SOAJ: 0.3000 mg | INTRAMUSCULAR | 5 refills | Status: DC | PRN

## 2021-08-25 NOTE — Patient Instructions (Addendum)
Health Maintenance, Female Adopting a healthy lifestyle and getting preventive care are important in promoting health and wellness. Ask your health care provider about: The right schedule for you to have regular tests and exams. Things you can do on your own to prevent diseases and keep yourself healthy. What should I know about diet, weight, and exercise? Eat a healthy diet  Eat a diet that includes plenty of vegetables, fruits, low-fat dairy products, and lean protein. Do not eat a lot of foods that are high in solid fats, added sugars, or sodium. Maintain a healthy weight Body mass index (BMI) is used to identify weight problems. It estimates body fat based on height and weight. Your health care provider can help determine your BMI and help you achieve or maintain a healthy weight. Get regular exercise Get regular exercise. This is one of the most important things you can do for your health. Most adults should: Exercise for at least 150 minutes each week. The exercise should increase your heart rate and make you sweat (moderate-intensity exercise). Do strengthening exercises at least twice a week. This is in addition to the moderate-intensity exercise. Spend less time sitting. Even light physical activity can be beneficial. Watch cholesterol and blood lipids Have your blood tested for lipids and cholesterol at 44 years of age, then have this test every 5 years. Have your cholesterol levels checked more often if: Your lipid or cholesterol levels are high. You are older than 44 years of age. You are at high risk for heart disease. What should I know about cancer screening? Depending on your health history and family history, you may need to have cancer screening at various ages. This may include screening for: Breast cancer. Cervical cancer. Colorectal cancer. Skin cancer. Lung cancer. What should I know about heart disease, diabetes, and high blood pressure? Blood pressure and heart  disease High blood pressure causes heart disease and increases the risk of stroke. This is more likely to develop in people who have high blood pressure readings or are overweight. Have your blood pressure checked: Every 3-5 years if you are 18-39 years of age. Every year if you are 40 years old or older. Diabetes Have regular diabetes screenings. This checks your fasting blood sugar level. Have the screening done: Once every three years after age 40 if you are at a normal weight and have a low risk for diabetes. More often and at a younger age if you are overweight or have a high risk for diabetes. What should I know about preventing infection? Hepatitis B If you have a higher risk for hepatitis B, you should be screened for this virus. Talk with your health care provider to find out if you are at risk for hepatitis B infection. Hepatitis C Testing is recommended for: Everyone born from 1945 through 1965. Anyone with known risk factors for hepatitis C. Sexually transmitted infections (STIs) Get screened for STIs, including gonorrhea and chlamydia, if: You are sexually active and are younger than 44 years of age. You are older than 44 years of age and your health care provider tells you that you are at risk for this type of infection. Your sexual activity has changed since you were last screened, and you are at increased risk for chlamydia or gonorrhea. Ask your health care provider if you are at risk. Ask your health care provider about whether you are at high risk for HIV. Your health care provider may recommend a prescription medicine to help prevent HIV   infection. If you choose to take medicine to prevent HIV, you should first get tested for HIV. You should then be tested every 3 months for as long as you are taking the medicine. Pregnancy If you are about to stop having your period (premenopausal) and you may become pregnant, seek counseling before you get pregnant. Take 400 to 800  micrograms (mcg) of folic acid every day if you become pregnant. Ask for birth control (contraception) if you want to prevent pregnancy. Osteoporosis and menopause Osteoporosis is a disease in which the bones lose minerals and strength with aging. This can result in bone fractures. If you are 42 years old or older, or if you are at risk for osteoporosis and fractures, ask your health care provider if you should: Be screened for bone loss. Take a calcium or vitamin D supplement to lower your risk of fractures. Be given hormone replacement therapy (HRT) to treat symptoms of menopause. Follow these instructions at home: Alcohol use Do not drink alcohol if: Your health care provider tells you not to drink. You are pregnant, may be pregnant, or are planning to become pregnant. If you drink alcohol: Limit how much you have to: 0-1 drink a day. Know how much alcohol is in your drink. In the [place] one drink equals one 12 oz bottle of beer (355 mL), one 5 oz glass of wine (148 mL), or one 1 oz glass of hard liquor (44 mL). Lifestyle Do not use any products that contain nicotine or tobacco. These products include cigarettes, chewing tobacco, and vaping devices, such as e-cigarettes. If you need help quitting, ask your health care provider. Do not use street drugs. Do not share needles. Ask your health care provider for help if you need support or information about quitting drugs. General instructions Schedule regular health, dental, and eye exams. Stay current with your vaccines. Tell your health care provider if: You often feel depressed. You have ever been abused or do not feel safe at home. Summary Adopting a healthy lifestyle and getting preventive care are important in promoting health and wellness. Follow your health care provider's instructions about healthy diet, exercising, and getting tested or screened for diseases. Follow your health care provider's instructions on monitoring your  cholesterol and blood pressure. This information is not intended to replace advice given to you by your health care provider. Make sure you discuss any questions you have with your health care provider. Document Revised: 02/02/2021 Document Reviewed: 02/02/2021 Elsevier Patient Education  2022 [employer].  Exercising to Lose Weight Getting regular exercise is important for everyone. It is especially important if you are overweight. Being overweight increases your risk of heart disease, stroke, diabetes, high blood pressure, and several types of cancer. Exercising, and reducing the calories you consume, can help you lose weight and improve fitness and health. Exercise can be moderate or vigorous intensity. To lose weight, most people need to do a certain amount of moderate or vigorous-intensity exercise each week. How can exercise affect me? You lose weight when you exercise enough to burn more calories than you eat. Exercise also reduces body fat and builds muscle. The more muscle you have, the more calories you burn. Exercise also: Improves mood. Reduces stress and tension. Improves your overall fitness, flexibility, and endurance. Increases bone strength. Moderate-intensity exercise Moderate-intensity exercise is any activity that gets you moving enough to burn at least three times more energy (calories) than if you were sitting. Examples of moderate exercise include: Walking a mile  in 15 minutes. Doing light yard work. Biking at an easy pace. Most people should get at least 150 minutes of moderate-intensity exercise a week to maintain their body weight. Vigorous-intensity exercise Vigorous-intensity exercise is any activity that gets you moving enough to burn at least six times more calories than if you were sitting. When you exercise at this intensity, you should be working hard enough that you are not able to carry on a conversation. Examples of vigorous exercise  include: Running. Playing a team sport, such as football, basketball, and soccer. Jumping rope. Most people should get at least 75 minutes a week of vigorous exercise to maintain their body weight. What actions can I take to lose weight? The amount of exercise you need to lose weight depends on: Your age. The type of exercise. Any health conditions you have. Your overall physical ability. Talk to your health care provider about how much exercise you need and what types of activities are safe for you. Nutrition  Make changes to your diet as told by your health care provider or diet and nutrition specialist (dietitian). This may include: Eating fewer calories. Eating more protein. Eating less unhealthy fats. Eating a diet that includes fresh fruits and vegetables, whole grains, low-fat dairy products, and lean protein. Avoiding foods with added fat, salt, and sugar. Drink plenty of water while you exercise to prevent dehydration or heat stroke. Activity Choose an activity that you enjoy and set realistic goals. Your health care provider can help you make an exercise plan that works for you. Exercise at a moderate or vigorous intensity most days of the week. The intensity of exercise may vary from person to person. You can tell how intense a workout is for you by paying attention to your breathing and heartbeat. Most people will notice their breathing and heartbeat get faster with more intense exercise. Do resistance training twice each week, such as: Push-ups. Sit-ups. Lifting weights. Using resistance bands. Getting short amounts of exercise can be just as helpful as long, structured periods of exercise. If you have trouble finding time to exercise, try doing these things as part of your daily routine: Get up, stretch, and walk around every 30 minutes throughout the day. Go for a walk during your lunch break. Park your car farther away from your destination. If you take public  transportation, get off one stop early and walk the rest of the way. Make phone calls while standing up and walking around. Take the stairs instead of elevators or escalators. Wear comfortable clothes and shoes with good support. Do not exercise so much that you hurt yourself, feel dizzy, or get very short of breath. Where to find more information [place] Department of Health and Human Services: [employer]at Centers for Disease Control and Prevention: http://www.wolf.info/ Contact a health care provider: Before starting a new exercise program. If you have questions or concerns about your weight. If you have a medical problem that keeps you from exercising. Get help right away if: You have any of the following while exercising: Injury. Dizziness. Difficulty breathing or shortness of breath that does not go away when you stop exercising. Chest pain. Rapid heartbeat. These symptoms may represent a serious problem that is an emergency. Do not wait to see if the symptoms will go away. Get medical help right away. Call your local emergency services (911 in the [place] Do not drive yourself to the hospital. Summary Getting regular exercise is especially important if you are overweight. Being overweight increases your  risk of heart disease, stroke, diabetes, high blood pressure, and several types of cancer. Losing weight happens when you burn more calories than you eat. Reducing the amount of calories you eat, and getting regular moderate or vigorous exercise each week, helps you lose weight. This information is not intended to replace advice given to you by your health care provider. Make sure you discuss any questions you have with your health care provider. Document Revised: 11/09/2020 Document Reviewed: 11/09/2020 Elsevier Patient Education  2022 [place].  Hidradenitis Suppurativa Hidradenitis suppurativa is a long-term (chronic) skin disease. It is similar to a severe form of acne, but it affects  areas of the body where acne would be unusual, especially areas of the body where skin rubs against skin and becomes moist. These include: Underarms. Groin. Genital area. Buttocks. Upper thighs. Breasts. Hidradenitis suppurativa may start out as small lumps or pimples caused by blocked sweat glands or hair follicles. Pimples may develop into deep sores that break open (rupture) and drain pus. Over time, affected areas of skin may thicken and become scarred. This condition is rare and does not spread from person to person (non-contagious). What are the causes? The exact cause of this condition is not known. It may be related to: Female and female hormones. An overactive disease-fighting system (immune system). The immune system may over-react to blocked hair follicles or sweat glands and cause swelling and pus-filled sores. What increases the risk? You are more likely to develop this condition if you: Are female. Are 63-28 years old. Have a family history of hidradenitis suppurativa. Have a personal history of acne. Are overweight. Smoke. Take the medicine lithium. What are the signs or symptoms? The first symptoms are usually painful bumps in the skin, similar to pimples. The condition may get worse over time (progress), or it may only cause mild symptoms. If the disease progresses, symptoms may include: Skin bumps getting bigger and growing deeper into the skin. Bumps rupturing and draining pus. Itchy, infected skin. Skin getting thicker and scarred. Tunnels under the skin (fistulas) where pus drains from a bump. Pain during daily activities, such as pain during walking if your groin area is affected. Emotional problems, such as stress or depression. This condition may affect your appearance and your ability or willingness to wear certain clothes or do certain activities. How is this diagnosed? This condition is diagnosed by a health care provider who specializes in skin diseases  (dermatologist). You may be diagnosed based on: Your symptoms and medical history. A physical exam. Testing a pus sample for infection. Blood tests. How is this treated? Your treatment will depend on how severe your symptoms are. The same treatment will not work for everybody with this condition. You may need to try several treatments to find what works best for you. Treatment may include: Cleaning and bandaging (dressing) your wounds as needed. Lifestyle changes, such as new skin care routines. Taking medicines, such as: Antibiotics. Acne medicines. Medicines to reduce the activity of the immune system. A diabetes medicine (metformin). Birth control pills, for women. Steroids to reduce swelling and pain. Working with a mental health care provider, if you experience emotional distress due to this condition. If you have severe symptoms that do not get better with medicine, you may need surgery. Surgery may involve: Using a laser to clear the skin and remove hair follicles. Opening and draining deep sores. Removing the areas of skin that are diseased and scarred. Follow these instructions at home: Medicines  Take  over-the-counter and prescription medicines only as told by your health care provider. If you were prescribed an antibiotic medicine, take it as told by your health care provider. Do not stop taking the antibiotic even if your condition improves. Skin care If you have open wounds, cover them with a clean dressing as told by your health care provider. Keep wounds clean by washing them gently with soap and water when you bathe. Do not shave the areas where you get hidradenitis suppurativa. Do not wear deodorant. Wear loose-fitting clothes. Try to avoid getting overheated or sweaty. If you get sweaty or wet, change into clean, dry clothes as soon as you can. To help relieve pain and itchiness, cover sore areas with a warm, clean washcloth (warm compress) for 5-10 minutes as often  as needed. If told by your health care provider, take a bleach bath twice a week: Fill your bathtub halfway with water. Pour in  cup of unscented household bleach. Soak in the tub for 5-10 minutes. Only soak from the neck down. Avoid water on your face and hair. Shower to rinse off the bleach from your skin. General instructions Learn as much as you can about your disease so that you have an active role in your treatment. Work closely with your health care provider to find treatments that work for you. If you are overweight, work with your health care provider to lose weight as recommended. Do not use any products that contain nicotine or tobacco, such as cigarettes and e-cigarettes. If you need help quitting, ask your health care provider. If you struggle with living with this condition, talk with your health care provider or work with a mental health care provider as recommended. Keep all follow-up visits as told by your health care provider. This is important. Where to find more information Hidradenitis [place].: https://www.hs-foundation.org/ American Academy of Dermatology: http://www.nguyen-hutchinson.com/ Contact a health care provider if you have: A flare-up of hidradenitis suppurativa. A fever or chills. Trouble controlling your symptoms at home. Trouble doing your daily activities because of your symptoms. Trouble dealing with emotional problems related to your condition. Summary Hidradenitis suppurativa is a long-term (chronic) skin disease. It is similar to a severe form of acne, but it affects areas of the body where acne would be unusual. The first symptoms are usually painful bumps in the skin, similar to pimples. The condition may only cause mild symptoms, or it may get worse over time (progress). If you have open wounds, cover them with a clean dressing as told by your health care provider. Keep wounds clean by washing them gently with soap and water when you  bathe. Besides skin care, treatment may include medicines, laser treatment, and surgery. This information is not intended to replace advice given to you by your health care provider. Make sure you discuss any questions you have with your health care provider. Document Revised: 07/08/2020 Document Reviewed: 07/08/2020 Elsevier Patient Education  2022 [employer].

## 2021-08-25 NOTE — Progress Notes (Signed)
I,Katawbba Stoffer,acting as a Education administrator for [employer], FNP.,have documented all relevant documentation on the behalf of [name], FNP,as directed by  [name], FNP while in the presence of [name], [place].   This visit occurred during the SARS-CoV-2 public health emergency.  Safety protocols were in place, including screening questions prior to the visit, additional usage of staff PPE, and extensive cleaning of exam room while observing appropriate contact time as indicated for disinfecting solutions.  Subjective:     Patient ID: April Ellis , female    DOB: 1975-11-10 , 45 y.o.   MRN: [id number]   Chief Complaint  Patient presents with   Annual Exam    HPI  The patient is here for a physical.  The patient is followed by GYN, last pap 2021.  Wt Readings from Last 3 Encounters: 08/25/21 : (!) 349 lb 12.8 oz (158.7 kg) 05/07/21 : (!) 333 lb 12.8 oz (151.4 kg) 02/03/21 : (!) 349 lb 3.2 oz (158.4 kg)      Past Medical History:  Diagnosis Date   Anemia    Asthma    Bronchitis    COPD (chronic obstructive pulmonary disease) (HCC)    Dyspnea    with asthma flair   Obesity    Respiratory arrest ([place]) 04/04/2020     Family History  Problem Relation Age of Onset   Diabetes Mother    Cancer Mother    Diabetes Sister    Hypertension Sister    Hypertension Sister    Urticaria Neg Hx    Immunodeficiency Neg Hx    Eczema Neg Hx    Asthma Neg Hx    Angioedema Neg Hx    Allergic rhinitis Neg Hx      Current Outpatient Medications:    albuterol (PROVENTIL) (2.5 MG/3ML) 0.083% nebulizer solution, Take 3 mLs by nebulization 4 (four) times daily., Disp: 75 mL, Rfl: 1   albuterol (VENTOLIN HFA) 108 (90 Base) MCG/ACT inhaler, Inhale 2 puffs into the lungs every 4 (four) hours as needed for wheezing or shortness of breath., Disp: 8.5 each, Rfl: 5   budesonide (PULMICORT) 1 MG/2ML nebulizer solution, Take 2 mLs (1 mg total) by nebulization 4 (four) times daily as needed.,  Disp: 60 mL, Rfl: 1   budesonide-formoterol (SYMBICORT) 160-4.5 MCG/ACT inhaler, INHALE 2 PUFFS TWICE DAILY. RINSE MOUTH AFTER EACH USE, Disp: 30.6 each, Rfl: 0   buPROPion (WELLBUTRIN XL) 150 MG 24 hr tablet, TAKE 1 TABLET BY MOUTH EVERY DAY IN THE MORNING, Disp: 90 tablet, Rfl: 1   doxycycline (MONODOX) 100 MG capsule, Take 1 capsule (100 mg total) by mouth 2 (two) times daily., Disp: 14 capsule, Rfl: 0   EPINEPHrine 0.3 mg/0.3 mL IJ SOAJ injection, Inject 0.3 mg into the muscle as needed for anaphylaxis., Disp: 2 each, Rfl: 5   fluticasone (FLONASE SENSIMIST) 27.5 MCG/SPRAY nasal spray, Place 2 sprays into the nose daily., Disp: 10 g, Rfl: 12   ipratropium-albuterol (DUONEB) 0.5-2.5 (3) MG/3ML SOLN, INHALE 1 VIAL BY MOUTH VIA NEBULIZER EVERY 6 HOURS AS NEEDED, Disp: 1080 mL, Rfl: 5   montelukast (SINGULAIR) 10 MG tablet, TAKE 1 TABLET BY MOUTH EVERYDAY AT BEDTIME (Patient taking differently: Take 10 mg by mouth at bedtime.), Disp: 90 tablet, Rfl: 1   Allergies  Allergen Reactions   Penicillins     Childhood Allergy Did it involve swelling of the face/tongue/throat, SOB, or low BP? UNK Did it involve sudden or severe rash/hives, skin peeling, or any reaction on  the inside of your mouth or nose? UNK Did you need to seek medical attention at a hospital or doctor's office? UNK When did it last happen?      more than 10 years If all above answers are "NO", may proceed with cephalosporin use.       The patient states she uses  ablation  for birth control. Last LMP was No LMP recorded. Patient has had an ablation.. Negative for: breast discharge, breast lump(s), breast pain and breast self exam. Associated symptoms include abnormal vaginal bleeding. Pertinent negatives include abnormal bleeding (hematology), anxiety, decreased libido, depression, difficulty falling sleep, dyspareunia, history of infertility, nocturia, sexual dysfunction, sleep disturbances, urinary incontinence, urinary urgency,  vaginal discharge and vaginal itching. Diet regular; admits to not having a plan right now. She feels she may be snacking more. She has been on prednisone a couple times since her last visit. She reports having a lot going on. The patient states her exercise level is none.   The patient's tobacco use is:  Social History   Tobacco Use  Smoking Status Light Smoker   Packs/day: 0.25   Years: 15.00   Pack years: 3.75   Types: Cigarettes   Last attempt to quit: 09/16/2014   Years since quitting: 6.9  Smokeless Tobacco Never  Tobacco Comments   she continues to smoke 1-2 cigarettes a day   She has been exposed to passive smoke. The patient's alcohol use is:  Social History   Substance and Sexual Activity  Alcohol Use Yes   Alcohol/week: 0.0 standard drinks   Comment: occas.   Additional information: Last pap 2021, next one scheduled for 2024.    Review of Systems  Constitutional: Negative.   HENT: Negative.    Eyes: Negative.   Respiratory: Negative.    Cardiovascular: Negative.   Gastrointestinal: Negative.   Endocrine: Negative.   Genitourinary: Negative.   Musculoskeletal: Negative.   Skin: Negative.   Allergic/Immunologic: Negative.   Neurological: Negative.   Hematological: Negative.   Psychiatric/Behavioral: Negative.      Today's Vitals   08/25/21 1454  BP: 112/70  Pulse: (!) 104  Temp: 98.4 F (36.9 C)  Weight: (!) 349 lb 12.8 oz (158.7 kg)  Height: 5' (1.524 m)   Body mass index is 68.32 kg/m.  Wt Readings from Last 3 Encounters:  08/25/21 (!) 349 lb 12.8 oz (158.7 kg)  05/07/21 (!) 333 lb 12.8 oz (151.4 kg)  02/03/21 (!) 349 lb 3.2 oz (158.4 kg)    BP Readings from Last 3 Encounters:  08/25/21 112/70  05/07/21 122/68  02/03/21 124/80    Objective:  Physical Exam Vitals reviewed.  Constitutional:      General: She is not in acute distress.    Appearance: Normal appearance. She is well-developed. She is obese.     Comments: Morbid obesity   HENT:     Head: Normocephalic and atraumatic.     Right Ear: Hearing, tympanic membrane, ear canal and external ear normal. There is no impacted cerumen.     Left Ear: Hearing, tympanic membrane, ear canal and external ear normal. There is no impacted cerumen.     Nose:     Comments: Deferred - masked    Mouth/Throat:     Comments: Deferred - masked Eyes:     General: Lids are normal.     Extraocular Movements: Extraocular movements intact.     Conjunctiva/sclera: Conjunctivae normal.     Pupils: Pupils are equal, round, and reactive to  light.     Funduscopic exam:    Right eye: No papilledema.        Left eye: No papilledema.  Neck:     Thyroid: No thyroid mass.     Vascular: No carotid bruit.  Cardiovascular:     Rate and Rhythm: Normal rate and regular rhythm.     Pulses: Normal pulses.     Heart sounds: Normal heart sounds. No murmur heard. Pulmonary:     Effort: Pulmonary effort is normal. No respiratory distress.     Breath sounds: Normal breath sounds. No wheezing.  Chest:     Chest wall: No mass.  Breasts:    Tanner Score is 5.     Right: Normal. No mass or tenderness.     Left: Normal. No mass or tenderness.  Abdominal:     General: Abdomen is flat. Bowel sounds are normal. There is no distension.     Palpations: Abdomen is soft.     Tenderness: There is no abdominal tenderness.  Genitourinary:    Rectum: Guaiac result negative.  Musculoskeletal:        General: No swelling or tenderness. Normal range of motion.     Cervical back: Full passive range of motion without pain, normal range of motion and neck supple.     Right lower leg: No edema.     Left lower leg: No edema.  Lymphadenopathy:     Upper Body:     Right upper body: No supraclavicular, axillary or pectoral adenopathy.     Left upper body: No supraclavicular, axillary or pectoral adenopathy.  Skin:    General: Skin is warm and dry.     Capillary Refill: Capillary refill takes less than 2 seconds.   Neurological:     General: No focal deficit present.     Mental Status: She is alert and oriented to person, place, and time.     Cranial Nerves: No cranial nerve deficit.     Sensory: No sensory deficit.     Motor: No weakness.  Psychiatric:        Mood and Affect: Mood normal.        Behavior: Behavior normal.        Thought Content: Thought content normal.        Judgment: Judgment normal.        Assessment And Plan:     1. Routine general medical examination at a health care facility Behavior modifications discussed and diet history reviewed.   Pt will continue to exercise regularly and modify diet with low GI, plant based foods and decrease intake of processed foods.  Recommend intake of daily multivitamin, Vitamin D, and calcium.  Recommend mammogram and colonoscopy for preventive screenings, as well as recommend immunizations that include influenza, TDAP  - Lipid panel - CMP14+EGFR  2. Need for vaccination Will give tetanus vaccine today while in office. Refer to order management. TDAP will be administered to adults 41-64 years old every 10 years. - Tdap vaccine greater than or equal to 7yo IM  3. Influenza vaccination declined Patient declined influenza vaccination at this time. Patient is aware that influenza vaccine prevents illness in 70% of healthy people, and reduces hospitalizations to 30-70% in elderly. This vaccine is recommended annually. Pt is willing to accept risk associated with refusing vaccination.  4. Pneumococcal vaccination declined  5. Class 3 severe obesity due to excess calories without serious comorbidity with body mass index (BMI) of 60.0 to 69.9 in adult [facility])  Chronic Discussed healthy diet and regular exercise options  Encouraged to exercise at least 150 minutes per week with 2 days of strength training - Hemoglobin A1c - Amb Ref to Medical Weight Management  6. Tobacco abuse Comments: She has not taken her wellbutrin  7. Iron deficiency  anemia, unspecified iron deficiency anemia type - CBC - Iron, TIBC and Ferritin Panel  8. Suppurative hidradenitis  9. Decreased breath sounds diminished breath sounds throughout, she is to go for CXR has significant history of asthma and has had respiratory arrest - DG Chest 2 View; Future  She is encouraged to strive for BMI less than 30 to decrease cardiac risk. Advised to aim for at least 150 minutes of exercise per week.  Discussed with her going to a weight management clinic that has a more collaborative team to help with weight loss.   Patient was given opportunity to ask questions. Patient verbalized understanding of the plan and was able to repeat key elements of the plan. All questions were answered to their satisfaction.   [name], FNP   I, [name], FNP, have reviewed all documentation for this visit. The documentation on 08/25/21 for the exam, diagnosis, procedures, and orders are all accurate and complete.   THE PATIENT IS ENCOURAGED TO PRACTICE SOCIAL DISTANCING DUE TO THE COVID-19 PANDEMIC.

## 2021-08-31 ENCOUNTER — Encounter: Payer: Self-pay | Admitting: Nurse Practitioner

## 2021-09-08 IMAGING — CT CT ANGIO CHEST
2 of 6 series · 18 of 36 positions shown · IV contrast (omnipaque)
Comparison: 04/04/2020

CLINICAL DATA: Cardiac and respiratory arrest, history of asthma

EXAM:
CT ANGIOGRAPHY CHEST WITH CONTRAST
TECHNIQUE: Multidetector CT imaging of the chest was performed using the
standard protocol during bolus administration of intravenous
contrast. Multiplanar CT image reconstructions and MIPs were
obtained to evaluate the vascular anatomy.
CONTRAST:  100mL OMNIPAQUE IOHEXOL 350 MG/ML SOLN

[Series 6: pe thins · axial · 0.74mm/px · z∈[-520,-210]mm · 17 of 233 slices shown]
[im 13/233  lung]
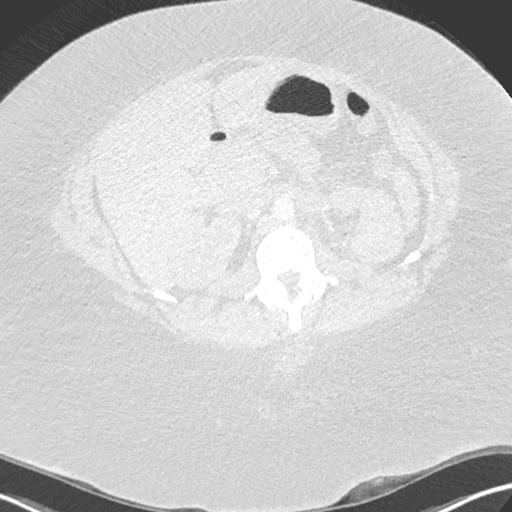
[im 26/233  mediastinal]
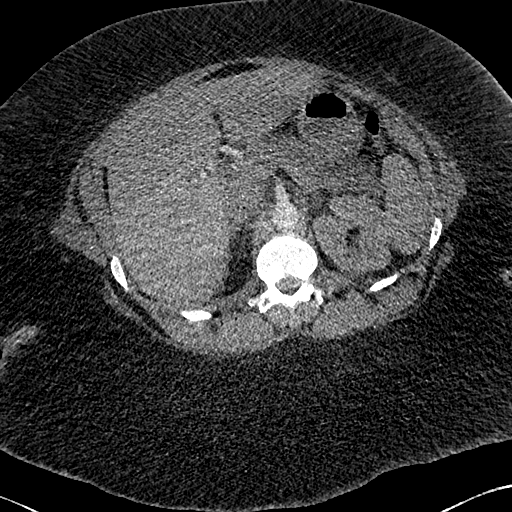
[im 39/233  lung]
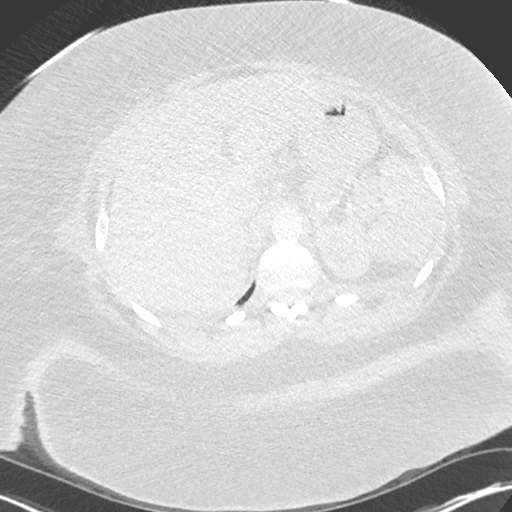
[im 52/233  mediastinal]
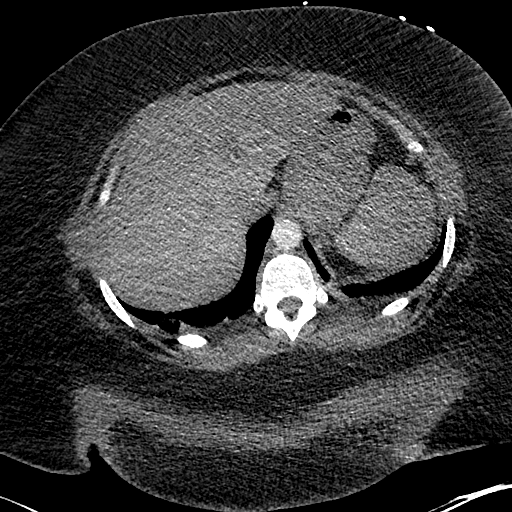
[im 65/233  lung]
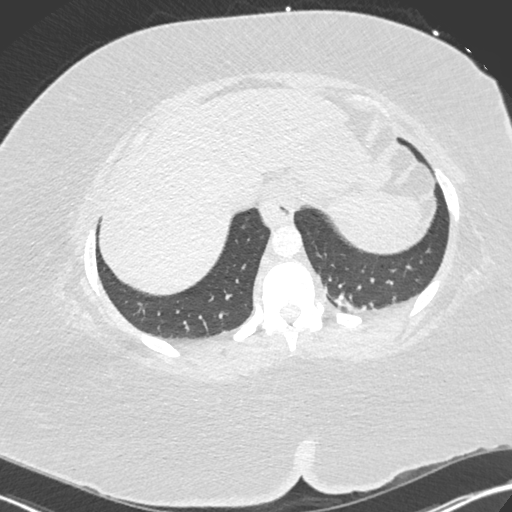
[im 78/233  mediastinal]
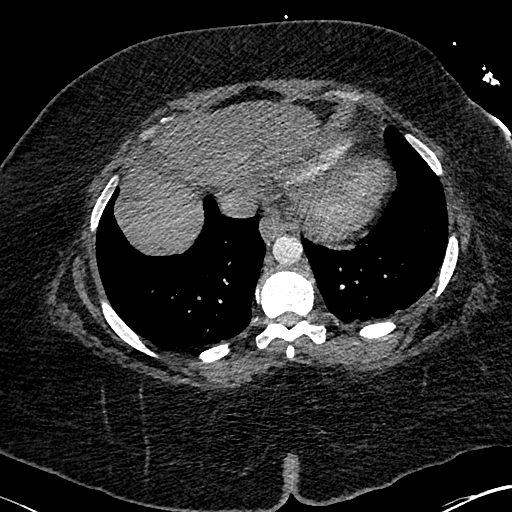
[im 91/233  lung]
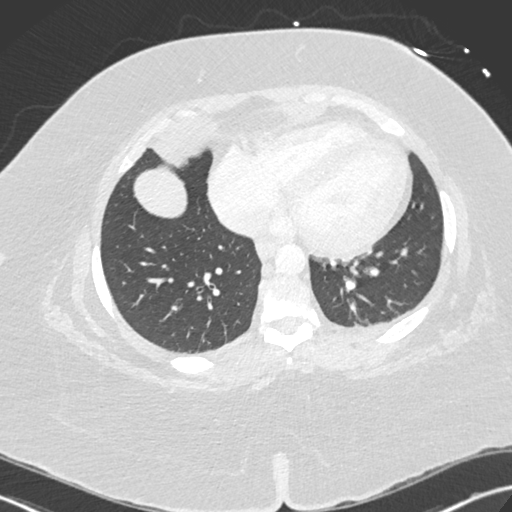
[im 104/233  mediastinal]
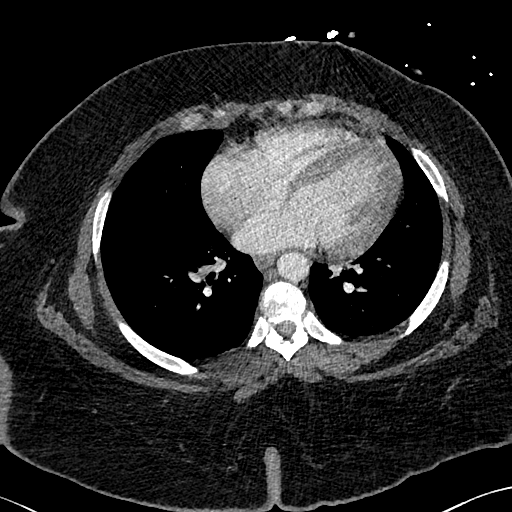
[im 117/233  lung]
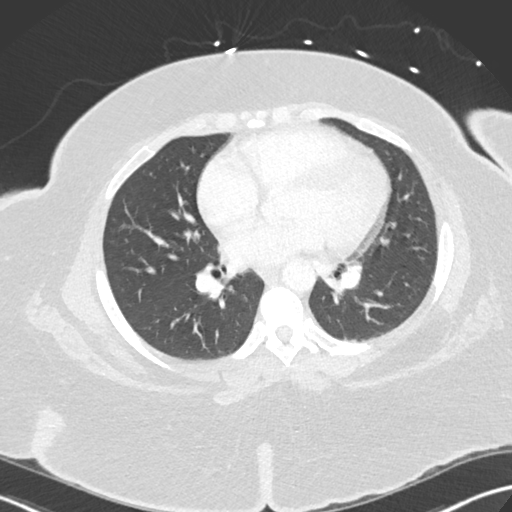
[im 129/233  mediastinal]
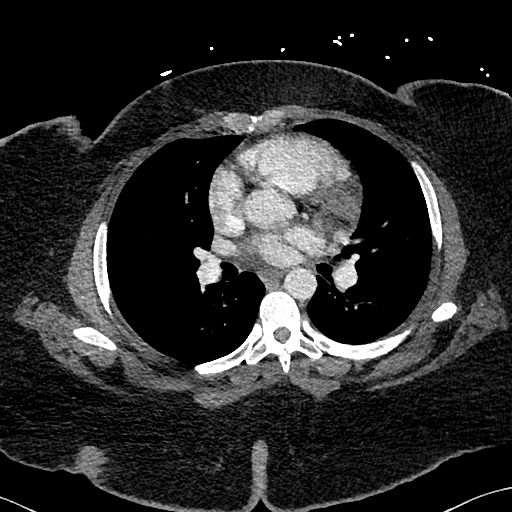
[im 142/233  lung]
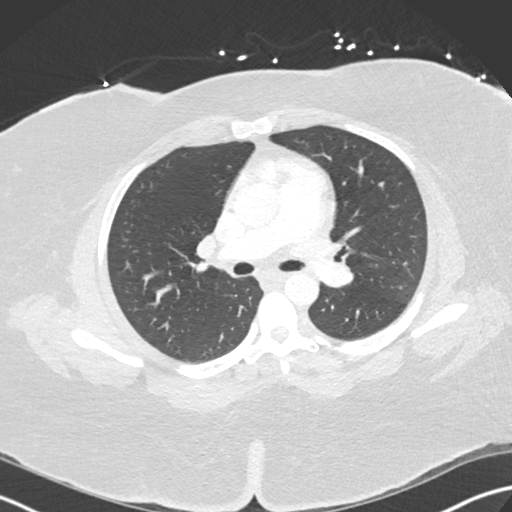
[im 155/233  mediastinal]
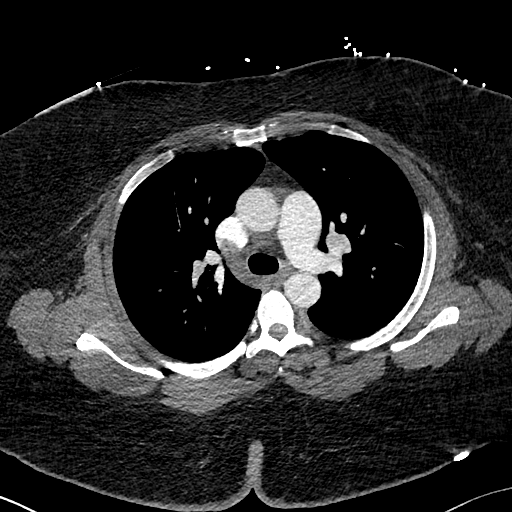
[im 168/233  lung]
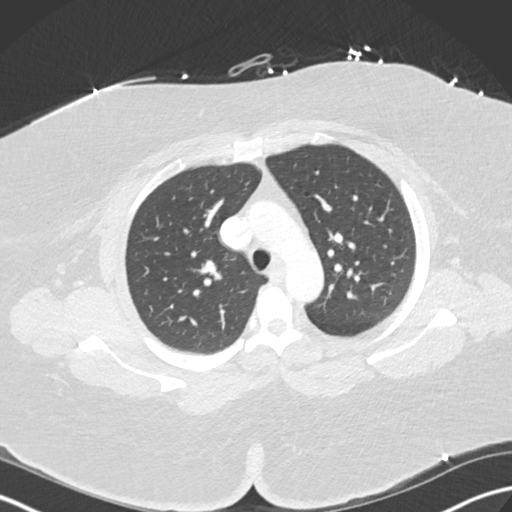
[im 181/233  mediastinal]
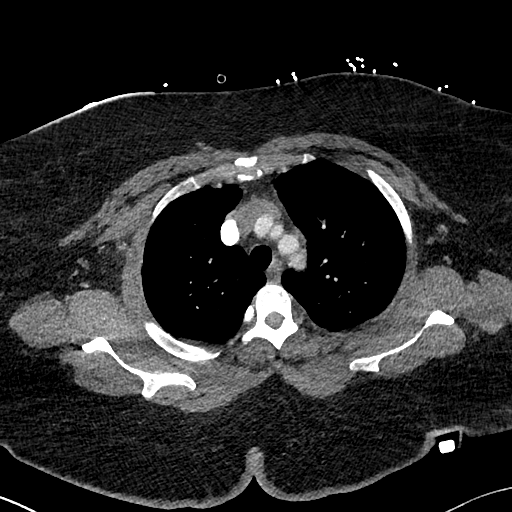
[im 194/233  lung]
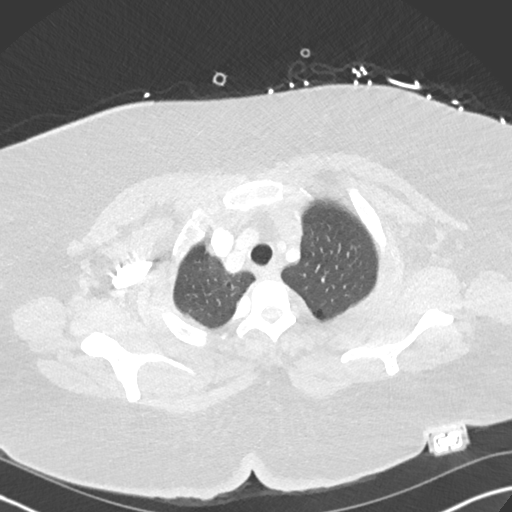
[im 207/233  mediastinal]
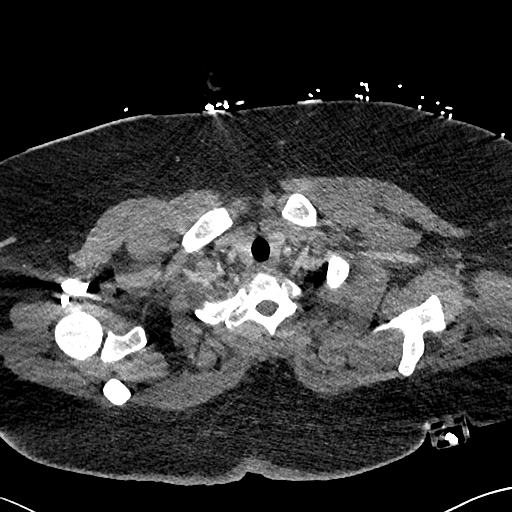
[im 220/233  lung]
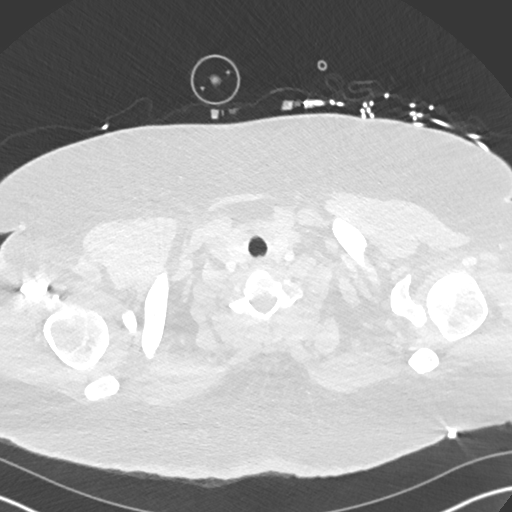

[Series 8: pe 2mm cor · coronal · 0.82mm/px · 1 of 134 slices shown]
[im 67/134  mediastinal]
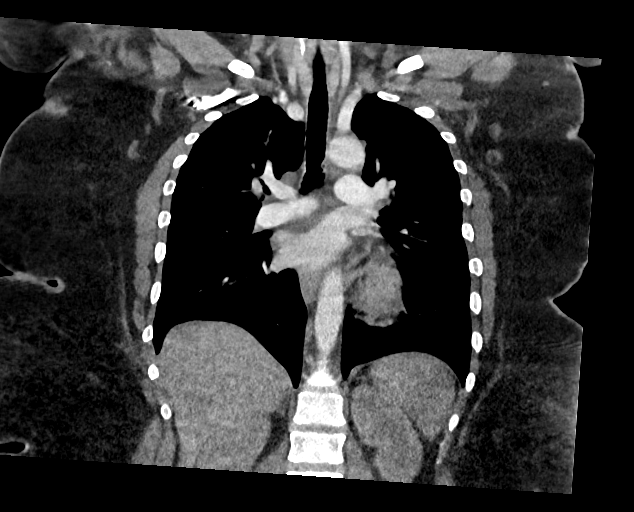

[18 of 36 positions shown; findings below may reference images not displayed]

FINDINGS: Cardiovascular: This is a technically adequate evaluation of the
pulmonary vasculature. No filling defects or pulmonary emboli.

The heart is unremarkable without pericardial effusion. The thoracic
aorta is normal in caliber with no aneurysm or dissection.

Mediastinum/Nodes: No enlarged mediastinal, hilar, or axillary lymph
nodes. Thyroid gland, trachea, and esophagus demonstrate no
significant findings.

Lungs/Pleura: Minimal hypoventilatory changes within the dependent
lower lobes. No airspace disease, effusion, or pneumothorax. Central
airways are patent.

Upper Abdomen: No acute abnormality.

Musculoskeletal: No acute or destructive bony lesions. Reconstructed
images demonstrate no additional findings.

Review of the MIP images confirms the above findings.
IMPRESSION: 1. No evidence of pulmonary embolus.
2. No acute intrathoracic process.

## 2021-09-21 ENCOUNTER — Other Ambulatory Visit: Payer: Self-pay | Admitting: Nurse Practitioner

## 2021-11-30 ENCOUNTER — Other Ambulatory Visit: Payer: Self-pay | Admitting: Nurse Practitioner

## 2022-02-11 DIAGNOSIS — J45901 Unspecified asthma with (acute) exacerbation: Secondary | ICD-10-CM | POA: Diagnosis not present

## 2022-02-11 DIAGNOSIS — R0602 Shortness of breath: Secondary | ICD-10-CM | POA: Diagnosis not present

## 2022-02-24 ENCOUNTER — Ambulatory Visit: Payer: BC Managed Care – PPO | Admitting: Nurse Practitioner

## 2022-04-21 ENCOUNTER — Ambulatory Visit (INDEPENDENT_AMBULATORY_CARE_PROVIDER_SITE_OTHER): Payer: BC Managed Care – PPO | Admitting: Nurse Practitioner

## 2022-04-21 ENCOUNTER — Encounter: Payer: Self-pay | Admitting: Nurse Practitioner

## 2022-04-21 VITALS — BP 124/68 | HR 95 | Temp 98.5°F | Ht 60.0 in | Wt 345.0 lb

## 2022-04-21 DIAGNOSIS — F3289 Other specified depressive episodes: Secondary | ICD-10-CM

## 2022-04-21 DIAGNOSIS — J449 Chronic obstructive pulmonary disease, unspecified: Secondary | ICD-10-CM

## 2022-04-21 DIAGNOSIS — D508 Other iron deficiency anemias: Secondary | ICD-10-CM

## 2022-04-21 DIAGNOSIS — J4540 Moderate persistent asthma, uncomplicated: Secondary | ICD-10-CM

## 2022-04-21 DIAGNOSIS — Z720 Tobacco use: Secondary | ICD-10-CM

## 2022-04-21 DIAGNOSIS — Z6841 Body Mass Index (BMI) 40.0 and over, adult: Secondary | ICD-10-CM | POA: Diagnosis not present

## 2022-04-21 MED ORDER — PHENTERMINE HCL 15 MG PO CAPS: 15.0000 mg | ORAL_CAPSULE | ORAL | 1 refills | Status: DC

## 2022-04-21 MED ORDER — IPRATROPIUM-ALBUTEROL 0.5-2.5 (3) MG/3ML IN SOLN: RESPIRATORY_TRACT | 5 refills | Status: DC

## 2022-04-21 MED ORDER — ALBUTEROL SULFATE (2.5 MG/3ML) 0.083% IN NEBU: 3.0000 mL | INHALATION_SOLUTION | Freq: Four times a day (QID) | RESPIRATORY_TRACT | 1 refills | Status: DC

## 2022-04-21 MED ORDER — MONTELUKAST SODIUM 10 MG PO TABS: 10.0000 mg | ORAL_TABLET | Freq: Every day | ORAL | 1 refills | Status: DC

## 2022-04-21 MED ORDER — BUDESONIDE-FORMOTEROL FUMARATE 160-4.5 MCG/ACT IN AERO: INHALATION_SPRAY | RESPIRATORY_TRACT | 1 refills | Status: DC

## 2022-04-21 MED ORDER — ALBUTEROL SULFATE HFA 108 (90 BASE) MCG/ACT IN AERS: 2.0000 | INHALATION_SPRAY | RESPIRATORY_TRACT | 5 refills | Status: DC | PRN

## 2022-04-21 MED ORDER — BUPROPION HCL ER (XL) 150 MG PO TB24: ORAL_TABLET | ORAL | 1 refills | Status: DC

## 2022-04-21 MED ORDER — NICOTINE 21 MG/24HR TD PT24: 21.0000 mg | MEDICATED_PATCH | TRANSDERMAL | 1 refills | Status: DC
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
## 2022-04-21 NOTE — Patient Instructions (Signed)

## 2022-04-22 LAB — IRON,TIBC AND FERRITIN PANEL
Ferritin: 34 ng/mL (ref 15–150)
Iron Saturation: 16 % (ref 15–55)
Iron: 48 ug/dL (ref 27–159)
Total Iron Binding Capacity: 293 ug/dL (ref 250–450)
UIBC: 245 ug/dL (ref 131–425)

## 2022-04-22 LAB — CMP14+EGFR
ALT: 13 IU/L (ref 0–32)
AST: 13 IU/L (ref 0–40)
Albumin/Globulin Ratio: 1.2 (ref 1.2–2.2)
Albumin: 4 g/dL (ref 3.9–4.9)
Alkaline Phosphatase: 94 IU/L (ref 44–121)
BUN/Creatinine Ratio: 13 (ref 9–23)
BUN: 10 mg/dL (ref 6–24)
Bilirubin Total: 0.7 mg/dL (ref 0.0–1.2)
CO2: 26 mmol/L (ref 20–29)
Calcium: 9.2 mg/dL (ref 8.7–10.2)
Chloride: 101 mmol/L (ref 96–106)
Creatinine, Ser: 0.77 mg/dL (ref 0.57–1.00)
Globulin, Total: 3.3 g/dL (ref 1.5–4.5)
Glucose: 88 mg/dL (ref 70–99)
Potassium: 4.5 mmol/L (ref 3.5–5.2)
Sodium: 137 mmol/L (ref 134–144)
Total Protein: 7.3 g/dL (ref 6.0–8.5)
eGFR: 97 mL/min/{1.73_m2} (ref >59)

## 2022-04-22 LAB — HEMOGLOBIN A1C
Est. average glucose Bld gHb Est-mCnc: 97 mg/dL
Hgb A1c MFr Bld: 5 % (ref 4.8–5.6)

## 2022-05-01 ENCOUNTER — Emergency Department (HOSPITAL_COMMUNITY)
Admission: EM | Admit: 2022-05-01 | Discharge: 2022-05-02 | Disposition: A | Discharge status: Home / Self Care | Payer: BC Managed Care – PPO | Attending: Emergency Medicine | Admitting: Emergency Medicine

## 2022-05-01 ENCOUNTER — Emergency Department (HOSPITAL_COMMUNITY): Payer: BC Managed Care – PPO

## 2022-05-01 ENCOUNTER — Other Ambulatory Visit: Payer: Self-pay

## 2022-05-01 DIAGNOSIS — Z7951 Long term (current) use of inhaled steroids: Secondary | ICD-10-CM | POA: Insufficient documentation

## 2022-05-01 DIAGNOSIS — R000 Tachycardia, unspecified: Secondary | ICD-10-CM | POA: Diagnosis not present

## 2022-05-01 DIAGNOSIS — J45909 Unspecified asthma, uncomplicated: Secondary | ICD-10-CM | POA: Insufficient documentation

## 2022-05-01 DIAGNOSIS — Z720 Tobacco use: Secondary | ICD-10-CM | POA: Insufficient documentation

## 2022-05-01 DIAGNOSIS — K6389 Other specified diseases of intestine: Secondary | ICD-10-CM | POA: Diagnosis not present

## 2022-05-01 DIAGNOSIS — D72829 Elevated white blood cell count, unspecified: Secondary | ICD-10-CM | POA: Insufficient documentation

## 2022-05-01 DIAGNOSIS — N3289 Other specified disorders of bladder: Secondary | ICD-10-CM | POA: Diagnosis not present

## 2022-05-01 DIAGNOSIS — N10 Acute tubulo-interstitial nephritis: Secondary | ICD-10-CM | POA: Diagnosis not present

## 2022-05-01 DIAGNOSIS — N12 Tubulo-interstitial nephritis, not specified as acute or chronic: Secondary | ICD-10-CM | POA: Insufficient documentation

## 2022-05-01 DIAGNOSIS — R509 Fever, unspecified: Secondary | ICD-10-CM

## 2022-05-01 DIAGNOSIS — J209 Acute bronchitis, unspecified: Secondary | ICD-10-CM | POA: Diagnosis not present

## 2022-05-01 DIAGNOSIS — R0600 Dyspnea, unspecified: Secondary | ICD-10-CM | POA: Diagnosis not present

## 2022-05-01 DIAGNOSIS — R7309 Other abnormal glucose: Secondary | ICD-10-CM | POA: Insufficient documentation

## 2022-05-01 DIAGNOSIS — J40 Bronchitis, not specified as acute or chronic: Secondary | ICD-10-CM

## 2022-05-01 DIAGNOSIS — Z20822 Contact with and (suspected) exposure to covid-19: Secondary | ICD-10-CM | POA: Insufficient documentation

## 2022-05-01 DIAGNOSIS — I10 Essential (primary) hypertension: Secondary | ICD-10-CM | POA: Diagnosis not present

## 2022-05-01 DIAGNOSIS — K760 Fatty (change of) liver, not elsewhere classified: Secondary | ICD-10-CM | POA: Diagnosis not present

## 2022-05-01 DIAGNOSIS — R0902 Hypoxemia: Secondary | ICD-10-CM | POA: Diagnosis not present

## 2022-05-01 LAB — COMPREHENSIVE METABOLIC PANEL
ALT: 25 U/L (ref 0–44)
AST: 30 U/L (ref 15–41)
Albumin: 3.8 g/dL (ref 3.5–5.0)
Alkaline Phosphatase: 88 U/L (ref 38–126)
Anion gap: 7 (ref 5–15)
BUN: 14 mg/dL (ref 6–20)
CO2: 26 mmol/L (ref 22–32)
Calcium: 8.9 mg/dL (ref 8.9–10.3)
Chloride: 105 mmol/L (ref 98–111)
Creatinine, Ser: 0.87 mg/dL (ref 0.44–1.00)
GFR, Estimated: >60 mL/min (ref >60)
Glucose, Bld: 144 mg/dL — ABNORMAL HIGH (ref 70–99)
Potassium: 3.7 mmol/L (ref 3.5–5.1)
Sodium: 138 mmol/L (ref 135–145)
Total Bilirubin: 0.8 mg/dL (ref 0.3–1.2)
Total Protein: 7.7 g/dL (ref 6.5–8.1)

## 2022-05-01 LAB — CBC WITH DIFFERENTIAL/PLATELET
Abs Immature Granulocytes: 0.07 10*3/uL (ref 0.00–0.07)
Basophils Absolute: 0.1 10*3/uL (ref 0.0–0.1)
Basophils Relative: 0 %
Eosinophils Absolute: 0.2 10*3/uL (ref 0.0–0.5)
Eosinophils Relative: 2 %
HCT: 46.8 % — ABNORMAL HIGH (ref 36.0–46.0)
Hemoglobin: 14.8 g/dL (ref 12.0–15.0)
Immature Granulocytes: 1 %
Lymphocytes Relative: 4 %
Lymphs Abs: 0.5 10*3/uL — ABNORMAL LOW (ref 0.7–4.0)
MCH: 29.7 pg (ref 26.0–34.0)
MCHC: 31.6 g/dL (ref 30.0–36.0)
MCV: 94 fL (ref 80.0–100.0)
Monocytes Absolute: 1 10*3/uL (ref 0.1–1.0)
Monocytes Relative: 8 %
Neutro Abs: 10.7 10*3/uL — ABNORMAL HIGH (ref 1.7–7.7)
Neutrophils Relative %: 85 %
Platelets: 171 10*3/uL (ref 150–400)
RBC: 4.98 MIL/uL (ref 3.87–5.11)
RDW: 13.7 % (ref 11.5–15.5)
WBC: 12.5 10*3/uL — ABNORMAL HIGH (ref 4.0–10.5)
nRBC: 0 % (ref 0.0–0.2)

## 2022-05-01 LAB — URINALYSIS, ROUTINE W REFLEX MICROSCOPIC
Bacteria, UA: NONE SEEN
Bilirubin Urine: NEGATIVE
Glucose, UA: NEGATIVE mg/dL
Ketones, ur: NEGATIVE mg/dL
Leukocytes,Ua: NEGATIVE
Nitrite: NEGATIVE
Protein, ur: NEGATIVE mg/dL
Specific Gravity, Urine: 1.01 (ref 1.005–1.030)
pH: 5 (ref 5.0–8.0)

## 2022-05-01 LAB — I-STAT BETA HCG BLOOD, ED (MC, WL, AP ONLY): I-stat hCG, quantitative: <5 m[IU]/mL (ref <5)

## 2022-05-01 LAB — LACTIC ACID, PLASMA
Lactic Acid, Venous: 2.1 mmol/L (ref 0.5–1.9)
Lactic Acid, Venous: 1.4 mmol/L (ref 0.5–1.9)

## 2022-05-01 LAB — CK: Total CK: 93 U/L (ref 38–234)

## 2022-05-01 LAB — BRAIN NATRIURETIC PEPTIDE: B Natriuretic Peptide: 58.8 pg/mL (ref 0.0–100.0)

## 2022-05-01 LAB — SARS CORONAVIRUS 2 BY RT PCR: SARS Coronavirus 2 by RT PCR: NEGATIVE

## 2022-05-01 LAB — TROPONIN I (HIGH SENSITIVITY): Troponin I (High Sensitivity): 15 ng/L (ref <18)

## 2022-05-01 MED ORDER — IBUPROFEN 800 MG PO TABS
800.0000 mg | ORAL_TABLET | Freq: Once | ORAL | Status: AC
  Administered 2022-05-01: 800 mg via ORAL
  Filled 2022-05-01: qty 1

## 2022-05-01 MED ORDER — SODIUM CHLORIDE 0.9 % IV SOLN
2.0000 g | Freq: Once | INTRAVENOUS | Status: AC
  Administered 2022-05-01: 2 g via INTRAVENOUS
  Filled 2022-05-01: qty 20

## 2022-05-01 MED ORDER — LACTATED RINGERS IV BOLUS
1000.0000 mL | Freq: Once | INTRAVENOUS | Status: AC
  Administered 2022-05-01: 1000 mL via INTRAVENOUS

## 2022-05-01 MED ORDER — SODIUM CHLORIDE (PF) 0.9 % IJ SOLN
INTRAMUSCULAR | Status: AC
  Filled 2022-05-01: qty 50

## 2022-05-01 MED ORDER — IOHEXOL 350 MG/ML SOLN
100.0000 mL | Freq: Once | INTRAVENOUS | Status: AC | PRN
  Administered 2022-05-01: 100 mL via INTRAVENOUS

## 2022-05-01 MED ORDER — LACTATED RINGERS BOLUS PEDS
1000.0000 mL | Freq: Once | INTRAVENOUS | Status: AC
  Administered 2022-05-01: 1000 mL via INTRAVENOUS

## 2022-05-01 MED ORDER — ACETAMINOPHEN 325 MG PO TABS
325.0000 mg | ORAL_TABLET | Freq: Once | ORAL | Status: AC
Start: 2022-05-01 — End: 2022-05-01
  Administered 2022-05-01: 325 mg via ORAL
  Filled 2022-05-01: qty 1

## 2022-05-01 MED ORDER — ONDANSETRON HCL 4 MG PO TABS: 4.0000 mg | ORAL_TABLET | Freq: Four times a day (QID) | ORAL | 0 refills | Status: DC

## 2022-05-01 MED ORDER — CEFDINIR 300 MG PO CAPS: 300.0000 mg | ORAL_CAPSULE | Freq: Two times a day (BID) | ORAL | 0 refills | Status: AC

## 2022-05-01 MED ORDER — ACETAMINOPHEN 500 MG PO TABS
1000.0000 mg | ORAL_TABLET | Freq: Once | ORAL | Status: DC
  Filled 2022-05-01: qty 2

## 2022-05-01 MED ORDER — ACETAMINOPHEN 325 MG PO TABS: 650.0000 mg | ORAL_TABLET | Freq: Once | ORAL | Status: DC | PRN

## 2022-05-01 NOTE — ED Provider Notes (Signed)
[facility] DEPT Provider Note   CSN: [id number] Arrival date & time: 05/01/22  1902     History  Chief Complaint  Patient presents with   Fever    April Ellis is a 46 y.o. female.  With PMH of obesity, asthma, tobacco use, OSA, thrombocytosis who presents with fever, chills and dry cough.  Patient says she had been watching a Netflix movie earlier today when she woke up and had chills.  She continued to feel chills but was unable to take a temperature because her thermometer was not working anymore.  She called EMS due to persistent chills however after getting ibuprofen and Tylenol she is no longer having chills and feels much better.  She has been tolerating p.o. with no vomiting or diarrhea, no abdominal pain.  She does note peeing more than usual but she has been on a course of steroids.  No history of diabetes.  She has chronic leg swelling but no new pain or increased in swelling.  She noted having some darker urine but also decreased p.o. intake as well as polyuria for the past couple of days which she attributes to taking steroids.  She also notes some dry cough.  No headache or sore throat.  No history of PE or DVT.  She does note some left-sided chest tightness worse with deep inspiration and after coughing. No shortness of breath. Denies any cuts or skin changes.   Fever      Home Medications Prior to Admission medications   Medication Sig Start Date End Date Taking? Authorizing Provider  albuterol (VENTOLIN HFA) 108 (90 Base) MCG/ACT inhaler Inhale 2 puffs into the lungs every 4 (four) hours as needed for wheezing or shortness of breath. 04/21/22   [name], FNP  budesonide (PULMICORT) 1 MG/2ML nebulizer solution TAKE 2 MLS (1 MG TOTAL) BY NEBULIZATION 4 (FOUR) TIMES DAILY AS NEEDED. 09/22/21   [name], FNP  budesonide-formoterol [facility]) 160-4.5 MCG/ACT inhaler Take 2 puffs twice daily 04/21/22   [name], FNP  buPROPion  (WELLBUTRIN XL) 150 MG 24 hr tablet TAKE 1 TABLET BY MOUTH EVERY DAY IN THE MORNING 04/21/22   [name], FNP  doxycycline (MONODOX) 100 MG capsule Take 1 capsule (100 mg total) by mouth 2 (two) times daily. 08/25/21   [name], FNP  EPINEPHrine 0.3 mg/0.3 mL IJ SOAJ injection Inject 0.3 mg into the muscle as needed for anaphylaxis. 08/25/21   [name], FNP  fluticasone (FLONASE SENSIMIST) 27.5 MCG/SPRAY nasal spray Place 2 sprays into the nose daily. 07/21/21   [name], FNP  ipratropium-albuterol (DUONEB) 0.5-2.5 (3) MG/3ML SOLN INHALE 1 VIAL BY MOUTH VIA NEBULIZER EVERY 6 HOURS AS NEEDED 04/21/22   [name], FNP  montelukast (SINGULAIR) 10 MG tablet Take 1 tablet (10 mg total) by mouth at bedtime. TAKE 1 TABLET BY MOUTH EVERYDAY AT BEDTIME Strength: 10 mg 04/21/22   [name], FNP  nicotine (NICODERM CQ - DOSED IN MG/24 HOURS) 21 mg/24hr patch Place 1 patch (21 mg total) onto the skin daily. 04/21/22 04/21/23  [name], FNP  phentermine 15 MG capsule Take 1 capsule (15 mg total) by mouth every morning. 04/21/22   [name], FNP      Allergies    Penicillins    Review of Systems   Review of Systems  Constitutional:  Positive for fever.    Physical Exam Updated Vital Signs BP 126/74 (BP Location: Right Arm)   Pulse (!) 107  Temp 98.1 F (36.7 C) (Oral)   Resp 18   Ht 5' (1.524 m)   Wt (!) 156.5 kg   SpO2 94%   BMI 67.38 kg/m  Physical Exam Constitutional: Alert and oriented. Well appearing and in no distress.  Nontoxic in appearance. Eyes: Conjunctivae are normal. ENT      Head: Normocephalic and atraumatic.      Nose: No congestion.      Mouth/Throat: Mucous membranes are moist.      Neck: No stridor. Cardiovascular: S1, S2, tachycardic, regular rhythm.Warm and well perfused. Respiratory: Normal respiratory effort. Breath sounds are normal. Gastrointestinal: Soft and nontender. There is no CVA tenderness. Musculoskeletal: Normal range of  motion in all extremities. Equal nontender pitting edema of the lower extremities extending to the mid calf. Neurologic: Normal speech and language. No gross focal neurologic deficits are appreciated. Skin: Skin is warm, dry and intact. No rash noted. Psychiatric: Mood and affect are normal. Speech and behavior are normal.   ED Results / Procedures / Treatments   Labs (all labs ordered are listed, but only abnormal results are displayed) Labs Reviewed  LACTIC ACID, PLASMA - Abnormal; Notable for the following components:      Result Value   Lactic Acid, Venous 2.1 (*)    All other components within normal limits  COMPREHENSIVE METABOLIC PANEL - Abnormal; Notable for the following components:   Glucose, Bld 144 (*)    All other components within normal limits  CBC WITH DIFFERENTIAL/PLATELET - Abnormal; Notable for the following components:   WBC 12.5 (*)    HCT 46.8 (*)    Neutro Abs 10.7 (*)    Lymphs Abs 0.5 (*)    All other components within normal limits  URINALYSIS, ROUTINE W REFLEX MICROSCOPIC - Abnormal; Notable for the following components:   Color, Urine STRAW (*)    Hgb urine dipstick SMALL (*)    All other components within normal limits  SARS CORONAVIRUS 2 BY RT PCR  CULTURE, BLOOD (ROUTINE X 2)  CULTURE, BLOOD (ROUTINE X 2)  URINE CULTURE  LACTIC ACID, PLASMA  CK  BRAIN NATRIURETIC PEPTIDE  I-STAT BETA HCG BLOOD, ED ([facility], [facility], [facility] ONLY)  TROPONIN I (HIGH SENSITIVITY)    EKG None  Radiology CT ABDOMEN PELVIS W CONTRAST  Result Date: 05/01/2022 CLINICAL DATA:  evaluate pyelo Dark urine.  Urinary frequency and urgency.  Fever. EXAM: CT ABDOMEN AND PELVIS WITH CONTRAST TECHNIQUE: Multidetector CT imaging of the abdomen and pelvis was performed using the standard protocol following bolus administration of intravenous contrast. RADIATION DOSE REDUCTION: This exam was performed according to the departmental dose-optimization program which includes automated exposure  control, adjustment of the mA and/or kV according to patient size and/or use of iterative reconstruction technique. CONTRAST:  152m OMNIPAQUE IOHEXOL 350 MG/ML SOLN COMPARISON:  None Available. FINDINGS: Lower chest: Assessed on concurrent chest CT, reported separately. Hepatobiliary: Enlarged liver spanning 23.6 cm cranial caudal with mild subjective steatosis. No focal liver lesion. Gallbladder physiologically distended, no calcified stone. No biliary dilatation. Pancreas: No ductal dilatation or inflammation. Spleen: Normal in size without focal abnormality. Adrenals/Urinary Tract: No adrenal nodule. Slight heterogeneous left renal enhancement. No focal fluid collection or focal renal lesion. No hydronephrosis. No renal calculi. Urinary bladder is minimally distended, no obvious bladder wall thickening. Stomach/Bowel: Detailed bowel assessment is limited in the absence of enteric contrast. Tiny hiatal hernia. No small bowel obstruction or inflammation. Normal appendix. Moderate diffuse colonic stool burden. No colonic inflammatory change. Vascular/Lymphatic:  No significant vascular findings are present. No enlarged abdominal or pelvic lymph nodes. Reproductive: Uterus and bilateral adnexa are unremarkable. Other: No free air or ascites. Small fat containing umbilical hernia. Musculoskeletal: Mild lumbar degenerative change. There are no acute or suspicious osseous abnormalities. IMPRESSION: 1. Slight heterogeneous left renal enhancement, suspicious for pyelonephritis. No renal abscess. 2. Hepatomegaly and hepatic steatosis. 3. Small fat containing umbilical hernia. Electronically Signed   By: Keith Rake M.D.   On: 05/01/2022 22:03   CT Angio Chest PE W and/or Wo Contrast  Result Date: 05/01/2022 CLINICAL DATA:  tachycardic, dyspnea, pleuritic EXAM: CT ANGIOGRAPHY CHEST WITH CONTRAST TECHNIQUE: Multidetector CT imaging of the chest was performed using the standard protocol during bolus administration of  intravenous contrast. Multiplanar CT image reconstructions and MIPs were obtained to evaluate the vascular anatomy. RADIATION DOSE REDUCTION: This exam was performed according to the departmental dose-optimization program which includes automated exposure control, adjustment of the mA and/or kV according to patient size and/or use of iterative reconstruction technique. CONTRAST:  127m OMNIPAQUE IOHEXOL 350 MG/ML SOLN COMPARISON:  Radiograph earlier today.  Chest CTA 04/04/2020 FINDINGS: Cardiovascular: There are no filling defects within the pulmonary arteries to suggest pulmonary embolus. Subsegmental branches are not well assessed due to contrast bolus timing and soft tissue attenuation from habitus. Dilated main pulmonary artery at 3.8 cm. Thoracic aorta is normal in caliber. Borderline cardiomegaly. No pericardial effusion. Mediastinum/Nodes: No mediastinal adenopathy or mass. No visible thyroid nodule. Tiny hiatal hernia, otherwise unremarkable esophagus. No visible thyroid nodule. Lungs/Pleura: Loud from mild breathing motion artifact, no confluent consolidation. Minor atelectasis in the dependent lower lobes. No pleural fluid or features of pulmonary edema. There is mild central bronchial thickening. Upper Abdomen: Assessed on concurrent abdominal CT, reported separately. Musculoskeletal: There are no acute or suspicious osseous abnormalities. Mild lower thoracic spondylosis. No chest wall soft tissue abnormalities. Review of the MIP images confirms the above findings. IMPRESSION: 1. No pulmonary embolus. 2. Dilated main pulmonary artery, suggestive of pulmonary arterial hypertension. 3. Mild central bronchial thickening, can be seen with bronchitis or reactive airways disease. Electronically Signed   By: MKeith RakeM.D.   On: 05/01/2022 21:59   DG Chest 2 View  Result Date: 05/01/2022 CLINICAL DATA:  Fever, increased urinary frequency EXAM: CHEST - 2 VIEW COMPARISON:  04/04/2020 FINDINGS: The heart  size and mediastinal contours are within normal limits. Both lungs are clear. The visualized skeletal structures are unremarkable. IMPRESSION: No active cardiopulmonary disease. Electronically Signed   By: MRanda NgoM.D.   On: 05/01/2022 19:52    Procedures Procedures  Remain on constant cardiac and pulse ox monitoring, sinus tachycardia.  Medications Ordered in ED Medications  sodium chloride (PF) 0.9 % injection (  Not Given 05/01/22 2134)  lactated ringers bolus PEDS (1,000 mLs Intravenous New Bag/Given 05/01/22 2238)  ibuprofen (ADVIL) tablet 800 mg (800 mg Oral Given 05/01/22 1959)  lactated ringers bolus 1,000 mL (1,000 mLs Intravenous New Bag/Given 05/01/22 2055)  iohexol (OMNIPAQUE) 350 MG/ML injection 100 mL (100 mLs Intravenous Contrast Given 05/01/22 2136)  acetaminophen (TYLENOL) tablet 325 mg (325 mg Oral Given 05/01/22 2139)  cefTRIAXone (ROCEPHIN) 2 g in sodium chloride 0.9 % 100 mL IVPB (2 g Intravenous New Bag/Given 05/01/22 2239)    ED Course/ Medical Decision Making/ A&P Clinical Course as of 05/01/22 2334  Sat May 01, 2022  2213 Patient is resting comfortably in bed.  Heart rate has improved to 115.  Her UA had small hemoglobin but  no RBCs or WBCs however she did have a leukocytosis 12.5 with left shift.  On CT scan, findings suggestive of possible pyelonephritis of the left kidney and with her leukocytosis and fever and polyuria, will cover for possible pyelonephritis with IV Rocephin.  CTA suggestive of bronchitis and some pulmonary hypertension but no hypoxia on exam and no evidence of PE.  No pneumonia.  Glucose slightly elevated 144 but no other acute electrolyte abnormalities.  Lactate down trended to 1.4.  BMP unremarkable.  CK unremarkable, no rhabdomyolysis.  Plan to give patient second dose of IV fluids and dose of IV Rocephin for pyelonephritis.  If she is still well-appearing and tolerating p.o. and feeling well, plan to discharge with 10 days cefdinir and strict return  precautions.  Patient is in agreement with this plan. [VB]  2332 Patient feeling much better and feels safe to go home and prefers to go home. Her trop was 15 and reassuring with no ischemic changes on EKG and no active CP and negative PE study. HR improved to 107. She is tolerating po. Blood cultures obtained and patient will be called back if positive. Will discharge with 10 days cefdinir 300 mg bid and zofran prn and strict return precautions discussed. Will refer to cardiology for findings on CT c/f pulmonary htn. She is safe for discharge home. [VB]    Clinical Course User Index [VB] Elgie Congo, MD                           Medical Decision Making April Ellis is a 46 y.o. female.  With PMH of obesity, asthma, tobacco use, OSA, thrombocytosis who presents with fever, chills and dry cough.  Patient presented with fever on arrival temperature 37.9 C and heart rate 137 with chills, polyuria and dry cough.  Clinically, she is very well-appearing, nontoxic in no acute distress.  She has complaints of polyuria but UA obtained with no signs of UTI, hemoglobinuria present but normal CK not consistent with rhabdomyolysis.  Chest x-ray obtained upon arrival with no evidence of consolidation concerning for pneumonia.  Suspect she is likely having symptoms secondary to a viral syndrome such as COVID or influenza among multiple other etiologies or possible pyelonephritis despite unremarkable urine.  However, with her complaints of pleuritic chest pain with cough which I suspect is likely pleuritis and pleurisy from suspected viral syndrome, with her tachycardia, she is not PERC negative so will obtain CTA to rule out PE and CT abdomen pelvis with contrast to further evaluate for possible pyelonephritis with urinary symptoms despite negative UA.  Sepsis labs and work-up have been sent.  Holding off from broad-spectrum antibiotics at this time as there is no obvious bacterial source for infection and  patient overall well-appearing and nontoxic.  Initial lactate slightly elevated 2.1 which downtrended 1.4.  Patient will receive IV fluids, ibuprofen and was given Tylenol with EMS.  If work-up unremarkable and no obvious bacterial source or clinically feeling well with improved VS, likely discharge with strict return precautions.  Amount and/or Complexity of Data Reviewed Labs: ordered. Decision-making details documented in ED Course. Radiology: ordered and independent interpretation performed. Decision-making details documented in ED Course.    Details: Chest x-ray with no consolidation concerning for pneumonia, no pulmonary edema, no pneumothorax ECG/medicine tests: independent interpretation performed. Decision-making details documented in ED Course.  Risk OTC drugs. Prescription drug management.    Final Clinical Impression(s) / ED Diagnoses Final diagnoses:  Fever, unspecified fever cause  Bronchitis  Pyelonephritis    Rx / DC Orders ED Discharge Orders     None         Elgie Congo, MD 05/01/22 [phone]

## 2022-05-01 NOTE — ED Provider Triage Note (Signed)
Emergency Medicine Provider Triage Evaluation Note  April Ellis , a 46 y.o. female  was evaluated in triage.  Pt complains of dark urine for two days.  She reports no dysuria, does endorse frequency and urgency.  Fever started today.   Her legs have been swelling for three days.    She was given tylenol with EMS and ibuprofen in triage.   Occasional cough, slight headache, no body aches.   Denies tick bites    Physical Exam  BP 138/63 (BP Location: Left Arm)   Pulse (!) 137   Temp 100.3 F (37.9 C) (Oral)   Resp 18   Ht 5' (1.524 m)   Wt (!) 156.5 kg   SpO2 95%   BMI 67.38 kg/m  Gen:   Awake, no distress   Resp:  Normal effort  MSK:   Moves extremities without difficulty  Other:  Edema BLE.   Medical Decision Making  Medically screening exam initiated at 8:20 PM.  Appropriate orders placed.  NAIDELYN PARRELLA was informed that the remainder of the evaluation will be completed by another provider, this initial triage assessment does not replace that evaluation, and the importance of remaining in the ED until their evaluation is complete.     Lorin Glass, [place] 05/01/22 2024

## 2022-05-01 NOTE — ED Triage Notes (Signed)
Via EMS from home c/o increased urinary frequency x 2 days with urine dark in color. Bilateral lower leg swelling with no hx CHF. +Chills and fever with temp 101.4 en route treated with '650mg'$  tylenol prior to arrival. T 100.3 in triage. Pt denies pain

## 2022-05-01 NOTE — Discharge Instructions (Signed)
You have been seen in the Emergency Department (ED) today and your workup today suggests that you have a a kidney infection  You have been prescribed an antibiotic called cefdinir. Take this medication as prescribed and do not miss any doses. You may use over-the-counter pain medication Tylenol 1g every 6-8 hours and ibuprofen 600 mg every 4-6 hours as needed for pain and fever.  Drink PLENTY of fluids.  Call your regular doctor to schedule the next available appointment (ideally within one week) to follow up on today's ED visit. Return immediately to the ED if your pain worsens, you develop back pain or you have decreased urine production, develop persistent fever >100.4, persistent vomiting such that you can't hold down your antibiotics, or other symptoms that concern you.  We have also referred you to cardiology for your chronic shortness of breath. If you have not heard from the Cardiology office within the next 72 hours please call [phone].

## 2022-05-04 DIAGNOSIS — I10 Essential (primary) hypertension: Secondary | ICD-10-CM | POA: Diagnosis not present

## 2022-05-04 DIAGNOSIS — R062 Wheezing: Secondary | ICD-10-CM | POA: Diagnosis not present

## 2022-05-04 DIAGNOSIS — R0902 Hypoxemia: Secondary | ICD-10-CM | POA: Diagnosis not present

## 2022-05-04 LAB — URINE CULTURE: Culture: 40000 — AB

## 2022-05-05 ENCOUNTER — Telehealth: Payer: Self-pay | Admitting: Emergency Medicine

## 2022-05-05 NOTE — Telephone Encounter (Signed)
Post ED Visit - Positive Culture Follow-up  Culture report reviewed by antimicrobial stewardship pharmacist: [facility] Team '[]'$  Elenor Quinones, Pharm.D. '[]'$  Heide Guile, Pharm.D., BCPS AQ-ID '[]'$  Parks Neptune, Pharm.D., BCPS '[]'$  Alycia Rossetti, Pharm.D., BCPS '[]'$  Kimberly, Pharm.D., BCPS, AAHIVP '[]'$  Legrand Como, Pharm.D., BCPS, AAHIVP '[]'$  Salome Arnt, PharmD, BCPS '[]'$  Johnnette Gourd, PharmD, BCPS '[]'$  Hughes Better, PharmD, BCPS '[]'$  Leeroy Cha, PharmD '[]'$  Laqueta Linden, PharmD, BCPS '[]'$  Albertina Parr, PharmD  [place] Team '[]'$  Leodis Sias, PharmD '[]'$  Lindell Spar, PharmD '[]'$  Royetta Asal, PharmD '[]'$  Graylin Shiver, Rph '[]'$  Rema Fendt) Glennon Mac, PharmD '[]'$  Arlyn Dunning, PharmD '[]'$  Netta Cedars, PharmD '[]'$  Dia Sitter, PharmD '[]'$  Leone Haven, PharmD '[]'$  Gretta Arab, PharmD '[]'$  Theodis Shove, PharmD '[]'$  Peggyann Juba, PharmD '[]'$  Reuel Boom, PharmD Jimmy Footman PharmD   Positive urine culture Treated with cefdinir, organism sensitive to the same and no further patient follow-up is required at this time.  Hazle Nordmann 05/05/2022, 12:10 PM

## 2022-05-06 LAB — CULTURE, BLOOD (ROUTINE X 2)
Culture: NO GROWTH
Culture: NO GROWTH

## 2022-05-07 ENCOUNTER — Ambulatory Visit: Payer: BC Managed Care – PPO | Admitting: Adult Health

## 2022-05-18 ENCOUNTER — Ambulatory Visit: Payer: BC Managed Care – PPO | Admitting: Nurse Practitioner

## 2022-05-19 ENCOUNTER — Ambulatory Visit: Payer: BC Managed Care – PPO | Admitting: Adult Health

## 2022-05-27 ENCOUNTER — Ambulatory Visit: Payer: BC Managed Care – PPO | Admitting: Nurse Practitioner

## 2022-06-08 ENCOUNTER — Other Ambulatory Visit: Payer: Self-pay

## 2022-06-08 ENCOUNTER — Emergency Department (HOSPITAL_COMMUNITY): Payer: BC Managed Care – PPO

## 2022-06-08 ENCOUNTER — Inpatient Hospital Stay (HOSPITAL_COMMUNITY)
Admission: EM | Admit: 2022-06-08 | Discharge: 2022-06-12 | DRG: 286 | Disposition: A | Discharge status: Home / Self Care | Payer: BC Managed Care – PPO | Attending: Internal Medicine | Admitting: Internal Medicine

## 2022-06-08 ENCOUNTER — Encounter (HOSPITAL_COMMUNITY): Payer: Self-pay

## 2022-06-08 DIAGNOSIS — Z79899 Other long term (current) drug therapy: Secondary | ICD-10-CM

## 2022-06-08 DIAGNOSIS — I5041 Acute combined systolic (congestive) and diastolic (congestive) heart failure: Secondary | ICD-10-CM | POA: Diagnosis not present

## 2022-06-08 DIAGNOSIS — Z6841 Body Mass Index (BMI) 40.0 and over, adult: Secondary | ICD-10-CM

## 2022-06-08 DIAGNOSIS — R0602 Shortness of breath: Secondary | ICD-10-CM | POA: Diagnosis not present

## 2022-06-08 DIAGNOSIS — Z720 Tobacco use: Secondary | ICD-10-CM | POA: Diagnosis present

## 2022-06-08 DIAGNOSIS — F32A Depression, unspecified: Secondary | ICD-10-CM | POA: Diagnosis not present

## 2022-06-08 DIAGNOSIS — J4489 Other specified chronic obstructive pulmonary disease: Secondary | ICD-10-CM | POA: Diagnosis present

## 2022-06-08 DIAGNOSIS — J9601 Acute respiratory failure with hypoxia: Secondary | ICD-10-CM | POA: Diagnosis present

## 2022-06-08 DIAGNOSIS — D751 Secondary polycythemia: Secondary | ICD-10-CM | POA: Diagnosis not present

## 2022-06-08 DIAGNOSIS — I272 Pulmonary hypertension, unspecified: Secondary | ICD-10-CM | POA: Diagnosis not present

## 2022-06-08 DIAGNOSIS — J45901 Unspecified asthma with (acute) exacerbation: Secondary | ICD-10-CM | POA: Diagnosis not present

## 2022-06-08 DIAGNOSIS — Z8249 Family history of ischemic heart disease and other diseases of the circulatory system: Secondary | ICD-10-CM

## 2022-06-08 DIAGNOSIS — Z880 Allergy status to penicillin: Secondary | ICD-10-CM

## 2022-06-08 DIAGNOSIS — Z20822 Contact with and (suspected) exposure to covid-19: Secondary | ICD-10-CM | POA: Diagnosis not present

## 2022-06-08 DIAGNOSIS — R000 Tachycardia, unspecified: Secondary | ICD-10-CM | POA: Diagnosis not present

## 2022-06-08 DIAGNOSIS — M7989 Other specified soft tissue disorders: Secondary | ICD-10-CM

## 2022-06-08 DIAGNOSIS — Z7951 Long term (current) use of inhaled steroids: Secondary | ICD-10-CM

## 2022-06-08 DIAGNOSIS — J449 Chronic obstructive pulmonary disease, unspecified: Secondary | ICD-10-CM | POA: Diagnosis not present

## 2022-06-08 DIAGNOSIS — I420 Dilated cardiomyopathy: Secondary | ICD-10-CM | POA: Diagnosis not present

## 2022-06-08 DIAGNOSIS — Z91018 Allergy to other foods: Secondary | ICD-10-CM | POA: Diagnosis not present

## 2022-06-08 DIAGNOSIS — I248 Other forms of acute ischemic heart disease: Secondary | ICD-10-CM | POA: Diagnosis not present

## 2022-06-08 DIAGNOSIS — I5020 Unspecified systolic (congestive) heart failure: Secondary | ICD-10-CM | POA: Diagnosis not present

## 2022-06-08 DIAGNOSIS — B9789 Other viral agents as the cause of diseases classified elsewhere: Secondary | ICD-10-CM | POA: Diagnosis not present

## 2022-06-08 DIAGNOSIS — I5021 Acute systolic (congestive) heart failure: Secondary | ICD-10-CM | POA: Diagnosis not present

## 2022-06-08 DIAGNOSIS — R0689 Other abnormalities of breathing: Secondary | ICD-10-CM | POA: Diagnosis not present

## 2022-06-08 DIAGNOSIS — I10 Essential (primary) hypertension: Secondary | ICD-10-CM | POA: Diagnosis not present

## 2022-06-08 DIAGNOSIS — F17210 Nicotine dependence, cigarettes, uncomplicated: Secondary | ICD-10-CM | POA: Diagnosis present

## 2022-06-08 DIAGNOSIS — J441 Chronic obstructive pulmonary disease with (acute) exacerbation: Secondary | ICD-10-CM | POA: Diagnosis not present

## 2022-06-08 DIAGNOSIS — R062 Wheezing: Secondary | ICD-10-CM | POA: Diagnosis not present

## 2022-06-08 LAB — RESPIRATORY PANEL BY PCR

## 2022-06-08 LAB — BASIC METABOLIC PANEL
Anion gap: 5 (ref 5–15)
BUN: 7 mg/dL (ref 6–20)
CO2: 28 mmol/L (ref 22–32)
Calcium: 8.5 mg/dL — ABNORMAL LOW (ref 8.9–10.3)
Chloride: 105 mmol/L (ref 98–111)
Creatinine, Ser: 0.67 mg/dL (ref 0.44–1.00)
GFR, Estimated: >60 mL/min (ref >60)
Glucose, Bld: 131 mg/dL — ABNORMAL HIGH (ref 70–99)
Potassium: 3.6 mmol/L (ref 3.5–5.1)
Sodium: 138 mmol/L (ref 135–145)

## 2022-06-08 LAB — CBC
HCT: 50.2 % — ABNORMAL HIGH (ref 36.0–46.0)
Hemoglobin: 15.6 g/dL — ABNORMAL HIGH (ref 12.0–15.0)
MCH: 29.8 pg (ref 26.0–34.0)
MCHC: 31.1 g/dL (ref 30.0–36.0)
MCV: 95.8 fL (ref 80.0–100.0)
Platelets: 205 10*3/uL (ref 150–400)
RBC: 5.24 MIL/uL — ABNORMAL HIGH (ref 3.87–5.11)
RDW: 13.1 % (ref 11.5–15.5)
WBC: 11.3 10*3/uL — ABNORMAL HIGH (ref 4.0–10.5)
nRBC: 0 % (ref 0.0–0.2)

## 2022-06-08 LAB — TROPONIN I (HIGH SENSITIVITY)
Troponin I (High Sensitivity): 6 ng/L (ref <18)
Troponin I (High Sensitivity): 18 ng/L — ABNORMAL HIGH (ref <18)

## 2022-06-08 LAB — BRAIN NATRIURETIC PEPTIDE: B Natriuretic Peptide: 128.3 pg/mL — ABNORMAL HIGH (ref 0.0–100.0)

## 2022-06-08 LAB — I-STAT BETA HCG BLOOD, ED (MC, WL, AP ONLY): I-stat hCG, quantitative: <5 m[IU]/mL (ref <5)

## 2022-06-08 LAB — SARS CORONAVIRUS 2 BY RT PCR: SARS Coronavirus 2 by RT PCR: NEGATIVE

## 2022-06-08 MED ORDER — MONTELUKAST SODIUM 10 MG PO TABS
10.0000 mg | ORAL_TABLET | Freq: Every day | ORAL | Status: DC
  Administered 2022-06-08 – 2022-06-11 (×4): 10 mg via ORAL
  Filled 2022-06-08 (×4): qty 1

## 2022-06-08 MED ORDER — MOMETASONE FURO-FORMOTEROL FUM 200-5 MCG/ACT IN AERO
2.0000 | INHALATION_SPRAY | Freq: Two times a day (BID) | RESPIRATORY_TRACT | Status: DC
  Administered 2022-06-08 – 2022-06-12 (×8): 2 via RESPIRATORY_TRACT
  Filled 2022-06-08: qty 8.8

## 2022-06-08 MED ORDER — METHYLPREDNISOLONE SODIUM SUCC 125 MG IJ SOLR: 125.0000 mg | Freq: Once | INTRAMUSCULAR | Status: DC

## 2022-06-08 MED ORDER — ACETAMINOPHEN 650 MG RE SUPP: 650.0000 mg | Freq: Four times a day (QID) | RECTAL | Status: DC | PRN

## 2022-06-08 MED ORDER — BUPROPION HCL ER (XL) 150 MG PO TB24
150.0000 mg | ORAL_TABLET | Freq: Every day | ORAL | Status: DC
  Administered 2022-06-09 – 2022-06-12 (×4): 150 mg via ORAL
  Filled 2022-06-08 (×4): qty 1

## 2022-06-08 MED ORDER — IPRATROPIUM-ALBUTEROL 0.5-2.5 (3) MG/3ML IN SOLN
3.0000 mL | Freq: Three times a day (TID) | RESPIRATORY_TRACT | Status: DC
  Administered 2022-06-09 – 2022-06-12 (×9): 3 mL via RESPIRATORY_TRACT
  Filled 2022-06-08 (×10): qty 3

## 2022-06-08 MED ORDER — AZITHROMYCIN 250 MG PO TABS
500.0000 mg | ORAL_TABLET | Freq: Every day | ORAL | Status: AC
  Administered 2022-06-09 – 2022-06-12 (×4): 500 mg via ORAL
  Filled 2022-06-08 (×4): qty 2

## 2022-06-08 MED ORDER — ALBUTEROL SULFATE (2.5 MG/3ML) 0.083% IN NEBU
2.5000 mg | INHALATION_SOLUTION | RESPIRATORY_TRACT | Status: DC | PRN
  Administered 2022-06-09 – 2022-06-10 (×3): 2.5 mg via RESPIRATORY_TRACT
  Filled 2022-06-08 (×3): qty 3

## 2022-06-08 MED ORDER — ALBUTEROL SULFATE (2.5 MG/3ML) 0.083% IN NEBU
10.0000 mg | INHALATION_SOLUTION | Freq: Once | RESPIRATORY_TRACT | Status: AC
  Administered 2022-06-08: 10 mg via RESPIRATORY_TRACT
  Filled 2022-06-08: qty 12

## 2022-06-08 MED ORDER — IPRATROPIUM BROMIDE 0.02 % IN SOLN
0.5000 mg | Freq: Once | RESPIRATORY_TRACT | Status: AC
  Administered 2022-06-08: 0.5 mg via RESPIRATORY_TRACT
  Filled 2022-06-08: qty 2.5

## 2022-06-08 MED ORDER — IPRATROPIUM-ALBUTEROL 0.5-2.5 (3) MG/3ML IN SOLN
3.0000 mL | Freq: Four times a day (QID) | RESPIRATORY_TRACT | Status: DC
  Administered 2022-06-08: 3 mL via RESPIRATORY_TRACT
  Filled 2022-06-08: qty 3

## 2022-06-08 MED ORDER — SODIUM CHLORIDE 0.9 % IV SOLN
500.0000 mg | INTRAVENOUS | Status: AC
  Administered 2022-06-08: 500 mg via INTRAVENOUS
  Filled 2022-06-08: qty 5

## 2022-06-08 MED ORDER — ONDANSETRON HCL 4 MG/2ML IJ SOLN
4.0000 mg | Freq: Four times a day (QID) | INTRAMUSCULAR | Status: DC | PRN
  Administered 2022-06-08: 4 mg via INTRAVENOUS
  Filled 2022-06-08: qty 2

## 2022-06-08 MED ORDER — METHYLPREDNISOLONE SODIUM SUCC 40 MG IJ SOLR
40.0000 mg | Freq: Two times a day (BID) | INTRAMUSCULAR | Status: AC
  Administered 2022-06-08 – 2022-06-09 (×2): 40 mg via INTRAVENOUS
  Filled 2022-06-08 (×2): qty 1

## 2022-06-08 MED ORDER — ONDANSETRON HCL 4 MG PO TABS: 4.0000 mg | ORAL_TABLET | Freq: Four times a day (QID) | ORAL | Status: DC | PRN

## 2022-06-08 MED ORDER — PHENTERMINE HCL 15 MG PO CAPS: 15.0000 mg | ORAL_CAPSULE | ORAL | Status: DC

## 2022-06-08 MED ORDER — PREDNISONE 20 MG PO TABS
40.0000 mg | ORAL_TABLET | Freq: Every day | ORAL | Status: AC
  Administered 2022-06-09 – 2022-06-12 (×4): 40 mg via ORAL
  Filled 2022-06-08 (×4): qty 2

## 2022-06-08 MED ORDER — POTASSIUM CHLORIDE CRYS ER 20 MEQ PO TBCR
40.0000 meq | EXTENDED_RELEASE_TABLET | Freq: Once | ORAL | Status: AC
  Administered 2022-06-08: 40 meq via ORAL
  Filled 2022-06-08: qty 2

## 2022-06-08 MED ORDER — FUROSEMIDE 10 MG/ML IJ SOLN
40.0000 mg | Freq: Once | INTRAMUSCULAR | Status: AC
  Administered 2022-06-08: 40 mg via INTRAVENOUS
  Filled 2022-06-08: qty 4

## 2022-06-08 MED ORDER — ENOXAPARIN SODIUM 80 MG/0.8ML IJ SOSY
80.0000 mg | PREFILLED_SYRINGE | INTRAMUSCULAR | Status: DC
  Administered 2022-06-08 – 2022-06-10 (×3): 80 mg via SUBCUTANEOUS
  Filled 2022-06-08 (×3): qty 0.8

## 2022-06-08 MED ORDER — MAGNESIUM SULFATE 2 GM/50ML IV SOLN
2.0000 g | Freq: Once | INTRAVENOUS | Status: AC
  Administered 2022-06-08: 2 g via INTRAVENOUS
  Filled 2022-06-08: qty 50

## 2022-06-08 MED ORDER — ACETAMINOPHEN 325 MG PO TABS: 650.0000 mg | ORAL_TABLET | Freq: Four times a day (QID) | ORAL | Status: DC | PRN

## 2022-06-08 NOTE — Assessment & Plan Note (Signed)
Few cigarettes a day.  Smoking cessation recommended, modalities discussed.

## 2022-06-08 NOTE — ED Provider Notes (Signed)
I saw and evaluated the patient, reviewed the resident's note and I agree with the findings and plan.  EKG Interpretation  Date/Time:  Tuesday June 08 2022 13:27:49 EDT Ventricular Rate:  133 PR Interval:  136 QRS Duration: 89 QT Interval:  308 QTC Calculation: 459 R Axis:   49 Text Interpretation: Sinus tachycardia Consider right atrial enlargement Confirmed by Lacretia Leigh ([id number]) on 06/08/2022 2:34:37 PM    46 year old female who presents with shortness of breath.  History of asthma.  On arrival here, patient was hypoxic.  Placed on oxygen here.  Started on magnesium, she was albuterol treatment.  Received Solu-Medrol prior to arrival.  She is COVID-negative.  Patient will require admission   Lacretia Leigh, MD 06/08/22 1451

## 2022-06-08 NOTE — Assessment & Plan Note (Signed)
BMI 64

## 2022-06-08 NOTE — ED Provider Notes (Signed)
[facility] DEPT Provider Note   CSN: [id number] Arrival date & time: 06/08/22  1316     History Past Medical History:  Diagnosis Date   Anemia    Asthma    Bronchitis    COPD (chronic obstructive pulmonary disease) ([place])    Dyspnea    with asthma flair   Obesity    Respiratory arrest ([place]) 04/04/2020   Social History   Socioeconomic History   Marital status: Single    Spouse name: Not on file   Number of children: Not on file   Years of education: Not on file   Highest education level: Not on file  Occupational History   Occupation: customer service rep  Tobacco Use   Smoking status: Light Smoker    Packs/day: 0.25    Years: 15.00    Total pack years: 3.75    Types: Cigarettes    Last attempt to quit: 09/16/2014    Years since quitting: 7.7   Smokeless tobacco: Never   Tobacco comments:    she continues to smoke 1-2 cigarettes a day  Vaping Use   Vaping Use: Never used  Substance and Sexual Activity   Alcohol use: Yes    Alcohol/week: 0.0 standard drinks of alcohol    Comment: occas.   Drug use: Yes    Types: Marijuana    Comment: occas.   Sexual activity: Not Currently  Other Topics Concern   Not on file  Social History Narrative   DIET: regular      DO YOU DRINK/EAT THINGS WITH CAFFEINE: Yes      MARITAL STATUS: Single      WHAT YEAR WERE YOU MARRIED:       DO YOU LIVE IN A HOUSE, APARTMENT, ASSISTED LIVING, CONDO TRAILER ETC.:  House       IS IT ONE OR MORE STORIES: 1      HOW MANY PERSONS LIVE IN YOUR HOME: 3      DO YOU HAVE PETS IN YOUR HOME: No      CURRENT OR PAST PROFESSION: customer Service Rep      DO YOU EXERCISE: very little      WHAT TYPE AND HOW OFTEN:   Social Determinants of Health   Financial Resource Strain: Not on file  Food Insecurity: Not on file  Transportation Needs: Not on file  Physical Activity: Not on file  Stress: Not on file  Social Connections: Not on file  Intimate Partner  Violence: Not on file    Chief Complaint  Patient presents with   Shortness of Breath    April Ellis is a 46 y.o. female.  April Ellis is a 46 yo F with a PMH of asthma and class 3 obesity who presents with acute dyspnea.    She states that her symptoms began a day before presenting to the ED. She states that a family member recently had symptoms of URI, and that she began having sneezing and cough that was occasionally productive of yellow sputum. She eventually became progressively more dyspneic, however, noting that are dyspnea was mostly with exertion. She states that she also started to note wheezing. She used her albuterol neb ~10x over the last day to day and a half. She denies any chest pain at rest or with exertion.   Of note, she does have some pitting edema of her bilateral lower extremities that has been ongoing for the past several months for which she will follow  up with cardiology 9/14. She was noted to have dilated pulmonary artery c/w PAH.   Finally, patient continues to smoke ~0.5 ppd which she has done for the last 15-20 years.   She denies any fever, chills, changes in vision, acute worsening of occasional headaches she gets intermittently, abdominal pain, diarrhea, constipation, or dysuria.    Shortness of Breath      Home Medications Prior to Admission medications   Medication Sig Start Date End Date Taking? Authorizing Provider  albuterol (VENTOLIN HFA) 108 (90 Base) MCG/ACT inhaler Inhale 2 puffs into the lungs every 4 (four) hours as needed for wheezing or shortness of breath. 04/21/22   [name], FNP  budesonide (PULMICORT) 1 MG/2ML nebulizer solution TAKE 2 MLS (1 MG TOTAL) BY NEBULIZATION 4 (FOUR) TIMES DAILY AS NEEDED. 09/22/21   [name], FNP  budesonide-formoterol [facility]) 160-4.5 MCG/ACT inhaler Take 2 puffs twice daily 04/21/22   [name], FNP  buPROPion (WELLBUTRIN XL) 150 MG 24 hr tablet TAKE 1 TABLET BY MOUTH EVERY DAY IN  THE MORNING 04/21/22   [name], FNP  doxycycline (MONODOX) 100 MG capsule Take 1 capsule (100 mg total) by mouth 2 (two) times daily. 08/25/21   [name], FNP  EPINEPHrine 0.3 mg/0.3 mL IJ SOAJ injection Inject 0.3 mg into the muscle as needed for anaphylaxis. 08/25/21   [name], FNP  fluticasone (FLONASE SENSIMIST) 27.5 MCG/SPRAY nasal spray Place 2 sprays into the nose daily. 07/21/21   [name], FNP  ipratropium-albuterol (DUONEB) 0.5-2.5 (3) MG/3ML SOLN INHALE 1 VIAL BY MOUTH VIA NEBULIZER EVERY 6 HOURS AS NEEDED 04/21/22   [name], FNP  montelukast (SINGULAIR) 10 MG tablet Take 1 tablet (10 mg total) by mouth at bedtime. TAKE 1 TABLET BY MOUTH EVERYDAY AT BEDTIME Strength: 10 mg 04/21/22   [name], FNP  nicotine (NICODERM CQ - DOSED IN MG/24 HOURS) 21 mg/24hr patch Place 1 patch (21 mg total) onto the skin daily. 04/21/22 04/21/23  [name], FNP  ondansetron (ZOFRAN) 4 MG tablet Take 1 tablet (4 mg total) by mouth every 6 (six) hours. 05/01/22   Elgie Congo, MD  phentermine 15 MG capsule Take 1 capsule (15 mg total) by mouth every morning. 04/21/22   [name], FNP      Allergies    Penicillins    Review of Systems   Review of Systems  Respiratory:  Positive for shortness of breath.   All other systems reviewed and are negative.   Physical Exam Updated Vital Signs BP (!) 145/92   Pulse (!) 118   Temp (!) 97.4 F (36.3 C) (Oral)   Resp 17   Ht '5\' 1"'$  (1.549 m)   Wt (!) 154.2 kg   SpO2 94%   BMI 64.24 kg/m  Physical Exam Constitutional:      General: She is not in acute distress.    Appearance: She is obese. She is not ill-appearing.  Cardiovascular:     Rate and Rhythm: Tachycardia present.     Heart sounds: No murmur heard. Pulmonary:     Effort: Pulmonary effort is normal. No tachypnea or accessory muscle usage.     Breath sounds: Examination of the right-upper field reveals wheezing. Examination of the left-upper field reveals  wheezing. Examination of the right-middle field reveals wheezing. Examination of the left-middle field reveals wheezing. Examination of the right-lower field reveals wheezing. Examination of the left-lower field reveals wheezing. Wheezing present.  Abdominal:     Palpations: Abdomen is soft.  Musculoskeletal:     Right lower leg: Edema present.     Left lower leg: Edema present.     Comments: 2+ BLE up to the knees   Skin:    General: Skin is warm.    ED Results / Procedures / Treatments   Labs (all labs ordered are listed, but only abnormal results are displayed) Labs Reviewed  BASIC METABOLIC PANEL - Abnormal; Notable for the following components:      Result Value   Glucose, Bld 131 (*)    Calcium 8.5 (*)    All other components within normal limits  CBC - Abnormal; Notable for the following components:   WBC 11.3 (*)    RBC 5.24 (*)    Hemoglobin 15.6 (*)    HCT 50.2 (*)    All other components within normal limits  SARS CORONAVIRUS 2 BY RT PCR  I-STAT BETA HCG BLOOD, ED ([facility], [facility], [facility] ONLY)  TROPONIN I (HIGH SENSITIVITY)    EKG EKG Interpretation  Date/Time:  Tuesday June 08 2022 13:27:49 EDT Ventricular Rate:  133 PR Interval:  136 QRS Duration: 89 QT Interval:  308 QTC Calculation: 459 R Axis:   49 Text Interpretation: Sinus tachycardia Consider right atrial enlargement Confirmed by Lacretia Leigh ([id number]) on 06/08/2022 2:34:40 PM  Radiology DG Chest Portable 1 View  Result Date: 06/08/2022 CLINICAL DATA:  Shortness of breath EXAM: PORTABLE CHEST 1 VIEW COMPARISON:  05/01/2022 FINDINGS: Overlying artifact. Heart and mediastinal shadows appear normal. The lungs are clear allowing for the overlying artifact. No visible pleural fluid. IMPRESSION: Overlying artifact.  No active disease identified. Electronically Signed   By: Nelson Chimes M.D.   On: 06/08/2022 14:17      Medications Ordered in ED Medications  magnesium sulfate IVPB 2 g 50 mL (2 g Intravenous New  Bag/Given 06/08/22 1450)  albuterol (PROVENTIL) (2.5 MG/3ML) 0.083% nebulizer solution 10 mg (10 mg Nebulization Given 06/08/22 1458)  ipratropium (ATROVENT) nebulizer solution 0.5 mg (0.5 mg Nebulization Given 06/08/22 1458)    ED Course/ Medical Decision Making/ A&P                           Medical Decision Making Patient presents with worsening dyspnea over the last day to day and a half. Her symptoms began with upper respiratory tract infection and eventually progressed to worsening dyspnea and wheezing. Her dyspnea and wheezing prompted her to call the ED. The most likely reason for her current symptoms is an asthma exacerbation in the setting of URI and continued cigarette smoking. Also considered possible pneumonia given patient's productive cough and mild leukocytosis, however her CXR is negative for consolidation or infiltrate. She has also not had any fever and it may be that her leukocytosis is related to her recently given IV solumedrol and demargination. She has had no chest pain, her EKG shows sinus tachycardia with no ST segment or T wave changes concerning for acute ischemia, and her troponin was negative making ACS less likely. Finally, she does have lower extremity edema which she says has been ongoing for the past several months and for which she has a planned outpatient visit with cardiology. However, this is most likely a result of R sided heart dysfunction in the setting of her obesity and asthma. Low suspicion for heart failure exacerbation as primary driver of her current symptoms.  -Give duonebs -IV magnesium  -Admit patient given hypoxemia   Amount and/or Complexity of Data  Reviewed Labs: ordered. Radiology: ordered.  Risk Prescription drug management.    Final Clinical Impression(s) / ED Diagnoses Final diagnoses:  None    Rx / DC Orders ED Discharge Orders     None         Rick Duff, MD 06/08/22 1515    Lacretia Leigh, MD 06/09/22 [id number]

## 2022-06-08 NOTE — Hospital Course (Addendum)
Mrs. April Ellis is a 46 y.o. F with asthma/COPD overlap syndrome, not on home O2, still smoking, iron deficiency anemia, depression and MO who presented with URI symptoms for a few days followed by chest tightness, shortness of breath and wheezing.  In the ER, CXR without obvious infiltrate.  Had marked wheezing, hypoxia to 86% requiring 6L O2.  COVID/flu-.  Was given steroids by EMS and given albuterol and bronchodilators and magnesium here.

## 2022-06-08 NOTE — ED Notes (Signed)
RT to come administer 10 mg neb

## 2022-06-08 NOTE — ED Notes (Signed)
Pt on 6L [place]

## 2022-06-08 NOTE — H&P (Signed)
History and Physical    Patient: April Ellis ZWC:[id number] DOB: [birth year] DOA: 06/08/2022 DOS: the patient was seen and examined on 06/08/2022 PCP: [name], [place]  Patient coming from: Home  Chief Complaint:  Chief Complaint  Patient presents with   Shortness of Breath       HPI:  Mrs. April Ellis is a 46 y.o. F with asthma/COPD overlap syndrome, not on home O2, still smoking, iron deficiency anemia, depression and MO who presented with URI symptoms for a few days followed by chest tightness, shortness of breath and wheezing.  In the ER, CXR without obvious infiltrate.  Had marked wheezing, hypoxia to 86% requiring 6L O2.  COVID/flu-.  Was given steroids by EMS and given albuterol and bronchodilators and magnesium here.      Review of Systems  Constitutional:  Negative for chills and fever.  HENT:  Positive for congestion and sore throat.   Respiratory:  Positive for shortness of breath and wheezing. Negative for cough and sputum production.   Cardiovascular:  Positive for leg swelling. Negative for chest pain, palpitations, orthopnea and PND.  All other systems reviewed and are negative.    Past Medical History:  Diagnosis Date   Anemia    Asthma    Bronchitis    COPD (chronic obstructive pulmonary disease) ([place])    Dyspnea    with asthma flair   Obesity    Respiratory arrest ([place]) 04/04/2020   Past Surgical History:  Procedure Laterality Date   DENTAL SURGERY     2 teeth pulled, 11/22/14   DILITATION & CURRETTAGE/HYSTROSCOPY WITH NOVASURE ABLATION N/A 03/05/2020   Procedure: DILATATION & CURETTAGE/HYSTEROSCOPY WITH NOVASURE ABLATION;  Surgeon: Molli Posey, MD;  Location: [facility];  Service: Gynecology;  Laterality: N/A;   NO PAST SURGERIES     Social History:  reports that she has been smoking cigarettes. She has a 3.75 pack-year smoking history. She has never used smokeless tobacco. She reports current alcohol use. She reports current drug use. Drug:  Marijuana.  Allergies  Allergen Reactions   Penicillins     Childhood Allergy Did it involve swelling of the face/tongue/throat, SOB, or low BP? UNK Did it involve sudden or severe rash/hives, skin peeling, or any reaction on the inside of your mouth or nose? UNK Did you need to seek medical attention at a hospital or doctor's office? UNK When did it last happen?      more than 10 years If all above answers are "NO", may proceed with cephalosporin use.     Family History  Problem Relation Age of Onset   Diabetes Mother    Cancer Mother    Diabetes Sister    Hypertension Sister    Hypertension Sister    Urticaria Neg Hx    Immunodeficiency Neg Hx    Eczema Neg Hx    Asthma Neg Hx    Angioedema Neg Hx    Allergic rhinitis Neg Hx     Prior to Admission medications   Medication Sig Start Date End Date Taking? Authorizing Provider  albuterol (VENTOLIN HFA) 108 (90 Base) MCG/ACT inhaler Inhale 2 puffs into the lungs every 4 (four) hours as needed for wheezing or shortness of breath. 04/21/22   [name], FNP  budesonide (PULMICORT) 1 MG/2ML nebulizer solution TAKE 2 MLS (1 MG TOTAL) BY NEBULIZATION 4 (FOUR) TIMES DAILY AS NEEDED. 09/22/21   [name], FNP  budesonide-formoterol [facility]) 160-4.5 MCG/ACT inhaler Take 2 puffs twice daily 04/21/22   [name],  FNP  buPROPion (WELLBUTRIN XL) 150 MG 24 hr tablet TAKE 1 TABLET BY MOUTH EVERY DAY IN THE MORNING 04/21/22   [name], FNP  EPINEPHrine 0.3 mg/0.3 mL IJ SOAJ injection Inject 0.3 mg into the muscle as needed for anaphylaxis. 08/25/21   [name], FNP  fluticasone (FLONASE SENSIMIST) 27.5 MCG/SPRAY nasal spray Place 2 sprays into the nose daily. 07/21/21   [name], FNP  ipratropium-albuterol (DUONEB) 0.5-2.5 (3) MG/3ML SOLN INHALE 1 VIAL BY MOUTH VIA NEBULIZER EVERY 6 HOURS AS NEEDED 04/21/22   [name], FNP  montelukast (SINGULAIR) 10 MG tablet Take 1 tablet (10 mg total) by mouth at bedtime. TAKE 1  TABLET BY MOUTH EVERYDAY AT BEDTIME Strength: 10 mg 04/21/22   [name], FNP  nicotine (NICODERM CQ - DOSED IN MG/24 HOURS) 21 mg/24hr patch Place 1 patch (21 mg total) onto the skin daily. 04/21/22 04/21/23  [name], FNP  ondansetron (ZOFRAN) 4 MG tablet Take 1 tablet (4 mg total) by mouth every 6 (six) hours. 05/01/22   Elgie Congo, MD  phentermine 15 MG capsule Take 1 capsule (15 mg total) by mouth every morning. 04/21/22   [name], FNP    Physical Exam: Vitals:   06/08/22 1345 06/08/22 1430 06/08/22 1501 06/08/22 1545  BP: (!) 157/109 (!) 145/92  (!) 145/86  Pulse: (!) 124 (!) 118  (!) 106  Resp: '13 17  18  '$ Temp:      TempSrc:      SpO2: 95% 94% 96% 96%  Weight:      Height:       Obese adult female, lying in bed, getting a nebulizer. Anicteric, conjunctival pink, lids and lashes normal.  No nasal deformity, discharge, or epistaxis. Tachycardic, regular, no murmurs, JVP not visible due to body habitus, 2+ pitting edema of the bilateral lower extremities Respiratory rate increased, getting nebulizer treatment at the moment.  Wheezing with inspiration and expiration bilaterally in all lung fields.  No rales.  Lung sounds diminished due to body habitus Abdomen soft without tenderness to palpation or guarding No ascites or distention Attention normal, affect appropriate, judgment and insight appear normal, face symmetric, speech fluent, moves all extremities with generalized weakness but symmetric strength    Data Reviewed: Basic metabolic panel unremarkable Hemogram shows mild polycythemia due to smoking COVID-negative Troponin negative Pregnancy test negative Chest x-ray personally reviewed, shows some possible edema Brain atretic peptide elevated given weight    Assessment and Plan: * Acute respiratory failure with hypoxia (HCC)    Asthma, chronic, unspecified asthma severity, with acute exacerbation    Asthma-COPD overlap syndrome (HCC) FEV1 60%  in 2015, up to 90% in 2017.  She uses Symbicort with good adherence, typically has no daily symptoms.  This episode started with nasal congestion, sneezing, runny nose, then progressed to shortness of breath, cough, dyspnea not relieved with home inhalers.  No significant orthopnea. -IV Solu-Medrol for 24 hours, then transition to prednisone - Scheduled bronchodilators - As needed bronchodilators - Azithromycin - Continue home Symbicort, Singulair - Check respiratory virus panel       Leg swelling Pitting edema up to the knees in both legs.  Her prior EF was 55%, have to wonder if there is some diastolic heart failure or right heart failure contributing. - Obtain echocardiogram - IV furosemide once and monitor response - Strict ins and outs, daily weights, daily BMP  Tobacco abuse Few cigarettes a day.  Smoking cessation recommended, modalities discussed.  Class 3  severe obesity due to excess calories without serious comorbidity with body mass index (BMI) of 60.0 to 69.9 in adult ([place]) BMI 64         Advance Care Planning: Full code  Consults: None  Family Communication: None present  Severity of Illness: The appropriate patient status for this patient is INPATIENT. Inpatient status is judged to be reasonable and necessary in order to provide the required intensity of service to ensure the patient's safety. The patient's presenting symptoms, physical exam findings, and initial radiographic and laboratory data in the context of their chronic comorbidities is felt to place them at high risk for further clinical deterioration. Furthermore, it is not anticipated that the patient will be medically stable for discharge from the hospital within 2 midnights of admission.   * I certify that at the point of admission it is my clinical judgment that the patient will require inpatient hospital care spanning beyond 2 midnights from the point of admission due to high intensity of  service, high risk for further deterioration and high frequency of surveillance required.*  Author: Edwin Dada, MD 06/08/2022 4:30 PM  For on call review [email or web address].

## 2022-06-08 NOTE — Assessment & Plan Note (Signed)
Pitting edema up to the knees in both legs.  Her prior EF was 55%, have to wonder if there is some diastolic heart failure or right heart failure contributing. - Obtain echocardiogram - IV furosemide once and monitor response - Strict ins and outs, daily weights, daily BMP

## 2022-06-08 NOTE — Assessment & Plan Note (Signed)
FEV1 60% in 2015, up to 90% in 2017.  She uses Symbicort with good adherence, typically has no daily symptoms.  This episode started with nasal congestion, sneezing, runny nose, then progressed to shortness of breath, cough, dyspnea not relieved with home inhalers.  No significant orthopnea. -IV Solu-Medrol for 24 hours, then transition to prednisone - Scheduled bronchodilators - As needed bronchodilators - Azithromycin - Continue home Symbicort, Singulair - Check respiratory virus panel

## 2022-06-08 NOTE — ED Triage Notes (Addendum)
Pt arrives via [facility] from home for SOB. Pt reported that she was self-administering a duoneb on arrival. Received additional two doses by EMS. Pt given 125 mg solumedrol IV by EMS per pt request.   Pt initially on NRB on arrival. Room air saturation 85% during triage. Pt titrated to 6 lpm O2 via [place] with improvement to 91%.

## 2022-06-09 ENCOUNTER — Inpatient Hospital Stay (HOSPITAL_COMMUNITY): Payer: BC Managed Care – PPO

## 2022-06-09 DIAGNOSIS — I5041 Acute combined systolic (congestive) and diastolic (congestive) heart failure: Secondary | ICD-10-CM | POA: Diagnosis not present

## 2022-06-09 DIAGNOSIS — J9601 Acute respiratory failure with hypoxia: Secondary | ICD-10-CM | POA: Diagnosis not present

## 2022-06-09 LAB — BASIC METABOLIC PANEL
Anion gap: 8 (ref 5–15)
BUN: 11 mg/dL (ref 6–20)
CO2: 29 mmol/L (ref 22–32)
Calcium: 9 mg/dL (ref 8.9–10.3)
Chloride: 102 mmol/L (ref 98–111)
Creatinine, Ser: 0.67 mg/dL (ref 0.44–1.00)
GFR, Estimated: >60 mL/min (ref >60)
Glucose, Bld: 114 mg/dL — ABNORMAL HIGH (ref 70–99)
Potassium: 4.3 mmol/L (ref 3.5–5.1)
Sodium: 139 mmol/L (ref 135–145)

## 2022-06-09 LAB — ECHOCARDIOGRAM COMPLETE
AR max vel: 2.63 cm2
AV Peak grad: 11.5 mmHg
Ao pk vel: 1.7 m/s
Area-P 1/2: 3.5 cm2
Height: 61 in
S' Lateral: 4.8 cm
Weight: 5474.46 oz

## 2022-06-09 LAB — CBC
HCT: 49.6 % — ABNORMAL HIGH (ref 36.0–46.0)
Hemoglobin: 15.4 g/dL — ABNORMAL HIGH (ref 12.0–15.0)
MCH: 30 pg (ref 26.0–34.0)
MCHC: 31 g/dL (ref 30.0–36.0)
MCV: 96.5 fL (ref 80.0–100.0)
Platelets: 163 10*3/uL (ref 150–400)
RBC: 5.14 MIL/uL — ABNORMAL HIGH (ref 3.87–5.11)
RDW: 13.1 % (ref 11.5–15.5)
WBC: 8.5 10*3/uL (ref 4.0–10.5)
nRBC: 0 % (ref 0.0–0.2)

## 2022-06-09 LAB — HIV ANTIBODY (ROUTINE TESTING W REFLEX): HIV Screen 4th Generation wRfx: NONREACTIVE

## 2022-06-09 LAB — TROPONIN I (HIGH SENSITIVITY): Troponin I (High Sensitivity): 4 ng/L (ref <18)

## 2022-06-09 MED ORDER — FUROSEMIDE 10 MG/ML IJ SOLN
40.0000 mg | Freq: Once | INTRAMUSCULAR | Status: AC
  Administered 2022-06-09: 40 mg via INTRAVENOUS
  Filled 2022-06-09: qty 4

## 2022-06-09 NOTE — Plan of Care (Signed)
  Problem: Education: Goal: Knowledge of General Education information will improve Description Including pain rating scale, medication(s)/side effects and non-pharmacologic comfort measures Outcome: Progressing   Problem: Health Behavior/Discharge Planning: Goal: Ability to manage health-related needs will improve Outcome: Progressing   

## 2022-06-09 NOTE — Progress Notes (Signed)
Cardiology Office Note:    Date:  12/10/2022   ID:  April Ellis, DOB Feb 21, 1976, MRN [id number]  PCP:  [name], [place] [facility] Cardiologist:  Lenna Sciara, MD Referring MD: Elgie Congo, MD   Chief Complaint/Reason for Referral: Dyspnea  PATIENT DID NOT APPEAR FOR APPOINTMENT DUE TO HOSPITALIZATION   ASSESSMENT:    1. Dyspnea, unspecified type   2. BMI 60.0-69.9, adult ([place])     PLAN:    In order of problems listed above: 1.  Dyspnea: 2.  Elevated BMI:   History of Present Illness:    FOCUSED PROBLEM LIST:   1.  COPD with asthma depression; FEV1 2015 of 1.35 L 2.  Elevated BMI of 60 ongoing tobacco abuse 4.  Anemia 5.  Depression  The patient is a 46 y.o. female with the indicated medical history here for issues regarding shortness of breath.          Current Medications: No outpatient medications have been marked as taking for the 06/10/22 encounter (Office Visit) with Early Osmond, MD.     Allergies:    Penicillins and Pineapple   Social History:   Social History   Tobacco Use   Smoking status: Former    Packs/day: 0.25    Years: 15.00    Additional pack years: 0.00    Total pack years: 3.75    Types: Cigarettes    Quit date: 11/19/2022    Years since quitting: 0.0    Passive exposure: Past   Smokeless tobacco: Never   Tobacco comments:    Patient quit smoking about 1 week ago PAP 11/26/2022  Vaping Use   Vaping Use: Never used  Substance Use Topics   Alcohol use: Yes    Alcohol/week: 0.0 standard drinks of alcohol    Comment: occas.   Drug use: Yes    Types: Marijuana    Comment: occas.     Family Hx: Family History  Problem Relation Age of Onset   Diabetes Mother    Cancer Mother    Diabetes Sister    Hypertension Sister    Hypertension Sister    Urticaria Neg Hx    Immunodeficiency Neg Hx    Eczema Neg Hx    Asthma Neg Hx    Angioedema Neg Hx    Allergic rhinitis Neg Hx      Review  of Systems:   Please see the history of present illness.    All other systems reviewed and are negative.     EKGs/Labs/Other Test Reviewed:    EKG:  EKG performed August 2023 that I personally reviewed demonstrates tachycardia  Prior CV studies: Echocardiogram pending  Other studies Reviewed: Review of the additional studies/records demonstrates: No aortic atherosclerosis or coronary artery calcification on CT imaging August 2023  Recent Labs: 06/16/2022: TSH 3.034 12/06/2022: B Natriuretic Peptide 33.7 12/07/2022: ALT 16; Hemoglobin 14.4; Magnesium 2.4; Platelets 183 12/08/2022: BUN 16; Creatinine, Ser 0.62; Potassium 4.4; Sodium 138   Recent Lipid Panel Lab Results  Component Value Date/Time   CHOL 143 07/19/2022 12:02 PM   TRIG 90 07/19/2022 12:02 PM   HDL 85 07/19/2022 12:02 PM   LDLCALC 41 07/19/2022 12:02 PM    Risk Assessment/Calculations:     Physical Exam:     Signed, Early Osmond, MD  12/10/2022 8:55 AM    [facility] [address] [place], [place], [place]  [address] Phone: [phone]; Fax: [phone]  Note:  This document was prepared using Dragon voice recognition software and may include unintentional dictation errors. 

## 2022-06-09 NOTE — Progress Notes (Signed)
PROGRESS NOTE  April Ellis:[id number] DOB: 14-Feb-1976 DOA: 06/08/2022 PCP: [name], FNP   LOS: 1 day   Brief Narrative / Interim history: 46 year old female with asthma/COPD overlap syndrome, not on home oxygen, ongoing tobacco use, obesity, here with acute onset of chest tightness associated with shortness of breath and wheezing.  Chest x-ray on admission without obvious infiltrates.  Initially required 6 L nasal cannula, was given steroids, nebulizers, antibiotics and was admitted to the hospital  Significant events: 9/12-admit  Significant imaging / results / micro data: Chest x-ray 9/12-no active disease identified 2D echo 9/13-LVEF 35%, global hypokinesis, LV was dilated.  Grade 2 diastolic dysfunction present.  RV was normal, there is mild elevation of the PA pressure to 43.5.  Subjective / 24h Interval events: She is feeling overall better.  Breathing appears improved, but still gets short winded when she moves around.  No chest pain  Assesement and Plan: Principal Problem:   Acute respiratory failure with hypoxia (HCC) Active Problems:   Asthma, chronic, unspecified asthma severity, with acute exacerbation   Asthma-COPD overlap syndrome (HCC)   Class 3 severe obesity due to excess calories without serious comorbidity with body mass index (BMI) of 60.0 to 69.9 in adult [facility])   Tobacco abuse   Leg swelling   Principal problem Acute hypoxic respiratory failure due to asthma exacerbation, possibly a component of acute systolic CHF-improved today however persistently hypoxic into the mid 80s, and gets dyspneic with ambulation.  Continue supplemental oxygen, wean off as tolerated.  Continue steroids, azithromycin, nebulizers  Active problems Acute combined systolic and diastolic CHF-possibly acute, but not sure.  She has been having some progressive dyspnea for several weeks now and actually had a cardiology appointment tomorrow.  2D echo done shows an EF of 35%,  grade 2 diastolic dysfunction.  This is new, her prior EF was normal few years back.  Cardiology consulted.  She was on phentermine for about a month mid July-mid August, do not think it can cause depressed EF but defer to cardiology.  She had some lower extremity swelling, and despite the fact that the chest x-ray was not clear in terms of fluid overload I think there is a component of that.  Repeat IV Lasix this afternoon  Troponin elevation-mild, overall flat, likely in the setting of #1.  Probably demand ischemia.  Given chest pain yesterday and depressed EF we will recheck to ensure stability  Tobacco use-she is working with her PCP regarding cessation  Obesity, morbid-BMI 64.  She tells me that over the last few years she might have lost close to 100 pounds.  Actively working with her PCP regarding weight loss  Depression-continue Wellbutrin.   Scheduled Meds:  azithromycin  500 mg Oral Daily   buPROPion  150 mg Oral Daily   enoxaparin (LOVENOX) injection  80 mg Subcutaneous Q24H   furosemide  40 mg Intravenous Once   ipratropium-albuterol  3 mL Nebulization TID   mometasone-formoterol  2 puff Inhalation BID   montelukast  10 mg Oral QHS   predniSONE  40 mg Oral Q breakfast   Continuous Infusions: PRN Meds:.acetaminophen **OR** acetaminophen, albuterol, ondansetron **OR** ondansetron (ZOFRAN) IV  Current Outpatient Medications  Medication Instructions   albuterol (VENTOLIN HFA) 108 (90 Base) MCG/ACT inhaler 2 puffs, Inhalation, Every 4 hours PRN   budesonide (PULMICORT) 1 mg, Nebulization, 4 times daily PRN   budesonide-formoterol (SYMBICORT) 160-4.5 MCG/ACT inhaler Take 2 puffs twice daily   buPROPion (WELLBUTRIN XL) 150 MG 24  hr tablet TAKE 1 TABLET BY MOUTH EVERY DAY IN THE MORNING   cetirizine (ZYRTEC) 10 mg, Oral, Daily PRN   EPINEPHrine (EPI-PEN) 0.3 mg, Intramuscular, As needed   fluticasone (FLONASE SENSIMIST) 27.5 MCG/SPRAY nasal spray 2 sprays, Nasal, Daily    ipratropium-albuterol (DUONEB) 0.5-2.5 (3) MG/3ML SOLN INHALE 1 VIAL BY MOUTH VIA NEBULIZER EVERY 6 HOURS AS NEEDED   montelukast (SINGULAIR) 10 mg, Oral, Daily at bedtime, TAKE 1 TABLET BY MOUTH EVERYDAY AT BEDTIME<BR>Strength: 10 mg   nicotine (NICODERM CQ - DOSED IN MG/24 HOURS) 21 mg, Transdermal, Every 24 hours   nicotine polacrilex (NICORETTE) 4 mg, Oral, As needed   ondansetron (ZOFRAN) 4 mg, Oral, Every 6 hours   phentermine 15 mg, Oral, BH-each morning    Diet Orders (From admission, onward)     Start     Ordered   06/08/22 1621  Diet regular Room service appropriate? Yes; Fluid consistency: Thin  Diet effective now       Question Answer Comment  Room service appropriate? Yes   Fluid consistency: Thin      06/08/22 1620            DVT prophylaxis:   Lovenox   Lab Results  Component Value Date   PLT 163 06/09/2022      Code Status: Full Code  Family Communication: No family at bedside  Status is: Inpatient Remains inpatient appropriate because: Persistently hypoxic, new onset CHFrEF   Level of care: Progressive  Consultants:  Cardiology  Objective: Vitals:   06/09/22 0545 06/09/22 0753 06/09/22 1004 06/09/22 1300  BP: 138/79  126/72   Pulse: 93  72   Resp: 20  18   Temp: 97.6 F (36.4 C)  98.4 F (36.9 C)   TempSrc: Oral  Oral   SpO2: 95% 94% 94% 94%  Weight:      Height:        Intake/Output Summary (Last 24 hours) at 06/09/2022 1611 Last data filed at 06/09/2022 1300 Gross per 24 hour  Intake 1210 ml  Output 3350 ml  Net -2140 ml   Wt Readings from Last 3 Encounters:  06/09/22 (!) 155.2 kg  05/01/22 (!) 156.5 kg  04/21/22 (!) 156.5 kg    Examination:  Constitutional: NAD Eyes: no scleral icterus ENMT: Mucous membranes are moist.  Neck: normal, supple Respiratory: Faint end expiratory wheezing, no crackles.  Tachypneic at times, overall distant breath sounds Cardiovascular: Regular rate and rhythm, no murmurs / rubs / gallops.   Trace edema Abdomen: non distended, no tenderness. Bowel sounds positive.  Musculoskeletal: no clubbing / cyanosis.  Skin: no rashes Neurologic: non focal  Data Reviewed: I have independently reviewed following labs and imaging studies   CBC Recent Labs  Lab 06/08/22 1345 06/09/22 0519  WBC 11.3* 8.5  HGB 15.6* 15.4*  HCT 50.2* 49.6*  PLT 205 163  MCV 95.8 96.5  MCH 29.8 30.0  MCHC 31.1 31.0  RDW 13.1 13.1    Recent Labs  Lab 06/08/22 1345 06/09/22 0825  NA 138 139  K 3.6 4.3  CL 105 102  CO2 28 29  GLUCOSE 131* 114*  BUN 7 11  CREATININE 0.67 0.67  CALCIUM 8.5* 9.0  BNP 128.3*  --     ------------------------------------------------------------------------------------------------------------------ No results for input(s): "CHOL", "HDL", "LDLCALC", "TRIG", "CHOLHDL", "LDLDIRECT" in the last 72 hours.  Lab Results  Component Value Date   HGBA1C 5.0 04/21/2022   ------------------------------------------------------------------------------------------------------------------ No results for input(s): "TSH", "T4TOTAL", "T3FREE", "THYROIDAB" in the  last 72 hours.  Invalid input(s): "FREET3"  Cardiac Enzymes No results for input(s): "CKMB", "TROPONINI", "MYOGLOBIN" in the last 168 hours.  Invalid input(s): "CK" ------------------------------------------------------------------------------------------------------------------    Component Value Date/Time   BNP 128.3 (H) 06/08/2022 1345    CBG: No results for input(s): "GLUCAP" in the last 168 hours.  Recent Results (from the past 240 hour(s))  SARS Coronavirus 2 by RT PCR (hospital order, performed in [facility] hospital lab) *cepheid single result test* Anterior Nasal Swab     Status: None   Collection Time: 06/08/22  1:49 PM   Specimen: Anterior Nasal Swab  Result Value Ref Range Status   SARS Coronavirus 2 by RT PCR NEGATIVE NEGATIVE Final    Comment: (NOTE) SARS-CoV-2 target nucleic acids are NOT  DETECTED.  The SARS-CoV-2 RNA is generally detectable in upper and lower respiratory specimens during the acute phase of infection. The lowest concentration of SARS-CoV-2 viral copies this assay can detect is 250 copies / mL. A negative result does not preclude SARS-CoV-2 infection and should not be used as the sole basis for treatment or other patient management decisions.  A negative result may occur with improper specimen collection / handling, submission of specimen other than nasopharyngeal swab, presence of viral mutation(s) within the areas targeted by this assay, and inadequate number of viral copies (<250 copies / mL). A negative result must be combined with clinical observations, patient history, and epidemiological information.  Fact Sheet for Patients:   https://www.patel.info/  Fact Sheet for Healthcare Providers: https://hall.com/  This test is not yet approved or  cleared by the [place] FDA and has been authorized for detection and/or diagnosis of SARS-CoV-2 by FDA under an Emergency Use Authorization (EUA).  This EUA will remain in effect (meaning this test can be used) for the duration of the COVID-19 declaration under Section 564(b)(1) of the Act, 21 U.S.C. section 360bbb-3(b)(1), unless the authorization is terminated or revoked sooner.  Performed at [facility], [place] [address], [place], [place] [address]   Respiratory (~20 pathogens) panel by PCR     Status: Abnormal   Collection Time: 06/08/22  1:49 PM   Specimen: Nasopharyngeal Swab; Respiratory  Result Value Ref Range Status   Adenovirus NOT DETECTED NOT DETECTED Final   Coronavirus 229E NOT DETECTED NOT DETECTED Final    Comment: (NOTE) The Coronavirus on the Respiratory Panel, DOES NOT test for the novel  Coronavirus (2019 nCoV)    Coronavirus HKU1 NOT DETECTED NOT DETECTED Final   Coronavirus NL63 NOT DETECTED NOT DETECTED Final    Coronavirus OC43 NOT DETECTED NOT DETECTED Final   Metapneumovirus NOT DETECTED NOT DETECTED Final   Rhinovirus / Enterovirus DETECTED (A) NOT DETECTED Final   Influenza A NOT DETECTED NOT DETECTED Final   Influenza B NOT DETECTED NOT DETECTED Final   Parainfluenza Virus 1 NOT DETECTED NOT DETECTED Final   Parainfluenza Virus 2 NOT DETECTED NOT DETECTED Final   Parainfluenza Virus 3 NOT DETECTED NOT DETECTED Final   Parainfluenza Virus 4 NOT DETECTED NOT DETECTED Final   Respiratory Syncytial Virus NOT DETECTED NOT DETECTED Final   Bordetella pertussis NOT DETECTED NOT DETECTED Final   Bordetella Parapertussis NOT DETECTED NOT DETECTED Final   Chlamydophila pneumoniae NOT DETECTED NOT DETECTED Final   Mycoplasma pneumoniae NOT DETECTED NOT DETECTED Final    Comment: Performed at [facility], [place]. [address], [place], [place] [address]     Radiology Studies: ECHOCARDIOGRAM COMPLETE  Result Date: 06/09/2022  ECHOCARDIOGRAM REPORT   Patient Name:   CHRISHAWNA FARINA [facility] Date of Exam: 06/09/2022 Medical Rec #:  [id number]       Height:       61.0 in Accession #:    [id number]      Weight:       342.2 lb Date of Birth:  06-13-1976       BSA:          2.373 m Patient Age:    64 years        BP:           126/72 mmHg Patient Gender: F               HR:           78 bpm. Exam Location:  Inpatient Procedure: 2D Echo, Cardiac Doppler and Color Doppler Indications:    CHF  History:        Patient has prior history of Echocardiogram examinations, most                 recent 05/13/2014. COPD.  Sonographer:    Jefferey Pica Referring Phys: [id number] CHRISTOPHER P DANFORD IMPRESSIONS  1. Left ventricular ejection fraction, by estimation, is 35%. The left ventricle has moderately decreased function. The left ventricle demonstrates global hypokinesis. The left ventricular internal cavity size was mildly dilated. Left ventricular diastolic parameters are consistent with Grade II diastolic dysfunction  (pseudonormalization).  2. Right ventricular systolic function is normal. The right ventricular size is normal. There is mildly elevated pulmonary artery systolic pressure. The estimated right ventricular systolic pressure is 83.1 mmHg.  3. Left atrial size was mildly dilated.  4. Right atrial size was mildly dilated.  5. The mitral valve is normal in structure. Trivial mitral valve regurgitation. No evidence of mitral stenosis.  6. The aortic valve is tricuspid. Aortic valve regurgitation is not visualized. No aortic stenosis is present.  7. The inferior vena cava is dilated in size with <50% respiratory variability, suggesting right atrial pressure of 15 mmHg. FINDINGS  Left Ventricle: Left ventricular ejection fraction, by estimation, is 35%. The left ventricle has moderately decreased function. The left ventricle demonstrates global hypokinesis. The left ventricular internal cavity size was mildly dilated. There is no left ventricular hypertrophy. Left ventricular diastolic parameters are consistent with Grade II diastolic dysfunction (pseudonormalization). Right Ventricle: The right ventricular size is normal. No increase in right ventricular wall thickness. Right ventricular systolic function is normal. There is mildly elevated pulmonary artery systolic pressure. The tricuspid regurgitant velocity is 2.67  m/s, and with an assumed right atrial pressure of 15 mmHg, the estimated right ventricular systolic pressure is 51.7 mmHg. Left Atrium: Left atrial size was mildly dilated. Right Atrium: Right atrial size was mildly dilated. Pericardium: There is no evidence of pericardial effusion. Mitral Valve: The mitral valve is normal in structure. Trivial mitral valve regurgitation. No evidence of mitral valve stenosis. Tricuspid Valve: The tricuspid valve is normal in structure. Tricuspid valve regurgitation is trivial. Aortic Valve: The aortic valve is tricuspid. Aortic valve regurgitation is not visualized. No  aortic stenosis is present. Aortic valve peak gradient measures 11.5 mmHg. Pulmonic Valve: The pulmonic valve was normal in structure. Pulmonic valve regurgitation is trivial. Aorta: The aortic root is normal in size and structure. Venous: The inferior vena cava is dilated in size with less than 50% respiratory variability, suggesting right atrial pressure of 15 mmHg. IAS/Shunts: No atrial level shunt detected by color flow Doppler.  LEFT  VENTRICLE PLAX 2D LVIDd:         6.35 cm   Diastology LVIDs:         4.80 cm   LV e' medial:    6.06 cm/s LV PW:         1.15 cm   LV E/e' medial:  13.1 LV IVS:        1.05 cm   LV e' lateral:   6.75 cm/s LVOT diam:     2.00 cm   LV E/e' lateral: 11.7 LV SV:         92 LV SV Index:   39 LVOT Area:     3.14 cm  IVC IVC diam: 3.20 cm LEFT ATRIUM             Index        RIGHT ATRIUM           Index LA diam:        3.10 cm 1.31 cm/m   RA Area:     21.20 cm LA Vol (A2C):   63.6 ml 26.80 ml/m  RA Volume:   67.10 ml  28.28 ml/m LA Vol (A4C):   59.9 ml 25.24 ml/m LA Biplane Vol: 65.0 ml 27.39 ml/m  AORTIC VALVE                 PULMONIC VALVE AV Area (Vmax): 2.63 cm     PV Vmax:       0.93 m/s AV Vmax:        169.50 cm/s  PV Peak grad:  3.5 mmHg AV Peak Grad:   11.5 mmHg LVOT Vmax:      142.00 cm/s LVOT Vmean:     96.000 cm/s LVOT VTI:       0.294 m  AORTA Ao Root diam: 2.90 cm Ao Asc diam:  3.20 cm MITRAL VALVE               TRICUSPID VALVE MV Area (PHT): 3.50 cm    TR Peak grad:   28.5 mmHg MV Decel Time: 217 msec    TR Vmax:        267.00 cm/s MV E velocity: 79.10 cm/s MV A velocity: 70.50 cm/s  SHUNTS MV E/A ratio:  1.12        Systemic VTI:  0.29 m                            Systemic Diam: 2.00 cm Dalton McleanMD Electronically signed by Franki Monte Signature Date/Time: 06/09/2022/3:15:54 PM    Final      Marzetta Board, MD, PhD [facility]  Between 7 am - 7 pm I am available, please contact me via Amion (for emergencies) or Securechat (non urgent  messages)  Between 7 pm - 7 am I am not available, please contact night coverage MD/APP via Amion

## 2022-06-10 ENCOUNTER — Ambulatory Visit: Payer: BC Managed Care – PPO | Admitting: Internal Medicine

## 2022-06-10 DIAGNOSIS — I420 Dilated cardiomyopathy: Secondary | ICD-10-CM | POA: Diagnosis not present

## 2022-06-10 DIAGNOSIS — J9601 Acute respiratory failure with hypoxia: Secondary | ICD-10-CM | POA: Diagnosis not present

## 2022-06-10 DIAGNOSIS — I5041 Acute combined systolic (congestive) and diastolic (congestive) heart failure: Secondary | ICD-10-CM

## 2022-06-10 LAB — BASIC METABOLIC PANEL
Anion gap: 4 — ABNORMAL LOW (ref 5–15)
BUN: 18 mg/dL (ref 6–20)
CO2: 30 mmol/L (ref 22–32)
Calcium: 9 mg/dL (ref 8.9–10.3)
Chloride: 103 mmol/L (ref 98–111)
Creatinine, Ser: 0.73 mg/dL (ref 0.44–1.00)
GFR, Estimated: >60 mL/min (ref >60)
Glucose, Bld: 121 mg/dL — ABNORMAL HIGH (ref 70–99)
Potassium: 4.4 mmol/L (ref 3.5–5.1)
Sodium: 137 mmol/L (ref 135–145)

## 2022-06-10 LAB — CBC
HCT: 50.8 % — ABNORMAL HIGH (ref 36.0–46.0)
Hemoglobin: 15.9 g/dL — ABNORMAL HIGH (ref 12.0–15.0)
MCH: 29.9 pg (ref 26.0–34.0)
MCHC: 31.3 g/dL (ref 30.0–36.0)
MCV: 95.5 fL (ref 80.0–100.0)
Platelets: 186 10*3/uL (ref 150–400)
RBC: 5.32 MIL/uL — ABNORMAL HIGH (ref 3.87–5.11)
RDW: 13.2 % (ref 11.5–15.5)
WBC: 11.5 10*3/uL — ABNORMAL HIGH (ref 4.0–10.5)
nRBC: 0 % (ref 0.0–0.2)

## 2022-06-10 LAB — MAGNESIUM: Magnesium: 2.1 mg/dL (ref 1.7–2.4)

## 2022-06-10 MED ORDER — FUROSEMIDE 10 MG/ML IJ SOLN
40.0000 mg | Freq: Two times a day (BID) | INTRAMUSCULAR | Status: DC
  Administered 2022-06-10 – 2022-06-11 (×4): 40 mg via INTRAVENOUS
  Filled 2022-06-10 (×4): qty 4

## 2022-06-10 MED ORDER — SODIUM CHLORIDE 0.9 % IV SOLN: INTRAVENOUS | Status: DC

## 2022-06-10 MED ORDER — SACUBITRIL-VALSARTAN 24-26 MG PO TABS
1.0000 | ORAL_TABLET | Freq: Two times a day (BID) | ORAL | Status: DC
  Administered 2022-06-10 – 2022-06-12 (×5): 1 via ORAL
  Filled 2022-06-10 (×5): qty 1

## 2022-06-10 MED ORDER — EMPAGLIFLOZIN 10 MG PO TABS
10.0000 mg | ORAL_TABLET | Freq: Every day | ORAL | Status: DC
  Administered 2022-06-10 – 2022-06-12 (×3): 10 mg via ORAL
  Filled 2022-06-10 (×3): qty 1

## 2022-06-10 MED ORDER — SODIUM CHLORIDE 0.9% FLUSH: 3.0000 mL | INTRAVENOUS | Status: DC | PRN

## 2022-06-10 MED ORDER — SPIRONOLACTONE 12.5 MG HALF TABLET
12.5000 mg | ORAL_TABLET | Freq: Every day | ORAL | Status: DC
  Administered 2022-06-10 – 2022-06-12 (×3): 12.5 mg via ORAL
  Filled 2022-06-10 (×3): qty 1

## 2022-06-10 MED ORDER — SODIUM CHLORIDE 0.9% FLUSH
3.0000 mL | Freq: Two times a day (BID) | INTRAVENOUS | Status: DC
  Administered 2022-06-10 – 2022-06-11 (×3): 3 mL via INTRAVENOUS

## 2022-06-10 MED ORDER — SODIUM CHLORIDE 0.9 % IV SOLN: 250.0000 mL | INTRAVENOUS | Status: DC | PRN

## 2022-06-10 MED ORDER — ASPIRIN 81 MG PO CHEW
81.0000 mg | CHEWABLE_TABLET | ORAL | Status: AC
  Administered 2022-06-11: 81 mg via ORAL
  Filled 2022-06-10: qty 1

## 2022-06-10 NOTE — Consult Note (Signed)
Cardiology Consultation   Patient ID: April Ellis MRN: [id number]; DOB: [birth year]  Admit date: 06/08/2022 Date of Consult: 06/10/2022  PCP:  [name], [place] [facility] Cardiologist:  None     Patient Profile:   April Ellis is a 46 y.o. female with a hx of asthma/COPD overlap syndrome, ongoing tobacco use, obesity who is being seen 06/10/2022 for the evaluation of reduced EF at the request of Dr. Cruzita Lederer.  History of Present Illness:   April Ellis is a 46 year old female with above medical history does not follow with cardiology.  During a previous admission in 2015 for COPD exacerbation, chest x-ray showed cardiomegaly with mild vascular congestion.  Further evaluation, patient had an echocardiogram on 05/13/2014 that showed EF 55-60%, normal LV diastolic parameters, mildly thickened leaflets on the mitral valve, mildly dilated left atrium.  Over the past several weeks, patient has had some progressive dyspnea.  She was scheduled for cardiology appointment on 9/14, however presented to the ER prior to the appointment.  Patient presented to the ER via EMS on 9/12 complaining of shortness of breath.  She had been self administering a DuoNeb treatment at home when EMS arrived.  She received 2 additional treatments and was given IV solumedrol by EMS.  Upon arrival to the ED, oxygen saturation was 85% on room air.  Labs in the ED showed Na 138, K 3.6, creatinine 0.67, WBC 11.3, hemoglobin 15.6, platelets 205. hsTn 6>>18>>4. BNP mildly elevated to 128.3. Respiratory panel positive for rhinovirus.   EKG in the ED showed sinus tachycardia with a HR 133 BPM, probable right atrial enlargement. CXR showed no active disease   Echocardiogram from 9/13 showed LVEF 35%, local hypokinesis, grade 2 diastolic dysfunction, normal RV systolic function, mildly dilated LA and RA.  Cardiology was asked to consult due to reduced EF  On interview, patient reports that she has  chronic shortness of breath on exertion.  Her breathing had been stable up until a few days ago when she began to develop nasal congestion and other cold-like symptoms.  She went on to have worsening shortness of breath that initially improved with her DuoNeb treatments.  However, on 9/12, her breathing worsened to a point where she called EMS.  Since then, her breathing has improved with DuoNeb treatments, steroids.  She reports taking prednisone somewhat frequently in the outpatient setting.  She denies having any chest pain, orthopnea, cough.  She has had ankle edema on and off for the past several months.  She thought at first that it was related to the prednisone, however did make an appointment to see cardiology which was scheduled for today.  Currently, her breathing has returned to normal.  Patient denies any past medical history of diabetes, hypertension, hyperlipidemia.  She reportedly has been obese her whole life.  She also has had ongoing tobacco use for the past 20 years.  Denies any family history of cardiac problems.  Past Medical History:  Diagnosis Date   Anemia    Asthma    Bronchitis    COPD (chronic obstructive pulmonary disease) ([place])    Dyspnea    with asthma flair   Obesity    Respiratory arrest ([place]) 04/04/2020    Past Surgical History:  Procedure Laterality Date   DENTAL SURGERY     2 teeth pulled, 11/22/14   DILITATION & CURRETTAGE/HYSTROSCOPY WITH NOVASURE ABLATION N/A 03/05/2020   Procedure: DILATATION & CURETTAGE/HYSTEROSCOPY WITH NOVASURE ABLATION;  Surgeon: Molli Posey,  MD;  Location: [facility];  Service: Gynecology;  Laterality: N/A;   NO PAST SURGERIES       Home Medications:  Prior to Admission medications   Medication Sig Start Date End Date Taking? Authorizing Provider  albuterol (VENTOLIN HFA) 108 (90 Base) MCG/ACT inhaler Inhale 2 puffs into the lungs every 4 (four) hours as needed for wheezing or shortness of breath. Patient taking differently: Inhale  2 puffs into the lungs every 2 (two) hours as needed for wheezing or shortness of breath. 04/21/22  Yes [name], FNP  budesonide-formoterol (SYMBICORT) 160-4.5 MCG/ACT inhaler Take 2 puffs twice daily Patient taking differently: Inhale 2 puffs into the lungs 2 (two) times daily. 04/21/22  Yes [name], FNP  buPROPion (WELLBUTRIN XL) 150 MG 24 hr tablet TAKE 1 TABLET BY MOUTH EVERY DAY IN THE MORNING Patient taking differently: Take 150 mg by mouth in the morning. 04/21/22  Yes [name], FNP  cetirizine (ZYRTEC) 10 MG tablet Take 10 mg by mouth daily as needed for allergies or rhinitis.   Yes [provider]  EPINEPHrine 0.3 mg/0.3 mL IJ SOAJ injection Inject 0.3 mg into the muscle as needed for anaphylaxis. 08/25/21  Yes [name], FNP  ipratropium-albuterol (DUONEB) 0.5-2.5 (3) MG/3ML SOLN INHALE 1 VIAL BY MOUTH VIA NEBULIZER EVERY 6 HOURS AS NEEDED Patient taking differently: Take 3 mLs by nebulization every 2 (two) hours as needed (for shortness of breath or wheezing). 04/21/22  Yes [name], FNP  montelukast (SINGULAIR) 10 MG tablet Take 1 tablet (10 mg total) by mouth at bedtime. TAKE 1 TABLET BY MOUTH EVERYDAY AT BEDTIME Strength: 10 mg Patient taking differently: Take 10 mg by mouth at bedtime. 04/21/22  Yes [name], FNP  nicotine polacrilex (NICORETTE) 4 MG lozenge Take 4 mg by mouth as needed for smoking cessation.   Yes [provider]  ondansetron (ZOFRAN) 4 MG tablet Take 1 tablet (4 mg total) by mouth every 6 (six) hours. Patient taking differently: Take 4 mg by mouth every 6 (six) hours as needed for nausea or vomiting. 05/01/22  Yes Elgie Congo, MD  budesonide (PULMICORT) 1 MG/2ML nebulizer solution TAKE 2 MLS (1 MG TOTAL) BY NEBULIZATION 4 (FOUR) TIMES DAILY AS NEEDED. 09/22/21   [name], FNP  fluticasone (FLONASE SENSIMIST) 27.5 MCG/SPRAY nasal spray Place 2 sprays into the nose daily. Patient not taking: Reported on 06/08/2022  07/21/21   [name], FNP  nicotine (NICODERM CQ - DOSED IN MG/24 HOURS) 21 mg/24hr patch Place 1 patch (21 mg total) onto the skin daily. Patient not taking: Reported on 06/08/2022 04/21/22 04/21/23  [name], FNP  phentermine 15 MG capsule Take 1 capsule (15 mg total) by mouth every morning. Patient not taking: Reported on 06/08/2022 04/21/22   [name], FNP    Inpatient Medications: Scheduled Meds:  azithromycin  500 mg Oral Daily   buPROPion  150 mg Oral Daily   enoxaparin (LOVENOX) injection  80 mg Subcutaneous Q24H   furosemide  40 mg Intravenous Q12H   ipratropium-albuterol  3 mL Nebulization TID   mometasone-formoterol  2 puff Inhalation BID   montelukast  10 mg Oral QHS   predniSONE  40 mg Oral Q breakfast   Continuous Infusions:  PRN Meds: acetaminophen **OR** acetaminophen, albuterol, ondansetron **OR** ondansetron (ZOFRAN) IV  Allergies:    Allergies  Allergen Reactions   Penicillins Other (See Comments)    Childhood Allergy Did it involve swelling of the face/tongue/throat, SOB, or low BP? Tomasita Crumble  Did it involve sudden or severe rash/hives, skin peeling, or any reaction on the inside of your mouth or nose? UNK Did you need to seek medical attention at a hospital or doctor's office? UNK When did it last happen?      more than 10 years If all above answers are "NO", may proceed with cephalosporin use.    Pineapple Other (See Comments)    Tested allergic to this    Social History:   Social History   Socioeconomic History   Marital status: Single    Spouse name: Not on file   Number of children: Not on file   Years of education: Not on file   Highest education level: Not on file  Occupational History   Occupation: customer service rep  Tobacco Use   Smoking status: Light Smoker    Packs/day: 0.25    Years: 15.00    Total pack years: 3.75    Types: Cigarettes    Last attempt to quit: 09/16/2014    Years since quitting: 7.7   Smokeless tobacco:  Never   Tobacco comments:    she continues to smoke 1-2 cigarettes a day  Vaping Use   Vaping Use: Never used  Substance and Sexual Activity   Alcohol use: Yes    Alcohol/week: 0.0 standard drinks of alcohol    Comment: occas.   Drug use: Yes    Types: Marijuana    Comment: occas.   Sexual activity: Not Currently  Other Topics Concern   Not on file  Social History Narrative   DIET: regular      DO YOU DRINK/EAT THINGS WITH CAFFEINE: Yes      MARITAL STATUS: Single      WHAT YEAR WERE YOU MARRIED:       DO YOU LIVE IN A HOUSE, APARTMENT, ASSISTED LIVING, CONDO TRAILER ETC.:  House       IS IT ONE OR MORE STORIES: 1      HOW MANY PERSONS LIVE IN YOUR HOME: 3      DO YOU HAVE PETS IN YOUR HOME: No      CURRENT OR PAST PROFESSION: customer Service Rep      DO YOU EXERCISE: very little      WHAT TYPE AND HOW OFTEN:   Social Determinants of Health   Financial Resource Strain: Not on file  Food Insecurity: No Food Insecurity (06/08/2022)   Hunger Vital Sign    Worried About Running Out of Food in the Last Year: Never true    Ran Out of Food in the Last Year: Never true  Transportation Needs: No Transportation Needs (06/08/2022)   PRAPARE - Hydrologist (Medical): No    Lack of Transportation (Non-Medical): No  Physical Activity: Not on file  Stress: Not on file  Social Connections: Not on file  Intimate Partner Violence: Not At Risk (06/08/2022)   Humiliation, Afraid, Rape, and Kick questionnaire    Fear of Current or Ex-Partner: No    Emotionally Abused: No    Physically Abused: No    Sexually Abused: No    Family History:    Family History  Problem Relation Age of Onset   Diabetes Mother    Cancer Mother    Diabetes Sister    Hypertension Sister    Hypertension Sister    Urticaria Neg Hx    Immunodeficiency Neg Hx    Eczema Neg Hx    Asthma Neg Hx  Angioedema Neg Hx    Allergic rhinitis Neg Hx      ROS:  Please see the  history of present illness.   All other ROS reviewed and negative.     Physical Exam/Data:   Vitals:   06/09/22 2203 06/10/22 0500 06/10/22 0610 06/10/22 0856  BP: 130/82  (!) 152/91   Pulse: 73  74   Resp: 20  16   Temp: (!) 97.4 F (36.3 C)  (!) 97.4 F (36.3 C)   TempSrc: Oral  Oral   SpO2:   93% 95%  Weight:  (!) 154.8 kg    Height:        Intake/Output Summary (Last 24 hours) at 06/10/2022 0918 Last data filed at 06/10/2022 0600 Gross per 24 hour  Intake 480 ml  Output 2350 ml  Net -1870 ml      06/10/2022    5:00 AM 06/09/2022    5:00 AM 06/08/2022    5:38 PM  Last 3 Weights  Weight (lbs) 341 lb 3.2 oz 342 lb 2.5 oz 342 lb 1.6 oz  Weight (kg) 154.767 kg 155.2 kg 155.176 kg     Body mass index is 64.47 kg/m.  General:  Well nourished, well developed, in no acute distress. Sitting comfortably on the side of the bed  HEENT: normal Neck: no JVD Vascular: Radial pulses 2+ bilaterally Cardiac:  normal S1, S2; RRR; no murmur  Lungs: Distant lung sounds throughout. clear to auscultation bilaterally, no wheezing, rhonchi or rales. Normal WOB on room air  Abd: soft, nontender, no hepatomegaly  Ext: 2+ edema to the knees in BLE  Musculoskeletal:  No deformities, BUE and BLE strength normal and equal Skin: warm and dry  Neuro:  CNs 2-12 intact, no focal abnormalities noted Psych:  Normal affect   EKG:  The EKG was personally reviewed and demonstrates: Sinus tachycardia with a heart rate of 133 bpm, probable right atrial enlargement Telemetry:  Telemetry was personally reviewed and demonstrates:  NA   Relevant CV Studies:   Echocardiogram 06/09/2022    1. Left ventricular ejection fraction, by estimation, is 35%. The left  ventricle has moderately decreased function. The left ventricle  demonstrates global hypokinesis. The left ventricular internal cavity size  was mildly dilated. Left ventricular  diastolic parameters are consistent with Grade II diastolic  dysfunction  (pseudonormalization).   2. Right ventricular systolic function is normal. The right ventricular  size is normal. There is mildly elevated pulmonary artery systolic  pressure. The estimated right ventricular systolic pressure is 16.1 mmHg.   3. Left atrial size was mildly dilated.   4. Right atrial size was mildly dilated.   5. The mitral valve is normal in structure. Trivial mitral valve  regurgitation. No evidence of mitral stenosis.   6. The aortic valve is tricuspid. Aortic valve regurgitation is not  visualized. No aortic stenosis is present.   7. The inferior vena cava is dilated in size with <50% respiratory  variability, suggesting right atrial pressure of 15 mmHg.   Laboratory Data:  High Sensitivity Troponin:   Recent Labs  Lab 06/08/22 1345 06/08/22 1749 06/09/22 1556  TROPONINIHS 6 18* 4     Chemistry Recent Labs  Lab 06/08/22 1345 06/09/22 0825 06/10/22 0522  NA 138 139 137  K 3.6 4.3 4.4  CL 105 102 103  CO2 '28 29 30  '$ GLUCOSE 131* 114* 121*  BUN '7 11 18  '$ CREATININE 0.67 0.67 0.73  CALCIUM 8.5* 9.0 9.0  MG  --   --  2.1  GFRNONAA >60 >60 >60  ANIONGAP 5 8 4*    No results for input(s): "PROT", "ALBUMIN", "AST", "ALT", "ALKPHOS", "BILITOT" in the last 168 hours. Lipids No results for input(s): "CHOL", "TRIG", "HDL", "LABVLDL", "LDLCALC", "CHOLHDL" in the last 168 hours.  Hematology Recent Labs  Lab 06/08/22 1345 06/09/22 0519 06/10/22 0522  WBC 11.3* 8.5 11.5*  RBC 5.24* 5.14* 5.32*  HGB 15.6* 15.4* 15.9*  HCT 50.2* 49.6* 50.8*  MCV 95.8 96.5 95.5  MCH 29.8 30.0 29.9  MCHC 31.1 31.0 31.3  RDW 13.1 13.1 13.2  PLT 205 163 186   Thyroid No results for input(s): "TSH", "FREET4" in the last 168 hours.  BNP Recent Labs  Lab 06/08/22 1345  BNP 128.3*    DDimer No results for input(s): "DDIMER" in the last 168 hours.   Radiology/Studies:  ECHOCARDIOGRAM COMPLETE  Result Date: 06/09/2022    ECHOCARDIOGRAM REPORT   Patient Name:    April Ellis [facility] Date of Exam: 06/09/2022 Medical Rec #:  [id number]       Height:       61.0 in Accession #:    [id number]      Weight:       342.2 lb Date of Birth:  01/29/1976       BSA:          2.373 m Patient Age:    61 years        BP:           126/72 mmHg Patient Gender: F               HR:           78 bpm. Exam Location:  Inpatient Procedure: 2D Echo, Cardiac Doppler and Color Doppler Indications:    CHF  History:        Patient has prior history of Echocardiogram examinations, most                 recent 05/13/2014. COPD.  Sonographer:    Jefferey Pica Referring Phys: [id number] CHRISTOPHER P DANFORD IMPRESSIONS  1. Left ventricular ejection fraction, by estimation, is 35%. The left ventricle has moderately decreased function. The left ventricle demonstrates global hypokinesis. The left ventricular internal cavity size was mildly dilated. Left ventricular diastolic parameters are consistent with Grade II diastolic dysfunction (pseudonormalization).  2. Right ventricular systolic function is normal. The right ventricular size is normal. There is mildly elevated pulmonary artery systolic pressure. The estimated right ventricular systolic pressure is 30.8 mmHg.  3. Left atrial size was mildly dilated.  4. Right atrial size was mildly dilated.  5. The mitral valve is normal in structure. Trivial mitral valve regurgitation. No evidence of mitral stenosis.  6. The aortic valve is tricuspid. Aortic valve regurgitation is not visualized. No aortic stenosis is present.  7. The inferior vena cava is dilated in size with <50% respiratory variability, suggesting right atrial pressure of 15 mmHg. FINDINGS  Left Ventricle: Left ventricular ejection fraction, by estimation, is 35%. The left ventricle has moderately decreased function. The left ventricle demonstrates global hypokinesis. The left ventricular internal cavity size was mildly dilated. There is no left ventricular hypertrophy. Left ventricular diastolic  parameters are consistent with Grade II diastolic dysfunction (pseudonormalization). Right Ventricle: The right ventricular size is normal. No increase in right ventricular wall thickness. Right ventricular systolic function is normal. There is mildly elevated pulmonary artery systolic pressure. The tricuspid regurgitant velocity is 2.67  m/s, and with an assumed right  atrial pressure of 15 mmHg, the estimated right ventricular systolic pressure is 34.7 mmHg. Left Atrium: Left atrial size was mildly dilated. Right Atrium: Right atrial size was mildly dilated. Pericardium: There is no evidence of pericardial effusion. Mitral Valve: The mitral valve is normal in structure. Trivial mitral valve regurgitation. No evidence of mitral valve stenosis. Tricuspid Valve: The tricuspid valve is normal in structure. Tricuspid valve regurgitation is trivial. Aortic Valve: The aortic valve is tricuspid. Aortic valve regurgitation is not visualized. No aortic stenosis is present. Aortic valve peak gradient measures 11.5 mmHg. Pulmonic Valve: The pulmonic valve was normal in structure. Pulmonic valve regurgitation is trivial. Aorta: The aortic root is normal in size and structure. Venous: The inferior vena cava is dilated in size with less than 50% respiratory variability, suggesting right atrial pressure of 15 mmHg. IAS/Shunts: No atrial level shunt detected by color flow Doppler.  LEFT VENTRICLE PLAX 2D LVIDd:         6.35 cm   Diastology LVIDs:         4.80 cm   LV e' medial:    6.06 cm/s LV PW:         1.15 cm   LV E/e' medial:  13.1 LV IVS:        1.05 cm   LV e' lateral:   6.75 cm/s LVOT diam:     2.00 cm   LV E/e' lateral: 11.7 LV SV:         92 LV SV Index:   39 LVOT Area:     3.14 cm  IVC IVC diam: 3.20 cm LEFT ATRIUM             Index        RIGHT ATRIUM           Index LA diam:        3.10 cm 1.31 cm/m   RA Area:     21.20 cm LA Vol (A2C):   63.6 ml 26.80 ml/m  RA Volume:   67.10 ml  28.28 ml/m LA Vol (A4C):   59.9  ml 25.24 ml/m LA Biplane Vol: 65.0 ml 27.39 ml/m  AORTIC VALVE                 PULMONIC VALVE AV Area (Vmax): 2.63 cm     PV Vmax:       0.93 m/s AV Vmax:        169.50 cm/s  PV Peak grad:  3.5 mmHg AV Peak Grad:   11.5 mmHg LVOT Vmax:      142.00 cm/s LVOT Vmean:     96.000 cm/s LVOT VTI:       0.294 m  AORTA Ao Root diam: 2.90 cm Ao Asc diam:  3.20 cm MITRAL VALVE               TRICUSPID VALVE MV Area (PHT): 3.50 cm    TR Peak grad:   28.5 mmHg MV Decel Time: 217 msec    TR Vmax:        267.00 cm/s MV E velocity: 79.10 cm/s MV A velocity: 70.50 cm/s  SHUNTS MV E/A ratio:  1.12        Systemic VTI:  0.29 m                            Systemic Diam: 2.00 cm Dalton McleanMD Electronically signed by Franki Monte Signature Date/Time: 06/09/2022/3:15:54 PM  Final    DG Chest Portable 1 View  Result Date: 06/08/2022 CLINICAL DATA:  Shortness of breath EXAM: PORTABLE CHEST 1 VIEW COMPARISON:  05/01/2022 FINDINGS: Overlying artifact. Heart and mediastinal shadows appear normal. The lungs are clear allowing for the overlying artifact. No visible pleural fluid. IMPRESSION: Overlying artifact.  No active disease identified. Electronically Signed   By: Nelson Chimes M.D.   On: 06/08/2022 14:17     Assessment and Plan:   Acute on Chronic Combined Systolic and Diastolic Heart Failure  - Patient complained of having progressive dyspnea and ankle edema for several weeks. She was planning to have an outpatient cardiology appointment, but she came to the hospital prior  - Echo this admission showed EF 35%, grade II diastolic dysfunction, normal RV systolic function. Note her EF was 55-60% in 2015 - CXR without active disease  - BNP mildly elevated to 128.3. Note, this could be elevated due to patient's obesity (BMI 64.47)  - Patient has a past medical history of obesity and has been smoking cigarettes for over 20 years.  Given her newly reduced EF, I believe she would benefit from ischemic evaluation at this  time. Not a candidate for coronary CT due to weight  - Plan for right/left heart catheterization tomorrow. - Start GDMT as tolerated.  - Today, start low dose entresto. If BP tolerates, can try to add a BB tomorrow (likely toprol-XL given asthma)  - Start jardiance 10 mg daily  - Ankle edema has been improving with IV lasix 40 mg BID. Output 1.6 L urine yesterday. Renal function stable. Continue current lasix dose   Shared Decision Making/Informed Consent The risks [stroke (1 in 1000), death (1 in 1000), kidney failure [usually temporary] (1 in 500), bleeding (1 in 200), allergic reaction [possibly serious] (1 in 200)], benefits (diagnostic support and management of coronary artery disease) and alternatives of a cardiac catheterization were discussed in detail with Ms. Myhand and she is willing to proceed.   Troponin Elevation  - hsTn 6>>18>>4  - Denies chest pain  - Very minimally elevated to 18, but trended back down to 4. Likely due to acute respiratory failure   Tobacco Use  - Discussed tobacco cessation  - Patient is working with PCP to quit   Otherwise Per primary  - Asthma exacerbation  - Depression    Risk Assessment/Risk Scores:    New York Heart Association (NYHA) Functional Class NYHA Class III        For questions or updates, please contact [place] Please consult [email or web address] for contact info under    Signed, Margie Billet, PA-C  06/10/2022 9:18 AM

## 2022-06-10 NOTE — H&P (View-Only) (Signed)
Cardiology Consultation   Patient ID: April Ellis MRN: [id number]; DOB: 11-12-1975  Admit date: 06/08/2022 Date of Consult: 06/10/2022  PCP:  [name], [facility] Cardiologist:  None     Patient Profile:   April Ellis is a 46 y.o. female with a hx of asthma/COPD overlap syndrome, ongoing tobacco use, obesity who is being seen 06/10/2022 for the evaluation of reduced EF at the request of Dr. Cruzita Lederer.  History of Present Illness:   April Ellis is a 46 year old female with above medical history does not follow with cardiology.  During a previous admission in 2015 for COPD exacerbation, chest x-ray showed cardiomegaly with mild vascular congestion.  Further evaluation, patient had an echocardiogram on 05/13/2014 that showed EF 55-60%, normal LV diastolic parameters, mildly thickened leaflets on the mitral valve, mildly dilated left atrium.  Over the past several weeks, patient has had some progressive dyspnea.  She was scheduled for cardiology appointment on 9/14, however presented to the ER prior to the appointment.  Patient presented to the ER via EMS on 9/12 complaining of shortness of breath.  She had been self administering a DuoNeb treatment at home when EMS arrived.  She received 2 additional treatments and was given IV solumedrol by EMS.  Upon arrival to the ED, oxygen saturation was 85% on room air.  Labs in the ED showed Na 138, K 3.6, creatinine 0.67, WBC 11.3, hemoglobin 15.6, platelets 205. hsTn 6>>18>>4. BNP mildly elevated to 128.3. Respiratory panel positive for rhinovirus.   EKG in the ED showed sinus tachycardia with a HR 133 BPM, probable right atrial enlargement. CXR showed no active disease   Echocardiogram from 9/13 showed LVEF 35%, local hypokinesis, grade 2 diastolic dysfunction, normal RV systolic function, mildly dilated LA and RA.  Cardiology was asked to consult due to reduced EF  On interview, patient reports that she has  chronic shortness of breath on exertion.  Her breathing had been stable up until a few days ago when she began to develop nasal congestion and other cold-like symptoms.  She went on to have worsening shortness of breath that initially improved with her DuoNeb treatments.  However, on 9/12, her breathing worsened to a point where she called EMS.  Since then, her breathing has improved with DuoNeb treatments, steroids.  She reports taking prednisone somewhat frequently in the outpatient setting.  She denies having any chest pain, orthopnea, cough.  She has had ankle edema on and off for the past several months.  She thought at first that it was related to the prednisone, however did make an appointment to see cardiology which was scheduled for today.  Currently, her breathing has returned to normal.  Patient denies any past medical history of diabetes, hypertension, hyperlipidemia.  She reportedly has been obese her whole life.  She also has had ongoing tobacco use for the past 20 years.  Denies any family history of cardiac problems.  Past Medical History:  Diagnosis Date   Anemia    Asthma    Bronchitis    COPD (chronic obstructive pulmonary disease) ([place])    Dyspnea    with asthma flair   Obesity    Respiratory arrest ([place]) 04/04/2020    Past Surgical History:  Procedure Laterality Date   DENTAL SURGERY     2 teeth pulled, 11/22/14   DILITATION & CURRETTAGE/HYSTROSCOPY WITH NOVASURE ABLATION N/A 03/05/2020   Procedure: DILATATION & CURETTAGE/HYSTEROSCOPY WITH NOVASURE ABLATION;  Surgeon: Molli Posey,  MD;  Location: [facility];  Service: Gynecology;  Laterality: N/A;   NO PAST SURGERIES       Home Medications:  Prior to Admission medications   Medication Sig Start Date End Date Taking? Authorizing Provider  albuterol (VENTOLIN HFA) 108 (90 Base) MCG/ACT inhaler Inhale 2 puffs into the lungs every 4 (four) hours as needed for wheezing or shortness of breath. Patient taking differently: Inhale  2 puffs into the lungs every 2 (two) hours as needed for wheezing or shortness of breath. 04/21/22  Yes [name], FNP  budesonide-formoterol (SYMBICORT) 160-4.5 MCG/ACT inhaler Take 2 puffs twice daily Patient taking differently: Inhale 2 puffs into the lungs 2 (two) times daily. 04/21/22  Yes [name], FNP  buPROPion (WELLBUTRIN XL) 150 MG 24 hr tablet TAKE 1 TABLET BY MOUTH EVERY DAY IN THE MORNING Patient taking differently: Take 150 mg by mouth in the morning. 04/21/22  Yes [name], FNP  cetirizine (ZYRTEC) 10 MG tablet Take 10 mg by mouth daily as needed for allergies or rhinitis.   Yes [provider]  EPINEPHrine 0.3 mg/0.3 mL IJ SOAJ injection Inject 0.3 mg into the muscle as needed for anaphylaxis. 08/25/21  Yes [name], FNP  ipratropium-albuterol (DUONEB) 0.5-2.5 (3) MG/3ML SOLN INHALE 1 VIAL BY MOUTH VIA NEBULIZER EVERY 6 HOURS AS NEEDED Patient taking differently: Take 3 mLs by nebulization every 2 (two) hours as needed (for shortness of breath or wheezing). 04/21/22  Yes [name], FNP  montelukast (SINGULAIR) 10 MG tablet Take 1 tablet (10 mg total) by mouth at bedtime. TAKE 1 TABLET BY MOUTH EVERYDAY AT BEDTIME Strength: 10 mg Patient taking differently: Take 10 mg by mouth at bedtime. 04/21/22  Yes [name], FNP  nicotine polacrilex (NICORETTE) 4 MG lozenge Take 4 mg by mouth as needed for smoking cessation.   Yes [provider]  ondansetron (ZOFRAN) 4 MG tablet Take 1 tablet (4 mg total) by mouth every 6 (six) hours. Patient taking differently: Take 4 mg by mouth every 6 (six) hours as needed for nausea or vomiting. 05/01/22  Yes Elgie Congo, MD  budesonide (PULMICORT) 1 MG/2ML nebulizer solution TAKE 2 MLS (1 MG TOTAL) BY NEBULIZATION 4 (FOUR) TIMES DAILY AS NEEDED. 09/22/21   [name], FNP  fluticasone (FLONASE SENSIMIST) 27.5 MCG/SPRAY nasal spray Place 2 sprays into the nose daily. Patient not taking: Reported on 06/08/2022  07/21/21   [name], FNP  nicotine (NICODERM CQ - DOSED IN MG/24 HOURS) 21 mg/24hr patch Place 1 patch (21 mg total) onto the skin daily. Patient not taking: Reported on 06/08/2022 04/21/22 04/21/23  [name], FNP  phentermine 15 MG capsule Take 1 capsule (15 mg total) by mouth every morning. Patient not taking: Reported on 06/08/2022 04/21/22   [name], FNP    Inpatient Medications: Scheduled Meds:  azithromycin  500 mg Oral Daily   buPROPion  150 mg Oral Daily   enoxaparin (LOVENOX) injection  80 mg Subcutaneous Q24H   furosemide  40 mg Intravenous Q12H   ipratropium-albuterol  3 mL Nebulization TID   mometasone-formoterol  2 puff Inhalation BID   montelukast  10 mg Oral QHS   predniSONE  40 mg Oral Q breakfast   Continuous Infusions:  PRN Meds: acetaminophen **OR** acetaminophen, albuterol, ondansetron **OR** ondansetron (ZOFRAN) IV  Allergies:    Allergies  Allergen Reactions   Penicillins Other (See Comments)    Childhood Allergy Did it involve swelling of the face/tongue/throat, SOB, or low BP? Tomasita Crumble  Did it involve sudden or severe rash/hives, skin peeling, or any reaction on the inside of your mouth or nose? UNK Did you need to seek medical attention at a hospital or doctor's office? UNK When did it last happen?      more than 10 years If all above answers are "NO", may proceed with cephalosporin use.    Pineapple Other (See Comments)    Tested allergic to this    Social History:   Social History   Socioeconomic History   Marital status: Single    Spouse name: Not on file   Number of children: Not on file   Years of education: Not on file   Highest education level: Not on file  Occupational History   Occupation: customer service rep  Tobacco Use   Smoking status: Light Smoker    Packs/day: 0.25    Years: 15.00    Total pack years: 3.75    Types: Cigarettes    Last attempt to quit: 09/16/2014    Years since quitting: 7.7   Smokeless tobacco:  Never   Tobacco comments:    she continues to smoke 1-2 cigarettes a day  Vaping Use   Vaping Use: Never used  Substance and Sexual Activity   Alcohol use: Yes    Alcohol/week: 0.0 standard drinks of alcohol    Comment: occas.   Drug use: Yes    Types: Marijuana    Comment: occas.   Sexual activity: Not Currently  Other Topics Concern   Not on file  Social History Narrative   DIET: regular      DO YOU DRINK/EAT THINGS WITH CAFFEINE: Yes      MARITAL STATUS: Single      WHAT YEAR WERE YOU MARRIED:       DO YOU LIVE IN A HOUSE, APARTMENT, ASSISTED LIVING, CONDO TRAILER ETC.:  House       IS IT ONE OR MORE STORIES: 1      HOW MANY PERSONS LIVE IN YOUR HOME: 3      DO YOU HAVE PETS IN YOUR HOME: No      CURRENT OR PAST PROFESSION: customer Service Rep      DO YOU EXERCISE: very little      WHAT TYPE AND HOW OFTEN:   Social Determinants of Health   Financial Resource Strain: Not on file  Food Insecurity: No Food Insecurity (06/08/2022)   Hunger Vital Sign    Worried About Running Out of Food in the Last Year: Never true    Ran Out of Food in the Last Year: Never true  Transportation Needs: No Transportation Needs (06/08/2022)   PRAPARE - Hydrologist (Medical): No    Lack of Transportation (Non-Medical): No  Physical Activity: Not on file  Stress: Not on file  Social Connections: Not on file  Intimate Partner Violence: Not At Risk (06/08/2022)   Humiliation, Afraid, Rape, and Kick questionnaire    Fear of Current or Ex-Partner: No    Emotionally Abused: No    Physically Abused: No    Sexually Abused: No    Family History:    Family History  Problem Relation Age of Onset   Diabetes Mother    Cancer Mother    Diabetes Sister    Hypertension Sister    Hypertension Sister    Urticaria Neg Hx    Immunodeficiency Neg Hx    Eczema Neg Hx    Asthma Neg Hx  Angioedema Neg Hx    Allergic rhinitis Neg Hx      ROS:  Please see the  history of present illness.   All other ROS reviewed and negative.     Physical Exam/Data:   Vitals:   06/09/22 2203 06/10/22 0500 06/10/22 0610 06/10/22 0856  BP: 130/82  (!) 152/91   Pulse: 73  74   Resp: 20  16   Temp: (!) 97.4 F (36.3 C)  (!) 97.4 F (36.3 C)   TempSrc: Oral  Oral   SpO2:   93% 95%  Weight:  (!) 154.8 kg    Height:        Intake/Output Summary (Last 24 hours) at 06/10/2022 0918 Last data filed at 06/10/2022 0600 Gross per 24 hour  Intake 480 ml  Output 2350 ml  Net -1870 ml      06/10/2022    5:00 AM 06/09/2022    5:00 AM 06/08/2022    5:38 PM  Last 3 Weights  Weight (lbs) 341 lb 3.2 oz 342 lb 2.5 oz 342 lb 1.6 oz  Weight (kg) 154.767 kg 155.2 kg 155.176 kg     Body mass index is 64.47 kg/m.  General:  Well nourished, well developed, in no acute distress. Sitting comfortably on the side of the bed  HEENT: normal Neck: no JVD Vascular: Radial pulses 2+ bilaterally Cardiac:  normal S1, S2; RRR; no murmur  Lungs: Distant lung sounds throughout. clear to auscultation bilaterally, no wheezing, rhonchi or rales. Normal WOB on room air  Abd: soft, nontender, no hepatomegaly  Ext: 2+ edema to the knees in BLE  Musculoskeletal:  No deformities, BUE and BLE strength normal and equal Skin: warm and dry  Neuro:  CNs 2-12 intact, no focal abnormalities noted Psych:  Normal affect   EKG:  The EKG was personally reviewed and demonstrates: Sinus tachycardia with a heart rate of 133 bpm, probable right atrial enlargement Telemetry:  Telemetry was personally reviewed and demonstrates:  NA   Relevant CV Studies:   Echocardiogram 06/09/2022    1. Left ventricular ejection fraction, by estimation, is 35%. The left  ventricle has moderately decreased function. The left ventricle  demonstrates global hypokinesis. The left ventricular internal cavity size  was mildly dilated. Left ventricular  diastolic parameters are consistent with Grade II diastolic  dysfunction  (pseudonormalization).   2. Right ventricular systolic function is normal. The right ventricular  size is normal. There is mildly elevated pulmonary artery systolic  pressure. The estimated right ventricular systolic pressure is 40.8 mmHg.   3. Left atrial size was mildly dilated.   4. Right atrial size was mildly dilated.   5. The mitral valve is normal in structure. Trivial mitral valve  regurgitation. No evidence of mitral stenosis.   6. The aortic valve is tricuspid. Aortic valve regurgitation is not  visualized. No aortic stenosis is present.   7. The inferior vena cava is dilated in size with <50% respiratory  variability, suggesting right atrial pressure of 15 mmHg.   Laboratory Data:  High Sensitivity Troponin:   Recent Labs  Lab 06/08/22 1345 06/08/22 1749 06/09/22 1556  TROPONINIHS 6 18* 4     Chemistry Recent Labs  Lab 06/08/22 1345 06/09/22 0825 06/10/22 0522  NA 138 139 137  K 3.6 4.3 4.4  CL 105 102 103  CO2 '28 29 30  '$ GLUCOSE 131* 114* 121*  BUN '7 11 18  '$ CREATININE 0.67 0.67 0.73  CALCIUM 8.5* 9.0 9.0  MG  --   --  2.1  GFRNONAA >60 >60 >60  ANIONGAP 5 8 4*    No results for input(s): "PROT", "ALBUMIN", "AST", "ALT", "ALKPHOS", "BILITOT" in the last 168 hours. Lipids No results for input(s): "CHOL", "TRIG", "HDL", "LABVLDL", "LDLCALC", "CHOLHDL" in the last 168 hours.  Hematology Recent Labs  Lab 06/08/22 1345 06/09/22 0519 06/10/22 0522  WBC 11.3* 8.5 11.5*  RBC 5.24* 5.14* 5.32*  HGB 15.6* 15.4* 15.9*  HCT 50.2* 49.6* 50.8*  MCV 95.8 96.5 95.5  MCH 29.8 30.0 29.9  MCHC 31.1 31.0 31.3  RDW 13.1 13.1 13.2  PLT 205 163 186   Thyroid No results for input(s): "TSH", "FREET4" in the last 168 hours.  BNP Recent Labs  Lab 06/08/22 1345  BNP 128.3*    DDimer No results for input(s): "DDIMER" in the last 168 hours.   Radiology/Studies:  ECHOCARDIOGRAM COMPLETE  Result Date: 06/09/2022    ECHOCARDIOGRAM REPORT   Patient Name:    HEYDI SWANGO [facility] Date of Exam: 06/09/2022 Medical Rec #:  [id number]       Height:       61.0 in Accession #:    [id number]      Weight:       342.2 lb Date of Birth:  May 20, 1976       BSA:          2.373 m Patient Age:    30 years        BP:           126/72 mmHg Patient Gender: F               HR:           78 bpm. Exam Location:  Inpatient Procedure: 2D Echo, Cardiac Doppler and Color Doppler Indications:    CHF  History:        Patient has prior history of Echocardiogram examinations, most                 recent 05/13/2014. COPD.  Sonographer:    Jefferey Pica Referring Phys: [id number] CHRISTOPHER P DANFORD IMPRESSIONS  1. Left ventricular ejection fraction, by estimation, is 35%. The left ventricle has moderately decreased function. The left ventricle demonstrates global hypokinesis. The left ventricular internal cavity size was mildly dilated. Left ventricular diastolic parameters are consistent with Grade II diastolic dysfunction (pseudonormalization).  2. Right ventricular systolic function is normal. The right ventricular size is normal. There is mildly elevated pulmonary artery systolic pressure. The estimated right ventricular systolic pressure is 01.0 mmHg.  3. Left atrial size was mildly dilated.  4. Right atrial size was mildly dilated.  5. The mitral valve is normal in structure. Trivial mitral valve regurgitation. No evidence of mitral stenosis.  6. The aortic valve is tricuspid. Aortic valve regurgitation is not visualized. No aortic stenosis is present.  7. The inferior vena cava is dilated in size with <50% respiratory variability, suggesting right atrial pressure of 15 mmHg. FINDINGS  Left Ventricle: Left ventricular ejection fraction, by estimation, is 35%. The left ventricle has moderately decreased function. The left ventricle demonstrates global hypokinesis. The left ventricular internal cavity size was mildly dilated. There is no left ventricular hypertrophy. Left ventricular diastolic  parameters are consistent with Grade II diastolic dysfunction (pseudonormalization). Right Ventricle: The right ventricular size is normal. No increase in right ventricular wall thickness. Right ventricular systolic function is normal. There is mildly elevated pulmonary artery systolic pressure. The tricuspid regurgitant velocity is 2.67  m/s, and with an assumed right  atrial pressure of 15 mmHg, the estimated right ventricular systolic pressure is 57.3 mmHg. Left Atrium: Left atrial size was mildly dilated. Right Atrium: Right atrial size was mildly dilated. Pericardium: There is no evidence of pericardial effusion. Mitral Valve: The mitral valve is normal in structure. Trivial mitral valve regurgitation. No evidence of mitral valve stenosis. Tricuspid Valve: The tricuspid valve is normal in structure. Tricuspid valve regurgitation is trivial. Aortic Valve: The aortic valve is tricuspid. Aortic valve regurgitation is not visualized. No aortic stenosis is present. Aortic valve peak gradient measures 11.5 mmHg. Pulmonic Valve: The pulmonic valve was normal in structure. Pulmonic valve regurgitation is trivial. Aorta: The aortic root is normal in size and structure. Venous: The inferior vena cava is dilated in size with less than 50% respiratory variability, suggesting right atrial pressure of 15 mmHg. IAS/Shunts: No atrial level shunt detected by color flow Doppler.  LEFT VENTRICLE PLAX 2D LVIDd:         6.35 cm   Diastology LVIDs:         4.80 cm   LV e' medial:    6.06 cm/s LV PW:         1.15 cm   LV E/e' medial:  13.1 LV IVS:        1.05 cm   LV e' lateral:   6.75 cm/s LVOT diam:     2.00 cm   LV E/e' lateral: 11.7 LV SV:         92 LV SV Index:   39 LVOT Area:     3.14 cm  IVC IVC diam: 3.20 cm LEFT ATRIUM             Index        RIGHT ATRIUM           Index LA diam:        3.10 cm 1.31 cm/m   RA Area:     21.20 cm LA Vol (A2C):   63.6 ml 26.80 ml/m  RA Volume:   67.10 ml  28.28 ml/m LA Vol (A4C):   59.9  ml 25.24 ml/m LA Biplane Vol: 65.0 ml 27.39 ml/m  AORTIC VALVE                 PULMONIC VALVE AV Area (Vmax): 2.63 cm     PV Vmax:       0.93 m/s AV Vmax:        169.50 cm/s  PV Peak grad:  3.5 mmHg AV Peak Grad:   11.5 mmHg LVOT Vmax:      142.00 cm/s LVOT Vmean:     96.000 cm/s LVOT VTI:       0.294 m  AORTA Ao Root diam: 2.90 cm Ao Asc diam:  3.20 cm MITRAL VALVE               TRICUSPID VALVE MV Area (PHT): 3.50 cm    TR Peak grad:   28.5 mmHg MV Decel Time: 217 msec    TR Vmax:        267.00 cm/s MV E velocity: 79.10 cm/s MV A velocity: 70.50 cm/s  SHUNTS MV E/A ratio:  1.12        Systemic VTI:  0.29 m                            Systemic Diam: 2.00 cm Dalton McleanMD Electronically signed by Franki Monte Signature Date/Time: 06/09/2022/3:15:54 PM  Final    DG Chest Portable 1 View  Result Date: 06/08/2022 CLINICAL DATA:  Shortness of breath EXAM: PORTABLE CHEST 1 VIEW COMPARISON:  05/01/2022 FINDINGS: Overlying artifact. Heart and mediastinal shadows appear normal. The lungs are clear allowing for the overlying artifact. No visible pleural fluid. IMPRESSION: Overlying artifact.  No active disease identified. Electronically Signed   By: Nelson Chimes M.D.   On: 06/08/2022 14:17     Assessment and Plan:   Acute on Chronic Combined Systolic and Diastolic Heart Failure  - Patient complained of having progressive dyspnea and ankle edema for several weeks. She was planning to have an outpatient cardiology appointment, but she came to the hospital prior  - Echo this admission showed EF 35%, grade II diastolic dysfunction, normal RV systolic function. Note her EF was 55-60% in 2015 - CXR without active disease  - BNP mildly elevated to 128.3. Note, this could be elevated due to patient's obesity (BMI 64.47)  - Patient has a past medical history of obesity and has been smoking cigarettes for over 20 years.  Given her newly reduced EF, I believe she would benefit from ischemic evaluation at this  time. Not a candidate for coronary CT due to weight  - Plan for right/left heart catheterization tomorrow. - Start GDMT as tolerated.  - Today, start low dose entresto. If BP tolerates, can try to add a BB tomorrow (likely toprol-XL given asthma)  - Start jardiance 10 mg daily  - Ankle edema has been improving with IV lasix 40 mg BID. Output 1.6 L urine yesterday. Renal function stable. Continue current lasix dose   Shared Decision Making/Informed Consent The risks [stroke (1 in 1000), death (1 in 1000), kidney failure [usually temporary] (1 in 500), bleeding (1 in 200), allergic reaction [possibly serious] (1 in 200)], benefits (diagnostic support and management of coronary artery disease) and alternatives of a cardiac catheterization were discussed in detail with Ms. Meester and she is willing to proceed.   Troponin Elevation  - hsTn 6>>18>>4  - Denies chest pain  - Very minimally elevated to 18, but trended back down to 4. Likely due to acute respiratory failure   Tobacco Use  - Discussed tobacco cessation  - Patient is working with PCP to quit   Otherwise Per primary  - Asthma exacerbation  - Depression    Risk Assessment/Risk Scores:    New York Heart Association (NYHA) Functional Class NYHA Class III        For questions or updates, please contact [place] Please consult [email or web address] for contact info under    Signed, Margie Billet, PA-C  06/10/2022 9:18 AM

## 2022-06-10 NOTE — Plan of Care (Signed)
  Problem: Education: Goal: Knowledge of General Education information will improve Description: Including pain rating scale, medication(s)/side effects and non-pharmacologic comfort measures Outcome: Completed/Met   Problem: Clinical Measurements: Goal: Diagnostic test results will improve Outcome: Progressing Goal: Respiratory complications will improve Outcome: Progressing Goal: Cardiovascular complication will be avoided Outcome: Progressing   

## 2022-06-10 NOTE — Progress Notes (Signed)
PROGRESS NOTE  April Ellis KDT:[id number] DOB: August 25, 1976 DOA: 06/08/2022 PCP: [name], FNP   LOS: 2 days   Brief Narrative / Interim history: 46 year old female with asthma/COPD overlap syndrome, not on home oxygen, ongoing tobacco use, obesity, here with acute onset of chest tightness associated with shortness of breath and wheezing.  Chest x-ray on admission without obvious infiltrates.  Initially required 6 L nasal cannula, was given steroids, nebulizers, antibiotics and was admitted to the hospital  Significant events: 9/12-admit  Significant imaging / results / micro data: Chest x-ray 9/12-no active disease identified 2D echo 9/13-LVEF 35%, global hypokinesis, LV was dilated.  Grade 2 diastolic dysfunction present.  RV was normal, there is mild elevation of the PA pressure to 43.5.  Subjective / 24h Interval events: Denies any significant shortness of breath.  No chest pains overnight.  Assesement and Plan: Principal Problem:   Acute respiratory failure with hypoxia (HCC) Active Problems:   Asthma, chronic, unspecified asthma severity, with acute exacerbation   Asthma-COPD overlap syndrome (HCC)   Class 3 severe obesity due to excess calories without serious comorbidity with body mass index (BMI) of 60.0 to 69.9 in adult [facility])   Tobacco abuse   Leg swelling   Principal problem Acute hypoxic respiratory failure due to asthma exacerbation, possibly a component of acute systolic CHF-improved today however persistently hypoxic into the mid 80s, and gets dyspneic with ambulation.  Continue supplemental oxygen, wean off as tolerated.  Continue steroids, azithromycin, nebulizers  Active problems Acute combined systolic and diastolic CHF-possibly acute, but not sure.  She has been having some progressive dyspnea for several weeks now and actually had a cardiology appointment on 9/14.  2D echo done shows an EF of 99%, grade 2 diastolic dysfunction.  This is new, her prior EF  was normal few years back.  Cardiology consulted.  She was on phentermine for about a month mid July-mid August, do not think it can cause depressed EF but defer to cardiology.  -Mains slightly fluid overloaded mainly in her lower extremities, continue IV Lasix -Cardiology plans left and right heart cath tomorrow for ischemic evaluation  Troponin elevation-mild, overall flat, likely in the setting of #1.  Probably demand ischemia.    Tobacco use-she is working with her PCP regarding cessation  Obesity, morbid-BMI 64.  She tells me that over the last few years she might have lost close to 100 pounds.  Actively working with her PCP regarding weight loss  Depression-continue Wellbutrin.   Scheduled Meds:  azithromycin  500 mg Oral Daily   buPROPion  150 mg Oral Daily   empagliflozin  10 mg Oral Daily   enoxaparin (LOVENOX) injection  80 mg Subcutaneous Q24H   furosemide  40 mg Intravenous Q12H   ipratropium-albuterol  3 mL Nebulization TID   mometasone-formoterol  2 puff Inhalation BID   montelukast  10 mg Oral QHS   predniSONE  40 mg Oral Q breakfast   sacubitril-valsartan  1 tablet Oral BID   sodium chloride flush  3 mL Intravenous Q12H   Continuous Infusions: PRN Meds:.acetaminophen **OR** acetaminophen, albuterol, ondansetron **OR** ondansetron (ZOFRAN) IV  Current Outpatient Medications  Medication Instructions   albuterol (VENTOLIN HFA) 108 (90 Base) MCG/ACT inhaler 2 puffs, Inhalation, Every 4 hours PRN   budesonide (PULMICORT) 1 mg, Nebulization, 4 times daily PRN   budesonide-formoterol (SYMBICORT) 160-4.5 MCG/ACT inhaler Take 2 puffs twice daily   buPROPion (WELLBUTRIN XL) 150 MG 24 hr tablet TAKE 1 TABLET BY MOUTH EVERY DAY  IN THE MORNING   cetirizine (ZYRTEC) 10 mg, Oral, Daily PRN   EPINEPHrine (EPI-PEN) 0.3 mg, Intramuscular, As needed   fluticasone (FLONASE SENSIMIST) 27.5 MCG/SPRAY nasal spray 2 sprays, Nasal, Daily   ipratropium-albuterol (DUONEB) 0.5-2.5 (3)  MG/3ML SOLN INHALE 1 VIAL BY MOUTH VIA NEBULIZER EVERY 6 HOURS AS NEEDED   montelukast (SINGULAIR) 10 mg, Oral, Daily at bedtime, TAKE 1 TABLET BY MOUTH EVERYDAY AT BEDTIME<BR>Strength: 10 mg   nicotine (NICODERM CQ - DOSED IN MG/24 HOURS) 21 mg, Transdermal, Every 24 hours   nicotine polacrilex (NICORETTE) 4 mg, Oral, As needed   ondansetron (ZOFRAN) 4 mg, Oral, Every 6 hours   phentermine 15 mg, Oral, BH-each morning    Diet Orders (From admission, onward)     Start     Ordered   06/08/22 1621  Diet regular Room service appropriate? Yes; Fluid consistency: Thin  Diet effective now       Question Answer Comment  Room service appropriate? Yes   Fluid consistency: Thin      06/08/22 1620            DVT prophylaxis:   Lovenox   Lab Results  Component Value Date   PLT 186 06/10/2022      Code Status: Full Code  Family Communication: No family at bedside  Status is: Inpatient Remains inpatient appropriate because: Persistently hypoxic, new onset CHFrEF   Level of care: Progressive  Consultants:  Cardiology  Objective: Vitals:   06/09/22 2203 06/10/22 0500 06/10/22 0610 06/10/22 0856  BP: 130/82  (!) 152/91   Pulse: 73  74   Resp: 20  16   Temp: (!) 97.4 F (36.3 C)  (!) 97.4 F (36.3 C)   TempSrc: Oral  Oral   SpO2:   93% 95%  Weight:  (!) 154.8 kg    Height:        Intake/Output Summary (Last 24 hours) at 06/10/2022 1100 Last data filed at 06/10/2022 0600 Gross per 24 hour  Intake 480 ml  Output 2350 ml  Net -1870 ml    Wt Readings from Last 3 Encounters:  06/10/22 (!) 154.8 kg  05/01/22 (!) 156.5 kg  04/21/22 (!) 156.5 kg    Examination:  Constitutional: NAD Eyes: lids and conjunctivae normal, no scleral icterus ENMT: mmm Neck: normal, supple Respiratory: clear to auscultation bilaterally, no wheezing, no crackles. Normal respiratory effort.  Cardiovascular: Regular rate and rhythm, no murmurs / rubs / gallops.  1+ pitting edema Abdomen:  soft, no distention, no tenderness. Bowel sounds positive.  Skin: no rashes Neurologic: no focal deficits, equal strength  Data Reviewed: I have independently reviewed following labs and imaging studies   CBC Recent Labs  Lab 06/08/22 1345 06/09/22 0519 06/10/22 0522  WBC 11.3* 8.5 11.5*  HGB 15.6* 15.4* 15.9*  HCT 50.2* 49.6* 50.8*  PLT 205 163 186  MCV 95.8 96.5 95.5  MCH 29.8 30.0 29.9  MCHC 31.1 31.0 31.3  RDW 13.1 13.1 13.2     Recent Labs  Lab 06/08/22 1345 06/09/22 0825 06/10/22 0522  NA 138 139 137  K 3.6 4.3 4.4  CL 105 102 103  CO2 '28 29 30  '$ GLUCOSE 131* 114* 121*  BUN '7 11 18  '$ CREATININE 0.67 0.67 0.73  CALCIUM 8.5* 9.0 9.0  MG  --   --  2.1  BNP 128.3*  --   --      ------------------------------------------------------------------------------------------------------------------ No results for input(s): "CHOL", "HDL", "LDLCALC", "TRIG", "CHOLHDL", "LDLDIRECT" in the  last 72 hours.  Lab Results  Component Value Date   HGBA1C 5.0 04/21/2022   ------------------------------------------------------------------------------------------------------------------ No results for input(s): "TSH", "T4TOTAL", "T3FREE", "THYROIDAB" in the last 72 hours.  Invalid input(s): "FREET3"  Cardiac Enzymes No results for input(s): "CKMB", "TROPONINI", "MYOGLOBIN" in the last 168 hours.  Invalid input(s): "CK" ------------------------------------------------------------------------------------------------------------------    Component Value Date/Time   BNP 128.3 (H) 06/08/2022 1345    CBG: No results for input(s): "GLUCAP" in the last 168 hours.  Recent Results (from the past 240 hour(s))  SARS Coronavirus 2 by RT PCR (hospital order, performed in [facility] hospital lab) *cepheid single result test* Anterior Nasal Swab     Status: None   Collection Time: 06/08/22  1:49 PM   Specimen: Anterior Nasal Swab  Result Value Ref Range Status   SARS Coronavirus 2  by RT PCR NEGATIVE NEGATIVE Final    Comment: (NOTE) SARS-CoV-2 target nucleic acids are NOT DETECTED.  The SARS-CoV-2 RNA is generally detectable in upper and lower respiratory specimens during the acute phase of infection. The lowest concentration of SARS-CoV-2 viral copies this assay can detect is 250 copies / mL. A negative result does not preclude SARS-CoV-2 infection and should not be used as the sole basis for treatment or other patient management decisions.  A negative result may occur with improper specimen collection / handling, submission of specimen other than nasopharyngeal swab, presence of viral mutation(s) within the areas targeted by this assay, and inadequate number of viral copies (<250 copies / mL). A negative result must be combined with clinical observations, patient history, and epidemiological information.  Fact Sheet for Patients:   https://www.patel.info/  Fact Sheet for Healthcare Providers: https://hall.com/  This test is not yet approved or  cleared by the [place] FDA and has been authorized for detection and/or diagnosis of SARS-CoV-2 by FDA under an Emergency Use Authorization (EUA).  This EUA will remain in effect (meaning this test can be used) for the duration of the COVID-19 declaration under Section 564(b)(1) of the Act, 21 U.S.C. section 360bbb-3(b)(1), unless the authorization is terminated or revoked sooner.  Performed at [facility], [place] [address], [place], [place] [address]   Respiratory (~20 pathogens) panel by PCR     Status: Abnormal   Collection Time: 06/08/22  1:49 PM   Specimen: Nasopharyngeal Swab; Respiratory  Result Value Ref Range Status   Adenovirus NOT DETECTED NOT DETECTED Final   Coronavirus 229E NOT DETECTED NOT DETECTED Final    Comment: (NOTE) The Coronavirus on the Respiratory Panel, DOES NOT test for the novel  Coronavirus (2019 nCoV)     Coronavirus HKU1 NOT DETECTED NOT DETECTED Final   Coronavirus NL63 NOT DETECTED NOT DETECTED Final   Coronavirus OC43 NOT DETECTED NOT DETECTED Final   Metapneumovirus NOT DETECTED NOT DETECTED Final   Rhinovirus / Enterovirus DETECTED (A) NOT DETECTED Final   Influenza A NOT DETECTED NOT DETECTED Final   Influenza B NOT DETECTED NOT DETECTED Final   Parainfluenza Virus 1 NOT DETECTED NOT DETECTED Final   Parainfluenza Virus 2 NOT DETECTED NOT DETECTED Final   Parainfluenza Virus 3 NOT DETECTED NOT DETECTED Final   Parainfluenza Virus 4 NOT DETECTED NOT DETECTED Final   Respiratory Syncytial Virus NOT DETECTED NOT DETECTED Final   Bordetella pertussis NOT DETECTED NOT DETECTED Final   Bordetella Parapertussis NOT DETECTED NOT DETECTED Final   Chlamydophila pneumoniae NOT DETECTED NOT DETECTED Final   Mycoplasma pneumoniae NOT DETECTED NOT DETECTED Final  Comment: Performed at [facility], [place] [address], [place], [place] [address]     Radiology Studies: ECHOCARDIOGRAM COMPLETE  Result Date: 06/09/2022    ECHOCARDIOGRAM REPORT   Patient Name:   EMIYA LOOMER [facility] Date of Exam: 06/09/2022 Medical Rec #:  [id number]       Height:       61.0 in Accession #:    [id number]      Weight:       342.2 lb Date of Birth:  April 19, 1976       BSA:          2.373 m Patient Age:    17 years        BP:           126/72 mmHg Patient Gender: F               HR:           78 bpm. Exam Location:  Inpatient Procedure: 2D Echo, Cardiac Doppler and Color Doppler Indications:    CHF  History:        Patient has prior history of Echocardiogram examinations, most                 recent 05/13/2014. COPD.  Sonographer:    Jefferey Pica Referring Phys: [id number] CHRISTOPHER P DANFORD IMPRESSIONS  1. Left ventricular ejection fraction, by estimation, is 35%. The left ventricle has moderately decreased function. The left ventricle demonstrates global hypokinesis. The left ventricular internal cavity size was mildly  dilated. Left ventricular diastolic parameters are consistent with Grade II diastolic dysfunction (pseudonormalization).  2. Right ventricular systolic function is normal. The right ventricular size is normal. There is mildly elevated pulmonary artery systolic pressure. The estimated right ventricular systolic pressure is 80.3 mmHg.  3. Left atrial size was mildly dilated.  4. Right atrial size was mildly dilated.  5. The mitral valve is normal in structure. Trivial mitral valve regurgitation. No evidence of mitral stenosis.  6. The aortic valve is tricuspid. Aortic valve regurgitation is not visualized. No aortic stenosis is present.  7. The inferior vena cava is dilated in size with <50% respiratory variability, suggesting right atrial pressure of 15 mmHg. FINDINGS  Left Ventricle: Left ventricular ejection fraction, by estimation, is 35%. The left ventricle has moderately decreased function. The left ventricle demonstrates global hypokinesis. The left ventricular internal cavity size was mildly dilated. There is no left ventricular hypertrophy. Left ventricular diastolic parameters are consistent with Grade II diastolic dysfunction (pseudonormalization). Right Ventricle: The right ventricular size is normal. No increase in right ventricular wall thickness. Right ventricular systolic function is normal. There is mildly elevated pulmonary artery systolic pressure. The tricuspid regurgitant velocity is 2.67  m/s, and with an assumed right atrial pressure of 15 mmHg, the estimated right ventricular systolic pressure is 21.2 mmHg. Left Atrium: Left atrial size was mildly dilated. Right Atrium: Right atrial size was mildly dilated. Pericardium: There is no evidence of pericardial effusion. Mitral Valve: The mitral valve is normal in structure. Trivial mitral valve regurgitation. No evidence of mitral valve stenosis. Tricuspid Valve: The tricuspid valve is normal in structure. Tricuspid valve regurgitation is trivial.  Aortic Valve: The aortic valve is tricuspid. Aortic valve regurgitation is not visualized. No aortic stenosis is present. Aortic valve peak gradient measures 11.5 mmHg. Pulmonic Valve: The pulmonic valve was normal in structure. Pulmonic valve regurgitation is trivial. Aorta: The aortic root is normal in size and structure. Venous: The inferior vena cava  is dilated in size with less than 50% respiratory variability, suggesting right atrial pressure of 15 mmHg. IAS/Shunts: No atrial level shunt detected by color flow Doppler.  LEFT VENTRICLE PLAX 2D LVIDd:         6.35 cm   Diastology LVIDs:         4.80 cm   LV e' medial:    6.06 cm/s LV PW:         1.15 cm   LV E/e' medial:  13.1 LV IVS:        1.05 cm   LV e' lateral:   6.75 cm/s LVOT diam:     2.00 cm   LV E/e' lateral: 11.7 LV SV:         92 LV SV Index:   39 LVOT Area:     3.14 cm  IVC IVC diam: 3.20 cm LEFT ATRIUM             Index        RIGHT ATRIUM           Index LA diam:        3.10 cm 1.31 cm/m   RA Area:     21.20 cm LA Vol (A2C):   63.6 ml 26.80 ml/m  RA Volume:   67.10 ml  28.28 ml/m LA Vol (A4C):   59.9 ml 25.24 ml/m LA Biplane Vol: 65.0 ml 27.39 ml/m  AORTIC VALVE                 PULMONIC VALVE AV Area (Vmax): 2.63 cm     PV Vmax:       0.93 m/s AV Vmax:        169.50 cm/s  PV Peak grad:  3.5 mmHg AV Peak Grad:   11.5 mmHg LVOT Vmax:      142.00 cm/s LVOT Vmean:     96.000 cm/s LVOT VTI:       0.294 m  AORTA Ao Root diam: 2.90 cm Ao Asc diam:  3.20 cm MITRAL VALVE               TRICUSPID VALVE MV Area (PHT): 3.50 cm    TR Peak grad:   28.5 mmHg MV Decel Time: 217 msec    TR Vmax:        267.00 cm/s MV E velocity: 79.10 cm/s MV A velocity: 70.50 cm/s  SHUNTS MV E/A ratio:  1.12        Systemic VTI:  0.29 m                            Systemic Diam: 2.00 cm Dalton McleanMD Electronically signed by Franki Monte Signature Date/Time: 06/09/2022/3:15:54 PM    Final      Marzetta Board, MD, PhD [facility]  Between 7 am - 7 pm I am  available, please contact me via Amion (for emergencies) or Securechat (non urgent messages)  Between 7 pm - 7 am I am not available, please contact night coverage MD/APP via Amion

## 2022-06-11 ENCOUNTER — Encounter (HOSPITAL_COMMUNITY)
Admission: EM | Disposition: A | Discharge status: Home / Self Care | Payer: Self-pay | Source: Home / Self Care | Attending: Internal Medicine

## 2022-06-11 DIAGNOSIS — I5021 Acute systolic (congestive) heart failure: Secondary | ICD-10-CM | POA: Diagnosis not present

## 2022-06-11 HISTORY — PX: RIGHT/LEFT HEART CATH AND CORONARY ANGIOGRAPHY: CATH118266

## 2022-06-11 LAB — POCT I-STAT 7, (LYTES, BLD GAS, ICA,H+H)
Acid-Base Excess: 8 mmol/L — ABNORMAL HIGH (ref 0.0–2.0)
Acid-Base Excess: 9 mmol/L — ABNORMAL HIGH (ref 0.0–2.0)
Bicarbonate: 34.4 mmol/L — ABNORMAL HIGH (ref 20.0–28.0)
Bicarbonate: 36 mmol/L — ABNORMAL HIGH (ref 20.0–28.0)
Calcium, Ion: 1.08 mmol/L — ABNORMAL LOW (ref 1.15–1.40)
Calcium, Ion: 1.15 mmol/L (ref 1.15–1.40)
HCT: 50 % — ABNORMAL HIGH (ref 36.0–46.0)
HCT: 52 % — ABNORMAL HIGH (ref 36.0–46.0)
Hemoglobin: 17 g/dL — ABNORMAL HIGH (ref 12.0–15.0)
Hemoglobin: 17.7 g/dL — ABNORMAL HIGH (ref 12.0–15.0)
O2 Saturation: 88 %
O2 Saturation: 89 %
Potassium: 3.7 mmol/L (ref 3.5–5.1)
Potassium: 3.9 mmol/L (ref 3.5–5.1)
Sodium: 138 mmol/L (ref 135–145)
Sodium: 139 mmol/L (ref 135–145)
TCO2: 36 mmol/L — ABNORMAL HIGH (ref 22–32)
TCO2: 38 mmol/L — ABNORMAL HIGH (ref 22–32)
pCO2 arterial: 52.7 mmHg — ABNORMAL HIGH (ref 32–48)
pCO2 arterial: 58.2 mmHg — ABNORMAL HIGH (ref 32–48)
pH, Arterial: 7.4 (ref 7.35–7.45)
pH, Arterial: 7.422 (ref 7.35–7.45)
pO2, Arterial: 54 mmHg — ABNORMAL LOW (ref 83–108)
pO2, Arterial: 58 mmHg — ABNORMAL LOW (ref 83–108)

## 2022-06-11 LAB — POCT I-STAT EG7
Acid-Base Excess: 9 mmol/L — ABNORMAL HIGH (ref 0.0–2.0)
Acid-Base Excess: 8 mmol/L — ABNORMAL HIGH (ref 0.0–2.0)
Bicarbonate: 36.9 mmol/L — ABNORMAL HIGH (ref 20.0–28.0)
Bicarbonate: 35.4 mmol/L — ABNORMAL HIGH (ref 20.0–28.0)
Calcium, Ion: 1.15 mmol/L (ref 1.15–1.40)
Calcium, Ion: 1.06 mmol/L — ABNORMAL LOW (ref 1.15–1.40)
HCT: 52 % — ABNORMAL HIGH (ref 36.0–46.0)
HCT: 51 % — ABNORMAL HIGH (ref 36.0–46.0)
Hemoglobin: 17.7 g/dL — ABNORMAL HIGH (ref 12.0–15.0)
Hemoglobin: 17.3 g/dL — ABNORMAL HIGH (ref 12.0–15.0)
O2 Saturation: 74 %
O2 Saturation: 72 %
Potassium: 3.9 mmol/L (ref 3.5–5.1)
Potassium: 3.7 mmol/L (ref 3.5–5.1)
Sodium: 139 mmol/L (ref 135–145)
Sodium: 141 mmol/L (ref 135–145)
TCO2: 39 mmol/L — ABNORMAL HIGH (ref 22–32)
TCO2: 37 mmol/L — ABNORMAL HIGH (ref 22–32)
pCO2, Ven: 59.3 mmHg (ref 44–60)
pCO2, Ven: 58 mmHg (ref 44–60)
pH, Ven: 7.403 (ref 7.25–7.43)
pH, Ven: 7.394 (ref 7.25–7.43)
pO2, Ven: 41 mmHg (ref 32–45)
pO2, Ven: 39 mmHg (ref 32–45)

## 2022-06-11 LAB — CBC
HCT: 50.5 % — ABNORMAL HIGH (ref 36.0–46.0)
Hemoglobin: 15.6 g/dL — ABNORMAL HIGH (ref 12.0–15.0)
MCH: 29.6 pg (ref 26.0–34.0)
MCHC: 30.9 g/dL (ref 30.0–36.0)
MCV: 95.8 fL (ref 80.0–100.0)
Platelets: 186 10*3/uL (ref 150–400)
RBC: 5.27 MIL/uL — ABNORMAL HIGH (ref 3.87–5.11)
RDW: 13.2 % (ref 11.5–15.5)
WBC: 10.9 10*3/uL — ABNORMAL HIGH (ref 4.0–10.5)
nRBC: 0 % (ref 0.0–0.2)

## 2022-06-11 LAB — COMPREHENSIVE METABOLIC PANEL
ALT: 15 U/L (ref 0–44)
AST: 14 U/L — ABNORMAL LOW (ref 15–41)
Albumin: 3.3 g/dL — ABNORMAL LOW (ref 3.5–5.0)
Alkaline Phosphatase: 64 U/L (ref 38–126)
Anion gap: 6 (ref 5–15)
BUN: 20 mg/dL (ref 6–20)
CO2: 34 mmol/L — ABNORMAL HIGH (ref 22–32)
Calcium: 8.6 mg/dL — ABNORMAL LOW (ref 8.9–10.3)
Chloride: 100 mmol/L (ref 98–111)
Creatinine, Ser: 0.75 mg/dL (ref 0.44–1.00)
GFR, Estimated: >60 mL/min (ref >60)
Glucose, Bld: 77 mg/dL (ref 70–99)
Potassium: 3.9 mmol/L (ref 3.5–5.1)
Sodium: 140 mmol/L (ref 135–145)
Total Bilirubin: 1.2 mg/dL (ref 0.3–1.2)
Total Protein: 6.5 g/dL (ref 6.5–8.1)

## 2022-06-11 LAB — MAGNESIUM: Magnesium: 2.2 mg/dL (ref 1.7–2.4)

## 2022-06-11 SURGERY — RIGHT/LEFT HEART CATH AND CORONARY ANGIOGRAPHY
Anesthesia: LOCAL

## 2022-06-11 MED ORDER — HYDRALAZINE HCL 20 MG/ML IJ SOLN: 10.0000 mg | INTRAMUSCULAR | Status: AC | PRN

## 2022-06-11 MED ORDER — HEPARIN (PORCINE) IN NACL 1000-0.9 UT/500ML-% IV SOLN
INTRAVENOUS | Status: AC
  Filled 2022-06-11: qty 1000

## 2022-06-11 MED ORDER — SODIUM CHLORIDE 0.9 % IV SOLN: INTRAVENOUS | Status: AC

## 2022-06-11 MED ORDER — SODIUM CHLORIDE 0.9 % IV SOLN: 250.0000 mL | INTRAVENOUS | Status: DC | PRN

## 2022-06-11 MED ORDER — IOHEXOL 350 MG/ML SOLN
INTRAVENOUS | Status: DC | PRN
  Administered 2022-06-11: 30 mL

## 2022-06-11 MED ORDER — FENTANYL CITRATE (PF) 100 MCG/2ML IJ SOLN
INTRAMUSCULAR | Status: AC
  Filled 2022-06-11: qty 2

## 2022-06-11 MED ORDER — ACETAMINOPHEN 325 MG PO TABS: 650.0000 mg | ORAL_TABLET | ORAL | Status: DC | PRN

## 2022-06-11 MED ORDER — MIDAZOLAM HCL 2 MG/2ML IJ SOLN
INTRAMUSCULAR | Status: DC | PRN
  Administered 2022-06-11: 1 mg via INTRAVENOUS

## 2022-06-11 MED ORDER — MIDAZOLAM HCL 2 MG/2ML IJ SOLN
INTRAMUSCULAR | Status: AC
  Filled 2022-06-11: qty 2

## 2022-06-11 MED ORDER — ONDANSETRON HCL 4 MG/2ML IJ SOLN: 4.0000 mg | Freq: Four times a day (QID) | INTRAMUSCULAR | Status: DC | PRN

## 2022-06-11 MED ORDER — HEPARIN SODIUM (PORCINE) 1000 UNIT/ML IJ SOLN
INTRAMUSCULAR | Status: AC
  Filled 2022-06-11: qty 10

## 2022-06-11 MED ORDER — LABETALOL HCL 5 MG/ML IV SOLN: 10.0000 mg | INTRAVENOUS | Status: AC | PRN

## 2022-06-11 MED ORDER — GUAIFENESIN-DM 100-10 MG/5ML PO SYRP
5.0000 mL | ORAL_SOLUTION | ORAL | Status: DC | PRN
  Administered 2022-06-11: 5 mL via ORAL
  Filled 2022-06-11: qty 10

## 2022-06-11 MED ORDER — LIDOCAINE HCL (PF) 1 % IJ SOLN
INTRAMUSCULAR | Status: DC | PRN
  Administered 2022-06-11 (×2): 2 mL

## 2022-06-11 MED ORDER — SODIUM CHLORIDE 0.9% FLUSH: 3.0000 mL | INTRAVENOUS | Status: DC | PRN

## 2022-06-11 MED ORDER — VERAPAMIL HCL 2.5 MG/ML IV SOLN
INTRAVENOUS | Status: AC
  Filled 2022-06-11: qty 2

## 2022-06-11 MED ORDER — VERAPAMIL HCL 2.5 MG/ML IV SOLN
INTRAVENOUS | Status: DC | PRN
  Administered 2022-06-11: 10 mL via INTRA_ARTERIAL

## 2022-06-11 MED ORDER — SODIUM CHLORIDE 0.9% FLUSH
3.0000 mL | Freq: Two times a day (BID) | INTRAVENOUS | Status: DC
  Administered 2022-06-11: 3 mL via INTRAVENOUS

## 2022-06-11 MED ORDER — FENTANYL CITRATE (PF) 100 MCG/2ML IJ SOLN
INTRAMUSCULAR | Status: DC | PRN
  Administered 2022-06-11: 25 ug via INTRAVENOUS

## 2022-06-11 MED ORDER — HEPARIN (PORCINE) IN NACL 1000-0.9 UT/500ML-% IV SOLN
INTRAVENOUS | Status: DC | PRN
  Administered 2022-06-11 (×2): 500 mL

## 2022-06-11 MED ORDER — HEPARIN SODIUM (PORCINE) 1000 UNIT/ML IJ SOLN
INTRAMUSCULAR | Status: DC | PRN
  Administered 2022-06-11: 5000 [IU] via INTRAVENOUS

## 2022-06-11 MED ORDER — LIDOCAINE HCL (PF) 1 % IJ SOLN
INTRAMUSCULAR | Status: AC
  Filled 2022-06-11: qty 30

## 2022-06-11 SURGICAL SUPPLY — 13 items
CATH BALLN WEDGE 5F 110CM (CATHETERS) IMPLANT
CATH OPTITORQUE TIG 4.0 6F (CATHETERS) IMPLANT
DEVICE RAD COMP TR BAND LRG (VASCULAR PRODUCTS) IMPLANT
GLIDESHEATH SLEND SS 6F .021 (SHEATH) IMPLANT
GUIDEWIRE INQWIRE 1.5J.035X260 (WIRE) IMPLANT
INQWIRE 1.5J .035X260CM (WIRE) ×1
KIT HEART LEFT (KITS) ×1 IMPLANT
PACK CARDIAC CATHETERIZATION (CUSTOM PROCEDURE TRAY) ×1 IMPLANT
SHEATH GLIDE SLENDER 4/5FR (SHEATH) IMPLANT
SHEATH PROBE COVER 6X72 (BAG) IMPLANT
SYR MEDRAD MARK 7 150ML (SYRINGE) ×1 IMPLANT
TRANSDUCER W/STOPCOCK (MISCELLANEOUS) ×1 IMPLANT
TUBING CIL FLEX 10 FLL-RA (TUBING) ×1 IMPLANT

## 2022-06-11 NOTE — Progress Notes (Signed)
Pt has order for 2 IV sites. 1st IV site was in place. Pt asked if the 2nd Site can be placed in the middle of the morning. RN asked the cardiac cath nurse and said it's fine. Will start IV infusion after the 2nd site is inserted. Pt also refused clipping on her R groin and R wrist. Reasons for clipping explained to patient.

## 2022-06-11 NOTE — Progress Notes (Signed)
TR BAND REMOVAL  LOCATION:    Right radial  DEFLATED PER PROTOCOL:    Yes.    TIME BAND OFF / DRESSING APPLIED:    1715p a clean dry dressing applied  with guaze and coban    SITE UPON ARRIVAL:    Level 0  SITE AFTER BAND REMOVAL:    Level 0  CIRCULATION SENSATION AND MOVEMENT:    Within Normal Limits   Yes.    COMMENTS:    Care instructions given to patient.

## 2022-06-11 NOTE — Interval H&P Note (Signed)
History and Physical Interval Note:  06/11/2022 2:17 PM  April Ellis  has presented today for surgery, with the diagnosis of acute systolic hf.  The various methods of treatment have been discussed with the patient and family. After consideration of risks, benefits and other options for treatment, the patient has consented to  Procedure(s): RIGHT/LEFT HEART CATH AND CORONARY ANGIOGRAPHY (N/A) as a surgical intervention.  The patient's history has been reviewed, patient examined, no change in status, stable for surgery.  I have reviewed the patient's chart and labs.  Questions were answered to the patient's satisfaction.     Early Osmond

## 2022-06-11 NOTE — Final Progress Note (Signed)
Pt alert and oriented to person place time and situation; pt denies pain at this time; pt vitals stable; pt had heart cath done today and no blockages found; pt up to bathroom independently with no complaints; pt on room air; pt has no concerns at this time

## 2022-06-11 NOTE — TOC Progression Note (Signed)
Transition of Care [facility]) - Progression Note    Patient Details  Name: YULISA CHIRICO MRN: [id number] Date of Birth: January 06, 1976  Transition of Care [facility]) CM/SW Contact  Servando Snare, [place] Phone Number: 06/11/2022, 9:23 AM  Clinical Narrative:      Transition of Care (TOC) Screening Note   Patient Details  Name: ALEJANDRO ADCOX Date of Birth: 11-Jul-1976   Transition of Care [facility]) CM/SW Contact:    Servando Snare, LCSW Phone Number: 06/11/2022, 9:23 AM    Transition of Care Department [facility]) has reviewed patient and no TOC needs have been identified at this time. We will continue to monitor patient advancement through interdisciplinary progression rounds. If new patient transition needs arise, please place a TOC consult.         Expected Discharge Plan and Services                                                 Social Determinants of Health (SDOH) Interventions    Readmission Risk Interventions     No data to display

## 2022-06-11 NOTE — Progress Notes (Signed)
PROGRESS NOTE  April Ellis BDZ:[id number] DOB: 02/01/1976 DOA: 06/08/2022 PCP: [name], FNP   LOS: 3 days   Brief Narrative / Interim history: 46 year old female with asthma/COPD overlap syndrome, not on home oxygen, ongoing tobacco use, obesity, here with acute onset of chest tightness associated with shortness of breath and wheezing.  Chest x-ray on admission without obvious infiltrates.  Initially required 6 L nasal cannula, was given steroids, nebulizers, antibiotics and was admitted to the hospital  Significant events: 9/12-admit  Significant imaging / results / micro data: Chest x-ray 9/12-no active disease identified 2D echo 9/13-LVEF 35%, global hypokinesis, LV was dilated.  Grade 2 diastolic dysfunction present.  RV was normal, there is mild elevation of the PA pressure to 43.5.  Subjective / 24h Interval events: No chest pain, no shortness of breath.  Feels well  Assesement and Plan: Principal Problem:   Acute respiratory failure with hypoxia (HCC) Active Problems:   Asthma, chronic, unspecified asthma severity, with acute exacerbation   Asthma-COPD overlap syndrome (HCC)   Class 3 severe obesity due to excess calories without serious comorbidity with body mass index (BMI) of 60.0 to 69.9 in adult [facility])   Tobacco abuse   Leg swelling   DCM (dilated cardiomyopathy) (HCC)   Acute combined systolic and diastolic heart failure (HCC)   Principal problem Acute hypoxic respiratory failure due to asthma exacerbation in the setting of rhinovirus infection, also a component of acute systolic CHF-breathing improved today, but still has oxygen on.  She tells me she put it for comfort so not clear if she is desatting.  Wean off to room air as tolerated.  RVP was positive for rhinovirus and she did have a sick contacts.  She has been placed on azithromycin for 5 days, also on steroids.  Steroids and azithromycin are scheduled to be done tomorrow  Active problems Acute  combined systolic and diastolic CHF-possibly acute, but not sure.  She has been having some progressive dyspnea for several weeks now and actually had a cardiology appointment on 9/14.  2D echo done shows an EF of 34%, grade 2 diastolic dysfunction.  This is new, her prior EF was normal few years back. She was on phentermine for about a month mid July-mid August, do not think it can cause depressed EF but defer to cardiology.  -Cardiology consulted, appreciate input.  She has been placed on Entresto this morning, she is also on IV Lasix.  Cardiac catheterization is scheduled today  Troponin elevation-mild, overall flat, likely in the setting of #1.  Probably demand ischemia.    Tobacco use-she is working with her PCP regarding cessation  Obesity, morbid-BMI 64.  She tells me that over the last few years she might have lost close to 100 pounds.  Actively working with her PCP regarding weight loss  Depression-continue Wellbutrin.   Scheduled Meds:  azithromycin  500 mg Oral Daily   buPROPion  150 mg Oral Daily   empagliflozin  10 mg Oral Daily   enoxaparin (LOVENOX) injection  80 mg Subcutaneous Q24H   furosemide  40 mg Intravenous Q12H   ipratropium-albuterol  3 mL Nebulization TID   mometasone-formoterol  2 puff Inhalation BID   montelukast  10 mg Oral QHS   predniSONE  40 mg Oral Q breakfast   sacubitril-valsartan  1 tablet Oral BID   sodium chloride flush  3 mL Intravenous Q12H   spironolactone  12.5 mg Oral Daily   Continuous Infusions:  sodium chloride  sodium chloride 10 mL/hr at 06/11/22 0936   PRN Meds:.sodium chloride, acetaminophen **OR** acetaminophen, albuterol, ondansetron **OR** ondansetron (ZOFRAN) IV, sodium chloride flush  Current Outpatient Medications  Medication Instructions   albuterol (VENTOLIN HFA) 108 (90 Base) MCG/ACT inhaler 2 puffs, Inhalation, Every 4 hours PRN   budesonide (PULMICORT) 1 mg, Nebulization, 4 times daily PRN   budesonide-formoterol  (SYMBICORT) 160-4.5 MCG/ACT inhaler Take 2 puffs twice daily   buPROPion (WELLBUTRIN XL) 150 MG 24 hr tablet TAKE 1 TABLET BY MOUTH EVERY DAY IN THE MORNING   cetirizine (ZYRTEC) 10 mg, Oral, Daily PRN   EPINEPHrine (EPI-PEN) 0.3 mg, Intramuscular, As needed   fluticasone (FLONASE SENSIMIST) 27.5 MCG/SPRAY nasal spray 2 sprays, Nasal, Daily   ipratropium-albuterol (DUONEB) 0.5-2.5 (3) MG/3ML SOLN INHALE 1 VIAL BY MOUTH VIA NEBULIZER EVERY 6 HOURS AS NEEDED   montelukast (SINGULAIR) 10 mg, Oral, Daily at bedtime, TAKE 1 TABLET BY MOUTH EVERYDAY AT BEDTIME<BR>Strength: 10 mg   nicotine (NICODERM CQ - DOSED IN MG/24 HOURS) 21 mg, Transdermal, Every 24 hours   nicotine polacrilex (NICORETTE) 4 mg, Oral, As needed   ondansetron (ZOFRAN) 4 mg, Oral, Every 6 hours   phentermine 15 mg, Oral, BH-each morning    Diet Orders (From admission, onward)     Start     Ordered   06/11/22 0001  Diet NPO time specified Except for: Sips with Meds  Diet effective midnight       Comments: NPO for solid foods after midnight, may have clear liquids until 5am, then NPO (this would be for inpatients and outpatients)  Question:  Except for  Answer:  Sips with Meds   06/10/22 1259            DVT prophylaxis:   Lovenox   Lab Results  Component Value Date   PLT 186 06/11/2022      Code Status: Full Code  Family Communication: No family at bedside  Status is: Inpatient Remains inpatient appropriate because: Cardiac cath today   Level of care: Progressive  Consultants:  Cardiology  Objective: Vitals:   06/11/22 0531 06/11/22 0805 06/11/22 0930 06/11/22 1255  BP: 126/85  (!) 145/74 (!) 142/85  Pulse: 69 71 70 86  Resp: '20 20 20 '$ (!) 29  Temp: (!) 97.5 F (36.4 C)  97.7 F (36.5 C)   TempSrc: Oral  Oral   SpO2: 94% 96% 92% 97%  Weight:      Height:        Intake/Output Summary (Last 24 hours) at 06/11/2022 1325 Last data filed at 06/10/2022 2230 Gross per 24 hour  Intake 480 ml  Output  236 ml  Net 244 ml    Wt Readings from Last 3 Encounters:  06/11/22 (!) 150.4 kg  05/01/22 (!) 156.5 kg  04/21/22 (!) 156.5 kg    Examination:  Constitutional: NAD Eyes: lids and conjunctivae normal, no scleral icterus ENMT: mmm Neck: normal, supple Respiratory: clear to auscultation bilaterally, no wheezing, no crackles. Normal respiratory effort.  Cardiovascular: Regular rate and rhythm, no murmurs / rubs / gallops. 1+ LE edema. Abdomen: soft, no distention, no tenderness. Bowel sounds positive.  Skin: no rashes Neurologic: no focal deficits, equal strength  Data Reviewed: I have independently reviewed following labs and imaging studies   CBC Recent Labs  Lab 06/08/22 1345 06/09/22 0519 06/10/22 0522 06/11/22 0504  WBC 11.3* 8.5 11.5* 10.9*  HGB 15.6* 15.4* 15.9* 15.6*  HCT 50.2* 49.6* 50.8* 50.5*  PLT 205 163 186 186  MCV  95.8 96.5 95.5 95.8  MCH 29.8 30.0 29.9 29.6  MCHC 31.1 31.0 31.3 30.9  RDW 13.1 13.1 13.2 13.2     Recent Labs  Lab 06/08/22 1345 06/09/22 0825 06/10/22 0522 06/11/22 0504  NA 138 139 137 140  K 3.6 4.3 4.4 3.9  CL 105 102 103 100  CO2 '28 29 30 '$ 34*  GLUCOSE 131* 114* 121* 77  BUN '7 11 18 20  '$ CREATININE 0.67 0.67 0.73 0.75  CALCIUM 8.5* 9.0 9.0 8.6*  AST  --   --   --  14*  ALT  --   --   --  15  ALKPHOS  --   --   --  64  BILITOT  --   --   --  1.2  ALBUMIN  --   --   --  3.3*  MG  --   --  2.1 2.2  BNP 128.3*  --   --   --      ------------------------------------------------------------------------------------------------------------------ No results for input(s): "CHOL", "HDL", "LDLCALC", "TRIG", "CHOLHDL", "LDLDIRECT" in the last 72 hours.  Lab Results  Component Value Date   HGBA1C 5.0 04/21/2022   ------------------------------------------------------------------------------------------------------------------ No results for input(s): "TSH", "T4TOTAL", "T3FREE", "THYROIDAB" in the last 72 hours.  Invalid input(s):  "FREET3"  Cardiac Enzymes No results for input(s): "CKMB", "TROPONINI", "MYOGLOBIN" in the last 168 hours.  Invalid input(s): "CK" ------------------------------------------------------------------------------------------------------------------    Component Value Date/Time   BNP 128.3 (H) 06/08/2022 1345    CBG: No results for input(s): "GLUCAP" in the last 168 hours.  Recent Results (from the past 240 hour(s))  SARS Coronavirus 2 by RT PCR (hospital order, performed in [facility] hospital lab) *cepheid single result test* Anterior Nasal Swab     Status: None   Collection Time: 06/08/22  1:49 PM   Specimen: Anterior Nasal Swab  Result Value Ref Range Status   SARS Coronavirus 2 by RT PCR NEGATIVE NEGATIVE Final    Comment: (NOTE) SARS-CoV-2 target nucleic acids are NOT DETECTED.  The SARS-CoV-2 RNA is generally detectable in upper and lower respiratory specimens during the acute phase of infection. The lowest concentration of SARS-CoV-2 viral copies this assay can detect is 250 copies / mL. A negative result does not preclude SARS-CoV-2 infection and should not be used as the sole basis for treatment or other patient management decisions.  A negative result may occur with improper specimen collection / handling, submission of specimen other than nasopharyngeal swab, presence of viral mutation(s) within the areas targeted by this assay, and inadequate number of viral copies (<250 copies / mL). A negative result must be combined with clinical observations, patient history, and epidemiological information.  Fact Sheet for Patients:   https://www.patel.info/  Fact Sheet for Healthcare Providers: https://hall.com/  This test is not yet approved or  cleared by the [place] FDA and has been authorized for detection and/or diagnosis of SARS-CoV-2 by FDA under an Emergency Use Authorization (EUA).  This EUA will remain in effect  (meaning this test can be used) for the duration of the COVID-19 declaration under Section 564(b)(1) of the Act, 21 U.S.C. section 360bbb-3(b)(1), unless the authorization is terminated or revoked sooner.  Performed at [facility], [place] [address] [place], [place] [address]   Respiratory (~20 pathogens) panel by PCR     Status: Abnormal   Collection Time: 06/08/22  1:49 PM   Specimen: Nasopharyngeal Swab; Respiratory  Result Value Ref Range Status   Adenovirus NOT DETECTED  NOT DETECTED Final   Coronavirus 229E NOT DETECTED NOT DETECTED Final    Comment: (NOTE) The Coronavirus on the Respiratory Panel, DOES NOT test for the novel  Coronavirus (2019 nCoV)    Coronavirus HKU1 NOT DETECTED NOT DETECTED Final   Coronavirus NL63 NOT DETECTED NOT DETECTED Final   Coronavirus OC43 NOT DETECTED NOT DETECTED Final   Metapneumovirus NOT DETECTED NOT DETECTED Final   Rhinovirus / Enterovirus DETECTED (A) NOT DETECTED Final   Influenza A NOT DETECTED NOT DETECTED Final   Influenza B NOT DETECTED NOT DETECTED Final   Parainfluenza Virus 1 NOT DETECTED NOT DETECTED Final   Parainfluenza Virus 2 NOT DETECTED NOT DETECTED Final   Parainfluenza Virus 3 NOT DETECTED NOT DETECTED Final   Parainfluenza Virus 4 NOT DETECTED NOT DETECTED Final   Respiratory Syncytial Virus NOT DETECTED NOT DETECTED Final   Bordetella pertussis NOT DETECTED NOT DETECTED Final   Bordetella Parapertussis NOT DETECTED NOT DETECTED Final   Chlamydophila pneumoniae NOT DETECTED NOT DETECTED Final   Mycoplasma pneumoniae NOT DETECTED NOT DETECTED Final    Comment: Performed at [facility], [place] [address] [place], [place] [address]     Radiology Studies: No results found.   Marzetta Board, MD, PhD [facility]  Between 7 am - 7 pm I am available, please contact me via Amion (for emergencies) or Securechat (non urgent messages)  Between 7 pm - 7 am I am not available, please contact  night coverage MD/APP via Amion

## 2022-06-11 NOTE — Evaluation (Signed)
Pt arrive to unit from cath lab; pt alert and oriented to person place time and situation; pt denies pain at this time; pt right radial with bandage intact; pt vitals stable (see flowsheet)

## 2022-06-12 DIAGNOSIS — I5020 Unspecified systolic (congestive) heart failure: Secondary | ICD-10-CM

## 2022-06-12 MED ORDER — EMPAGLIFLOZIN 10 MG PO TABS: 10.0000 mg | ORAL_TABLET | Freq: Every day | ORAL | 3 refills | Status: DC

## 2022-06-12 MED ORDER — SPIRONOLACTONE 25 MG PO TABS: 12.5000 mg | ORAL_TABLET | Freq: Every day | ORAL | 3 refills | Status: DC

## 2022-06-12 MED ORDER — SACUBITRIL-VALSARTAN 24-26 MG PO TABS: 1.0000 | ORAL_TABLET | Freq: Two times a day (BID) | ORAL | 3 refills | Status: DC

## 2022-06-12 MED ORDER — FUROSEMIDE 20 MG PO TABS: 20.0000 mg | ORAL_TABLET | Freq: Every day | ORAL | 4 refills | Status: DC

## 2022-06-12 NOTE — Discharge Summary (Signed)
Physician Discharge Summary   Patient: April Ellis MRN: [id number] DOB: January 09, 1976  Admit date:     06/08/2022  Discharge date: 06/12/22  Discharge Physician: Estill Cotta, MD    PCP: [name], FNP   Recommendations at discharge:   Patient started on Jardiance, Entresto, Aldactone, Lasix 20 mg daily Eventually may be able to start beta-blocker outpatient Needs follow-up appointment with advanced heart failure clinic  Discharge Diagnoses:    Acute respiratory failure with hypoxia (HCC)   Asthma, chronic, unspecified asthma severity, with acute exacerbation, in the setting of rhinovirus infection   Acute combined systolic and diastolic heart failure (HCC)   Asthma-COPD overlap syndrome (HCC)   Class 3 severe obesity due to excess calories without serious comorbidity with body mass index (BMI) of 60.0 to 69.9 in adult [facility])   Tobacco abuse    DCM (dilated cardiomyopathy) ([place]) Demand ischemia with mild troponin elevation [facility])   Hospital Course: 46 year old female with asthma/COPD overlap syndrome, not on home oxygen, ongoing tobacco use, obesity, here with acute onset of chest tightness associated with shortness of breath and wheezing.  Chest x-ray on admission without obvious infiltrates.  Initially required 6 L nasal cannula, was given steroids, nebulizers, antibiotics and was admitted to the hospital    Assessment and Plan:  Acute hypoxic respiratory failure due to asthma exacerbation in the setting of rhinovirus infection,  acute systolic CHF -Breathing much improved, off O2, sats 95% on room air -RVP was positive for rhinovirus and had positive sick contact -Day #5 for Zithromax today and prednisone, does not need any further antibiotics or steroids.  No wheezing   Acute combined systolic and diastolic CHF -Progressive dyspnea for several weeks and had cardiology appointment on 9/14 outpatient.   -2D echo showed EF of 35%, grade 2 DD, RV contraction  normal -Cardiac cath on 9/15 showed normal coronaries, normal cardiac output and index with low filling pressures -Seen by cardiology today, cleared for discharge home with medical therapy. -Continue current doses of Jardiance, Entresto and Aldactone, Lasix 20 mg daily, low-salt diet -Eventually may be able to start beta-blockers as an outpatient, concern for asthma/COPD  Demand ischemia with mild troponin elevation -Likely due to #1 and #2 -Cardiac cath on 9/15 showed normal coronaries     Tobacco use -Counseled on nicotine cessation, she is working with her PCP   Obesity, morbid-BMI 68.  - Actively working with her PCP regarding weight loss    Depression --continue Wellbutrin.      Pain control - [employer] Controlled Substance Reporting System database was reviewed. and patient was instructed, not to drive, operate heavy machinery, perform activities at heights, swimming or participation in water activities or provide baby-sitting services while on Pain, Sleep and Anxiety Medications; until their outpatient Physician has advised to do so again. Also recommended to not to take more than prescribed Pain, Sleep and Anxiety Medications.  Consultants: Cardiology Procedures performed: Cardiac catheterization Disposition: Home Diet recommendation:  Discharge Diet Orders (From admission, onward)     Start     Ordered   06/12/22 0000  Diet - low sodium heart healthy        06/12/22 0941           Cardiac diet DISCHARGE MEDICATION: Allergies as of 06/12/2022       Reactions   Penicillins Other (See Comments)   Childhood Allergy Did it involve swelling of the face/tongue/throat, SOB, or low BP? UNK Did it involve sudden or severe rash/hives, skin  peeling, or any reaction on the inside of your mouth or nose? UNK Did you need to seek medical attention at a hospital or doctor's office? UNK When did it last happen?      more than 10 years If all above answers are "NO", may  proceed with cephalosporin use.   Pineapple Other (See Comments)   Tested allergic to this        Medication List     STOP taking these medications    Flonase Sensimist 27.5 MCG/SPRAY nasal spray Generic drug: fluticasone   nicotine 21 mg/24hr patch Commonly known as: NICODERM CQ - dosed in mg/24 hours   phentermine 15 MG capsule       TAKE these medications    albuterol 108 (90 Base) MCG/ACT inhaler Commonly known as: VENTOLIN HFA Inhale 2 puffs into the lungs every 4 (four) hours as needed for wheezing or shortness of breath. What changed: when to take this   budesonide 1 MG/2ML nebulizer solution Commonly known as: PULMICORT TAKE 2 MLS (1 MG TOTAL) BY NEBULIZATION 4 (FOUR) TIMES DAILY AS NEEDED.   budesonide-formoterol 160-4.5 MCG/ACT inhaler Commonly known as: SYMBICORT Take 2 puffs twice daily What changed:  how much to take how to take this when to take this additional instructions   buPROPion 150 MG 24 hr tablet Commonly known as: WELLBUTRIN XL TAKE 1 TABLET BY MOUTH EVERY DAY IN THE MORNING What changed:  how much to take how to take this when to take this additional instructions   cetirizine 10 MG tablet Commonly known as: ZYRTEC Take 10 mg by mouth daily as needed for allergies or rhinitis.   empagliflozin 10 MG Tabs tablet Commonly known as: JARDIANCE Take 1 tablet (10 mg total) by mouth daily.   EPINEPHrine 0.3 mg/0.3 mL Soaj injection Commonly known as: EPI-PEN Inject 0.3 mg into the muscle as needed for anaphylaxis.   furosemide 20 MG tablet Commonly known as: Lasix Take 1 tablet (20 mg total) by mouth daily.   ipratropium-albuterol 0.5-2.5 (3) MG/3ML Soln Commonly known as: DUONEB INHALE 1 VIAL BY MOUTH VIA NEBULIZER EVERY 6 HOURS AS NEEDED What changed:  how much to take how to take this when to take this reasons to take this additional instructions   montelukast 10 MG tablet Commonly known as: SINGULAIR Take 1 tablet (10  mg total) by mouth at bedtime. TAKE 1 TABLET BY MOUTH EVERYDAY AT BEDTIME Strength: 10 mg What changed: additional instructions   Nicorette 4 MG lozenge Generic drug: nicotine polacrilex Take 4 mg by mouth as needed for smoking cessation.   ondansetron 4 MG tablet Commonly known as: ZOFRAN Take 1 tablet (4 mg total) by mouth every 6 (six) hours. What changed:  when to take this reasons to take this   sacubitril-valsartan 24-26 MG Commonly known as: ENTRESTO Take 1 tablet by mouth 2 (two) times daily.   spironolactone 25 MG tablet Commonly known as: ALDACTONE Take 0.5 tablets (12.5 mg total) by mouth daily.        Follow-up Information     [name], FNP. Schedule an appointment as soon as possible for a visit in 2 week(s).   Specialty: General Practice Why: for hospital follow-up Contact information: [address] [place] [address] [phone]         [facility]. Schedule an appointment as soon as possible for a visit in 2 week(s).   Specialty: Cardiology Why: for hospital follow-up Contact  information: [address] [id number] [place] [phone]               Discharge Exam: Danley Danker Weights   06/10/22 0500 06/11/22 0500 06/12/22 0500  Weight: (!) 154.8 kg (!) 150.4 kg (!) 147.6 kg   S: Breathing on room air, no chest pain or palpitations.  Feels like she is ready to go home.  No fevers, or cough no wheezing  Vitals:   06/12/22 0500 06/12/22 0624 06/12/22 0753 06/12/22 1011  BP:  (!) 135/91  129/76  Pulse:  79  88  Resp:  17  18  Temp:  97.6 F (36.4 C)    TempSrc:  Oral    SpO2:  93% 95% 95%  Weight: (!) 147.6 kg     Height:        Physical Exam General: Alert and oriented x 3, NAD Cardiovascular: S1 S2 clear, RRR.  Respiratory: CTAB, no wheezing, rales or rhonchi Gastrointestinal: Soft, nontender, nondistended, NBS Ext: no pedal edema  bilaterally Neuro: no new deficits  Condition at discharge: good  The results of significant diagnostics from this hospitalization (including imaging, microbiology, ancillary and laboratory) are listed below for reference.   Imaging Studies: CARDIAC CATHETERIZATION  Result Date: 06/11/2022 1.  Normal right dominant circulation. 2.  Fick cardiac output of 9.9 L/min, cardiac index of 4.2 L/min/m, with a mean RA pressure of 3 mmHg, mean wedge pressure of 5 mmHg, and LVEDP of 7 mmHg. Recommendation: Goal-directed medical therapy.   ECHOCARDIOGRAM COMPLETE  Result Date: 06/09/2022    ECHOCARDIOGRAM REPORT   Patient Name:   MALIE KASHANI [facility] Date of Exam: 06/09/2022 Medical Rec #:  [id number]       Height:       61.0 in Accession #:    [id number]      Weight:       342.2 lb Date of Birth:  November 02, 1975       BSA:          2.373 m Patient Age:    46 years        BP:           126/72 mmHg Patient Gender: F               HR:           78 bpm. Exam Location:  Inpatient Procedure: 2D Echo, Cardiac Doppler and Color Doppler Indications:    CHF  History:        Patient has prior history of Echocardiogram examinations, most                 recent 05/13/2014. COPD.  Sonographer:    Jefferey Pica Referring Phys: [id number] CHRISTOPHER P DANFORD IMPRESSIONS  1. Left ventricular ejection fraction, by estimation, is 35%. The left ventricle has moderately decreased function. The left ventricle demonstrates global hypokinesis. The left ventricular internal cavity size was mildly dilated. Left ventricular diastolic parameters are consistent with Grade II diastolic dysfunction (pseudonormalization).  2. Right ventricular systolic function is normal. The right ventricular size is normal. There is mildly elevated pulmonary artery systolic pressure. The estimated right ventricular systolic pressure is 15.1 mmHg.  3. Left atrial size was mildly dilated.  4. Right atrial size was mildly dilated.  5. The mitral valve is normal in  structure. Trivial mitral valve regurgitation. No evidence of mitral stenosis.  6. The aortic valve is tricuspid. Aortic valve regurgitation is not visualized. No aortic stenosis is present.  7. The inferior vena cava is dilated in size with <50% respiratory variability, suggesting right atrial pressure of 15 mmHg. FINDINGS  Left Ventricle: Left ventricular ejection fraction, by estimation, is 35%. The left ventricle has moderately decreased function. The left ventricle demonstrates global hypokinesis. The left ventricular internal cavity size was mildly dilated. There is no left ventricular hypertrophy. Left ventricular diastolic parameters are consistent with Grade II diastolic dysfunction (pseudonormalization). Right Ventricle: The right ventricular size is normal. No increase in right ventricular wall thickness. Right ventricular systolic function is normal. There is mildly elevated pulmonary artery systolic pressure. The tricuspid regurgitant velocity is 2.67  m/s, and with an assumed right atrial pressure of 15 mmHg, the estimated right ventricular systolic pressure is 07.3 mmHg. Left Atrium: Left atrial size was mildly dilated. Right Atrium: Right atrial size was mildly dilated. Pericardium: There is no evidence of pericardial effusion. Mitral Valve: The mitral valve is normal in structure. Trivial mitral valve regurgitation. No evidence of mitral valve stenosis. Tricuspid Valve: The tricuspid valve is normal in structure. Tricuspid valve regurgitation is trivial. Aortic Valve: The aortic valve is tricuspid. Aortic valve regurgitation is not visualized. No aortic stenosis is present. Aortic valve peak gradient measures 11.5 mmHg. Pulmonic Valve: The pulmonic valve was normal in structure. Pulmonic valve regurgitation is trivial. Aorta: The aortic root is normal in size and structure. Venous: The inferior vena cava is dilated in size with less than 50% respiratory variability, suggesting right atrial pressure  of 15 mmHg. IAS/Shunts: No atrial level shunt detected by color flow Doppler.  LEFT VENTRICLE PLAX 2D LVIDd:         6.35 cm   Diastology LVIDs:         4.80 cm   LV e' medial:    6.06 cm/s LV PW:         1.15 cm   LV E/e' medial:  13.1 LV IVS:        1.05 cm   LV e' lateral:   6.75 cm/s LVOT diam:     2.00 cm   LV E/e' lateral: 11.7 LV SV:         92 LV SV Index:   39 LVOT Area:     3.14 cm  IVC IVC diam: 3.20 cm LEFT ATRIUM             Index        RIGHT ATRIUM           Index LA diam:        3.10 cm 1.31 cm/m   RA Area:     21.20 cm LA Vol (A2C):   63.6 ml 26.80 ml/m  RA Volume:   67.10 ml  28.28 ml/m LA Vol (A4C):   59.9 ml 25.24 ml/m LA Biplane Vol: 65.0 ml 27.39 ml/m  AORTIC VALVE                 PULMONIC VALVE AV Area (Vmax): 2.63 cm     PV Vmax:       0.93 m/s AV Vmax:        169.50 cm/s  PV Peak grad:  3.5 mmHg AV Peak Grad:   11.5 mmHg LVOT Vmax:      142.00 cm/s LVOT Vmean:     96.000 cm/s LVOT VTI:       0.294 m  AORTA Ao Root diam: 2.90 cm Ao Asc diam:  3.20 cm MITRAL VALVE  TRICUSPID VALVE MV Area (PHT): 3.50 cm    TR Peak grad:   28.5 mmHg MV Decel Time: 217 msec    TR Vmax:        267.00 cm/s MV E velocity: 79.10 cm/s MV A velocity: 70.50 cm/s  SHUNTS MV E/A ratio:  1.12        Systemic VTI:  0.29 m                            Systemic Diam: 2.00 cm Dalton McleanMD Electronically signed by Franki Monte Signature Date/Time: 06/09/2022/3:15:54 PM    Final    DG Chest Portable 1 View  Result Date: 06/08/2022 CLINICAL DATA:  Shortness of breath EXAM: PORTABLE CHEST 1 VIEW COMPARISON:  05/01/2022 FINDINGS: Overlying artifact. Heart and mediastinal shadows appear normal. The lungs are clear allowing for the overlying artifact. No visible pleural fluid. IMPRESSION: Overlying artifact.  No active disease identified. Electronically Signed   By: Nelson Chimes M.D.   On: 06/08/2022 14:17    Microbiology: Results for orders placed or performed during the hospital encounter of  06/08/22  SARS Coronavirus 2 by RT PCR (hospital order, performed in [facility] hospital lab) *cepheid single result test* Anterior Nasal Swab     Status: None   Collection Time: 06/08/22  1:49 PM   Specimen: Anterior Nasal Swab  Result Value Ref Range Status   SARS Coronavirus 2 by RT PCR NEGATIVE NEGATIVE Final    Comment: (NOTE) SARS-CoV-2 target nucleic acids are NOT DETECTED.  The SARS-CoV-2 RNA is generally detectable in upper and lower respiratory specimens during the acute phase of infection. The lowest concentration of SARS-CoV-2 viral copies this assay can detect is 250 copies / mL. A negative result does not preclude SARS-CoV-2 infection and should not be used as the sole basis for treatment or other patient management decisions.  A negative result may occur with improper specimen collection / handling, submission of specimen other than nasopharyngeal swab, presence of viral mutation(s) within the areas targeted by this assay, and inadequate number of viral copies (<250 copies / mL). A negative result must be combined with clinical observations, patient history, and epidemiological information.  Fact Sheet for Patients:   https://www.patel.info/  Fact Sheet for Healthcare Providers: https://hall.com/  This test is not yet approved or  cleared by the [place] FDA and has been authorized for detection and/or diagnosis of SARS-CoV-2 by FDA under an Emergency Use Authorization (EUA).  This EUA will remain in effect (meaning this test can be used) for the duration of the COVID-19 declaration under Section 564(b)(1) of the Act, 21 U.S.C. section 360bbb-3(b)(1), unless the authorization is terminated or revoked sooner.  Performed at [facility], [place] [address] [place], [place] [address]   Respiratory (~20 pathogens) panel by PCR     Status: Abnormal   Collection Time: 06/08/22  1:49 PM   Specimen:  Nasopharyngeal Swab; Respiratory  Result Value Ref Range Status   Adenovirus NOT DETECTED NOT DETECTED Final   Coronavirus 229E NOT DETECTED NOT DETECTED Final    Comment: (NOTE) The Coronavirus on the Respiratory Panel, DOES NOT test for the novel  Coronavirus (2019 nCoV)    Coronavirus HKU1 NOT DETECTED NOT DETECTED Final   Coronavirus NL63 NOT DETECTED NOT DETECTED Final   Coronavirus OC43 NOT DETECTED NOT DETECTED Final   Metapneumovirus NOT DETECTED NOT DETECTED Final   Rhinovirus / Enterovirus DETECTED (A) NOT DETECTED  Final   Influenza A NOT DETECTED NOT DETECTED Final   Influenza B NOT DETECTED NOT DETECTED Final   Parainfluenza Virus 1 NOT DETECTED NOT DETECTED Final   Parainfluenza Virus 2 NOT DETECTED NOT DETECTED Final   Parainfluenza Virus 3 NOT DETECTED NOT DETECTED Final   Parainfluenza Virus 4 NOT DETECTED NOT DETECTED Final   Respiratory Syncytial Virus NOT DETECTED NOT DETECTED Final   Bordetella pertussis NOT DETECTED NOT DETECTED Final   Bordetella Parapertussis NOT DETECTED NOT DETECTED Final   Chlamydophila pneumoniae NOT DETECTED NOT DETECTED Final   Mycoplasma pneumoniae NOT DETECTED NOT DETECTED Final    Comment: Performed at [facility], [place] [address] [place], [place] [address]    Labs: CBC: Recent Labs  Lab 06/08/22 1345 06/09/22 0519 06/10/22 0522 06/11/22 0504 06/11/22 1506 06/11/22 1508 06/11/22 1510 06/11/22 1511  WBC 11.3* 8.5 11.5* 10.9*  --   --   --   --   HGB 15.6* 15.4* 15.9* 15.6* 17.7* 17.7* 17.0* 17.3*  HCT 50.2* 49.6* 50.8* 50.5* 52.0* 52.0* 50.0* 51.0*  MCV 95.8 96.5 95.5 95.8  --   --   --   --   PLT 205 163 186 186  --   --   --   --    Basic Metabolic Panel: Recent Labs  Lab 06/08/22 1345 06/09/22 0825 06/10/22 0522 06/11/22 0504 06/11/22 1506 06/11/22 1508 06/11/22 1510 06/11/22 1511  NA 138 139 137 140 138 139 139 141  K 3.6 4.3 4.4 3.9 3.9 3.9 3.7 3.7  CL 105 102 103 100  --   --   --   --   CO2 '28 29  30 '$ 34*  --   --   --   --   GLUCOSE 131* 114* 121* 77  --   --   --   --   BUN '7 11 18 20  '$ --   --   --   --   CREATININE 0.67 0.67 0.73 0.75  --   --   --   --   CALCIUM 8.5* 9.0 9.0 8.6*  --   --   --   --   MG  --   --  2.1 2.2  --   --   --   --    Liver Function Tests: Recent Labs  Lab 06/11/22 0504  AST 14*  ALT 15  ALKPHOS 64  BILITOT 1.2  PROT 6.5  ALBUMIN 3.3*   CBG: No results for input(s): "GLUCAP" in the last 168 hours.  Discharge time spent: greater than 30 minutes.  Signed: Estill Cotta, MD [facility] 06/12/2022

## 2022-06-12 NOTE — Progress Notes (Signed)
Patient is stable, alert, oriented x 4, ambulatory without assistance. Discharge instructions reviewed, questions concerns related to preauth for jardiance. Hospitalist provider updated. Pt denied additional questions, or concerns.

## 2022-06-12 NOTE — Progress Notes (Signed)
Progress Note  Patient Name: April Ellis Date of Encounter: 06/12/2022  Primary Cardiologist: New to [facility]  Subjective   Breathing comfortably, no longer on supplemental oxygen.  No chest pain or palpitations.  Inpatient Medications    Scheduled Meds:  azithromycin  500 mg Oral Daily   buPROPion  150 mg Oral Daily   empagliflozin  10 mg Oral Daily   enoxaparin (LOVENOX) injection  80 mg Subcutaneous Q24H   ipratropium-albuterol  3 mL Nebulization TID   mometasone-formoterol  2 puff Inhalation BID   montelukast  10 mg Oral QHS   predniSONE  40 mg Oral Q breakfast   sacubitril-valsartan  1 tablet Oral BID   sodium chloride flush  3 mL Intravenous Q12H   sodium chloride flush  3 mL Intravenous Q12H   spironolactone  12.5 mg Oral Daily   Continuous Infusions:  sodium chloride     PRN Meds: sodium chloride, acetaminophen **OR** acetaminophen, acetaminophen, albuterol, guaiFENesin-dextromethorphan, ondansetron **OR** ondansetron (ZOFRAN) IV, ondansetron (ZOFRAN) IV, sodium chloride flush   Vital Signs    Vitals:   06/11/22 2103 06/12/22 0500 06/12/22 0624 06/12/22 0753  BP: 137/83  (!) 135/91   Pulse: 79  79   Resp: 18  17   Temp: 97.6 F (36.4 C)  97.6 F (36.4 C)   TempSrc: Oral  Oral   SpO2: 96%  93% 95%  Weight:  (!) 147.6 kg    Height:        Intake/Output Summary (Last 24 hours) at 06/12/2022 0912 Last data filed at 06/12/2022 0539 Gross per 24 hour  Intake 714 ml  Output 2550 ml  Net -1836 ml   Filed Weights   06/10/22 0500 06/11/22 0500 06/12/22 0500  Weight: (!) 154.8 kg (!) 150.4 kg (!) 147.6 kg    Telemetry    Currently on telemetry.  Personally reviewed.  ECG    No ECG reviewed today.  Physical Exam   GEN: No acute distress.   Neck: Difficult to assess JVP. Cardiac: RRR, no murmur or gallop.  Respiratory: Nonlabored. Clear to auscultation bilaterally. GI: Soft, nontender, bowel sounds present. MS: No edema; No deformity.   Right radial artery access site dressed and stable. Neuro:  Nonfocal. Psych: Alert and oriented x 3. Normal affect.  Labs    Chemistry Recent Labs  Lab 06/09/22 0825 06/10/22 0522 06/11/22 0504 06/11/22 1506 06/11/22 1508 06/11/22 1510 06/11/22 1511  NA 139 137 140   < > 139 139 141  K 4.3 4.4 3.9   < > 3.9 3.7 3.7  CL 102 103 100  --   --   --   --   CO2 29 30 34*  --   --   --   --   GLUCOSE 114* 121* 77  --   --   --   --   BUN '11 18 20  '$ --   --   --   --   CREATININE 0.67 0.73 0.75  --   --   --   --   CALCIUM 9.0 9.0 8.6*  --   --   --   --   PROT  --   --  6.5  --   --   --   --   ALBUMIN  --   --  3.3*  --   --   --   --   AST  --   --  14*  --   --   --   --  ALT  --   --  15  --   --   --   --   ALKPHOS  --   --  64  --   --   --   --   BILITOT  --   --  1.2  --   --   --   --   GFRNONAA >60 >60 >60  --   --   --   --   ANIONGAP 8 4* 6  --   --   --   --    < > = values in this interval not displayed.     Hematology Recent Labs  Lab 06/09/22 0519 06/10/22 0522 06/11/22 0504 06/11/22 1506 06/11/22 1508 06/11/22 1510 06/11/22 1511  WBC 8.5 11.5* 10.9*  --   --   --   --   RBC 5.14* 5.32* 5.27*  --   --   --   --   HGB 15.4* 15.9* 15.6*   < > 17.7* 17.0* 17.3*  HCT 49.6* 50.8* 50.5*   < > 52.0* 50.0* 51.0*  MCV 96.5 95.5 95.8  --   --   --   --   MCH 30.0 29.9 29.6  --   --   --   --   MCHC 31.0 31.3 30.9  --   --   --   --   RDW 13.1 13.2 13.2  --   --   --   --   PLT 163 186 186  --   --   --   --    < > = values in this interval not displayed.    Cardiac Enzymes Recent Labs  Lab 06/08/22 1345 06/08/22 1749 06/09/22 1556  TROPONINIHS 6 18* 4    BNP Recent Labs  Lab 06/08/22 1345  BNP 128.3*     Radiology    CARDIAC CATHETERIZATION  Result Date: 06/11/2022 1.  Normal right dominant circulation. 2.  Fick cardiac output of 9.9 L/min, cardiac index of 4.2 L/min/m, with a mean RA pressure of 3 mmHg, mean wedge pressure of 5 mmHg, and  LVEDP of 7 mmHg. Recommendation: Goal-directed medical therapy.    Cardiac Studies   Echocardiogram 06/09/2022:  1. Left ventricular ejection fraction, by estimation, is 35%. The left  ventricle has moderately decreased function. The left ventricle  demonstrates global hypokinesis. The left ventricular internal cavity size  was mildly dilated. Left ventricular  diastolic parameters are consistent with Grade II diastolic dysfunction  (pseudonormalization).   2. Right ventricular systolic function is normal. The right ventricular  size is normal. There is mildly elevated pulmonary artery systolic  pressure. The estimated right ventricular systolic pressure is 90.3 mmHg.   3. Left atrial size was mildly dilated.   4. Right atrial size was mildly dilated.   5. The mitral valve is normal in structure. Trivial mitral valve  regurgitation. No evidence of mitral stenosis.   6. The aortic valve is tricuspid. Aortic valve regurgitation is not  visualized. No aortic stenosis is present.   7. The inferior vena cava is dilated in size with <50% respiratory  variability, suggesting right atrial pressure of 15 mmHg.   Assessment & Plan    1.  HFrEF with nonischemic cardiomyopathy, LVEF approximately 35% and RV contraction normal.  Cardiac catheterization from yesterday showed normal coronaries, also normal cardiac output and index with low filling pressures.  2.  Tobacco abuse, we discussed smoking cessation and she seems motivated to  quit.  3.  Morbid obesity.  Chart reviewed as well as recent testing.  Discussed situation with the patient.  Expect she should be able to go home today on medical therapy, we will set up a TOC visit in the heart failure clinic to arrange follow-up from there.  From a cardiac perspective would plan to continue current doses of Jardiance, Entresto, and Aldactone.  Lasix can be 20 mg daily as an outpatient for now.  There was concern about beta-blocker given asthma/COPD,  but she eventually may be able to be started on bisoprolol as an outpatient.  Signed, Rozann Lesches, MD  06/12/2022, 9:12 AM

## 2022-06-13 LAB — LIPOPROTEIN A (LPA): Lipoprotein (a): 213.8 nmol/L — ABNORMAL HIGH (ref <75.0)

## 2022-06-14 ENCOUNTER — Encounter (HOSPITAL_COMMUNITY): Payer: Self-pay | Admitting: Internal Medicine

## 2022-06-14 ENCOUNTER — Telehealth: Payer: Self-pay

## 2022-06-14 NOTE — Telephone Encounter (Signed)
Transition Care Management Follow-up Telephone Call Date of discharge and from where: 06/12/2022. [facility]  How have you been since you were released from the hospital? Pt states she feels good.  Any questions or concerns? No  Items Reviewed: Did the pt receive and understand the discharge instructions provided? Yes  Medications obtained and verified? Yes  Other? Yes  Any new allergies since your discharge? No  Dietary orders reviewed? Yes Do you have support at home? Yes   Home Care and Equipment/Supplies: Were home health services ordered? no If so, what is the name of the agency? N/a  Has the agency set up a time to come to the patient's home? no Were any new equipment or medical supplies ordered?  No What is the name of the medical supply agency? N/a Were you able to get the supplies/equipment? no Do you have any questions related to the use of the equipment or supplies? No  Functional Questionnaire: (I = Independent and D = Dependent) ADLs: i  Bathing/Dressing- i  Meal Prep- i  Eating- i  Maintaining continence- i  Transferring/Ambulation- i  Managing Meds- i  Follow up appointments reviewed:  PCP Hospital f/u appt confirmed? Yes  Scheduled to see n/a on n.a @ n/a. [facility] f/u appt confirmed? No  Scheduled to see n/a on n/a @ n/a. Are transportation arrangements needed? No  If their condition worsens, is the pt aware to call PCP or go to the Emergency Dept.? Yes Was the patient provided with contact information for the PCP's office or ED? Yes Was to pt encouraged to call back with questions or concerns? Yes

## 2022-06-15 NOTE — Progress Notes (Incomplete)
HEART & VASCULAR TRANSITION OF CARE CONSULT NOTE     Referring Physician: Primary Care: Primary Cardiologist:  HPI: Referred to clinic by *** for heart failure consultation.   46 y.o. female with history of COPD/asthma, morbid obesity, tobacco use, newly diagnosed systolic CHF.   She had CTA chest 05/01/22 while in ED for fever, chills, CP with inspiration, and tachycardia. There was no evidence of PE. Dilated main pulmonary artery incidentally noted. She was treated with IV fluids and abx for possible pyelonephritis and bronchitis (findings suggestive of both noted on CT). Had been referred to cardiology for findings of pulmonary HTN on CT.  Had appointment with Cardiology 09/14 but was admitted with acute respiratory failure with hypoxia requiring supplemental O2 2/2 asthma exacerbation and acute CHF.  BNP just mildly elevated at 128. Respiratory panel positive for rhinovirus. ECG with sinus tachycardia 133 bpm.  Echo EF 35%, RV okay, dilated IVC. R/LHC: No CAD, RA 3, PA mean 26, PCWP 5, LVEDP 7, Fick CO/CI 9.9/4.18. She was diuresed with IV lasix and initiated on GDMT.      Cardiac Testing    Review of Systems: [y] = yes, '[ ]'$  = no   General: Weight gain '[ ]'$ ; Weight loss '[ ]'$ ; Anorexia '[ ]'$ ; Fatigue '[ ]'$ ; Fever '[ ]'$ ; Chills '[ ]'$ ; Weakness '[ ]'$   Cardiac: Chest pain/pressure '[ ]'$ ; Resting SOB '[ ]'$ ; Exertional SOB '[ ]'$ ; Orthopnea '[ ]'$ ; Pedal Edema '[ ]'$ ; Palpitations '[ ]'$ ; Syncope '[ ]'$ ; Presyncope '[ ]'$ ; Paroxysmal nocturnal dyspnea'[ ]'$   Pulmonary: Cough '[ ]'$ ; Wheezing'[ ]'$ ; Hemoptysis'[ ]'$ ; Sputum '[ ]'$ ; Snoring '[ ]'$   GI: Vomiting'[ ]'$ ; Dysphagia'[ ]'$ ; Melena'[ ]'$ ; Hematochezia '[ ]'$ ; Heartburn'[ ]'$ ; Abdominal pain '[ ]'$ ; Constipation '[ ]'$ ; Diarrhea '[ ]'$ ; BRBPR '[ ]'$   GU: Hematuria'[ ]'$ ; Dysuria '[ ]'$ ; Nocturia'[ ]'$   Vascular: Pain in legs with walking '[ ]'$ ; Pain in feet with lying flat '[ ]'$ ; Non-healing sores '[ ]'$ ; Stroke '[ ]'$ ; TIA '[ ]'$ ; Slurred speech '[ ]'$ ;  Neuro: Headaches'[ ]'$ ; Vertigo'[ ]'$ ; Seizures'[ ]'$ ; Paresthesias'[ ]'$ ;Blurred  vision '[ ]'$ ; Diplopia '[ ]'$ ; Vision changes '[ ]'$   Ortho/Skin: Arthritis '[ ]'$ ; Joint pain '[ ]'$ ; Muscle pain '[ ]'$ ; Joint swelling '[ ]'$ ; Back Pain '[ ]'$ ; Rash '[ ]'$   Psych: Depression'[ ]'$ ; Anxiety'[ ]'$   Heme: Bleeding problems '[ ]'$ ; Clotting disorders '[ ]'$ ; Anemia '[ ]'$   Endocrine: Diabetes '[ ]'$ ; Thyroid dysfunction'[ ]'$    Past Medical History:  Diagnosis Date   Anemia    Asthma    Bronchitis    COPD (chronic obstructive pulmonary disease) (HCC)    Dyspnea    with asthma flair   Obesity    Respiratory arrest ([place]) 04/04/2020    Current Outpatient Medications  Medication Sig Dispense Refill   albuterol (VENTOLIN HFA) 108 (90 Base) MCG/ACT inhaler Inhale 2 puffs into the lungs every 4 (four) hours as needed for wheezing or shortness of breath. (Patient taking differently: Inhale 2 puffs into the lungs every 2 (two) hours as needed for wheezing or shortness of breath.) 8.5 each 5   budesonide (PULMICORT) 1 MG/2ML nebulizer solution TAKE 2 MLS (1 MG TOTAL) BY NEBULIZATION 4 (FOUR) TIMES DAILY AS NEEDED. 60 mL 1   budesonide-formoterol (SYMBICORT) 160-4.5 MCG/ACT inhaler Take 2 puffs twice daily (Patient taking differently: Inhale 2 puffs into the lungs 2 (two) times daily.) 30.6 g 1   buPROPion (WELLBUTRIN XL) 150 MG 24 hr tablet TAKE 1 TABLET BY MOUTH EVERY DAY IN  THE MORNING (Patient taking differently: Take 150 mg by mouth in the morning.) 90 tablet 1   cetirizine (ZYRTEC) 10 MG tablet Take 10 mg by mouth daily as needed for allergies or rhinitis.     empagliflozin (JARDIANCE) 10 MG TABS tablet Take 1 tablet (10 mg total) by mouth daily. 30 tablet 3   EPINEPHrine 0.3 mg/0.3 mL IJ SOAJ injection Inject 0.3 mg into the muscle as needed for anaphylaxis. 2 each 5   furosemide (LASIX) 20 MG tablet Take 1 tablet (20 mg total) by mouth daily. 30 tablet 4   ipratropium-albuterol (DUONEB) 0.5-2.5 (3) MG/3ML SOLN INHALE 1 VIAL BY MOUTH VIA NEBULIZER EVERY 6 HOURS AS NEEDED (Patient taking differently: Take 3 mLs by  nebulization every 2 (two) hours as needed (for shortness of breath or wheezing).) 1080 mL 5   montelukast (SINGULAIR) 10 MG tablet Take 1 tablet (10 mg total) by mouth at bedtime. TAKE 1 TABLET BY MOUTH EVERYDAY AT BEDTIME Strength: 10 mg (Patient taking differently: Take 10 mg by mouth at bedtime.) 90 tablet 1   nicotine polacrilex (NICORETTE) 4 MG lozenge Take 4 mg by mouth as needed for smoking cessation.     ondansetron (ZOFRAN) 4 MG tablet Take 1 tablet (4 mg total) by mouth every 6 (six) hours. (Patient taking differently: Take 4 mg by mouth every 6 (six) hours as needed for nausea or vomiting.) 12 tablet 0   sacubitril-valsartan (ENTRESTO) 24-26 MG Take 1 tablet by mouth 2 (two) times daily. 60 tablet 3   spironolactone (ALDACTONE) 25 MG tablet Take 0.5 tablets (12.5 mg total) by mouth daily. 30 tablet 3   No current facility-administered medications for this visit.    Allergies  Allergen Reactions   Penicillins Other (See Comments)    Childhood Allergy Did it involve swelling of the face/tongue/throat, SOB, or low BP? UNK Did it involve sudden or severe rash/hives, skin peeling, or any reaction on the inside of your mouth or nose? UNK Did you need to seek medical attention at a hospital or doctor's office? UNK When did it last happen?      more than 10 years If all above answers are "NO", may proceed with cephalosporin use.    Pineapple Other (See Comments)    Tested allergic to this      Social History   Socioeconomic History   Marital status: Single    Spouse name: Not on file   Number of children: Not on file   Years of education: Not on file   Highest education level: Not on file  Occupational History   Occupation: customer service rep  Tobacco Use   Smoking status: Light Smoker    Packs/day: 0.25    Years: 15.00    Total pack years: 3.75    Types: Cigarettes    Last attempt to quit: 09/16/2014    Years since quitting: 7.7   Smokeless tobacco: Never   Tobacco  comments:    she continues to smoke 1-2 cigarettes a day  Vaping Use   Vaping Use: Never used  Substance and Sexual Activity   Alcohol use: Yes    Alcohol/week: 0.0 standard drinks of alcohol    Comment: occas.   Drug use: Yes    Types: Marijuana    Comment: occas.   Sexual activity: Not Currently  Other Topics Concern   Not on file  Social History Narrative   DIET: regular      DO YOU DRINK/EAT THINGS WITH CAFFEINE: Yes  MARITAL STATUS: Single      WHAT YEAR WERE YOU MARRIED:       DO YOU LIVE IN A HOUSE, APARTMENT, ASSISTED LIVING, CONDO TRAILER ETC.:  House       IS IT ONE OR MORE STORIES: 1      HOW MANY PERSONS LIVE IN YOUR HOME: 3      DO YOU HAVE PETS IN YOUR HOME: No      CURRENT OR PAST PROFESSION: customer Service Rep      DO YOU EXERCISE: very little      WHAT TYPE AND HOW OFTEN:   Social Determinants of Health   Financial Resource Strain: Not on file  Food Insecurity: No Food Insecurity (06/08/2022)   Hunger Vital Sign    Worried About Running Out of Food in the Last Year: Never true    Ran Out of Food in the Last Year: Never true  Transportation Needs: No Transportation Needs (06/08/2022)   PRAPARE - Hydrologist (Medical): No    Lack of Transportation (Non-Medical): No  Physical Activity: Not on file  Stress: Not on file  Social Connections: Not on file  Intimate Partner Violence: Not At Risk (06/08/2022)   Humiliation, Afraid, Rape, and Kick questionnaire    Fear of Current or Ex-Partner: No    Emotionally Abused: No    Physically Abused: No    Sexually Abused: No      Family History  Problem Relation Age of Onset   Diabetes Mother    Cancer Mother    Diabetes Sister    Hypertension Sister    Hypertension Sister    Urticaria Neg Hx    Immunodeficiency Neg Hx    Eczema Neg Hx    Asthma Neg Hx    Angioedema Neg Hx    Allergic rhinitis Neg Hx     There were no vitals filed for this visit.  PHYSICAL  EXAM: General:  Well appearing. No respiratory difficulty HEENT: normal Neck: supple. no JVD. Carotids 2+ bilat; no bruits. No lymphadenopathy or thryomegaly appreciated. Cor: PMI nondisplaced. Regular rate & rhythm. No rubs, gallops or murmurs. Lungs: clear Abdomen: soft, nontender, nondistended. No hepatosplenomegaly. No bruits or masses. Good bowel sounds. Extremities: no cyanosis, clubbing, rash, edema Neuro: alert & oriented x 3, cranial nerves grossly intact. moves all 4 extremities w/o difficulty. Affect pleasant.  ECG:   ASSESSMENT & PLAN: New systolic CHF/NICM:  2. Morbid obesity:  3. Tobacco abuse   NYHA *** GDMT  Diuretic- BB- Ace/ARB/ARNI MRA SGLT2i    Referred to HFSW (PCP, Medications, Transportation, ETOH Abuse, Drug Abuse, Insurance, Financial ): Yes or No Refer to Pharmacy: Yes or No Refer to Home Health: Yes on No Refer to Advanced Heart Failure Clinic: Yes or no  Refer to General Cardiology: Yes or No  Follow up

## 2022-06-15 NOTE — Progress Notes (Incomplete)
Heart [facility] Vascular Center Transitions of [facility] Heart Failure Pharmacist Encounter  PCP: [name], FNP PCP-Cardiologist: None  HPI:  [place] Human [phone] AAF with PMH significant for asthma/COPD overlap, obesity, iron deficiency anemia, and tobacco use. She presented to the ED on 9/12 with SOB, cough, and progressive dyspnea with exertion. She was also found to have bilateral LEE. Had a cardiology visit scheduled for 9/14.  ECHO demonstrated LVEF of 35% with grade II diastolic dysfunction, previous EF of 55%. R/L heart cath showed low filling pressures, normal cardiac output, and normal coronaries.  Today, April Ellis presents to the [facility] for follow up. She picked up her Entresto, however, the copay was $100. Will need a copay card today. She has not picked up her Jardiance as of yet because she had not received medication approval from her insurance. She stated the approval went through this morning for no charge. Otherwise, she has been taking all of her medications as prescribed. Education was provided on the indication and benefit of her GMDT for heart failure.   She was on phentermine a few years back and again for one month this summer. She denies SOB, orthopnea, and dyspnea, stating she is at her baseline. She denies any dizziness/lightheadedness. She has been following a low-sodium and fluid-restricted diet.    HF Medications: Jardiance '10mg'$  once daily (has not started yet), furosemide '20mg'$  once daily, Entresto 24/'26mg'$  twice daily, spironolactone '25mg'$  1/2 tablet once daily  Has the patient been experiencing any side effects to the medications prescribed?  Yes- some complaints of frequent urination  Does the patient have any problems obtaining medications due to transportation or finances?   no  Understanding of regimen: good Understanding of indications: good Potential of compliance: good Patient understands to avoid NSAIDs. Patient understands to  avoid decongestants.   Pertinent Lab Values: Serum creatinine 0.89, BUN 11, Potassium 4.1, Sodium 136, BNP 22.2, Magnesium 2.2 (9/15)  Vital Signs: Weight: 329 lbs (discharge weight: 325 lbs) Blood pressure: 126/70  Heart rate: 95   Medication Assistance / Insurance Benefits Check: Does the patient have prescription insurance?  Yes Type of insurance plan: [employer]re Poot [facility]  Outpatient Pharmacy:  Current outpatient pharmacy: [facility] [id number]  Assessment: 1) Acute systolic CHF (EF 54%), due to nonischemic causes. NYHA class II-IIII symptoms. - Not currently volume-overloaded. Can continue current diuretic dose.  -Could consider initiation of selective beta blocker therapy at a future visit.  -Appropriate to continue Entresto and spironolactone therapy and consider dose increase at a future visit. Continue to monitor kidney function and serum potassium levels. -Appropriate to continue Jardiance therapy at this time. She needs to pick this up from the pharmacy as it has recently been approved by insurance.   Plan: 1) Medication changes: - Begin taking Jardiance '10mg'$  once daily  2) Patient Assistance: - copay card provided for Entresto  3) Follow up: - Next appointment with PCP on 9/25. - Next appointment with Dr. Daniel Nones (Cardiology) on 10/18.  Maryan Puls, PharmD PGY-1 [facility] Pharmacy Resident

## 2022-06-16 ENCOUNTER — Other Ambulatory Visit (HOSPITAL_COMMUNITY): Payer: Self-pay

## 2022-06-16 ENCOUNTER — Ambulatory Visit (HOSPITAL_COMMUNITY)
Admission: RE | Admit: 2022-06-16 | Discharge: 2022-06-16 | Disposition: A | Discharge status: Home / Self Care | Payer: BC Managed Care – PPO | Source: Ambulatory Visit | Attending: Physician Assistant | Admitting: Physician Assistant

## 2022-06-16 ENCOUNTER — Telehealth (HOSPITAL_COMMUNITY): Payer: Self-pay

## 2022-06-16 VITALS — BP 126/70 | HR 95 | Wt 329.1 lb

## 2022-06-16 DIAGNOSIS — I5022 Chronic systolic (congestive) heart failure: Secondary | ICD-10-CM | POA: Insufficient documentation

## 2022-06-16 DIAGNOSIS — Z7984 Long term (current) use of oral hypoglycemic drugs: Secondary | ICD-10-CM | POA: Diagnosis not present

## 2022-06-16 DIAGNOSIS — J449 Chronic obstructive pulmonary disease, unspecified: Secondary | ICD-10-CM | POA: Diagnosis not present

## 2022-06-16 DIAGNOSIS — Z79899 Other long term (current) drug therapy: Secondary | ICD-10-CM | POA: Insufficient documentation

## 2022-06-16 DIAGNOSIS — Z716 Tobacco abuse counseling: Secondary | ICD-10-CM | POA: Insufficient documentation

## 2022-06-16 DIAGNOSIS — R000 Tachycardia, unspecified: Secondary | ICD-10-CM | POA: Insufficient documentation

## 2022-06-16 DIAGNOSIS — F17210 Nicotine dependence, cigarettes, uncomplicated: Secondary | ICD-10-CM | POA: Diagnosis not present

## 2022-06-16 DIAGNOSIS — G4733 Obstructive sleep apnea (adult) (pediatric): Secondary | ICD-10-CM

## 2022-06-16 DIAGNOSIS — G4734 Idiopathic sleep related nonobstructive alveolar hypoventilation: Secondary | ICD-10-CM

## 2022-06-16 DIAGNOSIS — I428 Other cardiomyopathies: Secondary | ICD-10-CM | POA: Diagnosis not present

## 2022-06-16 DIAGNOSIS — Z8679 Personal history of other diseases of the circulatory system: Secondary | ICD-10-CM | POA: Insufficient documentation

## 2022-06-16 DIAGNOSIS — R400 Somnolence: Secondary | ICD-10-CM

## 2022-06-16 DIAGNOSIS — Z720 Tobacco use: Secondary | ICD-10-CM

## 2022-06-16 LAB — COMPREHENSIVE METABOLIC PANEL
ALT: 15 U/L (ref 0–44)
AST: 15 U/L (ref 15–41)
Albumin: 3.5 g/dL (ref 3.5–5.0)
Alkaline Phosphatase: 57 U/L (ref 38–126)
Anion gap: 7 (ref 5–15)
BUN: 11 mg/dL (ref 6–20)
CO2: 30 mmol/L (ref 22–32)
Calcium: 9 mg/dL (ref 8.9–10.3)
Chloride: 99 mmol/L (ref 98–111)
Creatinine, Ser: 0.89 mg/dL (ref 0.44–1.00)
GFR, Estimated: >60 mL/min (ref >60)
Glucose, Bld: 80 mg/dL (ref 70–99)
Potassium: 4.1 mmol/L (ref 3.5–5.1)
Sodium: 136 mmol/L (ref 135–145)
Total Bilirubin: 1.8 mg/dL — ABNORMAL HIGH (ref 0.3–1.2)
Total Protein: 6.7 g/dL (ref 6.5–8.1)

## 2022-06-16 LAB — TSH: TSH: 3.034 u[IU]/mL (ref 0.350–4.500)

## 2022-06-16 LAB — BRAIN NATRIURETIC PEPTIDE: B Natriuretic Peptide: 22.2 pg/mL (ref 0.0–100.0)

## 2022-06-16 MED ORDER — EMPAGLIFLOZIN 10 MG PO TABS: 10.0000 mg | ORAL_TABLET | Freq: Every day | ORAL | 3 refills | Status: DC

## 2022-06-16 NOTE — Telephone Encounter (Signed)
Called to confirm Heart & Vascular Transitions of Care appointment at [place] today. Patient reminded to bring all medications and pill box organizer with them. Confirmed patient has transportation. Gave directions, instructed to utilize [place] parking.  Confirmed appointment prior to ending call.   Pricilla Holm, MSN, RN Heart Failure Nurse Navigator

## 2022-06-16 NOTE — Patient Instructions (Signed)
Medication Changes:  START Jardiance 10 mg Daily  Lab Work:  Labs done today, your results will be available in MyChart, we will contact you for abnormal readings.  Testing/Procedures:  Your provider has recommended that you have a home sleep study.  We have provided you with the equipment in our office today. Please download the app and follow the instructions. YOUR PIN NUMBER IS: [id number]. Once you have completed the test you just dispose of the equipment, the information is automatically uploaded to [place] via blue-tooth technology. If your test is positive for sleep apnea and you need a home CPAP machine you will be contacted by Dr Theodosia Blender office [facility]) to set this up.  Your physician has requested that you have a cardiac MRI. Cardiac MRI uses a computer to create images of your heart as its beating, producing both still and moving pictures of your heart and major blood vessels. For further information please visit [email or web address] Please follow the instruction sheet given to you today for more information.  Referrals:  You have been referred to follow-up in our Advanced Heart Failure Clinic  Special Instructions // Education:  Do the following things EVERYDAY: Weigh yourself in the morning before breakfast. Write it down and keep it in a log. Take your medicines as prescribed Eat low salt foods--Limit salt (sodium) to 2000 mg per day.  Stay as active as you can everyday Limit all fluids for the day to less than 2 liters   Follow-Up in: 3-4 weeks  At the [facility], you and your health needs are our priority. We have a designated team specialized in the treatment of Heart Failure. This Care Team includes your primary Heart Failure Specialized Cardiologist (physician), Advanced Practice Providers (APPs- Physician Assistants and Nurse Practitioners), and Pharmacist who all work together to provide you with the care you need, when you need it.   You may see  any of the following providers on your designated Care Team at your next follow up:  Dr. Glori Bickers Dr. Loralie Champagne Dr. Roxana Hires, NP Lyda Jester, [place] [facility] [place], [place] Forestine Na, NP Audry Riles, PharmD   Please be sure to bring in all your medications bottles to every appointment.   Need to Contact us:  If you have any questions or concerns before your next appointment please send [place] a message through [place] or call our office at [phone].    TO LEAVE A MESSAGE FOR THE NURSE SELECT OPTION 2, PLEASE LEAVE A MESSAGE INCLUDING: YOUR NAME DATE OF BIRTH CALL BACK NUMBER REASON FOR CALL**this is important as we prioritize the call backs  YOU WILL RECEIVE A CALL BACK THE SAME DAY AS LONG AS YOU CALL BEFORE 4:00 PM

## 2022-06-16 NOTE — Addendum Note (Signed)
Encounter addended by: Joette Catching, PA-C on: 06/16/2022 4:39 PM  Actions taken: Clinical Note Signed

## 2022-06-16 NOTE — Progress Notes (Signed)
>  20 min spent educating patient on HF and medications, provided "Living Better with HF" booklet and reviewed in detail. All questions answered.    Height:  5'1"    Weight: 329 lbs BMI: 62.19  Today's Date: 06/16/22  STOP BANG RISK ASSESSMENT S (snore) Have you been told that you snore?     YES   T (tired) Are you often tired, fatigued, or sleepy during the day?   YES  O (obstruction) Do you stop breathing, choke, or gasp during sleep? NO   P (pressure) Do you have or are you being treated for high blood pressure? NO   B (BMI) Is your body index greater than 35 kg/m? YES   A (age) Are you 22 years old or older? NO   N (neck) Do you have a neck circumference greater than 16 inches?      G (gender) Are you a female? NO   TOTAL STOP/BANG "YES" ANSWERS 3                                                                       For Office Use Only              Procedure Order Form    YES to 3+ Stop Bang questions OR two clinical symptoms - patient qualifies for WatchPAT (CPT 95800)      Clinical Notes: Will consult Sleep Specialist and refer for management of therapy due to patient increased risk of Sleep Apnea. Ordering a sleep study due to the following two clinical symptoms: Excessive daytime sleepiness G47.10 / Loud snoring R06.83

## 2022-06-17 ENCOUNTER — Ambulatory Visit (INDEPENDENT_AMBULATORY_CARE_PROVIDER_SITE_OTHER): Payer: BC Managed Care – PPO | Admitting: Nurse Practitioner

## 2022-06-17 ENCOUNTER — Telehealth (HOSPITAL_COMMUNITY): Payer: Self-pay | Admitting: *Deleted

## 2022-06-17 ENCOUNTER — Encounter: Payer: Self-pay | Admitting: Nurse Practitioner

## 2022-06-17 VITALS — BP 126/70 | HR 99 | Temp 98.1°F | Ht 61.0 in | Wt 321.0 lb

## 2022-06-17 DIAGNOSIS — J4541 Moderate persistent asthma with (acute) exacerbation: Secondary | ICD-10-CM | POA: Diagnosis not present

## 2022-06-17 DIAGNOSIS — Z6841 Body Mass Index (BMI) 40.0 and over, adult: Secondary | ICD-10-CM

## 2022-06-17 DIAGNOSIS — Z1211 Encounter for screening for malignant neoplasm of colon: Secondary | ICD-10-CM

## 2022-06-17 DIAGNOSIS — I5041 Acute combined systolic (congestive) and diastolic (congestive) heart failure: Secondary | ICD-10-CM | POA: Diagnosis not present

## 2022-06-17 DIAGNOSIS — Z720 Tobacco use: Secondary | ICD-10-CM

## 2022-06-17 DIAGNOSIS — E66813 Obesity, class 3: Secondary | ICD-10-CM

## 2022-06-17 NOTE — Progress Notes (Signed)
I,Tianna Badgett,acting as a Education administrator for [employer], FNP.,have documented all relevant documentation on the behalf of [name], FNP,as directed by  [name], FNP while in the presence of [name], [place].  Subjective:     Patient ID: April Ellis , female    DOB: 07/27/1976 , 46 y.o.   MRN: [id number]   Chief Complaint  Patient presents with   Hospitalization Follow-up    HPI  Patient presents today for HFU. [facility]-06/12/2022, she had been having shortness of breath for a day and thought she was having an asthma attack. She was diagnosed with congestive heart failure. She was started on Entresto, Lasix, Spironolactone and Jardiance. RVP was positive for rhinovirus and had positive sick contact -Day #5 for Zithromax today and prednisone, does not need any further antibiotics or steroids.  Her EF is 35%.   She is breathing better now. She has been to Cardiology yesterday.  She denies history of alcohol or illicit drug use.   She is having a difficult time adjusting to her new finding of congestive heart failure. She will provide her FMLA for her to be out of work until October 23rd.      Past Medical History:  Diagnosis Date   Anemia    Asthma    Bronchitis    COPD (chronic obstructive pulmonary disease) ([place])    Dyspnea    with asthma flair   Obesity    Respiratory arrest ([place]) 04/04/2020     Family History  Problem Relation Age of Onset   Diabetes Mother    Cancer Mother    Diabetes Sister    Hypertension Sister    Hypertension Sister    Urticaria Neg Hx    Immunodeficiency Neg Hx    Eczema Neg Hx    Asthma Neg Hx    Angioedema Neg Hx    Allergic rhinitis Neg Hx      Current Outpatient Medications:    albuterol (VENTOLIN HFA) 108 (90 Base) MCG/ACT inhaler, Inhale 2 puffs into the lungs every 4 (four) hours as needed for wheezing or shortness of breath. (Patient taking differently: Inhale 2 puffs into the lungs every 2 (two)  hours as needed for wheezing or shortness of breath.), Disp: 8.5 each, Rfl: 5   budesonide (PULMICORT) 1 MG/2ML nebulizer solution, TAKE 2 MLS (1 MG TOTAL) BY NEBULIZATION 4 (FOUR) TIMES DAILY AS NEEDED., Disp: 60 mL, Rfl: 1   budesonide-formoterol (SYMBICORT) 160-4.5 MCG/ACT inhaler, Take 2 puffs twice daily, Disp: 30.6 g, Rfl: 1   buPROPion (WELLBUTRIN XL) 150 MG 24 hr tablet, TAKE 1 TABLET BY MOUTH EVERY DAY IN THE MORNING, Disp: 90 tablet, Rfl: 1   cetirizine (ZYRTEC) 10 MG tablet, Take 10 mg by mouth daily as needed for allergies or rhinitis., Disp: , Rfl:    empagliflozin (JARDIANCE) 10 MG TABS tablet, Take 1 tablet (10 mg total) by mouth daily., Disp: 30 tablet, Rfl: 3   EPINEPHrine 0.3 mg/0.3 mL IJ SOAJ injection, Inject 0.3 mg into the muscle as needed for anaphylaxis., Disp: 2 each, Rfl: 5   furosemide (LASIX) 20 MG tablet, Take 1 tablet (20 mg total) by mouth daily., Disp: 30 tablet, Rfl: 4   ipratropium-albuterol (DUONEB) 0.5-2.5 (3) MG/3ML SOLN, INHALE 1 VIAL BY MOUTH VIA NEBULIZER EVERY 6 HOURS AS NEEDED (Patient taking differently: Take 3 mLs by nebulization every 2 (two) hours as needed (for shortness of breath or wheezing).), Disp: 1080 mL, Rfl: 5  montelukast (SINGULAIR) 10 MG tablet, Take 1 tablet (10 mg total) by mouth at bedtime. TAKE 1 TABLET BY MOUTH EVERYDAY AT BEDTIME Strength: 10 mg, Disp: 90 tablet, Rfl: 1   nicotine polacrilex (NICORETTE) 4 MG lozenge, Take 4 mg by mouth as needed for smoking cessation., Disp: , Rfl:    ondansetron (ZOFRAN) 4 MG tablet, Take 1 tablet (4 mg total) by mouth every 6 (six) hours. (Patient taking differently: Take 4 mg by mouth every 6 (six) hours as needed for nausea or vomiting.), Disp: 12 tablet, Rfl: 0   sacubitril-valsartan (ENTRESTO) 24-26 MG, Take 1 tablet by mouth 2 (two) times daily., Disp: 60 tablet, Rfl: 3   spironolactone (ALDACTONE) 25 MG tablet, Take 0.5 tablets (12.5 mg total) by mouth daily., Disp: 30 tablet, Rfl: 3   Allergies   Allergen Reactions   Penicillins Other (See Comments)    Childhood Allergy Did it involve swelling of the face/tongue/throat, SOB, or low BP? UNK Did it involve sudden or severe rash/hives, skin peeling, or any reaction on the inside of your mouth or nose? UNK Did you need to seek medical attention at a hospital or doctor's office? UNK When did it last happen?      more than 10 years If all above answers are "NO", may proceed with cephalosporin use.    Pineapple Other (See Comments)    Tested allergic to this     Review of Systems  Constitutional: Negative.   HENT: Negative.    Respiratory: Negative.    Cardiovascular: Negative.   Gastrointestinal: Negative.   Neurological: Negative.   Psychiatric/Behavioral: Negative.       Today's Vitals   06/17/22 1514  BP: 126/70  Pulse: 99  Temp: 98.1 F (36.7 C)  TempSrc: Oral  Weight: (!) 321 lb (145.6 kg)  Height: '5\' 1"'$  (1.549 m)   Body mass index is 60.65 kg/m.  Wt Readings from Last 3 Encounters:  06/17/22 (!) 321 lb (145.6 kg)  06/16/22 (!) 329 lb 2 oz (149.3 kg)  06/12/22 (!) 325 lb 6.4 oz (147.6 kg)    Objective:  Physical Exam Vitals reviewed.  Constitutional:      General: She is not in acute distress.    Appearance: Normal appearance. She is obese.  Cardiovascular:     Rate and Rhythm: Normal rate and regular rhythm.     Pulses: Normal pulses.     Heart sounds: Normal heart sounds. No murmur heard. Pulmonary:     Effort: Pulmonary effort is normal. No respiratory distress.     Breath sounds: Normal breath sounds. No wheezing.  Musculoskeletal:     Right lower leg: Edema (trace) present.     Left lower leg: Edema (trace) present.  Neurological:     General: No focal deficit present.     Mental Status: She is alert and oriented to person, place, and time. Mental status is at baseline.     Cranial Nerves: No cranial nerve deficit.     Motor: No weakness.  Psychiatric:        Mood and Affect: Mood normal.         Behavior: Behavior normal.        Thought Content: Thought content normal.        Judgment: Judgment normal.         Assessment And Plan:     1. Acute combined systolic and diastolic congestive heart failure (HCC) Comments: New diagnosis, she is being followed by Cardiology. She is doing  well with the medication.  TCM Performed. A member of the clinical team spoke with the patient upon dischare. Discharge summary was reviewed in full detail during the visit. Meds reconciled and compared to discharge meds. Medication list is updated and reviewed with the patient.  Greater than 50% face to face time was spent in counseling an coordination of care.  All questions were answered to the satisfaction of the patient.   - Amb ref to Medical Nutrition Therapy-MNT  2. Class 3 severe obesity due to excess calories without serious comorbidity with body mass index (BMI) of 60.0 to 69.9 in adult [facility]) Chronic Discussed healthy diet and regular exercise options  Encouraged to exercise at least 150 minutes per week with 2 days of strength training  She is encouraged to strive for BMI less than 30 to decrease cardiac risk.  - Amb ref to Medical Nutrition Therapy-MNT  3. Tobacco abuse Comments: Prescription for nicotine patches, continue wellbutrin. Smoking cessation instruction/counseling given:  counseled patient on the dangers of tobacco use, advised patient to stop smoking, and reviewed strategies to maximize success - Ambulatory referral to Smoking Cessation  4. Encounter for screening colonoscopy According to USPTF Colorectal cancer Screening guidelines. Colonoscopy is recommended every 10 years, starting at age 60 years. Will refer to GI for colon cancer screening. - Ambulatory referral to Gastroenterology  5. Moderate persistent asthma with acute exacerbation Doing well at this time. Continue follow up Pulmonology.     Patient was given opportunity to ask questions. Patient verbalized  understanding of the plan and was able to repeat key elements of the plan. All questions were answered to their satisfaction.  [name], FNP   I, [name], FNP, have reviewed all documentation for this visit. The documentation on 06/17/22 for the exam, diagnosis, procedures, and orders are all accurate and complete.   IF YOU HAVE BEEN REFERRED TO A SPECIALIST, IT MAY TAKE 1-2 WEEKS TO SCHEDULE/PROCESS THE REFERRAL. IF YOU HAVE NOT HEARD FROM US/SPECIALIST IN TWO WEEKS, PLEASE GIVE [place] A CALL AT [phone].   THE PATIENT IS ENCOURAGED TO PRACTICE SOCIAL DISTANCING DUE TO THE COVID-19 PANDEMIC.

## 2022-06-17 NOTE — Patient Instructions (Signed)
Living With Heart Failure Heart failure is a long-term (chronic) condition in which the heart cannot pump enough blood through the body. When this happens, parts of the body do not get the blood and oxygen they need. There is no cure for heart failure at this time, so it is important for you to take good care of yourself and follow the treatment plan you set with your health care provider. If you are living with heart failure, there are ways to help you manage the disease. How to manage lifestyle changes Living with heart failure requires you to make changes in your life. Your health care team will teach you about the changes you need to make in order to relieve your symptoms and lower your risk of going to the hospital. Work with your health care provider to develop a treatment plan that works for you. Activity Ask your health care provider about attending cardiac rehabilitation. These programs include aerobic physical activity, which provides many benefits for your heart. If no cardiac rehabilitation program is available, ask your health care provider what aerobic exercises are safe for you to do. Return to your normal activities as told by your health care provider. Ask your health care provider what activities are safe for you. Pace your daily activities and allow time for rest as needed. Managing stress It is normal to have many emotions about your diagnosis, such as fear, sadness, anger, and loss. If you feel any of these emotions and need help coping, contact your health care provider. Here are some ways to help yourself manage these emotions: Talk to friends and family members about your condition. They can give you support and guidance. Explain your symptoms to them and, if comfortable, invite them to attend appointments or rehabilitation with you. Join a support group for people with chronic heart failure. Talking with other people who have the same symptoms may give you new ways of coping  with your disease and your emotions. Accept help from others. Do not be ashamed if you need help with certain tasks. Use stress management techniques, such as meditation, breathing exercises, or listening to relaxing music. Conditions such as depression and anxiety are common in persons with heart failure. Pay attention to changes in your mood, emotions, and stress levels. Tell your health care provider if you have any of the following symptoms: Trouble sleeping or a change in your sleeping patterns. Feeling sad, down, or depressed more often than not, every day for more than 2 weeks. Losing interest in activities you normally enjoy. Feeling irritable or crying for no reason. Finding yourself worrying about the future often. Work You may need to develop a plan with your health care provider if heart failure interferes with your ability to work. This may include: Reducing your work hours. Finding functions that are less active or require less effort. Planning rest periods during your work hours. Travel Talk with your health care provider if you plan to travel. There may be circumstances in which your health care provider recommends that you do not travel or that you delay travel until your condition is under control. When you travel, bring your medicine and a list of your medicines. If you are traveling by air, keep your medicines with you in a carry-on bag. Consider finding a medical facility in the area you will be traveling to and determine what your health insurance will cover. If you will be traveling by public transportation (airplane, train, bus), contact the company prior to traveling  if you have special needs. This may include needs related to diet, oxygen, a wheelchair, a seating request, or help with luggage. If you use oxygen, make sure to bring enough oxygen with you. If you have a battery-powered device, bring a fully charged extra battery with you. If you have a device, bring a  note from your health care provider and inform all security screening personnel that you have the device. You may need to go through special screening for safety. Sexual activity  Ask your health care provider when it is safe for you to resume sexual activity. You may need to start slowly and gradually increase intimacy. You can increase intimacy by doing such things as caressing, touching, and holding each other. Get regular exercise as told by your health care provider. This can benefit your sex life by building strength and endurance. Sleep If your condition interferes with your sleep, find ways to improve your sleep quality, such as: Sleep lying on your side, or sleep with your head elevated by raising the head of your bed or using multiple pillows. Ask your health care provider about screening for sleep apnea. Try to go to sleep and wake up at the same times every day. Sleep in a dark, cool room. Do not do any physical activity or eating for a few hours before bedtime. Plan rest periods during the day, but do not take long naps during the day.  Where to find support Consider talking with: Family members. Close friends. A mental health professional or therapist. A member of your church, faith, or community group. Other sources of support include: Local support groups. Ask your health care provider about groups near you. Online support groups, such as those found through the American Heart Association: supportnetwork.heart.org Local home care agencies, community agencies, or social agencies. A palliative care specialist. Palliative care can help you manage symptoms, promote comfort, improve quality of life, and maintain dignity. Where to find more information American Heart Association: heart.org National Heart, Lung, and Blood Institute: https://www.hartman-hill.biz/ Centers for Disease Control and Prevention: Store[employer].com.cy [place]: hfsa.org Contact a health care provider  if: You have a rapid weight gain. You have increasing shortness of breath that is unusual for you. You are unable to participate in your usual physical activities. You tire easily. You have difficulty sleeping, such as: You wake up feeling short of breath. You have to use more pillows to raise your head in order to sleep. You cough more than normal, especially with physical activity. You have any swelling or more swelling in areas such as your hands, feet, ankles, or abdomen. You become dizzy or light-headed when you stand up. You have changes to your appetite. You have symptoms of depression or anxiety. Get help right away if: You have difficulty breathing. You notice or your family notices a change in your awareness, such as having trouble staying awake or having difficulty with concentration. You have pain or discomfort in your chest. You have an episode of fainting (syncope). You feel like your heart is beating quickly (palpitations). You have extreme feelings of sadness or loss of hope, or you have thoughts about hurting yourself or others. These symptoms may represent a serious problem that is an emergency. Do not wait to see if the symptoms will go away. Get medical help right away. Call your local emergency services (911 in the [place] Do not drive yourself to the hospital. Summary There is no cure for heart failure, so it is  important for you to take good care of yourself and follow the treatment plan set by your health care provider. Ask your health care provider about attending cardiac rehabilitation. These programs include aerobic physical activity, which provides many benefits for your heart. It is normal to have many emotions about your diagnosis, such as fear, sadness, anger, and loss. If you feel any of these emotions and need help coping, contact your health care provider. You may need to develop a plan with your health care provider if heart failure interferes with your  ability to work. This information is not intended to replace advice given to you by your health care provider. Make sure you discuss any questions you have with your health care provider. Document Revised: 12/22/2021 Document Reviewed: 04/28/2020 Elsevier Patient Education  [place].

## 2022-06-17 NOTE — Telephone Encounter (Signed)
CMRI approved

## 2022-06-21 ENCOUNTER — Inpatient Hospital Stay: Payer: BC Managed Care – PPO | Admitting: Nurse Practitioner

## 2022-06-21 ENCOUNTER — Encounter: Payer: Self-pay | Admitting: Nurse Practitioner

## 2022-06-26 ENCOUNTER — Emergency Department (HOSPITAL_COMMUNITY): Payer: BC Managed Care – PPO

## 2022-06-26 ENCOUNTER — Emergency Department (HOSPITAL_COMMUNITY)
Admission: EM | Admit: 2022-06-26 | Discharge: 2022-06-27 | Disposition: A | Discharge status: Home / Self Care | Payer: BC Managed Care – PPO | Attending: Emergency Medicine | Admitting: Emergency Medicine

## 2022-06-26 DIAGNOSIS — J4541 Moderate persistent asthma with (acute) exacerbation: Secondary | ICD-10-CM | POA: Insufficient documentation

## 2022-06-26 DIAGNOSIS — Z7951 Long term (current) use of inhaled steroids: Secondary | ICD-10-CM | POA: Insufficient documentation

## 2022-06-26 DIAGNOSIS — J449 Chronic obstructive pulmonary disease, unspecified: Secondary | ICD-10-CM | POA: Insufficient documentation

## 2022-06-26 DIAGNOSIS — Z7952 Long term (current) use of systemic steroids: Secondary | ICD-10-CM | POA: Insufficient documentation

## 2022-06-26 DIAGNOSIS — R0602 Shortness of breath: Secondary | ICD-10-CM | POA: Diagnosis not present

## 2022-06-26 DIAGNOSIS — R0902 Hypoxemia: Secondary | ICD-10-CM | POA: Diagnosis not present

## 2022-06-26 DIAGNOSIS — I509 Heart failure, unspecified: Secondary | ICD-10-CM | POA: Insufficient documentation

## 2022-06-26 LAB — BASIC METABOLIC PANEL
Anion gap: 9 (ref 5–15)
BUN: 8 mg/dL (ref 6–20)
CO2: 26 mmol/L (ref 22–32)
Calcium: 8.9 mg/dL (ref 8.9–10.3)
Chloride: 104 mmol/L (ref 98–111)
Creatinine, Ser: 0.95 mg/dL (ref 0.44–1.00)
GFR, Estimated: >60 mL/min (ref >60)
Glucose, Bld: 90 mg/dL (ref 70–99)
Potassium: 3.5 mmol/L (ref 3.5–5.1)
Sodium: 139 mmol/L (ref 135–145)

## 2022-06-26 LAB — CBC WITH DIFFERENTIAL/PLATELET
Abs Immature Granulocytes: 0.05 10*3/uL (ref 0.00–0.07)
Basophils Absolute: 0.1 10*3/uL (ref 0.0–0.1)
Basophils Relative: 1 %
Eosinophils Absolute: 0.3 10*3/uL (ref 0.0–0.5)
Eosinophils Relative: 4 %
HCT: 51.3 % — ABNORMAL HIGH (ref 36.0–46.0)
Hemoglobin: 16.2 g/dL — ABNORMAL HIGH (ref 12.0–15.0)
Immature Granulocytes: 1 %
Lymphocytes Relative: 22 %
Lymphs Abs: 1.9 10*3/uL (ref 0.7–4.0)
MCH: 29.5 pg (ref 26.0–34.0)
MCHC: 31.6 g/dL (ref 30.0–36.0)
MCV: 93.3 fL (ref 80.0–100.0)
Monocytes Absolute: 0.7 10*3/uL (ref 0.1–1.0)
Monocytes Relative: 8 %
Neutro Abs: 5.8 10*3/uL (ref 1.7–7.7)
Neutrophils Relative %: 64 %
Platelets: 174 10*3/uL (ref 150–400)
RBC: 5.5 MIL/uL — ABNORMAL HIGH (ref 3.87–5.11)
RDW: 12.6 % (ref 11.5–15.5)
WBC: 8.8 10*3/uL (ref 4.0–10.5)
nRBC: 0 % (ref 0.0–0.2)

## 2022-06-26 NOTE — ED Triage Notes (Signed)
Pt here from home via [facility] for sob. Pt has hx of asthma and recent diagnosis of CHF. SpO2 was 78% on pt's own duoneb. SpO2 86% on RA/ Pt received 2 SL nitro by EMS. Lung sounds improved w/ oxygen. Pt is on 10L NRB  w/ Ems. 154/91

## 2022-06-26 NOTE — ED Provider Triage Note (Signed)
Emergency Medicine Provider Triage Evaluation Note  April Ellis , a 46 y.o. female  was evaluated in triage.  Pt complains of shortness of breath.  Patient with history of asthma and had a recent diagnosis of CHF.  Patient already had an oxygen saturation in the 80% range when EMS arrived.  She had taken her own DuoNeb prior to EMS arrival.  EMS administered 2 sublingual nitro tablets.  Lung sounds improved during transport.  Patient was placed on 10 L/min nonrebreather with EMS.  At this time patient states she feels that she is breathing somewhat better.  She denies recent weight gain.  States she weighs yourself daily since her diagnosis 2 weeks ago.  States that she feels better after taking the DuoNeb.  Denies lower extremity swelling, chest pain.  Endorses shortness of breath  Review of Systems  Positive: As Above Negative: As above  Physical Exam  There were no vitals taken for this visit. Gen:   Awake, no distress   Resp:  Normal effort  MSK:   Moves extremities without difficulty  Other:    Medical Decision Making  Medically screening exam initiated at 9:18 PM.  Appropriate orders placed.  [name] was informed that the remainder of the evaluation will be completed by another provider, this initial triage assessment does not replace that evaluation, and the importance of remaining in the ED until their evaluation is complete.     Dorothyann Peng, PA-C 06/26/22 2121

## 2022-06-27 ENCOUNTER — Encounter (HOSPITAL_COMMUNITY): Payer: Self-pay

## 2022-06-27 LAB — BRAIN NATRIURETIC PEPTIDE: B Natriuretic Peptide: 18.1 pg/mL (ref 0.0–100.0)

## 2022-06-27 MED ORDER — IPRATROPIUM-ALBUTEROL 0.5-2.5 (3) MG/3ML IN SOLN
3.0000 mL | Freq: Once | RESPIRATORY_TRACT | Status: AC
  Administered 2022-06-27: 3 mL via RESPIRATORY_TRACT
  Filled 2022-06-27: qty 3

## 2022-06-27 MED ORDER — METHYLPREDNISOLONE SODIUM SUCC 125 MG IJ SOLR
125.0000 mg | Freq: Once | INTRAMUSCULAR | Status: AC
  Administered 2022-06-27: 125 mg via INTRAVENOUS
  Filled 2022-06-27: qty 2

## 2022-06-27 MED ORDER — PREDNISONE 10 MG PO TABS: 40.0000 mg | ORAL_TABLET | Freq: Every day | ORAL | 0 refills | Status: AC

## 2022-06-27 NOTE — ED Notes (Signed)
Ambulatory pulse oximetry 96-98%.

## 2022-06-27 NOTE — ED Provider Notes (Signed)
[facility] DEPARTMENT Provider Note   CSN: [id number] Arrival date & time: 06/26/22  2102     History  Chief Complaint  Patient presents with   Shortness of Breath    April Ellis is a 46 y.o. female.  46 year old female with history of CHF (EF 35% on 06/09/22 echo), COPD/asthma, dilated cardiomyopathy, presenting with a chief complaint of shortness of breath. Patient states her symptoms started yesterday at 6:00pm. She states it felt like her asthma attacks in the past. She reports she tried her Albuterol and Duoneb at home with no improvement in her symptoms. Found to be hypoxic by EMS with O2 sats in the 80%s, given 2 SL nitro, placed on O2 and brought to the ER. She denies fever, chest pain, edema, n/v/d/.   Recently admitted to the hospital about 2 weeks ago with new onset heart failure, states she is doing well in this regard, monitoring weights with no notable gains recently. Is not feeling this sick today, feels more like her typical asthma exacerbations. Denies any lower extremity edema. Is compliant with meds, feels like her Symbicort makes her throat itch and intends on following up with her prescriber for this.        Home Medications Prior to Admission medications   Medication Sig Start Date End Date Taking? Authorizing Provider  predniSONE (DELTASONE) 10 MG tablet Take 4 tablets (40 mg total) by mouth daily for 5 days. 06/27/22 07/02/22 Yes Tacy Learn, PA-C  albuterol (VENTOLIN HFA) 108 (90 Base) MCG/ACT inhaler Inhale 2 puffs into the lungs every 4 (four) hours as needed for wheezing or shortness of breath. Patient taking differently: Inhale 2 puffs into the lungs every 2 (two) hours as needed for wheezing or shortness of breath. 04/21/22   [name], FNP  budesonide (PULMICORT) 1 MG/2ML nebulizer solution TAKE 2 MLS (1 MG TOTAL) BY NEBULIZATION 4 (FOUR) TIMES DAILY AS NEEDED. 09/22/21   [name], FNP  budesonide-formoterol  [facility]) 160-4.5 MCG/ACT inhaler Take 2 puffs twice daily 04/21/22   [name], FNP  buPROPion (WELLBUTRIN XL) 150 MG 24 hr tablet TAKE 1 TABLET BY MOUTH EVERY DAY IN THE MORNING 04/21/22   [name], FNP  cetirizine (ZYRTEC) 10 MG tablet Take 10 mg by mouth daily as needed for allergies or rhinitis.    [provider]  empagliflozin (JARDIANCE) 10 MG TABS tablet Take 1 tablet (10 mg total) by mouth daily. 06/16/22   Joette Catching, PA-C  EPINEPHrine 0.3 mg/0.3 mL IJ SOAJ injection Inject 0.3 mg into the muscle as needed for anaphylaxis. 08/25/21   [name], FNP  furosemide (LASIX) 20 MG tablet Take 1 tablet (20 mg total) by mouth daily. 06/12/22 11/09/22  Rai, Ripudeep K, MD  ipratropium-albuterol (DUONEB) 0.5-2.5 (3) MG/3ML SOLN INHALE 1 VIAL BY MOUTH VIA NEBULIZER EVERY 6 HOURS AS NEEDED Patient taking differently: Take 3 mLs by nebulization every 2 (two) hours as needed (for shortness of breath or wheezing). 04/21/22   [name], FNP  montelukast (SINGULAIR) 10 MG tablet Take 1 tablet (10 mg total) by mouth at bedtime. TAKE 1 TABLET BY MOUTH EVERYDAY AT BEDTIME Strength: 10 mg 04/21/22   [name], FNP  nicotine polacrilex (NICORETTE) 4 MG lozenge Take 4 mg by mouth as needed for smoking cessation.    [provider]  ondansetron (ZOFRAN) 4 MG tablet Take 1 tablet (4 mg total) by mouth every 6 (six) hours. Patient taking differently: Take 4  mg by mouth every 6 (six) hours as needed for nausea or vomiting. 05/01/22   Elgie Congo, MD  sacubitril-valsartan (ENTRESTO) 24-26 MG Take 1 tablet by mouth 2 (two) times daily. 06/12/22   Rai, Vernelle Emerald, MD  spironolactone (ALDACTONE) 25 MG tablet Take 0.5 tablets (12.5 mg total) by mouth daily. 06/12/22   Rai, Vernelle Emerald, MD      Allergies    Penicillins and Pineapple    Review of Systems   Review of Systems Negative except as per HPI Physical Exam Updated Vital Signs BP 132/88   Pulse 85   Temp 97.7  F (36.5 C)   Resp 15   SpO2 99%  Physical Exam Vitals and nursing note reviewed.  Constitutional:      General: She is not in acute distress.    Appearance: She is well-developed. She is not diaphoretic.  HENT:     Head: Normocephalic and atraumatic.     Mouth/Throat:     Mouth: Mucous membranes are moist.     Pharynx: Oropharynx is clear. No pharyngeal swelling or oropharyngeal exudate.  Cardiovascular:     Rate and Rhythm: Normal rate and regular rhythm.     Pulses: Normal pulses.  Pulmonary:     Effort: Pulmonary effort is normal.     Breath sounds: Wheezing present.  Musculoskeletal:     Cervical back: Neck supple.     Right lower leg: No tenderness. No edema.     Left lower leg: No tenderness. No edema.  Skin:    General: Skin is warm and dry.  Neurological:     Mental Status: She is alert and oriented to person, place, and time.  Psychiatric:        Behavior: Behavior normal.     ED Results / Procedures / Treatments   Labs (all labs ordered are listed, but only abnormal results are displayed) Labs Reviewed  CBC WITH DIFFERENTIAL/PLATELET - Abnormal; Notable for the following components:      Result Value   RBC 5.50 (*)    Hemoglobin 16.2 (*)    HCT 51.3 (*)    All other components within normal limits  BASIC METABOLIC PANEL  BRAIN NATRIURETIC PEPTIDE    EKG None  Radiology DG Chest 2 View  Result Date: 06/26/2022 CLINICAL DATA:  Shortness of breath EXAM: CHEST - 2 VIEW COMPARISON:  Radiographs 06/08/2022 FINDINGS: No focal consolidation, pleural effusion, or pneumothorax. Normal cardiomediastinal silhouette. No acute osseous abnormality. IMPRESSION: No active cardiopulmonary disease. Electronically Signed   By: Placido Sou M.D.   On: 06/26/2022 22:07    Procedures Procedures    Medications Ordered in ED Medications  methylPREDNISolone sodium succinate (SOLU-MEDROL) 125 mg/2 mL injection 125 mg (125 mg Intravenous Given 06/27/22 0758)   ipratropium-albuterol (DUONEB) 0.5-2.5 (3) MG/3ML nebulizer solution 3 mL (3 mLs Nebulization Given 06/27/22 0757)    ED Course/ Medical Decision Making/ A&P                           Medical Decision Making Risk Prescription drug management.   This patient presents to the ED for concern of wheezing, SHOB, this involves an extensive number of treatment options, and is a complaint that carries with it a high risk of complications and morbidity.  The differential diagnosis includes but not limited to asthma exacerbation, CHF exacerbation, PNA   Co morbidities that complicate the patient evaluation  Dilated cardiomyopathy, CHF with EF of 35%  on recent echo, COPD, asthma   Additional history obtained:  Additional history obtained from EMS who reported to triage O2 sats in the 80s on arrival, treated with nitro and oxygen External records from outside source obtained and reviewed including echo report, cath report, discharge summary from recent hospitalization with admission from 06/08/2022 to 06/12/2022   Lab Tests:  I Ordered, and personally interpreted labs.  The pertinent results include: CBC with slightly elevated hemoglobin hematocrit, RBC.  BNP within normal limits, BMP within normal notes.   Imaging Studies ordered:  I ordered imaging studies including chest x-ray I independently visualized and interpreted imaging which showed no acute process I agree with the radiologist interpretation   Cardiac Monitoring: / EKG:  The patient was maintained on a cardiac monitor.  I personally viewed and interpreted the cardiac monitored which showed an underlying rhythm of: Normal sinus rhythm, rate 98   Problem List / ED Course / Critical interventions / Medication management  46 year old female brought in by EMS with wheezing and SHOB, found to be hypoxic with O2 sat in the 80s. After prolonged wait in the lobby, patient was feeling better, on 3L  with mild diffuse wheezing on my  exam, no lower extremity edema, no complaints of weight gain or CP. She was slowly weaned off supplemental O2 to room air, given a duoneb and solumedrol and improved, able to hold room air O2 sats at rest and with ambulation in the upper 90s. Her work up is reassuring including EKG, CXR, labs. Suspect asthma exacerbation, will dc with short course of prednisone (no history of DM). Recommend close follow up with PCP and heart failure clinic this week with strict return to ER precautions.  I ordered medication including DuoNeb, Solu-Medrol for wheezing Reevaluation of the patient after these medicines showed that the patient resolved I have reviewed the patients home medicines and have made adjustments as needed   Social Determinants of Health:  Lives at home   Test / Admission - Considered:  Patient monitored throughout her stay in the emergency room, given DuoNeb with improvement in wheezing.  Able to maintain O2 sats greater than 90% without supplemental O2.  Felt safe for discharge with return to ER precautions and plan to follow-up with her care team this week.         Final Clinical Impression(s) / ED Diagnoses Final diagnoses:  Moderate persistent asthma with exacerbation    Rx / DC Orders ED Discharge Orders          Ordered    predniSONE (DELTASONE) 10 MG tablet  Daily        06/27/22 0934              Tacy Learn, PA-C 06/27/22 [place], Homer, DO 06/27/22 1133

## 2022-06-27 NOTE — Discharge Instructions (Signed)
Likely asthma exacerbation, take prednisone as prescribed.  Follow up with your doctor for recheck in the next 1-2 days. Return to the ER at any time for worsening or concerning symptoms.

## 2022-07-01 ENCOUNTER — Encounter: Payer: BC Managed Care – PPO | Attending: Nurse Practitioner | Admitting: Dietician

## 2022-07-01 ENCOUNTER — Encounter: Payer: Self-pay | Admitting: Dietician

## 2022-07-01 DIAGNOSIS — Z713 Dietary counseling and surveillance: Secondary | ICD-10-CM | POA: Insufficient documentation

## 2022-07-01 DIAGNOSIS — E66813 Obesity, class 3: Secondary | ICD-10-CM

## 2022-07-01 DIAGNOSIS — Z6841 Body Mass Index (BMI) 40.0 and over, adult: Secondary | ICD-10-CM | POA: Insufficient documentation

## 2022-07-01 DIAGNOSIS — I5041 Acute combined systolic (congestive) and diastolic (congestive) heart failure: Secondary | ICD-10-CM | POA: Insufficient documentation

## 2022-07-01 NOTE — Patient Instructions (Addendum)
Aim for 150 minutes of physical activity weekly.  -consider starting to walk more -consider a gym membership  Consider having your vitamin D checked.   Eat more Non-Starchy Vegetables Make 1/2 of your plate fruits and/or vegetables as often as possible!  Minimize added sugars and refined grains Rethink what you drink. Choose beverages without added sugar. Look for 0 carbs on the label.  Choose whole foods over processed.   Make simple meals at home more often than eating out.  Aim to eat 3 times per day. Aim to eat within 2 hours of waking up, and every 3-5 hours following that.  -breakfast -lunch or balanced snack -dinner

## 2022-07-01 NOTE — Progress Notes (Signed)
Medical Nutrition Therapy  Appointment Start time:  [phone]  Appointment End time:  1715  Primary concerns today: Pt states she wants to learn how to eat healthier. She states she has family hx of diabetes and high blood pressure and wants to prevent this for herself.    Referral diagnosis: E66.01 Preferred learning style: no preference indicated Learning readiness: ready   NUTRITION ASSESSMENT   Anthropometrics  Ht: 61in Wt: 313.9lbs  Clinical Medical Hx: anemia, asthma, COPD.  Medications: reviewed Labs: reviewed Notable Signs/Symptoms: none Food Allergies: pineapple  Lifestyle & Dietary Hx  Pt lives with sister and son. Pt states her sister mostly cooks but sometimes she will cook. Sister does most of the grocery shopping.   Pt is working on quitting smoking.  Pt states she sees a therapist and is currently going through hard times but is working through it.   Pt states during the Covid-19 pandemic she would occasionally go a whole day without eating. She would just go from her room to the bathroom. Pt states now she sometimes still won't have strong hunger cues and will occasionally eat or skip breakfast but if she eats breakfast she may not eat again until dinner.   Estimated daily fluid intake: 64 oz Supplements: none  Sleep: 8 hours Stress / self-care: moderate stress Current average weekly physical activity: ADLs  24-Hr Dietary Recall First Meal: skips  Snack: 10am: grits with butter and sometimes bacon or sausage Second Meal: none Snack: honey bun OR hot honey cheetos Third Meal: meat with starch and vegetable OR wings and fries  Snack: none Beverages: water, occasional coffee, hot tea with honey, 1 ginger ale can daily.   NUTRITION DIAGNOSIS  NB-1.1 Food and nutrition-related knowledge deficit As related to E66.01.  As evidenced by pt report and diet hx.   NUTRITION INTERVENTION  Nutrition education (E-1) on the following topics:  Building balanced  snacks Importance of eating consistently Balance of carbohydrate, protein, fruit and non-starchy vegetables Saturated vs unsaturated fat Importance of physical activity Structured meal times  Handouts Provided Include  Dish Up a Healthy Meal MyPlacemat for Diabetes Snack Sheet  Learning Style & Readiness for Change Teaching method utilized: Visual & Auditory  Demonstrated degree of understanding via: Teach Back  Barriers to learning/adherence to lifestyle change: none  Goals Established by Pt Aim for 150 minutes of physical activity weekly.  -consider starting to walk more -consider a gym membership  Consider having your vitamin D checked.   Eat more Non-Starchy Vegetables Make 1/2 of your plate fruits and/or vegetables as often as possible!  Minimize added sugars and refined grains Rethink what you drink. Choose beverages without added sugar. Look for 0 carbs on the label.  Choose whole foods over processed.   Make simple meals at home more often than eating out.  Aim to eat 3 times per day. Aim to eat within 2 hours of waking up, and every 3-5 hours following that.  -breakfast -lunch or balanced snack -dinner   MONITORING & EVALUATION Dietary intake, weekly physical activity, and follow up in 2 months.  Next Steps  Patient is to call for question.

## 2022-07-02 ENCOUNTER — Telehealth: Payer: Self-pay

## 2022-07-02 NOTE — Telephone Encounter (Signed)
Call to pt reference next PREP class starting on 10/17 6p-715p T/TH at [facility] Confirmed she can start then.  Intake scheduled for 10/11 at 430p at [facility] meet her at front desk

## 2022-07-08 ENCOUNTER — Ambulatory Visit: Payer: BC Managed Care – PPO | Admitting: Nurse Practitioner

## 2022-07-08 NOTE — Progress Notes (Signed)
[facility] PREP Evaluation  Patient Details  Name: April Ellis MRN: [id number] Date of Birth: 09-01-1976 Age: 46 y.o. PCP: [name], FNP  Vitals:   07/07/22 1630  BP: 102/60  Pulse: (!) 101  SpO2: 95%  Weight: (!) 318 lb 12.8 oz (144.6 kg)     [facility] Eval - 07/08/22 1200       [facility] "PREP" Location   [facility] "PREP" Location [facility]      Referral    Referring Provider Moore    Reason for referral Inactivity;Obesitity/Overweight   CHF, asthma   Program Start Date 07/13/22   T/TH 6p-715p x 12 wks     Measurement   Waist Circumference 58 inches    Hip Circumference 61 inches    Body fat 49.6 percent      Information for Trainer   Goals get under 300 lbs, get into an exercise regimen    Current Exercise none    Orthopedic Concerns none    Pertinent Medical History DCM, CHF, Asthma    Current Barriers none    Medications that affect exercise Medication causing dizziness/drowsiness;Asthma inhaler      Timed Up and Go (TUGS)   Timed Up and Go Low risk <9 seconds      Mobility and Daily Activities   I find it easy to walk up or down two or more flights of stairs. 3    I have no trouble taking out the trash. 4    I do housework such as vacuuming and dusting on my own without difficulty. 4    I can easily lift a gallon of milk (8lbs). 4    I can easily walk a mile. 1    I have no trouble reaching into high cupboards or reaching down to pick up something from the floor. 4    I do not have trouble doing out-door work such as Armed forces logistics/support/administrative officer, raking leaves, or gardening. 2      Mobility and Daily Activities   I feel younger than my age. 4    I feel independent. 4    I feel energetic. 2    I live an active life.  1    I feel strong. 2    I feel healthy. 2    I feel active as other people my age. 1      How fit and strong are you.   Fit and Strong Total Score 38            Past Medical History:  Diagnosis Date   Anemia    Asthma    Bronchitis    COPD  (chronic obstructive pulmonary disease) ([place])    Dyspnea    with asthma flair   Obesity    Respiratory arrest ([place]) 04/04/2020   Past Surgical History:  Procedure Laterality Date   DENTAL SURGERY     2 teeth pulled, 11/22/14   DILITATION & CURRETTAGE/HYSTROSCOPY WITH NOVASURE ABLATION N/A 03/05/2020   Procedure: DILATATION & CURETTAGE/HYSTEROSCOPY WITH NOVASURE ABLATION;  Surgeon: Molli Posey, MD;  Location: [facility];  Service: Gynecology;  Laterality: N/A;   NO PAST SURGERIES     RIGHT/LEFT HEART CATH AND CORONARY ANGIOGRAPHY N/A 06/11/2022   Procedure: RIGHT/LEFT HEART CATH AND CORONARY ANGIOGRAPHY;  Surgeon: Early Osmond, MD;  Location: [facility];  Service: Cardiovascular;  Laterality: N/A;   Social History   Tobacco Use  Smoking Status Light Smoker   Packs/day: 0.25   Years:  15.00   Total pack years: 3.75   Types: Cigarettes   Last attempt to quit: 09/16/2014   Years since quitting: 7.8  Smokeless Tobacco Never  Tobacco Comments   she continues to smoke 1-2 cigarettes a day    Barnett Hatter 07/08/2022, 1:01 PM

## 2022-07-12 ENCOUNTER — Ambulatory Visit (INDEPENDENT_AMBULATORY_CARE_PROVIDER_SITE_OTHER): Payer: BC Managed Care – PPO | Admitting: Nurse Practitioner

## 2022-07-12 ENCOUNTER — Telehealth (HOSPITAL_COMMUNITY): Payer: Self-pay | Admitting: Emergency Medicine

## 2022-07-12 ENCOUNTER — Encounter: Payer: Self-pay | Admitting: Nurse Practitioner

## 2022-07-12 VITALS — BP 120/70 | HR 75 | Temp 97.4°F | Ht 61.0 in | Wt 312.8 lb

## 2022-07-12 DIAGNOSIS — J4540 Moderate persistent asthma, uncomplicated: Secondary | ICD-10-CM

## 2022-07-12 DIAGNOSIS — G4733 Obstructive sleep apnea (adult) (pediatric): Secondary | ICD-10-CM | POA: Diagnosis not present

## 2022-07-12 DIAGNOSIS — Z720 Tobacco use: Secondary | ICD-10-CM

## 2022-07-12 DIAGNOSIS — I5041 Acute combined systolic (congestive) and diastolic (congestive) heart failure: Secondary | ICD-10-CM

## 2022-07-12 MED ORDER — BUDESONIDE-FORMOTEROL FUMARATE 160-4.5 MCG/ACT IN AERO: INHALATION_SPRAY | RESPIRATORY_TRACT | 1 refills | Status: DC

## 2022-07-12 MED ORDER — EPINEPHRINE 0.3 MG/0.3ML IJ SOAJ: 0.3000 mg | INTRAMUSCULAR | 5 refills | Status: DC | PRN

## 2022-07-12 NOTE — Telephone Encounter (Signed)
Reaching out to patient to offer assistance regarding upcoming cardiac imaging study; pt verbalizes understanding of appt date/time, parking situation and where to check in, pre-test NPO status and medications ordered, and verified current allergies; name and call back number provided for further questions should they arise Marchia Bond RN Navigator Cardiac Imaging [facility] [phone] office [phone] cell  Denies claustro Denies metal implants Denies iv issues Arrival [address] entrance Holding diuretics

## 2022-07-12 NOTE — Progress Notes (Signed)
I,Tianna Badgett,acting as a Education administrator for [employer], FNP.,have documented all relevant documentation on the behalf of [name], FNP,as directed by  [name], FNP while in the presence of [name], [place].  Subjective:     Patient ID: April Ellis , female    DOB: 06-Feb-1976 , 46 y.o.   MRN: [id number]   Chief Complaint  Patient presents with   Follow-up    HPI  Patient presents today for ED follow up. She was seen in the ER for her asthma on 06/26/2022. She thought she was having an asthma attack and took Albuterol and Duoneb without effect. Upon arrival to the ER her sats were 80%.   She is taking symbicort and feels like her mouth starts to itch. While hospitalized she was on [facility]. She has not seen her asthma specialist, it has been about one year.   She has an appt with Cardiology on October 20th. She does not have any additional appts at this time.   She is going to see Dr. Brenton Grills (Cardiologist) in [place] for a second opinion. [facility] In [place], [place] in November.   Wt Readings from Last 3 Encounters: 07/12/22 : (!) 312 lb 12.8 oz (141.9 kg) 07/07/22 : (!) 318 lb 12.8 oz (144.6 kg) 07/01/22 : (!) 313 lb 14.4 oz (142.4 kg)       Past Medical History:  Diagnosis Date   Anemia    Asthma    Bronchitis    COPD (chronic obstructive pulmonary disease) ([place])    Dyspnea    with asthma flair   Obesity    Respiratory arrest ([place]) 04/04/2020     Family History  Problem Relation Age of Onset   Diabetes Mother    Cancer Mother    Diabetes Sister    Hypertension Sister    Hypertension Sister    Urticaria Neg Hx    Immunodeficiency Neg Hx    Eczema Neg Hx    Asthma Neg Hx    Angioedema Neg Hx    Allergic rhinitis Neg Hx      Current Outpatient Medications:    albuterol (VENTOLIN HFA) 108 (90 Base) MCG/ACT inhaler, Inhale 2 puffs into the lungs every 4 (four) hours as needed for wheezing or shortness of breath. (Patient taking  differently: Inhale 2 puffs into the lungs every 2 (two) hours as needed for wheezing or shortness of breath.), Disp: 8.5 each, Rfl: 5   buPROPion (WELLBUTRIN XL) 150 MG 24 hr tablet, TAKE 1 TABLET BY MOUTH EVERY DAY IN THE MORNING, Disp: 90 tablet, Rfl: 1   cetirizine (ZYRTEC) 10 MG tablet, Take 10 mg by mouth daily as needed for allergies or rhinitis., Disp: , Rfl:    empagliflozin (JARDIANCE) 10 MG TABS tablet, Take 1 tablet (10 mg total) by mouth daily., Disp: 30 tablet, Rfl: 3   furosemide (LASIX) 20 MG tablet, Take 1 tablet (20 mg total) by mouth daily., Disp: 30 tablet, Rfl: 4   ipratropium-albuterol (DUONEB) 0.5-2.5 (3) MG/3ML SOLN, INHALE 1 VIAL BY MOUTH VIA NEBULIZER EVERY 6 HOURS AS NEEDED (Patient taking differently: Take 3 mLs by nebulization every 2 (two) hours as needed (for shortness of breath or wheezing).), Disp: 1080 mL, Rfl: 5   montelukast (SINGULAIR) 10 MG tablet, Take 1 tablet (10 mg total) by mouth at bedtime. TAKE 1 TABLET BY MOUTH EVERYDAY AT BEDTIME Strength: 10 mg, Disp: 90 tablet, Rfl: 1   nicotine polacrilex (NICORETTE) 4 MG lozenge, Take 4  mg by mouth as needed for smoking cessation., Disp: , Rfl:    ondansetron (ZOFRAN) 4 MG tablet, Take 1 tablet (4 mg total) by mouth every 6 (six) hours. (Patient taking differently: Take 4 mg by mouth every 6 (six) hours as needed for nausea or vomiting.), Disp: 12 tablet, Rfl: 0   sacubitril-valsartan (ENTRESTO) 24-26 MG, Take 1 tablet by mouth 2 (two) times daily., Disp: 60 tablet, Rfl: 3   spironolactone (ALDACTONE) 25 MG tablet, Take 0.5 tablets (12.5 mg total) by mouth daily., Disp: 30 tablet, Rfl: 3   bisoprolol (ZEBETA) 5 MG tablet, Take 0.5 tablets (2.5 mg total) by mouth daily., Disp: 45 tablet, Rfl: 3   budesonide-formoterol (SYMBICORT) 160-4.5 MCG/ACT inhaler, Take 2 puffs twice daily, Disp: 30.6 g, Rfl: 1   EPINEPHrine 0.3 mg/0.3 mL IJ SOAJ injection, Inject 0.3 mg into the muscle as needed for anaphylaxis., Disp: 2 each,  Rfl: 5   Allergies  Allergen Reactions   Penicillins Other (See Comments)    Childhood Allergy Did it involve swelling of the face/tongue/throat, SOB, or low BP? UNK Did it involve sudden or severe rash/hives, skin peeling, or any reaction on the inside of your mouth or nose? UNK Did you need to seek medical attention at a hospital or doctor's office? UNK When did it last happen?      more than 10 years If all above answers are "NO", may proceed with cephalosporin use.    Pineapple Other (See Comments)    Tested allergic to this     Review of Systems  Constitutional: Negative.   Respiratory: Negative.    Cardiovascular: Negative.   Gastrointestinal: Negative.   Neurological: Negative.   Psychiatric/Behavioral: Negative.       Today's Vitals   07/12/22 1424  BP: 120/70  Pulse: 75  Temp: (!) 97.4 F (36.3 C)  TempSrc: Oral  Weight: (!) 312 lb 12.8 oz (141.9 kg)  Height: '5\' 1"'$  (1.549 m)   Body mass index is 59.1 kg/m.   Objective:  Physical Exam Vitals reviewed.  Constitutional:      General: She is not in acute distress.    Appearance: Normal appearance. She is obese.  Cardiovascular:     Rate and Rhythm: Normal rate and regular rhythm.     Pulses: Normal pulses.     Heart sounds: Normal heart sounds. No murmur heard. Pulmonary:     Effort: Pulmonary effort is normal. No respiratory distress.     Breath sounds: Normal breath sounds. No wheezing.  Musculoskeletal:     Right lower leg: No edema.     Left lower leg: No edema.  Neurological:     General: No focal deficit present.     Mental Status: She is alert and oriented to person, place, and time. Mental status is at baseline.     Cranial Nerves: No cranial nerve deficit.     Motor: No weakness.  Psychiatric:        Mood and Affect: Mood normal.        Behavior: Behavior normal.        Thought Content: Thought content normal.        Judgment: Judgment normal.         Assessment And Plan:     1.  Moderate persistent asthma without complication Comments: She had an ER visit for an asthma exacerbation. She is to f/u with Pulmonology. She is doing well today - budesonide-formoterol (SYMBICORT) 160-4.5 MCG/ACT inhaler; Take 2 puffs twice daily  Dispense: 30.6 g; Refill: 1  2. Obstructive sleep apnea Comments: She is to have a sleep study done by her Cardiologist  3. Acute combined systolic and diastolic congestive heart failure ([place]) Comments: Continue f/u with Cardiology and she is cleared to go back to work starting with 1/2 days for 2 weeks then regular hours  4. Tobacco abuse Comments: Given information for smoking cessation.      Patient was given opportunity to ask questions. Patient verbalized understanding of the plan and was able to repeat key elements of the plan. All questions were answered to their satisfaction.  [name], FNP   I, [name], FNP, have reviewed all documentation for this visit. The documentation on 07/12/22 for the exam, diagnosis, procedures, and orders are all accurate and complete.   IF YOU HAVE BEEN REFERRED TO A SPECIALIST, IT MAY TAKE 1-2 WEEKS TO SCHEDULE/PROCESS THE REFERRAL. IF YOU HAVE NOT HEARD FROM US/SPECIALIST IN TWO WEEKS, PLEASE GIVE [place] A CALL AT [phone] X [phone].   THE PATIENT IS ENCOURAGED TO PRACTICE SOCIAL DISTANCING DUE TO THE COVID-19 PANDEMIC.

## 2022-07-13 ENCOUNTER — Other Ambulatory Visit (HOSPITAL_COMMUNITY): Payer: Self-pay | Admitting: Physician Assistant

## 2022-07-13 ENCOUNTER — Ambulatory Visit (HOSPITAL_COMMUNITY)
Admission: RE | Admit: 2022-07-13 | Discharge: 2022-07-13 | Disposition: A | Discharge status: Home / Self Care | Payer: BC Managed Care – PPO | Source: Ambulatory Visit | Attending: Physician Assistant | Admitting: Physician Assistant

## 2022-07-13 DIAGNOSIS — I5022 Chronic systolic (congestive) heart failure: Secondary | ICD-10-CM | POA: Insufficient documentation

## 2022-07-13 DIAGNOSIS — I428 Other cardiomyopathies: Secondary | ICD-10-CM | POA: Insufficient documentation

## 2022-07-13 MED ORDER — GADOBUTROL 1 MMOL/ML IV SOLN
17.0000 mL | Freq: Once | INTRAVENOUS | Status: AC | PRN
  Administered 2022-07-13: 17 mL via INTRAVENOUS

## 2022-07-14 ENCOUNTER — Encounter (HOSPITAL_COMMUNITY): Payer: BC Managed Care – PPO | Admitting: Cardiology

## 2022-07-14 ENCOUNTER — Encounter: Payer: Self-pay | Admitting: Nurse Practitioner

## 2022-07-16 ENCOUNTER — Encounter (HOSPITAL_BASED_OUTPATIENT_CLINIC_OR_DEPARTMENT_OTHER): Payer: BC Managed Care – PPO | Admitting: Cardiology

## 2022-07-16 ENCOUNTER — Ambulatory Visit (HOSPITAL_COMMUNITY)
Admission: RE | Admit: 2022-07-16 | Discharge: 2022-07-16 | Disposition: A | Discharge status: Home / Self Care | Payer: BC Managed Care – PPO | Source: Ambulatory Visit | Attending: Cardiology | Admitting: Cardiology

## 2022-07-16 VITALS — BP 120/80 | HR 94 | Wt 317.0 lb

## 2022-07-16 DIAGNOSIS — G4733 Obstructive sleep apnea (adult) (pediatric): Secondary | ICD-10-CM | POA: Diagnosis not present

## 2022-07-16 DIAGNOSIS — I428 Other cardiomyopathies: Secondary | ICD-10-CM | POA: Diagnosis not present

## 2022-07-16 DIAGNOSIS — I5022 Chronic systolic (congestive) heart failure: Secondary | ICD-10-CM

## 2022-07-16 DIAGNOSIS — G4734 Idiopathic sleep related nonobstructive alveolar hypoventilation: Secondary | ICD-10-CM

## 2022-07-16 DIAGNOSIS — Z720 Tobacco use: Secondary | ICD-10-CM | POA: Diagnosis not present

## 2022-07-16 DIAGNOSIS — Z6841 Body Mass Index (BMI) 40.0 and over, adult: Secondary | ICD-10-CM

## 2022-07-16 LAB — BASIC METABOLIC PANEL
Anion gap: 12 (ref 5–15)
BUN: 11 mg/dL (ref 6–20)
CO2: 29 mmol/L (ref 22–32)
Calcium: 9.1 mg/dL (ref 8.9–10.3)
Chloride: 100 mmol/L (ref 98–111)
Creatinine, Ser: 1.03 mg/dL — ABNORMAL HIGH (ref 0.44–1.00)
GFR, Estimated: >60 mL/min (ref >60)
Glucose, Bld: 80 mg/dL (ref 70–99)
Potassium: 3.9 mmol/L (ref 3.5–5.1)
Sodium: 141 mmol/L (ref 135–145)

## 2022-07-16 LAB — BRAIN NATRIURETIC PEPTIDE: B Natriuretic Peptide: 21.2 pg/mL (ref 0.0–100.0)

## 2022-07-16 MED ORDER — BISOPROLOL FUMARATE 5 MG PO TABS: 2.5000 mg | ORAL_TABLET | Freq: Every day | ORAL | 3 refills | Status: DC

## 2022-07-16 NOTE — Patient Instructions (Signed)
Start Bisoprolol 2.'5mg'$  daily.  Labs done today, your results will be available in MyChart, we will contact you for abnormal readings.  Your physician recommends that you schedule a follow-up appointment in: 3 months.  If you have any questions or concerns before your next appointment please send [place] a message through [place] or call our office at [phone].    TO LEAVE A MESSAGE FOR THE NURSE SELECT OPTION 2, PLEASE LEAVE A MESSAGE INCLUDING: YOUR NAME DATE OF BIRTH CALL BACK NUMBER REASON FOR CALL**this is important as we prioritize the call backs  YOU WILL RECEIVE A CALL BACK THE SAME DAY AS LONG AS YOU CALL BEFORE 4:00 PM   At the [facility], you and your health needs are our priority. As part of our continuing mission to provide you with exceptional heart care, we have created designated Provider Care Teams. These Care Teams include your primary Cardiologist (physician) and Advanced Practice Providers (APPs- Physician Assistants and Nurse Practitioners) who all work together to provide you with the care you need, when you need it.   You may see any of the following providers on your designated Care Team at your next follow up: Dr Glori Bickers Dr Loralie Champagne Dr. Roxana Hires, NP Lyda Jester, [place] [facility] [place], [place] Forestine Na, NP Audry Riles, PharmD   Please be sure to bring in all your medications bottles to every appointment.

## 2022-07-18 NOTE — Procedures (Signed)
Patient Information Study Date: 07/16/22 Patient Name: April Ellis Patient ID: [id number] Birth Date: 11/25/1975 Age: 46 Gender: Female BMI: 88.2 (W=328 lb, H=4' 3'') Referring Physician: Marlyce Huge, PA  TEST DESCRIPTION: Home sleep apnea testing was completed using the WatchPat, a Type 1 device, utilizing peripheral arterial tonometry (PAT), chest movement, actigraphy, pulse oximetry, pulse rate, body position and snore. AHI was calculated with apnea and hypopnea using valid sleep time as the denominator. RDI includes apneas, hypopneas, and RERAs. The data acquired and the scoring of sleep and all associated events were performed in accordance with the recommended standards and specifications as outlined in the AASM Manual for the Scoring of Sleep and Associated Events 2.2.0 (2015).   FINDINGS:   1. Mild Obstructive Sleep Apnea with AHI 12/hr.   2. No Central Sleep Apnea with pAHIc 0/hr.   3. Oxygen desaturations as low as 81%.   4. Mild to moderate snoring was present. O2 sats were < 88% for 76.2 min.   5. Total sleep time was 5 hrs and 19 min.   6. 32.5% of total sleep time was spent in REM sleep.   7. Normal sleep onset latency at 17 min.   8. Shortened REM sleep onset latency at 26 min.   9. Total awakenings were 5.  10. Arrhythmia detection:  None.  DIAGNOSIS: Mild Obstructive Sleep Apnea (G47.33) Nocturnal Hypoxemia  RECOMMENDATIONS:   1.  Clinical correlation of these findings is necessary.  The decision to treat obstructive sleep apnea (OSA) is usually based on the presence of apnea symptoms or the presence of associated medical conditions such as Hypertension, Congestive Heart Failure, Atrial Fibrillation or Obesity.  The most common symptoms of OSA are snoring, gasping for breath while sleeping, daytime sleepiness and fatigue.   2.  Initiating apnea therapy is recommended given the presence of symptoms and/or associated conditions. Recommend proceeding with one of  the following:     a.  Auto-CPAP therapy with a pressure range of 5-20cm H2O.     b.  An oral appliance (OA) that can be obtained from certain dentists with expertise in sleep medicine.  These are primarily of use in non-obese patients with mild and moderate disease.     c.  An ENT consultation which may be useful to look for specific causes of obstruction and possible treatment options.     d.  If patient is intolerant to PAP therapy, consider referral to ENT for evaluation for hypoglossal nerve stimulator.   3.  Close follow-up is necessary to ensure success with CPAP or oral appliance therapy for maximum benefit.  4.  A follow-up oximetry study on CPAP is recommended to assess the adequacy of therapy and determine the need for supplemental oxygen or the potential need for Bi-level therapy.  An arterial blood gas to determine the adequacy of baseline ventilation and oxygenation should also be considered.  5.  Healthy sleep recommendations include:  adequate nightly sleep (normal 7-9 hrs/night), avoidance of caffeine after noon and alcohol near bedtime, and maintaining a sleep environment that is cool, dark and quiet.  6.  Weight loss for overweight patients is recommended.  Even modest amounts of weight loss can significantly improve the severity of sleep apnea.  7.  Snoring recommendations include:  weight loss where appropriate, side sleeping, and avoidance of alcohol before bed.  8.  Operation of motor vehicle should be avoided when sleepy.  Signature: Fransico Him, MD; [facility]; [place], [place] Board of Sleep Medicine Electronically Signed:  07/18/22 

## 2022-07-19 ENCOUNTER — Encounter: Payer: Self-pay | Admitting: Nurse Practitioner

## 2022-07-19 ENCOUNTER — Encounter (HOSPITAL_COMMUNITY): Payer: Self-pay | Admitting: Cardiology

## 2022-07-19 ENCOUNTER — Other Ambulatory Visit: Payer: BC Managed Care – PPO

## 2022-07-19 ENCOUNTER — Ambulatory Visit: Payer: BC Managed Care – PPO | Attending: Physician Assistant

## 2022-07-19 ENCOUNTER — Other Ambulatory Visit: Payer: Self-pay | Admitting: Nurse Practitioner

## 2022-07-19 DIAGNOSIS — I428 Other cardiomyopathies: Secondary | ICD-10-CM

## 2022-07-19 DIAGNOSIS — R400 Somnolence: Secondary | ICD-10-CM

## 2022-07-19 DIAGNOSIS — I5022 Chronic systolic (congestive) heart failure: Secondary | ICD-10-CM

## 2022-07-19 DIAGNOSIS — E7841 Elevated Lipoprotein(a): Secondary | ICD-10-CM

## 2022-07-19 DIAGNOSIS — G4734 Idiopathic sleep related nonobstructive alveolar hypoventilation: Secondary | ICD-10-CM

## 2022-07-19 DIAGNOSIS — G4733 Obstructive sleep apnea (adult) (pediatric): Secondary | ICD-10-CM

## 2022-07-19 NOTE — Progress Notes (Signed)
ADVANCED HEART FAILURE CLINIC NOTE  Referring Physician: [name], FNP  Primary Care: [name], FNP Primary Cardiologist:  HPI: April Ellis is a 46 y.o. female with asthma, hx of tobacco use, OSA not on CPAP and recently diagnosed systolic heart failure presenting today to establish care after being seen in [facility] clinic. April Ellis was admitted in September 2023 to [facility] for significant respiratory distress; at that time respiratory panel was positive for rhinovirus. TTE w/ LVEF of 35%. She was diuresed, started on GDMT and discharged. Since that time she has followed up at [facility] clinic where she was started on Jardiance.   Interval hx:  - Reports significant improvement in functional status; attempting to be more active for weight loss.  - Minimal dyspnea and LE edema.  Activity level/exercise tolerance:  II Orthopnea:  Sleeps on 2 pillows Paroxysmal noctural dyspnea:  No Chest pain/pressure:  No Orthostatic lightheadedness:  No Palpitations:  No Lower extremity edema:  No, improved now.  Presyncope/syncope:  no Cough:  minimal, improving.   Past Medical History:  Diagnosis Date   Anemia    Asthma    Bronchitis    COPD (chronic obstructive pulmonary disease) ([place])    Dyspnea    with asthma flair   Obesity    Respiratory arrest ([place]) 04/04/2020    Current Outpatient Medications  Medication Sig Dispense Refill   albuterol (VENTOLIN HFA) 108 (90 Base) MCG/ACT inhaler Inhale 2 puffs into the lungs every 4 (four) hours as needed for wheezing or shortness of breath. (Patient taking differently: Inhale 2 puffs into the lungs every 2 (two) hours as needed for wheezing or shortness of breath.) 8.5 each 5   bisoprolol (ZEBETA) 5 MG tablet Take 0.5 tablets (2.5 mg total) by mouth daily. 45 tablet 3   budesonide-formoterol (SYMBICORT) 160-4.5 MCG/ACT inhaler Take 2 puffs twice daily 30.6 g 1   buPROPion (WELLBUTRIN XL) 150 MG 24 hr tablet TAKE 1 TABLET BY MOUTH EVERY  DAY IN THE MORNING 90 tablet 1   cetirizine (ZYRTEC) 10 MG tablet Take 10 mg by mouth daily as needed for allergies or rhinitis.     empagliflozin (JARDIANCE) 10 MG TABS tablet Take 1 tablet (10 mg total) by mouth daily. 30 tablet 3   EPINEPHrine 0.3 mg/0.3 mL IJ SOAJ injection Inject 0.3 mg into the muscle as needed for anaphylaxis. 2 each 5   furosemide (LASIX) 20 MG tablet Take 1 tablet (20 mg total) by mouth daily. 30 tablet 4   ipratropium-albuterol (DUONEB) 0.5-2.5 (3) MG/3ML SOLN INHALE 1 VIAL BY MOUTH VIA NEBULIZER EVERY 6 HOURS AS NEEDED (Patient taking differently: Take 3 mLs by nebulization every 2 (two) hours as needed (for shortness of breath or wheezing).) 1080 mL 5   montelukast (SINGULAIR) 10 MG tablet Take 1 tablet (10 mg total) by mouth at bedtime. TAKE 1 TABLET BY MOUTH EVERYDAY AT BEDTIME Strength: 10 mg 90 tablet 1   nicotine polacrilex (NICORETTE) 4 MG lozenge Take 4 mg by mouth as needed for smoking cessation.     ondansetron (ZOFRAN) 4 MG tablet Take 1 tablet (4 mg total) by mouth every 6 (six) hours. (Patient taking differently: Take 4 mg by mouth every 6 (six) hours as needed for nausea or vomiting.) 12 tablet 0   sacubitril-valsartan (ENTRESTO) 24-26 MG Take 1 tablet by mouth 2 (two) times daily. 60 tablet 3   spironolactone (ALDACTONE) 25 MG tablet Take 0.5 tablets (12.5 mg total) by mouth daily. 30 tablet  3   No current facility-administered medications for this encounter.    Allergies  Allergen Reactions   Penicillins Other (See Comments)    Childhood Allergy Did it involve swelling of the face/tongue/throat, SOB, or low BP? UNK Did it involve sudden or severe rash/hives, skin peeling, or any reaction on the inside of your mouth or nose? UNK Did you need to seek medical attention at a hospital or doctor's office? UNK When did it last happen?      more than 10 years If all above answers are "NO", may proceed with cephalosporin use.    Pineapple Other (See  Comments)    Tested allergic to this      Social History   Socioeconomic History   Marital status: Single    Spouse name: Not on file   Number of children: Not on file   Years of education: Not on file   Highest education level: Not on file  Occupational History   Occupation: customer service rep  Tobacco Use   Smoking status: Light Smoker    Packs/day: 0.25    Years: 15.00    Total pack years: 3.75    Types: Cigarettes    Last attempt to quit: 09/16/2014    Years since quitting: 7.8   Smokeless tobacco: Never   Tobacco comments:    she continues to smoke 1-2 cigarettes a day  Vaping Use   Vaping Use: Never used  Substance and Sexual Activity   Alcohol use: Yes    Alcohol/week: 0.0 standard drinks of alcohol    Comment: occas.   Drug use: Yes    Types: Marijuana    Comment: occas.   Sexual activity: Not Currently  Other Topics Concern   Not on file  Social History Narrative   DIET: regular      DO YOU DRINK/EAT THINGS WITH CAFFEINE: Yes      MARITAL STATUS: Single      WHAT YEAR WERE YOU MARRIED:       DO YOU LIVE IN A HOUSE, APARTMENT, ASSISTED LIVING, CONDO TRAILER ETC.:  House       IS IT ONE OR MORE STORIES: 1      HOW MANY PERSONS LIVE IN YOUR HOME: 3      DO YOU HAVE PETS IN YOUR HOME: No      CURRENT OR PAST PROFESSION: customer Service Rep      DO YOU EXERCISE: very little      WHAT TYPE AND HOW OFTEN:   Social Determinants of Health   Financial Resource Strain: Not on file  Food Insecurity: No Food Insecurity (06/08/2022)   Hunger Vital Sign    Worried About Running Out of Food in the Last Year: Never true    Ran Out of Food in the Last Year: Never true  Transportation Needs: No Transportation Needs (06/08/2022)   PRAPARE - Hydrologist (Medical): No    Lack of Transportation (Non-Medical): No  Physical Activity: Not on file  Stress: Not on file  Social Connections: Not on file  Intimate Partner Violence:  Not At Risk (06/08/2022)   Humiliation, Afraid, Rape, and Kick questionnaire    Fear of Current or Ex-Partner: No    Emotionally Abused: No    Physically Abused: No    Sexually Abused: No      Family History  Problem Relation Age of Onset   Diabetes Mother    Cancer Mother    Diabetes Sister  Hypertension Sister    Hypertension Sister    Urticaria Neg Hx    Immunodeficiency Neg Hx    Eczema Neg Hx    Asthma Neg Hx    Angioedema Neg Hx    Allergic rhinitis Neg Hx     PHYSICAL EXAM: Vitals:   07/16/22 1124  BP: 120/80  Pulse: 94  SpO2: 95%   GENERAL: AAF sitting comfortably HEENT: Negative for arcus senilis or xanthelasma. There is no scleral icterus.  The mucous membranes are pink and moist.   NECK: Supple, No masses. Normal carotid upstrokes without bruits. No masses or thyromegaly.    CHEST: There are no chest wall deformities. There is no chest wall tenderness. Respirations are unlabored.  Lungs- CTA B/L CARDIAC:  JVP: difficult to assess, appears <10 cm H2O         Normal S1, S2  Normal rate with regular rhythm. No murmurs, rubs or gallops.  Pulses are 2+ and symmetrical in upper and lower extremities. No pitting edema.  ABDOMEN: Soft, non-tender, non-distended. There are no masses or hepatomegaly. There are normal bowel sounds.  EXTREMITIES: Warm and well perfused with no cyanosis, clubbing.  LYMPHATIC: No axillary or supraclavicular lymphadenopathy.  NEUROLOGIC: Patient is oriented x3 with no focal or lateralizing neurologic deficits.  PSYCH: Patients affect is appropriate, there is no evidence of anxiety or depression.  SKIN: Warm and dry; no lesions or wounds.   DATA REVIEW  ECG: 06/28/22: NSR  ECHO: 06/09/22: LVEF 35%, GII DD.   CATH: R/LHC, 06/11/22: 1.  Normal right dominant circulation. 2.  Fick cardiac output of 9.9 L/min, cardiac index of 4.2 L/min/m, with a mean RA pressure of 3 mmHg, mean wedge pressure of 5 mmHg, and LVEDP of 7 mmHg.  CMR:   07/13/22: mildly dilated LV w/ LVEF 44%, normal RV function, mild biatrial enlargement  ASSESSMENT & PLAN:  NYHA II, HFpEF/HFrEF, nonischemic cardiomyopathy Etiology of XT:[id number], HFpEF component likely secondary to obesity, sleep apnea, hx of tobacco use,  NYHA class / AHA Stage:II Volume status & Diuretics: Euvolemic, continue lasix '20mg'$  daily; instructed on uptitrating for weight gain Vasodilators:Entresto 24/'26mg'$  BID Beta-Blocker: start bisoprolol 2.'5mg'$  bid JME:[id number] 12.'5mg'$  daily Cardiometabolic:Jardiance '10mg'$  daily Devices therapies & Valvulopathies:not indicated at this time Advanced therapies:Not indicated at this time. CMR w/ improvement in LVEF to 45%. Will defer further management to general cardiology.   2. Obstructive sleep apnea - Discussed importance of CPAP compliance  3. Obesity - BMI 60 - Refer to lifestyle/weight loss clinic.   April Ellis Advanced Heart Failure Mechanical Circulatory Support

## 2022-07-20 LAB — LIPID PANEL
Chol/HDL Ratio: 1.7 ratio (ref 0.0–4.4)
Cholesterol, Total: 143 mg/dL (ref 100–199)
HDL: 85 mg/dL (ref >39)
LDL Chol Calc (NIH): 41 mg/dL (ref 0–99)
Triglycerides: 90 mg/dL (ref 0–149)
VLDL Cholesterol Cal: 17 mg/dL (ref 5–40)

## 2022-07-23 NOTE — Progress Notes (Signed)
[facility] PREP Weekly Session  Patient Details  Name: April Ellis MRN: [id number] Date of Birth: [birth year] Age: 46 y.o. PCP: [name], FNP  Vitals:   07/20/22 1800  Weight: (!) 312 lb (141.5 kg)     [facility] Weekly seesion - 07/23/22 1400       [facility] "PREP" Location   [facility] "PREP" [facility]      Weekly Session   Topic Discussed Importance of resistance training;Other ways to be active    Minutes exercised this week 70 minutes    Classes attended to date [place] 07/23/2022, 2:20 PM

## 2022-07-27 ENCOUNTER — Encounter (HOSPITAL_COMMUNITY): Payer: Self-pay | Admitting: Cardiology

## 2022-07-29 DIAGNOSIS — Z1211 Encounter for screening for malignant neoplasm of colon: Secondary | ICD-10-CM | POA: Diagnosis not present

## 2022-07-29 DIAGNOSIS — I5030 Unspecified diastolic (congestive) heart failure: Secondary | ICD-10-CM | POA: Diagnosis not present

## 2022-07-29 DIAGNOSIS — Z800 Family history of malignant neoplasm of digestive organs: Secondary | ICD-10-CM | POA: Diagnosis not present

## 2022-08-11 NOTE — Progress Notes (Signed)
[facility] PREP Weekly Session  Patient Details  Name: April Ellis MRN: [id number] Date of Birth: [birth year] Age: 46 y.o. PCP: [name], FNP  Vitals:   08/03/22 1800  Weight: (!) 311 lb (141.1 kg)     [facility] Weekly seesion - 08/11/22 1200       [facility] "PREP" Location   [facility]      Weekly Session   Topic Discussed Health habits    Minutes exercised this week 180 minutes    Classes attended to date 4            Late entry Barnett Hatter 08/11/2022, 12:17 PM

## 2022-08-15 ENCOUNTER — Observation Stay (HOSPITAL_COMMUNITY)
Admission: EM | Admit: 2022-08-15 | Discharge: 2022-08-16 | Disposition: A | Discharge status: Home / Self Care | Payer: BC Managed Care – PPO | Attending: Internal Medicine | Admitting: Internal Medicine

## 2022-08-15 ENCOUNTER — Encounter (HOSPITAL_COMMUNITY): Payer: Self-pay

## 2022-08-15 ENCOUNTER — Emergency Department (HOSPITAL_COMMUNITY): Payer: BC Managed Care – PPO

## 2022-08-15 ENCOUNTER — Other Ambulatory Visit: Payer: Self-pay

## 2022-08-15 DIAGNOSIS — D72829 Elevated white blood cell count, unspecified: Secondary | ICD-10-CM | POA: Diagnosis not present

## 2022-08-15 DIAGNOSIS — Z79899 Other long term (current) drug therapy: Secondary | ICD-10-CM | POA: Diagnosis not present

## 2022-08-15 DIAGNOSIS — J4551 Severe persistent asthma with (acute) exacerbation: Principal | ICD-10-CM | POA: Insufficient documentation

## 2022-08-15 DIAGNOSIS — I110 Hypertensive heart disease with heart failure: Secondary | ICD-10-CM | POA: Diagnosis not present

## 2022-08-15 DIAGNOSIS — I5042 Chronic combined systolic (congestive) and diastolic (congestive) heart failure: Secondary | ICD-10-CM | POA: Diagnosis not present

## 2022-08-15 DIAGNOSIS — J441 Chronic obstructive pulmonary disease with (acute) exacerbation: Secondary | ICD-10-CM | POA: Diagnosis not present

## 2022-08-15 DIAGNOSIS — Z1152 Encounter for screening for COVID-19: Secondary | ICD-10-CM | POA: Insufficient documentation

## 2022-08-15 DIAGNOSIS — I10 Essential (primary) hypertension: Secondary | ICD-10-CM | POA: Diagnosis present

## 2022-08-15 DIAGNOSIS — R000 Tachycardia, unspecified: Secondary | ICD-10-CM | POA: Diagnosis not present

## 2022-08-15 DIAGNOSIS — J9601 Acute respiratory failure with hypoxia: Secondary | ICD-10-CM | POA: Insufficient documentation

## 2022-08-15 DIAGNOSIS — R0602 Shortness of breath: Secondary | ICD-10-CM | POA: Diagnosis not present

## 2022-08-15 DIAGNOSIS — J4489 Other specified chronic obstructive pulmonary disease: Secondary | ICD-10-CM | POA: Diagnosis not present

## 2022-08-15 DIAGNOSIS — Z7984 Long term (current) use of oral hypoglycemic drugs: Secondary | ICD-10-CM | POA: Insufficient documentation

## 2022-08-15 DIAGNOSIS — R0603 Acute respiratory distress: Secondary | ICD-10-CM | POA: Diagnosis not present

## 2022-08-15 DIAGNOSIS — Z720 Tobacco use: Secondary | ICD-10-CM

## 2022-08-15 DIAGNOSIS — F17210 Nicotine dependence, cigarettes, uncomplicated: Secondary | ICD-10-CM | POA: Diagnosis not present

## 2022-08-15 DIAGNOSIS — G4734 Idiopathic sleep related nonobstructive alveolar hypoventilation: Secondary | ICD-10-CM

## 2022-08-15 DIAGNOSIS — F32A Depression, unspecified: Secondary | ICD-10-CM

## 2022-08-15 DIAGNOSIS — D751 Secondary polycythemia: Secondary | ICD-10-CM | POA: Insufficient documentation

## 2022-08-15 DIAGNOSIS — J45901 Unspecified asthma with (acute) exacerbation: Secondary | ICD-10-CM

## 2022-08-15 DIAGNOSIS — G4733 Obstructive sleep apnea (adult) (pediatric): Secondary | ICD-10-CM

## 2022-08-15 LAB — RESP PANEL BY RT-PCR (FLU A&B, COVID) ARPGX2
Influenza A by PCR: NEGATIVE
Influenza B by PCR: NEGATIVE
SARS Coronavirus 2 by RT PCR: NEGATIVE

## 2022-08-15 LAB — CBC WITH DIFFERENTIAL/PLATELET
Abs Immature Granulocytes: 0 10*3/uL (ref 0.00–0.07)
Basophils Absolute: 0 10*3/uL (ref 0.0–0.1)
Basophils Relative: 0 %
Eosinophils Absolute: 0.3 10*3/uL (ref 0.0–0.5)
Eosinophils Relative: 2 %
HCT: 50.8 % — ABNORMAL HIGH (ref 36.0–46.0)
Hemoglobin: 15.8 g/dL — ABNORMAL HIGH (ref 12.0–15.0)
Lymphocytes Relative: 28 %
Lymphs Abs: 4.2 10*3/uL — ABNORMAL HIGH (ref 0.7–4.0)
MCH: 30 pg (ref 26.0–34.0)
MCHC: 31.1 g/dL (ref 30.0–36.0)
MCV: 96.4 fL (ref 80.0–100.0)
Monocytes Absolute: 1.2 10*3/uL — ABNORMAL HIGH (ref 0.1–1.0)
Monocytes Relative: 8 %
Neutro Abs: 9.4 10*3/uL — ABNORMAL HIGH (ref 1.7–7.7)
Neutrophils Relative %: 62 %
Platelets: 260 10*3/uL (ref 150–400)
RBC: 5.27 MIL/uL — ABNORMAL HIGH (ref 3.87–5.11)
RDW: 13.9 % (ref 11.5–15.5)
WBC: 15.1 10*3/uL — ABNORMAL HIGH (ref 4.0–10.5)
nRBC: 0 % (ref 0.0–0.2)
nRBC: 1 /100 WBC — ABNORMAL HIGH

## 2022-08-15 LAB — I-STAT BETA HCG BLOOD, ED (MC, WL, AP ONLY): I-stat hCG, quantitative: <5 m[IU]/mL (ref <5)

## 2022-08-15 LAB — COMPREHENSIVE METABOLIC PANEL
ALT: 13 U/L (ref 0–44)
AST: 19 U/L (ref 15–41)
Albumin: 3.3 g/dL — ABNORMAL LOW (ref 3.5–5.0)
Alkaline Phosphatase: 94 U/L (ref 38–126)
Anion gap: 8 (ref 5–15)
BUN: 13 mg/dL (ref 6–20)
CO2: 26 mmol/L (ref 22–32)
Calcium: 8.5 mg/dL — ABNORMAL LOW (ref 8.9–10.3)
Chloride: 104 mmol/L (ref 98–111)
Creatinine, Ser: 0.99 mg/dL (ref 0.44–1.00)
GFR, Estimated: >60 mL/min (ref >60)
Glucose, Bld: 141 mg/dL — ABNORMAL HIGH (ref 70–99)
Potassium: 5.1 mmol/L (ref 3.5–5.1)
Sodium: 138 mmol/L (ref 135–145)
Total Bilirubin: 0.9 mg/dL (ref 0.3–1.2)
Total Protein: 6.8 g/dL (ref 6.5–8.1)

## 2022-08-15 LAB — TROPONIN I (HIGH SENSITIVITY)
Troponin I (High Sensitivity): 3 ng/L (ref <18)
Troponin I (High Sensitivity): 12 ng/L (ref <18)

## 2022-08-15 LAB — BRAIN NATRIURETIC PEPTIDE: B Natriuretic Peptide: 53 pg/mL (ref 0.0–100.0)

## 2022-08-15 MED ORDER — SACUBITRIL-VALSARTAN 24-26 MG PO TABS
1.0000 | ORAL_TABLET | Freq: Two times a day (BID) | ORAL | Status: DC
  Administered 2022-08-15 – 2022-08-16 (×2): 1 via ORAL
  Filled 2022-08-15 (×2): qty 1

## 2022-08-15 MED ORDER — ACETAMINOPHEN 650 MG RE SUPP: 650.0000 mg | Freq: Four times a day (QID) | RECTAL | Status: DC | PRN

## 2022-08-15 MED ORDER — PREDNISONE 20 MG PO TABS
40.0000 mg | ORAL_TABLET | Freq: Every day | ORAL | Status: DC
  Administered 2022-08-16: 40 mg via ORAL
  Filled 2022-08-15: qty 2

## 2022-08-15 MED ORDER — EMPAGLIFLOZIN 10 MG PO TABS
10.0000 mg | ORAL_TABLET | Freq: Every day | ORAL | Status: DC
  Administered 2022-08-15 – 2022-08-16 (×2): 10 mg via ORAL
  Filled 2022-08-15 (×2): qty 1

## 2022-08-15 MED ORDER — BUPROPION HCL ER (XL) 150 MG PO TB24
150.0000 mg | ORAL_TABLET | Freq: Every morning | ORAL | Status: DC
  Administered 2022-08-16: 150 mg via ORAL
  Filled 2022-08-15 (×2): qty 1

## 2022-08-15 MED ORDER — SODIUM CHLORIDE 0.9% FLUSH
3.0000 mL | Freq: Two times a day (BID) | INTRAVENOUS | Status: DC
  Administered 2022-08-15 (×2): 3 mL via INTRAVENOUS

## 2022-08-15 MED ORDER — ARFORMOTEROL TARTRATE 15 MCG/2ML IN NEBU
15.0000 ug | INHALATION_SOLUTION | Freq: Two times a day (BID) | RESPIRATORY_TRACT | Status: DC
  Administered 2022-08-15 – 2022-08-16 (×3): 15 ug via RESPIRATORY_TRACT
  Filled 2022-08-15 (×3): qty 2

## 2022-08-15 MED ORDER — IPRATROPIUM-ALBUTEROL 0.5-2.5 (3) MG/3ML IN SOLN
3.0000 mL | Freq: Four times a day (QID) | RESPIRATORY_TRACT | Status: DC
  Administered 2022-08-15 – 2022-08-16 (×4): 3 mL via RESPIRATORY_TRACT
  Filled 2022-08-15 (×4): qty 3

## 2022-08-15 MED ORDER — DOXYCYCLINE HYCLATE 100 MG PO TABS
100.0000 mg | ORAL_TABLET | Freq: Two times a day (BID) | ORAL | Status: DC
  Administered 2022-08-15 – 2022-08-16 (×3): 100 mg via ORAL
  Filled 2022-08-15 (×3): qty 1

## 2022-08-15 MED ORDER — GUAIFENESIN ER 600 MG PO TB12
600.0000 mg | ORAL_TABLET | Freq: Two times a day (BID) | ORAL | Status: DC
  Administered 2022-08-15 – 2022-08-16 (×3): 600 mg via ORAL
  Filled 2022-08-15 (×3): qty 1

## 2022-08-15 MED ORDER — ALBUTEROL SULFATE (2.5 MG/3ML) 0.083% IN NEBU
INHALATION_SOLUTION | RESPIRATORY_TRACT | Status: AC
  Administered 2022-08-15: 10 mg
  Filled 2022-08-15: qty 12

## 2022-08-15 MED ORDER — ALBUTEROL (5 MG/ML) CONTINUOUS INHALATION SOLN
10.0000 mg/h | INHALATION_SOLUTION | RESPIRATORY_TRACT | Status: DC
  Filled 2022-08-15: qty 20

## 2022-08-15 MED ORDER — BISOPROLOL FUMARATE 5 MG PO TABS
2.5000 mg | ORAL_TABLET | Freq: Every day | ORAL | Status: DC
  Administered 2022-08-15 – 2022-08-16 (×2): 2.5 mg via ORAL
  Filled 2022-08-15 (×2): qty 0.5

## 2022-08-15 MED ORDER — ACETAMINOPHEN 325 MG PO TABS: 650.0000 mg | ORAL_TABLET | Freq: Four times a day (QID) | ORAL | Status: DC | PRN

## 2022-08-15 MED ORDER — IPRATROPIUM BROMIDE 0.02 % IN SOLN
0.5000 mg | Freq: Once | RESPIRATORY_TRACT | Status: AC
  Administered 2022-08-15: 0.5 mg via RESPIRATORY_TRACT
  Filled 2022-08-15: qty 2.5

## 2022-08-15 MED ORDER — MONTELUKAST SODIUM 10 MG PO TABS
10.0000 mg | ORAL_TABLET | Freq: Every day | ORAL | Status: DC
  Administered 2022-08-15: 10 mg via ORAL
  Filled 2022-08-15: qty 1

## 2022-08-15 MED ORDER — LORATADINE 10 MG PO TABS
10.0000 mg | ORAL_TABLET | Freq: Every day | ORAL | Status: DC
  Administered 2022-08-15 – 2022-08-16 (×2): 10 mg via ORAL
  Filled 2022-08-15 (×2): qty 1

## 2022-08-15 MED ORDER — ALBUTEROL SULFATE (2.5 MG/3ML) 0.083% IN NEBU: 2.5000 mg | INHALATION_SOLUTION | RESPIRATORY_TRACT | Status: DC | PRN

## 2022-08-15 MED ORDER — ALBUTEROL (5 MG/ML) CONTINUOUS INHALATION SOLN
10.0000 mg/h | INHALATION_SOLUTION | Freq: Once | RESPIRATORY_TRACT | Status: DC
  Filled 2022-08-15: qty 20

## 2022-08-15 MED ORDER — ONDANSETRON HCL 4 MG PO TABS: 4.0000 mg | ORAL_TABLET | Freq: Four times a day (QID) | ORAL | Status: DC | PRN

## 2022-08-15 MED ORDER — FUROSEMIDE 20 MG PO TABS
20.0000 mg | ORAL_TABLET | Freq: Every day | ORAL | Status: DC
  Administered 2022-08-15 – 2022-08-16 (×2): 20 mg via ORAL
  Filled 2022-08-15 (×2): qty 1

## 2022-08-15 MED ORDER — ONDANSETRON HCL 4 MG/2ML IJ SOLN: 4.0000 mg | Freq: Four times a day (QID) | INTRAMUSCULAR | Status: DC | PRN

## 2022-08-15 MED ORDER — ENOXAPARIN SODIUM 80 MG/0.8ML IJ SOSY
70.0000 mg | PREFILLED_SYRINGE | INTRAMUSCULAR | Status: DC
  Administered 2022-08-15: 70 mg via SUBCUTANEOUS
  Filled 2022-08-15: qty 0.7

## 2022-08-15 MED ORDER — SPIRONOLACTONE 12.5 MG HALF TABLET
12.5000 mg | ORAL_TABLET | Freq: Every day | ORAL | Status: DC
  Administered 2022-08-15 – 2022-08-16 (×2): 12.5 mg via ORAL
  Filled 2022-08-15 (×2): qty 1

## 2022-08-15 MED ORDER — BUDESONIDE 0.5 MG/2ML IN SUSP
0.5000 mg | Freq: Two times a day (BID) | RESPIRATORY_TRACT | Status: DC
  Administered 2022-08-15 – 2022-08-16 (×3): 0.5 mg via RESPIRATORY_TRACT
  Filled 2022-08-15 (×3): qty 2

## 2022-08-15 NOTE — H&P (Signed)
History and Physical    Patient: April Ellis KGM:[id number] DOB: 03-Dec-1975 DOA: 08/15/2022 DOS: the patient was seen and examined on 08/15/2022 PCP: [name], FNP  Patient coming from: Home  Chief Complaint: Shortness of breath  HPI: April Ellis is a 46 y.o. 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  She has been wheezing, but has only had a mild intermittent cough.   Notes associated symptoms of some lower extremity swelling, but feels her weight has been relatively stable.  She had tried using home inhalers without any improvement in symptoms.  Denies any fever, chills,  chest pain, nausea, vomiting, abdominal pain, calf pain, or recent sick contacts.  She continues to smoke 1-4 cigarettes/day on average.  She had had a sleep study back in October, but had never heard anything back from the study.  Last hospitalized for acute respiratory failure with hypoxia secondary to asthma-COPD overlap syndrome with new onset CHF in September of this year.  She reports taking her medications including the diuretics as prescribed.  With EMS patient had been given 2 DuoNeb breathing treatments, Solu-Medrol 125 mg, magnesium sulfate 2 g, and 1 nitroglycerin.  In the emergency department patient was noted to be afebrile with tachypnea and tachycardia.  Patient had been placed on BiPAP initially due to work of breathing, but had been since able to be weaned to 2 L of nasal cannula oxygen with O2 saturations maintained.  Labs significant for WBC 15.1, hemoglobin 15.8, high-sensitivity troponin negative, and BNP 53.  Chest x-ray noted no acute abnormality.  Influenza and COVID-19 screening were negative.  Patient was given a continuous albuterol neb treatment and ipratropium neb.  TRH  called to admit.  Review of Systems: As mentioned in the history of present illness. All other systems reviewed and are negative. Past Medical History:  Diagnosis Date   Anemia    Asthma    Bronchitis    COPD (chronic obstructive pulmonary disease) ([place])    Dyspnea    with asthma flair   Obesity    Respiratory arrest ([place]) 04/04/2020   Past Surgical History:  Procedure Laterality Date   DENTAL SURGERY     2 teeth pulled, 11/22/14   DILITATION & CURRETTAGE/HYSTROSCOPY WITH NOVASURE ABLATION N/A 03/05/2020   Procedure: DILATATION & CURETTAGE/HYSTEROSCOPY WITH NOVASURE ABLATION;  Surgeon: Molli Posey, MD;  Location: [facility];  Service: Gynecology;  Laterality: N/A;   NO PAST SURGERIES     RIGHT/LEFT HEART CATH AND CORONARY ANGIOGRAPHY N/A 06/11/2022   Procedure: RIGHT/LEFT HEART CATH AND CORONARY ANGIOGRAPHY;  Surgeon: Early Osmond, MD;  Location: [facility];  Service: Cardiovascular;  Laterality: N/A;   Social History:  reports that she has been smoking cigarettes. She has a 3.75 pack-year smoking history. She has never used smokeless tobacco. She reports current alcohol use. She reports current drug use. Drug: Marijuana.  Allergies  Allergen Reactions   Penicillins Other (See Comments)    Childhood Allergy Did it involve swelling of the face/tongue/throat, SOB, or low BP? UNK Did it involve sudden or severe rash/hives, skin peeling, or any reaction on the inside of your mouth or nose? UNK Did you need to seek medical attention at a hospital or doctor's office? UNK When did it last happen?      more than  10 years If all above answers are "NO", may proceed with cephalosporin use.    Pineapple Other (See Comments)    Tested allergic to this    Family History  Problem Relation Age of Onset   Diabetes Mother    Cancer Mother    Diabetes Sister    Hypertension Sister    Hypertension Sister    Urticaria Neg Hx    Immunodeficiency Neg Hx    Eczema Neg Hx    Asthma  Neg Hx    Angioedema Neg Hx    Allergic rhinitis Neg Hx     Prior to Admission medications   Medication Sig Start Date End Date Taking? Authorizing Provider  albuterol (VENTOLIN HFA) 108 (90 Base) MCG/ACT inhaler Inhale 2 puffs into the lungs every 4 (four) hours as needed for wheezing or shortness of breath. Patient taking differently: Inhale 2 puffs into the lungs every 2 (two) hours as needed for wheezing or shortness of breath. 04/21/22   [name], FNP  bisoprolol (ZEBETA) 5 MG tablet Take 0.5 tablets (2.5 mg total) by mouth daily. 07/16/22   Sabharwal, Lindalou Hose, DO  budesonide-formoterol (SYMBICORT) 160-4.5 MCG/ACT inhaler Take 2 puffs twice daily 07/12/22   [name], FNP  buPROPion (WELLBUTRIN XL) 150 MG 24 hr tablet TAKE 1 TABLET BY MOUTH EVERY DAY IN THE MORNING 04/21/22   [name], FNP  cetirizine (ZYRTEC) 10 MG tablet Take 10 mg by mouth daily as needed for allergies or rhinitis.    [provider]  empagliflozin (JARDIANCE) 10 MG TABS tablet Take 1 tablet (10 mg total) by mouth daily. 06/16/22   Joette Catching, PA-C  EPINEPHrine 0.3 mg/0.3 mL IJ SOAJ injection Inject 0.3 mg into the muscle as needed for anaphylaxis. 07/12/22   [name], FNP  furosemide (LASIX) 20 MG tablet Take 1 tablet (20 mg total) by mouth daily. 06/12/22 11/09/22  Rai, Ripudeep K, MD  ipratropium-albuterol (DUONEB) 0.5-2.5 (3) MG/3ML SOLN INHALE 1 VIAL BY MOUTH VIA NEBULIZER EVERY 6 HOURS AS NEEDED Patient taking differently: Take 3 mLs by nebulization every 2 (two) hours as needed (for shortness of breath or wheezing). 04/21/22   [name], FNP  montelukast (SINGULAIR) 10 MG tablet Take 1 tablet (10 mg total) by mouth at bedtime. TAKE 1 TABLET BY MOUTH EVERYDAY AT BEDTIME Strength: 10 mg 04/21/22   [name], FNP  nicotine polacrilex (NICORETTE) 4 MG lozenge Take 4 mg by mouth as needed for smoking cessation.    [provider]  ondansetron (ZOFRAN) 4 MG tablet Take 1  tablet (4 mg total) by mouth every 6 (six) hours. Patient taking differently: Take 4 mg by mouth every 6 (six) hours as needed for nausea or vomiting. 05/01/22   Elgie Congo, MD  sacubitril-valsartan (ENTRESTO) 24-26 MG Take 1 tablet by mouth 2 (two) times daily. 06/12/22   Rai, Vernelle Emerald, MD  spironolactone (ALDACTONE) 25 MG tablet Take 0.5 tablets (12.5 mg total) by mouth daily. 06/12/22   Mendel Corning, MD    Physical Exam: Vitals:   08/15/22 0958 08/15/22 1000 08/15/22 1052 08/15/22 1109  BP:  123/84 (!) 122/96   Pulse:  (!) 116 (!) 105 (!) 110  Resp:  (!) '23 17 18  '$ Temp:      TempSrc:      SpO2: 100% 100% 100% 100%   Constitutional: Obese Middle-aged female currently in no acute distress Eyes: PERRL, lids and conjunctivae normal ENMT: Mucous membranes are moist Neck: normal, supple, no  JVD appreciated. Respiratory: Decreased overall aeration with expiratory wheezes appreciated.  Patient currently on 2 L of nasal cannula oxygen and noted to have O2 saturations dropped down temporarily to 88% while talking. Cardiovascular: Tachycardic.  Patient with trace lower extremity edema. Abdomen: Protuberant abdomen.  No tenderness, no masses palpated. Bowel sounds positive.  Musculoskeletal: no clubbing / cyanosis. No joint deformity upper and lower extremities. Good ROM, no contractures. Normal muscle tone.  Skin: no rashes, lesions, ulcers. No induration Neurologic: CN 2-12 grossly intact. Strength 5/5 in all 4.  Psychiatric: Normal judgment and insight. Alert and oriented x 3. Normal mood.   Data Reviewed:   EKG revealed sinus tachycardia at 117 bpm with right atrial enlargement.  Reviewed labs, imaging, and pertinent records as noted above in HPI.  Assessment and Plan:  Acute respiratory failure with hypoxia secondary to asthma with COPD with exacerbation asthma COPD overlap syndrome Patient presents with worsening shortness of breath with cough and wheezing.  Patient has  known asthma -COPD overlap syndrome.  Initially placed on BiPAP due to work of breathing, but able to be weaned to 2 L of nasal cannula oxygen.  Patient noted to have O2 saturations dropped down to 88% while talking briefly.  Chest x-ray without acute abnormality.  Patient has been followed by pulmonology previously in the past. -Admit to a telemetry bed -Continuous pulse oximetry with oxygen maintain O2 saturation greater than 92%.  Wean to room air when able -DuoNebs 4 times daily and albuterol nebs as needed for shortness of breath/wheezing -Brovana and budesonide nebs twice daily -Empiric antibiotics of doxycycline  -Prednisone 40 mg p.o. daily to start in a.m. -Continue Singulair and pharmacy substitution of Claritin.  Leukocytosis Acute.  WBC elevated 15.1.  Patient reported being on steroids prior to coming to the hospital.  Denies having any fever or chills.  -Recheck CBC tomorrow morning  Combined systolic and diastolic CHF Patient appears relatively euvolemic at this time.  No significant lower extremity swelling noted on physical exam.  BNP 53.  Last echocardiogram noted EF to be 35% with grade 2 diastolic dysfunction with RV contraction normal. -Strict I&O's and daily weights -Continue beta-blocker and diuretics  Polycythemia Chronic.  Hemoglobin 15.8 which appears similar to prior.  Possibly secondary to patient's history of OSA and/or tobacco use. -Continue to monitor  Essential hypertension Blood pressures currently maintained. -Continue current medication regimen  Depression -Continue Wellbutrin as also can help with smoking cessation  Tobacco abuse Patient still reports smoking 1-4 cigarettes/day on average. -Counseled on need of cessation of tobacco use and offered nicotine patch  Obstructive sleep apnea nocturnal hypoxemia Patient had a sleep study and report from 10/20 noted mild obstructive sleep apnea with nocturnal hypoxemia recommending CPAP.  Patient  reports not hearing anything out of the sleep study. -Transitions of care consulted for CPAP  Morbid obesity BMI previously noted to be 60.08 kg/m on 11/2. -Continue to counsel need of weight loss with exercise.  DVT prophylaxis: Lovenox  Advance Care Planning:   Code Status: Full Code   Consults: None  Family Communication: Called patient's sister and message left on her voicemail.  Severity of Illness: The appropriate patient status for this patient is OBSERVATION. Observation status is judged to be reasonable and necessary in order to provide the required intensity of service to ensure the patient's safety. The patient's presenting symptoms, physical exam findings, and initial radiographic and laboratory data in the context of their medical condition is felt to place them at  decreased risk for further clinical deterioration. Furthermore, it is anticipated that the patient will be medically stable for discharge from the hospital within 2 midnights of admission.   Author: Norval Morton, MD 08/15/2022 11:44 AM  For on call review [email or web address].

## 2022-08-15 NOTE — ED Notes (Signed)
RN gave pt warm blankets, call light within reach

## 2022-08-15 NOTE — ED Provider Notes (Signed)
[facility] EMERGENCY DEPARTMENT Provider Note   CSN: [id number] Arrival date & time: 08/15/22  0932     History  No chief complaint on file.   April Ellis is a 46 y.o. female.  HPI 46 year old female presents with respiratory distress.  History is from a combination the patient and the nurses spoke to EMS.  The patient has been feeling bad since yesterday.  She feels like she is having an asthma exacerbation.  She has been having shortness of breath since last night and then got a lot worse today.  Tried albuterol at home with no relief.  EMS gave her a couple breathing treatments, Solu-Medrol, magnesium and tried CPAP though she did not tolerate it.  She is currently on a nonrebreather.  She has also noticed increased weight gain and swelling despite compliance with her medicines and she has a history of known CHF.  Home Medications Prior to Admission medications   Medication Sig Start Date End Date Taking? Authorizing Provider  albuterol (VENTOLIN HFA) 108 (90 Base) MCG/ACT inhaler Inhale 2 puffs into the lungs every 4 (four) hours as needed for wheezing or shortness of breath. Patient taking differently: Inhale 2 puffs into the lungs every 2 (two) hours as needed for wheezing or shortness of breath. 04/21/22  Yes [name], FNP  bisoprolol (ZEBETA) 5 MG tablet Take 0.5 tablets (2.5 mg total) by mouth daily. 07/16/22  Yes Sabharwal, Aditya, DO  budesonide-formoterol (SYMBICORT) 160-4.5 MCG/ACT inhaler Take 2 puffs twice daily Patient taking differently: Inhale 2 puffs into the lungs 2 (two) times daily. 07/12/22  Yes [name], FNP  buPROPion (WELLBUTRIN XL) 150 MG 24 hr tablet TAKE 1 TABLET BY MOUTH EVERY DAY IN THE MORNING Patient taking differently: Take 150 mg by mouth in the morning. 04/21/22  Yes [name], FNP  cetirizine (ZYRTEC) 10 MG tablet Take 10 mg by mouth daily as needed for allergies or rhinitis.   Yes [provider]   empagliflozin (JARDIANCE) 10 MG TABS tablet Take 1 tablet (10 mg total) by mouth daily. 06/16/22  Yes Joette Catching, PA-C  EPINEPHrine 0.3 mg/0.3 mL IJ SOAJ injection Inject 0.3 mg into the muscle as needed for anaphylaxis. 07/12/22  Yes [name], FNP  furosemide (LASIX) 20 MG tablet Take 1 tablet (20 mg total) by mouth daily. 06/12/22 11/09/22 Yes Rai, Ripudeep K, MD  ipratropium-albuterol (DUONEB) 0.5-2.5 (3) MG/3ML SOLN INHALE 1 VIAL BY MOUTH VIA NEBULIZER EVERY 6 HOURS AS NEEDED Patient taking differently: Take 3 mLs by nebulization every 2 (two) hours as needed (for shortness of breath or wheezing). 04/21/22  Yes [name], FNP  montelukast (SINGULAIR) 10 MG tablet Take 1 tablet (10 mg total) by mouth at bedtime. TAKE 1 TABLET BY MOUTH EVERYDAY AT BEDTIME Strength: 10 mg 04/21/22  Yes [name], FNP  nicotine polacrilex (NICORETTE) 4 MG lozenge Take 4 mg by mouth as needed for smoking cessation.   Yes [provider]  sacubitril-valsartan (ENTRESTO) 24-26 MG Take 1 tablet by mouth 2 (two) times daily. 06/12/22  Yes Rai, Ripudeep K, MD  spironolactone (ALDACTONE) 25 MG tablet Take 0.5 tablets (12.5 mg total) by mouth daily. 06/12/22  Yes Rai, Ripudeep K, MD  ondansetron (ZOFRAN) 4 MG tablet Take 1 tablet (4 mg total) by mouth every 6 (six) hours. Patient not taking: Reported on 08/15/2022 05/01/22   Elgie Congo, MD      Allergies    Penicillins and Pineapple    Review  of Systems   Review of Systems  Constitutional:  Negative for fever.  Respiratory:  Positive for shortness of breath. Negative for cough.   Cardiovascular:  Positive for leg swelling. Negative for chest pain.    Physical Exam Updated Vital Signs BP (!) 122/96 (BP Location: Right Arm)   Pulse (!) 110   Temp 98 F (36.7 C)   Resp 18   SpO2 100%  Physical Exam Vitals and nursing note reviewed.  Constitutional:      Appearance: She is well-developed. She is obese.  HENT:     Head:  Normocephalic and atraumatic.  Cardiovascular:     Rate and Rhythm: Regular rhythm. Tachycardia present.     Heart sounds: Normal heart sounds.  Pulmonary:     Effort: Pulmonary effort is normal. Tachypnea present.     Breath sounds: Decreased breath sounds and wheezing present.  Abdominal:     Palpations: Abdomen is soft.     Tenderness: There is no abdominal tenderness.  Musculoskeletal:     Right lower leg: Edema present.     Left lower leg: Edema present.  Skin:    General: Skin is warm and dry.  Neurological:     Mental Status: She is alert.     ED Results / Procedures / Treatments   Labs (all labs ordered are listed, but only abnormal results are displayed) Labs Reviewed  COMPREHENSIVE METABOLIC PANEL - Abnormal; Notable for the following components:      Result Value   Glucose, Bld 141 (*)    Calcium 8.5 (*)    Albumin 3.3 (*)    All other components within normal limits  CBC WITH DIFFERENTIAL/PLATELET - Abnormal; Notable for the following components:   WBC 15.1 (*)    RBC 5.27 (*)    Hemoglobin 15.8 (*)    HCT 50.8 (*)    Neutro Abs 9.4 (*)    Lymphs Abs 4.2 (*)    Monocytes Absolute 1.2 (*)    nRBC 1 (*)    All other components within normal limits  RESP PANEL BY RT-PCR (FLU A&B, COVID) ARPGX2  BRAIN NATRIURETIC PEPTIDE  I-STAT BETA HCG BLOOD, ED ([facility], [facility], [facility] ONLY)  TROPONIN I (HIGH SENSITIVITY)  TROPONIN I (HIGH SENSITIVITY)    EKG EKG Interpretation  Date/Time:  Sunday August 15 2022 09:44:43 EST Ventricular Rate:  117 PR Interval:  142 QRS Duration: 98 QT Interval:  337 QTC Calculation: 471 R Axis:   49 Text Interpretation: Sinus tachycardia Right atrial enlargement nonspecific ST changes Confirmed by Sherwood Gambler [phone]) on 08/15/2022 10:05:29 AM  Radiology DG Chest Portable 1 View  Result Date: 08/15/2022 CLINICAL DATA:  Respiratory distress EXAM: PORTABLE CHEST 1 VIEW COMPARISON:  Chest radiograph dated 06/26/2022 FINDINGS: Normal lung  volumes. No focal consolidations. No pleural effusion or pneumothorax. The heart size and mediastinal contours are within normal limits. The visualized skeletal structures are unremarkable. IMPRESSION: No active disease. Electronically Signed   By: Darrin Nipper M.D.   On: 08/15/2022 10:28    Procedures .Critical Care  Performed by: Sherwood Gambler, MD Authorized by: Sherwood Gambler, MD   Critical care provider statement:    Critical care time (minutes):  35   Critical care time was exclusive of:  Separately billable procedures and treating other patients   Critical care was necessary to treat or prevent imminent or life-threatening deterioration of the following conditions:  Respiratory failure   Critical care was time spent personally by me on the following  activities:  Development of treatment plan with patient or surrogate, discussions with consultants, evaluation of patient's response to treatment, examination of patient, ordering and review of laboratory studies, ordering and review of radiographic studies, ordering and performing treatments and interventions, pulse oximetry, re-evaluation of patient's condition and review of old charts     Medications Ordered in ED Medications  albuterol (PROVENTIL,VENTOLIN) solution continuous neb (0 mg/hr Nebulization Hold 08/15/22 0958)  arformoterol (BROVANA) nebulizer solution 15 mcg (15 mcg Nebulization Given 08/15/22 1241)  budesonide (PULMICORT) nebulizer solution 0.5 mg (0.5 mg Nebulization Given 08/15/22 1241)  enoxaparin (LOVENOX) injection 70 mg (has no administration in time range)  sodium chloride flush (NS) 0.9 % injection 3 mL (3 mLs Intravenous Given 08/15/22 1241)  acetaminophen (TYLENOL) tablet 650 mg (has no administration in time range)    Or  acetaminophen (TYLENOL) suppository 650 mg (has no administration in time range)  ondansetron (ZOFRAN) tablet 4 mg (has no administration in time range)    Or  ondansetron (ZOFRAN) injection 4  mg (has no administration in time range)  albuterol (PROVENTIL) (2.5 MG/3ML) 0.083% nebulizer solution 2.5 mg (has no administration in time range)  ipratropium-albuterol (DUONEB) 0.5-2.5 (3) MG/3ML nebulizer solution 3 mL (3 mLs Nebulization Given 08/15/22 1241)  guaiFENesin (MUCINEX) 12 hr tablet 600 mg (600 mg Oral Given 08/15/22 1241)  doxycycline (VIBRA-TABS) tablet 100 mg (100 mg Oral Given 08/15/22 1241)  predniSONE (DELTASONE) tablet 40 mg (has no administration in time range)  bisoprolol (ZEBETA) tablet 2.5 mg (has no administration in time range)  furosemide (LASIX) tablet 20 mg (has no administration in time range)  sacubitril-valsartan (ENTRESTO) 24-26 mg per tablet (has no administration in time range)  spironolactone (ALDACTONE) tablet 12.5 mg (has no administration in time range)  buPROPion (WELLBUTRIN XL) 24 hr tablet 150 mg (has no administration in time range)  empagliflozin (JARDIANCE) tablet 10 mg (has no administration in time range)  loratadine (CLARITIN) tablet 10 mg (has no administration in time range)  montelukast (SINGULAIR) tablet 10 mg (has no administration in time range)  ipratropium (ATROVENT) nebulizer solution 0.5 mg (0.5 mg Nebulization Given 08/15/22 0958)  albuterol (PROVENTIL) (2.5 MG/3ML) 0.083% nebulizer solution (10 mg  Given 08/15/22 5102)    ED Course/ Medical Decision Making/ A&P                           Medical Decision Making Amount and/or Complexity of Data Reviewed External Data Reviewed: notes. Labs: ordered.    Details: Leukocytosis is probably reactive.  Troponin normal.  BNP normal.  COVID/flu negative. Radiology: ordered and independent interpretation performed.    Details: CHF/pneumonia ECG/medicine tests: ordered and independent interpretation performed.    Details: Tachycardia without ischemia.  Risk Prescription drug management. Decision regarding hospitalization.   Overall I think patient's respiratory distress is related  to acute asthma exacerbation.  She was placed on BiPAP and then given an hour-long albuterol treatment.  She is feeling a lot better.  She was able to be taken off BiPAP.  Given how ill she was on appearance on arrival, I think is reasonable to admit for observation.  Otherwise, she was given steroids and magnesium by EMS.  I do not think epinephrine is necessary.  I doubt CHF or bacterial illness such as pneumonia.  Will admit to the hospitalist service and I have consulted Dr. Tamala Julian for admission.        Final Clinical Impression(s) / ED Diagnoses Final diagnoses:  Severe persistent asthma with exacerbation    Rx / DC Orders ED Discharge Orders     None         Sherwood Gambler, MD 08/15/22 1500

## 2022-08-15 NOTE — ED Notes (Signed)
Ambulates to the restroom without difficulty and provided a snack upon return to room

## 2022-08-15 NOTE — ED Triage Notes (Addendum)
Patient arrived by EMS from home with increasing SOB since last night. Patient has hx of CHF and has been taking her heart meds as prescribed. Has noted more fluid and weight gain x 2 days. Patient took 2 of her duonebs prior to EMS arrival with no relief. EMS administered the following: Duoneb x 2 Solumedrol 125 Mag 2grams  NTG x 1.  Patient denies cough, no chest pain, no fever. Patient alert and oriented smoker

## 2022-08-16 ENCOUNTER — Encounter: Payer: Self-pay | Admitting: *Deleted

## 2022-08-16 DIAGNOSIS — D72829 Elevated white blood cell count, unspecified: Secondary | ICD-10-CM | POA: Diagnosis not present

## 2022-08-16 DIAGNOSIS — J441 Chronic obstructive pulmonary disease with (acute) exacerbation: Secondary | ICD-10-CM | POA: Diagnosis not present

## 2022-08-16 DIAGNOSIS — I5042 Chronic combined systolic (congestive) and diastolic (congestive) heart failure: Secondary | ICD-10-CM | POA: Diagnosis not present

## 2022-08-16 DIAGNOSIS — J9601 Acute respiratory failure with hypoxia: Secondary | ICD-10-CM | POA: Diagnosis not present

## 2022-08-16 DIAGNOSIS — F17210 Nicotine dependence, cigarettes, uncomplicated: Secondary | ICD-10-CM | POA: Diagnosis not present

## 2022-08-16 DIAGNOSIS — G4733 Obstructive sleep apnea (adult) (pediatric): Secondary | ICD-10-CM | POA: Diagnosis not present

## 2022-08-16 DIAGNOSIS — I110 Hypertensive heart disease with heart failure: Secondary | ICD-10-CM | POA: Diagnosis not present

## 2022-08-16 DIAGNOSIS — D751 Secondary polycythemia: Secondary | ICD-10-CM | POA: Diagnosis not present

## 2022-08-16 DIAGNOSIS — J4551 Severe persistent asthma with (acute) exacerbation: Secondary | ICD-10-CM | POA: Diagnosis not present

## 2022-08-16 DIAGNOSIS — Z7984 Long term (current) use of oral hypoglycemic drugs: Secondary | ICD-10-CM | POA: Diagnosis not present

## 2022-08-16 DIAGNOSIS — Z79899 Other long term (current) drug therapy: Secondary | ICD-10-CM | POA: Diagnosis not present

## 2022-08-16 DIAGNOSIS — R0603 Acute respiratory distress: Secondary | ICD-10-CM | POA: Diagnosis not present

## 2022-08-16 DIAGNOSIS — Z1152 Encounter for screening for COVID-19: Secondary | ICD-10-CM | POA: Diagnosis not present

## 2022-08-16 LAB — CBC
HCT: 47.4 % — ABNORMAL HIGH (ref 36.0–46.0)
Hemoglobin: 14.7 g/dL (ref 12.0–15.0)
MCH: 29.8 pg (ref 26.0–34.0)
MCHC: 31 g/dL (ref 30.0–36.0)
MCV: 96.1 fL (ref 80.0–100.0)
Platelets: 198 10*3/uL (ref 150–400)
RBC: 4.93 MIL/uL (ref 3.87–5.11)
RDW: 13.6 % (ref 11.5–15.5)
WBC: 11.1 10*3/uL — ABNORMAL HIGH (ref 4.0–10.5)
nRBC: 0 % (ref 0.0–0.2)

## 2022-08-16 LAB — BASIC METABOLIC PANEL
Anion gap: 13 (ref 5–15)
BUN: 20 mg/dL (ref 6–20)
CO2: 22 mmol/L (ref 22–32)
Calcium: 8.8 mg/dL — ABNORMAL LOW (ref 8.9–10.3)
Chloride: 104 mmol/L (ref 98–111)
Creatinine, Ser: 0.98 mg/dL (ref 0.44–1.00)
GFR, Estimated: >60 mL/min (ref >60)
Glucose, Bld: 120 mg/dL — ABNORMAL HIGH (ref 70–99)
Potassium: 4.2 mmol/L (ref 3.5–5.1)
Sodium: 139 mmol/L (ref 135–145)

## 2022-08-16 MED ORDER — NICOTINE 14 MG/24HR TD PT24: 14.0000 mg | MEDICATED_PATCH | TRANSDERMAL | 0 refills | Status: DC

## 2022-08-16 MED ORDER — DOXYCYCLINE HYCLATE 100 MG PO TABS: 100.0000 mg | ORAL_TABLET | Freq: Two times a day (BID) | ORAL | 0 refills | Status: AC

## 2022-08-16 MED ORDER — PREDNISONE 20 MG PO TABS: 40.0000 mg | ORAL_TABLET | Freq: Every day | ORAL | 0 refills | Status: AC

## 2022-08-16 MED ORDER — GUAIFENESIN ER 600 MG PO TB12: 600.0000 mg | ORAL_TABLET | Freq: Two times a day (BID) | ORAL | 0 refills | Status: AC

## 2022-08-16 NOTE — Discharge Summary (Signed)
Physician Discharge Summary  April Ellis UYQ:[id number] DOB: 09/09/1976 DOA: 08/15/2022  PCP: [name], FNP  Admit date: 08/15/2022 Discharge date: 08/16/2022  Admitted From: Home  Discharge disposition: Home  Recommendations for Outpatient Follow-Up:   Follow up with your primary care provider in one week.  Check CBC, BMP, magnesium in the next visit Follow-up with your cardiologist as scheduled by the clinic.  Discharge Diagnosis:   Active Problems:   Asthma-COPD overlap syndrome   Acute respiratory failure with hypoxia (HCC)   Asthma with COPD with exacerbation (HCC)   Leukocytosis   Chronic combined systolic and diastolic CHF (congestive heart failure) (HCC)   Polycythemia   Essential hypertension   Depression   Tobacco abuse   Obstructive sleep apnea   Nocturnal hypoxemia   Discharge Condition: Improved.  Diet recommendation: Low sodium, heart healthy.   Wound care: None.  Code status: Full.   History of Present Illness:   April Ellis is a 46 y.o. female with medical history significant for systolic congestive heart failure, asthma -COPD overlap syndrome not on oxygen, history of respiratory arrest, iron deficiency anemia, depression, morbid obesity presented to hospital with shortness of breath for 2 to 3 days with wheezing and intermittent cough without any weight gain.  She does smoke 1 to 4 cigarettes a day.  She was last hospitalized secondary to hypoxia from  asthma-COPD overlap syndrome with new onset CHF in September of this year.  Patient reported  compliance with medication including diuretics.  In the ED, patient was tachypneic tachycardic was initially put on BiPAP due to increased work of breathing.  Subsequently, patient was was weaned to 2 L of nasal cannula.  Initial WBC was 15.1 BNP 53.  Chest x-ray showed no acute abnormality.   Influenza and COVID-19 screening were negative.  Patient was then admitted to the hospital for further  evaluation and treatment.  Hospital Course:   Following conditions were addressed during hospitalization as listed below,   Acute respiratory failure with hypoxia secondary to asthma with COPD with exacerbation asthma- COPD overlap syndrome  Patient was initially put on BiPAP due to increased work of breathing which was weaned to 2 L of nasal cannula and now on room air.  Patient has been followed by pulmonary as outpatient.  Continue DuoNebs, empiric doxycycline, prednisone on discharge.  Patient was advised against smoking and will be given nicotine patch as well.  Leukocytosis Likely secondary to steroids prior to coming to the hospital.     Combined systolic and diastolic CHF Euvolemic at this time.  BNP 53.  Last echocardiogram noted EF to be 35% with grade 2 diastolic dysfunction with RV contraction normal.  Continue beta-blockers, diuretics.   Polycythemia History of COPD OSA OSA and/or tobacco use.  Hemoglobin today at 14.7.   Essential hypertension On bisoprolol, spironolactone and Entresto.  We will continue on discharge.   Depression Continue Wellbutrin   Tobacco abuse Counseled.   Obstructive sleep apnea nocturnal hypoxemia Recommend CPAP on discharge.   Morbid obesity  Previous BMI more than 60.  Would benefit from weight loss as outpatient.  Disposition.  At this time, patient is stable for disposition home with outpatient PCP pulmonary and cardiology follow-up.  Medical Consultants:   None.  Procedures:    BiPAP placement Subjective:   Today, patient was seen and examined at bedside.  Feels better.  Has less wheezing cough or shortness of breath.  On room air.  Able to ambulate without any  issues.  Wishes to go home.  Spoke about smoking.  Discharge Exam:   Vitals:   08/16/22 0500 08/16/22 0600  BP: 103/61 (!) 101/52  Pulse: 80 71  Resp: 15 14  Temp:    SpO2: 97% 95%   Vitals:   08/16/22 0300 08/16/22 0440 08/16/22 0500 08/16/22 0600  BP:  102/61 122/66 103/61 (!) 101/52  Pulse: 73 82 80 71  Resp: '15 13 15 14  '$ Temp:  98.3 F (36.8 C)    TempSrc:  Oral    SpO2: 97% 93% 97% 95%    General: Alert awake, not in obvious distress, morbidly obese HENT: pupils equally reacting to light,  No scleral pallor or icterus noted. Oral mucosa is moist.  Chest:   Diminished breath sounds bilaterally. No crackles or wheezes.  CVS: S1 &S2 heard. No murmur.  Regular rate and rhythm. Abdomen: Soft, nontender, nondistended.  Bowel sounds are heard.   Extremities: No cyanosis, clubbing with trace edema.  Peripheral pulses are palpable. Psych: Alert, awake and oriented, normal mood CNS:  No cranial nerve deficits.  Power equal in all extremities.   Skin: Warm and dry.  No rashes noted.  The results of significant diagnostics from this hospitalization (including imaging, microbiology, ancillary and laboratory) are listed below for reference.     Diagnostic Studies:   DG Chest Portable 1 View  Result Date: 08/15/2022 CLINICAL DATA:  Respiratory distress EXAM: PORTABLE CHEST 1 VIEW COMPARISON:  Chest radiograph dated 06/26/2022 FINDINGS: Normal lung volumes. No focal consolidations. No pleural effusion or pneumothorax. The heart size and mediastinal contours are within normal limits. The visualized skeletal structures are unremarkable. IMPRESSION: No active disease. Electronically Signed   By: Darrin Nipper M.D.   On: 08/15/2022 10:28     Labs:   Basic Metabolic Panel: Recent Labs  Lab 08/15/22 0944 08/16/22 0452  NA 138 139  K 5.1 4.2  CL 104 104  CO2 26 22  GLUCOSE 141* 120*  BUN 13 20  CREATININE 0.99 0.98  CALCIUM 8.5* 8.8*   GFR Estimated Creatinine Clearance: 97.4 mL/min (by C-G formula based on SCr of 0.98 mg/dL). Liver Function Tests: Recent Labs  Lab 08/15/22 0944  AST 19  ALT 13  ALKPHOS 94  BILITOT 0.9  PROT 6.8  ALBUMIN 3.3*   No results for input(s): "LIPASE", "AMYLASE" in the last 168 hours. No results for  input(s): "AMMONIA" in the last 168 hours. Coagulation profile No results for input(s): "INR", "PROTIME" in the last 168 hours.  CBC: Recent Labs  Lab 08/15/22 0944 08/16/22 0123  WBC 15.1* 11.1*  NEUTROABS 9.4*  --   HGB 15.8* 14.7  HCT 50.8* 47.4*  MCV 96.4 96.1  PLT 260 198   Cardiac Enzymes: No results for input(s): "CKTOTAL", "CKMB", "CKMBINDEX", "TROPONINI" in the last 168 hours. BNP: Invalid input(s): "POCBNP" CBG: No results for input(s): "GLUCAP" in the last 168 hours. D-Dimer No results for input(s): "DDIMER" in the last 72 hours. Hgb A1c No results for input(s): "HGBA1C" in the last 72 hours. Lipid Profile No results for input(s): "CHOL", "HDL", "LDLCALC", "TRIG", "CHOLHDL", "LDLDIRECT" in the last 72 hours. Thyroid function studies No results for input(s): "TSH", "T4TOTAL", "T3FREE", "THYROIDAB" in the last 72 hours.  Invalid input(s): "FREET3" Anemia work up No results for input(s): "VITAMINB12", "FOLATE", "FERRITIN", "TIBC", "IRON", "RETICCTPCT" in the last 72 hours. Microbiology Recent Results (from the past 240 hour(s))  Resp Panel by RT-PCR (Flu A&B, Covid) Anterior Nasal Swab  Status: None   Collection Time: 08/15/22  9:44 AM   Specimen: Anterior Nasal Swab  Result Value Ref Range Status   SARS Coronavirus 2 by RT PCR NEGATIVE NEGATIVE Final    Comment: (NOTE) SARS-CoV-2 target nucleic acids are NOT DETECTED.  The SARS-CoV-2 RNA is generally detectable in upper respiratory specimens during the acute phase of infection. The lowest concentration of SARS-CoV-2 viral copies this assay can detect is 138 copies/mL. A negative result does not preclude SARS-Cov-2 infection and should not be used as the sole basis for treatment or other patient management decisions. A negative result may occur with  improper specimen collection/handling, submission of specimen other than nasopharyngeal swab, presence of viral mutation(s) within the areas targeted by  this assay, and inadequate number of viral copies(<138 copies/mL). A negative result must be combined with clinical observations, patient history, and epidemiological information. The expected result is Negative.  Fact Sheet for Patients:  EntrepreneurPulse.com.au  Fact Sheet for Healthcare Providers:  IncredibleEmployment.be  This test is no t yet approved or cleared by the [place] FDA and  has been authorized for detection and/or diagnosis of SARS-CoV-2 by FDA under an Emergency Use Authorization (EUA). This EUA will remain  in effect (meaning this test can be used) for the duration of the COVID-19 declaration under Section 564(b)(1) of the Act, 21 U.S.C.section 360bbb-3(b)(1), unless the authorization is terminated  or revoked sooner.       Influenza A by PCR NEGATIVE NEGATIVE Final   Influenza B by PCR NEGATIVE NEGATIVE Final    Comment: (NOTE) The Xpert Xpress SARS-CoV-2/FLU/RSV plus assay is intended as an aid in the diagnosis of influenza from Nasopharyngeal swab specimens and should not be used as a sole basis for treatment. Nasal washings and aspirates are unacceptable for Xpert Xpress SARS-CoV-2/FLU/RSV testing.  Fact Sheet for Patients: EntrepreneurPulse.com.au  Fact Sheet for Healthcare Providers: IncredibleEmployment.be  This test is not yet approved or cleared by the [place] FDA and has been authorized for detection and/or diagnosis of SARS-CoV-2 by FDA under an Emergency Use Authorization (EUA). This EUA will remain in effect (meaning this test can be used) for the duration of the COVID-19 declaration under Section 564(b)(1) of the Act, 21 U.S.C. section 360bbb-3(b)(1), unless the authorization is terminated or revoked.  Performed at [facility], [place] [address] [place], [place] [address]      Discharge Instructions:   Discharge Instructions     Diet - low  sodium heart healthy   Complete by: As directed    Discharge instructions   Complete by: As directed    Follow-up with your primary care physician in 1 week.  Do not smoke.  Use nicotine patch as prescribed.  Seek medical attention for worsening symptoms.  Use inhalers and medications as prescribed.   Increase activity slowly   Complete by: As directed       Allergies as of 08/16/2022       Reactions   Penicillins Other (See Comments)   Childhood Allergy Did it involve swelling of the face/tongue/throat, SOB, or low BP? UNK Did it involve sudden or severe rash/hives, skin peeling, or any reaction on the inside of your mouth or nose? UNK Did you need to seek medical attention at a hospital or doctor's office? UNK When did it last happen?      more than 10 years If all above answers are "NO", may proceed with cephalosporin use.   P[place] Other (See Comments)   Tested  allergic to this        Medication List     TAKE these medications    albuterol 108 (90 Base) MCG/ACT inhaler Commonly known as: VENTOLIN HFA Inhale 2 puffs into the lungs every 4 (four) hours as needed for wheezing or shortness of breath. What changed: when to take this   bisoprolol 5 MG tablet Commonly known as: ZEBETA Take 0.5 tablets (2.5 mg total) by mouth daily.   budesonide-formoterol 160-4.5 MCG/ACT inhaler Commonly known as: SYMBICORT Take 2 puffs twice daily What changed:  how much to take how to take this when to take this additional instructions   buPROPion 150 MG 24 hr tablet Commonly known as: WELLBUTRIN XL TAKE 1 TABLET BY MOUTH EVERY DAY IN THE MORNING What changed:  how much to take how to take this when to take this additional instructions   cetirizine 10 MG tablet Commonly known as: ZYRTEC Take 10 mg by mouth daily as needed for allergies or rhinitis.   doxycycline 100 MG tablet Commonly known as: VIBRA-TABS Take 1 tablet (100 mg total) by mouth every 12 (twelve) hours  for 3 days.   empagliflozin 10 MG Tabs tablet Commonly known as: JARDIANCE Take 1 tablet (10 mg total) by mouth daily.   EPINEPHrine 0.3 mg/0.3 mL Soaj injection Commonly known as: EPI-PEN Inject 0.3 mg into the muscle as needed for anaphylaxis.   furosemide 20 MG tablet Commonly known as: Lasix Take 1 tablet (20 mg total) by mouth daily.   guaiFENesin 600 MG 12 hr tablet Commonly known as: MUCINEX Take 1 tablet (600 mg total) by mouth 2 (two) times daily for 5 days.   ipratropium-albuterol 0.5-2.5 (3) MG/3ML Soln Commonly known as: DUONEB INHALE 1 VIAL BY MOUTH VIA NEBULIZER EVERY 6 HOURS AS NEEDED What changed:  how much to take how to take this when to take this reasons to take this additional instructions   montelukast 10 MG tablet Commonly known as: SINGULAIR Take 1 tablet (10 mg total) by mouth at bedtime. TAKE 1 TABLET BY MOUTH EVERYDAY AT BEDTIME Strength: 10 mg   Nicorette 4 MG lozenge Generic drug: nicotine polacrilex Take 4 mg by mouth as needed for smoking cessation.   nicotine 14 mg/24hr patch Commonly known as: NICODERM CQ - dosed in mg/24 hours Place 1 patch (14 mg total) onto the skin daily.   ondansetron 4 MG tablet Commonly known as: ZOFRAN Take 1 tablet (4 mg total) by mouth every 6 (six) hours.   predniSONE 20 MG tablet Commonly known as: DELTASONE Take 2 tablets (40 mg total) by mouth daily with breakfast for 3 days.   sacubitril-valsartan 24-26 MG Commonly known as: ENTRESTO Take 1 tablet by mouth 2 (two) times daily.   spironolactone 25 MG tablet Commonly known as: ALDACTONE Take 0.5 tablets (12.5 mg total) by mouth daily.        Follow-up Information     [name], FNP Follow up in 1 week(s).   Specialty: General Practice Contact information: [address] [place] [address] [phone]                  Time coordinating discharge: 39 minutes  Signed:  Shaniqua Guillot  [facility] 08/16/2022, 9:37 AM

## 2022-08-16 NOTE — Progress Notes (Signed)
Fuller Mandril, RN, BSN, [place] [phone] Pt qualifies for DME [facility]) CPAP machine and supplies.  DME  ordered through [facility].  Melene Muller of [employer] notified to deliver DME to pt at home.

## 2022-08-17 ENCOUNTER — Telehealth: Payer: Self-pay

## 2022-08-18 ENCOUNTER — Telehealth: Payer: Self-pay

## 2022-08-18 NOTE — Telephone Encounter (Signed)
Transition Care Management Follow-up Telephone Call Date of discharge and from where: 08/16/2022 [facility]  How have you been since you were released from the hospital? Pt states she feels fine. She adds just started a new position, she does not have any open availability being she has no time to use to take off. She cannot come in until her actual sch appointment. Provider notified.  Any questions or concerns? No  Items Reviewed: Did the pt receive and understand the discharge instructions provided? Yes  Medications obtained and verified? Yes  Other? Yes  Any new allergies since your discharge? No  Dietary orders reviewed? Yes Do you have support at home? Yes   Home Care and Equipment/Supplies: Were home health services ordered? no If so, what is the name of the agency? N/a  Has the agency set up a time to come to the patient's home? no Were any new equipment or medical supplies ordered?  No What is the name of the medical supply agency? N/a Were you able to get the supplies/equipment? no Do you have any questions related to the use of the equipment or supplies? No  Functional Questionnaire: (I = Independent and D = Dependent) ADLs: i  Bathing/Dressing- i  Meal Prep- i  Eating- i  Maintaining continence- i  Transferring/Ambulation- i  Managing Meds- i  Follow up appointments reviewed:  PCP Hospital f/u appt confirmed? No  Scheduled to see janece moore on n/a @ n/a. [facility] f/u appt confirmed? No  Scheduled to see n/a on n/a @ n/a. Are transportation arrangements needed? No  If their condition worsens, is the pt aware to call PCP or go to the Emergency Dept.? Yes Was the patient provided with contact information for the PCP's office or ED? Yes Was to pt encouraged to call back with questions or concerns? Yes

## 2022-08-23 ENCOUNTER — Telehealth: Payer: Self-pay | Admitting: *Deleted

## 2022-08-23 DIAGNOSIS — G4733 Obstructive sleep apnea (adult) (pediatric): Secondary | ICD-10-CM

## 2022-08-23 DIAGNOSIS — G4734 Idiopathic sleep related nonobstructive alveolar hypoventilation: Secondary | ICD-10-CM

## 2022-08-23 DIAGNOSIS — R400 Somnolence: Secondary | ICD-10-CM

## 2022-08-23 NOTE — Telephone Encounter (Signed)
The patient has been notified of the result and verbalized understanding.  All questions (if any) were answered. Marolyn Hammock, CMA 08/23/2022 3:72 PM    Will precert titration

## 2022-08-23 NOTE — Telephone Encounter (Signed)
-----   Message from Lauralee Evener, [place] sent at 07/19/2022  8:33 AM EDT -----  ----- Message ----- From: Sueanne Margarita, MD Sent: 07/18/2022   7:24 PM EDT To: Cv Div Sleep Studies  Please let patient know that they have sleep apnea.  Recommend therapeutic CPAP titration for treatment of patient's sleep disordered breathing.  If unable to perform an in lab titration then initiate ResMed auto CPAP from 4 to 15cm H2O with heated humidity and mask of choice and overnight pulse ox on CPAP.

## 2022-09-03 ENCOUNTER — Encounter: Payer: BC Managed Care – PPO | Admitting: Dietician

## 2022-09-07 ENCOUNTER — Encounter: Payer: BC Managed Care – PPO | Admitting: Nurse Practitioner

## 2022-09-09 ENCOUNTER — Other Ambulatory Visit (HOSPITAL_COMMUNITY): Payer: BC Managed Care – PPO

## 2022-09-10 ENCOUNTER — Encounter: Payer: BC Managed Care – PPO | Admitting: Dietician

## 2022-09-14 ENCOUNTER — Other Ambulatory Visit: Payer: Self-pay | Admitting: Gastroenterology

## 2022-09-15 NOTE — Progress Notes (Signed)
Pt referred to PREP but only attended 4 sessions due to health issues. Pt communicated she needed to withdrawal until her health improves.

## 2022-09-23 NOTE — Addendum Note (Signed)
Addended by: Freada Bergeron on: 09/23/2022 04:56 PM   Modules accepted: Orders

## 2022-09-23 NOTE — Telephone Encounter (Addendum)
Prior Authorization for CPAP TITRATION sent to [facility] via Phone. Reference # DENIED DOES NOT MEET CRITERIA   If unable to perform an in lab titration then initiate ResMed auto CPAP from 4 to 15cm H2O with heated humidity and mask of choice and overnight pulse ox on CPAP.

## 2022-10-05 NOTE — Telephone Encounter (Signed)
Chmg-error.  

## 2022-10-06 DIAGNOSIS — G4733 Obstructive sleep apnea (adult) (pediatric): Secondary | ICD-10-CM | POA: Diagnosis not present

## 2022-10-07 ENCOUNTER — Ambulatory Visit: Payer: BC Managed Care – PPO | Admitting: Dietician

## 2022-10-09 DIAGNOSIS — I10 Essential (primary) hypertension: Secondary | ICD-10-CM | POA: Diagnosis not present

## 2022-10-09 DIAGNOSIS — R0902 Hypoxemia: Secondary | ICD-10-CM | POA: Diagnosis not present

## 2022-10-09 DIAGNOSIS — I959 Hypotension, unspecified: Secondary | ICD-10-CM | POA: Diagnosis not present

## 2022-10-09 DIAGNOSIS — R062 Wheezing: Secondary | ICD-10-CM | POA: Diagnosis not present

## 2022-10-13 ENCOUNTER — Other Ambulatory Visit: Payer: Self-pay | Admitting: Nurse Practitioner

## 2022-10-13 ENCOUNTER — Encounter (HOSPITAL_COMMUNITY): Payer: BC Managed Care – PPO | Admitting: Cardiology

## 2022-10-13 DIAGNOSIS — F3289 Other specified depressive episodes: Secondary | ICD-10-CM

## 2022-10-15 ENCOUNTER — Encounter (HOSPITAL_COMMUNITY): Payer: BC Managed Care – PPO | Admitting: Cardiology

## 2022-10-19 DIAGNOSIS — R000 Tachycardia, unspecified: Secondary | ICD-10-CM | POA: Diagnosis not present

## 2022-10-19 DIAGNOSIS — R0902 Hypoxemia: Secondary | ICD-10-CM | POA: Diagnosis not present

## 2022-10-19 DIAGNOSIS — R062 Wheezing: Secondary | ICD-10-CM | POA: Diagnosis not present

## 2022-10-19 DIAGNOSIS — I10 Essential (primary) hypertension: Secondary | ICD-10-CM | POA: Diagnosis not present

## 2022-10-25 ENCOUNTER — Encounter (HOSPITAL_COMMUNITY): Payer: Self-pay | Admitting: Cardiology

## 2022-10-25 ENCOUNTER — Ambulatory Visit (HOSPITAL_COMMUNITY)
Admission: RE | Admit: 2022-10-25 | Discharge: 2022-10-25 | Disposition: A | Discharge status: Home / Self Care | Payer: BC Managed Care – PPO | Source: Ambulatory Visit | Attending: Cardiology | Admitting: Cardiology

## 2022-10-25 VITALS — BP 90/50 | HR 92 | Wt 325.8 lb

## 2022-10-25 DIAGNOSIS — G4733 Obstructive sleep apnea (adult) (pediatric): Secondary | ICD-10-CM | POA: Insufficient documentation

## 2022-10-25 DIAGNOSIS — Z79899 Other long term (current) drug therapy: Secondary | ICD-10-CM | POA: Insufficient documentation

## 2022-10-25 DIAGNOSIS — J4489 Other specified chronic obstructive pulmonary disease: Secondary | ICD-10-CM | POA: Diagnosis not present

## 2022-10-25 DIAGNOSIS — I5022 Chronic systolic (congestive) heart failure: Secondary | ICD-10-CM

## 2022-10-25 DIAGNOSIS — E669 Obesity, unspecified: Secondary | ICD-10-CM | POA: Insufficient documentation

## 2022-10-25 DIAGNOSIS — R0602 Shortness of breath: Secondary | ICD-10-CM | POA: Insufficient documentation

## 2022-10-25 DIAGNOSIS — Z6841 Body Mass Index (BMI) 40.0 and over, adult: Secondary | ICD-10-CM | POA: Insufficient documentation

## 2022-10-25 DIAGNOSIS — F17210 Nicotine dependence, cigarettes, uncomplicated: Secondary | ICD-10-CM | POA: Insufficient documentation

## 2022-10-25 DIAGNOSIS — Z7984 Long term (current) use of oral hypoglycemic drugs: Secondary | ICD-10-CM | POA: Insufficient documentation

## 2022-10-25 DIAGNOSIS — I5042 Chronic combined systolic (congestive) and diastolic (congestive) heart failure: Secondary | ICD-10-CM | POA: Diagnosis not present

## 2022-10-25 DIAGNOSIS — I428 Other cardiomyopathies: Secondary | ICD-10-CM | POA: Insufficient documentation

## 2022-10-25 DIAGNOSIS — R0600 Dyspnea, unspecified: Secondary | ICD-10-CM

## 2022-10-25 LAB — BASIC METABOLIC PANEL
Anion gap: 10 (ref 5–15)
BUN: 11 mg/dL (ref 6–20)
CO2: 28 mmol/L (ref 22–32)
Calcium: 8.8 mg/dL — ABNORMAL LOW (ref 8.9–10.3)
Chloride: 99 mmol/L (ref 98–111)
Creatinine, Ser: 0.89 mg/dL (ref 0.44–1.00)
GFR, Estimated: >60 mL/min (ref >60)
Glucose, Bld: 86 mg/dL (ref 70–99)
Potassium: 3.8 mmol/L (ref 3.5–5.1)
Sodium: 137 mmol/L (ref 135–145)

## 2022-10-25 LAB — BRAIN NATRIURETIC PEPTIDE: B Natriuretic Peptide: 14.2 pg/mL (ref 0.0–100.0)

## 2022-10-25 NOTE — Patient Instructions (Signed)
INCREASE Lasix to '40mg'$  daily for 2 days, then go back to regular dose.  Labs done today, your results will be available in MyChart, we will contact you for abnormal readings.  You have been referred to the healthy weight management clinic. You will be called to have your appointment arranged.  Your physician recommends that you schedule a follow-up appointment in: 6 months (July 2024)  ** please call the office in May to arrange your follow up appointment **  If you have any questions or concerns before your next appointment please send [place] a message through [place] or call our office at [phone].    TO LEAVE A MESSAGE FOR THE NURSE SELECT OPTION 2, PLEASE LEAVE A MESSAGE INCLUDING: YOUR NAME DATE OF BIRTH CALL BACK NUMBER REASON FOR CALL**this is important as we prioritize the call backs  YOU WILL RECEIVE A CALL BACK THE SAME DAY AS LONG AS YOU CALL BEFORE 4:00 PM  At the [facility], you and your health needs are our priority. As part of our continuing mission to provide you with exceptional heart care, we have created designated Provider Care Teams. These Care Teams include your primary Cardiologist (physician) and Advanced Practice Providers (APPs- Physician Assistants and Nurse Practitioners) who all work together to provide you with the care you need, when you need it.   You may see any of the following providers on your designated Care Team at your next follow up: Dr Glori Bickers Dr Loralie Champagne Dr. Roxana Hires, NP Lyda Jester, [place] [facility] [place], [place] Forestine Na, NP Audry Riles, PharmD   Please be sure to bring in all your medications bottles to every appointment.    Thank you for choosing [facility]

## 2022-10-25 NOTE — Progress Notes (Signed)
ADVANCED HEART FAILURE CLINIC NOTE  Referring Physician: [name], FNP  Primary Care: [name], FNP Primary Cardiologist:  HPI: April Ellis is a 47 y.o. female with asthma, hx of tobacco use, OSA not on CPAP and recently diagnosed systolic heart failure presenting today to establish care after being seen in [facility] clinic. April Ellis was admitted in September 2023 to [facility] for significant respiratory distress; at that time respiratory panel was positive for rhinovirus. TTE w/ LVEF of 35%. She was diuresed, started on GDMT and discharged. Since that time she has followed up at [facility] clinic where she was started on Jardiance.   Interval hx:  - Since our last follow up, April Ellis reports that she has had some episodes of shortness of breath. These episodes can occur spontaneously; she believes these are secondary to allergies. Along with these episodes of shortness of breath she has "itching" on her face, throat and inside her mouth.  These episodes occur 3-4x weekly and improve with benadryl. No orthopnea, PND. Several days ago she became SOB, called EMS and had rapid resolution of her symptoms with IV steroids + nebulizer treatment. Unfortunately she continues to smoke cigarettes.  Activity level/exercise tolerance:  III; limited by weight, COPD.  Orthopnea:  Sleeps on 2 pillows Paroxysmal noctural dyspnea:  No Chest pain/pressure:  No Orthostatic lightheadedness:  No Palpitations:  No Lower extremity edema:  Yes 2+ pitting edema in B/L extremities.  Presyncope/syncope:  no Cough:  minimal, improving.   Past Medical History:  Diagnosis Date   Anemia    Asthma    Bronchitis    COPD (chronic obstructive pulmonary disease) ([place])    Dyspnea    with asthma flair   Obesity    Respiratory arrest ([place]) 04/04/2020    Current Outpatient Medications  Medication Sig Dispense Refill   albuterol (VENTOLIN HFA) 108 (90 Base) MCG/ACT inhaler Inhale 2 puffs into the lungs every 4  (four) hours as needed for wheezing or shortness of breath. 8.5 each 5   bisoprolol (ZEBETA) 5 MG tablet Take 0.5 tablets (2.5 mg total) by mouth daily. 45 tablet 3   buPROPion (WELLBUTRIN XL) 150 MG 24 hr tablet TAKE 1 TABLET BY MOUTH EVERY DAY IN THE MORNING 90 tablet 1   cetirizine (ZYRTEC) 10 MG tablet Take 10 mg by mouth daily as needed for allergies or rhinitis.     empagliflozin (JARDIANCE) 10 MG TABS tablet Take 1 tablet (10 mg total) by mouth daily. 30 tablet 3   EPINEPHrine 0.3 mg/0.3 mL IJ SOAJ injection Inject 0.3 mg into the muscle as needed for anaphylaxis. 2 each 5   Fluticasone-Umeclidin-Vilant (TRELEGY ELLIPTA IN) Inhale 1 puff into the lungs daily.     furosemide (LASIX) 20 MG tablet Take 1 tablet (20 mg total) by mouth daily. 30 tablet 4   ipratropium-albuterol (DUONEB) 0.5-2.5 (3) MG/3ML SOLN INHALE 1 VIAL BY MOUTH VIA NEBULIZER EVERY 6 HOURS AS NEEDED 1080 mL 5   montelukast (SINGULAIR) 10 MG tablet Take 1 tablet (10 mg total) by mouth at bedtime. TAKE 1 TABLET BY MOUTH EVERYDAY AT BEDTIME Strength: 10 mg 90 tablet 1   nicotine (NICODERM CQ - DOSED IN MG/24 HOURS) 14 mg/24hr patch Place 1 patch (14 mg total) onto the skin daily. 30 patch 0   nicotine polacrilex (NICORETTE) 4 MG lozenge Take 4 mg by mouth as needed for smoking cessation.     sacubitril-valsartan (ENTRESTO) 24-26 MG Take 1 tablet by mouth 2 (two) times  daily. 60 tablet 3   spironolactone (ALDACTONE) 25 MG tablet Take 0.5 tablets (12.5 mg total) by mouth daily. 30 tablet 3   No current facility-administered medications for this encounter.    Allergies  Allergen Reactions   Penicillins Other (See Comments)    Childhood Allergy Did it involve swelling of the face/tongue/throat, SOB, or low BP? UNK Did it involve sudden or severe rash/hives, skin peeling, or any reaction on the inside of your mouth or nose? UNK Did you need to seek medical attention at a hospital or doctor's office? UNK When did it last  happen?      more than 10 years If all above answers are "NO", may proceed with cephalosporin use.    Pineapple Other (See Comments)    Tested allergic to this      Social History   Socioeconomic History   Marital status: Single    Spouse name: Not on file   Number of children: Not on file   Years of education: Not on file   Highest education level: Not on file  Occupational History   Occupation: customer service rep  Tobacco Use   Smoking status: Light Smoker    Packs/day: 0.25    Years: 15.00    Total pack years: 3.75    Types: Cigarettes    Last attempt to quit: 09/16/2014    Years since quitting: 8.1   Smokeless tobacco: Never   Tobacco comments:    she continues to smoke 1-2 cigarettes a day  Vaping Use   Vaping Use: Never used  Substance and Sexual Activity   Alcohol use: Yes    Alcohol/week: 0.0 standard drinks of alcohol    Comment: occas.   Drug use: Yes    Types: Marijuana    Comment: occas.   Sexual activity: Not Currently  Other Topics Concern   Not on file  Social History Narrative   DIET: regular      DO YOU DRINK/EAT THINGS WITH CAFFEINE: Yes      MARITAL STATUS: Single      WHAT YEAR WERE YOU MARRIED:       DO YOU LIVE IN A HOUSE, APARTMENT, ASSISTED LIVING, CONDO TRAILER ETC.:  House       IS IT ONE OR MORE STORIES: 1      HOW MANY PERSONS LIVE IN YOUR HOME: 3      DO YOU HAVE PETS IN YOUR HOME: No      CURRENT OR PAST PROFESSION: customer Service Rep      DO YOU EXERCISE: very little      WHAT TYPE AND HOW OFTEN:   Social Determinants of Health   Financial Resource Strain: Not on file  Food Insecurity: No Food Insecurity (06/08/2022)   Hunger Vital Sign    Worried About Running Out of Food in the Last Year: Never true    Ran Out of Food in the Last Year: Never true  Transportation Needs: No Transportation Needs (06/08/2022)   PRAPARE - Hydrologist (Medical): No    Lack of Transportation  (Non-Medical): No  Physical Activity: Not on file  Stress: Not on file  Social Connections: Not on file  Intimate Partner Violence: Not At Risk (06/08/2022)   Humiliation, Afraid, Rape, and Kick questionnaire    Fear of Current or Ex-Partner: No    Emotionally Abused: No    Physically Abused: No    Sexually Abused: No  Family History  Problem Relation Age of Onset   Diabetes Mother    Cancer Mother    Diabetes Sister    Hypertension Sister    Hypertension Sister    Urticaria Neg Hx    Immunodeficiency Neg Hx    Eczema Neg Hx    Asthma Neg Hx    Angioedema Neg Hx    Allergic rhinitis Neg Hx     PHYSICAL EXAM: Vitals:   10/25/22 1354  BP: (!) 90/50  Pulse: 92  SpO2: 93%   GENERAL: AAF sitting comfortably; NAD.  HEENT: Negative for arcus senilis or xanthelasma. There is no scleral icterus.  The mucous membranes are pink and moist.   NECK: Supple, No masses. Normal carotid upstrokes without bruits. No masses or thyromegaly.    CHEST: There are no chest wall deformities. There is no chest wall tenderness. Respirations are unlabored.  Lungs- CTA B/L CARDIAC:  JVP: difficult to assess, 8-10         Normal S1, S2  Normal rate with regular rhythm. No murmurs, rubs or gallops.  Pulses are 2+ and symmetrical in upper and lower extremities. + 2 pitting edema.  ABDOMEN: Soft, non-tender, non-distended. There are no masses or hepatomegaly. There are normal bowel sounds.  EXTREMITIES: warm, well perfused; 2+ edema. LYMPHATIC: No axillary or supraclavicular lymphadenopathy.  NEUROLOGIC: Patient is oriented x3 with no focal or lateralizing neurologic deficits.  PSYCH: Patients affect is appropriate, there is no evidence of anxiety or depression.  SKIN: Warm and dry; no lesions or wounds.   DATA REVIEW  ECG: 06/28/22: NSR  ECHO: 06/09/22: LVEF 35%, GII DD.   CATH: R/LHC, 06/11/22: 1.  Normal right dominant circulation. 2.  Fick cardiac output of 9.9 L/min, cardiac index of  4.2 L/min/m, with a mean RA pressure of 3 mmHg, mean wedge pressure of 5 mmHg, and LVEDP of 7 mmHg.  CMR:  07/13/22: mildly dilated LV w/ LVEF 44%, normal RV function, mild biatrial enlargement  ASSESSMENT & PLAN:  NYHA II, HFpEF/HFrEF, nonischemic cardiomyopathy Etiology of PY:[id number], HFpEF component likely secondary to obesity, sleep apnea, hx of tobacco use,  NYHA class / AHA Stage:II-III Volume status & Diuretics: mildly hypervolemic, increase lasix to '40mg'$  x 3 days; instructed to dose lasix according to weights, edema, SOB. Vasodilators:Entresto 24/'26mg'$  BID Beta-Blocker:  continue bisoprolol 2.'5mg'$  bid WPY:[id number] 12.'5mg'$  daily Cardiometabolic:Jardiance '10mg'$  daily Devices therapies & Valvulopathies:not indicated at this time Advanced therapies:Not indicated at this time. CMR w/ improvement in LVEF to 45%. Will defer further management to general cardiology.   2. Obstructive sleep apnea - Discussed importance of CPAP compliance  3. Obesity - BMI 60 - Refer to lifestyle/weight loss clinic.  - discussed importance of weight loss.   4. SOB - Hx of COPD/asthma - At this time I do not believe her episodes of shortness of breat are secondary to heart failure; she is only mildly hypervolemic on exam and SOB appears to resolve with IV steroids / nebulizer. She continue to smoke cigarettes which is likely a culprit in her continued respiratory distress.   5. Tobacco use - discussed importance of cessation at length.   Jenipher Havel Advanced Heart Failure Mechanical Circulatory Support

## 2022-10-27 ENCOUNTER — Other Ambulatory Visit: Payer: Self-pay | Admitting: Nurse Practitioner

## 2022-10-27 ENCOUNTER — Ambulatory Visit: Payer: BC Managed Care – PPO | Admitting: Dietician

## 2022-10-27 DIAGNOSIS — F3289 Other specified depressive episodes: Secondary | ICD-10-CM

## 2022-10-27 MED ORDER — BUPROPION HCL ER (XL) 150 MG PO TB24: ORAL_TABLET | ORAL | 1 refills | Status: DC

## 2022-10-27 NOTE — Progress Notes (Signed)
HPI F smoker followed for Asthma, COPD, Chronic Rhinitis, Urticaria, Morbid Obesity, Iron Def Anemia Office spirometry 2017 by [facility]- mild obstruction PFT 05/01/14- Severe obstruction. 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 IgE 234,  EOS low on repeated steroid bursts (04/04/19) -----------------------------------------------------------------------------  02/06/20-  Virtual Visit via Telephone Note  I connected with April Ellis on 02/06/20 at 11:30 AM EDT by telephone and verified that I am speaking with the correct person using two identifiers.  Location: Patient: H Provider: O   I discussed the limitations, risks, security and privacy concerns of performing an evaluation and management service by telephone and the availability of in person appointments. I also discussed with the patient that there may be a patient responsible charge related to this service. The patient expressed understanding and agreed to proceed.   History of Present Illness:  38  F smoker followed for Asthma/ Xolair, COPD, Chronic Rhinitis, Urticaria,  OSA,  Morbid Obesity, Iron Def Anemia Albuterol hfa, Symbicort 160, neb Duocneb, Nicoderm CQ/ Chantix, Xolair, Spiriva,  Doing" a lot better". Credits Symbicort, working well for her. No receent flares, ED visits or prednisone. Xolair was ineffective. She has been approved to switch to [employer] and staff will be in touch with her about getting that started. She understands her insurance may want her to give self-injection, which should be ok.  Complex sleep apnea byhST. She was noncompliant with CPAP and DME took it away. She is willing to try again with reorder.   Observations/Objective:   Assessment and Plan: Asthma-COPD Overlap- much better control. Ok to start Berna Bue when  available Tobacco use- Has used Nicoderm and Chantix. Pres to keep trying.   Follow Up Instructions: 3 months- [facility] assessment    I discussed the assessment and treatment plan with the patient. The patient was provided an opportunity to ask questions and all were answered. The patient agreed with the plan and demonstrated an understanding of the instructions.   The patient was advised to call back or seek an in-person evaluation if the symptoms worsen or if the condition fails to improve as anticipated.  I provided 21 minutes of non-face-to-face time during this encounter.   April Lyons, MD  10/28/22- 38  F Smoker (1-2 cigs/ day) followed for Asthma/ Xolair, COPD,  hx Cardiopulmonary Arrest, Chronic Rhinitis, Urticaria,  OSA,  Morbid Obesity, Iron Def Anemia, CHF, HTN, -Albuterol hfa, Singulair, Trelegy 200, neb Duoneb, Nicoderm CQ/,,  HST 07/16/22- Dr Radford Pax- AHI 12/ hr (no centrals), O2 desat to 81%/ < 88% for 76 minutes,  Hosp 11/19-11/20/23 Asthma-COPD overlap, Acute Resp Fail, CHF, Polycythemia, Body weight today-327 lbs Covid vax-2 Phizer Flu vax-     declines                    Restarted on CPAP by cardiology after updated sleep study.  Never had home oxygen.  Dropped off Xolair about 2 years ago because of insurance problems before COVID. Just started Trelegy and using nebulizer about 2 times daily. She describes onset of asthma attack is noting itching in mouth and face, then becomes short of breath and takes Benadryl. Works from [employer]	[place].  Lives in house with no pets and no recognized mold problem. We will check labs for allergy markers, refill prescription for Trelegy and for nicotine patches.  Smoking cessation  emphasized. CXR 08/05/22- 1V- IMPRESSION: No active disease.              former Xolair- ? Restart Biologic   ROS-see HPI   + = positive Constitutional:    weight loss, night sweats, fevers, chills, fatigue, lassitude. HEENT:    headaches, difficulty  swallowing, tooth/dental problems, sore throat,       sneezing, itching, ear ache, nasal congestion, post nasal drip, snoring CV:    chest pain, orthopnea, PND, swelling in lower extremities, anasarca,                                   dizziness, palpitations Resp:  + shortness of breath with exertion or at rest.                productive cough,   +non-productive cough, coughing up of blood.              change in color of mucus.  +wheezing.   Skin:    rash or lesions. GI:  No-   heartburn, indigestion, abdominal pain, nausea, vomiting, diarrhea,                 change in bowel habits, loss of appetite GU: dysuria, change in color of urine, no urgency or frequency.   flank pain. MS:   joint pain, stiffness, decreased range of motion, back pain. Neuro-     nothing unusual Psych:  change in mood or affect.  depression or anxiety.   memory loss.  OBJ- Physical Exam General- Alert, Oriented, Affect-appropriate, Distress- none acute, +morbidly obese Skin- rash-none, lesions- none, excoriation- none Lymphadenopathy- none Head- atraumatic            Eyes- Gross vision intact, PERRLA, conjunctivae and secretions clear            Ears- Hearing, canals-normal            Nose- Clear, no-Septal dev, mucus, polyps, erosion, perforation             Throat- Mallampati III, mucosa clear , drainage- none, tonsils- atrophic Neck- flexible , trachea midline, no stridor , thyroid nl, carotid no bruit Chest - symmetrical excursion , unlabored           Heart/CV- RRR , no murmur , no gallop  , no rub, nl s1 s2                           - JVD- none , edema- none, stasis changes- none, varices- none           Lung- Clear/ unlabored,  wheeze- none, cough- none , dullness-none, rub- none           Chest wall-  Abd-  Br/ Gen/ Rectal- Not done, not indicated Extrem- cyanosis- none, clubbing, none, atrophy- none, strength- nl Neuro- grossly intact to observation

## 2022-10-28 ENCOUNTER — Encounter: Payer: Self-pay | Admitting: Internal Medicine

## 2022-10-28 ENCOUNTER — Ambulatory Visit (INDEPENDENT_AMBULATORY_CARE_PROVIDER_SITE_OTHER): Payer: BC Managed Care – PPO | Admitting: Internal Medicine

## 2022-10-28 VITALS — BP 108/70 | HR 86 | Ht 61.0 in | Wt 327.4 lb

## 2022-10-28 DIAGNOSIS — I5022 Chronic systolic (congestive) heart failure: Secondary | ICD-10-CM | POA: Diagnosis not present

## 2022-10-28 DIAGNOSIS — F17210 Nicotine dependence, cigarettes, uncomplicated: Secondary | ICD-10-CM | POA: Diagnosis not present

## 2022-10-28 DIAGNOSIS — J4489 Other specified chronic obstructive pulmonary disease: Secondary | ICD-10-CM

## 2022-10-28 MED ORDER — TRELEGY ELLIPTA 200-62.5-25 MCG/ACT IN AEPB: INHALATION_SPRAY | RESPIRATORY_TRACT | 12 refills | Status: DC

## 2022-10-28 MED ORDER — NICOTINE 14 MG/24HR TD PT24: 14.0000 mg | MEDICATED_PATCH | TRANSDERMAL | 5 refills | Status: DC

## 2022-10-28 NOTE — Patient Instructions (Signed)
Nicoderm CQ refilled. Really important to help you stop smoking !!!  Trelegy 200 refilled  Order- lab- CBC w diff, IgE, alpha-1-antitrypsin phenotype       dx asthma-copd overlap   Please call if we can help

## 2022-10-30 DIAGNOSIS — R000 Tachycardia, unspecified: Secondary | ICD-10-CM | POA: Diagnosis not present

## 2022-10-30 DIAGNOSIS — R0789 Other chest pain: Secondary | ICD-10-CM | POA: Diagnosis not present

## 2022-10-30 DIAGNOSIS — R079 Chest pain, unspecified: Secondary | ICD-10-CM | POA: Diagnosis not present

## 2022-10-30 DIAGNOSIS — R0902 Hypoxemia: Secondary | ICD-10-CM | POA: Diagnosis not present

## 2022-11-01 ENCOUNTER — Other Ambulatory Visit (INDEPENDENT_AMBULATORY_CARE_PROVIDER_SITE_OTHER): Payer: BC Managed Care – PPO

## 2022-11-01 DIAGNOSIS — I5030 Unspecified diastolic (congestive) heart failure: Secondary | ICD-10-CM | POA: Insufficient documentation

## 2022-11-01 DIAGNOSIS — D649 Anemia, unspecified: Secondary | ICD-10-CM | POA: Insufficient documentation

## 2022-11-01 DIAGNOSIS — Z800 Family history of malignant neoplasm of digestive organs: Secondary | ICD-10-CM | POA: Insufficient documentation

## 2022-11-01 DIAGNOSIS — J4489 Other specified chronic obstructive pulmonary disease: Secondary | ICD-10-CM

## 2022-11-01 LAB — CBC WITH DIFFERENTIAL/PLATELET
Basophils Absolute: 0 10*3/uL (ref 0.0–0.1)
Basophils Relative: 0.4 % (ref 0.0–3.0)
Eosinophils Absolute: 0 10*3/uL (ref 0.0–0.7)
Eosinophils Relative: 0 % (ref 0.0–5.0)
HCT: 48.9 % — ABNORMAL HIGH (ref 36.0–46.0)
Hemoglobin: 16.1 g/dL — ABNORMAL HIGH (ref 12.0–15.0)
Lymphocytes Relative: 7.5 % — ABNORMAL LOW (ref 12.0–46.0)
Lymphs Abs: 0.8 10*3/uL (ref 0.7–4.0)
MCHC: 33 g/dL (ref 30.0–36.0)
MCV: 92.4 fl (ref 78.0–100.0)
Monocytes Absolute: 0.4 10*3/uL (ref 0.1–1.0)
Monocytes Relative: 3.9 % (ref 3.0–12.0)
Neutro Abs: 9.8 10*3/uL — ABNORMAL HIGH (ref 1.4–7.7)
Neutrophils Relative %: 88.2 % — ABNORMAL HIGH (ref 43.0–77.0)
Platelets: 215 10*3/uL (ref 150.0–400.0)
RBC: 5.29 Mil/uL — ABNORMAL HIGH (ref 3.87–5.11)
RDW: 13.3 % (ref 11.5–15.5)
WBC: 11.2 10*3/uL — ABNORMAL HIGH (ref 4.0–10.5)

## 2022-11-02 ENCOUNTER — Ambulatory Visit (INDEPENDENT_AMBULATORY_CARE_PROVIDER_SITE_OTHER): Payer: BC Managed Care – PPO | Admitting: Nurse Practitioner

## 2022-11-02 ENCOUNTER — Encounter: Payer: Self-pay | Admitting: Nurse Practitioner

## 2022-11-02 VITALS — BP 122/68 | HR 73 | Temp 98.1°F | Ht 61.0 in | Wt 330.0 lb

## 2022-11-02 DIAGNOSIS — D509 Iron deficiency anemia, unspecified: Secondary | ICD-10-CM

## 2022-11-02 DIAGNOSIS — Z0000 Encounter for general adult medical examination without abnormal findings: Secondary | ICD-10-CM

## 2022-11-02 DIAGNOSIS — I10 Essential (primary) hypertension: Secondary | ICD-10-CM

## 2022-11-02 DIAGNOSIS — J441 Chronic obstructive pulmonary disease with (acute) exacerbation: Secondary | ICD-10-CM

## 2022-11-02 DIAGNOSIS — Z0001 Encounter for general adult medical examination with abnormal findings: Secondary | ICD-10-CM | POA: Diagnosis not present

## 2022-11-02 DIAGNOSIS — I5042 Chronic combined systolic (congestive) and diastolic (congestive) heart failure: Secondary | ICD-10-CM | POA: Diagnosis not present

## 2022-11-02 DIAGNOSIS — G4733 Obstructive sleep apnea (adult) (pediatric): Secondary | ICD-10-CM

## 2022-11-02 DIAGNOSIS — K029 Dental caries, unspecified: Secondary | ICD-10-CM

## 2022-11-02 DIAGNOSIS — J4489 Other specified chronic obstructive pulmonary disease: Secondary | ICD-10-CM

## 2022-11-02 DIAGNOSIS — Z6841 Body Mass Index (BMI) 40.0 and over, adult: Secondary | ICD-10-CM

## 2022-11-02 DIAGNOSIS — I110 Hypertensive heart disease with heart failure: Secondary | ICD-10-CM | POA: Diagnosis not present

## 2022-11-02 DIAGNOSIS — F3289 Other specified depressive episodes: Secondary | ICD-10-CM

## 2022-11-02 LAB — IGE: IgE (Immunoglobulin E), Serum: 332 kU/L — ABNORMAL HIGH (ref <=114)

## 2022-11-02 MED ORDER — BUPROPION HCL ER (XL) 300 MG PO TB24: ORAL_TABLET | ORAL | 1 refills | Status: DC

## 2022-11-02 MED ORDER — WEGOVY 0.25 MG/0.5ML ~~LOC~~ SOAJ: 0.2500 mg | SUBCUTANEOUS | 0 refills | Status: DC

## 2022-11-02 NOTE — Progress Notes (Signed)
I,Conna Terada,acting as a Education administrator for [name], FNP.,have documented all relevant documentation on the behalf of [name], FNP,as directed by  [name], FNP while in the presence of [name], [place].   Subjective:     Patient ID: April Ellis , female    DOB: June 15, 1976 , 47 y.o.   MRN: [id number]   Chief Complaint  Patient presents with   Annual Exam    HPI  Patient presents today for annual exam. She does have to see GYN next week - with Dr. Matthew Saras. She is out of work for 6 more months, depending on how well she does she may be able to come off of some of her medications. She has been [facility] to [facility]. She is now on Trelogy and stopped symbicort - by the Pulmonologist. She went to the dentist this morning.   She is now wearing a CPAP, she is trying to get in at least 4 hours per night. She is now renting to purchase. She has tried several mask.   Wt Readings from Last 3 Encounters: 11/02/22 : (!) 330 lb (149.7 kg) 10/28/22 : (!) 327 lb 6.4 oz (148.5 kg) 10/25/22 : (!) 325 lb 12.8 oz (147.8 kg)         Past Medical History:  Diagnosis Date   Anemia    Asthma    Bronchitis    COPD (chronic obstructive pulmonary disease) (HCC)    Dyspnea    with asthma flair   Obesity    Respiratory arrest ([place]) 04/04/2020     Family History  Problem Relation Age of Onset   Diabetes Mother    Cancer Mother    Diabetes Sister    Hypertension Sister    Hypertension Sister    Urticaria Neg Hx    Immunodeficiency Neg Hx    Eczema Neg Hx    Asthma Neg Hx    Angioedema Neg Hx    Allergic rhinitis Neg Hx      Current Outpatient Medications:    albuterol (VENTOLIN HFA) 108 (90 Base) MCG/ACT inhaler, Inhale 2 puffs into the lungs every 4 (four) hours as needed for wheezing or shortness of breath., Disp: 8.5 each, Rfl: 5   bisoprolol (ZEBETA) 5 MG tablet, Take 0.5 tablets (2.5 mg total) by mouth daily., Disp: 45 tablet, Rfl: 3   cetirizine  (ZYRTEC) 10 MG tablet, Take 10 mg by mouth daily as needed for allergies or rhinitis., Disp: , Rfl:    empagliflozin (JARDIANCE) 10 MG TABS tablet, Take 1 tablet (10 mg total) by mouth daily., Disp: 30 tablet, Rfl: 3   EPINEPHrine 0.3 mg/0.3 mL IJ SOAJ injection, Inject 0.3 mg into the muscle as needed for anaphylaxis., Disp: 2 each, Rfl: 5   Fluticasone-Umeclidin-Vilant (TRELEGY ELLIPTA) 200-62.5-25 MCG/ACT AEPB, Inhale 1 puff then rinse mouth, once daily, Disp: 60 each, Rfl: 12   furosemide (LASIX) 20 MG tablet, Take 1 tablet (20 mg total) by mouth daily., Disp: 30 tablet, Rfl: 4   guaiFENesin (MUCINEX) 600 MG 12 hr tablet, Take 600 mg by mouth 2 (two) times daily., Disp: , Rfl:    ipratropium-albuterol (DUONEB) 0.5-2.5 (3) MG/3ML SOLN, INHALE 1 VIAL BY MOUTH VIA NEBULIZER EVERY 6 HOURS AS NEEDED, Disp: 1080 mL, Rfl: 5   montelukast (SINGULAIR) 10 MG tablet, Take 1 tablet (10 mg total) by mouth at bedtime. TAKE 1 TABLET BY MOUTH EVERYDAY AT BEDTIME Strength: 10 mg, Disp: 90 tablet, Rfl: 1   nicotine (  NICODERM CQ - DOSED IN MG/24 HOURS) 14 mg/24hr patch, Place 1 patch (14 mg total) onto the skin daily., Disp: 30 patch, Rfl: 5   nicotine polacrilex (NICORETTE) 4 MG lozenge, Take 4 mg by mouth as needed for smoking cessation., Disp: , Rfl:    predniSONE (DELTASONE) 20 MG tablet, Take 20 mg by mouth 2 (two) times daily with a meal., Disp: , Rfl:    sacubitril-valsartan (ENTRESTO) 24-26 MG, Take 1 tablet by mouth 2 (two) times daily., Disp: 60 tablet, Rfl: 3   Semaglutide-Weight Management (WEGOVY) 0.25 MG/0.5ML SOAJ, Inject 0.25 mg into the skin once a week. (Patient not taking: Reported on 11/11/2022), Disp: 2 mL, Rfl: 0   spironolactone (ALDACTONE) 25 MG tablet, Take 0.5 tablets (12.5 mg total) by mouth daily., Disp: 30 tablet, Rfl: 3   buPROPion (WELLBUTRIN XL) 300 MG 24 hr tablet, TAKE 1 TABLET BY MOUTH EVERY DAY IN THE MORNING, Disp: 90 tablet, Rfl: 1   Allergies  Allergen Reactions   Penicillins  Other (See Comments)    Childhood Allergy Did it involve swelling of the face/tongue/throat, SOB, or low BP? UNK Did it involve sudden or severe rash/hives, skin peeling, or any reaction on the inside of your mouth or nose? UNK Did you need to seek medical attention at a hospital or doctor's office? UNK When did it last happen?      more than 10 years If all above answers are "NO", may proceed with cephalosporin use.    Pineapple Other (See Comments)    Tested allergic to this      The patient states she uses  ablation  for birth control.  No LMP recorded. Patient has had an ablation.  Negative for Dysmenorrhea and Negative for Menorrhagia. Negative for: breast discharge, breast lump(s), breast pain and breast self exam. Associated symptoms include abnormal vaginal bleeding. Pertinent negatives include abnormal bleeding (hematology), anxiety, decreased libido, depression, difficulty falling sleep, dyspareunia, history of infertility, nocturia, sexual dysfunction, sleep disturbances, urinary incontinence, urinary urgency, vaginal discharge and vaginal itching. Diet regular. The patient states her exercise level is    . The patient's tobacco use is:  Social History   Tobacco Use  Smoking Status Light Smoker   Packs/day: 0.25   Years: 15.00   Total pack years: 3.75   Types: Cigarettes  Smokeless Tobacco Never  Tobacco Comments   Smokes 1-2 cigarettes per day PAP 10/28/2022, she is currently trying to quit smoking 11/02/2022  . She has been exposed to passive smoke. The patient's alcohol use is:  Social History   Substance and Sexual Activity  Alcohol Use Yes   Alcohol/week: 0.0 standard drinks of alcohol   Comment: occas.  Additional information: next one scheduled for next week.    Review of Systems  Constitutional: Negative.   HENT: Negative.    Eyes: Negative.   Respiratory: Negative.    Cardiovascular: Negative.   Gastrointestinal: Negative.   Endocrine: Negative.    Genitourinary: Negative.   Musculoskeletal: Negative.   Skin: Negative.   Allergic/Immunologic: Negative.   Neurological: Negative.   Hematological: Negative.   Psychiatric/Behavioral: Negative.       Today's Vitals   11/02/22 1452  BP: 122/68  Pulse: 73  Temp: 98.1 F (36.7 C)  TempSrc: Oral  SpO2: 96%  Weight: (!) 330 lb (149.7 kg)  Height: 5' 1"$  (1.549 m)   Body mass index is 62.35 kg/m.   Objective:  Physical Exam Vitals reviewed.  Constitutional:  General: She is not in acute distress.    Appearance: Normal appearance. She is well-developed. She is obese.  HENT:     Head: Normocephalic and atraumatic.     Right Ear: Hearing, tympanic membrane, ear canal and external ear normal. There is no impacted cerumen.     Left Ear: Hearing, tympanic membrane, ear canal and external ear normal. There is no impacted cerumen.     Nose:     Comments: Deferred - masked    Mouth/Throat:     Comments: Deferred - masked Eyes:     General: Lids are normal.     Extraocular Movements: Extraocular movements intact.     Conjunctiva/sclera: Conjunctivae normal.     Pupils: Pupils are equal, round, and reactive to light.     Funduscopic exam:    Right eye: No papilledema.        Left eye: No papilledema.  Neck:     Thyroid: No thyroid mass.     Vascular: No carotid bruit.  Cardiovascular:     Rate and Rhythm: Normal rate and regular rhythm.     Pulses: Normal pulses.     Heart sounds: Normal heart sounds. No murmur heard. Pulmonary:     Effort: Pulmonary effort is normal.     Breath sounds: Normal breath sounds.  Chest:     Chest wall: No mass.  Breasts:    Tanner Score is 5.     Right: Normal. No mass or tenderness.     Left: Normal. No mass or tenderness.  Abdominal:     General: Abdomen is flat. Bowel sounds are normal. There is no distension.     Palpations: Abdomen is soft.     Tenderness: There is no abdominal tenderness.  Musculoskeletal:        General: No  swelling. Normal range of motion.     Cervical back: Full passive range of motion without pain, normal range of motion and neck supple.     Right lower leg: No edema.     Left lower leg: No edema.  Lymphadenopathy:     Upper Body:     Right upper body: No supraclavicular, axillary or pectoral adenopathy.     Left upper body: No supraclavicular, axillary or pectoral adenopathy.  Skin:    General: Skin is warm and dry.     Capillary Refill: Capillary refill takes less than 2 seconds.  Neurological:     General: No focal deficit present.     Mental Status: She is alert and oriented to person, place, and time.     Cranial Nerves: No cranial nerve deficit.     Sensory: No sensory deficit.     Motor: No weakness.  Psychiatric:        Mood and Affect: Mood normal.        Behavior: Behavior normal.        Thought Content: Thought content normal.        Judgment: Judgment normal.         Assessment And Plan:     1. Encounter for health maintenance examination  2. Class 3 severe obesity due to excess calories without serious comorbidity with body mass index (BMI) of 60.0 to 69.9 in adult [facility]) Comments: Will try to get her approved for [facility].  Pres[facility] to [facility].  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  Discussed side effects to include nausea and constipation. Denies history of family history of medullary  thyroid cancer or pancreatitis - Semaglutide-Weight Management (WEGOVY) 0.25 MG/0.5ML SOAJ; Inject 0.25 mg into the skin once a week. (Patient not taking: Reported on 11/11/2022)  Dispense: 2 mL; Refill: 0  3. Essential hypertension Comments: Blood pressure is well-controlled.  Continue current medications and follow-up with cardiology  4. Obstructive sleep apnea treated with continuous positive airway pressure (CPAP) Comments: This is a new diagnosis and she is now wearing a CPAP machine she is wearing most nights  5. Asthma-COPD overlap  syndrome Comments: Continue follow-up with pulmonologist and allergist  6. Chronic combined systolic and diastolic CHF (congestive heart failure) (HCC) Comments: Continue follow-up with cardiology she is doing well at this time  7. Iron deficiency anemia, unspecified iron deficiency anemia type Comments: This has been stable.  8. Other depression Comments: She is doing better after being away for a little while, continue wellbutrin at higher dose. - buPROPion (WELLBUTRIN XL) 300 MG 24 hr tablet; TAKE 1 TABLET BY MOUTH EVERY DAY IN THE MORNING  Dispense: 90 tablet; Refill: 1  9. Dental caries Comments: right upper and lower molar is embedded, continue follow-up with dentist.  Discussed risk of heart disease with dental caries   Patient was given opportunity to ask questions. Patient verbalized understanding of the plan and was able to repeat key elements of the plan. All questions were answered to their satisfaction.   [name], FNP   I, [name], FNP, have reviewed all documentation for this visit. The documentation on 11/02/22 for the exam, diagnosis, procedures, and orders are all accurate and complete.   THE PATIENT IS ENCOURAGED TO PRACTICE SOCIAL DISTANCING DUE TO THE COVID-19 PANDEMIC.

## 2022-11-02 NOTE — Patient Instructions (Signed)
Health Maintenance, Female Adopting a healthy lifestyle and getting preventive care are important in promoting health and wellness. Ask your health care provider about: The right schedule for you to have regular tests and exams. Things you can do on your own to prevent diseases and keep yourself healthy. What should I know about diet, weight, and exercise? Eat a healthy diet  Eat a diet that includes plenty of vegetables, fruits, low-fat dairy products, and lean protein. Do not eat a lot of foods that are high in solid fats, added sugars, or sodium. Maintain a healthy weight Body mass index (BMI) is used to identify weight problems. It estimates body fat based on height and weight. Your health care provider can help determine your BMI and help you achieve or maintain a healthy weight. Get regular exercise Get regular exercise. This is one of the most important things you can do for your health. Most adults should: Exercise for at least 150 minutes each week. The exercise should increase your heart rate and make you sweat (moderate-intensity exercise). Do strengthening exercises at least twice a week. This is in addition to the moderate-intensity exercise. Spend less time sitting. Even light physical activity can be beneficial. Watch cholesterol and blood lipids Have your blood tested for lipids and cholesterol at 46 years of age, then have this test every 5 years. Have your cholesterol levels checked more often if: Your lipid or cholesterol levels are high. You are older than 46 years of age. You are at high risk for heart disease. What should I know about cancer screening? Depending on your health history and family history, you may need to have cancer screening at various ages. This may include screening for: Breast cancer. Cervical cancer. Colorectal cancer. Skin cancer. Lung cancer. What should I know about heart disease, diabetes, and high blood pressure? Blood pressure and heart  disease High blood pressure causes heart disease and increases the risk of stroke. This is more likely to develop in people who have high blood pressure readings or are overweight. Have your blood pressure checked: Every 3-5 years if you are 18-39 years of age. Every year if you are 40 years old or older. Diabetes Have regular diabetes screenings. This checks your fasting blood sugar level. Have the screening done: Once every three years after age 40 if you are at a normal weight and have a low risk for diabetes. More often and at a younger age if you are overweight or have a high risk for diabetes. What should I know about preventing infection? Hepatitis B If you have a higher risk for hepatitis B, you should be screened for this virus. Talk with your health care provider to find out if you are at risk for hepatitis B infection. Hepatitis C Testing is recommended for: Everyone born from 1945 through 1965. Anyone with known risk factors for hepatitis C. Sexually transmitted infections (STIs) Get screened for STIs, including gonorrhea and chlamydia, if: You are sexually active and are younger than 46 years of age. You are older than 46 years of age and your health care provider tells you that you are at risk for this type of infection. Your sexual activity has changed since you were last screened, and you are at increased risk for chlamydia or gonorrhea. Ask your health care provider if you are at risk. Ask your health care provider about whether you are at high risk for HIV. Your health care provider may recommend a prescription medicine to help prevent HIV   infection. If you choose to take medicine to prevent HIV, you should first get tested for HIV. You should then be tested every 3 months for as long as you are taking the medicine. Pregnancy If you are about to stop having your period (premenopausal) and you may become pregnant, seek counseling before you get pregnant. Take 400 to 800  micrograms (mcg) of folic acid every day if you become pregnant. Ask for birth control (contraception) if you want to prevent pregnancy. Osteoporosis and menopause Osteoporosis is a disease in which the bones lose minerals and strength with aging. This can result in bone fractures. If you are 39 years old or older, or if you are at risk for osteoporosis and fractures, ask your health care provider if you should: Be screened for bone loss. Take a calcium or vitamin D supplement to lower your risk of fractures. Be given hormone replacement therapy (HRT) to treat symptoms of menopause. Follow these instructions at home: Alcohol use Do not drink alcohol if: Your health care provider tells you not to drink. You are pregnant, may be pregnant, or are planning to become pregnant. If you drink alcohol: Limit how much you have to: 0-1 drink a day. Know how much alcohol is in your drink. In the [place] one drink equals one 12 oz bottle of beer (355 mL), one 5 oz glass of wine (148 mL), or one 1 oz glass of hard liquor (44 mL). Lifestyle Do not use any products that contain nicotine or tobacco. These products include cigarettes, chewing tobacco, and vaping devices, such as e-cigarettes. If you need help quitting, ask your health care provider. Do not use street drugs. Do not share needles. Ask your health care provider for help if you need support or information about quitting drugs. General instructions Schedule regular health, dental, and eye exams. Stay current with your vaccines. Tell your health care provider if: You often feel depressed. You have ever been abused or do not feel safe at home. Summary Adopting a healthy lifestyle and getting preventive care are important in promoting health and wellness. Follow your health care provider's instructions about healthy diet, exercising, and getting tested or screened for diseases. Follow your health care provider's instructions on monitoring your  cholesterol and blood pressure. This information is not intended to replace advice given to you by your health care provider. Make sure you discuss any questions you have with your health care provider. Document Revised: 02/02/2021 Document Reviewed: 02/02/2021 Elsevier Patient Education  [place].   Obesity, Adult Obesity is the condition of having too much total body fat. Being overweight or obese means that your weight is greater than what is considered healthy for your body size. Obesity is determined by a measurement called BMI (body mass index). BMI is an estimate of body fat and is calculated from height and weight. For adults, a BMI of 30 or higher is considered obese. Obesity can lead to other health concerns and major illnesses, including: Stroke. Coronary artery disease (CAD). Type 2 diabetes. Some types of cancer, including cancers of the colon, breast, uterus, and gallbladder. High blood pressure (hypertension). High cholesterol. Gallbladder stones. Obesity can also contribute to: Osteoarthritis. Sleep apnea. Infertility problems. What are the causes? Common causes of this condition include: Eating daily meals that are high in calories, sugar, and fat. Drinking high amounts of sugar-sweetened beverages, such as soft drinks. Being born with genes that may make you more likely to become obese. Having a medical condition that  causes obesity, including: Hypothyroidism. Polycystic ovarian syndrome (PCOS). Binge-eating disorder. Cushing syndrome. Taking certain medicines, such as steroids, antidepressants, and seizure medicines. Not being physically active (sedentary lifestyle). Not getting enough sleep. What increases the risk? The following factors may make you more likely to develop this condition: Having a family history of obesity. Living in an area with limited access to: [place], recreation centers, or sidewalks. Healthy food choices, such as grocery stores  and farmers' markets. What are the signs or symptoms? The main sign of this condition is having too much body fat. How is this diagnosed? This condition is diagnosed based on: Your BMI. If you are an adult with a BMI of 30 or higher, you are considered obese. Your waist circumference. This measures the distance around your waistline. Your skinfold thickness. Your health care provider may gently pinch a fold of your skin and measure it. You may have other tests to check for underlying conditions. How is this treated? Treatment for this condition often includes changing your lifestyle. Treatment may include some or all of the following: Dietary changes. This may include developing a healthy meal plan. Regular physical activity. This may include activity that causes your heart to beat faster (aerobic exercise) and strength training. Work with your health care provider to design an exercise program that works for you. Medicine to help you lose weight if you are unable to lose one pound a week after six weeks of healthy eating and more physical activity. Treating conditions that cause the obesity (underlying conditions). Surgery. Surgical options may include gastric banding and gastric bypass. Surgery may be done if: Other treatments have not helped to improve your condition. You have a BMI of 40 or higher. You have life-threatening health problems related to obesity. Follow these instructions at home: Eating and drinking  Follow recommendations from your health care provider about what you eat and drink. Your health care provider may advise you to: Limit fast food, sweets, and processed snack foods. Choose low-fat options, such as low-fat milk instead of whole milk. Eat five or more servings of fruits or vegetables every day. Choose healthy foods when you eat out. Keep low-fat snacks available. Limit sugary drinks, such as soda, fruit juice, sweetened iced tea, and flavored milk. Drink  enough water to keep your urine pale yellow. Do not follow a fad diet. Fad diets can be unhealthy and even dangerous. Other healthful choices include: Eat at home more often. This gives you more control over what you eat. Learn to read food labels. This will help you understand how much food is considered one serving. Learn what a healthy serving size is. Physical activity Exercise regularly, as told by your health care provider. Most adults should get up to 150 minutes of moderate-intensity exercise every week. Ask your health care provider what types of exercise are safe for you and how often you should exercise. Warm up and stretch before being active. Cool down and stretch after being active. Rest between periods of activity. Lifestyle Work with your health care provider and a dietitian to set a weight-loss goal that is healthy and reasonable for you. Limit your screen time. Find ways to reward yourself that do not involve food. Do not drink alcohol if: Your health care provider tells you not to drink. You are pregnant, may be pregnant, or are planning to become pregnant. If you drink alcohol: Limit how much you have to: 0-1 drink a day for women. 0-2 drinks a day for men. Know  how much alcohol is in your drink. In the [place] one drink equals one 12 oz bottle of beer (355 mL), one 5 oz glass of wine (148 mL), or one 1 oz glass of hard liquor (44 mL). General instructions Keep a weight-loss journal to keep track of the food you eat and how much exercise you get. Take over-the-counter and prescription medicines only as told by your health care provider. Take vitamins and supplements only as told by your health care provider. Consider joining a support group. Your health care provider may be able to recommend a support group. Pay attention to your mental health as obesity can lead to depression or self esteem issues. Keep all follow-up visits. This is important. Contact a health care  provider if: You are unable to meet your weight-loss goal after six weeks of dietary and lifestyle changes. You have trouble breathing. Summary Obesity is the condition of having too much total body fat. Being overweight or obese means that your weight is greater than what is considered healthy for your body size. Work with your health care provider and a dietitian to set a weight-loss goal that is healthy and reasonable for you. Exercise regularly, as told by your health care provider. Ask your health care provider what types of exercise are safe for you and how often you should exercise. This information is not intended to replace advice given to you by your health care provider. Make sure you discuss any questions you have with your health care provider. Document Revised: 04/21/2021 Document Reviewed: 04/21/2021 Elsevier Patient Education  [facility] will call you for the [facility] if approved. 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

## 2022-11-05 ENCOUNTER — Encounter: Payer: Self-pay | Admitting: Nurse Practitioner

## 2022-11-06 DIAGNOSIS — G4733 Obstructive sleep apnea (adult) (pediatric): Secondary | ICD-10-CM | POA: Diagnosis not present

## 2022-11-08 NOTE — Telephone Encounter (Signed)
Per Anderson Malta, RPht, patient's plan has an exlcusion for [place] and Saxenda. They will NOT cover either medication, a PA cannot even be initiated for them.   Please advise, thanks!

## 2022-11-11 ENCOUNTER — Encounter (HOSPITAL_COMMUNITY): Payer: Self-pay | Admitting: Gastroenterology

## 2022-11-12 ENCOUNTER — Other Ambulatory Visit: Payer: Self-pay

## 2022-11-12 ENCOUNTER — Emergency Department (HOSPITAL_COMMUNITY): Payer: BC Managed Care – PPO

## 2022-11-12 ENCOUNTER — Observation Stay (HOSPITAL_COMMUNITY)
Admission: EM | Admit: 2022-11-12 | Discharge: 2022-11-14 | Disposition: A | Discharge status: Home / Self Care | Payer: BC Managed Care – PPO | Attending: Internal Medicine | Admitting: Internal Medicine

## 2022-11-12 DIAGNOSIS — R55 Syncope and collapse: Secondary | ICD-10-CM | POA: Diagnosis present

## 2022-11-12 DIAGNOSIS — G9340 Encephalopathy, unspecified: Secondary | ICD-10-CM

## 2022-11-12 DIAGNOSIS — I5022 Chronic systolic (congestive) heart failure: Secondary | ICD-10-CM | POA: Diagnosis present

## 2022-11-12 DIAGNOSIS — J45909 Unspecified asthma, uncomplicated: Secondary | ICD-10-CM | POA: Diagnosis present

## 2022-11-12 DIAGNOSIS — Z1152 Encounter for screening for COVID-19: Secondary | ICD-10-CM | POA: Insufficient documentation

## 2022-11-12 DIAGNOSIS — F17210 Nicotine dependence, cigarettes, uncomplicated: Secondary | ICD-10-CM | POA: Diagnosis not present

## 2022-11-12 DIAGNOSIS — J45901 Unspecified asthma with (acute) exacerbation: Secondary | ICD-10-CM | POA: Diagnosis not present

## 2022-11-12 DIAGNOSIS — R0602 Shortness of breath: Secondary | ICD-10-CM | POA: Diagnosis not present

## 2022-11-12 DIAGNOSIS — J449 Chronic obstructive pulmonary disease, unspecified: Secondary | ICD-10-CM | POA: Insufficient documentation

## 2022-11-12 DIAGNOSIS — Z79899 Other long term (current) drug therapy: Secondary | ICD-10-CM | POA: Diagnosis not present

## 2022-11-12 DIAGNOSIS — R000 Tachycardia, unspecified: Secondary | ICD-10-CM | POA: Diagnosis not present

## 2022-11-12 LAB — CBC WITH DIFFERENTIAL/PLATELET
Abs Immature Granulocytes: 0 10*3/uL (ref 0.00–0.07)
Basophils Absolute: 0.8 10*3/uL — ABNORMAL HIGH (ref 0.0–0.1)
Basophils Relative: 4 %
Eosinophils Absolute: 0 10*3/uL (ref 0.0–0.5)
Eosinophils Relative: 0 %
HCT: 49.8 % — ABNORMAL HIGH (ref 36.0–46.0)
Hemoglobin: 15.2 g/dL — ABNORMAL HIGH (ref 12.0–15.0)
Lymphocytes Relative: 38 %
Lymphs Abs: 7.4 10*3/uL — ABNORMAL HIGH (ref 0.7–4.0)
MCH: 29.9 pg (ref 26.0–34.0)
MCHC: 30.5 g/dL (ref 30.0–36.0)
MCV: 98 fL (ref 80.0–100.0)
Monocytes Absolute: 1.6 10*3/uL — ABNORMAL HIGH (ref 0.1–1.0)
Monocytes Relative: 8 %
Neutro Abs: 9.8 10*3/uL — ABNORMAL HIGH (ref 1.7–7.7)
Neutrophils Relative %: 50 %
Platelets: 227 10*3/uL (ref 150–400)
RBC: 5.08 MIL/uL (ref 3.87–5.11)
RDW: 12.8 % (ref 11.5–15.5)
WBC: 19.6 10*3/uL — ABNORMAL HIGH (ref 4.0–10.5)
nRBC: 0 % (ref 0.0–0.2)

## 2022-11-12 LAB — COMPREHENSIVE METABOLIC PANEL
ALT: 17 U/L (ref 0–44)
AST: 25 U/L (ref 15–41)
Albumin: 3.4 g/dL — ABNORMAL LOW (ref 3.5–5.0)
Alkaline Phosphatase: 80 U/L (ref 38–126)
Anion gap: 10 (ref 5–15)
BUN: 12 mg/dL (ref 6–20)
CO2: 24 mmol/L (ref 22–32)
Calcium: 8.3 mg/dL — ABNORMAL LOW (ref 8.9–10.3)
Chloride: 100 mmol/L (ref 98–111)
Creatinine, Ser: 1.09 mg/dL — ABNORMAL HIGH (ref 0.44–1.00)
GFR, Estimated: 34 mL/min — ABNORMAL LOW (ref >60)
Glucose, Bld: 219 mg/dL — ABNORMAL HIGH (ref 70–99)
Potassium: 4.3 mmol/L (ref 3.5–5.1)
Sodium: 134 mmol/L — ABNORMAL LOW (ref 135–145)
Total Bilirubin: 0.6 mg/dL (ref 0.3–1.2)
Total Protein: 6.7 g/dL (ref 6.5–8.1)

## 2022-11-12 LAB — I-STAT VENOUS BLOOD GAS, ED
Acid-base deficit: 4 mmol/L — ABNORMAL HIGH (ref 0.0–2.0)
Bicarbonate: 26.4 mmol/L (ref 20.0–28.0)
Calcium, Ion: 1.17 mmol/L (ref 1.15–1.40)
HCT: 49 % — ABNORMAL HIGH (ref 36.0–46.0)
Hemoglobin: 16.7 g/dL — ABNORMAL HIGH (ref 12.0–15.0)
O2 Saturation: 98 %
Potassium: 4.3 mmol/L (ref 3.5–5.1)
Sodium: 136 mmol/L (ref 135–145)
TCO2: 29 mmol/L (ref 22–32)
pCO2, Ven: 73.3 mmHg (ref 44–60)
pH, Ven: 7.164 — CL (ref 7.25–7.43)
pO2, Ven: 139 mmHg — ABNORMAL HIGH (ref 32–45)

## 2022-11-12 LAB — PROTIME-INR
INR: 1 (ref 0.8–1.2)
Prothrombin Time: 12.6 seconds (ref 11.4–15.2)

## 2022-11-12 LAB — ETHANOL: Alcohol, Ethyl (B): <10 mg/dL (ref <10)

## 2022-11-12 LAB — SALICYLATE LEVEL: Salicylate Lvl: <7 mg/dL — ABNORMAL LOW (ref 7.0–30.0)

## 2022-11-12 LAB — ACETAMINOPHEN LEVEL: Acetaminophen (Tylenol), Serum: <10 ug/mL — ABNORMAL LOW (ref 10–30)

## 2022-11-12 MED ORDER — NALOXONE HCL 2 MG/2ML IJ SOSY
PREFILLED_SYRINGE | INTRAMUSCULAR | Status: AC
  Administered 2022-11-12: 2 mg
  Filled 2022-11-12: qty 2

## 2022-11-12 MED ORDER — NALOXONE HCL 0.4 MG/ML IJ SOLN: 0.4000 mg | Freq: Once | INTRAMUSCULAR | Status: DC

## 2022-11-12 NOTE — ED Provider Notes (Signed)
  [place] Provider Note   CSN: [id number] Arrival date & time: 11/12/22  2209     History {Add pertinent medical, surgical, social history, OB history to HPI:1} Chief Complaint  Patient presents with  . Loss of Consciousness    [place] U Doe is a 47 y.o. female.  This is an approximately 79-46 year old female presenting to the ED for unresponsiveness.  Patient reportedly found down in her bathroom at home by family.  They heard a loud noise and went to check on her.  They state they do not know what happened and they deny any ingestions to their knowledge.  Upon arrival patient requiring supportive respirations with nonrebreather, after Narcan she became more responsive and denied any alcohol or illicit drug use.  She denies any chest pain or shortness of breath, no abdominal pain.       Home Medications Prior to Admission medications   Not on File      Allergies    Patient has no allergy information on record.    Review of Systems   Review of Systems  Physical Exam Updated Vital Signs There were no vitals taken for this visit. Physical Exam  ED Results / Procedures / Treatments   Labs (all labs ordered are listed, but only abnormal results are displayed) Labs Reviewed  RESP PANEL BY RT-PCR (RSV, FLU A&B, COVID)  [id number]  COMPREHENSIVE METABOLIC PANEL  ETHANOL  CBC WITH DIFFERENTIAL/PLATELET  PROTIME-INR  ACETAMINOPHEN LEVEL  SALICYLATE LEVEL  RAPID URINE DRUG SCREEN, HOSP PERFORMED  I-STAT VENOUS BLOOD GAS, ED    EKG None  Radiology No results found.  Procedures Procedures  {Document cardiac monitor, telemetry assessment procedure when appropriate:1}  Medications Ordered in ED Medications  naloxone (NARCAN) 2 MG/2ML injection (has no administration in time range)    ED Course/ Medical Decision Making/ A&P   {   Click here for ABCD2, HEART and other calculatorsREFRESH Note before signing :1}                           Medical Decision Making Amount and/or Complexity of Data Reviewed Labs: ordered. Radiology: ordered.   Differential diagnoses: Intracranial hemorrhage, seizure, syncope, acute alcohol intoxication, illicit drug intoxication  ***  {Document critical care time when appropriate:1} {Document review of labs and clinical decision tools ie heart score, Chads2Vasc2 etc:1}  {Document your independent review of radiology images, and any outside records:1} {Document your discussion with family members, caretakers, and with consultants:1} {Document social determinants of health affecting pt's care:1} {Document your decision making why or why not admission, treatments were needed:1} Final Clinical Impression(s) / ED Diagnoses Final diagnoses:  None    Rx / DC Orders ED Discharge Orders     None

## 2022-11-12 NOTE — ED Notes (Signed)
Patient transported to CT 

## 2022-11-12 NOTE — ED Notes (Addendum)
Attempted to take patient to CT after calling and verifying which CT room @ 2023, CT tech states "there is no order" once arrival to CT room. Patient was brought back to the room, orders verified and are in.

## 2022-11-12 NOTE — ED Triage Notes (Signed)
BIB EMS found laying in floor in bedroom, unresponsive. Family un able to provide any information. EPI pen found by patient on scene.   Last VS146/94, 114, 135 CBG

## 2022-11-12 NOTE — ED Notes (Signed)
Patient given Narcan IV.

## 2022-11-13 ENCOUNTER — Telehealth: Payer: Self-pay | Admitting: Internal Medicine

## 2022-11-13 DIAGNOSIS — J45909 Unspecified asthma, uncomplicated: Secondary | ICD-10-CM | POA: Diagnosis present

## 2022-11-13 DIAGNOSIS — R55 Syncope and collapse: Secondary | ICD-10-CM | POA: Diagnosis present

## 2022-11-13 DIAGNOSIS — I5022 Chronic systolic (congestive) heart failure: Secondary | ICD-10-CM | POA: Diagnosis present

## 2022-11-13 DIAGNOSIS — J4551 Severe persistent asthma with (acute) exacerbation: Secondary | ICD-10-CM

## 2022-11-13 LAB — GLUCOSE, CAPILLARY
Glucose-Capillary: 137 mg/dL — ABNORMAL HIGH (ref 70–99)
Glucose-Capillary: 113 mg/dL — ABNORMAL HIGH (ref 70–99)
Glucose-Capillary: 137 mg/dL — ABNORMAL HIGH (ref 70–99)

## 2022-11-13 LAB — I-STAT VENOUS BLOOD GAS, ED
Acid-Base Excess: 3 mmol/L — ABNORMAL HIGH (ref 0.0–2.0)
Bicarbonate: 31.7 mmol/L — ABNORMAL HIGH (ref 20.0–28.0)
Calcium, Ion: 1.13 mmol/L — ABNORMAL LOW (ref 1.15–1.40)
HCT: 49 % — ABNORMAL HIGH (ref 36.0–46.0)
Hemoglobin: 16.7 g/dL — ABNORMAL HIGH (ref 12.0–15.0)
O2 Saturation: 80 %
Potassium: 5.2 mmol/L — ABNORMAL HIGH (ref 3.5–5.1)
Sodium: 136 mmol/L (ref 135–145)
TCO2: 34 mmol/L — ABNORMAL HIGH (ref 22–32)
pCO2, Ven: 64.9 mmHg — ABNORMAL HIGH (ref 44–60)
pH, Ven: 7.297 (ref 7.25–7.43)
pO2, Ven: 50 mmHg — ABNORMAL HIGH (ref 32–45)

## 2022-11-13 LAB — RESP PANEL BY RT-PCR (RSV, FLU A&B, COVID)  RVPGX2
Influenza A by PCR: NEGATIVE
Influenza B by PCR: NEGATIVE
Resp Syncytial Virus by PCR: NEGATIVE
SARS Coronavirus 2 by RT PCR: NEGATIVE

## 2022-11-13 LAB — BASIC METABOLIC PANEL
Anion gap: 8 (ref 5–15)
BUN: 13 mg/dL (ref 6–20)
CO2: 26 mmol/L (ref 22–32)
Calcium: 8.7 mg/dL — ABNORMAL LOW (ref 8.9–10.3)
Chloride: 100 mmol/L (ref 98–111)
Creatinine, Ser: 1.03 mg/dL — ABNORMAL HIGH (ref 0.44–1.00)
GFR, Estimated: >60 mL/min (ref >60)
Glucose, Bld: 142 mg/dL — ABNORMAL HIGH (ref 70–99)
Potassium: 5.5 mmol/L — ABNORMAL HIGH (ref 3.5–5.1)
Sodium: 134 mmol/L — ABNORMAL LOW (ref 135–145)

## 2022-11-13 LAB — CBC
HCT: 49.2 % — ABNORMAL HIGH (ref 36.0–46.0)
Hemoglobin: 15.6 g/dL — ABNORMAL HIGH (ref 12.0–15.0)
MCH: 30.4 pg (ref 26.0–34.0)
MCHC: 31.7 g/dL (ref 30.0–36.0)
MCV: 95.9 fL (ref 80.0–100.0)
Platelets: 197 10*3/uL (ref 150–400)
RBC: 5.13 MIL/uL — ABNORMAL HIGH (ref 3.87–5.11)
RDW: 12.8 % (ref 11.5–15.5)
WBC: 12.6 10*3/uL — ABNORMAL HIGH (ref 4.0–10.5)
nRBC: 0 % (ref 0.0–0.2)

## 2022-11-13 LAB — RAPID URINE DRUG SCREEN, HOSP PERFORMED
Amphetamines: NOT DETECTED
Barbiturates: NOT DETECTED
Benzodiazepines: NOT DETECTED
Cocaine: NOT DETECTED
Opiates: NOT DETECTED
Tetrahydrocannabinol: NOT DETECTED

## 2022-11-13 LAB — I-STAT BETA HCG BLOOD, ED (MC, WL, AP ONLY): I-stat hCG, quantitative: <5 m[IU]/mL (ref <5)

## 2022-11-13 LAB — HIV ANTIBODY (ROUTINE TESTING W REFLEX): HIV Screen 4th Generation wRfx: NONREACTIVE

## 2022-11-13 LAB — POTASSIUM: Potassium: 4.8 mmol/L (ref 3.5–5.1)

## 2022-11-13 MED ORDER — SPIRONOLACTONE 12.5 MG HALF TABLET
12.5000 mg | ORAL_TABLET | Freq: Every day | ORAL | Status: DC
  Administered 2022-11-14: 12.5 mg via ORAL
  Filled 2022-11-13 (×2): qty 1

## 2022-11-13 MED ORDER — FUROSEMIDE 10 MG/ML IJ SOLN
40.0000 mg | Freq: Once | INTRAMUSCULAR | Status: AC
  Administered 2022-11-13: 40 mg via INTRAVENOUS
  Filled 2022-11-13: qty 4

## 2022-11-13 MED ORDER — ENOXAPARIN SODIUM 40 MG/0.4ML IJ SOSY
40.0000 mg | PREFILLED_SYRINGE | INTRAMUSCULAR | Status: DC
  Administered 2022-11-13: 40 mg via SUBCUTANEOUS
  Filled 2022-11-13: qty 0.4

## 2022-11-13 MED ORDER — BISOPROLOL FUMARATE 5 MG PO TABS
2.5000 mg | ORAL_TABLET | Freq: Every day | ORAL | Status: DC
  Administered 2022-11-13 – 2022-11-14 (×2): 2.5 mg via ORAL
  Filled 2022-11-13 (×2): qty 0.5

## 2022-11-13 MED ORDER — IPRATROPIUM-ALBUTEROL 0.5-2.5 (3) MG/3ML IN SOLN
3.0000 mL | Freq: Once | RESPIRATORY_TRACT | Status: AC
  Administered 2022-11-13: 3 mL via RESPIRATORY_TRACT
  Filled 2022-11-13: qty 3

## 2022-11-13 MED ORDER — SACUBITRIL-VALSARTAN 24-26 MG PO TABS
1.0000 | ORAL_TABLET | Freq: Two times a day (BID) | ORAL | Status: DC
  Administered 2022-11-13 – 2022-11-14 (×2): 1 via ORAL
  Filled 2022-11-13 (×4): qty 1

## 2022-11-13 MED ORDER — FUROSEMIDE 20 MG PO TABS
20.0000 mg | ORAL_TABLET | Freq: Every day | ORAL | Status: DC
  Filled 2022-11-13: qty 1

## 2022-11-13 MED ORDER — PATIROMER SORBITEX CALCIUM 8.4 G PO PACK
8.4000 g | PACK | Freq: Every day | ORAL | Status: DC
  Administered 2022-11-13: 8.4 g via ORAL
  Filled 2022-11-13 (×2): qty 1

## 2022-11-13 MED ORDER — PREDNISONE 20 MG PO TABS
40.0000 mg | ORAL_TABLET | Freq: Every day | ORAL | Status: DC
  Administered 2022-11-13 – 2022-11-14 (×2): 40 mg via ORAL
  Filled 2022-11-13 (×2): qty 2

## 2022-11-13 MED ORDER — FUROSEMIDE 40 MG PO TABS
40.0000 mg | ORAL_TABLET | Freq: Every day | ORAL | Status: DC
  Administered 2022-11-14: 40 mg via ORAL
  Filled 2022-11-13: qty 1

## 2022-11-13 MED ORDER — IPRATROPIUM-ALBUTEROL 0.5-2.5 (3) MG/3ML IN SOLN
3.0000 mL | RESPIRATORY_TRACT | Status: DC | PRN
  Administered 2022-11-13 – 2022-11-14 (×2): 3 mL via RESPIRATORY_TRACT
  Filled 2022-11-13 (×2): qty 3

## 2022-11-13 MED ORDER — ALBUTEROL SULFATE (2.5 MG/3ML) 0.083% IN NEBU: 2.5000 mg | INHALATION_SOLUTION | RESPIRATORY_TRACT | Status: DC | PRN

## 2022-11-13 MED ORDER — NICOTINE 14 MG/24HR TD PT24
14.0000 mg | MEDICATED_PATCH | Freq: Every day | TRANSDERMAL | Status: DC
  Administered 2022-11-14: 14 mg via TRANSDERMAL
  Filled 2022-11-13 (×2): qty 1

## 2022-11-13 MED ORDER — UMECLIDINIUM BROMIDE 62.5 MCG/ACT IN AEPB
1.0000 | INHALATION_SPRAY | Freq: Every day | RESPIRATORY_TRACT | Status: DC
  Administered 2022-11-13 – 2022-11-14 (×2): 1 via RESPIRATORY_TRACT
  Filled 2022-11-13: qty 7

## 2022-11-13 MED ORDER — MOMETASONE FURO-FORMOTEROL FUM 200-5 MCG/ACT IN AERO
2.0000 | INHALATION_SPRAY | Freq: Two times a day (BID) | RESPIRATORY_TRACT | Status: DC
  Administered 2022-11-13 – 2022-11-14 (×3): 2 via RESPIRATORY_TRACT
  Filled 2022-11-13: qty 8.8

## 2022-11-13 MED ORDER — BUPROPION HCL ER (XL) 300 MG PO TB24
300.0000 mg | ORAL_TABLET | Freq: Every morning | ORAL | Status: DC
  Administered 2022-11-13 – 2022-11-14 (×2): 300 mg via ORAL
  Filled 2022-11-13 (×2): qty 1

## 2022-11-13 MED ORDER — ALBUTEROL SULFATE HFA 108 (90 BASE) MCG/ACT IN AERS: 1.0000 | INHALATION_SPRAY | Freq: Four times a day (QID) | RESPIRATORY_TRACT | Status: DC | PRN

## 2022-11-13 MED ORDER — EMPAGLIFLOZIN 10 MG PO TABS
10.0000 mg | ORAL_TABLET | Freq: Every day | ORAL | Status: DC
  Administered 2022-11-13 – 2022-11-14 (×2): 10 mg via ORAL
  Filled 2022-11-13 (×2): qty 1

## 2022-11-13 MED ORDER — DEXAMETHASONE SODIUM PHOSPHATE 10 MG/ML IJ SOLN
10.0000 mg | Freq: Once | INTRAMUSCULAR | Status: AC
  Administered 2022-11-13: 10 mg via INTRAVENOUS
  Filled 2022-11-13: qty 1

## 2022-11-13 MED ORDER — DIPHENHYDRAMINE HCL 25 MG PO CAPS: 25.0000 mg | ORAL_CAPSULE | Freq: Three times a day (TID) | ORAL | Status: DC | PRN

## 2022-11-13 NOTE — H&P (Signed)
History and Physical    EULIA CHAPELL [id number] DOB: 04/19/1976 DOA: @ADMITDT @  PCP: Arnette Felts, FNP   Chief Complaint: LOC  HPI: April Ellis is a 47 y.o. female with medical history significant of Asthma / COPD, HFrEF 35% EF, morbid obesity, OSA has CPAP at home.  Pt in to ED after being found down in bathroom at home by family.  They heard a loud noise and went to check on her. They state they do not know what happened and they deny any ingestions to their knowledge. Upon arrival patient requiring supportive respirations with nonrebreather, after Narcan she became more responsive and denied any alcohol or illicit drug use.  EMS reports she had an epipen next to her in bathroom.  Pt reports she was having an asthma attack this evening, she took a family members epipen due to severity of attack, unfortunately SOB persisted until she ultimately had LOC.   Review of Systems: Denies any sudden onset syncope / drop attack.  Was having cough and SOB prior to todays LOC, felt like major asthma attack (which is why she took epi pen).  She still has SOB and wheezing when she tries to get up to move around.  No orthopnea currently.  No peripheral edema any worse than baseline.   As per HPI otherwise 10 point review of systems negative.   Allergies  Allergen Reactions   Penicillins Other (See Comments)    Childhood Allergy Did it involve swelling of the face/tongue/throat, SOB, or low BP? UNK Did it involve sudden or severe rash/hives, skin peeling, or any reaction on the inside of your mouth or nose? UNK Did you need to seek medical attention at a hospital or doctor's office? UNK When did it last happen?      more than 10 years If all above answers are "NO", may proceed with cephalosporin use.    Pineapple Other (See Comments)    Tested allergic to this    Past Medical History:  Diagnosis Date   Anemia    Asthma    Bronchitis    COPD (chronic obstructive pulmonary  disease) (HCC)    Dyspnea    with asthma flair   Obesity    Respiratory arrest (HCC) 04/04/2020    Past Surgical History:  Procedure Laterality Date   DENTAL SURGERY     2 teeth pulled, 11/22/14   DILITATION & CURRETTAGE/HYSTROSCOPY WITH NOVASURE ABLATION N/A 03/05/2020   Procedure: DILATATION & CURETTAGE/HYSTEROSCOPY WITH NOVASURE ABLATION;  Surgeon: Richarda Overlie, MD;  Location: [facility];  Service: Gynecology;  Laterality: N/A;   NO PAST SURGERIES     RIGHT/LEFT HEART CATH AND CORONARY ANGIOGRAPHY N/A 06/11/2022   Procedure: RIGHT/LEFT HEART CATH AND CORONARY ANGIOGRAPHY;  Surgeon: Orbie Pyo, MD;  Location: [facility];  Service: Cardiovascular;  Laterality: N/A;     reports that she has been smoking cigarettes. She has a 3.75 pack-year smoking history. She has never used smokeless tobacco. She reports current alcohol use. She reports current drug use. Drug: Marijuana.  Family History  Problem Relation Age of Onset   Diabetes Mother    Cancer Mother    Diabetes Sister    Hypertension Sister    Hypertension Sister    Urticaria Neg Hx    Immunodeficiency Neg Hx    Eczema Neg Hx    Asthma Neg Hx    Angioedema Neg Hx    Allergic rhinitis Neg Hx     Prior to Admission  medications   Medication Sig Start Date End Date Taking? Authorizing Provider  albuterol (VENTOLIN HFA) 108 (90 Base) MCG/ACT inhaler Inhale 2 puffs into the lungs every 4 (four) hours as needed for wheezing or shortness of breath. 04/21/22   Arnette Felts, FNP  bisoprolol (ZEBETA) 5 MG tablet Take 0.5 tablets (2.5 mg total) by mouth daily. 07/16/22   Sabharwal, Aditya, DO  buPROPion (WELLBUTRIN XL) 300 MG 24 hr tablet TAKE 1 TABLET BY MOUTH EVERY DAY IN THE MORNING 11/02/22   Arnette Felts, FNP  cetirizine (ZYRTEC) 10 MG tablet Take 10 mg by mouth daily as needed for allergies or rhinitis.    [provider]  empagliflozin (JARDIANCE) 10 MG TABS tablet Take 1 tablet (10 mg total) by mouth daily.  06/16/22   Andrey Farmer, PA-C  EPINEPHrine 0.3 mg/0.3 mL IJ SOAJ injection Inject 0.3 mg into the muscle as needed for anaphylaxis. 07/12/22   Arnette Felts, FNP  Fluticasone-Umeclidin-Vilant (TRELEGY ELLIPTA) 200-62.5-25 MCG/ACT AEPB Inhale 1 puff then rinse mouth, once daily 10/28/22   Jetty Duhamel D, MD  furosemide (LASIX) 20 MG tablet Take 1 tablet (20 mg total) by mouth daily. 06/12/22 11/09/22  Rai, Delene Ruffini, MD  guaiFENesin (MUCINEX) 600 MG 12 hr tablet Take 600 mg by mouth 2 (two) times daily. 10/30/22   [provider]  ipratropium-albuterol (DUONEB) 0.5-2.5 (3) MG/3ML SOLN INHALE 1 VIAL BY MOUTH VIA NEBULIZER EVERY 6 HOURS AS NEEDED 04/21/22   Arnette Felts, FNP  montelukast (SINGULAIR) 10 MG tablet Take 1 tablet (10 mg total) by mouth at bedtime. TAKE 1 TABLET BY MOUTH EVERYDAY AT BEDTIME Strength: 10 mg 04/21/22   Arnette Felts, FNP  nicotine (NICODERM CQ - DOSED IN MG/24 HOURS) 14 mg/24hr patch Place 1 patch (14 mg total) onto the skin daily. 10/28/22 10/28/23  Jetty Duhamel D, MD  nicotine polacrilex (NICORETTE) 4 MG lozenge Take 4 mg by mouth as needed for smoking cessation.    [provider]  predniSONE (DELTASONE) 20 MG tablet Take 20 mg by mouth 2 (two) times daily with a meal. 10/30/22   [provider]  sacubitril-valsartan (ENTRESTO) 24-26 MG Take 1 tablet by mouth 2 (two) times daily. 06/12/22   Rai, Delene Ruffini, MD  Semaglutide-Weight Management (WEGOVY) 0.25 MG/0.5ML SOAJ Inject 0.25 mg into the skin once a week. Patient not taking: Reported on 11/11/2022 11/02/22   Arnette Felts, FNP  spironolactone (ALDACTONE) 25 MG tablet Take 0.5 tablets (12.5 mg total) by mouth daily. 06/12/22   Cathren Harsh, MD    Physical Exam:    11/13/2022    1:30 AM 11/13/2022    1:00 AM 11/13/2022   12:30 AM  Vitals with BMI  Systolic 106 107 161  Diastolic 68 66 65  Pulse 82 88 87    Constitutional: NAD, calm, comfortable Respiratory: Diffuse  wheezing Cardiovascular: Regular rate and rhythm, no murmurs / rubs / gallops. No extremity edema. 2+ pedal pulses. No carotid bruits.  Abdomen: no tenderness, no masses palpated. No hepatosplenomegaly. Bowel sounds positive.  Neurologic: CN 2-12 grossly intact. Sensation intact, DTR normal. Strength 5/5 in all 4.  Psychiatric: Normal judgment and insight. Alert and oriented x 3. Normal mood.     Labs on Admission: I have personally reviewed the patients's labs and imaging studies.  Assessment/Plan    Asthma exacerbation - COPD pathway Prednisone Scheduled LABA/LAMA / INH steroid PRN SABA Adult wheeze protocol Cont pulse ox Syncope - Sounds like pt had syncope due to  respiratory distress in setting of acute asthma exacerbation. Sounds very believable when she denies taking any opiates at home. Not sure why the response to narcan though? Sounds like she had definite prodrome of respiratory distress, wheezing, asthma prior to syncope (which is why she asked family member for the epipen).  Asthma symptoms ongoing now. Arrhythmia thus seems somewhat less likely. Tele monitor while here however HFrEF - EF 35% as of Sept 2d echo Also heart cath at that time.    Hiromi Knodel M. D.O. [facility] If 7PM-7AM, please contact night-coverage [email or web address]  11/13/2022, 4:41 AM

## 2022-11-13 NOTE — Progress Notes (Signed)
Physical Therapy Note  Spoke with occupational therapy after initial evaluation. OT reports patient is functioning at a high level of independence. Physical therapist followed up with patient and reports that she has been mobilizing independently and does not have any further concerns. Declines need for PT this admission. PT is signing-off. Please re-order if there is any significant change in status. Thank you for this referral.  Candie Mile, PT, DPT Physical Therapist Acute Rehabilitation Services [facility]

## 2022-11-13 NOTE — Progress Notes (Signed)
April Ellis is a 47 y.o. female with medical history significant of Asthma / COPD, HFrEF 35% EF, morbid obesity, OSA has CPAP at home.   Pt in to ED after being found down in bathroom at home by family.  They heard a loud noise and went to check on her. They state they do not know what happened and they deny any ingestions to their knowledge. Upon arrival patient requiring supportive respirations with nonrebreather, after Narcan she became more responsive and denied any alcohol or illicit drug use.  EMS reports she had an epipen next to her in bathroom.   Pt reports she was having an asthma attack this evening, she took a family members epipen due to severity of attack, unfortunately SOB persisted until she ultimately had LOC.  She was having bouts of coughing before losing consciousness.  No chest pain or dizziness.  Admitted for asthma exacerbation and syncope. Syncope was thought to be due to respiratory reasons.  She was initially placed on supplemental oxygen and later transitioned to room air.  2/17: Vital stable and patient was on room air when seen today.  Labs pertinent for potassium of 5.5 and rest seems stable.  Chest x-ray and CT head done on admission was without any acute abnormality. EKG.  Personally reviewed.  With sinus tachycardia and no other acute abnormality. Giving an extra dose of Lasix and holding home spironolactone and Entresto today. Also ordered Veltassa and repeat potassium levels for 2 PM. Increasing the home Lasix dose from 20 mg to 40 mg daily from tomorrow.  Patient was seen and examined today.  No new complaints send she appears to be at her baseline and wants to go home but agrees to stay to normalize potassium.  General.  Morbidly obese lady, in no acute distress. Pulmonary.  Lungs clear bilaterally, normal respiratory effort. CV.  Regular rate and rhythm, no JVD, rub or murmur. Abdomen.  Soft, nontender, nondistended, BS positive. CNS.  Alert and  oriented .  No focal neurologic deficit. Extremities.  2+ LE edema, no cyanosis, pulses intact and symmetrical. Psychiatry.  Judgment and insight appears normal.

## 2022-11-13 NOTE — Evaluation (Signed)
Occupational Therapy Evaluation/Discharge  Patient Details Name: April Ellis MRN: [id number] DOB: 1975-12-25 Today's Date: 11/13/2022   History of Present Illness Pt is a 47 y.o. female presenting via EMS after being found lying in her bedroom floor unresponsive. Epi Pen foynd next to her. PMH significant for asthma, COPD, HFrEF 35% EF, morbid obesity, OSA.   Clinical Impression   PTA, pt was independent in ADL, IADL, working, and driving. Upon eval, pt continues to be independent and feels she is at her functional baseline. Offered to review energy conservation techniques and discuss building activity tolerance but per pt, she feels normal and was not observed to fatigue during session. Pt with good basic health literacy and follows up with her doctors frequently. Pt additionally with good insight as to when to take medications or call for help in emergency situations. Recommending discharge home with no OT follow up. Pt with intentions to follow up with cardiologist/pulmonologist, cardiologist for medical management.      Recommendations for follow up therapy are one component of a multi-disciplinary discharge planning process, led by the attending physician.  Recommendations may be updated based on patient status, additional functional criteria and insurance authorization.   Follow Up Recommendations  No OT follow up     Assistance Recommended at Discharge PRN  Patient can return home with the following  (as needed)    Functional Status Assessment  Patient has had a recent decline in their functional status and demonstrates the ability to make significant improvements in function in a reasonable and predictable amount of time.  Equipment Recommendations  None recommended by OT    Recommendations for Other Services       Precautions / Restrictions        Mobility Bed Mobility Overal bed mobility: Independent                  Transfers Overall transfer level:  Independent Equipment used: None                      Balance                                           ADL either performed or assessed with clinical judgement   ADL Overall ADL's : Independent                                       General ADL Comments: Pt able to don socks, perform simulated tub/shower transfer, and perform functional mobility during session independently.     Vision Ability to See in Adequate Light: 0 Adequate Patient Visual Report: No change from baseline Vision Assessment?: No apparent visual deficits     Perception Perception Perception Tested?: No   Praxis Praxis Praxis tested?: Within functional limits    Pertinent Vitals/Pain Pain Assessment Pain Assessment: No/denies pain     Hand Dominance Right   Extremity/Trunk Assessment Upper Extremity Assessment Upper Extremity Assessment: Overall WFL for tasks assessed   Lower Extremity Assessment Lower Extremity Assessment: Overall WFL for tasks assessed       Communication Communication Communication: No difficulties   Cognition Arousal/Alertness: Awake/alert Behavior During Therapy: WFL for tasks assessed/performed Overall Cognitive Status: Within Functional Limits for tasks assessed  General Comments  VSS    Exercises     Shoulder Instructions      Home Living Family/patient expects to be discharged to:: Private residence Living Arrangements: Children;Other relatives (sister) Available Help at Discharge: Family Type of Home: House Home Access: Stairs to enter [employer] of Steps: 2-3 onto porch Entrance Stairs-Rails: None (reports something is there she could hang onto if needed) Home Layout: One level     Bathroom Shower/Tub: Chief Strategy Officer: Standard     Home Equipment: None          Prior Functioning/Environment Prior Level of Function :  Independent/Modified Independent;Working/employed;Driving             Mobility Comments: no AD ADLs Comments: works from home, 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 in sociology        OT Problem List: Cardiopulmonary status limiting activity;Decreased strength;Decreased activity tolerance      OT Treatment/Interventions:      OT Goals(Current goals can be found in the care plan section) Acute Rehab OT Goals Patient Stated Goal: go home OT Goal Formulation: With patient Time For Goal Achievement: 11/27/22 Potential to Achieve Goals: Good  OT Frequency:      Co-evaluation              AM-PAC OT "6 Clicks" Daily Activity     Outcome Measure Help from another person eating meals?: None Help from another person taking care of personal grooming?: None Help from another person toileting, which includes using toliet, bedpan, or urinal?: None Help from another person bathing (including washing, rinsing, drying)?: None Help from another person to put on and taking off regular upper body clothing?: None Help from another person to put on and taking off regular lower body clothing?: None 6 Click Score: 24   End of Session Nurse Communication: Mobility status  Activity Tolerance: Patient tolerated treatment well Patient left: in bed;with call bell/phone within reach  OT Visit Diagnosis: Unsteadiness on feet (R26.81);Muscle weakness (generalized) (M62.81)                Time: 4098-1191 OT Time Calculation (min): 22 min Charges:  OT General Charges $OT Visit: 1 Visit OT Evaluation $OT Eval Low Complexity: 1 Low Tyler Deis, OTR/L [facility] Acute Rehabilitation Office: [phone]   Myrla Halsted 11/13/2022, 9:42 AM

## 2022-11-13 NOTE — ED Notes (Signed)
ED TO INPATIENT HANDOFF REPORT  ED Nurse Name and Phone #: Lysle Rubens RN [phone]  S Name/Age/Gender April Ellis 47 y.o. female Room/Bed: 002C/002C  Code Status   Code Status: Full Code  Home/SNF/Other Home Patient oriented to: self, place, time, and situation Is this baseline? Yes   Triage Complete: Triage complete  Chief Complaint Asthma [J45.909]  Triage Note BIB EMS found laying in floor in bedroom, unresponsive. Family un able to provide any information. EPI pen found by patient on scene.   Last VS146/94, 114, 135 CBG   Allergies No Known Allergies  Level of Care/Admitting Diagnosis ED Disposition     ED Disposition  Admit   Condition  --   Comment  Hospital Area: [facility] [100100]  Level of Care: Telemetry Medical [104]  May place patient in observation at [facility] or [facility] if equivalent level of care is available:: No  Covid Evaluation: Asymptomatic - no recent exposure (last 10 days) testing not required  Diagnosis: Asthma [id number]  Admitting Physician: Etta Quill [id number]  Attending Physician: Etta Quill [[id number]]          B Medical/Surgery History No past medical history on file.    A IV Location/Drains/Wounds Patient Lines/Drains/Airways Status     Active Line/Drains/Airways     Name Placement date Placement time Site Days   Peripheral IV 11/12/22 20 G Anterior;Left Hand 11/12/22  2235  Hand  1   Peripheral IV 11/12/22 18 G Anterior;Proximal;Right Forearm 11/12/22  2240  Forearm  1            Intake/Output Last 24 hours No intake or output data in the 24 hours ending 11/13/22 0524  Labs/Imaging Results for orders placed or performed during the hospital encounter of 11/12/22 (from the past 48 hour(s))  Comprehensive metabolic panel     Status: Abnormal   Collection Time: 11/12/22 10:24 PM  Result Value Ref Range   Sodium 134 (L) 135 - 145 mmol/L   Potassium 4.3 3.5 - 5.1 mmol/L   Chloride  100 98 - 111 mmol/L   CO2 24 22 - 32 mmol/L   Glucose, Bld 219 (H) 70 - 99 mg/dL    Comment: Glucose reference range applies only to samples taken after fasting for at least 8 hours.   BUN 12 6 - 20 mg/dL    Comment: QA FLAGS AND/OR RANGES MODIFIED BY DEMOGRAPHIC UPDATE ON 02/16 AT 2349   Creatinine, Ser 1.09 (H) 0.44 - 1.00 mg/dL   Calcium 8.3 (L) 8.9 - 10.3 mg/dL   Total Protein 6.7 6.5 - 8.1 g/dL   Albumin 3.4 (L) 3.5 - 5.0 g/dL   AST 25 15 - 41 U/L   ALT 17 0 - 44 U/L   Alkaline Phosphatase 80 38 - 126 U/L   Total Bilirubin 0.6 0.3 - 1.2 mg/dL   GFR, Estimated 34 (L) >60 mL/min    Comment: (NOTE) Calculated using the CKD-EPI Creatinine Equation (2021)    Anion gap 10 5 - 15    Comment: Performed at [facility], [place] [address] [place], [place] [address]  Ethanol     Status: None   Collection Time: 11/12/22 10:24 PM  Result Value Ref Range   Alcohol, Ethyl (B) <10 <10 mg/dL    Comment: (NOTE) Lowest detectable limit for serum alcohol is 10 mg/dL.  For medical purposes only. Performed at [facility], [place] [address], [place], [place] [address]  CBC with Differential     Status: Abnormal   Collection Time: 11/12/22 10:24 PM  Result Value Ref Range   WBC 19.6 (H) 4.0 - 10.5 K/uL   RBC 5.08 3.87 - 5.11 MIL/uL   Hemoglobin 15.2 (H) 12.0 - 15.0 g/dL   HCT 49.8 (H) 36.0 - 46.0 %   MCV 98.0 80.0 - 100.0 fL   MCH 29.9 26.0 - 34.0 pg   MCHC 30.5 30.0 - 36.0 g/dL   RDW 12.8 11.5 - 15.5 %   Platelets 227 150 - 400 K/uL   nRBC 0.0 0.0 - 0.2 %   Neutrophils Relative % 50 %   Neutro Abs 9.8 (H) 1.7 - 7.7 K/uL   Lymphocytes Relative 38 %   Lymphs Abs 7.4 (H) 0.7 - 4.0 K/uL   Monocytes Relative 8 %   Monocytes Absolute 1.6 (H) 0.1 - 1.0 K/uL   Eosinophils Relative 0 %   Eosinophils Absolute 0.0 0.0 - 0.5 K/uL   Basophils Relative 4 %   Basophils Absolute 0.8 (H) 0.0 - 0.1 K/uL   RBC Morphology MORPHOLOGY UNREMARKABLE    Abs Immature Granulocytes 0.00 0.00 - 0.07  K/uL    Comment: Performed at [facility], [address] [place], [place] [address]  Protime-INR     Status: None   Collection Time: 11/12/22 10:24 PM  Result Value Ref Range   Prothrombin Time 12.6 11.4 - 15.2 seconds   INR 1.0 0.8 - 1.2    Comment: (NOTE) INR goal varies based on device and disease states. Performed at [facility], [place] [address], [place], [place] [address]   Acetaminophen level     Status: Abnormal   Collection Time: 11/12/22 10:24 PM  Result Value Ref Range   Acetaminophen (Tylenol), Serum <10 (L) 10 - 30 ug/mL    Comment: (NOTE) Therapeutic concentrations vary significantly. A range of 10-30 ug/mL  may be an effective concentration for many patients. However, some  are best treated at concentrations outside of this range. Acetaminophen concentrations >150 ug/mL at 4 hours after ingestion  and >50 ug/mL at 12 hours after ingestion are often associated with  toxic reactions.  Performed at [facility], [place] [address], [place], [place] [id number]   Salicylate level     Status: Abnormal   Collection Time: 11/12/22 10:24 PM  Result Value Ref Range   Salicylate Lvl [id number] (L) 7.0 - 30.0 mg/dL    Comment: Performed at [place] [address] [place], [place] [address]  I-Stat venous blood gas, ED     Status: Abnormal   Collection Time: 11/12/22 10:34 PM  Result Value Ref Range   pH, Ven 7.164 (LL) 7.25 - 7.43   pCO2, Ven 73.3 (HH) 44 - 60 mmHg   pO2, Ven 139 (H) 32 - 45 mmHg   Bicarbonate 26.4 20.0 - 28.0 mmol/L   TCO2 29 22 - 32 mmol/L   O2 Saturation 98 %   Acid-base deficit 4.0 (H) 0.0 - 2.0 mmol/L   Sodium 136 135 - 145 mmol/L   Potassium 4.3 3.5 - 5.1 mmol/L   Calcium, Ion 1.17 1.15 - 1.40 mmol/L   HCT 49.0 (H) 36.0 - 46.0 %   Hemoglobin 16.7 (H) 12.0 - 15.0 g/dL   Sample type VENOUS    Comment NOTIFIED PHYSICIAN   Resp panel by RT-PCR (RSV, Flu A&B, Covid) Anterior Nasal Swab     Status: None   Collection Time:  11/12/22 10:40 PM   Specimen: Anterior Nasal Swab  Result Value Ref Range   SARS Coronavirus 2 by RT PCR NEGATIVE NEGATIVE   Influenza A by PCR NEGATIVE NEGATIVE   Influenza B by PCR NEGATIVE NEGATIVE    Comment: (NOTE) The Xpert Xpress SARS-CoV-2/FLU/RSV plus assay is intended as an aid in the diagnosis of influenza from Nasopharyngeal swab specimens and should not be used as a sole basis for treatment. Nasal washings and aspirates are unacceptable for Xpert Xpress SARS-CoV-2/FLU/RSV testing.  Fact Sheet for Patients: EntrepreneurPulse.com.au  Fact Sheet for Healthcare Providers: IncredibleEmployment.be  This test is not yet approved or cleared by the [place] FDA and has been authorized for detection and/or diagnosis of SARS-CoV-2 by FDA under an Emergency Use Authorization (EUA). This EUA will remain in effect (meaning this test can be used) for the duration of the COVID-19 declaration under Section 564(b)(1) of the Act, 21 U.S.C. section 360bbb-3(b)(1), unless the authorization is terminated or revoked.     Resp Syncytial Virus by PCR NEGATIVE NEGATIVE    Comment: (NOTE) Fact Sheet for Patients: EntrepreneurPulse.com.au  Fact Sheet for Healthcare Providers: IncredibleEmployment.be  This test is not yet approved or cleared by the [place] FDA and has been authorized for detection and/or diagnosis of SARS-CoV-2 by FDA under an Emergency Use Authorization (EUA). This EUA will remain in effect (meaning this test can be used) for the duration of the COVID-19 declaration under Section 564(b)(1) of the Act, 21 U.S.C. section 360bbb-3(b)(1), unless the authorization is terminated or revoked.  Performed at [facility], [place] [address], [place], [place] [address]   I-Stat beta hCG blood, ED ([facility], [facility], [facility] only)     Status: None   Collection Time: 11/13/22 12:14 AM  Result Value Ref Range    I-stat hCG, quantitative <5.0 <5 mIU/mL   Comment 3            Comment:   GEST. AGE      CONC.  (mIU/mL)   <=1 WEEK        5 - 50     2 WEEKS       50 - 500     3 WEEKS       100 - 10,000     4 WEEKS     1,000 - 30,000        FEMALE AND NON-PREGNANT FEMALE:     LESS THAN 5 mIU/mL   I-Stat venous blood gas, ([facility] ED, [facility], [facility])     Status: Abnormal   Collection Time: 11/13/22  1:09 AM  Result Value Ref Range   pH, Ven 7.297 7.25 - 7.43   pCO2, Ven 64.9 (H) 44 - 60 mmHg   pO2, Ven 50 (H) 32 - 45 mmHg   Bicarbonate 31.7 (H) 20.0 - 28.0 mmol/L   TCO2 34 (H) 22 - 32 mmol/L   O2 Saturation 80 %   Acid-Base Excess 3.0 (H) 0.0 - 2.0 mmol/L   Sodium 136 135 - 145 mmol/L   Potassium 5.2 (H) 3.5 - 5.1 mmol/L   Calcium, Ion 1.13 (L) 1.15 - 1.40 mmol/L   HCT 49.0 (H) 36.0 - 46.0 %   Hemoglobin 16.7 (H) 12.0 - 15.0 g/dL   Sample type VENOUS    DG Chest Portable 1 View  Result Date: 11/12/2022 CLINICAL DATA:  Shortness of breath EXAM: PORTABLE CHEST 1 VIEW COMPARISON:  None Available. FINDINGS: Borderline cardiac enlargement. No acute airspace disease. No pleural effusion or  pneumothorax. IMPRESSION: No active disease. Borderline cardiomegaly. Electronically Signed   By: Donavan Foil M.D.   On: 11/12/2022 23:40   CT Head Wo Contrast  Result Date: 11/12/2022 CLINICAL DATA:  Found down EXAM: CT HEAD WITHOUT CONTRAST TECHNIQUE: Contiguous axial images were obtained from the base of the skull through the vertex without intravenous contrast. RADIATION DOSE REDUCTION: This exam was performed according to the departmental dose-optimization program which includes automated exposure control, adjustment of the mA and/or kV according to patient size and/or use of iterative reconstruction technique. COMPARISON:  None Available. FINDINGS: Brain: No evidence of acute infarction, hemorrhage, mass, mass effect, or midline shift. No hydrocephalus or extra-axial fluid collection. Gray-white differentiation is  preserved. The basilar cisterns are patent. Vascular: No hyperdense vessel. Skull: Negative for fracture or focal lesion. Sinuses/Orbits: Mucosal thickening in the ethmoid air cells. No acute finding in the orbits. Other: The mastoid air cells are well aerated. IMPRESSION: No acute intracranial process. Electronically Signed   By: Merilyn Baba M.D.   On: 11/12/2022 23:19    Pending Labs Unresulted Labs (From admission, onward)     Start     Ordered   11/13/22 0500  CBC  Tomorrow morning,   R        11/13/22 0428   11/13/22 [id number]  Basic metabolic panel  Tomorrow morning,   R        11/13/22 0428   11/13/22 0427  HIV Antibody (routine testing w rflx)  (HIV Antibody (Routine testing w reflex) panel)  Once,   R        11/13/22 0428   11/12/22 2224  Pathologist smear review  Once,   R        11/12/22 2224   11/12/22 2222  Urine rapid drug screen (hosp performed)  Once,   STAT        11/12/22 2221            Vitals/Pain Today's Vitals   11/13/22 0030 11/13/22 0100 11/13/22 0130 11/13/22 0330  BP: 107/65 107/66 106/68   Pulse: 87 88 82   Resp: 17 (!) 0 20   Temp:      TempSrc:      SpO2: 98% 93% 95%   Weight:      Height:      PainSc:    0-No pain    Isolation Precautions Airborne and Contact precautions  Medications Medications  enoxaparin (LOVENOX) injection 40 mg (has no administration in time range)  predniSONE (DELTASONE) tablet 40 mg (has no administration in time range)  mometasone-formoterol (DULERA) 200-5 MCG/ACT inhaler 2 puff (has no administration in time range)  albuterol (PROVENTIL) (2.5 MG/3ML) 0.083% nebulizer solution 2.5 mg (has no administration in time range)  umeclidinium bromide (INCRUSE ELLIPTA) 62.5 MCG/ACT 1 puff (has no administration in time range)  sacubitril-valsartan (ENTRESTO) 24-26 mg per tablet (has no administration in time range)  furosemide (LASIX) tablet 20 mg (has no administration in time range)  empagliflozin (JARDIANCE) tablet 10 mg  (has no administration in time range)  spironolactone (ALDACTONE) tablet 12.5 mg (has no administration in time range)  ipratropium-albuterol (DUONEB) 0.5-2.5 (3) MG/3ML nebulizer solution 3 mL (has no administration in time range)  nicotine (NICODERM CQ - dosed in mg/24 hours) patch 14 mg (has no administration in time range)  bisoprolol (ZEBETA) tablet 2.5 mg (has no administration in time range)  buPROPion (WELLBUTRIN XL) 24 hr tablet 300 mg (has no administration in time range)  diphenhydrAMINE (BENADRYL) capsule 25  mg (has no administration in time range)  naloxone [facility]) 2 MG/2ML injection (2 mg  Given 11/12/22 2239)  ipratropium-albuterol (DUONEB) 0.5-2.5 (3) MG/3ML nebulizer solution 3 mL (3 mLs Nebulization Given 11/13/22 0006)  dexamethasone (DECADRON) injection 10 mg (10 mg Intravenous Given 11/13/22 0006)    Mobility non-ambulatory     Focused Assessments Cardiac Assessment Handoff:  Cardiac Rhythm: Normal sinus rhythm No results found for: "CKTOTAL", "CKMB", "CKMBINDEX", "TROPONINI" No results found for: "DDIMER" Does the Patient currently have chest pain? No   , Neuro Assessment Handoff:  Swallow screen pass?  Cardiac Rhythm: Normal sinus rhythm       Neuro Assessment: Exceptions to [facility] Neuro Checks:      Has TPA been given?  If patient is a Neuro Trauma and patient is going to OR before floor call report to [place] nurse: [phone] or [phone]  , Renal Assessment Handoff:  Hemodialysis Schedule:  Last Hemodialysis date and time:    Restricted appendage:    , Pulmonary Assessment Handoff:  Lung sounds:          R Recommendations: See Admitting Provider Note  Report given to:   Additional Notes:

## 2022-11-13 NOTE — ED Notes (Signed)
Pt resting in bed with no acute distress at this time.

## 2022-11-13 NOTE — Hospital Course (Signed)
Taken from H&P.  April Ellis is a 47 y.o. female with medical history significant of Asthma / COPD, HFrEF 35% EF, morbid obesity, OSA has CPAP at home.   Pt in to ED after being found down in bathroom at home by family.  They heard a loud noise and went to check on her. They state they do not know what happened and they deny any ingestions to their knowledge. Upon arrival patient requiring supportive respirations with nonrebreather, after Narcan she became more responsive and denied any alcohol or illicit drug use.  EMS reports she had an epipen next to her in bathroom.   Pt reports she was having an asthma attack this evening, she took a family members epipen due to severity of attack, unfortunately SOB persisted until she ultimately had LOC.  She was having bouts of coughing before losing consciousness.  No chest pain or dizziness.  Admitted for asthma exacerbation and syncope. Syncope was thought to be due to respiratory reasons.  She was initially placed on supplemental oxygen and later transitioned to room air.  2/17: Vital stable and patient was on room air when seen today.  Labs pertinent for potassium of 5.5 and rest seems stable.  Chest x-ray and CT head done on admission was without any acute abnormality. EKG.  Personally reviewed.  With sinus tachycardia and no other acute abnormality. Giving an extra dose of Lasix and holding home spironolactone and Entresto today. Also ordered Veltassa and repeat potassium levels for 2 PM.  2/18: Hemodynamically stable.  Remained on room air.  Hyperkalemia resolved with Veltassa. She is being discharged on 3 more days of prednisone.  She was advised to follow-up with PCP and they can refer her to see an allergy specialist for elevated IgE and concern of some allergies causing this type of episodes.  Her home dose of Lasix was increased to 40 mg daily instead of 20 mg.  Patient will continue with rest of her home medications and need to have a  close follow-up with her providers for further recommendations.

## 2022-11-13 NOTE — Telephone Encounter (Signed)
Encounter generated due to pt having 2nd chart, in ED currently.  Please ignore this encounter.

## 2022-11-14 ENCOUNTER — Encounter (HOSPITAL_COMMUNITY): Payer: Self-pay | Admitting: Internal Medicine

## 2022-11-14 ENCOUNTER — Encounter: Payer: Self-pay | Admitting: Nurse Practitioner

## 2022-11-14 DIAGNOSIS — R55 Syncope and collapse: Secondary | ICD-10-CM | POA: Diagnosis not present

## 2022-11-14 DIAGNOSIS — J4551 Severe persistent asthma with (acute) exacerbation: Secondary | ICD-10-CM | POA: Diagnosis not present

## 2022-11-14 LAB — GLUCOSE, CAPILLARY
Glucose-Capillary: 126 mg/dL — ABNORMAL HIGH (ref 70–99)
Glucose-Capillary: 122 mg/dL — ABNORMAL HIGH (ref 70–99)

## 2022-11-14 LAB — BASIC METABOLIC PANEL
Anion gap: 8 (ref 5–15)
BUN: 13 mg/dL (ref 6–20)
CO2: 29 mmol/L (ref 22–32)
Calcium: 8.8 mg/dL — ABNORMAL LOW (ref 8.9–10.3)
Chloride: 100 mmol/L (ref 98–111)
Creatinine, Ser: 1.06 mg/dL — ABNORMAL HIGH (ref 0.44–1.00)
GFR, Estimated: >60 mL/min (ref >60)
Glucose, Bld: 128 mg/dL — ABNORMAL HIGH (ref 70–99)
Potassium: 4 mmol/L (ref 3.5–5.1)
Sodium: 137 mmol/L (ref 135–145)

## 2022-11-14 MED ORDER — PREDNISONE 20 MG PO TABS: 40.0000 mg | ORAL_TABLET | Freq: Every day | ORAL | 0 refills | Status: AC

## 2022-11-14 MED ORDER — FUROSEMIDE 20 MG PO TABS: 40.0000 mg | ORAL_TABLET | Freq: Every day | ORAL | 1 refills | Status: DC

## 2022-11-14 MED ORDER — GUAIFENESIN-DM 100-10 MG/5ML PO SYRP
5.0000 mL | ORAL_SOLUTION | ORAL | Status: DC | PRN
  Administered 2022-11-14: 5 mL via ORAL
  Filled 2022-11-14: qty 5

## 2022-11-14 MED ORDER — ALBUTEROL SULFATE (2.5 MG/3ML) 0.083% IN NEBU
2.5000 mg | INHALATION_SOLUTION | Freq: Three times a day (TID) | RESPIRATORY_TRACT | Status: DC
  Administered 2022-11-14: 2.5 mg via RESPIRATORY_TRACT
  Filled 2022-11-14: qty 3

## 2022-11-14 MED ORDER — GUAIFENESIN-DM 100-10 MG/5ML PO SYRP: 5.0000 mL | ORAL_SOLUTION | ORAL | 0 refills | Status: DC | PRN

## 2022-11-14 NOTE — Plan of Care (Signed)

## 2022-11-14 NOTE — Discharge Summary (Signed)
Physician Discharge Summary   Patient: April Ellis MRN: [id number] DOB: 1975/12/26  Admit date:     11/12/2022  Discharge date: 11/14/22  Discharge Physician: Arnetha Courser   PCP: Pcp, No   Recommendations at discharge:  Please obtain CBC and BMP during next follow-up appointment. Follow-up with primary care provider within a week Recommending follow-up with allergy specialist  Discharge Diagnoses: Principal Problem:   Asthma Active Problems:   Chronic HFrEF (heart failure with reduced ejection fraction) (HCC)   Syncope   Hospital Course: Taken from H&P.  ANTHA Ellis is a 47 y.o. female with medical history significant of Asthma / COPD, HFrEF 35% EF, morbid obesity, OSA has CPAP at home.   Pt in to ED after being found down in bathroom at home by family.  They heard a loud noise and went to check on her. They state they do not know what happened and they deny any ingestions to their knowledge. Upon arrival patient requiring supportive respirations with nonrebreather, after Narcan she became more responsive and denied any alcohol or illicit drug use.  EMS reports she had an epipen next to her in bathroom.   Pt reports she was having an asthma attack this evening, she took a family members epipen due to severity of attack, unfortunately SOB persisted until she ultimately had LOC.  She was having bouts of coughing before losing consciousness.  No chest pain or dizziness.  Admitted for asthma exacerbation and syncope. Syncope was thought to be due to respiratory reasons.  She was initially placed on supplemental oxygen and later transitioned to room air.  2/17: Vital stable and patient was on room air when seen today.  Labs pertinent for potassium of 5.5 and rest seems stable.  Chest x-ray and CT head done on admission was without any acute abnormality. EKG.  Personally reviewed.  With sinus tachycardia and no other acute abnormality. Giving an extra dose of Lasix and holding  home spironolactone and Entresto today. Also ordered Veltassa and repeat potassium levels for 2 PM.  2/18: Hemodynamically stable.  Remained on room air.  Hyperkalemia resolved with Veltassa. She is being discharged on 3 more days of prednisone.  She was advised to follow-up with PCP and they can refer her to see an allergy specialist for elevated IgE and concern of some allergies causing this type of episodes.  Her home dose of Lasix was increased to 40 mg daily instead of 20 mg.  Patient will continue with rest of her home medications and need to have a close follow-up with her providers for further recommendations.    Consultants: None Procedures performed: None Disposition: Home Diet recommendation:  Discharge Diet Orders (From admission, onward)     Start     Ordered   11/14/22 0000  Diet - low sodium heart healthy        11/14/22 1054           Cardiac and Carb modified diet DISCHARGE MEDICATION: Allergies as of 11/14/2022   No Known Allergies      Medication List     TAKE these medications    albuterol 108 (90 Base) MCG/ACT inhaler Commonly known as: VENTOLIN HFA Inhale 1 puff into the lungs every 6 (six) hours as needed for wheezing or shortness of breath.   bisoprolol 5 MG tablet Commonly known as: ZEBETA Take 2.5 mg by mouth daily.   buPROPion 150 MG 24 hr tablet Commonly known as: WELLBUTRIN XL Take 300 mg by mouth  every morning.   diphenhydrAMINE 25 mg capsule Commonly known as: BENADRYL Take 25 mg by mouth every 8 (eight) hours as needed for allergies.   Entresto 24-26 MG Generic drug: sacubitril-valsartan Take 1 tablet by mouth 2 (two) times daily.   furosemide 20 MG tablet Commonly known as: LASIX Take 2 tablets (40 mg total) by mouth daily. What changed: how much to take   guaiFENesin-dextromethorphan 100-10 MG/5ML syrup Commonly known as: ROBITUSSIN DM Take 5 mLs by mouth every 4 (four) hours as needed for cough.    ipratropium-albuterol 0.5-2.5 (3) MG/3ML Soln Commonly known as: DUONEB Take 3 mLs by nebulization every 4 (four) hours as needed (for wheezing and shortness of breath).   Jardiance 10 MG Tabs tablet Generic drug: empagliflozin Take 10 mg by mouth daily.   nicotine 14 mg/24hr patch Commonly known as: NICODERM CQ - dosed in mg/24 hours Place 14 mg onto the skin daily.   predniSONE 20 MG tablet Commonly known as: DELTASONE Take 2 tablets (40 mg total) by mouth daily with breakfast for 3 days. Start taking on: November 15, 2022   spironolactone 25 MG tablet Commonly known as: ALDACTONE Take 12.5 mg by mouth daily.   Trelegy Ellipta 200-62.5-25 MCG/ACT Aepb Generic drug: Fluticasone-Umeclidin-Vilant Inhale 1 puff into the lungs daily.        Discharge Exam: Filed Weights   11/12/22 2235 11/13/22 4010  Weight: (!) 145.2 kg (!) 151 kg   General.  Morbidly obese lady, in no acute distress. Pulmonary.  Lungs clear bilaterally, normal respiratory effort. CV.  Regular rate and rhythm, no JVD, rub or murmur. Abdomen.  Soft, nontender, nondistended, BS positive. CNS.  Alert and oriented .  No focal neurologic deficit. Extremities.  Trace LE edema, no cyanosis, pulses intact and symmetrical. Psychiatry.  Judgment and insight appears normal.   Condition at discharge: stable  The results of significant diagnostics from this hospitalization (including imaging, microbiology, ancillary and laboratory) are listed below for reference.   Imaging Studies: DG Chest Portable 1 View  Result Date: 11/12/2022 CLINICAL DATA:  Shortness of breath EXAM: PORTABLE CHEST 1 VIEW COMPARISON:  None Available. FINDINGS: Borderline cardiac enlargement. No acute airspace disease. No pleural effusion or pneumothorax. IMPRESSION: No active disease. Borderline cardiomegaly. Electronically Signed   By: Jasmine Pang M.D.   On: 11/12/2022 23:40   CT Head Wo Contrast  Result Date: 11/12/2022 CLINICAL DATA:   Found down EXAM: CT HEAD WITHOUT CONTRAST TECHNIQUE: Contiguous axial images were obtained from the base of the skull through the vertex without intravenous contrast. RADIATION DOSE REDUCTION: This exam was performed according to the departmental dose-optimization program which includes automated exposure control, adjustment of the mA and/or kV according to patient size and/or use of iterative reconstruction technique. COMPARISON:  None Available. FINDINGS: Brain: No evidence of acute infarction, hemorrhage, mass, mass effect, or midline shift. No hydrocephalus or extra-axial fluid collection. Gray-white differentiation is preserved. The basilar cisterns are patent. Vascular: No hyperdense vessel. Skull: Negative for fracture or focal lesion. Sinuses/Orbits: Mucosal thickening in the ethmoid air cells. No acute finding in the orbits. Other: The mastoid air cells are well aerated. IMPRESSION: No acute intracranial process. Electronically Signed   By: Wiliam Ke M.D.   On: 11/12/2022 23:19    Microbiology: Results for orders placed or performed during the hospital encounter of 11/12/22  Resp panel by RT-PCR (RSV, Flu A&B, Covid) Anterior Nasal Swab     Status: None   Collection Time: 11/12/22 10:40 PM  Specimen: Anterior Nasal Swab  Result Value Ref Range Status   SARS Coronavirus 2 by RT PCR NEGATIVE NEGATIVE Final   Influenza A by PCR NEGATIVE NEGATIVE Final   Influenza B by PCR NEGATIVE NEGATIVE Final    Comment: (NOTE) The Xpert Xpress SARS-CoV-2/FLU/RSV plus assay is intended as an aid in the diagnosis of influenza from Nasopharyngeal swab specimens and should not be used as a sole basis for treatment. Nasal washings and aspirates are unacceptable for Xpert Xpress SARS-CoV-2/FLU/RSV testing.  Fact Sheet for Patients: BloggerCourse.com  Fact Sheet for Healthcare Providers: SeriousBroker.it  This test is not yet approved or cleared by the  [place] FDA and has been authorized for detection and/or diagnosis of SARS-CoV-2 by FDA under an Emergency Use Authorization (EUA). This EUA will remain in effect (meaning this test can be used) for the duration of the COVID-19 declaration under Section 564(b)(1) of the Act, 21 U.S.C. section 360bbb-3(b)(1), unless the authorization is terminated or revoked.     Resp Syncytial Virus by PCR NEGATIVE NEGATIVE Final    Comment: (NOTE) Fact Sheet for Patients: BloggerCourse.com  Fact Sheet for Healthcare Providers: SeriousBroker.it  This test is not yet approved or cleared by the [place] FDA and has been authorized for detection and/or diagnosis of SARS-CoV-2 by FDA under an Emergency Use Authorization (EUA). This EUA will remain in effect (meaning this test can be used) for the duration of the COVID-19 declaration under Section 564(b)(1) of the Act, 21 U.S.C. section 360bbb-3(b)(1), unless the authorization is terminated or revoked.  Performed at [facility], [address], [place], [place] [address]     Labs: CBC: Recent Labs  Lab 11/12/22 2224 11/12/22 2234 11/13/22 0109 11/13/22 0524  WBC 19.6*  --   --  12.6*  NEUTROABS 9.8*  --   --   --   HGB 15.2* 16.7* 16.7* 15.6*  HCT 49.8* 49.0* 49.0* 49.2*  MCV 98.0  --   --  95.9  PLT 227  --   --  197   Basic Metabolic Panel: Recent Labs  Lab 11/12/22 2224 11/12/22 2234 11/13/22 0109 11/13/22 0524 11/13/22 1429 11/14/22 0237  NA 134* 136 136 134*  --  137  K 4.3 4.3 5.2* 5.5* 4.8 4.0  CL 100  --   --  100  --  100  CO2 24  --   --  26  --  29  GLUCOSE 219*  --   --  142*  --  128*  BUN 12  --   --  13  --  13  CREATININE 1.09*  --   --  1.03*  --  1.06*  CALCIUM 8.3*  --   --  8.7*  --  8.8*   Liver Function Tests: Recent Labs  Lab 11/12/22 2224  AST 25  ALT 17  ALKPHOS 80  BILITOT 0.6  PROT 6.7  ALBUMIN 3.4*   CBG: Recent Labs   Lab 11/13/22 0716 11/13/22 1140 11/13/22 1540 11/14/22 0744  GLUCAP 137* 113* 137* 126*    Discharge time spent: greater than 30 minutes.  This record has been created using Conservation officer, historic buildings. Errors have been sought and corrected,but may not always be located. Such creation errors do not reflect on the standard of care.   Signed: Arnetha Courser, MD [facility] 11/14/2022

## 2022-11-14 NOTE — TOC Transition Note (Signed)
Transition of Care [facility]) - CM/SW Discharge Note   Patient Details  Name: April Ellis MRN: [id number] Date of Birth: 07-07-1976  Transition of Care [facility]) CM/SW Contact:  Zenon Mayo, RN Phone Number: 11/14/2022, 12:11 PM   Clinical Narrative:    Patient is for dc today, has no needs.          Patient Goals and CMS Choice      Discharge Placement                         Discharge Plan and Services Additional resources added to the After Visit Summary for                                       Social Determinants of Health (SDOH) Interventions SDOH Screenings   Food Insecurity: No Food Insecurity (11/13/2022)  Housing: Low Risk  (11/13/2022)  Transportation Needs: No Transportation Needs (11/13/2022)  Utilities: Not At Risk (11/13/2022)     Readmission Risk Interventions     No data to display

## 2022-11-15 ENCOUNTER — Encounter: Payer: Self-pay | Admitting: Nurse Practitioner

## 2022-11-16 ENCOUNTER — Telehealth: Payer: Self-pay

## 2022-11-16 LAB — PATHOLOGIST SMEAR REVIEW

## 2022-11-16 NOTE — Transitions of Care (Post Inpatient/ED Visit) (Signed)
   11/16/2022  Name: KHADJAH GLADNEY MRN: [id number] DOB: 09-29-1975  Today's TOC FU Call Status: Today's TOC FU Call Status:: Successful TOC FU Call Competed TOC FU Call Complete Date: 11/16/22  Transition Care Management Follow-up Telephone Call Date of Discharge: 11/14/22 Discharge Facility: [facility]) Type of Discharge: Emergency Department Reason for ED Visit: Respiratory Respiratory Diagnosis: Asthma (Uncontrolled) How have you been since you were released from the hospital?: Better Any questions or concerns?: No  Items Reviewed: Did you receive and understand the discharge instructions provided?: Yes Medications obtained and verified?: Yes (Medications Reviewed) Any new allergies since your discharge?: No Dietary orders reviewed?: No Do you have support at home?: Yes  Home Care and Equipment/Supplies: [place] Ordered?: No Any new equipment or medical supplies ordered?: No  Functional Questionnaire: Do you need assistance with bathing/showering or dressing?: No Do you need assistance with meal preparation?: No Do you need assistance with eating?: No Do you have difficulty maintaining continence: No Do you need assistance with getting out of bed/getting out of a chair/moving?: No Do you have difficulty managing or taking your medications?: No  Folllow up appointments reviewed: PCP Follow-up appointment confirmed?: No MD Provider Line Number:[phone] Given: Yes Date of PCP follow-up appointment?: 11/22/22 Follow-up Provider: [facility] Follow-up appointment confirmed?: NA Do you need transportation to your follow-up appointment?: No Do you understand care options if your condition(s) worsen?: Yes-patient verbalized understanding    [place], [place]

## 2022-11-19 DIAGNOSIS — F17210 Nicotine dependence, cigarettes, uncomplicated: Secondary | ICD-10-CM | POA: Insufficient documentation

## 2022-11-22 ENCOUNTER — Ambulatory Visit: Payer: BC Managed Care – PPO | Admitting: Nurse Practitioner

## 2022-11-22 ENCOUNTER — Encounter: Payer: Self-pay | Admitting: Nurse Practitioner

## 2022-11-22 ENCOUNTER — Encounter (INDEPENDENT_AMBULATORY_CARE_PROVIDER_SITE_OTHER): Payer: BC Managed Care – PPO | Admitting: Family Medicine

## 2022-11-22 VITALS — BP 128/82 | HR 90 | Temp 98.6°F | Ht 61.0 in | Wt 320.0 lb

## 2022-11-22 DIAGNOSIS — T7840XD Allergy, unspecified, subsequent encounter: Secondary | ICD-10-CM | POA: Diagnosis not present

## 2022-11-22 DIAGNOSIS — J4551 Severe persistent asthma with (acute) exacerbation: Secondary | ICD-10-CM | POA: Diagnosis not present

## 2022-11-22 LAB — CBC
Hematocrit: 47.9 % — ABNORMAL HIGH (ref 34.0–46.6)
Hemoglobin: 15.8 g/dL (ref 11.1–15.9)
MCH: 29.9 pg (ref 26.6–33.0)
MCHC: 33 g/dL (ref 31.5–35.7)
MCV: 91 fL (ref 79–97)
Platelets: 203 10*3/uL (ref 150–450)
RBC: 5.28 x10E6/uL (ref 3.77–5.28)
RDW: 11.8 % (ref 11.7–15.4)
WBC: 10.1 10*3/uL (ref 3.4–10.8)

## 2022-11-22 LAB — BMP8+EGFR
BUN/Creatinine Ratio: 13 (ref 9–23)
BUN: 14 mg/dL (ref 6–24)
CO2: 25 mmol/L (ref 20–29)
Calcium: 9.1 mg/dL (ref 8.7–10.2)
Chloride: 100 mmol/L (ref 96–106)
Creatinine, Ser: 1.09 mg/dL — ABNORMAL HIGH (ref 0.57–1.00)
Glucose: 81 mg/dL (ref 70–99)
Potassium: 4.1 mmol/L (ref 3.5–5.2)
Sodium: 140 mmol/L (ref 134–144)
eGFR: 64 mL/min/{1.73_m2} (ref >59)

## 2022-11-22 MED ORDER — PREDNISONE 20 MG PO TABS: ORAL_TABLET | ORAL | 0 refills | Status: DC

## 2022-11-22 NOTE — Progress Notes (Signed)
I,Sheena H Holbrook,acting as a Education administrator for [name], FNP.,have documented all relevant documentation on the behalf of [name], FNP,as directed by  [name], FNP while in the presence of [name], [place].    Subjective:     Patient ID: April Ellis , female    DOB: 1975-12-05 , 47 y.o.   MRN: [id number]   Chief Complaint  Patient presents with   Hospitalization Follow-up   Referral    To allergist   Weight Management    Has appt with [facility] today    HPI  Patient presents today for hospital follow up. She would also like a referral to an allergist. Patient would like to discuss weight management.   She ate pasta salad with black beans, corn, lettuce, tomato and avocado. No seafood. After she ate the pasta she began feeling itching and shortness of breath. She had her son to try to administer her epipen. When she woke up she was in hospital.  She reports she was feeling "okay" prior to the event. She feels like she does not have to eat anything and her mouth will start itching. She was taking benadryl without relief. She will take 1/2 benadryl before she eats anything.   She does not feel like she is back to 100%. She continues to have some breathing issues. She was given prednisone for 3 days on discharge. Does not feel like her breathing is as good as it could be. She has been using her as needed albuterol has used more than 3 times.   Her sister thought she might be having a seizure - she is on wellbutrin but she had similar symptoms prior to wellbutrin. She had been on an increased dose for 10 days. Doing well with wellbutrin at this time.     Past Medical History:  Diagnosis Date   Anemia    Asthma    Bronchitis    COPD (chronic obstructive pulmonary disease) ([place])    Dyspnea    with asthma flair   Obesity    Respiratory arrest ([place]) 04/04/2020     Family History  Problem Relation Age of Onset   Diabetes Mother    Cancer Mother    Diabetes Sister     Hypertension Sister    Hypertension Sister    Urticaria Neg Hx    Immunodeficiency Neg Hx    Eczema Neg Hx    Asthma Neg Hx    Angioedema Neg Hx    Allergic rhinitis Neg Hx      Current Outpatient Medications:    bisoprolol (ZEBETA) 5 MG tablet, Take 2.5 mg by mouth daily., Disp: , Rfl:    buPROPion (WELLBUTRIN XL) 300 MG 24 hr tablet, TAKE 1 TABLET BY MOUTH EVERY DAY IN THE MORNING, Disp: 90 tablet, Rfl: 1   cetirizine (ZYRTEC) 10 MG tablet, Take 10 mg by mouth daily as needed for allergies or rhinitis., Disp: , Rfl:    diphenhydrAMINE (BENADRYL) 25 mg capsule, Take 25 mg by mouth every 8 (eight) hours as needed for allergies., Disp: , Rfl:    empagliflozin (JARDIANCE) 10 MG TABS tablet, Take 1 tablet (10 mg total) by mouth daily., Disp: 30 tablet, Rfl: 3   ENTRESTO 24-26 MG, Take 1 tablet by mouth 2 (two) times daily., Disp: , Rfl:    Fluticasone-Umeclidin-Vilant (TRELEGY ELLIPTA) 200-62.5-25 MCG/ACT AEPB, Inhale 1 puff then rinse mouth, once daily, Disp: 60 each, Rfl: 12   furosemide (LASIX) 20 MG tablet, Take 2 tablets (  40 mg total) by mouth daily., Disp: 30 tablet, Rfl: 1   guaiFENesin (MUCINEX) 600 MG 12 hr tablet, Take 600 mg by mouth 2 (two) times daily., Disp: , Rfl:    guaiFENesin-dextromethorphan (ROBITUSSIN DM) 100-10 MG/5ML syrup, Take 5 mLs by mouth every 4 (four) hours as needed for cough., Disp: 118 mL, Rfl: 0   ipratropium-albuterol (DUONEB) 0.5-2.5 (3) MG/3ML SOLN, Take 3 mLs by nebulization every 4 (four) hours as needed (for wheezing and shortness of breath)., Disp: , Rfl:    JARDIANCE 10 MG TABS tablet, Take 10 mg by mouth daily., Disp: , Rfl:    montelukast (SINGULAIR) 10 MG tablet, Take 1 tablet (10 mg total) by mouth at bedtime. TAKE 1 TABLET BY MOUTH EVERYDAY AT BEDTIME Strength: 10 mg, Disp: 90 tablet, Rfl: 1   nicotine (NICODERM CQ - DOSED IN MG/24 HOURS) 14 mg/24hr patch, Place 1 patch (14 mg total) onto the skin daily., Disp: 30 patch, Rfl: 5   nicotine  polacrilex (NICORETTE) 4 MG lozenge, Take 4 mg by mouth as needed for smoking cessation., Disp: , Rfl:    predniSONE (DELTASONE) 20 MG tablet, Take 2 tablets by mouth daily x 3 days, Disp: 6 tablet, Rfl: 0   sacubitril-valsartan (ENTRESTO) 24-26 MG, Take 1 tablet by mouth 2 (two) times daily. (Patient not taking: Reported on 11/26/2022), Disp: 60 tablet, Rfl: 3   Semaglutide-Weight Management (WEGOVY) 0.25 MG/0.5ML SOAJ, Inject 0.25 mg into the skin once a week., Disp: 2 mL, Rfl: 0   spironolactone (ALDACTONE) 25 MG tablet, Take 12.5 mg by mouth daily., Disp: , Rfl:    albuterol (VENTOLIN HFA) 108 (90 Base) MCG/ACT inhaler, Inhale 2 puffs every 4-6 hours as needed, Disp: 18 g, Rfl: 12   EPINEPHrine 0.3 mg/0.3 mL IJ SOAJ injection, Inject 0.3 mg into the muscle as needed for anaphylaxis., Disp: 2 each, Rfl: 5   predniSONE (DELTASONE) 10 MG tablet, 4 X 2 DAYS, 3 X 2 DAYS, 2 X 2 DAYS, 1 X 2 DAYS, Disp: 20 tablet, Rfl: 0   Allergies  Allergen Reactions   Penicillins Other (See Comments)    Childhood Allergy Did it involve swelling of the face/tongue/throat, SOB, or low BP? UNK Did it involve sudden or severe rash/hives, skin peeling, or any reaction on the inside of your mouth or nose? UNK Did you need to seek medical attention at a hospital or doctor's office? UNK When did it last happen?      more than 10 years If all above answers are "NO", may proceed with cephalosporin use.    P[place] Other (See Comments)    Tested allergic to this     Review of Systems  Constitutional: Negative.   Respiratory: Negative.    Cardiovascular: Negative.   Gastrointestinal: Negative.   Neurological: Negative.   Psychiatric/Behavioral: Negative.       Today's Vitals   11/22/22 1002  BP: 128/82  Pulse: 90  Temp: 98.6 F (37 C)  TempSrc: Oral  SpO2: 93%  Weight: (!) 320 lb (145.2 kg)  Height: '5\' 1"'$  (1.549 m)   Body mass index is 60.46 kg/m.   Objective:  Physical Exam Vitals reviewed.   Constitutional:      General: She is not in acute distress.    Appearance: Normal appearance. She is obese.  Cardiovascular:     Rate and Rhythm: Normal rate and regular rhythm.     Pulses: Normal pulses.     Heart sounds: Normal heart sounds. No murmur heard.  Pulmonary:     Effort: Pulmonary effort is normal. No respiratory distress.     Breath sounds: Normal breath sounds. No wheezing.  Musculoskeletal:     Right lower leg: No edema.     Left lower leg: No edema.  Skin:    General: Skin is warm and dry.     Capillary Refill: Capillary refill takes less than 2 seconds.  Neurological:     General: No focal deficit present.     Mental Status: She is alert and oriented to person, place, and time. Mental status is at baseline.     Cranial Nerves: No cranial nerve deficit.     Motor: No weakness.  Psychiatric:        Mood and Affect: Mood normal.        Behavior: Behavior normal.        Thought Content: Thought content normal.        Judgment: Judgment normal.         Assessment And Plan:     1. Severe persistent asthma with acute exacerbation Comments: Continues to feel like she is wheezing, will treat with additional prednisone, no wheezing however breath sounds are diminished. TCM Performed. A member of the clinical team spoke with the patient upon dischare. Discharge summary was reviewed in full detail during the visit. Meds reconciled and compared to discharge meds. Medication list is updated and reviewed with the patient.  Greater than 50% face to face time was spent in counseling an coordination of care.  All questions were answered to the satisfaction of the patient.   - CBC - BMP8+eGFR - Ambulatory referral to Allergy - predniSONE (DELTASONE) 20 MG tablet; Take 2 tablets by mouth daily x 3 days  Dispense: 6 tablet; Refill: 0  2. Allergic reaction, subsequent encounter - Ambulatory referral to Allergy - predniSONE (DELTASONE) 20 MG tablet; Take 2 tablets by mouth daily  x 3 days  Dispense: 6 tablet; Refill: 0     Patient was given opportunity to ask questions. Patient verbalized understanding of the plan and was able to repeat key elements of the plan. All questions were answered to their satisfaction.  [name], FNP   I, [name], FNP, have reviewed all documentation for this visit. The documentation on 11/22/22 for the exam, diagnosis, procedures, and orders are all accurate and complete.   IF YOU HAVE BEEN REFERRED TO A SPECIALIST, IT MAY TAKE 1-2 WEEKS TO SCHEDULE/PROCESS THE REFERRAL. IF YOU HAVE NOT HEARD FROM US/SPECIALIST IN TWO WEEKS, PLEASE GIVE [place] A CALL AT [phone] X [phone].   THE PATIENT IS ENCOURAGED TO PRACTICE SOCIAL DISTANCING DUE TO THE COVID-19 PANDEMIC.

## 2022-11-24 ENCOUNTER — Encounter: Payer: Self-pay | Admitting: Internal Medicine

## 2022-11-24 NOTE — Assessment & Plan Note (Signed)
Continues cardiology follow-up

## 2022-11-24 NOTE — Assessment & Plan Note (Signed)
She really 4 to smoke at all.  This was emphasized again.  She claims she is only smoking 1 or 2 cigarettes a day. Plan-NicoDerm CQ patches

## 2022-11-24 NOTE — Assessment & Plan Note (Signed)
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 Plan-prescription for Trelegy inhaler.  Labs for IgE and eosinophils.  Emphasis on smoking cessation.  Formerly on Radio broadcast assistant.

## 2022-11-24 NOTE — Progress Notes (Signed)
HPI F smoker followed for Asthma, COPD, Chronic Rhinitis, Urticaria, Morbid Obesity, Iron Def Anemia Office spirometry 2017 by [place] office- mild obstruction PFT 05/01/14- Severe obstruction. 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 HST 07/16/22- Dr Radford Pax- AHI 12/ hr (no centrals), O2 desat to 81%/ < 88% for 76 minutes,  IgE 234,  EOS low on repeated steroid bursts (04/04/19) ===============================================================================   10/28/22- 46  F Smoker (1-2 cigs/ day) followed for Asthma/ Xolair, Asthma/COPD,  hx Cardiopulmonary Arrest, Chronic Rhinitis, Urticaria,  OSA,  Morbid Obesity, Iron Def Anemia, CHF, Dilated Cardiomyopathy, HTN, -Albuterol hfa, Singulair, Trelegy 200, neb Duoneb, Nicoderm CQ/,,  HST 07/16/22- Dr Radford Pax- AHI 12/ hr (no centrals), O2 desat to 81%/ < 88% for 76 minutes,  Hosp 11/19-11/20/23 Asthma-COPD overlap, Acute Resp Fail, CHF, Polycythemia, Body weight today-327 lbs Covid vax-2 Phizer Flu vax-     declines                    Restarted on CPAP by cardiology after updated sleep study.  Never had home oxygen.  Dropped off Xolair about 2 years ago because of insurance problems before COVID. Just started Trelegy and using nebulizer about 2 times daily. She describes onset of asthma attack is noting itching in mouth and face, then becomes short of breath and takes Benadryl. Works from [employer]	[place].  Lives in house with no pets and no recognized mold problem. We will check labs for allergy markers, refill prescription for Trelegy and for nicotine patches.  Smoking cessation emphasized. CXR 08/05/22- 1V- IMPRESSION: No active disease.              former Xolair- ? Restart Biologic  11/26/22- 51  F Smoker (1-2 cigs/ day) followed for Asthma/ COPD/Xolair,  hx  Cardiopulmonary Arrest, Chronic Rhinitis, Urticaria,  OSA,  Morbid Obesity, Iron Def Anemia, CHF/ HFrEF, Dilated Cardiomyopathy, HTN, -Albuterol hfa, Singulair, Trelegy 200, neb Duoneb, Nicoderm CQ/,,  HST 07/16/22- Dr Radford Pax- AHI 12/ hr (no centrals), O2 desat to 81%/ < 88% for 76 minutes,  Hosp 11/19-11/20/23 Asthma-COPD overlap, Acute Resp Fail, CHF, Polycythemia, Body weight today- Covid vax-2 Phizer Flu vax-       Lab 11/01/22- Eos 5%, IgE 332 H ------Patient recently in the hospital for asthma exacerbation, she advises she is feeling some better. Still experiencing sob. Quit smoking 1 week ago using Nicotine patches. Hosp 2/16-2/18/24- Found down "asthma" despite Epipen. This was her second hospitalization in recent months for asthma complications.  She says this episode began with itching in her mouth and throat.  Her son was trying to give her EpiPen but she does not know if it was accomplished. Her primary provider has suggested referral to an allergist which I think is fine. I reviewed labs with her and we discussed elevated IgE.  We would like to try again to get her on a Biologic.  I thought her Xolair previously was stopped because of insurance coverage but she does not think application was ever completed. She would like a prednisone taper to help finish recovery from recent acute episode.  Steroid talk done. CXR 11/12/22- 1V IMPRESSION: No active disease. Borderline cardiomegaly.  ROS-see HPI   + = positive Constitutional:    weight loss, night sweats, fevers, chills, fatigue, lassitude. HEENT:    headaches, difficulty swallowing, tooth/dental problems, sore throat,  sneezing, itching, ear ache, nasal congestion, post nasal drip, snoring CV:    chest pain, orthopnea, PND, swelling in lower extremities, anasarca,                                   dizziness, palpitations Resp:  + shortness of breath with exertion or at rest.                productive cough,   +non-productive  cough, coughing up of blood.              change in color of mucus.  +wheezing.   Skin:    rash or lesions. GI:  No-   heartburn, indigestion, abdominal pain, nausea, vomiting, diarrhea,                 change in bowel habits, loss of appetite GU: dysuria, change in color of urine, no urgency or frequency.   flank pain. MS:   joint pain, stiffness, decreased range of motion, back pain. Neuro-     nothing unusual Psych:  change in mood or affect.  depression or anxiety.   memory loss.  OBJ- Physical Exam General- Alert, Oriented, Affect-appropriate, Distress- none acute, +morbidly obese Skin- rash-none, lesions- none, excoriation- none Lymphadenopathy- none Head- atraumatic            Eyes- Gross vision intact, PERRLA, conjunctivae and secretions clear            Ears- Hearing, canals-normal            Nose- Clear, no-Septal dev, mucus, polyps, erosion, perforation             Throat- Mallampati III, mucosa clear , drainage- none, tonsils- atrophic Neck- flexible , trachea midline, no stridor , thyroid nl, carotid no bruit Chest - symmetrical excursion , unlabored           Heart/CV- RRR , no murmur , no gallop  , no rub, nl s1 s2                           - JVD- none , edema- none, stasis changes- none, varices- none           Lung- +trace wheeze L base, ,  wheeze- none, cough+dry , dullness-none, rub- none           Chest wall-  Abd-  Br/ Gen/ Rectal- Not done, not indicated Extrem- cyanosis- none, clubbing, none, atrophy- none, strength- nl Neuro- grossly intact to observation

## 2022-11-26 ENCOUNTER — Ambulatory Visit (INDEPENDENT_AMBULATORY_CARE_PROVIDER_SITE_OTHER): Payer: BC Managed Care – PPO | Admitting: Internal Medicine

## 2022-11-26 ENCOUNTER — Telehealth: Payer: Self-pay | Admitting: Pharmacist

## 2022-11-26 ENCOUNTER — Encounter: Payer: Self-pay | Admitting: Internal Medicine

## 2022-11-26 VITALS — BP 120/78 | HR 67 | Ht 61.0 in | Wt 324.6 lb

## 2022-11-26 DIAGNOSIS — G4733 Obstructive sleep apnea (adult) (pediatric): Secondary | ICD-10-CM

## 2022-11-26 DIAGNOSIS — J4489 Other specified chronic obstructive pulmonary disease: Secondary | ICD-10-CM | POA: Diagnosis not present

## 2022-11-26 DIAGNOSIS — Z720 Tobacco use: Secondary | ICD-10-CM

## 2022-11-26 MED ORDER — EPINEPHRINE 0.3 MG/0.3ML IJ SOAJ: 0.3000 mg | INTRAMUSCULAR | 5 refills | Status: DC | PRN

## 2022-11-26 MED ORDER — PREDNISONE 10 MG PO TABS: ORAL_TABLET | ORAL | 0 refills | Status: DC

## 2022-11-26 MED ORDER — ALBUTEROL SULFATE HFA 108 (90 BASE) MCG/ACT IN AERS: INHALATION_SPRAY | RESPIRATORY_TRACT | 12 refills | Status: DC

## 2022-11-26 NOTE — Assessment & Plan Note (Signed)
She reports stopping last cigarette use 1 week ago.  On nicotine patch.  Encouraged total cessation.

## 2022-11-26 NOTE — Assessment & Plan Note (Signed)
Severe persistent.  2 recent hospitalizations. Plan-prednisone taper with discussion, refill rescue inhaler, application for Dupixent

## 2022-11-26 NOTE — Assessment & Plan Note (Signed)
No longer using CPAP.

## 2022-11-26 NOTE — Telephone Encounter (Signed)
Received notification from [facility], that pt will be new start to [place]. Paperwork has been completed at [facility]  Dx: oral steroid-dependent asthma Eosinophil <50 per last CBC w diff (may be secondary to chronic prednisone use)  Key: [id number]  Knox Saliva, PharmD, MPH, BCPS, CPP Clinical Pharmacist (Rheumatology and Pulmonology)

## 2022-11-26 NOTE — Patient Instructions (Signed)
Script sent for prednisone taper  Refills sent for albuterol rescue inhaler and Epipen  Order- application sheet to start [place] for severe asthma, persistent

## 2022-11-30 ENCOUNTER — Telehealth: Payer: Self-pay

## 2022-11-30 NOTE — Telephone Encounter (Addendum)
Patient called to report she has spoken with [employer] and they advised her they MAY cover the following IF she has pre-diabetes: Ozempic, Trulicity, Victoza, and possibly Rybelsus.   Per chart patient does not show as pre-diabetic. Explained A1C levels to patient. She is very worried about not being able to lose weight and its long term effects on her overall health. Patient also asks for 60 days STD to be out of work, she will drop off paperwork to be completed.   Please advise on request for medication. Thanks!

## 2022-12-02 NOTE — Telephone Encounter (Signed)
Received notification from [facility] regarding a prior authorization for [place]. Authorization has been APPROVED from 11/30/2022 to 11/30/2023. Approval letter sent to scan center.  Unable to run test claim because patient must fill through  [facility] # [id number]  Patient will need to be enrolled into Dupixent copay card and then scheduled for Dupixent new start visit.  Knox Saliva, PharmD, MPH, BCPS, CPP Clinical Pharmacist (Rheumatology and Pulmonology)

## 2022-12-02 NOTE — Telephone Encounter (Signed)
Patient calling to follow up on request for STD, she states she would be ok with 30 days if you are agreeable to that. She will drop off paperwork for STD and to renew her FMLA tomorrow.

## 2022-12-03 ENCOUNTER — Other Ambulatory Visit (HOSPITAL_COMMUNITY): Payer: Self-pay

## 2022-12-05 DIAGNOSIS — G4733 Obstructive sleep apnea (adult) (pediatric): Secondary | ICD-10-CM | POA: Diagnosis not present

## 2022-12-06 ENCOUNTER — Encounter (HOSPITAL_COMMUNITY): Payer: Self-pay

## 2022-12-06 ENCOUNTER — Observation Stay (HOSPITAL_COMMUNITY)
Admission: EM | Admit: 2022-12-06 | Discharge: 2022-12-08 | Disposition: A | Discharge status: Home / Self Care | Payer: BC Managed Care – PPO | Attending: Emergency Medicine | Admitting: Emergency Medicine

## 2022-12-06 ENCOUNTER — Emergency Department (HOSPITAL_COMMUNITY): Payer: BC Managed Care – PPO

## 2022-12-06 ENCOUNTER — Other Ambulatory Visit: Payer: Self-pay

## 2022-12-06 ENCOUNTER — Telehealth: Payer: BC Managed Care – PPO | Admitting: Physician Assistant

## 2022-12-06 ENCOUNTER — Telehealth: Payer: BC Managed Care – PPO

## 2022-12-06 DIAGNOSIS — U071 COVID-19: Secondary | ICD-10-CM | POA: Diagnosis not present

## 2022-12-06 DIAGNOSIS — Z7952 Long term (current) use of systemic steroids: Secondary | ICD-10-CM | POA: Insufficient documentation

## 2022-12-06 DIAGNOSIS — I110 Hypertensive heart disease with heart failure: Secondary | ICD-10-CM | POA: Insufficient documentation

## 2022-12-06 DIAGNOSIS — J4489 Other specified chronic obstructive pulmonary disease: Secondary | ICD-10-CM | POA: Diagnosis not present

## 2022-12-06 DIAGNOSIS — Z720 Tobacco use: Secondary | ICD-10-CM | POA: Diagnosis present

## 2022-12-06 DIAGNOSIS — Z6841 Body Mass Index (BMI) 40.0 and over, adult: Secondary | ICD-10-CM | POA: Diagnosis not present

## 2022-12-06 DIAGNOSIS — J449 Chronic obstructive pulmonary disease, unspecified: Secondary | ICD-10-CM | POA: Insufficient documentation

## 2022-12-06 DIAGNOSIS — R0602 Shortness of breath: Secondary | ICD-10-CM | POA: Diagnosis not present

## 2022-12-06 DIAGNOSIS — J4551 Severe persistent asthma with (acute) exacerbation: Secondary | ICD-10-CM

## 2022-12-06 DIAGNOSIS — Z87891 Personal history of nicotine dependence: Secondary | ICD-10-CM | POA: Diagnosis not present

## 2022-12-06 DIAGNOSIS — I5042 Chronic combined systolic (congestive) and diastolic (congestive) heart failure: Secondary | ICD-10-CM | POA: Diagnosis present

## 2022-12-06 DIAGNOSIS — J441 Chronic obstructive pulmonary disease with (acute) exacerbation: Secondary | ICD-10-CM | POA: Diagnosis not present

## 2022-12-06 DIAGNOSIS — R0902 Hypoxemia: Secondary | ICD-10-CM

## 2022-12-06 DIAGNOSIS — J45901 Unspecified asthma with (acute) exacerbation: Secondary | ICD-10-CM | POA: Diagnosis present

## 2022-12-06 DIAGNOSIS — I10 Essential (primary) hypertension: Secondary | ICD-10-CM | POA: Diagnosis present

## 2022-12-06 DIAGNOSIS — G4733 Obstructive sleep apnea (adult) (pediatric): Secondary | ICD-10-CM | POA: Diagnosis present

## 2022-12-06 DIAGNOSIS — Z79899 Other long term (current) drug therapy: Secondary | ICD-10-CM | POA: Insufficient documentation

## 2022-12-06 DIAGNOSIS — J9601 Acute respiratory failure with hypoxia: Secondary | ICD-10-CM | POA: Insufficient documentation

## 2022-12-06 DIAGNOSIS — F32A Depression, unspecified: Secondary | ICD-10-CM | POA: Diagnosis present

## 2022-12-06 DIAGNOSIS — R062 Wheezing: Secondary | ICD-10-CM | POA: Diagnosis not present

## 2022-12-06 LAB — CBC
HCT: 49.2 % — ABNORMAL HIGH (ref 36.0–46.0)
Hemoglobin: 15.2 g/dL — ABNORMAL HIGH (ref 12.0–15.0)
MCH: 29.7 pg (ref 26.0–34.0)
MCHC: 30.9 g/dL (ref 30.0–36.0)
MCV: 96.3 fL (ref 80.0–100.0)
Platelets: 200 10*3/uL (ref 150–400)
RBC: 5.11 MIL/uL (ref 3.87–5.11)
RDW: 13 % (ref 11.5–15.5)
WBC: 14.5 10*3/uL — ABNORMAL HIGH (ref 4.0–10.5)
nRBC: 0 % (ref 0.0–0.2)

## 2022-12-06 LAB — COMPREHENSIVE METABOLIC PANEL
ALT: 19 U/L (ref 0–44)
AST: 16 U/L (ref 15–41)
Albumin: 3.6 g/dL (ref 3.5–5.0)
Alkaline Phosphatase: 67 U/L (ref 38–126)
Anion gap: 7 (ref 5–15)
BUN: 12 mg/dL (ref 6–20)
CO2: 30 mmol/L (ref 22–32)
Calcium: 8.4 mg/dL — ABNORMAL LOW (ref 8.9–10.3)
Chloride: 103 mmol/L (ref 98–111)
Creatinine, Ser: 0.76 mg/dL (ref 0.44–1.00)
GFR, Estimated: >60 mL/min (ref >60)
Glucose, Bld: 99 mg/dL (ref 70–99)
Potassium: 3.7 mmol/L (ref 3.5–5.1)
Sodium: 140 mmol/L (ref 135–145)
Total Bilirubin: 1.1 mg/dL (ref 0.3–1.2)
Total Protein: 7 g/dL (ref 6.5–8.1)

## 2022-12-06 LAB — FERRITIN: Ferritin: 48 ng/mL (ref 11–307)

## 2022-12-06 LAB — BLOOD GAS, VENOUS
Acid-Base Excess: 10.2 mmol/L — ABNORMAL HIGH (ref 0.0–2.0)
Bicarbonate: 37.9 mmol/L — ABNORMAL HIGH (ref 20.0–28.0)
O2 Saturation: 88 %
Patient temperature: 37
pCO2, Ven: 64 mmHg — ABNORMAL HIGH (ref 44–60)
pH, Ven: 7.38 (ref 7.25–7.43)
pO2, Ven: 53 mmHg — ABNORMAL HIGH (ref 32–45)

## 2022-12-06 LAB — RESP PANEL BY RT-PCR (RSV, FLU A&B, COVID)  RVPGX2
Influenza A by PCR: NEGATIVE
Influenza B by PCR: NEGATIVE
Resp Syncytial Virus by PCR: NEGATIVE
SARS Coronavirus 2 by RT PCR: POSITIVE — AB

## 2022-12-06 LAB — I-STAT BETA HCG BLOOD, ED (MC, WL, AP ONLY): I-stat hCG, quantitative: <5 m[IU]/mL (ref <5)

## 2022-12-06 LAB — D-DIMER, QUANTITATIVE: D-Dimer, Quant: <0.27 ug/mL-FEU (ref 0.00–0.50)

## 2022-12-06 LAB — TROPONIN I (HIGH SENSITIVITY): Troponin I (High Sensitivity): 29 ng/L — ABNORMAL HIGH (ref <18)

## 2022-12-06 LAB — LACTATE DEHYDROGENASE: LDH: 146 U/L (ref 98–192)

## 2022-12-06 LAB — BRAIN NATRIURETIC PEPTIDE: B Natriuretic Peptide: 33.7 pg/mL (ref 0.0–100.0)

## 2022-12-06 LAB — C-REACTIVE PROTEIN: CRP: 1 mg/dL — ABNORMAL HIGH (ref <1.0)

## 2022-12-06 MED ORDER — ALBUTEROL SULFATE (2.5 MG/3ML) 0.083% IN NEBU
7.5000 mg | INHALATION_SOLUTION | Freq: Once | RESPIRATORY_TRACT | Status: AC
  Administered 2022-12-06: 7.5 mg via RESPIRATORY_TRACT

## 2022-12-06 MED ORDER — ALBUTEROL SULFATE (2.5 MG/3ML) 0.083% IN NEBU
INHALATION_SOLUTION | RESPIRATORY_TRACT | Status: AC
  Filled 2022-12-06: qty 9

## 2022-12-06 MED ORDER — METHYLPREDNISOLONE SODIUM SUCC 125 MG IJ SOLR
125.0000 mg | INTRAMUSCULAR | Status: AC
  Administered 2022-12-06: 125 mg via INTRAVENOUS
  Filled 2022-12-06: qty 2

## 2022-12-06 MED ORDER — FUROSEMIDE 40 MG PO TABS
40.0000 mg | ORAL_TABLET | Freq: Every day | ORAL | Status: DC
  Administered 2022-12-07 – 2022-12-08 (×2): 40 mg via ORAL
  Filled 2022-12-06 (×2): qty 1

## 2022-12-06 MED ORDER — LACTATED RINGERS IV BOLUS
1000.0000 mL | Freq: Once | INTRAVENOUS | Status: AC
  Administered 2022-12-06: 1000 mL via INTRAVENOUS

## 2022-12-06 MED ORDER — PREDNISONE 20 MG PO TABS: 40.0000 mg | ORAL_TABLET | Freq: Every day | ORAL | 0 refills | Status: DC

## 2022-12-06 MED ORDER — ENOXAPARIN SODIUM 60 MG/0.6ML IJ SOSY
60.0000 mg | PREFILLED_SYRINGE | Freq: Every day | INTRAMUSCULAR | Status: DC
  Administered 2022-12-06: 60 mg via SUBCUTANEOUS
  Filled 2022-12-06: qty 0.6

## 2022-12-06 MED ORDER — AZITHROMYCIN 250 MG PO TABS: ORAL_TABLET | ORAL | 0 refills | Status: DC

## 2022-12-06 MED ORDER — IPRATROPIUM-ALBUTEROL 0.5-2.5 (3) MG/3ML IN SOLN
3.0000 mL | Freq: Once | RESPIRATORY_TRACT | Status: AC
  Administered 2022-12-06: 3 mL via RESPIRATORY_TRACT

## 2022-12-06 MED ORDER — ACETAMINOPHEN 650 MG RE SUPP: 650.0000 mg | Freq: Four times a day (QID) | RECTAL | Status: DC | PRN

## 2022-12-06 MED ORDER — NICOTINE 7 MG/24HR TD PT24
7.0000 mg | MEDICATED_PATCH | Freq: Every day | TRANSDERMAL | Status: DC
  Administered 2022-12-07 – 2022-12-08 (×2): 7 mg via TRANSDERMAL
  Filled 2022-12-06 (×2): qty 1

## 2022-12-06 MED ORDER — IPRATROPIUM-ALBUTEROL 0.5-2.5 (3) MG/3ML IN SOLN: 3.0000 mL | Freq: Four times a day (QID) | RESPIRATORY_TRACT | Status: DC | PRN

## 2022-12-06 MED ORDER — DEXAMETHASONE 4 MG PO TABS
6.0000 mg | ORAL_TABLET | Freq: Every day | ORAL | Status: DC
  Administered 2022-12-07 – 2022-12-08 (×2): 6 mg via ORAL
  Filled 2022-12-06 (×2): qty 2

## 2022-12-06 MED ORDER — ACETAMINOPHEN 325 MG PO TABS: 650.0000 mg | ORAL_TABLET | Freq: Four times a day (QID) | ORAL | Status: DC | PRN

## 2022-12-06 MED ORDER — SODIUM CHLORIDE 0.9 % IV SOLN
200.0000 mg | Freq: Once | INTRAVENOUS | Status: AC
  Administered 2022-12-06: 200 mg via INTRAVENOUS
  Filled 2022-12-06: qty 40

## 2022-12-06 MED ORDER — IPRATROPIUM-ALBUTEROL 0.5-2.5 (3) MG/3ML IN SOLN
RESPIRATORY_TRACT | Status: AC
  Filled 2022-12-06: qty 3

## 2022-12-06 MED ORDER — IPRATROPIUM-ALBUTEROL 0.5-2.5 (3) MG/3ML IN SOLN
3.0000 mL | Freq: Four times a day (QID) | RESPIRATORY_TRACT | Status: DC
  Administered 2022-12-06: 3 mL via RESPIRATORY_TRACT
  Filled 2022-12-06: qty 3

## 2022-12-06 MED ORDER — SODIUM CHLORIDE 0.9 % IV SOLN
100.0000 mg | Freq: Every day | INTRAVENOUS | Status: DC
  Administered 2022-12-07: 100 mg via INTRAVENOUS
  Filled 2022-12-06: qty 20

## 2022-12-06 MED ORDER — BUPROPION HCL ER (XL) 300 MG PO TB24
300.0000 mg | ORAL_TABLET | Freq: Every day | ORAL | Status: DC
  Administered 2022-12-07 – 2022-12-08 (×2): 300 mg via ORAL
  Filled 2022-12-06 (×2): qty 1

## 2022-12-06 MED ORDER — MAGNESIUM SULFATE 2 GM/50ML IV SOLN
2.0000 g | Freq: Once | INTRAVENOUS | Status: AC
  Administered 2022-12-06: 2 g via INTRAVENOUS
  Filled 2022-12-06: qty 50

## 2022-12-06 MED ORDER — SODIUM CHLORIDE 0.9% FLUSH
3.0000 mL | Freq: Two times a day (BID) | INTRAVENOUS | Status: DC
  Administered 2022-12-06 – 2022-12-08 (×4): 3 mL via INTRAVENOUS

## 2022-12-06 NOTE — ED Notes (Signed)
CAT neb complete, "feeling better", remains on 4L  humidified. VSS. Sitting upright, NAD, calm.

## 2022-12-06 NOTE — ED Notes (Signed)
EDP at [facility]. RT initiating nebs

## 2022-12-06 NOTE — ED Notes (Signed)
EDP at [facility] 

## 2022-12-06 NOTE — Assessment & Plan Note (Signed)
-   Ongoing tobacco use.  Patient again counseled on cessation in the ER - Continue nicotine patch

## 2022-12-06 NOTE — H&P (Signed)
History and Physical    April Ellis  [id number]  DOB: [birth year]  DOA: 12/06/2022  PCP: [name], FNP Patient coming from: Home  Chief Complaint: SOB  HPI:  April Ellis is a 47 yo female with PMH COPD, asthma, OSA, morbid obesity, chronic combined systolic/diastolic CHF, HTN, depression who presented to the ER with worsening shortness of breath. She also had had an outpatient virtual visit today for similar symptoms.  She has had symptoms which started 2-3 days ago although due to being on prolonged course of prednisone outpatient, she is unclear when her last taper finished but appears to have finished around the 8th or 9th of March after seen by Dr. Annamaria Boots on 3/1 and re-started on prednisone x 8 days.  She had resolution of symptoms from prior hospitalization (see below).   Today after being seen by virtual visit, she was started on azithromycin and prednisone; she took 1 dose of prednisone 40 mg and used her inhaler before presenting to the ER. On arrival she was found to be hypoxic, 68% on room air, and placed on 15 L NRB.  She had rapid improvement after being treated with nebulizers, magnesium, and Solu-Medrol.  She was able to be weaned down to 2 L oxygen in the ER with overall improvement in respiratory distress which was present on arrival.  Recent hospitalization from 2/16 - 2/18 for asthma exacerbation with syncope from presumed respiratory distress.  She was treated with nonrebreather, steroids, and Lasix dose was also increased prior to discharge.  She followed up outpatient on 11/22/2022 with lingering symptoms and was continued on prednisone course for 3 more days. She has also since been seen by pulmonology on 11/26/2022.  She was continued on Trelegy and smoking cessation was again emphasized.  She also was continued on prednisone taper. She has had elevated IgE noted and has been trying to get on MAB per pulmonology.  Xolair application appears yet to be completed and  seems to be being pursued outpatient per last pulmonology note 3/1.   In context of COVID-19 infection, patient is admitted for treatment and at least monitoring overnight.  I have personally briefly reviewed patient's old medical records in [facility] and discussed patient with the ER provider when appropriate/indicated.  Assessment and Plan: * COVID-19 virus infection - presented with 2-3 or less of symptoms but has had a lingering course of symptoms since mid-Feb hospitalization though can distinctly note feeling worse just prior to admission - given early symptom onset and severity of symptoms on admission (required NRB 15L and rather prominent respi distress) and underlying co-morbidities, will at least start with remdesivir - if inflammatory markers are reassuring and she continues to have rapid symptom resolution, would be reasonable to de-escalate to Paxlovid on 12/07/2022 and consider possibly even discharging home if able to be weaned off oxygen - also given rapid improvement in ER, will hold off on Baricitinib or tociluzumab unless were to symptomatically worsen or inflammatory markers worsen as well - CT value 26.6   Acute respiratory failure with hypoxia ([place]) - Initially presented with severe hypoxia, 68% on room air.  Required 15 L NRB in ER, although did have rapid improvement -Currently on 2 L when being admitted - Does continue to still smoke a few cigarettes a day, she was again encouraged for strict cessation -Continue oxygen and wean off as able - Will need walk test prior to discharge also - Continue Decadron  Asthma with COPD with exacerbation ([place]) -  Suspected exacerbation in setting of COVID-19 infection - See COVID-19 treatment as well - Continue steroids, DuoNebs, oxygen  Asthma-COPD overlap syndrome - Follows outpatient with pulmonology, recently seen on 11/26/2022, Dr. Annamaria Boots - Tentative plan for trying to initiate Xolair if able - See further workup  above  Chronic combined systolic and diastolic CHF (congestive heart failure) (HCC) - No S/S exacerbation - BNP not elevated and CXR negative for signs of pulmonary edema/effusion -Prior Lasix dose was increased to 40 mg daily after February hospitalization - Continue home dosing of Lasix  Tobacco abuse - Ongoing tobacco use.  Patient again counseled on cessation in the ER - Continue nicotine patch  Morbid obesity with BMI of 60.0-69.9, adult ([place]) - Complicates overall prognosis and care - Body mass index is 61.65 kg/m.  Obstructive sleep apnea - Continue nightly CPAP  Depression - Continue Wellbutrin  Essential hypertension - BP currently stable - Will await med rec resolution otherwise will resume home meds if needed prior if blood pressure elevates    Code Status:     Code Status: Full Code  DVT Prophylaxis: Lovenox     Anticipated disposition is to: Home 1-2 days  History: Past Medical History:  Diagnosis Date   Anemia    Asthma    Bronchitis    COPD (chronic obstructive pulmonary disease) ([place])    Dyspnea    with asthma flair   Obesity    Respiratory arrest ([place]) 04/04/2020    Past Surgical History:  Procedure Laterality Date   DENTAL SURGERY     2 teeth pulled, 11/22/14   DILITATION & CURRETTAGE/HYSTROSCOPY WITH NOVASURE ABLATION N/A 03/05/2020   Procedure: DILATATION & CURETTAGE/HYSTEROSCOPY WITH NOVASURE ABLATION;  Surgeon: Molli Posey, MD;  Location: [facility];  Service: Gynecology;  Laterality: N/A;   NO PAST SURGERIES     RIGHT/LEFT HEART CATH AND CORONARY ANGIOGRAPHY N/A 06/11/2022   Procedure: RIGHT/LEFT HEART CATH AND CORONARY ANGIOGRAPHY;  Surgeon: Early Osmond, MD;  Location: [facility];  Service: Cardiovascular;  Laterality: N/A;     reports that she quit smoking about 2 weeks ago. Her smoking use included cigarettes. She has a 3.75 pack-year smoking history. She has been exposed to tobacco smoke. She has never used smokeless  tobacco. She reports current alcohol use. She reports current drug use. Drug: Marijuana.  Allergies  Allergen Reactions   Penicillins Other (See Comments)    Childhood Allergy Did it involve swelling of the face/tongue/throat, SOB, or low BP? UNK Did it involve sudden or severe rash/hives, skin peeling, or any reaction on the inside of your mouth or nose? UNK Did you need to seek medical attention at a hospital or doctor's office? UNK When did it last happen?      more than 10 years If all above answers are "NO", may proceed with cephalosporin use.    Pineapple Other (See Comments)    Tested allergic to this    Family History  Problem Relation Age of Onset   Diabetes Mother    Cancer Mother    Diabetes Sister    Hypertension Sister    Hypertension Sister    Urticaria Neg Hx    Immunodeficiency Neg Hx    Eczema Neg Hx    Asthma Neg Hx    Angioedema Neg Hx    Allergic rhinitis Neg Hx    Home Medications: Prior to Admission medications   Medication Sig Start Date End Date Taking? Authorizing Provider  albuterol (VENTOLIN HFA) 108 (  90 Base) MCG/ACT inhaler Inhale 2 puffs every 4-6 hours as needed 11/26/22   Baird Lyons D, MD  azithromycin (ZITHROMAX) 250 MG tablet Take 2 tablets on day 1, then 1 tablet daily on days 2 through 5 12/06/22 12/11/22  Mar Daring, PA-C  bisoprolol (ZEBETA) 5 MG tablet Take 2.5 mg by mouth daily. 10/04/22   [provider]  buPROPion (WELLBUTRIN XL) 300 MG 24 hr tablet TAKE 1 TABLET BY MOUTH EVERY DAY IN THE MORNING 11/02/22   [name], FNP  cetirizine (ZYRTEC) 10 MG tablet Take 10 mg by mouth daily as needed for allergies or rhinitis.    [provider]  diphenhydrAMINE (BENADRYL) 25 mg capsule Take 25 mg by mouth every 8 (eight) hours as needed for allergies.    [provider]  empagliflozin (JARDIANCE) 10 MG TABS tablet Take 1 tablet (10 mg total) by mouth daily. 06/16/22   Joette Catching, PA-C  ENTRESTO  24-26 MG Take 1 tablet by mouth 2 (two) times daily. 09/06/22   [provider]  EPINEPHrine 0.3 mg/0.3 mL IJ SOAJ injection Inject 0.3 mg into the muscle as needed for anaphylaxis. 11/26/22   Deneise Lever, MD  Fluticasone-Umeclidin-Vilant (TRELEGY ELLIPTA) 200-62.5-25 MCG/ACT AEPB Inhale 1 puff then rinse mouth, once daily 10/28/22   Baird Lyons D, MD  furosemide (LASIX) 20 MG tablet Take 2 tablets (40 mg total) by mouth daily. 11/14/22   Lorella Nimrod, MD  guaiFENesin (MUCINEX) 600 MG 12 hr tablet Take 600 mg by mouth 2 (two) times daily. 10/30/22   [provider]  guaiFENesin-dextromethorphan (ROBITUSSIN DM) 100-10 MG/5ML syrup Take 5 mLs by mouth every 4 (four) hours as needed for cough. 11/14/22   Lorella Nimrod, MD  ipratropium-albuterol (DUONEB) 0.5-2.5 (3) MG/3ML SOLN Take 3 mLs by nebulization every 4 (four) hours as needed (for wheezing and shortness of breath). 10/01/22   [provider]  JARDIANCE 10 MG TABS tablet Take 10 mg by mouth daily. 09/05/22   [provider]  montelukast (SINGULAIR) 10 MG tablet Take 1 tablet (10 mg total) by mouth at bedtime. TAKE 1 TABLET BY MOUTH EVERYDAY AT BEDTIME Strength: 10 mg 04/21/22   [name], FNP  nicotine (NICODERM CQ - DOSED IN MG/24 HOURS) 14 mg/24hr patch Place 1 patch (14 mg total) onto the skin daily. 10/28/22 10/28/23  Baird Lyons D, MD  nicotine polacrilex (NICORETTE) 4 MG lozenge Take 4 mg by mouth as needed for smoking cessation.    [provider]  predniSONE (DELTASONE) 20 MG tablet Take 2 tablets (40 mg total) by mouth daily with breakfast. 12/06/22   Burnette, Clearnce Sorrel, PA-C  sacubitril-valsartan (ENTRESTO) 24-26 MG Take 1 tablet by mouth 2 (two) times daily. Patient not taking: Reported on 11/26/2022 06/12/22   Rai, Vernelle Emerald, MD  Semaglutide-Weight Management (WEGOVY) 0.25 MG/0.5ML SOAJ Inject 0.25 mg into the skin once a week. 11/02/22   [name], FNP  spironolactone (ALDACTONE) 25 MG  tablet Take 12.5 mg by mouth daily. 10/16/22   [provider]    Review of Systems:  Review of Systems  Constitutional:  Negative for chills and fever.  HENT: Negative.    Eyes: Negative.   Respiratory:  Positive for shortness of breath and wheezing.   Cardiovascular:  Positive for leg swelling. Negative for chest pain and palpitations.  Gastrointestinal: Negative.   Genitourinary: Negative.   Musculoskeletal: Negative.   Skin: Negative.   Neurological: Negative.   Endo/Heme/Allergies: Negative.  Psychiatric/Behavioral: Negative.      Physical Exam:  Vitals:   12/06/22 1800 12/06/22 1815 12/06/22 1845 12/06/22 1900  BP: 124/83 123/78 (!) 125/98 (!) 137/95  Pulse: (!) 105 100 (!) 102 (!) 106  Resp: '17 20 20 20  '$ Temp:      TempSrc:      SpO2: 98% 96% 98% 96%  Weight:      Height:       Physical Exam Constitutional:      General: She is not in acute distress.    Appearance: She is well-developed. She is obese. She is not ill-appearing.  HENT:     Head: Normocephalic and atraumatic.     Mouth/Throat:     Mouth: Mucous membranes are moist.  Eyes:     Extraocular Movements: Extraocular movements intact.  Cardiovascular:     Rate and Rhythm: Normal rate and regular rhythm.  Pulmonary:     Effort: Pulmonary effort is normal. No tachypnea, accessory muscle usage or respiratory distress.     Breath sounds: Wheezing present.  Abdominal:     General: Bowel sounds are normal.     Palpations: Abdomen is soft. There is no hepatomegaly or mass.  Musculoskeletal:        General: Normal range of motion.     Cervical back: Normal range of motion and neck supple.     Right lower leg: Edema (baseline 1-2+ edema) present.     Left lower leg: Edema (baseline 1-2+ edema) present.  Skin:    General: Skin is warm and dry.  Neurological:     General: No focal deficit present.     Mental Status: She is alert.  Psychiatric:        Mood and Affect: Mood normal.         Behavior: Behavior normal.      Labs on Admission:  I have personally reviewed following labs and imaging studies Results for orders placed or performed during the hospital encounter of 12/06/22 (from the past 24 hour(s))  Comprehensive metabolic panel     Status: Abnormal   Collection Time: 12/06/22  3:44 PM  Result Value Ref Range   Sodium 140 135 - 145 mmol/L   Potassium 3.7 3.5 - 5.1 mmol/L   Chloride 103 98 - 111 mmol/L   CO2 30 22 - 32 mmol/L   Glucose, Bld 99 70 - 99 mg/dL   BUN 12 6 - 20 mg/dL   Creatinine, Ser 0.76 0.44 - 1.00 mg/dL   Calcium 8.4 (L) 8.9 - 10.3 mg/dL   Total Protein 7.0 6.5 - 8.1 g/dL   Albumin 3.6 3.5 - 5.0 g/dL   AST 16 15 - 41 U/L   ALT 19 0 - 44 U/L   Alkaline Phosphatase 67 38 - 126 U/L   Total Bilirubin 1.1 0.3 - 1.2 mg/dL   GFR, Estimated >60 >60 mL/min   Anion gap 7 5 - 15  CBC     Status: Abnormal   Collection Time: 12/06/22  3:44 PM  Result Value Ref Range   WBC 14.5 (H) 4.0 - 10.5 K/uL   RBC 5.11 3.87 - 5.11 MIL/uL   Hemoglobin 15.2 (H) 12.0 - 15.0 g/dL   HCT 49.2 (H) 36.0 - 46.0 %   MCV 96.3 80.0 - 100.0 fL   MCH 29.7 26.0 - 34.0 pg   MCHC 30.9 30.0 - 36.0 g/dL   RDW 13.0 11.5 - 15.5 %   Platelets 200 150 - 400 K/uL  nRBC 0.0 0.0 - 0.2 %  Brain natriuretic peptide     Status: None   Collection Time: 12/06/22  3:44 PM  Result Value Ref Range   B Natriuretic Peptide 33.7 0.0 - 100.0 pg/mL  Resp panel by RT-PCR (RSV, Flu A&B, Covid) Anterior Nasal Swab     Status: Abnormal   Collection Time: 12/06/22  4:10 PM   Specimen: Anterior Nasal Swab  Result Value Ref Range   SARS Coronavirus 2 by RT PCR POSITIVE (A) NEGATIVE   Influenza A by PCR NEGATIVE NEGATIVE   Influenza B by PCR NEGATIVE NEGATIVE   Resp Syncytial Virus by PCR NEGATIVE NEGATIVE  Blood gas, venous     Status: Abnormal   Collection Time: 12/06/22  4:10 PM  Result Value Ref Range   pH, Ven 7.38 7.25 - 7.43   pCO2, Ven 64 (H) 44 - 60 mmHg   pO2, Ven 53 (H) 32 - 45  mmHg   Bicarbonate 37.9 (H) 20.0 - 28.0 mmol/L   Acid-Base Excess 10.2 (H) 0.0 - 2.0 mmol/L   O2 Saturation 88 %   Patient temperature 37.0   I-Stat beta hCG blood, ED     Status: None   Collection Time: 12/06/22  4:26 PM  Result Value Ref Range   I-stat hCG, quantitative <5.0 <5 mIU/mL   Comment 3             Radiological Exams on Admission: DG Chest Port 1 View  Result Date: 12/06/2022 CLINICAL DATA:  Shortness of breath and wheezing. EXAM: PORTABLE CHEST 1 VIEW COMPARISON:  08/15/2022 FINDINGS: The cardiomediastinal silhouette is unremarkable. There is no evidence of focal airspace disease, pulmonary edema, suspicious pulmonary nodule/mass, pleural effusion, or pneumothorax. No acute bony abnormalities are identified. IMPRESSION: No active disease. Electronically Signed   By: Margarette Canada M.D.   On: 12/06/2022 16:37   DG Chest [facility]  Final Result      Consults called:  N/a  EKG: Independently reviewed. Sinus tach, rate 114   Dwyane Dee, MD [facility] 12/06/2022, 7:34 PM

## 2022-12-06 NOTE — Progress Notes (Addendum)
Pt declined cpap tonight.  RT encouraged pt to call should she change her mind.  RN notified.  Pt stated she wears her home cpap maybe once or twice a week.

## 2022-12-06 NOTE — ED Notes (Signed)
Admitting MD at [facility].  

## 2022-12-06 NOTE — Assessment & Plan Note (Signed)
-   Suspected exacerbation in setting of COVID-19 infection - See COVID-19 treatment as well - Continue steroids, DuoNebs, oxygen

## 2022-12-06 NOTE — ED Provider Notes (Signed)
[place] EMERGENCY DEPARTMENT AT [facility] Provider Note   CSN: [id number] Arrival date & time: 12/06/22  1538     History  Chief Complaint  Patient presents with   Shortness of Breath    April Ellis is a 47 y.o. female.  47 year old female with a history of asthma/COPD complicated by respiratory arrest, heart failure with EF of 35%, morbid obesity presents emergency department with shortness of breath.  Over the past several days patient reports she has had a cough and cold-like symptoms.  Says the past 24 hours her shortness of breath has worsened so she decided come into the emergency department for evaluation.  Also has had lower extremity swelling that is worsened recently.  Denies any fevers or chest pain.  Was found to be satting in the 24s in triage and was brought back immediately to the room.       Home Medications Prior to Admission medications   Medication Sig Start Date End Date Taking? Authorizing Provider  albuterol (VENTOLIN HFA) 108 (90 Base) MCG/ACT inhaler Inhale 2 puffs every 4-6 hours as needed 11/26/22   Baird Lyons D, MD  azithromycin (ZITHROMAX) 250 MG tablet Take 2 tablets on day 1, then 1 tablet daily on days 2 through 5 12/06/22 12/11/22  Mar Daring, PA-C  bisoprolol (ZEBETA) 5 MG tablet Take 2.5 mg by mouth daily. 10/04/22   [provider]  buPROPion (WELLBUTRIN XL) 300 MG 24 hr tablet TAKE 1 TABLET BY MOUTH EVERY DAY IN THE MORNING 11/02/22   [name], FNP  cetirizine (ZYRTEC) 10 MG tablet Take 10 mg by mouth daily as needed for allergies or rhinitis.    [provider]  diphenhydrAMINE (BENADRYL) 25 mg capsule Take 25 mg by mouth every 8 (eight) hours as needed for allergies.    [provider]  empagliflozin (JARDIANCE) 10 MG TABS tablet Take 1 tablet (10 mg total) by mouth daily. 06/16/22   Joette Catching, PA-C  ENTRESTO 24-26 MG Take 1 tablet by mouth 2 (two) times daily. 09/06/22    [provider]  EPINEPHrine 0.3 mg/0.3 mL IJ SOAJ injection Inject 0.3 mg into the muscle as needed for anaphylaxis. 11/26/22   Deneise Lever, MD  Fluticasone-Umeclidin-Vilant (TRELEGY ELLIPTA) 200-62.5-25 MCG/ACT AEPB Inhale 1 puff then rinse mouth, once daily 10/28/22   Baird Lyons D, MD  furosemide (LASIX) 20 MG tablet Take 2 tablets (40 mg total) by mouth daily. 11/14/22   Lorella Nimrod, MD  guaiFENesin (MUCINEX) 600 MG 12 hr tablet Take 600 mg by mouth 2 (two) times daily. 10/30/22   [provider]  guaiFENesin-dextromethorphan (ROBITUSSIN DM) 100-10 MG/5ML syrup Take 5 mLs by mouth every 4 (four) hours as needed for cough. 11/14/22   Lorella Nimrod, MD  ipratropium-albuterol (DUONEB) 0.5-2.5 (3) MG/3ML SOLN Take 3 mLs by nebulization every 4 (four) hours as needed (for wheezing and shortness of breath). 10/01/22   [provider]  JARDIANCE 10 MG TABS tablet Take 10 mg by mouth daily. 09/05/22   [provider]  montelukast (SINGULAIR) 10 MG tablet Take 1 tablet (10 mg total) by mouth at bedtime. TAKE 1 TABLET BY MOUTH EVERYDAY AT BEDTIME Strength: 10 mg 04/21/22   [name], FNP  nicotine (NICODERM CQ - DOSED IN MG/24 HOURS) 14 mg/24hr patch Place 1 patch (14 mg total) onto the skin daily. 10/28/22 10/28/23  Baird Lyons D, MD  nicotine polacrilex (NICORETTE) 4 MG lozenge Take 4 mg by mouth  as needed for smoking cessation.    [provider]  predniSONE (DELTASONE) 20 MG tablet Take 2 tablets (40 mg total) by mouth daily with breakfast. 12/06/22   Burnette, Clearnce Sorrel, PA-C  sacubitril-valsartan (ENTRESTO) 24-26 MG Take 1 tablet by mouth 2 (two) times daily. Patient not taking: Reported on 11/26/2022 06/12/22   Rai, Vernelle Emerald, MD  Semaglutide-Weight Management (WEGOVY) 0.25 MG/0.5ML SOAJ Inject 0.25 mg into the skin once a week. 11/02/22   [name], FNP  spironolactone (ALDACTONE) 25 MG tablet Take 12.5 mg by mouth daily. 10/16/22   [provider]      Allergies    Penicillins and Pineapple    Review of Systems   Review of Systems  Physical Exam Updated Vital Signs BP (!) 141/108   Pulse (!) 111   Temp (!) 97.4 F (36.3 C) (Oral)   Resp 15   Ht '5\' 1"'$  (1.549 m)   Wt (!) 148 kg   SpO2 100%   BMI 61.65 kg/m  Physical Exam Vitals and nursing note reviewed.  Constitutional:      General: She is not in acute distress.    Appearance: She is well-developed.     Comments: Speaking in short sentences but tachypneic with visible accessory muscle use  HENT:     Head: Normocephalic and atraumatic.     Right Ear: External ear normal.     Left Ear: External ear normal.     Nose: Nose normal.  Eyes:     Extraocular Movements: Extraocular movements intact.     Conjunctiva/sclera: Conjunctivae normal.     Pupils: Pupils are equal, round, and reactive to light.  Cardiovascular:     Rate and Rhythm: Regular rhythm. Tachycardia present.     Heart sounds: No murmur heard. Pulmonary:     Effort: Respiratory distress present.     Breath sounds: Wheezing present.     Comments: Initially nonrebreather and weaned to 4 L nasal cannula. Musculoskeletal:     Cervical back: Normal range of motion and neck supple.     Right lower leg: Edema present.     Left lower leg: Edema present.  Skin:    General: Skin is warm and dry.  Neurological:     Mental Status: She is alert and oriented to person, place, and time. Mental status is at baseline.  Psychiatric:        Mood and Affect: Mood normal.     ED Results / Procedures / Treatments   Labs (all labs ordered are listed, but only abnormal results are displayed) Labs Reviewed  COMPREHENSIVE METABOLIC PANEL - Abnormal; Notable for the following components:      Result Value   Calcium 8.4 (*)    All other components within normal limits  CBC - Abnormal; Notable for the following components:   WBC 14.5 (*)    Hemoglobin 15.2 (*)    HCT 49.2 (*)    All other components  within normal limits  BLOOD GAS, VENOUS - Abnormal; Notable for the following components:   pCO2, Ven 64 (*)    pO2, Ven 53 (*)    Bicarbonate 37.9 (*)    Acid-Base Excess 10.2 (*)    All other components within normal limits  RESP PANEL BY RT-PCR (RSV, FLU A&B, COVID)  RVPGX2  BRAIN NATRIURETIC PEPTIDE  I-STAT BETA HCG BLOOD, ED ([facility], [facility], [facility] ONLY)    EKG EKG Interpretation  Date/Time:  Monday December 06 2022 15:56:53 EDT Ventricular  Rate:  114 PR Interval:  137 QRS Duration: 88 QT Interval:  330 QTC Calculation: 455 R Axis:   73 Text Interpretation: Sinus tachycardia Consider right atrial enlargement Low voltage, precordial leads Consider anterior infarct Minimal ST depression, inferior leads Confirmed by Margaretmary Eddy [phone]) on 12/06/2022 3:59:50 PM  Radiology DG Chest Port 1 View  Result Date: 12/06/2022 CLINICAL DATA:  Shortness of breath and wheezing. EXAM: PORTABLE CHEST 1 VIEW COMPARISON:  08/15/2022 FINDINGS: The cardiomediastinal silhouette is unremarkable. There is no evidence of focal airspace disease, pulmonary edema, suspicious pulmonary nodule/mass, pleural effusion, or pneumothorax. No acute bony abnormalities are identified. IMPRESSION: No active disease. Electronically Signed   By: Margarette Canada M.D.   On: 12/06/2022 16:37    Procedures Procedures   Medications Ordered in ED Medications  albuterol (PROVENTIL) (2.5 MG/3ML) 0.083% nebulizer solution (  Not Given 12/06/22 1602)  ipratropium-albuterol (DUONEB) 0.5-2.5 (3) MG/3ML nebulizer solution (  Not Given 12/06/22 1602)  magnesium sulfate IVPB 2 g 50 mL (has no administration in time range)  ipratropium-albuterol (DUONEB) 0.5-2.5 (3) MG/3ML nebulizer solution 3 mL (3 mLs Nebulization Given 12/06/22 1600)  albuterol (PROVENTIL) (2.5 MG/3ML) 0.083% nebulizer solution 7.5 mg (7.5 mg Nebulization Given 12/06/22 1601)  methylPREDNISolone sodium succinate (SOLU-MEDROL) 125 mg/2 mL injection 125 mg (125 mg Intravenous  Given 12/06/22 1612)  lactated ringers bolus 1,000 mL (1,000 mLs Intravenous New Bag/Given 12/06/22 1653)    ED Course/ Medical Decision Making/ A&P Clinical Course as of 12/06/22 1700  Mon Dec 06, 2022  1654 Patient reassessed.  Says she is feeling better after the breathing treatment.  Still has mild diffuse wheezing.  Will attempt to space patient to the floor.  Signed out to Dr Pearline Cables.  [RP]    Clinical Course User Index [RP] Fransico Meadow, MD                             Medical Decision Making Amount and/or Complexity of Data Reviewed Labs: ordered. Radiology: ordered.  Risk Prescription drug management. Decision regarding hospitalization.   April Ellis is a 47 y.o. female with comorbidities that complicate the patient evaluation including asthma/COPD complicated by respiratory arrest and heart failure presents emergency department with URI symptoms, cough, shortness of breath, and lower extremity swelling  Initial Ddx:  COPD exacerbation, heart failure exacerbation, URI, pneumonia  MDM:  Feel the patient is likely having a COPD/asthma exacerbation at this time.  Will give her nebulizers and steroids.  Will check COVID and flu and chest x-ray for underlying infection as well as BNP for possible coexisting heart failure exacerbation.  Will monitor closely and feel that she would likely require admission for monitoring given her history.  Plan:  Labs BNP Chest x-ray EKG DuoNebs and continuous albuterol Solu-Medrol  ED Summary/Re-evaluation:  Patient underwent the above workup did not show signs of heart failure.  Was feeling better after initial breathing treatment.  Signed out to the oncoming physician awaiting repeat evaluation.  This patient presents to the ED for concern of complaints listed in HPI, this involves an extensive number of treatment options, and is a complaint that carries with it a high risk of complications and morbidity. Disposition including  potential need for admission considered.   Dispo: Pending remainder of workup  Records reviewed Outpatient Clinic Notes The following labs were independently interpreted: Chemistry and show no acute abnormality I independently reviewed the following imaging with scope of interpretation limited  to determining acute life threatening conditions related to emergency care: Chest x-ray and agree with the radiologist interpretation with the following exceptions: none I personally reviewed and interpreted cardiac monitoring: normal sinus rhythm  I personally reviewed and interpreted the pt's EKG: see above for interpretation  I have reviewed the patients home medications and made adjustments as needed  Final Clinical Impression(s) / ED Diagnoses Final diagnoses:  Mild asthma with exacerbation, unspecified whether persistent  Hypoxia    Rx / DC Orders ED Discharge Orders     None         Fransico Meadow, MD 12/06/22 1700

## 2022-12-06 NOTE — Assessment & Plan Note (Addendum)
-   No S/S exacerbation - BNP not elevated and CXR negative for signs of pulmonary edema/effusion -Prior Lasix dose was increased to 40 mg daily after February hospitalization - Continue home dosing of Lasix

## 2022-12-06 NOTE — Assessment & Plan Note (Signed)
-   Follows outpatient with pulmonology, recently seen on 11/26/2022, Dr. Annamaria Boots - Tentative plan for trying to initiate Xolair if able - See further workup above

## 2022-12-06 NOTE — Progress Notes (Signed)
Virtual Visit Consent   April Ellis, you are scheduled for a virtual visit with a [facility] provider today. Just as with appointments in the office, your consent must be obtained to participate. Your consent will be active for this visit and any virtual visit you may have with one of our providers in the next 365 days. If you have a MyChart account, a copy of this consent can be sent to you electronically.  As this is a virtual visit, video technology does not allow for your provider to perform a traditional examination. This may limit your provider's ability to fully assess your condition. If your provider identifies any concerns that need to be evaluated in person or the need to arrange testing (such as labs, EKG, etc.), we will make arrangements to do so. Although advances in technology are sophisticated, we cannot ensure that it will always work on either your end or our end. If the connection with a video visit is poor, the visit may have to be switched to a telephone visit. With either a video or telephone visit, we are not always able to ensure that we have a secure connection.  By engaging in this virtual visit, you consent to the provision of healthcare and authorize for your insurance to be billed (if applicable) for the services provided during this visit. Depending on your insurance coverage, you may receive a charge related to this service.  I need to obtain your verbal consent now. Are you willing to proceed with your visit today? April Ellis has provided verbal consent on 12/06/2022 for a virtual visit (video or telephone). Mar Daring, PA-C  Date: 12/06/2022 1:42 PM  Virtual Visit via Video Note   I, Mar Daring, connected with  April Ellis  ([id number], 03-08-1976) on 12/06/22 at  1:30 PM EDT by a video-enabled telemedicine application and verified that I am speaking with the correct person using two identifiers.  Location: Patient: Virtual Visit Location  Patient: Home Provider: Virtual Visit Location Provider: Home Office   I discussed the limitations of evaluation and management by telemedicine and the availability of in person appointments. The patient expressed understanding and agreed to proceed.    History of Present Illness: April Ellis is a 47 y.o. who identifies as a female who was assigned female at birth, and is being seen today for asthma exacerbation.  HPI: URI  This is a new problem. The current episode started in the past 7 days (URI symptoms started a few days ago and worsened yesterday). The problem has been gradually worsening. There has been no fever. Associated symptoms include congestion, headaches and wheezing. Pertinent negatives include no coughing, diarrhea, ear pain, nausea, plugged ear sensation, rhinorrhea, sinus pain, sore throat or vomiting. Associated symptoms comments: SOB, mouth feels dry. She has tried inhaler use (albuterol, nebulizer, trelegy, prednisone tapers recently, mucinex) for the symptoms. The treatment provided mild relief.    Had improved completely from previous symptoms per patient.  Was hospitalized on 11/12/22 for LOC that was suspected to have been from respiratory distress from asthma or an allergic reaction, unsure which. She had dosed one injection of Epi-Pen prior to LOC due to SOB and mouth itching. She was hospitalized for 2 days and discharged with 3 day Prednisone '40mg'$ . Saw PCP on 11/22/22 and still having SOB and wheezing. Prednisone '40mg'$  was continued for another 3 days. Through all this she has also been using her Albuterol inhaler, Duoneb nebulizer treatments, and her  Trelegy inhaler. She then saw her Pulmonologist on 11/26/22 and given an 8 day taper of prednisone. Reports complete resolution of symptoms until this started a few days ago (this means URI symptoms started day of prednisone completion).    Problems: Patient Active Problem List   Diagnosis Date Noted   Light cigarette  smoker 11/19/2022   Asthma 11/13/2022   Chronic HFrEF (heart failure with reduced ejection fraction) ([place]) 11/13/2022   Syncope 11/13/2022   Anemia [id number]   Diastolic heart failure ([place]) 11/01/2022   Family history of malignant neoplasm of digestive organs 11/01/2022   Asthma with COPD with exacerbation ([place]) 08/15/2022   Chronic combined systolic and diastolic CHF (congestive heart failure) ([place]) 08/15/2022   Polycythemia 08/15/2022   Essential hypertension 08/15/2022   Depression 08/15/2022   DCM (dilated cardiomyopathy) ([place])    Acute combined systolic and diastolic heart failure ([place])    Leg swelling 06/08/2022   Neuritis of right ulnar nerve 03/18/2021   Thrombocytosis 04/04/2020   Obstructive sleep apnea 06/13/2019   Nocturnal hypoxemia 06/13/2019   Daytime somnolence 04/04/2019   Dyspnea 02/15/2019   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   Irregular periods 12/22/2016   Allergic reactions 08/16/2016   Chronic rhinitis 08/16/2016   Asthma, chronic, unspecified asthma severity, with acute exacerbation 09/30/2014   Leukocytosis 09/30/2014   Iron deficiency anemia 08/14/2014   History of tobacco abuse 08/14/2014   Atypical chest pain 06/27/2014   Tobacco abuse 05/12/2014   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 UNK Did it involve sudden or severe rash/hives, skin peeling, or any reaction on the inside of your mouth or nose? UNK Did you need to seek medical attention at a hospital or doctor's office? UNK When did it last happen?      more than 10 years If all above answers are "NO", may proceed with  cephalosporin use.    Pineapple Other (See Comments)    Tested allergic to this   Medications:  Current Outpatient Medications:    azithromycin (ZITHROMAX) 250 MG tablet, Take 2 tablets on day 1, then 1 tablet daily on days 2 through 5, Disp: 6 tablet, Rfl: 0   predniSONE (DELTASONE) 20 MG tablet, Take 2 tablets (40 mg total) by mouth daily with breakfast., Disp: 10 tablet, Rfl: 0   albuterol (VENTOLIN HFA) 108 (90 Base) MCG/ACT inhaler, Inhale 2 puffs every 4-6 hours as needed, Disp: 18 g, Rfl: 12   bisoprolol (ZEBETA) 5 MG tablet, Take 2.5 mg by mouth daily., Disp: , Rfl:    buPROPion (WELLBUTRIN XL) 300 MG 24 hr tablet, TAKE 1 TABLET BY MOUTH EVERY DAY IN THE MORNING, Disp: 90 tablet, Rfl: 1   cetirizine (ZYRTEC) 10 MG tablet, Take 10 mg by mouth daily as needed for allergies or rhinitis., Disp: , Rfl:    diphenhydrAMINE (BENADRYL) 25 mg capsule, Take 25 mg by mouth every 8 (eight) hours as needed for allergies., Disp: , Rfl:    empagliflozin (JARDIANCE) 10 MG TABS tablet, Take 1 tablet (10 mg total) by mouth daily., Disp: 30 tablet, Rfl: 3   ENTRESTO 24-26 MG, Take 1 tablet by  mouth 2 (two) times daily., Disp: , Rfl:    EPINEPHrine 0.3 mg/0.3 mL IJ SOAJ injection, Inject 0.3 mg into the muscle as needed for anaphylaxis., Disp: 2 each, Rfl: 5   Fluticasone-Umeclidin-Vilant (TRELEGY ELLIPTA) 200-62.5-25 MCG/ACT AEPB, Inhale 1 puff then rinse mouth, once daily, Disp: 60 each, Rfl: 12   furosemide (LASIX) 20 MG tablet, Take 2 tablets (40 mg total) by mouth daily., Disp: 30 tablet, Rfl: 1   guaiFENesin (MUCINEX) 600 MG 12 hr tablet, Take 600 mg by mouth 2 (two) times daily., Disp: , Rfl:    guaiFENesin-dextromethorphan (ROBITUSSIN DM) 100-10 MG/5ML syrup, Take 5 mLs by mouth every 4 (four) hours as needed for cough., Disp: 118 mL, Rfl: 0   ipratropium-albuterol (DUONEB) 0.5-2.5 (3) MG/3ML SOLN, Take 3 mLs by nebulization every 4 (four) hours as needed (for wheezing and shortness of breath).,  Disp: , Rfl:    JARDIANCE 10 MG TABS tablet, Take 10 mg by mouth daily., Disp: , Rfl:    montelukast (SINGULAIR) 10 MG tablet, Take 1 tablet (10 mg total) by mouth at bedtime. TAKE 1 TABLET BY MOUTH EVERYDAY AT BEDTIME Strength: 10 mg, Disp: 90 tablet, Rfl: 1   nicotine (NICODERM CQ - DOSED IN MG/24 HOURS) 14 mg/24hr patch, Place 1 patch (14 mg total) onto the skin daily., Disp: 30 patch, Rfl: 5   nicotine polacrilex (NICORETTE) 4 MG lozenge, Take 4 mg by mouth as needed for smoking cessation., Disp: , Rfl:    sacubitril-valsartan (ENTRESTO) 24-26 MG, Take 1 tablet by mouth 2 (two) times daily. (Patient not taking: Reported on 11/26/2022), Disp: 60 tablet, Rfl: 3   Semaglutide-Weight Management (WEGOVY) 0.25 MG/0.5ML SOAJ, Inject 0.25 mg into the skin once a week., Disp: 2 mL, Rfl: 0   spironolactone (ALDACTONE) 25 MG tablet, Take 12.5 mg by mouth daily., Disp: , Rfl:   Observations/Objective: Patient is well-developed, well-nourished in no acute distress.  Resting comfortably at home.  Head is normocephalic, atraumatic.  No labored breathing.  Speech is clear and coherent with logical content.  Patient is alert and oriented at baseline.    Assessment and Plan: 1. Severe persistent asthma with exacerbation - azithromycin (ZITHROMAX) 250 MG tablet; Take 2 tablets on day 1, then 1 tablet daily on days 2 through 5  Dispense: 6 tablet; Refill: 0 - predniSONE (DELTASONE) 20 MG tablet; Take 2 tablets (40 mg total) by mouth daily with breakfast.  Dispense: 10 tablet; Refill: 0  - Had complete resolution per patient and then recurrence on last day of prednisone taper - Will add Zpack for possible second sickening since had symptom improvement then worsening recurrence - Prednisone '40mg'$  x 5 days - Continue home inhalers and nebulizer treatments as needed - Advised to contact Pulmonologist today to advise of illness and see if prednisone is appropriate before starting, she agrees - Seek in person  evaluation if symptoms worsen or fail to improve  Follow Up Instructions: I discussed the assessment and treatment plan with the patient. The patient was provided an opportunity to ask questions and all were answered. The patient agreed with the plan and demonstrated an understanding of the instructions.  A copy of instructions were sent to the patient via MyChart unless otherwise noted below.    The patient was advised to call back or seek an in-person evaluation if the symptoms worsen or if the condition fails to improve as anticipated.  Time:  I spent 15 minutes with the patient via telehealth technology discussing the above  problems/concerns.    Mar Daring, PA-C

## 2022-12-06 NOTE — Hospital Course (Addendum)
Ms. Barbie Banner is a 47 yo female with PMH COPD, asthma, OSA, morbid obesity, chronic combined systolic/diastolic CHF, HTN, depression who presented to the ER with worsening shortness of breath. She also had had an outpatient virtual visit today for similar symptoms.  She has had symptoms which started 2-3 days ago although due to being on prolonged course of prednisone outpatient, she is unclear when her last taper finished but appears to have finished around the 8th or 9th of March after seen by Dr. Annamaria Boots on 3/1 and re-started on prednisone x 8 days.  She had resolution of symptoms from prior hospitalization (see below).   Today after being seen by virtual visit, she was started on azithromycin and prednisone; she took 1 dose of prednisone 40 mg and used her inhaler before presenting to the ER. On arrival she was found to be hypoxic, 68% on room air, and placed on 15 L NRB.  She had rapid improvement after being treated with nebulizers, magnesium, and Solu-Medrol.  She was able to be weaned down to 2 L oxygen in the ER with overall improvement in respiratory distress which was present on arrival.  Recent hospitalization from 2/16 - 2/18 for asthma exacerbation with syncope from presumed respiratory distress.  She was treated with nonrebreather, steroids, and Lasix dose was also increased prior to discharge.  She followed up outpatient on 11/22/2022 with lingering symptoms and was continued on prednisone course for 3 more days. She has also since been seen by pulmonology on 11/26/2022.  She was continued on Trelegy and smoking cessation was again emphasized.  She also was continued on prednisone taper. She has had elevated IgE noted and has been trying to get on MAB per pulmonology.  Xolair application appears yet to be completed and seems to be being pursued outpatient per last pulmonology note 3/1.   In context of COVID-19 infection, patient is admitted for treatment and at least monitoring overnight.

## 2022-12-06 NOTE — Assessment & Plan Note (Signed)
-   Continue Wellbutrin 

## 2022-12-06 NOTE — ED Notes (Signed)
EDP, and RT at [facility]

## 2022-12-06 NOTE — Assessment & Plan Note (Signed)
-   Complicates overall prognosis and care - Body mass index is 61.65 kg/m.

## 2022-12-06 NOTE — ED Notes (Signed)
EDP at [facility], CAT neb in progress, xray at [facility]

## 2022-12-06 NOTE — Patient Instructions (Signed)
April Ellis, thank you for joining Mar Daring, PA-C for today's virtual visit.  While this provider is not your primary care provider (PCP), if your PCP is located in our provider database this encounter information will be shared with them immediately following your visit.   [facility] account gives you access to today's visit and all your visits, tests, and labs performed at [facility] " click here if you don't have a [facility] account or go to [email or web address]  Consent: (Patient) April Ellis provided verbal consent for this virtual visit at the beginning of the encounter.  Current Medications:  Current Outpatient Medications:    azithromycin (ZITHROMAX) 250 MG tablet, Take 2 tablets on day 1, then 1 tablet daily on days 2 through 5, Disp: 6 tablet, Rfl: 0   predniSONE (DELTASONE) 20 MG tablet, Take 2 tablets (40 mg total) by mouth daily with breakfast., Disp: 10 tablet, Rfl: 0   albuterol (VENTOLIN HFA) 108 (90 Base) MCG/ACT inhaler, Inhale 2 puffs every 4-6 hours as needed, Disp: 18 g, Rfl: 12   bisoprolol (ZEBETA) 5 MG tablet, Take 2.5 mg by mouth daily., Disp: , Rfl:    buPROPion (WELLBUTRIN XL) 300 MG 24 hr tablet, TAKE 1 TABLET BY MOUTH EVERY DAY IN THE MORNING, Disp: 90 tablet, Rfl: 1   cetirizine (ZYRTEC) 10 MG tablet, Take 10 mg by mouth daily as needed for allergies or rhinitis., Disp: , Rfl:    diphenhydrAMINE (BENADRYL) 25 mg capsule, Take 25 mg by mouth every 8 (eight) hours as needed for allergies., Disp: , Rfl:    empagliflozin (JARDIANCE) 10 MG TABS tablet, Take 1 tablet (10 mg total) by mouth daily., Disp: 30 tablet, Rfl: 3   ENTRESTO 24-26 MG, Take 1 tablet by mouth 2 (two) times daily., Disp: , Rfl:    EPINEPHrine 0.3 mg/0.3 mL IJ SOAJ injection, Inject 0.3 mg into the muscle as needed for anaphylaxis., Disp: 2 each, Rfl: 5   Fluticasone-Umeclidin-Vilant (TRELEGY ELLIPTA) 200-62.5-25 MCG/ACT AEPB, Inhale 1 puff  then rinse mouth, once daily, Disp: 60 each, Rfl: 12   furosemide (LASIX) 20 MG tablet, Take 2 tablets (40 mg total) by mouth daily., Disp: 30 tablet, Rfl: 1   guaiFENesin (MUCINEX) 600 MG 12 hr tablet, Take 600 mg by mouth 2 (two) times daily., Disp: , Rfl:    guaiFENesin-dextromethorphan (ROBITUSSIN DM) 100-10 MG/5ML syrup, Take 5 mLs by mouth every 4 (four) hours as needed for cough., Disp: 118 mL, Rfl: 0   ipratropium-albuterol (DUONEB) 0.5-2.5 (3) MG/3ML SOLN, Take 3 mLs by nebulization every 4 (four) hours as needed (for wheezing and shortness of breath)., Disp: , Rfl:    JARDIANCE 10 MG TABS tablet, Take 10 mg by mouth daily., Disp: , Rfl:    montelukast (SINGULAIR) 10 MG tablet, Take 1 tablet (10 mg total) by mouth at bedtime. TAKE 1 TABLET BY MOUTH EVERYDAY AT BEDTIME Strength: 10 mg, Disp: 90 tablet, Rfl: 1   nicotine (NICODERM CQ - DOSED IN MG/24 HOURS) 14 mg/24hr patch, Place 1 patch (14 mg total) onto the skin daily., Disp: 30 patch, Rfl: 5   nicotine polacrilex (NICORETTE) 4 MG lozenge, Take 4 mg by mouth as needed for smoking cessation., Disp: , Rfl:    sacubitril-valsartan (ENTRESTO) 24-26 MG, Take 1 tablet by mouth 2 (two) times daily. (Patient not taking: Reported on 11/26/2022), Disp: 60 tablet, Rfl: 3   Semaglutide-Weight Management (WEGOVY) 0.25 MG/0.5ML SOAJ, Inject 0.25 mg into the skin  once a week., Disp: 2 mL, Rfl: 0   spironolactone (ALDACTONE) 25 MG tablet, Take 12.5 mg by mouth daily., Disp: , Rfl:    Medications ordered in this encounter:  Meds ordered this encounter  Medications   azithromycin (ZITHROMAX) 250 MG tablet    Sig: Take 2 tablets on day 1, then 1 tablet daily on days 2 through 5    Dispense:  6 tablet    Refill:  0    Order Specific Question:   Supervising Provider    Answer:   Chase Picket [id number]   predniSONE (DELTASONE) 20 MG tablet    Sig: Take 2 tablets (40 mg total) by mouth daily with breakfast.    Dispense:  10 tablet    Refill:  0     Order Specific Question:   Supervising Provider    Answer:   Chase Picket [id number]     *If you need refills on other medications prior to your next appointment, please contact your pharmacy*  Follow-Up: Call back or seek an in-person evaluation if the symptoms worsen or if the condition fails to improve as anticipated.  [place] [phone]  Other Instructions Asthma Attack  Asthma attack, also called asthma flare or acute bronchospasm, is the sudden narrowing and tightening of the lower airways (bronchi) in the lungs, that can make it hard to breathe. The narrowing is caused by inflammation and tightening of the smooth muscle that wraps around the lower airways in the lungs. Asthma attacks may cause coughing, high-pitched whistling sounds when you breathe, most often when you breathe out (wheezing), trouble breathing (shortness of breath), and chest pain. The airways may produce extra mucus caused by the inflammation and irritation. During an asthma attack, it can be difficult to breathe. It is important to get treatment right away. Asthma attacks can range from minor to life-threatening. What are the causes? Possible causes or triggers of this condition include: Household allergens like dust, pet dander, and cockroaches. Mold and pollen from trees or grass. Air pollutants such as household cleaners, aerosol sprays, strong odors, and smoke of any kind. Weather changes and cold air. Stress or strong emotions such as crying or laughing hard. Exercise or activity that requires a lot of energy. Certain medicines or medical conditions such as: Aspirin or beta-blockers. Infections or inflammatory conditions, such as a flu (influenza), a cold, pneumonia, or inflammation of the nasal membranes (rhinitis). Gastroesophageal reflux disease (GERD). GERD is a condition in which stomach acid backs up into your esophagus and spills into your trachea (windpipe), which can  irritate your airways. What are the signs or symptoms? Symptoms of this condition include: Wheezing. Excessive coughing. This may only happen at night. Chest tightness or pain. Shortness of breath. Difficulty talking in complete sentences. Feeling like you cannot get enough air, no matter how hard you breathe (air hunger). How is this diagnosed? This condition may be diagnosed based on: A physical exam and your medical history. Your symptoms. Tests to check for other causes of your symptoms or other conditions that may have triggered your asthma attack. These tests may include: A chest X-ray. Blood tests. Lung function studies (spirometry) to evaluate the flow of air in your lungs. How is this treated? Treatment for this condition depends on the severity and cause of your asthma attack. For mild attacks, you may receive medicines through a hand-held inhaler (metered dose inhaler or MDI) or through a device that turns liquid medicine into a  mist (nebulizer). These medicines include: Quick relief or rescue medicines that quickly relax the airways and lungs. Long-acting medicines that are used daily to prevent (control) your asthma symptoms. For moderate or severe attacks, you may be treated with steroid medicines by mouth or through an IV injection at the hospital. For severe attacks, you may need oxygen therapy or a breathing machine (ventilator). If your asthma attack was caused by an infection from bacteria, you will be given antibiotic medicines. Follow these instructions at home: Medicines Take over-the-counter and prescription medicines only as told by your health care provider. If you were prescribed an antibiotic medicine, take it as told by your health care provider. Do not stop using the antibiotic even if you start to feel better. Tell your doctor if you are pregnant or may be pregnant to make sure your asthma medicine is safe to use during pregnancy. Avoiding triggers  Keep  track of things that trigger your asthma attacks. Avoid exposure to these triggers. Do not use any products that contain nicotine or tobacco. These products include cigarettes, chewing tobacco, and vaping devices, such as e-cigarettes. If you need help quitting, ask your health care provider. When there is a lot of pollen, air pollution, or humidity, keep windows closed and use an air conditioner or go to places with air conditioning. Asthma action plan Work with your health care provider to make a written plan for managing and treating your asthma attacks (asthma action plan). This plan should include: A list of your asthma triggers and how to avoid them. A list of symptoms that you may have during an asthma attack. Information about which medicine to take, when to take the medicine, and how much of the medicine to take. Information to help you understand your peak flow measurements. Daily actions that you can take to control your asthma symptoms. Contact information for your health care providers. If you have an asthma attack, act quickly. Follow the emergency steps on your written asthma action plan. This may prevent you from needing to go to the hospital. Talk to a family member or close friend about your asthma action plan and who to contact in case you need help. General instructions Avoid excessive exercise or activity until your asthma attack goes away. Stay up to date on all your vaccines, such as flu and pneumonia vaccines. Keep all follow-up visits. This is important. Contact a health care provider if: You have followed your action plan for 1 hour and your peak flow reading is still at 50-79% of your personal best. This is in the yellow zone, which means "caution." You need to use your quick reliever medicine more frequently than normal. Your medicines are causing side effects, such as rash, itching, swelling, or trouble breathing. Your symptoms do not improve after taking  medicine. You have a fever. Get help right away if: Your peak flow reading is less than 50% of your personal best. This is in the red zone, which means "danger." You develop chest pain or discomfort. Your medicines no longer seem to be helping. You are coughing up bloody mucus. You have a fever and your symptoms suddenly get worse. You have trouble swallowing. You feel very tired, and breathing becomes tiring. These symptoms may be an emergency. Get help right away. Call 911. Do not wait to see if the symptoms will go away. Do not drive yourself to the hospital. Summary Asthma attacks are caused by narrowing or tightness in air passages, which causes shortness of  breath, coughing, and wheezing. Many things can trigger an asthma attack, such as allergens, weather changes, exercise, strong odors, and smoke of any kind. If you have an asthma attack, act quickly. Follow the emergency steps on your written asthma action plan. Get help right away if you have severe trouble breathing, chest pain, or fever, or if your home medicines are no longer helping with your symptoms. This information is not intended to replace advice given to you by your health care provider. Make sure you discuss any questions you have with your health care provider. Document Revised: 07/05/2021 Document Reviewed: 07/05/2021 Elsevier Patient Education  [place].    If you have been instructed to have an in-person evaluation today at a local Urgent Care facility, please use the link below. It will take you to a list of all of our available [facility], including address, phone number and hours of operation. Please do not delay care.  [facility]  If you or a family member do not have a primary care provider, use the link below to schedule a visit and establish care. When you choose a [facility] primary care physician or advanced practice provider, you gain a long-term partner in  health. Find a Primary Care Provider  Learn more about [facility] in-office and virtual care options: [facility]

## 2022-12-06 NOTE — Assessment & Plan Note (Signed)
-   Initially presented with severe hypoxia, 68% on room air.  Required 15 L NRB in ER, although did have rapid improvement -Currently on 2 L when being admitted - Does continue to still smoke a few cigarettes a day, she was again encouraged for strict cessation -Continue oxygen and wean off as able - Will need walk test prior to discharge also - Continue Decadron

## 2022-12-06 NOTE — Assessment & Plan Note (Signed)
-   BP currently stable - Will await med rec resolution otherwise will resume home meds if needed prior if blood pressure elevates

## 2022-12-06 NOTE — ED Provider Notes (Signed)
  Provider Note MRN:  [id number]  Arrival date & time: 12/06/22    ED Course and Medical Decision Making  Assumed care from Dr Philip Aspen at shift change.  See note from prior team for complete details, in brief:    47 yo female, hx asthma, HF; here with asthma exacerbation. Pulse ox 60% in triage. CAT, steroids. Plan to admit for asthma exacerbation per prior team.   Plan per prior physician finish meds, likely admit  Clinical Course as of 12/06/22 1840  Mon Dec 06, 2022  1654 Patient reassessed.  Says she is feeling better after the breathing treatment.  Still has mild diffuse wheezing.  Will attempt to space patient to the floor.  Signed out to Dr Pearline Cables.  [RP]  8341 Pt re-assessed, her work of breathing has improved, she still requires 2 L nasal cannula.  On ambient air she will desat to 85% pulse ox and has tachycardia and tachypnea.  Wheezing improved.  She is compliant with home meds. Continue 2 L nasal cannula.  Give mag sulfate.  Found to have Apison, she is vaccinated. Continue steroids. Plan admit.  [SG]    Clinical Course User Index [RP] Fransico Meadow, MD [SG] Jeanell Sparrow, DO    6:40 PM Patient admitted   .Critical Care  Performed by: Jeanell Sparrow, DO Authorized by: Jeanell Sparrow, DO   Critical care provider statement:    Critical care time (minutes):  30   Critical care time was exclusive of:  Separately billable procedures and treating other patients   Critical care was necessary to treat or prevent imminent or life-threatening deterioration of the following conditions:  Respiratory failure   Critical care was time spent personally by me on the following activities:  Development of treatment plan with patient or surrogate, discussions with consultants, evaluation of patient's response to treatment, examination of patient, ordering and review of laboratory studies, ordering and review of radiographic studies, ordering and performing treatments and interventions,  pulse oximetry, re-evaluation of patient's condition, review of old charts and obtaining history from patient or surrogate   Care discussed with: admitting provider     Final Clinical Impressions(s) / ED Diagnoses     ICD-10-CM   1. Hypoxia  R09.02     2. COVID-19  U07.1     3. Acute respiratory failure with hypoxia (HCC)  J96.01     4. Severe persistent asthma with exacerbation  J45.51       ED Discharge Orders     None       Discharge Instructions   None        Jeanell Sparrow, DO 12/06/22 1840

## 2022-12-06 NOTE — Assessment & Plan Note (Signed)
-  Continue nightly CPAP °

## 2022-12-06 NOTE — Telephone Encounter (Signed)
Dupixent copay card activated today:  BIN: [id number] PCN: [id number]: [id number] ID: [id number]  I spoke w patient regarding new start viist but it appears she is having flare up. Had video visit today with family med provider and was prescribed prednisone and Z-pack. When I called her she was on her way to the ED>. Advised that I will follow-up with her when she is feeling better  Knox Saliva, PharmD, MPH, BCPS, CPP Clinical Pharmacist (Rheumatology and Pulmonology)

## 2022-12-06 NOTE — Assessment & Plan Note (Addendum)
-   presented with 2-3 or less of symptoms but has had a lingering course of symptoms since mid-Feb hospitalization though can distinctly note feeling worse just prior to admission - given early symptom onset and severity of symptoms on admission (required NRB 15L and rather prominent respi distress) and underlying co-morbidities, will at least start with remdesivir - if inflammatory markers are reassuring and she continues to have rapid symptom resolution, would be reasonable to de-escalate to Paxlovid on 12/07/2022 and consider possibly even discharging home if able to be weaned off oxygen - also given rapid improvement in ER, will hold off on Baricitinib or tociluzumab unless were to symptomatically worsen or inflammatory markers worsen as well - CT value 26.6

## 2022-12-06 NOTE — ED Triage Notes (Addendum)
Patient has been short of breath all day even at rest. Has asthma. Took '40mg'$  prednisone and used 2 puffs of her inhaler before arrival. Oxygen saturation 68% room air. Patient moved to recess and placed on 15L NRB.

## 2022-12-07 ENCOUNTER — Telehealth (HOSPITAL_COMMUNITY): Payer: Self-pay

## 2022-12-07 DIAGNOSIS — U071 COVID-19: Secondary | ICD-10-CM | POA: Diagnosis not present

## 2022-12-07 LAB — C-REACTIVE PROTEIN: CRP: 1.2 mg/dL — ABNORMAL HIGH (ref <1.0)

## 2022-12-07 LAB — D-DIMER, QUANTITATIVE: D-Dimer, Quant: <0.27 ug/mL-FEU (ref 0.00–0.50)

## 2022-12-07 LAB — CBC WITH DIFFERENTIAL/PLATELET
Abs Immature Granulocytes: 0.22 10*3/uL — ABNORMAL HIGH (ref 0.00–0.07)
Basophils Absolute: 0 10*3/uL (ref 0.0–0.1)
Basophils Relative: 0 %
Eosinophils Absolute: 0 10*3/uL (ref 0.0–0.5)
Eosinophils Relative: 0 %
HCT: 46.1 % — ABNORMAL HIGH (ref 36.0–46.0)
Hemoglobin: 14.4 g/dL (ref 12.0–15.0)
Immature Granulocytes: 2 %
Lymphocytes Relative: 9 %
Lymphs Abs: 0.9 10*3/uL (ref 0.7–4.0)
MCH: 29.6 pg (ref 26.0–34.0)
MCHC: 31.2 g/dL (ref 30.0–36.0)
MCV: 94.7 fL (ref 80.0–100.0)
Monocytes Absolute: 0.1 10*3/uL (ref 0.1–1.0)
Monocytes Relative: 2 %
Neutro Abs: 8.4 10*3/uL — ABNORMAL HIGH (ref 1.7–7.7)
Neutrophils Relative %: 87 %
Platelets: 183 10*3/uL (ref 150–400)
RBC: 4.87 MIL/uL (ref 3.87–5.11)
RDW: 12.7 % (ref 11.5–15.5)
WBC: 9.6 10*3/uL (ref 4.0–10.5)
nRBC: 0 % (ref 0.0–0.2)

## 2022-12-07 LAB — COMPREHENSIVE METABOLIC PANEL
ALT: 16 U/L (ref 0–44)
AST: 14 U/L — ABNORMAL LOW (ref 15–41)
Albumin: 3.3 g/dL — ABNORMAL LOW (ref 3.5–5.0)
Alkaline Phosphatase: 71 U/L (ref 38–126)
Anion gap: 9 (ref 5–15)
BUN: 10 mg/dL (ref 6–20)
CO2: 28 mmol/L (ref 22–32)
Calcium: 8.7 mg/dL — ABNORMAL LOW (ref 8.9–10.3)
Chloride: 98 mmol/L (ref 98–111)
Creatinine, Ser: 0.55 mg/dL (ref 0.44–1.00)
GFR, Estimated: >60 mL/min (ref >60)
Glucose, Bld: 130 mg/dL — ABNORMAL HIGH (ref 70–99)
Potassium: 4.2 mmol/L (ref 3.5–5.1)
Sodium: 135 mmol/L (ref 135–145)
Total Bilirubin: 0.5 mg/dL (ref 0.3–1.2)
Total Protein: 6.8 g/dL (ref 6.5–8.1)

## 2022-12-07 LAB — PROCALCITONIN
Procalcitonin: <0.1 ng/mL
Procalcitonin: <0.1 ng/mL

## 2022-12-07 LAB — LACTATE DEHYDROGENASE: LDH: 150 U/L (ref 98–192)

## 2022-12-07 LAB — MAGNESIUM: Magnesium: 2.4 mg/dL (ref 1.7–2.4)

## 2022-12-07 MED ORDER — IPRATROPIUM-ALBUTEROL 0.5-2.5 (3) MG/3ML IN SOLN
3.0000 mL | Freq: Two times a day (BID) | RESPIRATORY_TRACT | Status: DC
  Administered 2022-12-08: 3 mL via RESPIRATORY_TRACT
  Filled 2022-12-07: qty 3

## 2022-12-07 MED ORDER — IPRATROPIUM-ALBUTEROL 0.5-2.5 (3) MG/3ML IN SOLN
3.0000 mL | Freq: Three times a day (TID) | RESPIRATORY_TRACT | Status: DC
  Administered 2022-12-07 (×3): 3 mL via RESPIRATORY_TRACT
  Filled 2022-12-07 (×3): qty 3

## 2022-12-07 MED ORDER — BISOPROLOL FUMARATE 5 MG PO TABS
2.5000 mg | ORAL_TABLET | Freq: Every day | ORAL | Status: DC
  Administered 2022-12-07 – 2022-12-08 (×2): 2.5 mg via ORAL
  Filled 2022-12-07 (×2): qty 1

## 2022-12-07 MED ORDER — ENOXAPARIN SODIUM 80 MG/0.8ML IJ SOSY
75.0000 mg | PREFILLED_SYRINGE | Freq: Every day | INTRAMUSCULAR | Status: DC
  Administered 2022-12-07: 75 mg via SUBCUTANEOUS
  Filled 2022-12-07: qty 0.8

## 2022-12-07 MED ORDER — SODIUM CHLORIDE 0.9 % IV SOLN
100.0000 mg | Freq: Every day | INTRAVENOUS | Status: AC
  Administered 2022-12-08: 100 mg via INTRAVENOUS
  Filled 2022-12-07 (×2): qty 20

## 2022-12-07 NOTE — Progress Notes (Signed)
Pharmacy Brief Note - COVID treatment  Day #2 or 3 remdesivir. MD considered transition to Paxlovid, however, discussed with Dr. Tawanna Solo: since only one more dose of remdesivir due tomorrow to complete therapy, decided to proceed with current remdesivir orders and forego Paxlovid.  Peggyann Juba, PharmD, BCPS 12/07/2022 11:37 AM

## 2022-12-07 NOTE — Progress Notes (Signed)
Pt refused cpap tonight.  Pt was advised that RT is available all night should she change her mind.  RN aware.

## 2022-12-07 NOTE — Progress Notes (Signed)
  Transition of Care (TOC) Screening Note   Patient Details  Name: April Ellis Date of Birth: January 09, 1976   Transition of Care [facility]) CM/SW Contact:    Vassie Moselle, LCSW Phone Number: 12/07/2022, 11:37 AM    Transition of Care Department [facility]) has reviewed patient and no TOC needs have been identified at this time. We will continue to monitor patient advancement through interdisciplinary progression rounds. If new patient transition needs arise, please place a TOC consult.

## 2022-12-07 NOTE — Progress Notes (Signed)
PROGRESS NOTE  April Ellis  [id number] DOB: [birth year] DOA: 12/06/2022 PCP: [name], FNP   Brief Narrative:  Patient is a 47 year old female with history of COPD/ asthma, OSA, morbid obesity, chronic combined systolic/diastolic CHF, hypertension, depression who presented to the emergency room with worsening shortness of breath.   On presentation, she was found to be hypoxic, 68% on room air and was placed on 15 L of nonrebreather.  COVID screening test  came out to be positive.  She rapidly  improved after she was treated with bronchodilators, magnesium, steroid.Weaned to 2 L of oxygen.  She was last admitted in February for asthma exacerbation.  Follows with pulmonology Dr Annamaria Boots and takes Trelegy. She was recently treated for COPD exacerbation with azithromycin and prednisone taper. Patient was admitted for the management of acute hypoxic respiratory failure secondary to COVID infection.  This morning she remains comfortable, has been weaned to room air.  Plan for monitoring 1 more night and possible plan for dc  in a.m.  Assessment & Plan:  Principal Problem:   COVID-19 virus infection Active Problems:   Acute respiratory failure with hypoxia (HCC)   Asthma with COPD with exacerbation (HCC)   Asthma-COPD overlap syndrome   Morbid obesity with BMI of 60.0-69.9, adult (HCC)   Tobacco abuse   Chronic combined systolic and diastolic CHF (congestive heart failure) ([place])   Obstructive sleep apnea   Essential hypertension   Depression   Acute hypoxic respiratory failure: Secondary to COVID infection.  Saturating 68% on room air and had to put on nonrebreather.  Currently on 2 L of oxygen per minute. We will wean to room air.   COVID infection: Presented with worsening shortness of breath, cough.  Started on remdesivir.On decadron.  Monitor inflammatory markers.  We will switch to Paxlovid.  Free of wheezing.  Denies any shortness of breath or cough today.  Acute exacerbation of  asthma/COPD: In the setting of COVID infection.  Not on oxygen at home.  Follows with pulmonology.  On Trelegy Ellipta.  Continue steroids, bronchodilators .  Much better now  Chronic combined systolic/diastolic CHF: Currently not in exacerbation.  BNP not elevated.  Chest x-ray negative for signs of pulm edema/effusion.  Takes Lasix 40 mg daily at home, continued.  Takes Vania Rea, Entresto, spironolactone.  Echocardiogram done on 05/2022 showed EF of 35%, global hypokinesis, grade 2 diastolic dysfunction.  Recommend follow-up with cardiology as an outpatient.  Tobacco use: Ongoing tobacco use.  Counseled cessation.  Continue with the patch  History of OSA: On CPAP at night  Hypertension:On bisoprolol at home.BP stable  Depression: Continue Wellbutrin  Morbid obesity: BMI of 61.6.,takes Wegovy at home        DVT prophylaxis:Lovenox     Code Status: Full Code  Family Communication: None at bedside  Patient status:Inpatient  Patient is from :Home  Anticipated discharge NE:[id number]  Estimated DC date:tomorrow   Consultants: None  Procedures:None  Antimicrobials:  Anti-infectives (From admission, onward)    Start     Dose/Rate Route Frequency Ordered Stop   12/07/22 1000  remdesivir 100 mg in sodium chloride 0.9 % 100 mL IVPB       See Hyperspace for full Linked Orders Report.   100 mg 200 mL/hr over 30 Minutes Intravenous Daily 12/06/22 1914 12/09/22 0959   12/06/22 2000  remdesivir 200 mg in sodium chloride 0.9% 250 mL IVPB       See Hyperspace for full Linked Orders Report.   200 mg 580  mL/hr over 30 Minutes Intravenous Once 12/06/22 1914 12/06/22 2043       Subjective:  Patient seen and examined at bedside today.  Hemodynamically stable ,very comfortable.  Denies any shortness of breath or cough.  Feels much better today.  She was on 2 L of oxygen, I weaned her to room air.  We discussed about monitoring 1 more night with possible plan for discharge tomorrow if she  continues to remain good  Objective: Vitals:   12/06/22 2210 12/07/22 0030 12/07/22 0414 12/07/22 0743  BP:  (!) 158/73 (!) 143/84   Pulse:  89 81   Resp:  20 18   Temp:  98.1 F (36.7 C) 97.6 F (36.4 C)   TempSrc:  Oral Oral   SpO2: 94% 96% 97% 98%  Weight:      Height:        Intake/Output Summary (Last 24 hours) at 12/07/2022 0756 Last data filed at 12/07/2022 0606 Gross per 24 hour  Intake 1290 ml  Output --  Net 1290 ml   Filed Weights   12/06/22 1546  Weight: (!) 148 kg    Examination:  General exam: Overall comfortable, not in distress, morbidly obese HEENT: PERRL Respiratory system:  no wheezes or crackles, mildly diminished air sounds bilaterally Cardiovascular system: S1 & S2 heard, RRR.  Gastrointestinal system: Abdomen is nondistended, soft and nontender. Central nervous system: Alert and oriented Extremities: No edema, no clubbing ,no cyanosis Skin: No rashes, no ulcers,no icterus     Data Reviewed: I have personally reviewed following labs and imaging studies  CBC: Recent Labs  Lab 12/06/22 1544 12/07/22 0512  WBC 14.5* 9.6  NEUTROABS  --  8.4*  HGB 15.2* 14.4  HCT 49.2* 46.1*  MCV 96.3 94.7  PLT 200 [id number]   Basic Metabolic Panel: Recent Labs  Lab 12/06/22 1544 12/07/22 0512  NA 140 135  K 3.7 4.2  CL 103 98  CO2 30 28  GLUCOSE 99 130*  BUN 12 10  CREATININE 0.76 0.55  CALCIUM 8.4* 8.7*  MG  --  2.4     Recent Results (from the past 240 hour(s))  Resp panel by RT-PCR (RSV, Flu A&B, Covid) Anterior Nasal Swab     Status: Abnormal   Collection Time: 12/06/22  4:10 PM   Specimen: Anterior Nasal Swab  Result Value Ref Range Status   SARS Coronavirus 2 by RT PCR POSITIVE (A) NEGATIVE Final    Comment: (NOTE) SARS-CoV-2 target nucleic acids are DETECTED.  The SARS-CoV-2 RNA is generally detectable in upper respiratory specimens during the acute phase of infection. Positive results are indicative of the presence of the identified  virus, but do not rule out bacterial infection or co-infection with other pathogens not detected by the test. Clinical correlation with patient history and other diagnostic information is necessary to determine patient infection status. The expected result is Negative.  Fact Sheet for Patients: EntrepreneurPulse.com.au  Fact Sheet for Healthcare Providers: IncredibleEmployment.be  This test is not yet approved or cleared by the [place] FDA and  has been authorized for detection and/or diagnosis of SARS-CoV-2 by FDA under an Emergency Use Authorization (EUA).  This EUA will remain in effect (meaning this test can be used) for the duration of  the COVID-19 declaration under Section 564(b)(1) of the A ct, 21 U.S.C. section 360bbb-3(b)(1), unless the authorization is terminated or revoked sooner.     Influenza A by PCR NEGATIVE NEGATIVE Final   Influenza B by PCR NEGATIVE  NEGATIVE Final    Comment: (NOTE) The Xpert Xpress SARS-CoV-2/FLU/RSV plus assay is intended as an aid in the diagnosis of influenza from Nasopharyngeal swab specimens and should not be used as a sole basis for treatment. Nasal washings and aspirates are unacceptable for Xpert Xpress SARS-CoV-2/FLU/RSV testing.  Fact Sheet for Patients: EntrepreneurPulse.com.au  Fact Sheet for Healthcare Providers: IncredibleEmployment.be  This test is not yet approved or cleared by the [place] FDA and has been authorized for detection and/or diagnosis of SARS-CoV-2 by FDA under an Emergency Use Authorization (EUA). This EUA will remain in effect (meaning this test can be used) for the duration of the COVID-19 declaration under Section 564(b)(1) of the Act, 21 U.S.C. section 360bbb-3(b)(1), unless the authorization is terminated or revoked.     Resp Syncytial Virus by PCR NEGATIVE NEGATIVE Final    Comment: (NOTE) Fact Sheet for  Patients: EntrepreneurPulse.com.au  Fact Sheet for Healthcare Providers: IncredibleEmployment.be  This test is not yet approved or cleared by the [place] FDA and has been authorized for detection and/or diagnosis of SARS-CoV-2 by FDA under an Emergency Use Authorization (EUA). This EUA will remain in effect (meaning this test can be used) for the duration of the COVID-19 declaration under Section 564(b)(1) of the Act, 21 U.S.C. section 360bbb-3(b)(1), unless the authorization is terminated or revoked.  Performed at [facility], [place] [address] [place], [place] [address]      Radiology Studies: [facility] Chest Port 1 View  Result Date: 12/06/2022 CLINICAL DATA:  Shortness of breath and wheezing. EXAM: PORTABLE CHEST 1 VIEW COMPARISON:  08/15/2022 FINDINGS: The cardiomediastinal silhouette is unremarkable. There is no evidence of focal airspace disease, pulmonary edema, suspicious pulmonary nodule/mass, pleural effusion, or pneumothorax. No acute bony abnormalities are identified. IMPRESSION: No active disease. Electronically Signed   By: Margarette Canada M.D.   On: 12/06/2022 16:37    Scheduled Meds:  buPROPion  300 mg Oral Daily   dexamethasone  6 mg Oral Daily   enoxaparin (LOVENOX) injection  60 mg Subcutaneous QHS   furosemide  40 mg Oral Daily   ipratropium-albuterol  3 mL Nebulization TID   nicotine  7 mg Transdermal Daily   sodium chloride flush  3 mL Intravenous Q12H   Continuous Infusions:  remdesivir 100 mg in sodium chloride 0.9 % 100 mL IVPB       LOS: 0 days   Shelly Coss, MD [facility] P3/08/2023, 7:56 AM

## 2022-12-07 NOTE — Telephone Encounter (Signed)
Received a Medical clearance form from [facility] requesting clearance for patient to have a Colonoscopy. Propofol will be used. Form was signed by Dr. Daniel Nones and successfully faxed to [phone] on Tuesday, March 12,. Form will be scanned into patients chart.

## 2022-12-08 DIAGNOSIS — U071 COVID-19: Secondary | ICD-10-CM | POA: Diagnosis not present

## 2022-12-08 LAB — BASIC METABOLIC PANEL
Anion gap: 10 (ref 5–15)
BUN: 16 mg/dL (ref 6–20)
CO2: 31 mmol/L (ref 22–32)
Calcium: 8.8 mg/dL — ABNORMAL LOW (ref 8.9–10.3)
Chloride: 97 mmol/L — ABNORMAL LOW (ref 98–111)
Creatinine, Ser: 0.62 mg/dL (ref 0.44–1.00)
GFR, Estimated: >60 mL/min (ref >60)
Glucose, Bld: 99 mg/dL (ref 70–99)
Potassium: 4.4 mmol/L (ref 3.5–5.1)
Sodium: 138 mmol/L (ref 135–145)

## 2022-12-08 LAB — C-REACTIVE PROTEIN: CRP: 0.8 mg/dL (ref <1.0)

## 2022-12-08 MED ORDER — DEXAMETHASONE 2 MG PO TABS: 2.0000 mg | ORAL_TABLET | Freq: Every day | ORAL | 0 refills | Status: DC

## 2022-12-08 NOTE — Discharge Summary (Signed)
Physician Discharge Summary  April Ellis [id number] DOB: 04-11-1976 DOA: 12/06/2022  PCP: [name], FNP  Admit date: 12/06/2022 Discharge date: 12/08/2022  Admitted From: Home Disposition:  Home  Discharge Condition:Stable CODE STATUS:FULL Diet recommendation: Heart Healthy   Brief/Interim Summary:  Patient is a 47 year old female with history of COPD/ asthma, OSA, morbid obesity, chronic combined systolic/diastolic CHF, hypertension, depression who presented to the emergency room with worsening shortness of breath.   On presentation, she was found to be hypoxic, 68% on room air and was placed on 15 L of nonrebreather.  COVID screening test  came out to be positive. Patient was admitted for the management of acute hypoxic respiratory failure secondary to COVID infectionShe rapidly  improved after she was treated with bronchodilators, magnesium, steroid.Weaned to 2 L of oxygen then room air.   This morning she remains comfortable on room air.  Plan is to discharge her home with tapering dose of Decadron. Follows with pulmonology Dr Annamaria Boots and takes Trelegy.   Following problems were addressed during the hospitalization:  Acute hypoxic respiratory failure: Secondary to COVID infection.  Saturating 68% on room air and had to put on nonrebreather.  Currently on  weaned to room air.    COVID infection: Presented with worsening shortness of breath, cough.  Started on remdesivir,got 3 doses. Inflammatory markers stable. remains comfortable on room air.  Lungs clear to auscultation.  Will continue tapering dose of dexamethasone  Acute exacerbation of asthma/COPD: In the setting of COVID infection.  Not on oxygen at home.  Follows with pulmonology.  On Trelegy Ellipta.  Continue steroids, bronchodilators .  Much better now   Chronic combined systolic/diastolic CHF: Currently not in exacerbation.  BNP not elevated.  Chest x-ray negative for signs of pulm edema/effusion.  Takes Lasix 40 mg  daily at home, continued.  Takes Vania Rea, Entresto, spironolactone.  Echocardiogram done on 05/2022 showed EF of 35%, global hypokinesis, grade 2 diastolic dysfunction.  Recommend follow-up with cardiology as an outpatient.   Tobacco use: Ongoing tobacco use.  Counseled cessation.  Continue with the patch   History of OSA: On CPAP at night   Hypertension:On bisoprolol at home.BP stable   Depression: Continue Wellbutrin   Morbid obesity: BMI of 61.6.,takes Wegovy at home   Discharge Diagnoses:  Principal Problem:   COVID-19 virus infection Active Problems:   Acute respiratory failure with hypoxia (HCC)   Asthma with COPD with exacerbation (HCC)   Asthma-COPD overlap syndrome   Morbid obesity with BMI of 60.0-69.9, adult (HCC)   Tobacco abuse   Chronic combined systolic and diastolic CHF (congestive heart failure) ([place])   Obstructive sleep apnea   Essential hypertension   Depression    Discharge Instructions  Discharge Instructions     Diet - low sodium heart healthy   Complete by: As directed    Discharge instructions   Complete by: As directed    1)Please take prescribed medications as instructed 2)Quit smoking 3)Follow up with your PCP and pulmonologist as an outpatient   Increase activity slowly   Complete by: As directed       Allergies as of 12/08/2022       Reactions   Penicillins Other (See Comments)   Childhood Allergy Did it involve swelling of the face/tongue/throat, SOB, or low BP? UNK Did it involve sudden or severe rash/hives, skin peeling, or any reaction on the inside of your mouth or nose? UNK Did you need to seek medical attention at a hospital or doctor's  office? UNK When did it last happen?      more than 10 years If all above answers are "NO", may proceed with cephalosporin use.   Pineapple Other (See Comments)   Tested allergic to this        Medication List     STOP taking these medications    azithromycin 250 MG tablet Commonly  known as: ZITHROMAX   guaiFENesin-dextromethorphan 100-10 MG/5ML syrup Commonly known as: ROBITUSSIN DM   predniSONE 20 MG tablet Commonly known as: DELTASONE       TAKE these medications    albuterol 108 (90 Base) MCG/ACT inhaler Commonly known as: VENTOLIN HFA Inhale 2 puffs every 4-6 hours as needed What changed:  how much to take how to take this when to take this reasons to take this additional instructions   bisoprolol 5 MG tablet Commonly known as: ZEBETA Take 2.5 mg by mouth daily.   buPROPion 300 MG 24 hr tablet Commonly known as: WELLBUTRIN XL TAKE 1 TABLET BY MOUTH EVERY DAY IN THE MORNING What changed:  how much to take how to take this when to take this additional instructions   dexamethasone 2 MG tablet Commonly known as: DECADRON Take 1 tablet (2 mg total) by mouth daily. Please take 3 pills tomorrow then 2 pills daily for 3 days then 1 pill daily for 3 days then stop Start taking on: December 09, 2022   diphenhydrAMINE 25 mg capsule Commonly known as: BENADRYL Take 25 mg by mouth daily as needed for allergies.   EMERGEN-C IMMUNE PO Take 1 packet by mouth daily as needed (cold).   EPINEPHrine 0.3 mg/0.3 mL Soaj injection Commonly known as: EPI-PEN Inject 0.3 mg into the muscle as needed for anaphylaxis.   furosemide 20 MG tablet Commonly known as: LASIX Take 2 tablets (40 mg total) by mouth daily.   guaiFENesin 600 MG 12 hr tablet Commonly known as: MUCINEX Take 600 mg by mouth as needed for cough or to loosen phlegm.   ipratropium-albuterol 0.5-2.5 (3) MG/3ML Soln Commonly known as: DUONEB Take 3 mLs by nebulization as needed (for wheezing and shortness of breath).   Jardiance 10 MG Tabs tablet Generic drug: empagliflozin Take 10 mg by mouth daily.   montelukast 10 MG tablet Commonly known as: SINGULAIR Take 1 tablet (10 mg total) by mouth at bedtime. TAKE 1 TABLET BY MOUTH EVERYDAY AT BEDTIME Strength: 10 mg What changed:  additional instructions   Nicorette 4 MG lozenge Generic drug: nicotine polacrilex Take 4 mg by mouth as needed for smoking cessation.   nicotine 14 mg/24hr patch Commonly known as: NICODERM CQ - dosed in mg/24 hours Place 1 patch (14 mg total) onto the skin daily.   sacubitril-valsartan 24-26 MG Commonly known as: ENTRESTO Take 1 tablet by mouth 2 (two) times daily.   spironolactone 25 MG tablet Commonly known as: ALDACTONE Take 12.5 mg by mouth daily.   Trelegy Ellipta 200-62.5-25 MCG/ACT Aepb Generic drug: Fluticasone-Umeclidin-Vilant Inhale 1 puff then rinse mouth, once daily What changed:  how much to take how to take this when to take this additional instructions   Wegovy 0.25 MG/0.5ML Soaj Generic drug: Semaglutide-Weight Management Inject 0.25 mg into the skin once a week.        Allergies  Allergen Reactions   Penicillins Other (See Comments)    Childhood Allergy Did it involve swelling of the face/tongue/throat, SOB, or low BP? UNK Did it involve sudden or severe rash/hives, skin peeling, or any reaction on  the inside of your mouth or nose? UNK Did you need to seek medical attention at a hospital or doctor's office? UNK When did it last happen?      more than 10 years If all above answers are "NO", may proceed with cephalosporin use.    Pineapple Other (See Comments)    Tested allergic to this    Consultations: None   Procedures/Studies: DG Chest Port 1 View  Result Date: 12/06/2022 CLINICAL DATA:  Shortness of breath and wheezing. EXAM: PORTABLE CHEST 1 VIEW COMPARISON:  08/15/2022 FINDINGS: The cardiomediastinal silhouette is unremarkable. There is no evidence of focal airspace disease, pulmonary edema, suspicious pulmonary nodule/mass, pleural effusion, or pneumothorax. No acute bony abnormalities are identified. IMPRESSION: No active disease. Electronically Signed   By: Margarette Canada M.D.   On: 12/06/2022 16:37   DG Chest Portable 1 View  Result  Date: 11/12/2022 CLINICAL DATA:  Shortness of breath EXAM: PORTABLE CHEST 1 VIEW COMPARISON:  None Available. FINDINGS: Borderline cardiac enlargement. No acute airspace disease. No pleural effusion or pneumothorax. IMPRESSION: No active disease. Borderline cardiomegaly. Electronically Signed   By: Donavan Foil M.D.   On: 11/12/2022 23:40   CT Head Wo Contrast  Result Date: 11/12/2022 CLINICAL DATA:  Found down EXAM: CT HEAD WITHOUT CONTRAST TECHNIQUE: Contiguous axial images were obtained from the base of the skull through the vertex without intravenous contrast. RADIATION DOSE REDUCTION: This exam was performed according to the departmental dose-optimization program which includes automated exposure control, adjustment of the mA and/or kV according to patient size and/or use of iterative reconstruction technique. COMPARISON:  None Available. FINDINGS: Brain: No evidence of acute infarction, hemorrhage, mass, mass effect, or midline shift. No hydrocephalus or extra-axial fluid collection. Gray-white differentiation is preserved. The basilar cisterns are patent. Vascular: No hyperdense vessel. Skull: Negative for fracture or focal lesion. Sinuses/Orbits: Mucosal thickening in the ethmoid air cells. No acute finding in the orbits. Other: The mastoid air cells are well aerated. IMPRESSION: No acute intracranial process. Electronically Signed   By: Merilyn Baba M.D.   On: 11/12/2022 23:19      Subjective: Patient seen and examined at bedside today.  Hemodynamically stable for discharge.  On room air.  Denies any complaints.  Discharge Exam: Vitals:   12/08/22 0510 12/08/22 0740  BP:    Pulse:    Resp:    Temp:    SpO2: 98% 97%   Vitals:   12/07/22 2235 12/08/22 0504 12/08/22 0510 12/08/22 0740  BP: 107/65 (!) 128/92    Pulse: 68 73    Resp: 18 18    Temp: 97.9 F (36.6 C) 97.7 F (36.5 C)    TempSrc: Oral Oral    SpO2: 99% 96% 98% 97%  Weight:      Height:        General: Pt is  alert, awake, not in acute distress,obese Cardiovascular: RRR, S1/S2 +, no rubs, no gallops Respiratory: CTA bilaterally, no wheezing, no rhonchi Abdominal: Soft, NT, ND, bowel sounds + Extremities: no edema, no cyanosis    The results of significant diagnostics from this hospitalization (including imaging, microbiology, ancillary and laboratory) are listed below for reference.     Microbiology: Recent Results (from the past 240 hour(s))  Resp panel by RT-PCR (RSV, Flu A&B, Covid) Anterior Nasal Swab     Status: Abnormal   Collection Time: 12/06/22  4:10 PM   Specimen: Anterior Nasal Swab  Result Value Ref Range Status   SARS Coronavirus 2 by  RT PCR POSITIVE (A) NEGATIVE Final    Comment: (NOTE) SARS-CoV-2 target nucleic acids are DETECTED.  The SARS-CoV-2 RNA is generally detectable in upper respiratory specimens during the acute phase of infection. Positive results are indicative of the presence of the identified virus, but do not rule out bacterial infection or co-infection with other pathogens not detected by the test. Clinical correlation with patient history and other diagnostic information is necessary to determine patient infection status. The expected result is Negative.  Fact Sheet for Patients: EntrepreneurPulse.com.au  Fact Sheet for Healthcare Providers: IncredibleEmployment.be  This test is not yet approved or cleared by the [place] FDA and  has been authorized for detection and/or diagnosis of SARS-CoV-2 by FDA under an Emergency Use Authorization (EUA).  This EUA will remain in effect (meaning this test can be used) for the duration of  the COVID-19 declaration under Section 564(b)(1) of the A ct, 21 U.S.C. section 360bbb-3(b)(1), unless the authorization is terminated or revoked sooner.     Influenza A by PCR NEGATIVE NEGATIVE Final   Influenza B by PCR NEGATIVE NEGATIVE Final    Comment: (NOTE) The Xpert Xpress  SARS-CoV-2/FLU/RSV plus assay is intended as an aid in the diagnosis of influenza from Nasopharyngeal swab specimens and should not be used as a sole basis for treatment. Nasal washings and aspirates are unacceptable for Xpert Xpress SARS-CoV-2/FLU/RSV testing.  Fact Sheet for Patients: EntrepreneurPulse.com.au  Fact Sheet for Healthcare Providers: IncredibleEmployment.be  This test is not yet approved or cleared by the [place] FDA and has been authorized for detection and/or diagnosis of SARS-CoV-2 by FDA under an Emergency Use Authorization (EUA). This EUA will remain in effect (meaning this test can be used) for the duration of the COVID-19 declaration under Section 564(b)(1) of the Act, 21 U.S.C. section 360bbb-3(b)(1), unless the authorization is terminated or revoked.     Resp Syncytial Virus by PCR NEGATIVE NEGATIVE Final    Comment: (NOTE) Fact Sheet for Patients: EntrepreneurPulse.com.au  Fact Sheet for Healthcare Providers: IncredibleEmployment.be  This test is not yet approved or cleared by the [place] FDA and has been authorized for detection and/or diagnosis of SARS-CoV-2 by FDA under an Emergency Use Authorization (EUA). This EUA will remain in effect (meaning this test can be used) for the duration of the COVID-19 declaration under Section 564(b)(1) of the Act, 21 U.S.C. section 360bbb-3(b)(1), unless the authorization is terminated or revoked.  Performed at [facility], [place] [address] [place], [place] [address]      Labs: BNP (last 3 results) Recent Labs    08/15/22 0944 10/25/22 1434 12/06/22 1544  BNP 53.0 14.2 [id number]   Basic Metabolic Panel: Recent Labs  Lab 12/06/22 1544 12/07/22 0512 12/08/22 0526  NA 140 135 138  K 3.7 4.2 4.4  CL 103 98 97*  CO2 '30 28 31  '$ GLUCOSE 99 130* 99  BUN '12 10 16  '$ CREATININE 0.76 0.55 0.62  CALCIUM 8.4*  8.7* 8.8*  MG  --  2.4  --    Liver Function Tests: Recent Labs  Lab 12/06/22 1544 12/07/22 0512  AST 16 14*  ALT 19 16  ALKPHOS 67 71  BILITOT 1.1 0.5  PROT 7.0 6.8  ALBUMIN 3.6 3.3*   No results for input(s): "LIPASE", "AMYLASE" in the last 168 hours. No results for input(s): "AMMONIA" in the last 168 hours. CBC: Recent Labs  Lab 12/06/22 1544 12/07/22 0512  WBC 14.5* 9.6  NEUTROABS  --  8.4*  HGB 15.2* 14.4  HCT 49.2* 46.1*  MCV 96.3 94.7  PLT 200 183   Cardiac Enzymes: No results for input(s): "CKTOTAL", "CKMB", "CKMBINDEX", "TROPONINI" in the last 168 hours. BNP: Invalid input(s): "POCBNP" CBG: No results for input(s): "GLUCAP" in the last 168 hours. D-Dimer Recent Labs    12/06/22 2106 12/07/22 0512  DDIMER <0.27 <0.27   Hgb A1c No results for input(s): "HGBA1C" in the last 72 hours. Lipid Profile No results for input(s): "CHOL", "HDL", "LDLCALC", "TRIG", "CHOLHDL", "LDLDIRECT" in the last 72 hours. Thyroid function studies No results for input(s): "TSH", "T4TOTAL", "T3FREE", "THYROIDAB" in the last 72 hours.  Invalid input(s): "FREET3" Anemia work up Recent Labs    12/06/22 2106  FERRITIN 48   Urinalysis    Component Value Date/Time   COLORURINE STRAW (A) 05/01/2022 1935   APPEARANCEUR CLEAR 05/01/2022 1935   LABSPEC 1.010 05/01/2022 1935   PHURINE 5.0 05/01/2022 1935   GLUCOSEU NEGATIVE 05/01/2022 1935   GLUCOSEU NEGATIVE 12/19/2008 1343   HGBUR SMALL (A) 05/01/2022 1935   BILIRUBINUR NEGATIVE 05/01/2022 1935   BILIRUBINUR negative 11/26/2019 1728   KETONESUR NEGATIVE 05/01/2022 1935   PROTEINUR NEGATIVE 05/01/2022 1935   UROBILINOGEN 1.0 11/26/2019 1728   UROBILINOGEN 1.0 04/09/2014 2007   NITRITE NEGATIVE 05/01/2022 1935   LEUKOCYTESUR NEGATIVE 05/01/2022 1935   Sepsis Labs Recent Labs  Lab 12/06/22 1544 12/07/22 0512  WBC 14.5* 9.6   Microbiology Recent Results (from the past 240 hour(s))  Resp panel by RT-PCR (RSV, Flu  A&B, Covid) Anterior Nasal Swab     Status: Abnormal   Collection Time: 12/06/22  4:10 PM   Specimen: Anterior Nasal Swab  Result Value Ref Range Status   SARS Coronavirus 2 by RT PCR POSITIVE (A) NEGATIVE Final    Comment: (NOTE) SARS-CoV-2 target nucleic acids are DETECTED.  The SARS-CoV-2 RNA is generally detectable in upper respiratory specimens during the acute phase of infection. Positive results are indicative of the presence of the identified virus, but do not rule out bacterial infection or co-infection with other pathogens not detected by the test. Clinical correlation with patient history and other diagnostic information is necessary to determine patient infection status. The expected result is Negative.  Fact Sheet for Patients: EntrepreneurPulse.com.au  Fact Sheet for Healthcare Providers: IncredibleEmployment.be  This test is not yet approved or cleared by the [place] FDA and  has been authorized for detection and/or diagnosis of SARS-CoV-2 by FDA under an Emergency Use Authorization (EUA).  This EUA will remain in effect (meaning this test can be used) for the duration of  the COVID-19 declaration under Section 564(b)(1) of the A ct, 21 U.S.C. section 360bbb-3(b)(1), unless the authorization is terminated or revoked sooner.     Influenza A by PCR NEGATIVE NEGATIVE Final   Influenza B by PCR NEGATIVE NEGATIVE Final    Comment: (NOTE) The Xpert Xpress SARS-CoV-2/FLU/RSV plus assay is intended as an aid in the diagnosis of influenza from Nasopharyngeal swab specimens and should not be used as a sole basis for treatment. Nasal washings and aspirates are unacceptable for Xpert Xpress SARS-CoV-2/FLU/RSV testing.  Fact Sheet for Patients: EntrepreneurPulse.com.au  Fact Sheet for Healthcare Providers: IncredibleEmployment.be  This test is not yet approved or cleared by the [place]  FDA and has been authorized for detection and/or diagnosis of SARS-CoV-2 by FDA under an Emergency Use Authorization (EUA). This EUA will remain in effect (meaning this test can be used) for the duration of the COVID-19 declaration under Section  564(b)(1) of the Act, 21 U.S.C. section 360bbb-3(b)(1), unless the authorization is terminated or revoked.     Resp Syncytial Virus by PCR NEGATIVE NEGATIVE Final    Comment: (NOTE) Fact Sheet for Patients: EntrepreneurPulse.com.au  Fact Sheet for Healthcare Providers: IncredibleEmployment.be  This test is not yet approved or cleared by the [place] FDA and has been authorized for detection and/or diagnosis of SARS-CoV-2 by FDA under an Emergency Use Authorization (EUA). This EUA will remain in effect (meaning this test can be used) for the duration of the COVID-19 declaration under Section 564(b)(1) of the Act, 21 U.S.C. section 360bbb-3(b)(1), unless the authorization is terminated or revoked.  Performed at [facility], [place] [address] [place], [place] [address]     Please note: You were cared for by a hospitalist during your hospital stay. Once you are discharged, your primary care physician will handle any further medical issues. Please note that NO REFILLS for any discharge medications will be authorized once you are discharged, as it is imperative that you return to your primary care physician (or establish a relationship with a primary care physician if you do not have one) for your post hospital discharge needs so that they can reassess your need for medications and monitor your lab values.    Time coordinating discharge: 40 minutes  SIGNED:   Shelly Coss, MD  [facility] 12/08/2022, 11:03 AM Pager [phone]  If 7PM-7AM, please contact night-coverage [email or web address] Password [email or web address]

## 2022-12-09 ENCOUNTER — Telehealth: Payer: Self-pay

## 2022-12-09 NOTE — Transitions of Care (Post Inpatient/ED Visit) (Signed)
   12/09/2022  Name: JAYDAH STAHLE MRN: [id number] DOB: [birth year]  Today's TOC FU Call Status: Today's TOC FU Call Status:: Successful TOC FU Call Competed TOC FU Call Complete Date: 12/09/22  Transition Care Management Follow-up Telephone Call Date of Discharge: 12/08/22 Discharge Facility: [facility]) Type of Discharge: Emergency Department Reason for ED Visit: Respiratory, Other: (covid) Respiratory Diagnosis: Asthma (Uncontrolled) How have you been since you were released from the hospital?: Better Any questions or concerns?: No  Items Reviewed: Did you receive and understand the discharge instructions provided?: Yes Medications obtained and verified?: Yes (Medications Reviewed) Any new allergies since your discharge?: No Dietary orders reviewed?: No Do you have support at home?: Yes People in Home: significant other  Home Care and Equipment/Supplies: [place] Ordered?: No Any new equipment or medical supplies ordered?: No  Functional Questionnaire: Do you need assistance with bathing/showering or dressing?: No Do you need assistance with meal preparation?: No Do you need assistance with eating?: No Do you have difficulty maintaining continence: No Do you need assistance with getting out of bed/getting out of a chair/moving?: No Do you have difficulty managing or taking your medications?: No  Folllow up appointments reviewed: PCP Follow-up appointment confirmed?: No MD Provider Line Number:[phone] Given: Yes [facility] Follow-up appointment confirmed?: No Reason Specialist Follow-Up Not Confirmed: Patient has Specialist Provider Number and will Call for Appointment Do you need transportation to your follow-up appointment?: No Do you understand care options if your condition(s) worsen?: Yes-patient verbalized understanding    SIGNATURE Tami Lin.

## 2022-12-14 NOTE — Telephone Encounter (Signed)
Spoke with patient. She is completing dexamethasone course tomorrow. This was prescribed for COVID infection. She states she is feeling better now.  She is scheduled for Dupixent new start on 12/20/22  Knox Saliva, PharmD, MPH, BCPS, CPP Clinical Pharmacist (Rheumatology and Pulmonology)

## 2022-12-20 ENCOUNTER — Ambulatory Visit: Payer: BC Managed Care – PPO | Admitting: Pharmacist

## 2022-12-20 DIAGNOSIS — J4540 Moderate persistent asthma, uncomplicated: Secondary | ICD-10-CM

## 2022-12-20 DIAGNOSIS — Z7189 Other specified counseling: Secondary | ICD-10-CM

## 2022-12-20 DIAGNOSIS — J4489 Other specified chronic obstructive pulmonary disease: Secondary | ICD-10-CM

## 2022-12-20 MED ORDER — DUPIXENT 300 MG/2ML ~~LOC~~ SOAJ: 300.0000 mg | SUBCUTANEOUS | 1 refills | Status: DC

## 2022-12-20 MED ORDER — IPRATROPIUM-ALBUTEROL 0.5-2.5 (3) MG/3ML IN SOLN: 3.0000 mL | RESPIRATORY_TRACT | 5 refills | Status: DC | PRN

## 2022-12-20 MED ORDER — MONTELUKAST SODIUM 10 MG PO TABS: 10.0000 mg | ORAL_TABLET | Freq: Every day | ORAL | 3 refills | Status: DC

## 2022-12-20 NOTE — Patient Instructions (Addendum)
Your next [place] dose is due on 01/02/21, 01/17/23, and every 14 days thereafter  CONTINUE Trelegy 200-62.5-25 mcg (1 puff once daily), montelukast 10mg  nightly CONTINUE smoking cessation efforts!  Your prescription will be shipped from [facility]. Their phone number is [phone]  Please call to schedule shipment and confirm address. They will mail your medication to your home.  Your copay should be affordable. If you call the pharmacy and it is not affordable, please double-check that they are billing through your copay card as secondary coverage. That copay card information is: BIN: [id number] PCN: [id number]: [id number] ID: [id number] Copay card company phone number: [phone]  You will need to be seen by your provider in 3 to 4 months to assess how [place] is working for you. Please ensure you have a follow-up appointment scheduled in July or August 2024. Call our clinic if you need to make this appointment.  How to manage an injection site reaction: Remember the 5 C's: COUNTER - leave on the counter at least 30 minutes but up to overnight to bring medication to room temperature. This may help prevent stinging COLD - place something cold (like an ice gel pack or cold water bottle) on the injection site just before cleansing with alcohol. This may help reduce pain CLARITIN - use Claritin (generic name is loratadine) for the first two weeks of treatment or the day of, the day before, and the day after injecting. This will help to minimize injection site reactions CORTISONE CREAM - apply if injection site is irritated and itching CALL ME - if injection site reaction is bigger than the size of your fist, looks infected, blisters, or if you develop hives

## 2022-12-20 NOTE — Progress Notes (Signed)
HPI Patient presents today to [facility] to see pharmacy team for Ethete new start. She has oral corticosteroid-dependent asthma with COPD overlap. She was previously on Xolair but discontinued during time of COVID. Plan was to switch to Berna Bue but appears this was never done (unclear why pharmacy team appears to not have been involved in this process).  Last seen by Dr. Annamaria Boots on 11/26/22 and has had ED visit since then with positive COVID diagnosis. Was prescribed dexamethasone taper which she finished last week  She does report itchiness around mouth and face that is not aggravated by anything specific (she is unable to attribute to specific foods, medications, environmental allergens). She does have Epipen on hand.  Respiratory Medications Current regimen: Trelegy 200-62.5-25 mcg (1 puff once daily), montelukast 10mg  nightly Tried in past: Symbicort Patient reports no known adherence challenges  OBJECTIVE Allergies  Allergen Reactions   Penicillins Other (See Comments)    Childhood Allergy Did it involve swelling of the face/tongue/throat, SOB, or low BP? UNK Did it involve sudden or severe rash/hives, skin peeling, or any reaction on the inside of your mouth or nose? UNK Did you need to seek medical attention at a hospital or doctor's office? UNK When did it last happen?      more than 10 years If all above answers are "NO", may proceed with cephalosporin use.    Pineapple Other (See Comments)    Tested allergic to this    Outpatient Encounter Medications as of 12/20/2022  Medication Sig Note   albuterol (VENTOLIN HFA) 108 (90 Base) MCG/ACT inhaler Inhale 2 puffs every 4-6 hours as needed (Patient taking differently: Inhale 2 puffs into the lungs as needed for wheezing or shortness of breath.)    bisoprolol (ZEBETA) 5 MG tablet Take 2.5 mg by mouth daily.    buPROPion (WELLBUTRIN XL) 300 MG 24 hr tablet TAKE 1 TABLET BY MOUTH EVERY DAY IN THE MORNING (Patient taking  differently: Take 300 mg by mouth daily.)    dexamethasone (DECADRON) 2 MG tablet Take 1 tablet (2 mg total) by mouth daily. Please take 3 pills tomorrow then 2 pills daily for 3 days then 1 pill daily for 3 days then stop    diphenhydrAMINE (BENADRYL) 25 mg capsule Take 25 mg by mouth daily as needed for allergies.    EPINEPHrine 0.3 mg/0.3 mL IJ SOAJ injection Inject 0.3 mg into the muscle as needed for anaphylaxis.    Fluticasone-Umeclidin-Vilant (TRELEGY ELLIPTA) 200-62.5-25 MCG/ACT AEPB Inhale 1 puff then rinse mouth, once daily (Patient taking differently: Inhale 1 puff into the lungs daily.)    furosemide (LASIX) 20 MG tablet Take 2 tablets (40 mg total) by mouth daily.    guaiFENesin (MUCINEX) 600 MG 12 hr tablet Take 600 mg by mouth as needed for cough or to loosen phlegm.    ipratropium-albuterol (DUONEB) 0.5-2.5 (3) MG/3ML SOLN Take 3 mLs by nebulization as needed (for wheezing and shortness of breath).    JARDIANCE 10 MG TABS tablet Take 10 mg by mouth daily. 12/07/2022: Pt is adamant she is still taking this medication daily. Dispense report does not support this claim.    montelukast (SINGULAIR) 10 MG tablet Take 1 tablet (10 mg total) by mouth at bedtime. TAKE 1 TABLET BY MOUTH EVERYDAY AT BEDTIME Strength: 10 mg (Patient taking differently: Take 10 mg by mouth at bedtime.) 12/07/2022: Pt is adamant she is still taking this medication daily. Dispense report does not support this claim.  Multiple Vitamins-Minerals (EMERGEN-C IMMUNE PO) Take 1 packet by mouth daily as needed (cold).    nicotine (NICODERM CQ - DOSED IN MG/24 HOURS) 14 mg/24hr patch Place 1 patch (14 mg total) onto the skin daily.    nicotine polacrilex (NICORETTE) 4 MG lozenge Take 4 mg by mouth as needed for smoking cessation.    sacubitril-valsartan (ENTRESTO) 24-26 MG Take 1 tablet by mouth 2 (two) times daily. 12/07/2022: Pt is adamant she is still taking this medication daily. Dispense report does not support this  claim.     Semaglutide-Weight Management (WEGOVY) 0.25 MG/0.5ML SOAJ Inject 0.25 mg into the skin once a week. (Patient not taking: Reported on 12/07/2022)    spironolactone (ALDACTONE) 25 MG tablet Take 12.5 mg by mouth daily.    No facility-administered encounter medications on file as of 12/20/2022.     Immunization History  Administered Date(s) Administered   Influenza, Quadrivalent, Recombinant, Inj, Pf 09/06/2019   PFIZER(Purple Top)SARS-COV-2 Vaccination 12/05/2019, 12/26/2019   Td 12/19/2008   Td (Adult), 2 Lf Tetanus Toxid, Preservative Free 12/19/2008   Tdap 08/25/2021     PFTs    Latest Ref Rng & Units 05/01/2014   10:07 AM  PFT Results  FVC-Pre L 2.34   FVC-Predicted Pre % 80   FVC-Post L 2.47   FVC-Predicted Post % 84   Pre FEV1/FVC % % 58   Post FEV1/FCV % % 59   FEV1-Pre L 1.35   FEV1-Predicted Pre % 55   FEV1-Post L 1.46   DLCO uncorrected ml/min/mmHg 20.38   DLCO UNC% % 93   DLVA Predicted % 125   TLC L 4.73   TLC % Predicted % 98   RV % Predicted % 161      Eosinophils Most recent blood eosinophil count was <50 (likely due to chronic oral steroid use)  IgE: 332 on 11/01/22   Assessment   Biologics training for dupilumab (Dupixent)  Goals of therapy: Mechanism: human monoclonal IgG4 antibody that inhibits interleukin-4 and interleukin-13 cytokine-induced responses, including release of proinflammatory cytokines, chemokines, and IgE Reviewed that Dupixent is add-on medication and patient must continue maintenance inhaler regimen. Response to therapy: may take 4 months to determine efficacy. Discussed that patients generally feel improvement sooner than 4 months.  Side effects: injection site reaction (6-18%), antibody development (5-16%), ophthalmic conjunctivitis (2-16%), transient blood eosinophilia (1-2%)  Dose: 600mg  at Week 0 (administered today in clinic) followed by 300mg  every 14 days thereafter  Administration/Storage:  Reviewed  administration sites of thigh or abdomen (at least 2-3 inches away from abdomen). Reviewed the upper arm is only appropriate if caregiver is administering injection  Do not shake pen/syringe as this could lead to product foaming or precipitation. Do not use if solution is discolored or contains particulate matter or if window on prefilled pen is yellow (indicates pen has been used).  Reviewed storage of medication in refrigerator. Reviewed that [place] can be stored at room temperature in unopened carton for up to 14 days.  Access: Approval of Dupixent through: insurance Patient enrolled into copay card program to help with copay assistance.  Patient self-administered Dupixent 300mg /4ml x 2 (total dose 600mg ) in right lower abdomen and left lower abdomen using sample Dupixent 300mg /57mL autoinjector pen NDC: [phone] Lot: [id number] Expiration: 07/27/2024  Patient monitored for 30 minutes for adverse reaction.  Patient tolerated without issue. Denies any itchiness and irritation at injection site.  Medication Reconciliation  A drug regimen assessment was performed, including review of allergies, interactions, disease-state  management, dosing and immunization history. Medications were reviewed with the patient, including name, instructions, indication, goals of therapy, potential side effects, importance of adherence, and safe use.  Drug interaction(s): none noted  PLAN Continue Dupixent 300mg  every 14 days.  Next dose is due 01/03/23 and every 14 days thereafter. Rx sent to:  [facility] .  Patient provided with pharmacy phone number and advised to call later this week to schedule shipment to home. Patient provided with copay card information to provide to pharmacy if quoted copay exceeds $5 per month. Continue maintenance asthma regimen of: Trelegy 200-62.5-25 mcg (1 puff once daily), montelukast 10mg  nightly Continue smoking cessation efforts please  All questions  encouraged and answered.  Instructed patient to reach out with any further questions or concerns.  Thank you for allowing pharmacy to participate in this patient's care.  This appointment required 60 minutes of patient care (this includes precharting, chart review, review of results, face-to-face care, etc.).  Knox Saliva, PharmD, MPH, BCPS, CPP Clinical Pharmacist (Rheumatology and Pulmonology)

## 2022-12-21 ENCOUNTER — Telehealth: Payer: BC Managed Care – PPO | Admitting: Cardiology

## 2022-12-22 ENCOUNTER — Ambulatory Visit: Payer: BC Managed Care – PPO | Admitting: Nurse Practitioner

## 2022-12-22 ENCOUNTER — Encounter: Payer: Self-pay | Admitting: Nurse Practitioner

## 2022-12-22 VITALS — BP 126/72 | HR 94 | Temp 98.3°F | Ht 61.0 in | Wt 348.0 lb

## 2022-12-22 DIAGNOSIS — Z09 Encounter for follow-up examination after completed treatment for conditions other than malignant neoplasm: Secondary | ICD-10-CM

## 2022-12-22 DIAGNOSIS — J9601 Acute respiratory failure with hypoxia: Secondary | ICD-10-CM

## 2022-12-22 DIAGNOSIS — U071 COVID-19: Secondary | ICD-10-CM | POA: Diagnosis not present

## 2022-12-22 NOTE — Progress Notes (Signed)
I,Sheena H Holbrook,acting as a Education administrator for [name], FNP.,have documented all relevant documentation on the behalf of [name], FNP,as directed by  [name], FNP while in the presence of [name], [place].    Subjective:     Patient ID: April Ellis , female    DOB: 03-Oct-1975 , 47 y.o.   MRN: [id number]   Chief Complaint  Patient presents with   Hospitalization Follow-up    HPI  Patient presents today for follow up of recent hospitalization with COVID. She was treated with Remdisivir. She was admitted from 3/11-3/13. She had went to ER for shortness of breath and found to have covid. After she went home she was doing okay.  Patient reports she is feeling much better.   She did not have home health. She has an appt with the allergist on April 6th. She is now on Dupixent.   She has to renew her FMLA from last year.       Past Medical History:  Diagnosis Date   Anemia    Asthma    Bronchitis    COPD (chronic obstructive pulmonary disease) ([place])    Dyspnea    with asthma flair   Obesity    Respiratory arrest ([place]) 04/04/2020     Family History  Problem Relation Age of Onset   Diabetes Mother    Cancer Mother    Diabetes Sister    Hypertension Sister    Hypertension Sister    Urticaria Neg Hx    Immunodeficiency Neg Hx    Eczema Neg Hx    Asthma Neg Hx    Angioedema Neg Hx    Allergic rhinitis Neg Hx      Current Outpatient Medications:    albuterol (VENTOLIN HFA) 108 (90 Base) MCG/ACT inhaler, Inhale 2 puffs every 4-6 hours as needed, Disp: 18 g, Rfl: 12   bisoprolol (ZEBETA) 5 MG tablet, Take 2.5 mg by mouth daily., Disp: , Rfl:    buPROPion (WELLBUTRIN XL) 300 MG 24 hr tablet, TAKE 1 TABLET BY MOUTH EVERY DAY IN THE MORNING, Disp: 90 tablet, Rfl: 1   diphenhydrAMINE (BENADRYL) 25 mg capsule, Take 25 mg by mouth daily as needed for allergies., Disp: , Rfl:    Dupilumab (DUPIXENT) 300 MG/2ML SOPN, Inject 300 mg into the skin every 14 (fourteen)  days. Loading dose completed in clinic on 12/20/22, Disp: 12 mL, Rfl: 1   EPINEPHrine 0.3 mg/0.3 mL IJ SOAJ injection, Inject 0.3 mg into the muscle as needed for anaphylaxis., Disp: 2 each, Rfl: 5   Fluticasone-Umeclidin-Vilant (TRELEGY ELLIPTA) 200-62.5-25 MCG/ACT AEPB, Inhale 1 puff then rinse mouth, once daily (Patient taking differently: Inhale 1 puff into the lungs daily. RINSE MOUTH), Disp: 60 each, Rfl: 12   furosemide (LASIX) 20 MG tablet, Take 2 tablets (40 mg total) by mouth daily., Disp: 30 tablet, Rfl: 1   guaiFENesin (MUCINEX) 600 MG 12 hr tablet, Take 600 mg by mouth as needed for cough or to loosen phlegm., Disp: , Rfl:    ipratropium-albuterol (DUONEB) 0.5-2.5 (3) MG/3ML SOLN, Take 3 mLs by nebulization as needed (for wheezing and shortness of breath)., Disp: 360 mL, Rfl: 5   JARDIANCE 10 MG TABS tablet, Take 10 mg by mouth daily., Disp: , Rfl:    montelukast (SINGULAIR) 10 MG tablet, Take 1 tablet (10 mg total) by mouth at bedtime., Disp: 90 tablet, Rfl: 3   Multiple Vitamins-Minerals (EMERGEN-C IMMUNE PO), Take 1 packet by mouth daily as needed (cold)., Disp: ,  Rfl:    nicotine (NICODERM CQ - DOSED IN MG/24 HOURS) 14 mg/24hr patch, Place 1 patch (14 mg total) onto the skin daily., Disp: 30 patch, Rfl: 5   nicotine polacrilex (NICORETTE) 4 MG lozenge, Take 4 mg by mouth as needed for smoking cessation., Disp: , Rfl:    sacubitril-valsartan (ENTRESTO) 24-26 MG, Take 1 tablet by mouth 2 (two) times daily., Disp: 60 tablet, Rfl: 3   spironolactone (ALDACTONE) 25 MG tablet, Take 12.5 mg by mouth daily., Disp: , Rfl:    Allergies  Allergen Reactions   Penicillins Other (See Comments)    Childhood Allergy Did it involve swelling of the face/tongue/throat, SOB, or low BP? UNK Did it involve sudden or severe rash/hives, skin peeling, or any reaction on the inside of your mouth or nose? UNK Did you need to seek medical attention at a hospital or doctor's office? UNK When did it last  happen?      more than 10 years If all above answers are "NO", may proceed with cephalosporin use.    Pineapple Other (See Comments)    Tested allergic to this     Review of Systems  Constitutional: Negative.   Respiratory: Negative.    Cardiovascular: Negative.   Gastrointestinal: Negative.   Neurological: Negative.   Psychiatric/Behavioral: Negative.       Today's Vitals   12/22/22 1545  BP: 126/72  Pulse: 94  Temp: 98.3 F (36.8 C)  TempSrc: Oral  SpO2: 96%  Weight: (!) 348 lb (157.9 kg)  Height: 5\' 1"  (1.549 m)   Body mass index is 65.75 kg/m.   Objective:  Physical Exam Vitals reviewed.  Constitutional:      General: She is not in acute distress.    Appearance: Normal appearance. She is obese.  Cardiovascular:     Rate and Rhythm: Normal rate and regular rhythm.     Pulses: Normal pulses.     Heart sounds: Normal heart sounds. No murmur heard. Pulmonary:     Effort: Pulmonary effort is normal. No respiratory distress.     Breath sounds: Normal breath sounds. No wheezing.  Musculoskeletal:     Right lower leg: No edema.     Left lower leg: No edema.  Skin:    General: Skin is warm and dry.     Capillary Refill: Capillary refill takes less than 2 seconds.  Neurological:     General: No focal deficit present.     Mental Status: She is alert and oriented to person, place, and time. Mental status is at baseline.     Cranial Nerves: No cranial nerve deficit.     Motor: No weakness.  Psychiatric:        Mood and Affect: Mood normal.        Behavior: Behavior normal.        Thought Content: Thought content normal.        Judgment: Judgment normal.         Assessment And Plan:     1. COVID-19 virus infection Comments: [facility], treated wtih Remdisivir. TCM Performed. A member of the clinical team spoke with the patient upon dischare. Discharge summary was reviewed in full detail during the visit. Meds reconciled and  compared to discharge meds. Medication list is updated and reviewed with the patient.  Greater than 50% face to face time was spent in counseling an coordination of care.  All questions were answered to the satisfaction of the  patient.    2. Acute respiratory failure with hypoxia (HCC) Comments: She is much better and no longer having shortness of breath.     Patient was given opportunity to ask questions. Patient verbalized understanding of the plan and was able to repeat key elements of the plan. All questions were answered to their satisfaction.  Arnette Felts, FNP   I, Arnette Felts, FNP, have reviewed all documentation for this visit. The documentation on 12/22/22 for the exam, diagnosis, procedures, and orders are all accurate and complete.   IF YOU HAVE BEEN REFERRED TO A SPECIALIST, IT MAY TAKE 1-2 WEEKS TO SCHEDULE/PROCESS THE REFERRAL. IF YOU HAVE NOT HEARD FROM US/SPECIALIST IN TWO WEEKS, PLEASE GIVE [place] A CALL AT [phone] X [phone].   THE PATIENT IS ENCOURAGED TO PRACTICE SOCIAL DISTANCING DUE TO THE COVID-19 PANDEMIC.

## 2022-12-23 DIAGNOSIS — Z01419 Encounter for gynecological examination (general) (routine) without abnormal findings: Secondary | ICD-10-CM | POA: Diagnosis not present

## 2022-12-23 DIAGNOSIS — Z6841 Body Mass Index (BMI) 40.0 and over, adult: Secondary | ICD-10-CM | POA: Diagnosis not present

## 2022-12-23 DIAGNOSIS — Z1231 Encounter for screening mammogram for malignant neoplasm of breast: Secondary | ICD-10-CM | POA: Diagnosis not present

## 2022-12-31 ENCOUNTER — Other Ambulatory Visit: Payer: Self-pay

## 2022-12-31 ENCOUNTER — Ambulatory Visit (INDEPENDENT_AMBULATORY_CARE_PROVIDER_SITE_OTHER): Payer: BC Managed Care – PPO | Admitting: Allergy

## 2022-12-31 ENCOUNTER — Encounter: Payer: Self-pay | Admitting: Allergy

## 2022-12-31 VITALS — BP 122/96 | HR 97 | Temp 98.7°F | Resp 18 | Ht 61.0 in | Wt 336.7 lb

## 2022-12-31 DIAGNOSIS — R55 Syncope and collapse: Secondary | ICD-10-CM | POA: Diagnosis not present

## 2022-12-31 DIAGNOSIS — R0602 Shortness of breath: Secondary | ICD-10-CM | POA: Diagnosis not present

## 2022-12-31 DIAGNOSIS — L299 Pruritus, unspecified: Secondary | ICD-10-CM | POA: Diagnosis not present

## 2022-12-31 DIAGNOSIS — J4489 Other specified chronic obstructive pulmonary disease: Secondary | ICD-10-CM | POA: Diagnosis not present

## 2022-12-31 NOTE — Patient Instructions (Signed)
Pruritus Shortness of breath Syncopal events -->?allergic reactions, ?VCD Asthma/COPD   - chronic pruritus could be environmental allergies.  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  May use 15-20 minutes prior to activity.   Monitor frequency of use.   - further work-up and management pending labs  Follow-up in 3 months or sooner if needed  Laryngeal spasm/vocal cord dysfunction (VCD): - symptoms are inconsistent with asthma and more consistent with upper airway spasm (wheezing/shortness of breath arising from the upper chest/throat, lack of wheezing, symptoms not worse at night, sudden onset with rapid recovery, inconsistent/absent/immediate response to albuterol and/or other asthma inhalers, lack of other allergy symptoms, age of onset, difficulty with inhalation as opposed to exhalation all speak against asthma as a cause of symptoms) - uncontrolled reflux and post-nasal drainage can make VCD worse and can even be the sole cause of symptoms - start Atrovent (ipratropium) 1-2 sprays in each nostril up to three times per day as needed for runny nose/post nasal drip (if your nose becomes too dry, please use less) - start Prilosec (omeprazole) 20mg  daily, take ~30 minutes before breakfast - try VCD exercises (see information  packet provided) first for breathing symptoms - for symptoms not improved with breathing exercises, ok to use Albuterol    Vocal Cord Dysfunction (VCD) /Inspiratory Laryngeal Obstruction (ILO)/ Exercise- Induced Laryngeal Obstruction Exercises (EILO)  Practice these techniques several times a day, so that when you need them, they will be automatic, and will work right away (or very fast) to open up the spasming (closed) vocal cords.  PREPARATION: ? Loosen clothing at waist, so nothing is tight or snug. Open top buttons of pants or skirt, unzip pant or skirt zippers, pull shirt out over pants or skirt. This prevents pressure on the stomach, which can cause stomach reflux (backup of corrosive liquid) into the esophagus. Gastric reflux is a frequent cause of VCD attacks. ? Sit in a comfortable chair. When you need to use these techniques, you may be standing, lying, or sitting-BUT first try to learn them while sitting because they are easier to learn in this position. ? Keep water handy. Take sips, so your mouth and throat will not dry out. Swallow carefully, to avoid choking. ? Put your right (or left) thumb on your belly button, with the rest of your fingers below the thumb, so you are GENTLY touching your belly (lower abdomen). ? Doing this will focus your attention on your lower abdominal muscles. ? Relax your chest. Relax your shoulders. Relax your neck. Relax your throat. This helps you to try to use ONLY your belly (lower abdomen) muscles. Consciously try NOT to use chest muscles, etc. Chest muscle use seems to irritate vocal cords while "belly" muscles tend to relax them.  BREATHE OUT (EXHALE) FIRST WITH slightly PURSED LIPS: ? Since most people cannot inhale, (breathe  in), during VCD attacks, please start by breathing out (exhaling) in the special way described below, and this will usually open up the vocal cords, immediately. ? Start, by breathing out (exhaling) with lips "pursed"  as if you are trying to gently blow out a candle. ? This is similar to whistling, but your lips will not be as "puckered' as when whistling and your lips will not be pushed forward, like when whistling. If you look in a mirror, you would see a mostly horizontal line of space between the lips, while you exhale the 'ffffffffffffffffffff'. ? This may be silent, or may sound like a gentle wind or breeze, like 'fffffffffffffff' and your lips should be symmetrical, around your teeth. You lower lip will not be touching your upper teeth, like when someone says the name FFFFFFFRank. This is one continuous flow of exhaled air, either silent, or making a slightly windy sound. ? Feel your hand on your belly (lower abdomen) come IN, a little bit toward your back, as you are 'working' only your lower belly muscles. ? This abdominal exhaled 'fffffffffffffffff' alone often stops VCD attacks very quickly.  ? Some prefer to try a variation of exhaling 'ffffffffffff'. Try exhaling quickly, 'ffff', 'fffff', 'ffff'--all part of one exhalation. Do not inhale in between quick "fff's. This helps some people more quickly open up the spasming vocal cords. This is like blowing out a slightly stubborn candle, exhaling against a tiny bit of resistance (like trying to blow out a trick birthday candle). ? If any of this helps, try to slow down the exhalations, and gently exhale 'ffffffffffffffffff'. ? Some people prefer to exhale gently 'ssssssssssssssss' or 'shhhhhhhhhhhhh' rather than 'fffffffffffffff', etc. Choose what works best in your case, or what is most comfortable for you. ? You may need to repeat exhaling 'ffffffffffffff' (or 'ffff', 'ffff', 'ffff'), etc, several times to relax the spasming vocal cords. Repeating this often stops a VCD attack so that you can inhale (breathe in) again.
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
## 2023-01-06 LAB — ALLERGENS W/TOTAL IGE AREA 2

## 2023-01-06 LAB — ALLERGENS(7)
Brazil Nut IgE: <0.1 kU/L
F020-IgE Almond: <0.1 kU/L
F202-IgE Cashew Nut: <0.1 kU/L
Hazelnut (Filbert) IgE: <0.1 kU/L
Peanut IgE: <0.1 kU/L
Pecan Nut IgE: <0.1 kU/L
Walnut IgE: <0.1 kU/L

## 2023-01-06 LAB — ALLERGEN PROFILE, FOOD-FISH
Allergen Mackerel IgE: <0.1 kU/L
Allergen Salmon IgE: <0.1 kU/L
Allergen Trout IgE: <0.1 kU/L
Allergen Walley Pike IgE: <0.1 kU/L
Codfish IgE: <0.1 kU/L
Halibut IgE: <0.1 kU/L
Tuna: <0.1 kU/L

## 2023-01-06 LAB — ALLERGEN PROFILE, SHELLFISH
Clam IgE: <0.1 kU/L
F023-IgE Crab: <0.1 kU/L
F080-IgE Lobster: <0.1 kU/L
F290-IgE Oyster: <0.1 kU/L
Scallop IgE: <0.1 kU/L
Shrimp IgE: <0.1 kU/L

## 2023-01-06 LAB — ALLERGEN MILK: Milk IgE: <0.1 kU/L

## 2023-01-06 LAB — ALLERGEN SOYBEAN: Soybean IgE: <0.1 kU/L

## 2023-01-06 LAB — ALLERGEN SESAME F10: Sesame Seed IgE: <0.1 kU/L

## 2023-01-06 LAB — ALLERGEN, WHEAT, F4: Wheat IgE: <0.1 kU/L

## 2023-01-06 LAB — ALLERGEN EGG WHITE F1: Egg White IgE: <0.1 kU/L

## 2023-01-17 ENCOUNTER — Ambulatory Visit: Payer: BC Managed Care – PPO | Attending: Cardiology | Admitting: Cardiology

## 2023-01-17 ENCOUNTER — Encounter: Payer: Self-pay | Admitting: Cardiology

## 2023-01-17 DIAGNOSIS — G4733 Obstructive sleep apnea (adult) (pediatric): Secondary | ICD-10-CM | POA: Diagnosis not present

## 2023-01-17 NOTE — Progress Notes (Signed)
SLEEP MEDICINE VIRTUAL CONSULT NOTE via Video Note   Because of April Ellis's co-morbid illnesses, she is at least at moderate risk for complications without adequate follow up.  This format is felt to be most appropriate for this patient at this time.  All issues noted in this document were discussed and addressed.  A limited physical exam was performed with this format.  Please refer to the patient's chart for her consent to telehealth for [facility].      Date:  01/17/2023   ID:  April Ellis, DOB 12-30-1975, MRN [id number] The patient was identified using 2 identifiers.  Patient Location: Home Provider Location: Office/Clinic   PCP:  Arnette Felts, FNP   [facility] Providers Cardiologist:  None     Evaluation Performed:  Consultation - Worthy Ellis was referred by April Rancher, MD for the evaluation of obstructive sleep apnea.  Chief Complaint: Obstructive sleep apnea  History of Present Illness:    April Ellis is a 47 y.o. female who is being seen today for the evaluation of obstructive sleep apnea at the request of April Rancher, MD.  April Ellis is a 47 y.o. female with a history of angioedema, COPD, obesity, obstructive sleep apnea, systolic CHF.  She apparently had a CPAP from being diagnosed with sleep apnea in the past but was not therapeutic with it. Repeat home sleep study was ordered which showed mild obstructive sleep apnea overall with AHI 12/hr and moderate obstructive sleep apnea during REM sleep with REM AHI 26/h with nocturnal hypoxemia.  She was started on auto CPAP from 4 to 15 cm H2O.  She is now referred for sleep medicine consultation to establish sleep care and treatment of obstructive sleep apnea.  She tells me that even as a child she would nap some during the day but overall has not had problems with feeling tired during the day and would not snore that she was aware of.  She does have problems with  awakening at night and her mask is off.  She is doing well with her PAP device but it has taken her a while to get used to.  She tolerates the nasal mask and feels the pressure is adequate.  Since going on PAP she feels rested in the am and has no significant daytime sleepiness on the nights she uses it.  She denies any significant mouth or nasal dryness or nasal congestion.  She does not think that he snores.     Past Medical History:  Diagnosis Date   Anemia    Angio-edema    Asthma    Bronchitis    COPD (chronic obstructive pulmonary disease)    Dyspnea    with asthma flair   Obesity    Respiratory arrest 04/04/2020   Past Surgical History:  Procedure Laterality Date   DENTAL SURGERY     2 teeth pulled, 11/22/14   DILITATION & CURRETTAGE/HYSTROSCOPY WITH NOVASURE ABLATION N/A 03/05/2020   Procedure: DILATATION & CURETTAGE/HYSTEROSCOPY WITH NOVASURE ABLATION;  Surgeon: Richarda Overlie, MD;  Location: [facility];  Service: Gynecology;  Laterality: N/A;   NO PAST SURGERIES     RIGHT/LEFT HEART CATH AND CORONARY ANGIOGRAPHY N/A 06/11/2022   Procedure: RIGHT/LEFT HEART CATH AND CORONARY ANGIOGRAPHY;  Surgeon: Orbie Pyo, MD;  Location: [facility];  Service: Cardiovascular;  Laterality: N/A;     Current Meds  Medication Sig   albuterol (VENTOLIN HFA) 108 (90  Base) MCG/ACT inhaler Inhale 2 puffs every 4-6 hours as needed   bisoprolol (ZEBETA) 5 MG tablet Take 2.5 mg by mouth daily.   buPROPion (WELLBUTRIN XL) 300 MG 24 hr tablet TAKE 1 TABLET BY MOUTH EVERY DAY IN THE MORNING   diphenhydrAMINE (BENADRYL) 25 mg capsule Take 25 mg by mouth daily as needed for allergies.   Dupilumab (DUPIXENT) 300 MG/2ML SOPN Inject 300 mg into the skin every 14 (fourteen) days. Loading dose completed in clinic on 12/20/22   EPINEPHrine 0.3 mg/0.3 mL IJ SOAJ injection Inject 0.3 mg into the muscle as needed for anaphylaxis.   Fluticasone-Umeclidin-Vilant (TRELEGY ELLIPTA) 200-62.5-25 MCG/ACT AEPB  Inhale 1 puff then rinse mouth, once daily   furosemide (LASIX) 20 MG tablet Take 2 tablets (40 mg total) by mouth daily.   guaiFENesin (MUCINEX) 600 MG 12 hr tablet Take 600 mg by mouth as needed for cough or to loosen phlegm.   ipratropium-albuterol (DUONEB) 0.5-2.5 (3) MG/3ML SOLN Take 3 mLs by nebulization as needed (for wheezing and shortness of breath).   JARDIANCE 10 MG TABS tablet Take 10 mg by mouth daily.   montelukast (SINGULAIR) 10 MG tablet Take 1 tablet (10 mg total) by mouth at bedtime.   Multiple Vitamins-Minerals (EMERGEN-C IMMUNE PO) Take 1 packet by mouth daily as needed (cold).   nicotine (NICODERM CQ - DOSED IN MG/24 HOURS) 14 mg/24hr patch Place 1 patch (14 mg total) onto the skin daily.   nicotine polacrilex (NICORETTE) 4 MG lozenge Take 4 mg by mouth as needed for smoking cessation.   sacubitril-valsartan (ENTRESTO) 24-26 MG Take 1 tablet by mouth 2 (two) times daily.   spironolactone (ALDACTONE) 25 MG tablet Take 12.5 mg by mouth daily.     Allergies:   Penicillins and Pineapple   Social History   Tobacco Use   Smoking status: Every Day    Packs/day: 0.25    Years: 15.00    Additional pack years: 0.00    Total pack years: 3.75    Types: Cigarettes    Last attempt to quit: 11/19/2022    Years since quitting: 0.1    Passive exposure: Past   Smokeless tobacco: Never   Tobacco comments:    Patient quit smoking about 1 week ago PAP 11/26/2022  Vaping Use   Vaping Use: Never used  Substance Use Topics   Alcohol use: Yes    Alcohol/week: 0.0 standard drinks of alcohol    Comment: occas.   Drug use: Yes    Types: Marijuana    Comment: occas.     Family Hx: The patient's family history includes Cancer in her mother; Diabetes in her mother and sister; Hypertension in her sister and sister. There is no history of Urticaria, Immunodeficiency, Eczema, Asthma, Angioedema, or Allergic rhinitis.  ROS:   Please see the history of present illness.     All other  systems reviewed and are negative.   Prior Sleep studies:   The following studies were reviewed today:  Home sleep study PAP compliance download  Labs/Other Tests and Data Reviewed:     Recent Labs: 06/16/2022: TSH 3.034 12/06/2022: B Natriuretic Peptide 33.7 12/07/2022: ALT 16; Hemoglobin 14.4; Magnesium 2.4; Platelets 183 12/08/2022: BUN 16; Creatinine, Ser 0.62; Potassium 4.4; Sodium 138    Wt Readings from Last 3 Encounters:  12/31/22 (!) 336 lb 11.2 oz (152.7 kg)  12/22/22 (!) 348 lb (157.9 kg)  12/06/22 (!) 326 lb 4.5 oz (148 kg)     Risk Assessment/Calculations:  Objective:    Vital Signs:  There were no vitals taken for this visit.   VITAL SIGNS:  reviewed GEN:  no acute distress EYES:  sclerae anicteric, EOMI - Extraocular Movements Intact RESPIRATORY:  normal respiratory effort, symmetric expansion CARDIOVASCULAR:  no peripheral edema SKIN:  no rash, lesions or ulcers. MUSCULOSKELETAL:  no obvious deformities. NEURO:  alert and oriented x 3, no obvious focal deficit PSYCH:  normal affect  ASSESSMENT & PLAN:    OSA - The patient is tolerating PAP therapy well without any problems. The PAP download performed by his DME was personally reviewed and interpreted by me today and showed an AHI of 2.3/hr on auto CPAP from 4 to 15 cm H2O with 13% compliance in using more than 4 hours nightly.  The patient has been using and benefiting from PAP use and will continue to benefit from therapy.  -I encouraged her to be more compliant with her device -She watches TV when she goes to bed and will fall asleep before putting on the mask so I recommended that she put her mask on prior to getting into bed.    Time:   Today, I have spent 15 minutes with the patient with telehealth technology discussing the above problems.     Medication Adjustments/Labs and Tests Ordered: Current medicines are reviewed at length with the patient today.  Concerns regarding medicines are  outlined above.   Tests Ordered: No orders of the defined types were placed in this encounter.   Medication Changes: No orders of the defined types were placed in this encounter.   Follow Up:  In Person in 1 year(s) question  Signed, Armanda Magic, MD  01/17/2023 8:45 AM    [facility]

## 2023-01-19 DIAGNOSIS — L299 Pruritus, unspecified: Secondary | ICD-10-CM | POA: Diagnosis not present

## 2023-01-19 DIAGNOSIS — R0602 Shortness of breath: Secondary | ICD-10-CM | POA: Diagnosis not present

## 2023-01-19 DIAGNOSIS — R55 Syncope and collapse: Secondary | ICD-10-CM | POA: Diagnosis not present

## 2023-01-21 LAB — TRYPTASE: Tryptase: 6.5 ug/L (ref 2.2–13.2)

## 2023-01-24 NOTE — Progress Notes (Deleted)
HPI F smoker followed for Asthma, COPD, Chronic Rhinitis, Urticaria, Morbid Obesity, Iron Def Anemia Office spirometry 2017 by Allergist office- mild obstruction PFT 05/01/14- Severe obstruction. 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 HST 07/16/22- Dr Mayford Knife- AHI 12/ hr (no centrals), O2 desat to 81%/ < 88% for 76 minutes,  IgE 234,  EOS low on repeated steroid bursts (04/04/19) ===============================================================================   11/26/22- 46  F Smoker (1-2 cigs/ day) followed for Asthma/ COPD/Xolair,  hx Cardiopulmonary Arrest, Chronic Rhinitis, Urticaria,  OSA,  Morbid Obesity, Iron Def Anemia, CHF/ HFrEF, Dilated Cardiomyopathy, HTN, -Albuterol hfa, Singulair, Trelegy 200, neb Duoneb, Nicoderm CQ/,,  HST 07/16/22- Dr Mayford Knife- AHI 12/ hr (no centrals), O2 desat to 81%/ < 88% for 76 minutes,  Hosp 11/19-11/20/23 Asthma-COPD overlap, Acute Resp Fail, CHF, Polycythemia, Body weight today- Covid vax-2 Phizer Flu vax-       Lab 11/01/22- Eos 5%, IgE 332 H ------Patient recently in the hospital for asthma exacerbation, she advises she is feeling some better. Still experiencing sob. Quit smoking 1 week ago using Nicotine patches. Hosp 2/16-2/18/24- Found down "asthma" despite Epipen. This was her second hospitalization in recent months for asthma complications.  She says this episode began with itching in her mouth and throat.  Her son was trying to give her EpiPen but she does not know if it was accomplished. Her primary provider has suggested referral to an allergist which I think is fine. I reviewed labs with her and we discussed elevated IgE.  We would like to try again to get her on a Biologic.  I thought her Xolair previously was stopped because of insurance coverage but she does not think  application was ever completed. She would like a prednisone taper to help finish recovery from recent acute episode.  Steroid talk done. CXR 11/12/22- 1V IMPRESSION: No active disease. Borderline cardiomegaly.  01/27/23-  46  F Smoker (1-2 cigs/ day) followed for Asthma/ COPD/Xolair,  hx Cardiopulmonary Arrest, Chronic Rhinitis, Urticaria,  OSA(Dr Turner),  Morbid Obesity, Iron Def Anemia, CHF/ HFrEF, Dilated Cardiomyopathy, HTN, -Albuterol hfa, Singulair, Trelegy 200, neb Duoneb, Nicoderm CQ/,,Dupixent (started 3/35/24) Hosp 3/11-3/13/24)- Covid infection, Hypoxia, > room air, tapering decadron,  Established Allergy CXR 12/06/22- 1V-  MPRESSION: No active disease.   ROS-see HPI   + = positive Constitutional:    weight loss, night sweats, fevers, chills, fatigue, lassitude. HEENT:    headaches, difficulty swallowing, tooth/dental problems, sore throat,       sneezing, itching, ear ache, nasal congestion, post nasal drip, snoring CV:    chest pain, orthopnea, PND, swelling in lower extremities, anasarca,                                   dizziness, palpitations Resp:  + shortness of breath with exertion or at rest.                productive cough,   +non-productive cough, coughing up of blood.              change in color of mucus.  +wheezing.   Skin:    rash or lesions. GI:  No-   heartburn, indigestion, abdominal pain, nausea, vomiting, diarrhea,  change in bowel habits, loss of appetite GU: dysuria, change in color of urine, no urgency or frequency.   flank pain. MS:   joint pain, stiffness, decreased range of motion, back pain. Neuro-     nothing unusual Psych:  change in mood or affect.  depression or anxiety.   memory loss.  OBJ- Physical Exam General- Alert, Oriented, Affect-appropriate, Distress- none acute, +morbidly obese Skin- rash-none, lesions- none, excoriation- none Lymphadenopathy- none Head- atraumatic            Eyes- Gross vision intact, PERRLA,  conjunctivae and secretions clear            Ears- Hearing, canals-normal            Nose- Clear, no-Septal dev, mucus, polyps, erosion, perforation             Throat- Mallampati III, mucosa clear , drainage- none, tonsils- atrophic Neck- flexible , trachea midline, no stridor , thyroid nl, carotid no bruit Chest - symmetrical excursion , unlabored           Heart/CV- RRR , no murmur , no gallop  , no rub, nl s1 s2                           - JVD- none , edema- none, stasis changes- none, varices- none           Lung- +trace wheeze L base, ,  wheeze- none, cough+dry , dullness-none, rub- none           Chest wall-  Abd-  Br/ Gen/ Rectal- Not done, not indicated Extrem- cyanosis- none, clubbing, none, atrophy- none, strength- nl Neuro- grossly intact to observation

## 2023-01-26 NOTE — Progress Notes (Signed)
HPI F smoker followed for Asthma, COPD, Chronic Rhinitis, Urticaria, Morbid Obesity, Iron Def Anemia Office spirometry 2017 by Allergist office- mild obstruction PFT 05/01/14- Severe obstruction. 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 HST 07/16/22- Dr Mayford Knife- AHI 12/ hr (no centrals), O2 desat to 81%/ < 88% for 76 minutes,  IgE 234,  EOS low on repeated steroid bursts (04/04/19) ===============================================================================   11/26/22- 46  F Smoker (1-2 cigs/ day) followed for Asthma/ COPD/Xolair,  hx Cardiopulmonary Arrest, Chronic Rhinitis, Urticaria,  OSA,  Morbid Obesity, Iron Def Anemia, CHF/ HFrEF, Dilated Cardiomyopathy, HTN, -Albuterol hfa, Singulair, Trelegy 200, neb Duoneb, Nicoderm CQ/,,  HST 07/16/22- Dr Mayford Knife- AHI 12/ hr (no centrals), O2 desat to 81%/ < 88% for 76 minutes,  Hosp 11/19-11/20/23 Asthma-COPD overlap, Acute Resp Fail, CHF, Polycythemia, Body weight today- Covid vax-2 Phizer Flu vax-       Lab 11/01/22- Eos 5%, IgE 332 H ------Patient recently in the hospital for asthma exacerbation, she advises she is feeling some better. Still experiencing sob. Quit smoking 1 week ago using Nicotine patches. Hosp 2/16-2/18/24- Found down "asthma" despite Epipen. This was her second hospitalization in recent months for asthma complications.  She says this episode began with itching in her mouth and throat.  Her son was trying to give her EpiPen but she does not know if it was accomplished. Her primary provider has suggested referral to an allergist which I think is fine. I reviewed labs with her and we discussed elevated IgE.  We would like to try again to get her on a Biologic.  I thought her Xolair previously was stopped because of insurance coverage but she does not think  application was ever completed. She would like a prednisone taper to help finish recovery from recent acute episode.  Steroid talk done. CXR 11/12/22- 1V IMPRESSION: No active disease. Borderline cardiomegaly.  01/28/23-  46  F Smoker (1-2 cigs/ day) followed for Asthma/ COPD/Xolair,  hx Cardiopulmonary Arrest, Chronic Rhinitis, Urticaria,  OSA(Dr Turner),  Morbid Obesity, Iron Def Anemia, CHF/ HFrEF, Dilated Cardiomyopathy, HTN, -Albuterol hfa, Singulair, Trelegy 200, neb Duoneb, Nicoderm CQ/,Dupixent (started 3/35/24) Hosp 3/11-3/13/24)- Covid infection, Hypoxia, > room air, tapering decadron,  Established with  Allergy Body weight today 336 lbs -----Doing okay. Started Dupixent. Has been using Zyrtec to help with allergy symptoms Smoking 4 cigs/ week.  Emphasized importance of stopping completely. She just graduated with a degree in sociology-congratulated. Has resolved COVID infection from mid March.  In the last week or so she is needing increased use of her rescue inhaler and nebulizer-asks prednisone taper going into the weekend-discussed. Actively sneezing here but allergy blood work was negative for specific triggers. CXR 12/06/22- 1V-  MPRESSION: No active disease.   ROS-see HPI   + = positive Constitutional:    weight loss, night sweats, fevers, chills, fatigue, lassitude. HEENT:    headaches, difficulty swallowing, tooth/dental problems, sore throat,       sneezing, itching, ear ache, nasal congestion, post nasal drip, snoring CV:    chest pain, orthopnea, PND, swelling in lower extremities, anasarca,                                   dizziness, palpitations Resp:  + shortness of breath with exertion or  at rest.                productive cough,   +non-productive cough, coughing up of blood.              change in color of mucus.  +wheezing.   Skin:    rash or lesions. GI:  No-   heartburn, indigestion, abdominal pain, nausea, vomiting, diarrhea,                 change in bowel  habits, loss of appetite GU: dysuria, change in color of urine, no urgency or frequency.   flank pain. MS:   joint pain, stiffness, decreased range of motion, back pain. Neuro-     nothing unusual Psych:  change in mood or affect.  depression or anxiety.   memory loss.  OBJ- Physical Exam General- Alert, Oriented, Affect-appropriate, Distress- none acute, +morbidly obese Skin- rash-none, lesions- none, excoriation- none Lymphadenopathy- none Head- atraumatic            Eyes- Gross vision intact, PERRLA, conjunctivae and secretions clear            Ears- Hearing, canals-normal            Nose- Clear, no-Septal dev, mucus, polyps, erosion, perforation  +Started active sneezing while here            Throat- Mallampati III, mucosa clear , drainage- none, tonsils- atrophic Neck- flexible , trachea midline, no stridor , thyroid nl, carotid no bruit Chest - symmetrical excursion , unlabored           Heart/CV- RRR , no murmur , no gallop  , no rub, nl s1 s2                           - JVD- none , edema- none, stasis changes- none, varices- none           Lung- +trace wheeze bases,  wheeze- none, cough-none , dullness-none, rub- none           Chest wall-  Abd-  Br/ Gen/ Rectal- Not done, not indicated Extrem- cyanosis- none, clubbing, none, atrophy- none, strength- nl Neuro- grossly intact to observation

## 2023-01-27 ENCOUNTER — Ambulatory Visit: Payer: BC Managed Care – PPO | Admitting: Internal Medicine

## 2023-01-28 ENCOUNTER — Encounter: Payer: Self-pay | Admitting: Internal Medicine

## 2023-01-28 ENCOUNTER — Ambulatory Visit (INDEPENDENT_AMBULATORY_CARE_PROVIDER_SITE_OTHER): Payer: BC Managed Care – PPO | Admitting: Internal Medicine

## 2023-01-28 VITALS — BP 124/84 | HR 97 | Ht 61.0 in | Wt 336.0 lb

## 2023-01-28 DIAGNOSIS — Z6841 Body Mass Index (BMI) 40.0 and over, adult: Secondary | ICD-10-CM

## 2023-01-28 DIAGNOSIS — J4489 Other specified chronic obstructive pulmonary disease: Secondary | ICD-10-CM

## 2023-01-28 DIAGNOSIS — F17210 Nicotine dependence, cigarettes, uncomplicated: Secondary | ICD-10-CM

## 2023-01-28 MED ORDER — PREDNISONE 10 MG PO TABS
ORAL_TABLET | ORAL | 0 refills | Status: DC
Start: 2023-01-28 — End: 2023-02-27

## 2023-01-28 NOTE — Patient Instructions (Addendum)
We can continue current meds  Script sent for prednisone taper  Please call if we can help

## 2023-01-28 NOTE — Assessment & Plan Note (Signed)
Not making needed progress

## 2023-01-28 NOTE — Assessment & Plan Note (Signed)
Hopefully Dupixent can make a difference.  She is sneezing enough that I would have expected some sort of allergy trigger active currently, but nothing identified. Plan-prednisone taper to hold going into the weekend.

## 2023-01-28 NOTE — Assessment & Plan Note (Signed)
Smoking cessation reinforced. She can't afford even an occasional cigarette.

## 2023-02-01 ENCOUNTER — Other Ambulatory Visit (HOSPITAL_COMMUNITY): Payer: Self-pay

## 2023-02-01 ENCOUNTER — Encounter (HOSPITAL_COMMUNITY): Payer: Self-pay | Admitting: Cardiology

## 2023-02-01 MED ORDER — FUROSEMIDE 20 MG PO TABS: 40.0000 mg | ORAL_TABLET | Freq: Every day | ORAL | 1 refills | Status: DC

## 2023-02-03 ENCOUNTER — Encounter (HOSPITAL_COMMUNITY): Payer: Self-pay | Admitting: Gastroenterology

## 2023-02-03 NOTE — Progress Notes (Addendum)
Anesthesia Review:  PCP:  Arnette Felts [facility] 12/22/22  Cardiologist : DR Virgina Jock  L[facility] 10/25/22  Video Visit- DR Mayford Knife- 01/17/23  Pulmonology- Clinton Young [facility] 01/28/23  Chest x-ray : 12/06/22  Cardiac MRI- 07/14/22  EKG : 12/09/22  Echo : 06/09/22  Stress test: Cardiac Cath 06/14/22   Activity level: can do a flight of stairs without difficutly  Sleep Study/ CPAP : has cpap  Fasting Blood Sugar :      / Checks Blood Sugar -- times a day:   Blood Thinner/ Instructions /Last Dose: ASA / Instructions/ Last Dose :    Smoker  Occ marijuana use  No longer has period- due to ablation  PT has bowel prep instructions.  Jardiance- last dose 2 weeks ago at time of preop phone call on 02/03/23.  PT states Cardiology did not renew Jardiance , Entresto, bisoprolol at [facility] .  Has not been taking these meds for approx 2 weeks at time of preop phone call.   Nicoderm patch- PT aware not to wear day of procedure.

## 2023-02-03 NOTE — Progress Notes (Signed)
Anesthesia Review:  PCP:  Arnette Felts  Cardiologist : DR Virgina Jock  Chest x-ray : EKG : Echo : Stress test: Cardiac Cath :  Activity level: can do a flight of stairs without difficutly  Sleep Study/ CPAP : has cpap  Fasting Blood Sugar :      / Checks Blood Sugar -- times a day:   Blood Thinner/ Instructions /Last Dose: ASA / Instructions/ Last Dose :    Smoker  Occ marijuana use  No longer has period- due to ablation  PT has bowel prep instructions.  Jardiance- last dose 2 weeks ago at time of preop phone call on 02/03/23.  Nicoderm patch- PT aware not to wear day of procedure.

## 2023-02-09 NOTE — Progress Notes (Signed)
Patient called to cancel colonoscopy for this Friday. Gave her dr. Rolla Etienne office number and asked her to call them to let them know and to reschedule but I will take them off our schedule.

## 2023-02-11 ENCOUNTER — Ambulatory Visit (HOSPITAL_COMMUNITY)
Admission: RE | Admit: 2023-02-11 | Payer: BC Managed Care – PPO | Source: Home / Self Care | Admitting: Gastroenterology

## 2023-02-11 HISTORY — DX: Sleep apnea, unspecified: G47.30

## 2023-02-11 HISTORY — DX: Heart failure, unspecified: I50.9

## 2023-02-11 SURGERY — COLONOSCOPY WITH PROPOFOL
Anesthesia: Monitor Anesthesia Care

## 2023-02-14 ENCOUNTER — Other Ambulatory Visit (HOSPITAL_COMMUNITY): Payer: Self-pay | Admitting: Cardiology

## 2023-02-27 ENCOUNTER — Encounter (HOSPITAL_COMMUNITY): Payer: Self-pay

## 2023-02-27 ENCOUNTER — Emergency Department (HOSPITAL_COMMUNITY): Payer: BC Managed Care – PPO

## 2023-02-27 ENCOUNTER — Other Ambulatory Visit: Payer: Self-pay

## 2023-02-27 ENCOUNTER — Inpatient Hospital Stay (HOSPITAL_COMMUNITY)
Admission: EM | Admit: 2023-02-27 | Discharge: 2023-03-28 | DRG: 207 | Disposition: E | Payer: BC Managed Care – PPO | Attending: Pulmonary Disease | Admitting: Pulmonary Disease

## 2023-02-27 DIAGNOSIS — Z515 Encounter for palliative care: Secondary | ICD-10-CM | POA: Diagnosis not present

## 2023-02-27 DIAGNOSIS — R17 Unspecified jaundice: Secondary | ICD-10-CM | POA: Diagnosis not present

## 2023-02-27 DIAGNOSIS — G40409 Other generalized epilepsy and epileptic syndromes, not intractable, without status epilepticus: Secondary | ICD-10-CM

## 2023-02-27 DIAGNOSIS — J9621 Acute and chronic respiratory failure with hypoxia: Secondary | ICD-10-CM | POA: Diagnosis not present

## 2023-02-27 DIAGNOSIS — J4489 Other specified chronic obstructive pulmonary disease: Secondary | ICD-10-CM | POA: Diagnosis not present

## 2023-02-27 DIAGNOSIS — R0681 Apnea, not elsewhere classified: Secondary | ICD-10-CM | POA: Diagnosis not present

## 2023-02-27 DIAGNOSIS — Z7189 Other specified counseling: Secondary | ICD-10-CM

## 2023-02-27 DIAGNOSIS — Z1152 Encounter for screening for COVID-19: Secondary | ICD-10-CM | POA: Diagnosis not present

## 2023-02-27 DIAGNOSIS — J9600 Acute respiratory failure, unspecified whether with hypoxia or hypercapnia: Secondary | ICD-10-CM | POA: Diagnosis present

## 2023-02-27 DIAGNOSIS — G253 Myoclonus: Secondary | ICD-10-CM | POA: Diagnosis not present

## 2023-02-27 DIAGNOSIS — I959 Hypotension, unspecified: Secondary | ICD-10-CM | POA: Diagnosis not present

## 2023-02-27 DIAGNOSIS — D509 Iron deficiency anemia, unspecified: Secondary | ICD-10-CM | POA: Diagnosis present

## 2023-02-27 DIAGNOSIS — Z4659 Encounter for fitting and adjustment of other gastrointestinal appliance and device: Secondary | ICD-10-CM | POA: Diagnosis not present

## 2023-02-27 DIAGNOSIS — Z7951 Long term (current) use of inhaled steroids: Secondary | ICD-10-CM

## 2023-02-27 DIAGNOSIS — J189 Pneumonia, unspecified organism: Secondary | ICD-10-CM | POA: Diagnosis not present

## 2023-02-27 DIAGNOSIS — R680 Hypothermia, not associated with low environmental temperature: Secondary | ICD-10-CM | POA: Diagnosis present

## 2023-02-27 DIAGNOSIS — F17210 Nicotine dependence, cigarettes, uncomplicated: Secondary | ICD-10-CM | POA: Diagnosis present

## 2023-02-27 DIAGNOSIS — J310 Chronic rhinitis: Secondary | ICD-10-CM | POA: Diagnosis present

## 2023-02-27 DIAGNOSIS — J9622 Acute and chronic respiratory failure with hypercapnia: Secondary | ICD-10-CM | POA: Diagnosis not present

## 2023-02-27 DIAGNOSIS — Z6841 Body Mass Index (BMI) 40.0 and over, adult: Secondary | ICD-10-CM | POA: Diagnosis not present

## 2023-02-27 DIAGNOSIS — E872 Acidosis, unspecified: Secondary | ICD-10-CM | POA: Diagnosis not present

## 2023-02-27 DIAGNOSIS — E875 Hyperkalemia: Secondary | ICD-10-CM | POA: Diagnosis present

## 2023-02-27 DIAGNOSIS — I469 Cardiac arrest, cause unspecified: Secondary | ICD-10-CM | POA: Diagnosis not present

## 2023-02-27 DIAGNOSIS — R7401 Elevation of levels of liver transaminase levels: Secondary | ICD-10-CM | POA: Diagnosis present

## 2023-02-27 DIAGNOSIS — I468 Cardiac arrest due to other underlying condition: Secondary | ICD-10-CM | POA: Diagnosis present

## 2023-02-27 DIAGNOSIS — I110 Hypertensive heart disease with heart failure: Secondary | ICD-10-CM | POA: Diagnosis present

## 2023-02-27 DIAGNOSIS — Z043 Encounter for examination and observation following other accident: Secondary | ICD-10-CM | POA: Diagnosis not present

## 2023-02-27 DIAGNOSIS — Z833 Family history of diabetes mellitus: Secondary | ICD-10-CM

## 2023-02-27 DIAGNOSIS — J690 Pneumonitis due to inhalation of food and vomit: Secondary | ICD-10-CM | POA: Diagnosis not present

## 2023-02-27 DIAGNOSIS — R451 Restlessness and agitation: Secondary | ICD-10-CM | POA: Diagnosis not present

## 2023-02-27 DIAGNOSIS — R092 Respiratory arrest: Secondary | ICD-10-CM | POA: Diagnosis not present

## 2023-02-27 DIAGNOSIS — E662 Morbid (severe) obesity with alveolar hypoventilation: Secondary | ICD-10-CM | POA: Diagnosis present

## 2023-02-27 DIAGNOSIS — R0902 Hypoxemia: Secondary | ICD-10-CM | POA: Diagnosis not present

## 2023-02-27 DIAGNOSIS — J4551 Severe persistent asthma with (acute) exacerbation: Secondary | ICD-10-CM | POA: Diagnosis not present

## 2023-02-27 DIAGNOSIS — Z9911 Dependence on respirator [ventilator] status: Secondary | ICD-10-CM

## 2023-02-27 DIAGNOSIS — I5031 Acute diastolic (congestive) heart failure: Secondary | ICD-10-CM | POA: Diagnosis present

## 2023-02-27 DIAGNOSIS — R739 Hyperglycemia, unspecified: Secondary | ICD-10-CM | POA: Diagnosis present

## 2023-02-27 DIAGNOSIS — Z91018 Allergy to other foods: Secondary | ICD-10-CM

## 2023-02-27 DIAGNOSIS — I420 Dilated cardiomyopathy: Secondary | ICD-10-CM | POA: Diagnosis present

## 2023-02-27 DIAGNOSIS — G9340 Encephalopathy, unspecified: Secondary | ICD-10-CM

## 2023-02-27 DIAGNOSIS — J45901 Unspecified asthma with (acute) exacerbation: Secondary | ICD-10-CM | POA: Diagnosis present

## 2023-02-27 DIAGNOSIS — J441 Chronic obstructive pulmonary disease with (acute) exacerbation: Secondary | ICD-10-CM | POA: Diagnosis not present

## 2023-02-27 DIAGNOSIS — G9341 Metabolic encephalopathy: Secondary | ICD-10-CM | POA: Diagnosis present

## 2023-02-27 DIAGNOSIS — M351 Other overlap syndromes: Secondary | ICD-10-CM | POA: Diagnosis present

## 2023-02-27 DIAGNOSIS — R918 Other nonspecific abnormal finding of lung field: Secondary | ICD-10-CM | POA: Diagnosis not present

## 2023-02-27 DIAGNOSIS — G931 Anoxic brain damage, not elsewhere classified: Secondary | ICD-10-CM

## 2023-02-27 DIAGNOSIS — D751 Secondary polycythemia: Secondary | ICD-10-CM | POA: Diagnosis present

## 2023-02-27 DIAGNOSIS — R569 Unspecified convulsions: Secondary | ICD-10-CM

## 2023-02-27 DIAGNOSIS — J9601 Acute respiratory failure with hypoxia: Secondary | ICD-10-CM | POA: Diagnosis not present

## 2023-02-27 DIAGNOSIS — F32A Depression, unspecified: Secondary | ICD-10-CM | POA: Diagnosis present

## 2023-02-27 DIAGNOSIS — D72829 Elevated white blood cell count, unspecified: Secondary | ICD-10-CM | POA: Diagnosis present

## 2023-02-27 DIAGNOSIS — Z66 Do not resuscitate: Secondary | ICD-10-CM | POA: Diagnosis not present

## 2023-02-27 DIAGNOSIS — G4731 Primary central sleep apnea: Secondary | ICD-10-CM | POA: Diagnosis present

## 2023-02-27 DIAGNOSIS — Z79899 Other long term (current) drug therapy: Secondary | ICD-10-CM

## 2023-02-27 DIAGNOSIS — Z8249 Family history of ischemic heart disease and other diseases of the circulatory system: Secondary | ICD-10-CM

## 2023-02-27 DIAGNOSIS — E162 Hypoglycemia, unspecified: Secondary | ICD-10-CM | POA: Diagnosis not present

## 2023-02-27 DIAGNOSIS — Z01818 Encounter for other preprocedural examination: Secondary | ICD-10-CM | POA: Diagnosis not present

## 2023-02-27 DIAGNOSIS — Z880 Allergy status to penicillin: Secondary | ICD-10-CM

## 2023-02-27 LAB — I-STAT BETA HCG BLOOD, ED (MC, WL, AP ONLY): I-stat hCG, quantitative: <5 m[IU]/mL (ref <5)

## 2023-02-27 LAB — COMPREHENSIVE METABOLIC PANEL
ALT: 134 U/L — ABNORMAL HIGH (ref 0–44)
AST: 129 U/L — ABNORMAL HIGH (ref 15–41)
Albumin: 3.1 g/dL — ABNORMAL LOW (ref 3.5–5.0)
Alkaline Phosphatase: 81 U/L (ref 38–126)
Anion gap: 13 (ref 5–15)
BUN: 13 mg/dL (ref 6–20)
CO2: 23 mmol/L (ref 22–32)
Calcium: 7.8 mg/dL — ABNORMAL LOW (ref 8.9–10.3)
Chloride: 101 mmol/L (ref 98–111)
Creatinine, Ser: 0.98 mg/dL (ref 0.44–1.00)
GFR, Estimated: >60 mL/min (ref >60)
Glucose, Bld: 185 mg/dL — ABNORMAL HIGH (ref 70–99)
Potassium: 5.1 mmol/L (ref 3.5–5.1)
Sodium: 137 mmol/L (ref 135–145)
Total Bilirubin: 1.3 mg/dL — ABNORMAL HIGH (ref 0.3–1.2)
Total Protein: 6 g/dL — ABNORMAL LOW (ref 6.5–8.1)

## 2023-02-27 LAB — BRAIN NATRIURETIC PEPTIDE
B Natriuretic Peptide: 55 pg/mL (ref 0.0–100.0)
B Natriuretic Peptide: 102.3 pg/mL — ABNORMAL HIGH (ref 0.0–100.0)

## 2023-02-27 LAB — I-STAT ARTERIAL BLOOD GAS, ED
Acid-Base Excess: 2 mmol/L (ref 0.0–2.0)
Bicarbonate: 31.2 mmol/L — ABNORMAL HIGH (ref 20.0–28.0)
Calcium, Ion: 1.13 mmol/L — ABNORMAL LOW (ref 1.15–1.40)
HCT: 43 % (ref 36.0–46.0)
Hemoglobin: 14.6 g/dL (ref 12.0–15.0)
O2 Saturation: 100 %
Patient temperature: 98.1
Potassium: 5.3 mmol/L — ABNORMAL HIGH (ref 3.5–5.1)
Sodium: 137 mmol/L (ref 135–145)
TCO2: 33 mmol/L — ABNORMAL HIGH (ref 22–32)
pCO2 arterial: 69.9 mmHg (ref 32–48)
pH, Arterial: 7.256 — ABNORMAL LOW (ref 7.35–7.45)
pO2, Arterial: 534 mmHg — ABNORMAL HIGH (ref 83–108)

## 2023-02-27 LAB — CBC
HCT: 46.2 % — ABNORMAL HIGH (ref 36.0–46.0)
Hemoglobin: 14.6 g/dL (ref 12.0–15.0)
MCH: 30.4 pg (ref 26.0–34.0)
MCHC: 31.6 g/dL (ref 30.0–36.0)
MCV: 96 fL (ref 80.0–100.0)
Platelets: 245 10*3/uL (ref 150–400)
RBC: 4.81 MIL/uL (ref 3.87–5.11)
RDW: 13.2 % (ref 11.5–15.5)
WBC: 16.8 10*3/uL — ABNORMAL HIGH (ref 4.0–10.5)
nRBC: 0 % (ref 0.0–0.2)

## 2023-02-27 LAB — CBC WITH DIFFERENTIAL/PLATELET
Abs Immature Granulocytes: 1.03 10*3/uL — ABNORMAL HIGH (ref 0.00–0.07)
Basophils Absolute: 0 10*3/uL (ref 0.0–0.1)
Basophils Relative: 0 %
Eosinophils Absolute: 0.1 10*3/uL (ref 0.0–0.5)
Eosinophils Relative: 1 %
HCT: 45.6 % (ref 36.0–46.0)
Hemoglobin: 14 g/dL (ref 12.0–15.0)
Immature Granulocytes: 8 %
Lymphocytes Relative: 21 %
Lymphs Abs: 2.9 10*3/uL (ref 0.7–4.0)
MCH: 30.5 pg (ref 26.0–34.0)
MCHC: 30.7 g/dL (ref 30.0–36.0)
MCV: 99.3 fL (ref 80.0–100.0)
Monocytes Absolute: 0.9 10*3/uL (ref 0.1–1.0)
Monocytes Relative: 6 %
Neutro Abs: 8.7 10*3/uL — ABNORMAL HIGH (ref 1.7–7.7)
Neutrophils Relative %: 64 %
Platelets: 277 10*3/uL (ref 150–400)
RBC: 4.59 MIL/uL (ref 3.87–5.11)
RDW: 13.2 % (ref 11.5–15.5)
WBC: 13.6 10*3/uL — ABNORMAL HIGH (ref 4.0–10.5)
nRBC: 0 % (ref 0.0–0.2)

## 2023-02-27 LAB — I-STAT VENOUS BLOOD GAS, ED
Acid-base deficit: 2 mmol/L (ref 0.0–2.0)
Bicarbonate: 29.1 mmol/L — ABNORMAL HIGH (ref 20.0–28.0)
Calcium, Ion: 1.08 mmol/L — ABNORMAL LOW (ref 1.15–1.40)
HCT: 45 % (ref 36.0–46.0)
Hemoglobin: 15.3 g/dL — ABNORMAL HIGH (ref 12.0–15.0)
O2 Saturation: 97 %
Potassium: 4.7 mmol/L (ref 3.5–5.1)
Sodium: 138 mmol/L (ref 135–145)
TCO2: 31 mmol/L (ref 22–32)
pCO2, Ven: 77.2 mmHg (ref 44–60)
pH, Ven: 7.184 — CL (ref 7.25–7.43)
pO2, Ven: 113 mmHg — ABNORMAL HIGH (ref 32–45)

## 2023-02-27 LAB — RESP PANEL BY RT-PCR (RSV, FLU A&B, COVID)  RVPGX2
Influenza A by PCR: NEGATIVE
Influenza B by PCR: NEGATIVE
Resp Syncytial Virus by PCR: NEGATIVE
SARS Coronavirus 2 by RT PCR: NEGATIVE

## 2023-02-27 LAB — POCT I-STAT 7, (LYTES, BLD GAS, ICA,H+H)
Acid-Base Excess: 0 mmol/L (ref 0.0–2.0)
Bicarbonate: 28.8 mmol/L — ABNORMAL HIGH (ref 20.0–28.0)
Calcium, Ion: 1.13 mmol/L — ABNORMAL LOW (ref 1.15–1.40)
HCT: 46 % (ref 36.0–46.0)
Hemoglobin: 15.6 g/dL — ABNORMAL HIGH (ref 12.0–15.0)
O2 Saturation: 96 %
Patient temperature: 35.9
Potassium: 4.5 mmol/L (ref 3.5–5.1)
Sodium: 138 mmol/L (ref 135–145)
TCO2: 31 mmol/L (ref 22–32)
pCO2 arterial: 59.2 mmHg — ABNORMAL HIGH (ref 32–48)
pH, Arterial: 7.289 — ABNORMAL LOW (ref 7.35–7.45)
pO2, Arterial: 91 mmHg (ref 83–108)

## 2023-02-27 LAB — TROPONIN I (HIGH SENSITIVITY)
Troponin I (High Sensitivity): 8 ng/L (ref <18)
Troponin I (High Sensitivity): 58 ng/L — ABNORMAL HIGH (ref <18)
Troponin I (High Sensitivity): 77 ng/L — ABNORMAL HIGH (ref <18)
Troponin I (High Sensitivity): 100 ng/L (ref <18)

## 2023-02-27 LAB — MRSA NEXT GEN BY PCR, NASAL: MRSA by PCR Next Gen: NOT DETECTED

## 2023-02-27 LAB — CREATININE, SERUM
Creatinine, Ser: 0.84 mg/dL (ref 0.44–1.00)
GFR, Estimated: >60 mL/min (ref >60)

## 2023-02-27 LAB — GLUCOSE, CAPILLARY
Glucose-Capillary: 104 mg/dL — ABNORMAL HIGH (ref 70–99)
Glucose-Capillary: 125 mg/dL — ABNORMAL HIGH (ref 70–99)

## 2023-02-27 LAB — LACTIC ACID, PLASMA
Lactic Acid, Venous: 2.6 mmol/L (ref 0.5–1.9)
Lactic Acid, Venous: 1.3 mmol/L (ref 0.5–1.9)

## 2023-02-27 LAB — PROCALCITONIN: Procalcitonin: <0.1 ng/mL

## 2023-02-27 LAB — STREP PNEUMONIAE URINARY ANTIGEN: Strep Pneumo Urinary Antigen: NEGATIVE

## 2023-02-27 LAB — CBG MONITORING, ED: Glucose-Capillary: 164 mg/dL — ABNORMAL HIGH (ref 70–99)

## 2023-02-27 MED ORDER — SODIUM CHLORIDE 0.9 % IV SOLN: INTRAVENOUS | Status: DC | PRN

## 2023-02-27 MED ORDER — FENTANYL 2500MCG IN NS 250ML (10MCG/ML) PREMIX INFUSION
50.0000 ug/h | INTRAVENOUS | Status: DC
  Administered 2023-02-27: 50 ug/h via INTRAVENOUS
  Administered 2023-02-28 – 2023-03-02 (×3): 150 ug/h via INTRAVENOUS
  Filled 2023-02-27 (×4): qty 250

## 2023-02-27 MED ORDER — FUROSEMIDE 10 MG/ML IJ SOLN: 80.0000 mg | Freq: Once | INTRAMUSCULAR | Status: DC

## 2023-02-27 MED ORDER — METHYLPREDNISOLONE SODIUM SUCC 40 MG IJ SOLR
40.0000 mg | Freq: Two times a day (BID) | INTRAMUSCULAR | Status: DC
  Administered 2023-02-27 – 2023-03-02 (×6): 40 mg via INTRAVENOUS
  Filled 2023-02-27 (×6): qty 1

## 2023-02-27 MED ORDER — SODIUM CHLORIDE 0.9 % IV SOLN
2.0000 g | INTRAVENOUS | Status: AC
  Administered 2023-02-27 – 2023-03-03 (×5): 2 g via INTRAVENOUS
  Filled 2023-02-27 (×5): qty 20

## 2023-02-27 MED ORDER — POLYETHYLENE GLYCOL 3350 17 G PO PACK
17.0000 g | PACK | Freq: Every day | ORAL | Status: DC | PRN
  Administered 2023-03-11: 17 g
  Filled 2023-02-27: qty 1

## 2023-02-27 MED ORDER — BUDESONIDE 0.5 MG/2ML IN SUSP
0.5000 mg | Freq: Two times a day (BID) | RESPIRATORY_TRACT | Status: DC
  Administered 2023-02-27 – 2023-03-14 (×30): 0.5 mg via RESPIRATORY_TRACT
  Filled 2023-02-27 (×30): qty 2

## 2023-02-27 MED ORDER — FUROSEMIDE 10 MG/ML IJ SOLN
40.0000 mg | Freq: Two times a day (BID) | INTRAMUSCULAR | Status: DC
  Administered 2023-02-28 – 2023-03-01 (×3): 40 mg via INTRAVENOUS
  Filled 2023-02-27 (×3): qty 4

## 2023-02-27 MED ORDER — INSULIN ASPART 100 UNIT/ML IJ SOLN
0.0000 [IU] | INTRAMUSCULAR | Status: DC
  Administered 2023-02-28 (×2): 3 [IU] via SUBCUTANEOUS
  Administered 2023-02-28: 4 [IU] via SUBCUTANEOUS
  Administered 2023-02-28 – 2023-03-01 (×2): 3 [IU] via SUBCUTANEOUS
  Administered 2023-03-01: 4 [IU] via SUBCUTANEOUS
  Administered 2023-03-01 (×2): 3 [IU] via SUBCUTANEOUS
  Administered 2023-03-01: 4 [IU] via SUBCUTANEOUS
  Administered 2023-03-02 (×2): 3 [IU] via SUBCUTANEOUS
  Administered 2023-03-02: 4 [IU] via SUBCUTANEOUS
  Administered 2023-03-02: 3 [IU] via SUBCUTANEOUS

## 2023-02-27 MED ORDER — ALBUTEROL SULFATE (2.5 MG/3ML) 0.083% IN NEBU: 10.0000 mg | INHALATION_SOLUTION | Freq: Once | RESPIRATORY_TRACT | Status: DC

## 2023-02-27 MED ORDER — CALCIUM GLUCONATE-NACL 1-0.675 GM/50ML-% IV SOLN
1.0000 g | Freq: Once | INTRAVENOUS | Status: AC
  Administered 2023-02-27: 1000 mg via INTRAVENOUS
  Filled 2023-02-27: qty 50

## 2023-02-27 MED ORDER — SUCCINYLCHOLINE CHLORIDE 20 MG/ML IJ SOLN
INTRAMUSCULAR | Status: DC | PRN
  Administered 2023-02-27: 200 mg via INTRAVENOUS

## 2023-02-27 MED ORDER — IPRATROPIUM-ALBUTEROL 0.5-2.5 (3) MG/3ML IN SOLN
3.0000 mL | RESPIRATORY_TRACT | Status: DC
  Administered 2023-02-27 – 2023-03-01 (×9): 3 mL via RESPIRATORY_TRACT
  Filled 2023-02-27 (×8): qty 3

## 2023-02-27 MED ORDER — PROPOFOL 1000 MG/100ML IV EMUL
5.0000 ug/kg/min | INTRAVENOUS | Status: DC
  Administered 2023-02-27: 20 ug/kg/min via INTRAVENOUS
  Administered 2023-02-27: 15 ug/kg/min via INTRAVENOUS
  Administered 2023-02-28: 80 ug/kg/min via INTRAVENOUS
  Administered 2023-02-28: 40 ug/kg/min via INTRAVENOUS
  Administered 2023-02-28: 30 ug/kg/min via INTRAVENOUS
  Administered 2023-02-28: 40 ug/kg/min via INTRAVENOUS
  Filled 2023-02-27 (×6): qty 100

## 2023-02-27 MED ORDER — CHLORHEXIDINE GLUCONATE CLOTH 2 % EX PADS
6.0000 | MEDICATED_PAD | Freq: Every day | CUTANEOUS | Status: DC
  Administered 2023-02-27 – 2023-03-14 (×17): 6 via TOPICAL

## 2023-02-27 MED ORDER — HEPARIN SODIUM (PORCINE) 5000 UNIT/ML IJ SOLN
5000.0000 [IU] | Freq: Three times a day (TID) | INTRAMUSCULAR | Status: DC
  Administered 2023-02-27 – 2023-03-14 (×44): 5000 [IU] via SUBCUTANEOUS
  Filled 2023-02-27 (×40): qty 1

## 2023-02-27 MED ORDER — FAMOTIDINE 20 MG PO TABS: 20.0000 mg | ORAL_TABLET | Freq: Two times a day (BID) | ORAL | Status: DC

## 2023-02-27 MED ORDER — ORAL CARE MOUTH RINSE
15.0000 mL | OROMUCOSAL | Status: DC | PRN
  Administered 2023-03-11: 15 mL via OROMUCOSAL

## 2023-02-27 MED ORDER — FENTANYL CITRATE PF 50 MCG/ML IJ SOSY
50.0000 ug | PREFILLED_SYRINGE | Freq: Once | INTRAMUSCULAR | Status: AC
  Administered 2023-02-27: 50 ug via INTRAVENOUS
  Filled 2023-02-27: qty 1

## 2023-02-27 MED ORDER — ORAL CARE MOUTH RINSE
15.0000 mL | OROMUCOSAL | Status: DC
  Administered 2023-02-27 – 2023-03-14 (×173): 15 mL via OROMUCOSAL

## 2023-02-27 MED ORDER — MAGNESIUM SULFATE 2 GM/50ML IV SOLN
2.0000 g | Freq: Once | INTRAVENOUS | Status: AC
  Administered 2023-02-27: 2 g via INTRAVENOUS
  Filled 2023-02-27: qty 50

## 2023-02-27 MED ORDER — PROPOFOL 1000 MG/100ML IV EMUL
5.0000 ug/kg/min | INTRAVENOUS | Status: DC
  Administered 2023-02-27: 15 ug/kg/min via INTRAVENOUS

## 2023-02-27 MED ORDER — SODIUM CHLORIDE 0.9 % IV SOLN
500.0000 mg | INTRAVENOUS | Status: AC
  Administered 2023-02-27 – 2023-03-01 (×3): 500 mg via INTRAVENOUS
  Filled 2023-02-27 (×3): qty 5

## 2023-02-27 MED ORDER — METHYLPREDNISOLONE SODIUM SUCC 125 MG IJ SOLR
125.0000 mg | Freq: Once | INTRAMUSCULAR | Status: AC
  Administered 2023-02-27: 125 mg via INTRAVENOUS
  Filled 2023-02-27: qty 2

## 2023-02-27 MED ORDER — ALBUTEROL (5 MG/ML) CONTINUOUS INHALATION SOLN
10.0000 mg/h | INHALATION_SOLUTION | Freq: Once | RESPIRATORY_TRACT | Status: DC
  Filled 2023-02-27: qty 20

## 2023-02-27 MED ORDER — DOCUSATE SODIUM 50 MG/5ML PO LIQD
100.0000 mg | Freq: Two times a day (BID) | ORAL | Status: DC | PRN
  Administered 2023-03-05 – 2023-03-11 (×2): 100 mg
  Filled 2023-02-27 (×2): qty 10

## 2023-02-27 MED ORDER — PANTOPRAZOLE SODIUM 40 MG IV SOLR
40.0000 mg | Freq: Every day | INTRAVENOUS | Status: DC
  Administered 2023-02-27 – 2023-03-13 (×15): 40 mg via INTRAVENOUS
  Filled 2023-02-27 (×12): qty 10

## 2023-02-27 MED ORDER — FENTANYL BOLUS VIA INFUSION
50.0000 ug | INTRAVENOUS | Status: DC | PRN
  Administered 2023-02-27 (×2): 100 ug via INTRAVENOUS
  Administered 2023-02-28: 50 ug via INTRAVENOUS

## 2023-02-27 MED ORDER — ALBUTEROL SULFATE (2.5 MG/3ML) 0.083% IN NEBU
INHALATION_SOLUTION | RESPIRATORY_TRACT | Status: AC
  Administered 2023-02-27: 2.5 mg
  Filled 2023-02-27: qty 12

## 2023-02-27 NOTE — Progress Notes (Addendum)
eLink Physician-Brief Progress Note Patient Name: KELICIA PULS DOB: Apr 08, 1976 MRN: [id number]   Date of Service  02/27/2023  HPI/Events of Note  47 year old female with a history of morbid obesity, asthma/COPD, sleep apnea who was initially found down with apnea and pulselessness with out of hospital cardiac arrest.  She was intubated and referred to critical care for further management  Initially bradypneic, hypotensive, and hypothermic.  Her initial laboratory studies show primarily hypercapnic acidosis.  Metabolic panel with mild elevation in liver function tests, mild troponin elevation and mild leukocytosis.    eICU Interventions  Agree with scheduled SVNs and steroids  GI prophylaxis with pantoprazole  DVT prophylaxis with heparin  No immediate intervention is necessary.   1944 - trend trop  Intervention Category Evaluation Type: New Patient Evaluation  Kenric Ginger 02/27/2023, 7:13 PM

## 2023-02-27 NOTE — ED Provider Notes (Signed)
[place] EMERGENCY DEPARTMENT AT [facility] Provider Note   CSN: [id number] Arrival date & time: 03-29-2023  1603     History  No chief complaint on file.   JWANA HOFSTETTER is a 47 y.o. female.  HPI 47 year old female history of COPD/asthma, OSA, obesity, systolic and diastolic heart failure, hypertension, depression.  Reportedly was found in her car in respiratory distress with a nebulizer.  On EMS arrival she was unresponsive and they could not feel a pulse.  They did approximately 2 minutes of CPR.  They do not have access at the time did not receive any medications.  She was intubated.  After intubation she did require sedation with Versed.  On arrival here she is unresponsive.     Home Medications Prior to Admission medications   Medication Sig Start Date End Date Taking? Authorizing Provider  bisoprolol (ZEBETA) 5 MG tablet Take 2.5 mg by mouth daily.   Yes [provider]  budesonide-formoterol (SYMBICORT) 160-4.5 MCG/ACT inhaler Inhale 2 puffs into the lungs 2 (two) times daily.   Yes [provider]  fluticasone (FLONASE) 50 MCG/ACT nasal spray Place 2 sprays into both nostrils daily.   Yes [provider]  albuterol (VENTOLIN HFA) 108 (90 Base) MCG/ACT inhaler Inhale 2 puffs every 4-6 hours as needed 11/26/22   Jetty Duhamel D, MD  buPROPion (WELLBUTRIN XL) 300 MG 24 hr tablet TAKE 1 TABLET BY MOUTH EVERY DAY IN THE MORNING 11/02/22   Arnette Felts, FNP  cetirizine (ZYRTEC) 10 MG tablet Take 10 mg by mouth daily.    [provider]  diphenhydrAMINE (BENADRYL) 25 mg capsule Take 25 mg by mouth daily as needed for allergies.    [provider]  Dupilumab (DUPIXENT) 300 MG/2ML SOPN Inject 300 mg into the skin every 14 (fourteen) days. Loading dose completed in clinic on 12/20/22 12/20/22   Jetty Duhamel D, MD  EPINEPHrine 0.3 mg/0.3 mL IJ SOAJ injection Inject 0.3 mg into the muscle as needed for anaphylaxis. 11/26/22   Waymon Budge, MD  Fluticasone-Umeclidin-Vilant (TRELEGY ELLIPTA) 200-62.5-25 MCG/ACT AEPB Inhale 1 puff then rinse mouth, once daily 10/28/22   Jetty Duhamel D, MD  furosemide (LASIX) 20 MG tablet TAKE 2 TABLETS (40 MG TOTAL) BY MOUTH DAILY. 02/14/23   Sabharwal, Aditya, DO  guaiFENesin (MUCINEX) 600 MG 12 hr tablet Take 600 mg by mouth as needed for cough or to loosen phlegm. 10/30/22   [provider]  ipratropium-albuterol (DUONEB) 0.5-2.5 (3) MG/3ML SOLN Take 3 mLs by nebulization as needed (for wheezing and shortness of breath). 12/20/22   Jetty Duhamel D, MD  montelukast (SINGULAIR) 10 MG tablet Take 1 tablet (10 mg total) by mouth at bedtime. 12/20/22   Waymon Budge, MD  Multiple Vitamins-Minerals (EMERGEN-C IMMUNE PO) Take 1 packet by mouth daily as needed (cold).    [provider]  nicotine (NICODERM CQ - DOSED IN MG/24 HOURS) 14 mg/24hr patch Place 1 patch (14 mg total) onto the skin daily. 10/28/22 10/28/23  Jetty Duhamel D, MD  nicotine polacrilex (NICORETTE) 4 MG lozenge Take 4 mg by mouth as needed for smoking cessation.    [provider]      Allergies    Penicillins and Pineapple    Review of Systems   Review of Systems  Unable to perform ROS: Intubated    Physical Exam Updated Vital Signs BP 115/72   Pulse 69   Temp 97.7 F (36.5 C) (Bladder)   Resp  15   Ht 5\' 1"  (1.549 m)   Wt (!) 169.2 kg   SpO2 99%   BMI 70.48 kg/m  Physical Exam Vitals and nursing note reviewed.  Constitutional:      Appearance: She is well-developed. She is obese. She is ill-appearing.     Comments: Unresponsive  HENT:     Head: Normocephalic and atraumatic.     Nose: Nose normal.     Mouth/Throat:     Mouth: Mucous membranes are moist.     Pharynx: Oropharynx is clear.  Eyes:     Extraocular Movements: Extraocular movements intact.     Conjunctiva/sclera: Conjunctivae normal.     Pupils: Pupils are equal, round, and reactive to light.  Cardiovascular:      Rate and Rhythm: Regular rhythm. Tachycardia present.     Heart sounds: No murmur heard. Pulmonary:     Comments: Intubated, breath sounds present bilaterally Abdominal:     Palpations: Abdomen is soft.     Tenderness: There is no abdominal tenderness.  Musculoskeletal:        General: No swelling.     Cervical back: Neck supple.     Right lower leg: No edema.     Left lower leg: No edema.  Skin:    General: Skin is warm and dry.     Capillary Refill: Capillary refill takes less than 2 seconds.  Neurological:     Comments: Pupils reactive.  No response to painful stimuli in extremities.  No spontaneous breathing.     ED Results / Procedures / Treatments   Labs (all labs ordered are listed, but only abnormal results are displayed) Labs Reviewed  CBC WITH DIFFERENTIAL/PLATELET - Abnormal; Notable for the following components:      Result Value   WBC 13.6 (*)    Neutro Abs 8.7 (*)    Abs Immature Granulocytes 1.03 (*)    All other components within normal limits  COMPREHENSIVE METABOLIC PANEL - Abnormal; Notable for the following components:   Glucose, Bld 185 (*)    Calcium 7.8 (*)    Total Protein 6.0 (*)    Albumin 3.1 (*)    AST 129 (*)    ALT 134 (*)    Total Bilirubin 1.3 (*)    All other components within normal limits  LACTIC ACID, PLASMA - Abnormal; Notable for the following components:   Lactic Acid, Venous 2.6 (*)    All other components within normal limits  CBC - Abnormal; Notable for the following components:   WBC 16.8 (*)    HCT 46.2 (*)    All other components within normal limits  BRAIN NATRIURETIC PEPTIDE - Abnormal; Notable for the following components:   B Natriuretic Peptide 102.3 (*)    All other components within normal limits  GLUCOSE, CAPILLARY - Abnormal; Notable for the following components:   Glucose-Capillary 125 (*)    All other components within normal limits  GLUCOSE, CAPILLARY - Abnormal; Notable for the following components:    Glucose-Capillary 104 (*)    All other components within normal limits  CBG MONITORING, ED - Abnormal; Notable for the following components:   Glucose-Capillary 164 (*)    All other components within normal limits  I-STAT ARTERIAL BLOOD GAS, ED - Abnormal; Notable for the following components:   pH, Arterial 7.256 (*)    pCO2 arterial 69.9 (*)    pO2, Arterial 534 (*)    Bicarbonate 31.2 (*)    TCO2 33 (*)  Potassium 5.3 (*)    Calcium, Ion 1.13 (*)    All other components within normal limits  I-STAT VENOUS BLOOD GAS, ED - Abnormal; Notable for the following components:   pH, Ven 7.184 (*)    pCO2, Ven 77.2 (*)    pO2, Ven 113 (*)    Bicarbonate 29.1 (*)    Calcium, Ion 1.08 (*)    Hemoglobin 15.3 (*)    All other components within normal limits  POCT I-STAT 7, (LYTES, BLD GAS, ICA,H+H) - Abnormal; Notable for the following components:   pH, Arterial 7.289 (*)    pCO2 arterial 59.2 (*)    Bicarbonate 28.8 (*)    Calcium, Ion 1.13 (*)    Hemoglobin 15.6 (*)    All other components within normal limits  TROPONIN I (HIGH SENSITIVITY) - Abnormal; Notable for the following components:   Troponin I (High Sensitivity) 58 (*)    All other components within normal limits  TROPONIN I (HIGH SENSITIVITY) - Abnormal; Notable for the following components:   Troponin I (High Sensitivity) 77 (*)    All other components within normal limits  TROPONIN I (HIGH SENSITIVITY) - Abnormal; Notable for the following components:   Troponin I (High Sensitivity) 100 (*)    All other components within normal limits  RESP PANEL BY RT-PCR (RSV, FLU A&B, COVID)  RVPGX2  MRSA NEXT GEN BY PCR, NASAL  CULTURE, RESPIRATORY W GRAM STAIN  LACTIC ACID, PLASMA  BRAIN NATRIURETIC PEPTIDE  CREATININE, SERUM  PROCALCITONIN  STREP PNEUMONIAE URINARY ANTIGEN  TRIGLYCERIDES  LEGIONELLA PNEUMOPHILA SEROGP 1 UR AG  BLOOD GAS, ARTERIAL  CBC  BASIC METABOLIC PANEL  BLOOD GAS, ARTERIAL  MAGNESIUM  BRAIN  NATRIURETIC PEPTIDE  HEMOGLOBIN A1C  LEGIONELLA PNEUMOPHILA SEROGP 1 UR AG  I-STAT BETA HCG BLOOD, ED ([facility], [facility], [facility] ONLY)  TROPONIN I (HIGH SENSITIVITY)    EKG None  Radiology DG Chest Portable 1 View  Result Date: 2023/03/27 CLINICAL DATA:  Post intubation EXAM: PORTABLE CHEST 1 VIEW COMPARISON:  December 06, 2022 FINDINGS: The ETT terminates 1.4 cm above the carina. The NG tube terminates in the stomach. The cardiomediastinal silhouette is stable. No pneumothorax. No nodules, masses, focal infiltrates, or edema. IMPRESSION: 1. The ETT terminates 1.4 cm above the carina. Recommend withdrawing 1 or 2 cm. 2. The OG tube terminates in the stomach. 3. No other acute abnormalities. 4. No acute cardiopulmonary disease. Electronically Signed   By: Gerome Sam III M.D.   On: 03-27-2023 16:37    Procedures Procedures    Medications Ordered in ED Medications  propofol (DIPRIVAN) 1000 MG/100ML infusion (20 mcg/kg/min  150 kg (Order-Specific) Intravenous Infusion Verify 02/28/23 0000)  fentaNYL in NS (81mcg/ml) infusion-PREMIX (100 mcg/hr Intravenous Infusion Verify 02/28/23 0000)  fentaNYL (SUBLIMAZE) bolus via infusion 50-100 mcg (100 mcg Intravenous Bolus from Bag 2023/03/27 1845)  docusate (COLACE) 50 MG/5ML liquid 100 mg (has no administration in time range)  polyethylene glycol (MIRALAX / GLYCOLAX) packet 17 g (has no administration in time range)  heparin injection 5,000 Units (5,000 Units Subcutaneous Given 2023/03/27 2142)  pantoprazole (PROTONIX) injection 40 mg (40 mg Intravenous Given 03/27/23 2142)  azithromycin (ZITHROMAX) 500 mg in sodium chloride 0.9 % 250 mL IVPB (0 mg Intravenous Stopped 03-27-23 2115)  methylPREDNISolone sodium succinate (SOLU-MEDROL) 40 mg/mL injection 40 mg (40 mg Intravenous Given 2023/03/27 2001)  ipratropium-albuterol (DUONEB) 0.5-2.5 (3) MG/3ML nebulizer solution 3 mL (3 mLs Nebulization Given 03-27-2023 2334)  budesonide (PULMICORT) nebulizer solution 0.5  mg (0.5  mg Nebulization Given 03-08-2023 2050)  furosemide (LASIX) injection 40 mg (has no administration in time range)  insulin aspart (novoLOG) injection 0-20 Units ( Subcutaneous Not Given 03/08/23 2343)  cefTRIAXone (ROCEPHIN) 2 g in sodium chloride 0.9 % 100 mL IVPB (0 g Intravenous Stopped 03-08-23 2031)  Chlorhexidine Gluconate Cloth 2 % PADS 6 each (6 each Topical Given 03/08/23 1930)  Oral care mouth rinse (15 mLs Mouth Rinse Given 02/28/23 0000)  Oral care mouth rinse (has no administration in time range)  0.9 %  sodium chloride infusion ( Intravenous Infusion Verify 02/28/23 0000)  0.9 %  sodium chloride infusion ( Intravenous Infusion Verify 02/28/23 0000)  methylPREDNISolone sodium succinate (SOLU-MEDROL) 125 mg/2 mL injection 125 mg (125 mg Intravenous Given Mar 08, 2023 1627)  magnesium sulfate IVPB 2 g 50 mL (0 g Intravenous Stopped 2023-03-08 1726)  albuterol (PROVENTIL) (2.5 MG/3ML) 0.083% nebulizer solution (2.5 mg  Given 03/08/23 1655)  fentaNYL (SUBLIMAZE) injection 50 mcg (50 mcg Intravenous Given 03/08/23 1658)  calcium gluconate 1 g/ 50 mL sodium chloride IVPB (0 mg Intravenous Stopped Mar 08, 2023 2205)    ED Course/ Medical Decision Making/ A&P                             Medical Decision Making Amount and/or Complexity of Data Reviewed Labs: ordered. Radiology: ordered.  Risk Prescription drug management. Decision regarding hospitalization.   47 year old female with history of asthma/COPD, obesity presenting for cardiac arrest.  History from EMS suggestive of likely respiratory arrest.  She arrives intubated, however we are having difficulty ventilating her due to a kink in the ET tube.  ET tube was exchanged as above and patient was reintubated.  Ventilation improved with this.  She does have some wheezing and diminished breath sounds, suspect mixed asthma/COPD picture.  Patient given Solu-Medrol, albuterol, magnesium.  Chest x-ray reviewed, no signs of pneumonia, pulmonary edema, pneumothorax.   Initial blood gas notable for severe hypercapnic hypoxic respiratory failure.  Improved on repeat blood gas.  Troponin and BNP normal.  I suspect her arrest was due to hypoxic hypercapnic respite failure from COPD and asthma exacerbation.  Critical care was consulted, patient admitted for further management.        Final Clinical Impression(s) / ED Diagnoses Final diagnoses:  None    Rx / DC Orders ED Discharge Orders     None         Fulton Reek, MD 02/28/23 4132    Glyn Ade, MD 02/28/23 1521

## 2023-02-27 NOTE — H&P (Signed)
NAME:  April Ellis, MRN:  [id number], DOB:  November 16, 1975, LOS: 0 ADMISSION DATE:  02/27/2023, CONSULTATION DATE: 6/2 REFERRING MD: Doran Durand, CHIEF COMPLAINT: Cardiac arrest  History of Present Illness:  This is a 47 year old female patient With known history of super obesity, asthma/COPD overlap, complex apnea both central and obstructive followed by Dr. Maple Hudson.  She was actually seen in our office on 5/3 at which time she was counseled to stop smoking, and was given a prednisone taper for possible asthmatic exacerbation. Patient was brought in by EMS on 6/2 after being found unresponsive outside of [employer].  Per EMS report she was initially apneic and pulseless she underwent 5 to 7 minutes of ACLS prior to return of spontaneous circulation she was intubated on the scene, after the fact EMS was unsure about whether or not she was truly pulseless.  Apparently following intubation she was agitated and combative requiring Versed.  Initial arterial blood gas pH 7.26, pCO2 70 pO2 534 HCO3 31.  White blood cell count 13.6, lactate 2.6.  Respiratory virus panel negative.  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 rate 12, height 5\' 1"  (1.549 m), SpO2 100 %.    Vent Mode: PRVC FiO2 (%):  [30 %-100 %] 30 % Set Rate:  [25 bmp-30 bmp] 30 bmp Vt Set:  [380 mL] 380 mL PEEP:  [5 cmH20] 5 cmH20  No intake  or output data in the 24 hours ending 02/27/23 1748 There were no vitals filed for this visit.  Examination: General: Morbidly obese 47 year old female sedated on ventilator HENT: Normocephalic atraumatic no JVD orally intubated sluggish pupil response Pulmonary: Decreased throughout Portable chest x-ray personally reviewed endotracheal tube in satisfactory position cardiomegaly no clear infiltrates Cardiac: Regular rate and rhythm Abdomen: Soft large Extremities: Lower extremity pitting edema Neuro: Sedated GU: Clear yellow  Resolved Hospital Problem list     Assessment & Plan:  Acute on chronic hypoxic and hypercarbic respiratory failure secondary to mixed picture of acute exacerbation of chronic obstructive pulmonary disease and decompensated Diastolic heart failure, superimposed on morbid obesity and obstructive sleep apnea Plan Full ventilator support Follow-up arterial blood gas Scheduled bronchodilators Systemic steroids IV Lasix, aiming for aggressive diuresis Respiratory culture Will place on empiric CAP coverage PAD protocol RASS goal -2 Repeat echocardiogram  Cardiac arrest, with residual lactic acidosis.  Felt PEA on arrival although EMS not entirely clear if pulseless or not.  Return of spontaneous circulation estimated at 5 to 7 minutes.  Required sedation en route to the hospital for agitation and combativeness Plan CT head Repeat lactate Avoid fever Keep euvolemic Keep euglycemic  Acute diastolic heart failure Plan IV diuresis Hold her Entresto for now Repeat echocardiogram Telemetry monitoring  Acute metabolic encephalopathy is status post cardiac arrest.  Suspect additionally complicated by hypercarbia Plan Continue supportive care with mechanical ventilation CT head Supportive care Holding Wellbutrin  Fluid and electrolyte imbalance: Hyperkalemia, mild hypocalcemia Plan IV Lasix Follow-up chemistries  Hyperglycemia Plan Sliding scale  insulin  Best Practice (right click and "Reselect all SmartList Selections" daily)   Diet/type: NPO DVT prophylaxis: prophylactic heparin  GI prophylaxis: PPI Lines: N/A Foley:  Yes, and it is still needed Code Status:  full code Last date of multidisciplinary goals of care discussion [pending]  Labs   CBC: Recent Labs  Lab 02/27/23 1618 02/27/23 1628 02/27/23 1707  WBC 13.6*  --   --   NEUTROABS 8.7*  --   --   HGB 14.0 15.3* 14.6  HCT 45.6 45.0 43.0  MCV 99.3  --   --   PLT 277  --   --     Basic Metabolic Panel: Recent Labs  Lab 02/27/23 1618 02/27/23 1628 02/27/23 1707  NA 137 138 137  K 5.1 4.7 5.3*  CL 101  --   --   CO2 23  --   --   GLUCOSE 185*  --   --   BUN 13  --   --   CREATININE 0.98  --   --   CALCIUM 7.8*  --   --    GFR: CrCl cannot be calculated (Unknown ideal weight.). Recent Labs  Lab 02/27/23 1615 02/27/23 1618  WBC  --  13.6*  LATICACIDVEN 2.6*  --     Liver Function Tests: Recent Labs  Lab 02/27/23 1618  AST 129*  ALT 134*  ALKPHOS 81  BILITOT 1.3*  PROT 6.0*  ALBUMIN 3.1*   No results for input(s): "LIPASE", "AMYLASE" in the last 168 hours. No results for input(s): "AMMONIA" in the last 168 hours.  ABG    Component Value Date/Time   PHART 7.256 (L) 02/27/2023 1707   PCO2ART 69.9 (HH) 02/27/2023 1707   PO2ART 534 (H) 02/27/2023 1707   HCO3 31.2 (H) 02/27/2023 1707   TCO2 33 (H) 02/27/2023 1707   ACIDBASEDEF 2.0 02/27/2023 1628   O2SAT 100 02/27/2023 1707     Coagulation Profile: No results for input(s): "INR", "PROTIME" in the last 168 hours.  Cardiac Enzymes: No results for input(s): "CKTOTAL", "CKMB", "CKMBINDEX", "TROPONINI" in the last 168 hours.  HbA1C: Hgb A1c MFr Bld  Date/Time Value Ref Range Status  04/21/2022 04:17 PM 5.0 4.8 - 5.6 % Final    Comment:             Prediabetes: 5.7 - 6.4          Diabetes: >6.4          Glycemic control for adults with diabetes: <7.0   07/11/2019 10:11 AM  5.1 <5.7 % of total Hgb Final    Comment:    For the purpose of screening for the presence of diabetes: . <5.7%       Consistent with the absence of diabetes 5.7-6.4%    Consistent with increased risk for diabetes             (prediabetes) > or =6.5%  Consistent with diabetes . This assay result is consistent with a decreased risk of diabetes. . Currently, no consensus exists regarding use of hemoglobin A1c for diagnosis of diabetes in children. . According to American Diabetes Association (ADA) guidelines, hemoglobin A1c <7.0% represents optimal control in non-pregnant diabetic patients. Different metrics may apply to specific patient populations.  Standards of Medical Care in Diabetes(ADA). .     CBG: Recent Labs  Lab 02/27/23 1611  GLUCAP 164*    Review of Systems:   Not able   Past Medical History:  She,  has a past medical history  of Angio-edema, Asthma, Bronchitis, CHF (congestive heart failure) (HCC), COPD (chronic obstructive pulmonary disease) (HCC), Dyspnea, Obesity, Respiratory arrest (HCC) (04/04/2020), and Sleep apnea.   Surgical History:   Past Surgical History:  Procedure Laterality Date   DENTAL SURGERY     2 teeth pulled, 11/22/14   DILITATION & CURRETTAGE/HYSTROSCOPY WITH NOVASURE ABLATION N/A 03/05/2020   Procedure: DILATATION & CURETTAGE/HYSTEROSCOPY WITH NOVASURE ABLATION;  Surgeon: Richarda Overlie, MD;  Location: [facility];  Service: Gynecology;  Laterality: N/A;   NO PAST SURGERIES     RIGHT/LEFT HEART CATH AND CORONARY ANGIOGRAPHY N/A 06/11/2022   Procedure: RIGHT/LEFT HEART CATH AND CORONARY ANGIOGRAPHY;  Surgeon: Orbie Pyo, MD;  Location: [facility];  Service: Cardiovascular;  Laterality: N/A;     Social History:   reports that she has been smoking cigarettes. She has a 7.50 pack-year smoking history. She has been exposed to tobacco smoke. She has never used smokeless tobacco. She reports current alcohol use. She reports current  drug use. Drug: Marijuana.   Family History:  Her family history includes Cancer in her mother; Diabetes in her mother and sister; Hypertension in her sister and sister. There is no history of Urticaria, Immunodeficiency, Eczema, Asthma, Angioedema, or Allergic rhinitis.   Allergies Allergies  Allergen Reactions   Penicillins Other (See Comments)    Childhood Allergy Did it involve swelling of the face/tongue/throat, SOB, or low BP? UNK Did it involve sudden or severe rash/hives, skin peeling, or any reaction on the inside of your mouth or nose? UNK Did you need to seek medical attention at a hospital or doctor's office? UNK When did it last happen?      more than 10 years If all above answers are "NO", may proceed with cephalosporin use.    Pineapple Other (See Comments)    Tested allergic to this     Home Medications  Prior to Admission medications   Medication Sig Start Date End Date Taking? Authorizing Provider  albuterol (VENTOLIN HFA) 108 (90 Base) MCG/ACT inhaler Inhale 2 puffs every 4-6 hours as needed 11/26/22   Jetty Duhamel D, MD  buPROPion (WELLBUTRIN XL) 300 MG 24 hr tablet TAKE 1 TABLET BY MOUTH EVERY DAY IN THE MORNING 11/02/22   Arnette Felts, FNP  cetirizine (ZYRTEC) 10 MG tablet Take 10 mg by mouth daily.    [provider]  diphenhydrAMINE (BENADRYL) 25 mg capsule Take 25 mg by mouth daily as needed for allergies.    [provider]  Dupilumab (DUPIXENT) 300 MG/2ML SOPN Inject 300 mg into the skin every 14 (fourteen) days. Loading dose completed in clinic on 12/20/22 12/20/22   Jetty Duhamel D, MD  EPINEPHrine 0.3 mg/0.3 mL IJ SOAJ injection Inject 0.3 mg into the muscle as needed for anaphylaxis. 11/26/22   Waymon Budge, MD  Fluticasone-Umeclidin-Vilant (TRELEGY ELLIPTA) 200-62.5-25 MCG/ACT AEPB Inhale 1 puff then rinse mouth, once daily 10/28/22   Jetty Duhamel D, MD  furosemide (LASIX) 20 MG tablet TAKE 2 TABLETS (40 MG TOTAL) BY MOUTH DAILY. 02/14/23    Sabharwal, Aditya, DO  guaiFENesin (MUCINEX) 600 MG 12 hr tablet Take 600 mg by mouth as needed for cough or to loosen phlegm. 10/30/22   [provider]  ipratropium-albuterol (DUONEB) 0.5-2.5 (3) MG/3ML SOLN Take 3 mLs by nebulization as needed (for wheezing and shortness of breath). 12/20/22   Jetty Duhamel D, MD  montelukast (SINGULAIR) 10 MG tablet Take 1 tablet (10 mg total) by mouth at bedtime. 12/20/22  Waymon Budge, MD  Multiple Vitamins-Minerals (EMERGEN-C IMMUNE PO) Take 1 packet by mouth daily as needed (cold).    [provider]  nicotine (NICODERM CQ - DOSED IN MG/24 HOURS) 14 mg/24hr patch Place 1 patch (14 mg total) onto the skin daily. 10/28/22 10/28/23  Jetty Duhamel D, MD  nicotine polacrilex (NICORETTE) 4 MG lozenge Take 4 mg by mouth as needed for smoking cessation.    [provider]  predniSONE (DELTASONE) 10 MG tablet 4 X 2 DAYS, 3 X 2 DAYS, 2 X 2 DAYS, 1 X 2 DAYS Patient taking differently: Take 10 mg by mouth daily with breakfast. 4 X 2 DAYS, 3 X 2 DAYS, 2 X 2 DAYS, 1 X 2 DAYS 01/28/23   Young, Joni Fears D, MD  sacubitril-valsartan (ENTRESTO) 24-26 MG Take 1 tablet by mouth 2 (two) times daily. Patient not taking: Reported on 02/09/2023 06/12/22   Cathren Harsh, MD     Critical care time: 35 min    Simonne Martinet ACNP-BC [facility] Pager # [phone] OR # [phone] if no answer

## 2023-02-27 NOTE — ED Triage Notes (Addendum)
Patient arrived by [facility] after being found in car unresponsive. Ems reports apneic and pulseless. Immediately started cpr that was ongoing 5-7 minutes then return of pulse. Intubated with 7.0 ETT and arrived with 1 PIV in place CBG 219 Last BP 114/70 Initially PEA rate of 70 EMS is unsure if patient ever lost pulse or resp arrest due to large frame and difficulty palpating pulse.

## 2023-02-28 ENCOUNTER — Inpatient Hospital Stay (HOSPITAL_COMMUNITY): Payer: BC Managed Care – PPO

## 2023-02-28 DIAGNOSIS — I469 Cardiac arrest, cause unspecified: Secondary | ICD-10-CM | POA: Diagnosis not present

## 2023-02-28 DIAGNOSIS — G40409 Other generalized epilepsy and epileptic syndromes, not intractable, without status epilepticus: Secondary | ICD-10-CM

## 2023-02-28 DIAGNOSIS — R569 Unspecified convulsions: Secondary | ICD-10-CM | POA: Diagnosis not present

## 2023-02-28 LAB — POCT I-STAT 7, (LYTES, BLD GAS, ICA,H+H)
Acid-Base Excess: 1 mmol/L (ref 0.0–2.0)
Bicarbonate: 28.4 mmol/L — ABNORMAL HIGH (ref 20.0–28.0)
Calcium, Ion: 1.08 mmol/L — ABNORMAL LOW (ref 1.15–1.40)
HCT: 44 % (ref 36.0–46.0)
Hemoglobin: 15 g/dL (ref 12.0–15.0)
O2 Saturation: 95 %
Patient temperature: 36.7
Potassium: 5.1 mmol/L (ref 3.5–5.1)
Sodium: 136 mmol/L (ref 135–145)
TCO2: 30 mmol/L (ref 22–32)
pCO2 arterial: 56 mmHg — ABNORMAL HIGH (ref 32–48)
pH, Arterial: 7.312 — ABNORMAL LOW (ref 7.35–7.45)
pO2, Arterial: 85 mmHg (ref 83–108)

## 2023-02-28 LAB — GLUCOSE, CAPILLARY
Glucose-Capillary: 137 mg/dL — ABNORMAL HIGH (ref 70–99)
Glucose-Capillary: 110 mg/dL — ABNORMAL HIGH (ref 70–99)
Glucose-Capillary: 120 mg/dL — ABNORMAL HIGH (ref 70–99)
Glucose-Capillary: 136 mg/dL — ABNORMAL HIGH (ref 70–99)
Glucose-Capillary: 147 mg/dL — ABNORMAL HIGH (ref 70–99)
Glucose-Capillary: 155 mg/dL — ABNORMAL HIGH (ref 70–99)

## 2023-02-28 LAB — TROPONIN I (HIGH SENSITIVITY): Troponin I (High Sensitivity): 123 ng/L (ref <18)

## 2023-02-28 LAB — BASIC METABOLIC PANEL
Anion gap: 8 (ref 5–15)
BUN: 10 mg/dL (ref 6–20)
CO2: 26 mmol/L (ref 22–32)
Calcium: 7.9 mg/dL — ABNORMAL LOW (ref 8.9–10.3)
Chloride: 101 mmol/L (ref 98–111)
Creatinine, Ser: 0.67 mg/dL (ref 0.44–1.00)
GFR, Estimated: >60 mL/min (ref >60)
Glucose, Bld: 130 mg/dL — ABNORMAL HIGH (ref 70–99)
Potassium: 4.7 mmol/L (ref 3.5–5.1)
Sodium: 135 mmol/L (ref 135–145)

## 2023-02-28 LAB — ECHOCARDIOGRAM COMPLETE
AR max vel: 3.08 cm2
AV Area VTI: 3.07 cm2
AV Area mean vel: 2.99 cm2
AV Mean grad: 5 mmHg
AV Peak grad: 9.5 mmHg
Ao pk vel: 1.54 m/s
Area-P 1/2: 3.22 cm2
Height: 61 in
S' Lateral: 4.1 cm
Weight: 5968.29 oz

## 2023-02-28 LAB — CBC
HCT: 44.8 % (ref 36.0–46.0)
Hemoglobin: 13.7 g/dL (ref 12.0–15.0)
MCH: 29.3 pg (ref 26.0–34.0)
MCHC: 30.6 g/dL (ref 30.0–36.0)
MCV: 95.9 fL (ref 80.0–100.0)
Platelets: 238 10*3/uL (ref 150–400)
RBC: 4.67 MIL/uL (ref 3.87–5.11)
RDW: 13.1 % (ref 11.5–15.5)
WBC: 12.5 10*3/uL — ABNORMAL HIGH (ref 4.0–10.5)
nRBC: 0 % (ref 0.0–0.2)

## 2023-02-28 LAB — BRAIN NATRIURETIC PEPTIDE: B Natriuretic Peptide: 328.4 pg/mL — ABNORMAL HIGH (ref 0.0–100.0)

## 2023-02-28 LAB — MAGNESIUM: Magnesium: 2.4 mg/dL (ref 1.7–2.4)

## 2023-02-28 LAB — HEMOGLOBIN A1C
Hgb A1c MFr Bld: 4.8 % (ref 4.8–5.6)
Mean Plasma Glucose: 91 mg/dL

## 2023-02-28 LAB — TRIGLYCERIDES: Triglycerides: 59 mg/dL (ref <150)

## 2023-02-28 MED ORDER — PROPOFOL 1000 MG/100ML IV EMUL
0.0000 ug/kg/min | INTRAVENOUS | Status: DC
  Administered 2023-02-28 – 2023-03-01 (×8): 80 ug/kg/min via INTRAVENOUS
  Administered 2023-03-01: 50 ug/kg/min via INTRAVENOUS
  Administered 2023-03-01 (×2): 80 ug/kg/min via INTRAVENOUS
  Administered 2023-03-01: 70 ug/kg/min via INTRAVENOUS
  Administered 2023-03-01: 80 ug/kg/min via INTRAVENOUS
  Administered 2023-03-01: 10 ug/kg/min via INTRAVENOUS
  Administered 2023-03-01: 60 ug/kg/min via INTRAVENOUS
  Administered 2023-03-01 (×4): 80 ug/kg/min via INTRAVENOUS
  Administered 2023-03-01: 20 ug/kg/min via INTRAVENOUS
  Administered 2023-03-01: 80 ug/kg/min via INTRAVENOUS
  Administered 2023-03-02: 10 ug/kg/min via INTRAVENOUS
  Filled 2023-02-28: qty 200
  Filled 2023-02-28 (×2): qty 100
  Filled 2023-02-28: qty 200
  Filled 2023-02-28: qty 300
  Filled 2023-02-28: qty 200
  Filled 2023-02-28 (×2): qty 100
  Filled 2023-02-28: qty 200
  Filled 2023-02-28: qty 400
  Filled 2023-02-28: qty 200
  Filled 2023-02-28: qty 300

## 2023-02-28 MED ORDER — MIDAZOLAM-SODIUM CHLORIDE 100-0.9 MG/100ML-% IV SOLN
10.0000 mg/h | INTRAVENOUS | Status: DC
  Administered 2023-02-28 – 2023-03-02 (×5): 10 mg/h via INTRAVENOUS
  Filled 2023-02-28: qty 100
  Filled 2023-02-28: qty 300
  Filled 2023-02-28 (×4): qty 100

## 2023-02-28 MED ORDER — LEVETIRACETAM IN NACL 1500 MG/100ML IV SOLN
1500.0000 mg | Freq: Two times a day (BID) | INTRAVENOUS | Status: DC
  Administered 2023-02-28 – 2023-03-14 (×28): 1500 mg via INTRAVENOUS
  Filled 2023-02-28 (×29): qty 100

## 2023-02-28 MED ORDER — LABETALOL HCL 5 MG/ML IV SOLN
10.0000 mg | INTRAVENOUS | Status: DC | PRN
  Administered 2023-02-28: 10 mg via INTRAVENOUS
  Filled 2023-02-28: qty 4

## 2023-02-28 MED ORDER — PROPOFOL BOLUS VIA INFUSION
40.0000 mg | Freq: Once | INTRAVENOUS | Status: AC
  Administered 2023-02-28: 40 mg via INTRAVENOUS
  Filled 2023-02-28: qty 40

## 2023-02-28 MED ORDER — VITAL AF 1.2 CAL PO LIQD
1000.0000 mL | ORAL | Status: DC
  Administered 2023-02-28 – 2023-03-13 (×12): 1000 mL
  Filled 2023-02-28 (×7): qty 1000

## 2023-02-28 MED ORDER — MIDAZOLAM HCL 2 MG/2ML IJ SOLN
4.0000 mg | Freq: Once | INTRAMUSCULAR | Status: AC
  Administered 2023-02-28: 4 mg via INTRAVENOUS
  Filled 2023-02-28: qty 4

## 2023-02-28 MED ORDER — SODIUM CHLORIDE 0.9 % IV SOLN
250.0000 mL | INTRAVENOUS | Status: DC
  Administered 2023-03-07 – 2023-03-09 (×2): 250 mL via INTRAVENOUS

## 2023-02-28 MED ORDER — MIDAZOLAM BOLUS VIA INFUSION
4.0000 mg | Freq: Once | INTRAVENOUS | Status: AC
  Administered 2023-02-28: 4 mg via INTRAVENOUS
  Filled 2023-02-28: qty 4

## 2023-02-28 MED ORDER — SODIUM CHLORIDE 0.9 % IV SOLN
6000.0000 mg | Freq: Once | INTRAVENOUS | Status: AC
  Administered 2023-02-28: 6000 mg via INTRAVENOUS
  Filled 2023-02-28: qty 60

## 2023-02-28 MED ORDER — CALCIUM GLUCONATE-NACL 1-0.675 GM/50ML-% IV SOLN
1.0000 g | Freq: Once | INTRAVENOUS | Status: AC
  Administered 2023-02-28: 1000 mg via INTRAVENOUS
  Filled 2023-02-28: qty 50

## 2023-02-28 MED ORDER — PROSOURCE TF20 ENFIT COMPATIBL EN LIQD
60.0000 mL | Freq: Every day | ENTERAL | Status: DC
  Administered 2023-02-28: 60 mL
  Filled 2023-02-28: qty 60

## 2023-02-28 MED ORDER — VITAL HIGH PROTEIN PO LIQD: 1000.0000 mL | ORAL | Status: DC

## 2023-02-28 MED ORDER — NOREPINEPHRINE 4 MG/250ML-% IV SOLN
2.0000 ug/min | INTRAVENOUS | Status: DC
  Administered 2023-02-28: 2 ug/min via INTRAVENOUS
  Administered 2023-03-02: 3 ug/min via INTRAVENOUS
  Filled 2023-02-28 (×2): qty 250

## 2023-02-28 MED ORDER — SODIUM CHLORIDE 0.9 % IV SOLN: 2000.0000 mg | Freq: Once | INTRAVENOUS | Status: DC

## 2023-02-28 MED ORDER — PROSOURCE TF20 ENFIT COMPATIBL EN LIQD
60.0000 mL | Freq: Two times a day (BID) | ENTERAL | Status: DC
  Administered 2023-02-28 – 2023-03-14 (×27): 60 mL
  Filled 2023-02-28 (×26): qty 60

## 2023-02-28 NOTE — Progress Notes (Signed)
NAME:  April Ellis, MRN:  [id number], DOB:  22-Mar-1976, LOS: 1 ADMISSION DATE:  02/27/2023, CONSULTATION DATE: 6/2 REFERRING MD: Doran Durand, CHIEF COMPLAINT: Cardiac arrest  History of Present Illness:  This is a 47 year old female patient With known history of super obesity, asthma/COPD overlap, complex apnea both central and obstructive followed by Dr. Maple Hudson.  She was actually seen in our office on 5/3 at which time she was counseled to stop smoking, and was given a prednisone taper for possible asthmatic exacerbation. Patient was brought in by EMS on 6/2 after being found unresponsive outside of [employer].  Per EMS report she was initially apneic and pulseless she underwent 5 to 7 minutes of ACLS prior to return of spontaneous circulation she was intubated on the scene, after the fact EMS was unsure about whether or not she was truly pulseless.  Apparently following intubation she was agitated and combative requiring Versed.  Initial arterial blood gas pH 7.26, pCO2 70 pO2 534 HCO3 31.  White blood cell count 13.6, lactate 2.6.  Respiratory virus panel negative.  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 rate (!) 28, height 5\' 1"  (1.549 m), weight (!) 169.2 kg, SpO2 98 %.    Vent Mode: PRVC FiO2  (%):  [20 %-100 %] 30 % Set Rate:  [25 bmp-30 bmp] 28 bmp Vt Set:  [380 mL] 380 mL PEEP:  [5 cmH20-10 cmH20] 10 cmH20 Plateau Pressure:  [27 cmH20] 27 cmH20   Intake/Output Summary (Last 24 hours) at 02/28/2023 0740 Last data filed at 02/28/2023 0700 Gross per 24 hour  Intake 1139.43 ml  Output 540 ml  Net 599.43 ml   Filed Weights   02/27/23 2015 02/28/23 0600  Weight: (!) 169.2 kg (!) 169.2 kg   General:  morbidly obese F, intubated and sedated HEENT: MM pink/moist, ETT in place sclera anicteric Neuro: examined initially on fentanyl and propofol with RASS -5, sedation weaned off and had upper body myoclonic jerking without responsiveness, triggering vent and + gag CV: s1s2 rrr, no m/r/g PULM:  mechanically ventilated without rhonchi or wheezing, synchronous, PEEP 10 O2 30% GI: soft, bsx4 active, obese  Extremities: warm/dry, no edema  Skin: no rashes or lesions  Labs: Trop 77>100>123 7.31/56/85/28 BNP 328 K 4.7 Mag 2.4  Resolved Hospital Problem list     Assessment & Plan:    Acute on chronic hypoxic and hypercarbic respiratory failure secondary to mixed picture of acute exacerbation of chronic obstructive pulmonary disease and decompensated Diastolic heart failure, superimposed on morbid obesity and obstructive sleep apnea Pt's sister reports multiple episodes of severe shortness of breath requiring EMS, she continues to smoke -covid, flu negative and no infiltrate on CXR -continue CAP coverage today, can likely stop tomorrow if continues to be afebrile -doubt PE as is hemodynamically stable and no significant BNP or troponin rise  -continue scheduled bronchodilators and steroids  -continue diuresis, echo  is pending  -hold Entresto for now -normothermia, euglycemia -propofol and fentanyl for PAD protocol --Maintain full vent support with SAT/SBT as tolerated -titrate Vent setting to maintain SpO2 greater than or equal to 90%. -HOB elevated 30 degrees. -Plateau  pressures less than 30 cm H20.  -Follow chest x-ray, ABG prn.   -Bronchial hygiene and RT/bronchodilator protocol.   Unwitnessed out of hospital likely PEA Cardiac arrest Acute Encephalopathy Felt PEA on arrival although EMS not entirely clear if pulseless or not.  Return of spontaneous circulation estimated at 5 to 7 minutes.  Required sedation en route to the hospital for agitation and combativeness -concerned that pt may have sustained a significant hypoxic injury as is having myoclonic jerking with sedation wean -CTH with possible possible hypodensity in the L cerebellum  -EEG -continue propofol -MRI at 72hrs -continue holding Wellbutrin  Hyperkalemia, mild hypocalcemia -trend and replete prn  Hyperglycemia -Sliding scale insulin   GOC:  Pt's son, who is 5, is nearest relative.  Pt's sister states that he will likely not be able to handle making difficult decisions for patient and will likely defer to her. [name].  We discussed the concern that patient has likely sustained a hypoxic ischemic injury.  [name] stated pt would not have wanted to live long-term on life support.  We discussed both DNR and transitioning to comfort care in the coming days if patient is not showing signs of neurologic recovery.  She will discuss with family.    Best Practice (right click and "Reselect all SmartList Selections" daily)   Diet/type: tubefeeds DVT prophylaxis: prophylactic heparin  GI prophylaxis: PPI Lines: N/A Foley:  Yes, and it is still needed Code Status:  full code Last date of multidisciplinary goals of care discussion [6/3 pt's sister updated at the bedside ]  Labs   CBC: Recent Labs  Lab 02/27/23 1618 02/27/23 1628 02/27/23 1707 02/27/23 2018 02/27/23 2020 02/28/23 0415  WBC 13.6*  --   --  16.8*  --  12.5*  NEUTROABS 8.7*  --   --   --   --   --   HGB 14.0 15.3* 14.6 14.6 15.6* 13.7  15.0  HCT 45.6 45.0 43.0 46.2* 46.0 44.8  44.0  MCV 99.3  --   --  96.0  --  95.9   PLT 277  --   --  245  --  238     Basic Metabolic Panel: Recent Labs  Lab 02/27/23 1618 02/27/23 1628 02/27/23 1707 02/27/23 2018 02/27/23 2020 02/28/23 0415  NA 137 138 137  --  138 135  136  K 5.1 4.7 5.3*  --  4.5 4.7  5.1  CL 101  --   --   --   --  101  CO2 23  --   --   --   --  26  GLUCOSE 185*  --   --   --   --  130*  BUN 13  --   --   --   --  10  CREATININE 0.98  --   --  0.84  --  0.67  CALCIUM 7.8*  --   --   --   --  7.9*  MG  --   --   --   --   --  2.4    GFR: Estimated Creatinine Clearance: 135.1 mL/min (by C-G formula based on SCr of 0.67 mg/dL). Recent Labs  Lab 02/27/23 1615 02/27/23 1618 02/27/23 1800 02/27/23 2018 02/28/23  0415  PROCALCITON  --   --   --  <0.10  --   WBC  --  13.6*  --  16.8* 12.5*  LATICACIDVEN 2.6*  --  1.3  --   --      Liver Function Tests: Recent Labs  Lab 02/27/23 1618  AST 129*  ALT 134*  ALKPHOS 81  BILITOT 1.3*  PROT 6.0*  ALBUMIN 3.1*    No results for input(s): "LIPASE", "AMYLASE" in the last 168 hours. No results for input(s): "AMMONIA" in the last 168 hours.  ABG    Component Value Date/Time   PHART 7.312 (L) 02/28/2023 0415   PCO2ART 56.0 (H) 02/28/2023 0415   PO2ART 85 02/28/2023 0415   HCO3 28.4 (H) 02/28/2023 0415   TCO2 30 02/28/2023 0415   ACIDBASEDEF 2.0 02/27/2023 1628   O2SAT 95 02/28/2023 0415     Coagulation Profile: No results for input(s): "INR", "PROTIME" in the last 168 hours.  Cardiac Enzymes: No results for input(s): "CKTOTAL", "CKMB", "CKMBINDEX", "TROPONINI" in the last 168 hours.  HbA1C: Hgb A1c MFr Bld  Date/Time Value Ref Range Status  04/21/2022 04:17 PM 5.0 4.8 - 5.6 % Final    Comment:             Prediabetes: 5.7 - 6.4          Diabetes: >6.4          Glycemic control for adults with diabetes: <7.0   07/11/2019 10:11 AM 5.1 <5.7 % of total Hgb Final    Comment:    For the purpose of screening for the presence of diabetes: . <5.7%       Consistent  with the absence of diabetes 5.7-6.4%    Consistent with increased risk for diabetes             (prediabetes) > or =6.5%  Consistent with diabetes . This assay result is consistent with a decreased risk of diabetes. . Currently, no consensus exists regarding use of hemoglobin A1c for diagnosis of diabetes in children. . According to American Diabetes Association (ADA) guidelines, hemoglobin A1c <7.0% represents optimal control in non-pregnant diabetic patients. Different metrics may apply to specific patient populations.  Standards of Medical Care in Diabetes(ADA). .     CBG: Recent Labs  Lab 02/27/23 1611 02/27/23 1935 02/27/23 2342 02/28/23 0414  GLUCAP 164* 125* 104* 137*     Review of Systems:   Not able   Past Medical History:  She,  has a past medical history of Angio-edema, Asthma, Bronchitis, CHF (congestive heart failure) (HCC), COPD (chronic obstructive pulmonary disease) (HCC), Dyspnea, Obesity, Respiratory arrest (HCC) (04/04/2020), and Sleep apnea.   Surgical History:   Past Surgical History:  Procedure Laterality Date   DENTAL SURGERY     2 teeth pulled, 11/22/14   DILITATION & CURRETTAGE/HYSTROSCOPY WITH NOVASURE ABLATION N/A 03/05/2020   Procedure: DILATATION & CURETTAGE/HYSTEROSCOPY WITH NOVASURE ABLATION;  Surgeon: Richarda Overlie, MD;  Location: [facility];  Service: Gynecology;  Laterality: N/A;   NO PAST SURGERIES     RIGHT/LEFT HEART CATH AND CORONARY ANGIOGRAPHY N/A 06/11/2022   Procedure: RIGHT/LEFT HEART CATH AND CORONARY ANGIOGRAPHY;  Surgeon: Orbie Pyo, MD;  Location: [facility];  Service: Cardiovascular;  Laterality: N/A;     Social History:   reports that she has been smoking cigarettes. She has a 7.50 pack-year smoking history. She has been exposed to tobacco smoke. She has never used smokeless tobacco. She reports current alcohol use.  She reports current drug use. Drug: Marijuana.   Family History:  Her family history  includes Cancer in her mother; Diabetes in her mother and sister; Hypertension in her sister and sister. There is no history of Urticaria, Immunodeficiency, Eczema, Asthma, Angioedema, or Allergic rhinitis.   Allergies Allergies  Allergen Reactions   Penicillins Other (See Comments)    Childhood Allergy Did it involve swelling of the face/tongue/throat, SOB, or low BP? UNK Did it involve sudden or severe rash/hives, skin peeling, or any reaction on the inside of your mouth or nose? UNK Did you need to seek medical attention at a hospital or doctor's office? UNK When did it last happen?      more than 10 years If all above answers are "NO", may proceed with cephalosporin use.    Pineapple Other (See Comments)    Tested allergic to this     Home Medications  Prior to Admission medications   Medication Sig Start Date End Date Taking? Authorizing Provider  albuterol (VENTOLIN HFA) 108 (90 Base) MCG/ACT inhaler Inhale 2 puffs every 4-6 hours as needed 11/26/22   Jetty Duhamel D, MD  buPROPion (WELLBUTRIN XL) 300 MG 24 hr tablet TAKE 1 TABLET BY MOUTH EVERY DAY IN THE MORNING 11/02/22   Arnette Felts, FNP  cetirizine (ZYRTEC) 10 MG tablet Take 10 mg by mouth daily.    [provider]  diphenhydrAMINE (BENADRYL) 25 mg capsule Take 25 mg by mouth daily as needed for allergies.    [provider]  Dupilumab (DUPIXENT) 300 MG/2ML SOPN Inject 300 mg into the skin every 14 (fourteen) days. Loading dose completed in clinic on 12/20/22 12/20/22   Jetty Duhamel D, MD  EPINEPHrine 0.3 mg/0.3 mL IJ SOAJ injection Inject 0.3 mg into the muscle as needed for anaphylaxis. 11/26/22   Waymon Budge, MD  Fluticasone-Umeclidin-Vilant (TRELEGY ELLIPTA) 200-62.5-25 MCG/ACT AEPB Inhale 1 puff then rinse mouth, once daily 10/28/22   Jetty Duhamel D, MD  furosemide (LASIX) 20 MG tablet TAKE 2 TABLETS (40 MG TOTAL) BY MOUTH DAILY. 02/14/23   Sabharwal, Aditya, DO  guaiFENesin (MUCINEX) 600 MG 12 hr tablet  Take 600 mg by mouth as needed for cough or to loosen phlegm. 10/30/22   [provider]  ipratropium-albuterol (DUONEB) 0.5-2.5 (3) MG/3ML SOLN Take 3 mLs by nebulization as needed (for wheezing and shortness of breath). 12/20/22   Jetty Duhamel D, MD  montelukast (SINGULAIR) 10 MG tablet Take 1 tablet (10 mg total) by mouth at bedtime. 12/20/22   Waymon Budge, MD  Multiple Vitamins-Minerals (EMERGEN-C IMMUNE PO) Take 1 packet by mouth daily as needed (cold).    [provider]  nicotine (NICODERM CQ - DOSED IN MG/24 HOURS) 14 mg/24hr patch Place 1 patch (14 mg total) onto the skin daily. 10/28/22 10/28/23  Jetty Duhamel D, MD  nicotine polacrilex (NICORETTE) 4 MG lozenge Take 4 mg by mouth as needed for smoking cessation.    [provider]  predniSONE (DELTASONE) 10 MG tablet 4 X 2 DAYS, 3 X 2 DAYS, 2 X 2 DAYS, 1 X 2 DAYS Patient taking differently: Take 10 mg by mouth daily with breakfast. 4 X 2 DAYS, 3 X 2 DAYS, 2 X 2 DAYS, 1 X 2 DAYS 01/28/23   Young, Joni Fears D, MD  sacubitril-valsartan (ENTRESTO) 24-26 MG Take 1 tablet by mouth 2 (two) times daily. Patient not taking: Reported on 02/09/2023 06/12/22   Cathren Harsh, MD     Critical care time:  40 min        CRITICAL CARE Performed by: Darcella Gasman Eunice Oldaker   Total critical care time: 40 minutes  Critical care time was exclusive of separately billable procedures and treating other patients.  Critical care was necessary to treat or prevent imminent or life-threatening deterioration.  Critical care was time spent personally by me on the following activities: development of treatment plan with patient and/or surrogate as well as nursing, discussions with consultants, evaluation of patient's response to treatment, examination of patient, obtaining history from patient or surrogate, ordering and performing treatments and interventions, ordering and review of laboratory studies, ordering and review of radiographic  studies, pulse oximetry and re-evaluation of patient's condition.   Darcella Gasman Treyana Sturgell, PA-C [facility] See Amion for pager If no response to pager , please call [phone] until 7pm After 7:00 pm call Elink  [phone]

## 2023-02-28 NOTE — Progress Notes (Signed)
RT NOTE: patient does not meet SBT criteria for this AM due to PEEP requirements.  Patient tolerating current ventilator settings well at this time.  Will continue to monitor.

## 2023-02-28 NOTE — ED Provider Notes (Signed)
Procedure Name: Intubation Date/Time: 02/28/2023 3:19 PM  Performed by: Glyn Ade, MDPre-anesthesia Checklist: Patient identified, Patient being monitored, Emergency Drugs available, Timeout performed and Suction available Oxygen Delivery Method: Non-rebreather mask Preoxygenation: Pre-oxygenation with 100% oxygen Induction Type: Rapid sequence Ventilation: Mask ventilation without difficulty Grade View: Grade IV Tube size: 8.0 mm Number of attempts: 1 Airway Equipment and Method: Patient positioned with wedge pillow Placement Confirmation: ETT inserted through vocal cords under direct vision, CO2 detector and Breath sounds checked- equal and bilateral Tube secured with: ETT holder Dental Injury: Teeth and Oropharynx as per pre-operative assessment     .Critical Care  Performed by: Glyn Ade, MD Authorized by: Glyn Ade, MD   Critical care provider statement:    Critical care time (minutes):  85   Critical care was necessary to treat or prevent imminent or life-threatening deterioration of the following conditions:  Cardiac failure and respiratory failure   Critical care was time spent personally by me on the following activities:  Development of treatment plan with patient or surrogate, discussions with consultants, evaluation of patient's response to treatment, examination of patient, ordering and review of laboratory studies, ordering and review of radiographic studies, ordering and performing treatments and interventions, pulse oximetry, re-evaluation of patient's condition and review of old charts     Glyn Ade, MD 02/28/23 1520

## 2023-02-28 NOTE — Progress Notes (Signed)
Initial Nutrition Assessment  DOCUMENTATION CODES:   Morbid obesity  INTERVENTION:   Tube Feeding via OG:  Vital AF 1.2 at 50 ml/hr  Begin TF at 20 ml/hr; titrate by 10 mL q 8 hours until goal rate of 50 ml/hr Pro-source TF20 60 mL BID TF at goal provides 1600 kcals, 121 g of protein and 972 mL of free water   Additional calories from propofol  NUTRITION DIAGNOSIS:   Inadequate oral intake related to acute illness as evidenced by NPO status.  GOAL:   Patient will meet greater than or equal to 90% of their needs  MONITOR:   Vent status, TF tolerance, Labs, Weight trends, Skin  REASON FOR ASSESSMENT:   Consult, Ventilator Enteral/tube feeding initiation and management  ASSESSMENT:   47 yo female admitted post PEA arrest, acute on chronic respiratory failure requiring intubation, possible anoxic brain injury. Pt currently intubated, TTM, sedated. PMH includes COPD, OSA, obesity, CHF  Pt remains on vent support, LTM EEG with myoclonic seizures every few secondary, additionally evidence of severe diffuse encephalopathy, TTM normothermia Propofol: 15 ml/hr (around 400 kcals over 24 hours at current rate)  OG tube in place, terminates in stomach  Unable to obtain diet and weight history at this time  Labs: reviewed Meds: lasix, ss novolog, solumedrol   Diet Order:   Diet Order             Diet NPO time specified  Diet effective now                   EDUCATION NEEDS:   Not appropriate for education at this time  Skin:  Skin Assessment: Skin Integrity Issues: Skin Integrity Issues:: Other (Comment) Other: small hole on coccyx  Last BM:  PTA  Height:   Ht Readings from Last 1 Encounters:  02/27/23 5\' 1"  (1.549 m)    Weight:   Wt Readings from Last 1 Encounters:  02/28/23 (!) 169.2 kg    BMI:  Body mass index is 70.48 kg/m.  Estimated Nutritional Needs:   Kcal:  1500-1700 kcals  Protein:  115-130 g  Fluid:  >/= 1.8 L  Romelle Starcher MS,  RDN, LDN, CNSC Registered Dietitian 3 Clinical Nutrition RD Pager and On-Call Pager Number Located in [place]

## 2023-02-28 NOTE — Progress Notes (Signed)
LTM EEG hooked up and running. Patient is wearing MRI compatible leads.

## 2023-02-28 NOTE — Procedures (Signed)
Patient Name: April Ellis  MRN: [id number]  Epilepsy Attending: Charlsie Quest  Referring Physician/Provider: Gleason, Darcella Gasman, PA-C  Date: 02/28/2023 Duration: 22.41 mins  Patient history: 47yo F with seizure like activity after cardiac arrest. EEG to evaluate for seizure.  Level of alertness:  comatose  AEDs during EEG study: Propofol  Technical aspects: This EEG study was done with scalp electrodes positioned according to the 10-20 International system of electrode placement. Electrical activity was reviewed with band pass filter of 1-70Hz , sensitivity of 7 uV/mm, display speed of 58mm/sec with a 60Hz  notched filter applied as appropriate. EEG data were recorded continuously and digitally stored.  Video monitoring was available and reviewed as appropriate.  Description: Patient was noted to have episodes of brief sudden whole body jerking every few seconds.  Concomitant EEG showed generalized spikes consistent with myoclonic seizures. In between seizures EEG showed continuous generalized 2-2.5Hz  delta slowing. Hyperventilation and photic stimulation were not performed.     ABNORMALITY - Myoclonic seizure, generalized - Continuous slow, generalized  IMPRESSION: Patient was noted to have myoclonic seizures every few seconds.  Additionally there was evidence of severe diffuse encephalopathy.   Dr. Tonia Brooms was notified.   [name]

## 2023-02-28 NOTE — Progress Notes (Signed)
LTM maint complete - no skin breakdown All leads attached . Impedance below [facility] monitored, Event button test confirmed by [facility].

## 2023-02-28 NOTE — Consult Note (Signed)
NEUROLOGY CONSULTATION NOTE   Date of service: February 28, 2023 Patient Name: April Ellis MRN:  [id number] DOB:  [birth year] Reason for consult: "Myoclonic seizures" Requesting Provider: [name], DO _ _ _   _ __   _ __ _ _  __ __   _ __   __ _  History of Present Illness  April Ellis is a 47 y.o. 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 She was apneic and in presumed PEA arrest as EMS were unable to feel a pulse but were unsure. She underwent 5-7 mins of CPR with ROSC. She was intubated in the field.  She was admitted to the ICU where she was noted to have myoclonus when sedation was lightened. 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  Neurology consulted for further evaluation.  She was loaded with Keppra 6000mg  IV once, given Versed 4mg  Iv once and continued on propofol at 11mcg/kg/min.    ROS   Unable to obtain 2/2 obtunded mentation secondary to intubation and sedation.  Past History   Past Medical History:  Diagnosis Date   Angio-edema    Asthma    Bronchitis    CHF (congestive heart failure) (HCC)    COPD (chronic obstructive pulmonary disease) (HCC)    Dyspnea    with asthma flair   Obesity    Respiratory arrest (HCC) 04/04/2020   Sleep apnea    has cpap   Past Surgical History:  Procedure Laterality Date   DENTAL SURGERY     2 teeth pulled, 11/22/14   DILITATION & CURRETTAGE/HYSTROSCOPY WITH NOVASURE ABLATION N/A 03/05/2020   Procedure: DILATATION & CURETTAGE/HYSTEROSCOPY WITH NOVASURE ABLATION;  Surgeon: Richarda Overlie, MD;  Location: [facility];  Service: Gynecology;  Laterality: N/A;   NO PAST SURGERIES     RIGHT/LEFT HEART CATH AND CORONARY ANGIOGRAPHY N/A 06/11/2022   Procedure: RIGHT/LEFT HEART CATH AND CORONARY ANGIOGRAPHY;  Surgeon: Orbie Pyo, MD;  Location: [facility];  Service: Cardiovascular;  Laterality: N/A;   Family History   Problem Relation Age of Onset   Diabetes Mother    Cancer Mother    Diabetes Sister    Hypertension Sister    Hypertension Sister    Urticaria Neg Hx    Immunodeficiency Neg Hx    Eczema Neg Hx    Asthma Neg Hx    Angioedema Neg Hx    Allergic rhinitis Neg Hx    Social History   Socioeconomic History   Marital status: Single    Spouse name: Not on file   Number of children: Not on file   Years of education: Not on file   Highest education level: Not on file  Occupational History   Occupation: customer service rep  Tobacco Use   Smoking status: Every Day    Packs/day: 0.50    Years: 15.00    Additional pack years: 0.00    Total pack years: 7.50    Types: Cigarettes    Last attempt to quit: 11/19/2022    Years since quitting: 0.2    Passive exposure: Past   Smokeless tobacco: Never   Tobacco comments:    Smokes 4 cigarettes per week PAP 01/28/23  Vaping Use   Vaping Use: Some days   Substances: Nicotine  Substance and Sexual Activity   Alcohol use: Yes    Alcohol/week: 0.0 standard drinks of alcohol    Comment:  occas.   Drug use: Yes    Types: Marijuana    Comment: occas.   Sexual activity: Not Currently  Other Topics Concern   Not on file  Social History Narrative   ** Merged History Encounter **       DIET: regular  DO YOU DRINK/EAT THINGS WITH CAFFEINE: Yes  MARITAL STATUS: Single  WHAT YEAR WERE YOU MARRIED:   DO YOU LIVE IN A HOUSE, APARTMENT, ASSISTED LIVING, CONDO TRAILER ETC.:  House   IS IT ONE OR MORE STORIES: 1  HOW MANY PERSONS L   IVE IN YOUR HOME: 3  DO YOU HAVE PETS IN YOUR HOME: No  CURRENT OR PAST PROFESSION: customer Service Rep  DO YOU EXERCISE: very little  WHAT TYPE AND HOW OFTEN:   Social Determinants of Health   Financial Resource Strain: Not on file  Food Insecurity: No Food Insecurity (12/07/2022)   Hunger Vital Sign    Worried About Running Out of Food in the Last Year: Never true    Ran Out of Food in the Last  Year: Never true  Transportation Needs: No Transportation Needs (12/07/2022)   PRAPARE - Administrator, Civil Service (Medical): No    Lack of Transportation (Non-Medical): No  Physical Activity: Not on file  Stress: Not on file  Social Connections: Not on file   Allergies  Allergen Reactions   Penicillins Other (See Comments)    Childhood Allergy Did it involve swelling of the face/tongue/throat, SOB, or low BP? UNK Did it involve sudden or severe rash/hives, skin peeling, or any reaction on the inside of your mouth or nose? UNK Did you need to seek medical attention at a hospital or doctor's office? UNK When did it last happen?      more than 10 years If all above answers are "NO", may proceed with cephalosporin use.    Pineapple Other (See Comments)    Tested allergic to this    Medications   Medications Prior to Admission  Medication Sig Dispense Refill Last Dose   bisoprolol (ZEBETA) 5 MG tablet Take 2.5 mg by mouth daily.   unknown   budesonide-formoterol (SYMBICORT) 160-4.5 MCG/ACT inhaler Inhale 2 puffs into the lungs 2 (two) times daily.   unknown   fluticasone (FLONASE) 50 MCG/ACT nasal spray Place 2 sprays into both nostrils daily.   unknown   albuterol (VENTOLIN HFA) 108 (90 Base) MCG/ACT inhaler Inhale 2 puffs every 4-6 hours as needed 18 g 12 unknown   buPROPion (WELLBUTRIN XL) 300 MG 24 hr tablet TAKE 1 TABLET BY MOUTH EVERY DAY IN THE MORNING 90 tablet 1 unknown   cetirizine (ZYRTEC) 10 MG tablet Take 10 mg by mouth daily.   unknown   diphenhydrAMINE (BENADRYL) 25 mg capsule Take 25 mg by mouth daily as needed for allergies.   unknown   Dupilumab (DUPIXENT) 300 MG/2ML SOPN Inject 300 mg into the skin every 14 (fourteen) days. Loading dose completed in clinic on 12/20/22 12 mL 1 unknown   EPINEPHrine 0.3 mg/0.3 mL IJ SOAJ injection Inject 0.3 mg into the muscle as needed for anaphylaxis. 2 each 5 unknown   Fluticasone-Umeclidin-Vilant (TRELEGY ELLIPTA)  200-62.5-25 MCG/ACT AEPB Inhale 1 puff then rinse mouth, once daily 60 each 12 unknown   furosemide (LASIX) 20 MG tablet TAKE 2 TABLETS (40 MG TOTAL) BY MOUTH DAILY. 180 tablet 1 unknown   guaiFENesin (MUCINEX) 600 MG 12 hr tablet Take 600 mg by mouth as needed for cough  or to loosen phlegm.   unknown   ipratropium-albuterol (DUONEB) 0.5-2.5 (3) MG/3ML SOLN Take 3 mLs by nebulization as needed (for wheezing and shortness of breath). 360 mL 5 unknown   montelukast (SINGULAIR) 10 MG tablet Take 1 tablet (10 mg total) by mouth at bedtime. 90 tablet 3 unknown   Multiple Vitamins-Minerals (EMERGEN-C IMMUNE PO) Take 1 packet by mouth daily as needed (cold).   unknown   nicotine (NICODERM CQ - DOSED IN MG/24 HOURS) 14 mg/24hr patch Place 1 patch (14 mg total) onto the skin daily. 30 patch 5 unknown   nicotine polacrilex (NICORETTE) 4 MG lozenge Take 4 mg by mouth as needed for smoking cessation.   unknown     Vitals   Vitals:   02/28/23 1030 02/28/23 1045 02/28/23 1100 02/28/23 1126  BP:      Pulse: 68 64 65   Resp: (!) 25 (!) 9 (!) 23   Temp:      TempSrc:      SpO2: 99% 100% 99% 99%  Weight:      Height:         Body mass index is 70.48 kg/m.  Physical Exam   General: Laying comfortably in bed; intubated, obese. HENT: Normal oropharynx and mucosa. Normal external appearance of ears and nose.  Neck: Supple, no pain or tenderness  CV: No JVD. No peripheral edema.  Pulmonary: Symmetric Chest rise. Breathing over vent. Abdomen: Soft to touch, non-tender.  Ext: No cyanosis, edema, or deformity  Skin: No rash. Normal palpation of skin.   Musculoskeletal: Normal digits and nails by inspection. No clubbing.   Neurologic Examination  Mental status/Cognition: No response to voice or loud clap or to tactile or noxious stimuli.  Intermittently having full body myoclonic jerks that are brief, lasting less than a second and occurring every few seconds.  Speech/language: Mute, no speech, no  attempts to communicate.   Cranial nerves:   CN II Pupils equal and reactive to light, unable to assess for visual field deficits.   CN III,IV,VI EOM intact to doll's eye.   CN V Corneals intact bilaterally   CN VII No facial grimace noted to noxious stimuli.   CN VIII Does not turn head towards speech.   CN IX & X Cough intact, no gag.   CN XI Head is midline.   CN XII Tongue midline despite ETT.   Motor/sensory:  Muscle bulk: Normal, tone flaccid in all extremities. No response to proximal pinch in any of the extremities.  Reflexes:  Right Left Comments  Pectoralis      Biceps (C5/6) - - Unable to elicit likely due to body habitus.  Brachioradialis (C5/6) 2 2    Triceps (C6/7) 2 2    Patellar (L3/4) - -    Achilles (S1) 1 1    Hoffman      Plantar mute mute   Jaw jerk    Coordination/Complex Motor:  -Unable to assess. Labs   CBC:  Recent Labs  Lab 02/27/23 1618 02/27/23 1628 02/27/23 2018 02/27/23 2020 02/28/23 0415  WBC 13.6*  --  16.8*  --  12.5*  NEUTROABS 8.7*  --   --   --   --   HGB 14.0   < > 14.6 15.6* 13.7  15.0  HCT 45.6   < > 46.2* 46.0 44.8  44.0  MCV 99.3  --  96.0  --  95.9  PLT 277  --  245  --  238   < > =  values in this interval not displayed.    Basic Metabolic Panel:  Lab Results  Component Value Date   NA 135 02/28/2023   NA 136 02/28/2023   K 4.7 02/28/2023   K 5.1 02/28/2023   CO2 26 02/28/2023   GLUCOSE 130 (H) 02/28/2023   BUN 10 02/28/2023   CREATININE 0.67 02/28/2023   CALCIUM 7.9 (L) 02/28/2023   GFRNONAA >60 02/28/2023   GFRAA 108 04/14/2020   Lipid Panel:  Lab Results  Component Value Date   LDLCALC 41 07/19/2022   HgbA1c:  Lab Results  Component Value Date   HGBA1C 5.0 04/21/2022   Urine Drug Screen:     Component Value Date/Time   LABOPIA NONE DETECTED 11/13/2022 0441   COCAINSCRNUR NONE DETECTED 11/13/2022 0441   LABBENZ NONE DETECTED 11/13/2022 0441   AMPHETMU NONE DETECTED 11/13/2022 0441   THCU NONE  DETECTED 11/13/2022 0441   LABBARB NONE DETECTED 11/13/2022 0441    Alcohol Level     Component Value Date/Time   ETH <10 11/12/2022 2224    CT Head without contrast(Personally reviewed): Possible left cerebellum stroke but concern that this is likely artifact.  No hemorrhage.  MRI Brain: pending  rEEG:  Patient was noted to have myoclonic seizures every few seconds. Additionally there was evidence of severe diffuse encephalopathy.   Impression   April Ellis is a 47 y.o. 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 She was apneic and in presumed PEA arrest as EMS were unable to feel a pulse but were unsure. She underwent 5-7 mins of CPR with ROSC. She was intubated in the field.  She was admitted to the ICU where she was noted to have myoclonus when sedation was lightened. 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  She was loaded with Keppra 6 g, given 4 mg of Versed, 40 mg propofol bolus and maintenance propofol was increased to 80 mcg/kg/min.  Recommendations  - Keppra 6000mg  IV once, followed by Keppra 1500mg  BID - Versed 4mg  Iv once - Propofol bolus of 40mg  Iv once, increased maintenance propofol to 58mcg/Kg/min - LTM EEG with MRI compatible leads. - MRI Brain without contrast between day 3-5 - next step for persistent myoclonic seizures, will use Valproic acid and phenobarb. ______________________________________________________________________  This patient is critically ill and at significant risk of neurological worsening, death and care requires constant monitoring of vital signs, hemodynamics,respiratory and cardiac monitoring, neurological assessment, discussion with family, other specialists and medical decision making of high complexity. I spent 50 minutes of neurocritical care time  in the care of  this patient. This was time spent independent of any time provided by nurse  practitioner or PA.  Erick Blinks [facility] 02/28/2023  1:11 PM   Thank you for the opportunity to take part in the care of this patient. If you have any further questions, please contact the neurology consultation attending.  Signed,  Erick Blinks [facility] _ _ _   _ __   _ __ _ _  __ __   _ __   __ _

## 2023-02-28 NOTE — Progress Notes (Signed)
EEG complete - results pending 

## 2023-03-01 ENCOUNTER — Inpatient Hospital Stay (HOSPITAL_COMMUNITY): Payer: BC Managed Care – PPO

## 2023-03-01 DIAGNOSIS — G40409 Other generalized epilepsy and epileptic syndromes, not intractable, without status epilepticus: Secondary | ICD-10-CM | POA: Diagnosis not present

## 2023-03-01 DIAGNOSIS — R569 Unspecified convulsions: Secondary | ICD-10-CM | POA: Diagnosis not present

## 2023-03-01 DIAGNOSIS — Z9911 Dependence on respirator [ventilator] status: Secondary | ICD-10-CM

## 2023-03-01 DIAGNOSIS — J9601 Acute respiratory failure with hypoxia: Secondary | ICD-10-CM | POA: Diagnosis not present

## 2023-03-01 DIAGNOSIS — I469 Cardiac arrest, cause unspecified: Secondary | ICD-10-CM | POA: Diagnosis not present

## 2023-03-01 LAB — GLUCOSE, CAPILLARY
Glucose-Capillary: 101 mg/dL — ABNORMAL HIGH (ref 70–99)
Glucose-Capillary: 126 mg/dL — ABNORMAL HIGH (ref 70–99)
Glucose-Capillary: 137 mg/dL — ABNORMAL HIGH (ref 70–99)
Glucose-Capillary: 151 mg/dL — ABNORMAL HIGH (ref 70–99)
Glucose-Capillary: 159 mg/dL — ABNORMAL HIGH (ref 70–99)

## 2023-03-01 LAB — MAGNESIUM: Magnesium: 2.3 mg/dL (ref 1.7–2.4)

## 2023-03-01 LAB — COMPREHENSIVE METABOLIC PANEL
ALT: 87 U/L — ABNORMAL HIGH (ref 0–44)
AST: 39 U/L (ref 15–41)
Albumin: 2.7 g/dL — ABNORMAL LOW (ref 3.5–5.0)
Alkaline Phosphatase: 59 U/L (ref 38–126)
Anion gap: 7 (ref 5–15)
BUN: 13 mg/dL (ref 6–20)
CO2: 28 mmol/L (ref 22–32)
Calcium: 8.1 mg/dL — ABNORMAL LOW (ref 8.9–10.3)
Chloride: 105 mmol/L (ref 98–111)
Creatinine, Ser: 0.79 mg/dL (ref 0.44–1.00)
GFR, Estimated: >60 mL/min (ref >60)
Glucose, Bld: 153 mg/dL — ABNORMAL HIGH (ref 70–99)
Potassium: 4 mmol/L (ref 3.5–5.1)
Sodium: 140 mmol/L (ref 135–145)
Total Bilirubin: 0.6 mg/dL (ref 0.3–1.2)
Total Protein: 6 g/dL — ABNORMAL LOW (ref 6.5–8.1)

## 2023-03-01 LAB — POCT I-STAT 7, (LYTES, BLD GAS, ICA,H+H)
Acid-Base Excess: 5 mmol/L — ABNORMAL HIGH (ref 0.0–2.0)
Bicarbonate: 31.2 mmol/L — ABNORMAL HIGH (ref 20.0–28.0)
Calcium, Ion: 1.18 mmol/L (ref 1.15–1.40)
HCT: 45 % (ref 36.0–46.0)
Hemoglobin: 15.3 g/dL — ABNORMAL HIGH (ref 12.0–15.0)
O2 Saturation: 96 %
Patient temperature: 98
Potassium: 4.2 mmol/L (ref 3.5–5.1)
Sodium: 142 mmol/L (ref 135–145)
TCO2: 33 mmol/L — ABNORMAL HIGH (ref 22–32)
pCO2 arterial: 51.4 mmHg — ABNORMAL HIGH (ref 32–48)
pH, Arterial: 7.39 (ref 7.35–7.45)
pO2, Arterial: 82 mmHg — ABNORMAL LOW (ref 83–108)

## 2023-03-01 LAB — CBC
HCT: 42.2 % (ref 36.0–46.0)
Hemoglobin: 13.5 g/dL (ref 12.0–15.0)
MCH: 30.5 pg (ref 26.0–34.0)
MCHC: 32 g/dL (ref 30.0–36.0)
MCV: 95.3 fL (ref 80.0–100.0)
Platelets: 273 10*3/uL (ref 150–400)
RBC: 4.43 MIL/uL (ref 3.87–5.11)
RDW: 13.2 % (ref 11.5–15.5)
WBC: 14.9 10*3/uL — ABNORMAL HIGH (ref 4.0–10.5)
nRBC: 0 % (ref 0.0–0.2)

## 2023-03-01 LAB — PHOSPHORUS: Phosphorus: 3.9 mg/dL (ref 2.5–4.6)

## 2023-03-01 LAB — LEGIONELLA PNEUMOPHILA SEROGP 1 UR AG: L. pneumophila Serogp 1 Ur Ag: NEGATIVE

## 2023-03-01 LAB — BRAIN NATRIURETIC PEPTIDE: B Natriuretic Peptide: 92.6 pg/mL (ref 0.0–100.0)

## 2023-03-01 MED ORDER — REVEFENACIN 175 MCG/3ML IN SOLN
175.0000 ug | Freq: Every day | RESPIRATORY_TRACT | Status: DC
  Administered 2023-03-01 – 2023-03-14 (×14): 175 ug via RESPIRATORY_TRACT
  Filled 2023-03-01 (×14): qty 3

## 2023-03-01 MED ORDER — ARFORMOTEROL TARTRATE 15 MCG/2ML IN NEBU
15.0000 ug | INHALATION_SOLUTION | Freq: Two times a day (BID) | RESPIRATORY_TRACT | Status: DC
  Administered 2023-03-01 – 2023-03-14 (×26): 15 ug via RESPIRATORY_TRACT
  Filled 2023-03-01 (×27): qty 2

## 2023-03-01 MED ORDER — POTASSIUM CHLORIDE 20 MEQ PO PACK
40.0000 meq | PACK | Freq: Once | ORAL | Status: AC
  Administered 2023-03-01: 40 meq
  Filled 2023-03-01: qty 2

## 2023-03-01 MED ORDER — ALBUTEROL SULFATE (2.5 MG/3ML) 0.083% IN NEBU
2.5000 mg | INHALATION_SOLUTION | RESPIRATORY_TRACT | Status: DC | PRN
  Administered 2023-03-07: 2.5 mg via RESPIRATORY_TRACT
  Filled 2023-03-01 (×2): qty 3

## 2023-03-01 NOTE — Progress Notes (Signed)
NEUROLOGY CONSULTATION PROGRESS NOTE   Date of service: March 01, 2023 Patient Name: April SPINALE MRN:  [id number] DOB:  1976-02-01  Brief HPI  April Ellis is a 47 y.o. 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 She was apneic and in presumed PEA arrest as EMS were unable to feel a pulse but were unsure. She underwent 5-7 mins of CPR with ROSC. She was intubated in the field.   She was admitted to the ICU where she was noted to have myoclonus when sedation was lightened. 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   Interval Hx   Seizures controlled on LTM overnight, gradually abated after 1500.  Vitals   Vitals:   03/01/23 0807 03/01/23 0822 03/01/23 0853 03/01/23 0900  BP:  138/64  (!) 132/53  Pulse:    61  Resp:    (!) 28  Temp:  (!) 97.5 F (36.4 C)    TempSrc:  Axillary    SpO2: 100%  99% 95%  Weight:      Height:         Body mass index is 71.56 kg/m.  Physical Exam  General: Laying comfortably in bed; intubated, obese. HENT: Normal oropharynx and mucosa. Normal external appearance of ears and nose.  Neck: Supple, no pain or tenderness  CV: No JVD. No peripheral edema.  Pulmonary: Symmetric Chest rise. Breathing over vent. Abdomen: Soft to touch, non-tender.  Ext: No cyanosis, edema, or deformity  Skin: No rash. Normal palpation of skin.   Musculoskeletal: Normal digits and nails by inspection. No clubbing.    Neurologic Examination  Mental status/Cognition: No response to voice or loud clap or to tactile or noxious stimuli.  No clinical myoclonus. Speech/language: Mute, no speech, no attempts to communicate.   Cranial nerves:   CN II Pupils equal and reactive to light, unable to assess for visual field deficits.   CN III,IV,VI EOM intact to doll's eye.   CN V Corneals intact bilaterally   CN VII No facial grimace noted to noxious stimuli.   CN VIII Does not turn  head towards speech.   CN IX & X No cough, no gag.   CN XI Head is midline.   CN XII Tongue midline despite ETT.    Motor/sensory:  Muscle bulk: Normal, tone flaccid in all extremities. No response to proximal pinch in any of the extremities.   Reflexes:   Right Left Comments  Pectoralis         Biceps (C5/6) - - Unable to elicit likely due to body habitus.  Brachioradialis (C5/6) 2 2     Triceps (C6/7) 2 2     Patellar (L3/4) - -     Achilles (S1) 1 1     Hoffman         Plantar mute mute    Jaw jerk       Coordination/Complex Motor:  -Unable to assess.  Labs   Basic Metabolic Panel:  Lab Results  Component Value Date   NA 140 03/01/2023   K 4.0 03/01/2023   CO2 28 03/01/2023   GLUCOSE 153 (H) 03/01/2023   BUN 13 03/01/2023   CREATININE 0.79 03/01/2023   CALCIUM 8.1 (L) 03/01/2023   GFRNONAA >60 03/01/2023   GFRAA 108 04/14/2020   HbA1c:  Lab Results  Component Value Date   HGBA1C 4.8 02/27/2023   LDL:  Lab Results  Component  Value Date   LDLCALC 41 07/19/2022   Urine Drug Screen:     Component Value Date/Time   LABOPIA NONE DETECTED 11/13/2022 0441   COCAINSCRNUR NONE DETECTED 11/13/2022 0441   LABBENZ NONE DETECTED 11/13/2022 0441   AMPHETMU NONE DETECTED 11/13/2022 0441   THCU NONE DETECTED 11/13/2022 0441   LABBARB NONE DETECTED 11/13/2022 0441    Alcohol Level     Component Value Date/Time   ETH <10 11/12/2022 2224   No results found for: "PHENYTOIN", "ZONISAMIDE", "LAMOTRIGINE", "LEVETIRACETA" No results found for: "PHENYTOIN", "PHENOBARB", "VALPROATE", "CBMZ"  Imaging and Diagnostic studies   CT head w/o contrast: 1. Possible hypodensity in the left cerebellum, although this area is quite prone to artifact. An MRI could provide further evaluation. 2. No other findings suggestive of acute infarct. 3. No acute intracranial hemorrhage.  Impression   April Ellis is a 47 y.o. female with PMH significant for COPD, OSA, who was found  unresponsive outside a hair dresser. She was apneic and in presumed PEA arrest as EMS were unable to feel a pulse but were unsure. She underwent 5-7 mins of CPR with ROSC. She was intubated in the field.   She was admitted to the ICU where she was noted to have myoclonus when sedation was lightened. 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  Myoclonic seizures resolved around 1500 on 02/28/23 with propofol 80, versed 10, Keppra 1500 BID.  Recommendations  - Continue Keppra 1500 BID - Continue versed 10mg /Hr today - Plan is to gradually wean down propofol by 10 every hour until off. Will continue LTM EEG overnight and if no seizure, will plan to wean off versed tomorrow. - MRI Brain without contrast between day 3-5. ______________________________________________________________________  This patient is critically ill and at significant risk of neurological worsening, death and care requires constant monitoring of vital signs, hemodynamics,respiratory and cardiac monitoring, neurological assessment, discussion with family, other specialists and medical decision making of high complexity. I spent 35 minutes of neurocritical care time  in the care of  this patient. This was time spent independent of any time provided by nurse practitioner or PA.  Erick Blinks [facility] 03/01/2023  11:51 AM   Thank you for the opportunity to take part in the care of this patient. If you have any further questions, please contact the neurology consultation attending.  Signed,  Erick Blinks [facility]

## 2023-03-01 NOTE — Progress Notes (Signed)
NAME:  April Ellis, MRN:  [id number], DOB:  05/26/1976, LOS: 2 ADMISSION DATE:  02/27/2023, CONSULTATION DATE: 6/2 REFERRING MD: Doran Durand, CHIEF COMPLAINT: Cardiac arrest  History of Present Illness:  This is a 47 year old female patient With known history of super obesity, asthma/COPD overlap, complex apnea both central and obstructive followed by Dr. Maple Hudson.  She was actually seen in our office on 5/3 at which time she was counseled to stop smoking, and was given a prednisone taper for possible asthmatic exacerbation. Patient was brought in by EMS on 6/2 after being found unresponsive outside of [employer].  Per EMS report she was initially apneic and pulseless she underwent 5 to 7 minutes of ACLS prior to return of spontaneous circulation she was intubated on the scene, after the fact EMS was unsure about whether or not she was truly pulseless.  Apparently following intubation she was agitated and combative requiring Versed.  Initial arterial blood gas pH 7.26, pCO2 70 pO2 534 HCO3 31.  White blood cell count 13.6, lactate 2.6.  Respiratory virus panel negative.  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 rate (!) 28, height 5\' 1"  (1.549 m), weight (!) 171.8 kg, SpO2 97 %.    Vent Mode: PRVC FiO2 (%):  [30 %] 30 % Set Rate:  [28 bmp] 28 bmp Vt Set:  [380 mL] 380 mL PEEP:  [10 cmH20] 10 cmH20 Plateau Pressure:  [22 cmH20-23 cmH20] 23 cmH20   Intake/Output Summary (Last 24 hours) at 03/01/2023 0750 Last data filed at 03/01/2023 0600 Gross per 24 hour  Intake 4129.11 ml  Output 7490 ml  Net -3360.89 ml    Filed Weights   02/27/23 2015 02/28/23 0600 03/01/23 0404  Weight: (!) 169.2 kg (!) 169.2 kg (!) 171.8 kg   General:  morbidly obese F, intubated and sedated HEENT: MM pink/moist, ETT in place sclera anicteric Neuro: examined on sedation RASS -5, no myoclonus CV: s1s2 rrr, no m/r/g PULM:  mechanically ventilated with bilateral wheezing, PRVC 30% PEEP 8 GI: soft, bsx4 active, obese  Extremities: warm/dry, no edema  Skin: no rashes or lesions  Labs: Glu 153 Creatinine 0.79 Trop 77>100>123 K 4.0 Mag 2.3  Resolved Hospital Problem list     Assessment & Plan:    Acute on chronic hypoxic and hypercarbic respiratory failure secondary to mixed picture of acute exacerbation of chronic obstructive pulmonary disease and decompensated Diastolic heart failure, superimposed on morbid obesity and obstructive sleep apnea Pt's sister reports multiple recent episodes of severe shortness of breath causing syncope and requiring EMS assistance, she continues to smoke -seizures and mental status preclude extubation -covid, flu negative -has been on CAP coverage, repeat CXR and if no infiltrate consider discontinuing  -doubt PE as was hemodynamically stable and no significant BNP or troponin rise  -continue scheduled  bronchodilators and steroids  -continue diuresis, echo with preserved EF  -hold Entresto for now -normothermia, euglycemia -propofol, versed and fentanyl for PAD protocol --Maintain full vent support with SAT/SBT as tolerated -titrate Vent setting to maintain SpO2 greater  than or equal to 90%. -HOB elevated 30 degrees. -Plateau pressures less than 30 cm H20.  -Follow chest x-ray, ABG prn.   -Bronchial hygiene and RT/bronchodilator protocol.   Unwitnessed out of hospital likely PEA Cardiac arrest Acute Encephalopathy Myoclonic seizures  Felt PEA on arrival although EMS not entirely clear if pulseless or not.  Return of spontaneous circulation estimated at 5 to 7 minutes.  Required sedation en route to the hospital for agitation and combativeness -suspect significant hypoxic ischemic injury as she has myoclonic seizures, appreciate Neurology assistance, continue Keppra, Propofol and Versed.   -CTH with possible possible hypodensity in the L cerebellum, -EEG -MRI at 72hrs -continue holding Wellbutrin  Hyperkalemia, mild hypocalcemia -trend and replete prn  Hyperglycemia -Sliding scale insulin   GOC:  Pt's son, who is 75, is nearest relative.  Pt's sister states that he will likely not be able to handle making difficult decisions for patient and will likely defer to her. [name].  We discussed the concern that patient has likely sustained a hypoxic ischemic injury.  [name] stated pt would not have wanted to live long-term on life support.  We discussed both DNR and transitioning to comfort care in the coming days if patient is not showing signs of neurologic recovery.  She will discuss with family.    Best Practice (right click and "Reselect all SmartList Selections" daily)   Diet/type: tubefeeds DVT prophylaxis: prophylactic heparin  GI prophylaxis: PPI Lines: N/A Foley:  Yes, and it is still needed Code Status:  full code Last date of multidisciplinary goals of care discussion [family update pending 6/4]  Labs   CBC: Recent Labs  Lab 02/27/23 1618 02/27/23 1628 02/27/23 1707 02/27/23 2018 02/27/23 2020 02/28/23 0415 03/01/23 0350  WBC 13.6*  --   --  16.8*  --  12.5* 14.9*  NEUTROABS 8.7*  --   --   --   --   --   --   HGB 14.0   < >  14.6 14.6 15.6* 13.7  15.0 13.5  HCT 45.6   < > 43.0 46.2* 46.0 44.8  44.0 42.2  MCV 99.3  --   --  96.0  --  95.9 95.3  PLT 277  --   --  245  --  238 273   < > = values in this interval not displayed.     Basic Metabolic Panel: Recent Labs  Lab 02/27/23 1618 02/27/23 1628 02/27/23 1707 02/27/23 2018 02/27/23 2020 02/28/23 0415 03/01/23 0350  NA 137 138 137  --  138 135  136 140  K 5.1 4.7 5.3*  --  4.5 4.7  5.1 4.0  CL 101  --   --   --   --  101 105  CO2 23  --   --   --   --  26 28  GLUCOSE 185*  --   --   --   --  130* 153*  BUN 13  --   --   --   --  10 13  CREATININE 0.98  --   --  0.84  --  0.67 0.79  CALCIUM 7.8*  --   --   --   --  7.9* 8.1*  MG  --   --   --   --   --  2.4 2.3  PHOS  --   --   --   --   --   --  3.9    GFR: Estimated Creatinine Clearance: 136.5 mL/min (by C-G formula based on SCr of 0.79 mg/dL). Recent Labs  Lab 02/27/23 1615 02/27/23 1618 02/27/23 1800 02/27/23 2018 02/28/23 0415 03/01/23 0350  PROCALCITON  --   --   --  <0.10  --   --   WBC  --  13.6*  --  16.8* 12.5* 14.9*  LATICACIDVEN 2.6*  --  1.3  --   --   --      Liver Function Tests: Recent Labs  Lab 02/27/23 1618 03/01/23 0350  AST 129* 39  ALT 134* 87*  ALKPHOS 81 59  BILITOT 1.3* 0.6  PROT 6.0* 6.0*  ALBUMIN 3.1* 2.7*    No results for input(s): "LIPASE", "AMYLASE" in the last 168 hours. No results for input(s): "AMMONIA" in the last 168 hours.  ABG    Component Value Date/Time   PHART 7.312 (L) 02/28/2023 0415   PCO2ART 56.0 (H) 02/28/2023 0415   PO2ART 85 02/28/2023 0415   HCO3 28.4 (H) 02/28/2023 0415   TCO2 30 02/28/2023 0415   ACIDBASEDEF 2.0 02/27/2023 1628   O2SAT 95 02/28/2023 0415     Coagulation Profile: No results for input(s): "INR", "PROTIME" in the last 168 hours.  Cardiac Enzymes: No results for input(s): "CKTOTAL", "CKMB", "CKMBINDEX", "TROPONINI" in the last 168 hours.  HbA1C: Hgb A1c MFr Bld  Date/Time Value Ref Range  Status  02/27/2023 08:18 PM 4.8 4.8 - 5.6 % Final    Comment:    (NOTE)         Prediabetes: 5.7 - 6.4         Diabetes: >6.4         Glycemic control for adults with diabetes: <7.0   04/21/2022 04:17 PM 5.0 4.8 - 5.6 % Final    Comment:             Prediabetes: 5.7 - 6.4          Diabetes: >6.4          Glycemic control for adults with diabetes: <7.0     CBG: Recent Labs  Lab 02/28/23 1255 02/28/23 1710 02/28/23 2014 02/28/23 2310 03/01/23 0356  GLUCAP 120* 136* 147* 155* 159*     Review of Systems:   Not able   Past Medical History:  She,  has a past medical history of Angio-edema, Asthma, Bronchitis, CHF (congestive heart failure) (HCC), COPD (chronic obstructive pulmonary disease) (HCC), Dyspnea, Obesity, Respiratory arrest (HCC) (04/04/2020), and Sleep apnea.   Surgical History:   Past Surgical History:  Procedure Laterality Date   DENTAL SURGERY     2 teeth pulled, 11/22/14   DILITATION & CURRETTAGE/HYSTROSCOPY WITH NOVASURE ABLATION N/A 03/05/2020   Procedure: DILATATION & CURETTAGE/HYSTEROSCOPY WITH NOVASURE ABLATION;  Surgeon: Richarda Overlie, MD;  Location: [facility];  Service: Gynecology;  Laterality: N/A;   NO PAST SURGERIES     RIGHT/LEFT HEART CATH AND CORONARY ANGIOGRAPHY N/A 06/11/2022   Procedure: RIGHT/LEFT HEART CATH AND CORONARY ANGIOGRAPHY;  Surgeon: Orbie Pyo, MD;  Location: [facility];  Service: Cardiovascular;  Laterality: N/A;     Social History:   reports that she has been smoking cigarettes. She has a 7.50 pack-year smoking history. She has been exposed to tobacco smoke. She has never used smokeless tobacco. She reports current alcohol use. She reports current drug use.  Drug: Marijuana.   Family History:  Her family history includes Cancer in her mother; Diabetes in her mother and sister; Hypertension in her sister and sister. There is no history of Urticaria, Immunodeficiency, Eczema, Asthma, Angioedema, or Allergic rhinitis.    Allergies Allergies  Allergen Reactions   Penicillins Other (See Comments)    Childhood Allergy Did it involve swelling of the face/tongue/throat, SOB, or low BP? UNK Did it involve sudden or severe rash/hives, skin peeling, or any reaction on the inside of your mouth or nose? UNK Did you need to seek medical attention at a hospital or doctor's office? UNK When did it last happen?      more than 10 years If all above answers are "NO", may proceed with cephalosporin use.    Pineapple Other (See Comments)    Tested allergic to this     Home Medications  Prior to Admission medications   Medication Sig Start Date End Date Taking? Authorizing Provider  albuterol (VENTOLIN HFA) 108 (90 Base) MCG/ACT inhaler Inhale 2 puffs every 4-6 hours as needed 11/26/22   Jetty Duhamel D, MD  buPROPion (WELLBUTRIN XL) 300 MG 24 hr tablet TAKE 1 TABLET BY MOUTH EVERY DAY IN THE MORNING 11/02/22   Arnette Felts, FNP  cetirizine (ZYRTEC) 10 MG tablet Take 10 mg by mouth daily.    [provider]  diphenhydrAMINE (BENADRYL) 25 mg capsule Take 25 mg by mouth daily as needed for allergies.    [provider]  Dupilumab (DUPIXENT) 300 MG/2ML SOPN Inject 300 mg into the skin every 14 (fourteen) days. Loading dose completed in clinic on 12/20/22 12/20/22   Jetty Duhamel D, MD  EPINEPHrine 0.3 mg/0.3 mL IJ SOAJ injection Inject 0.3 mg into the muscle as needed for anaphylaxis. 11/26/22   Waymon Budge, MD  Fluticasone-Umeclidin-Vilant (TRELEGY ELLIPTA) 200-62.5-25 MCG/ACT AEPB Inhale 1 puff then rinse mouth, once daily 10/28/22   Jetty Duhamel D, MD  furosemide (LASIX) 20 MG tablet TAKE 2 TABLETS (40 MG TOTAL) BY MOUTH DAILY. 02/14/23   Sabharwal, Aditya, DO  guaiFENesin (MUCINEX) 600 MG 12 hr tablet Take 600 mg by mouth as needed for cough or to loosen phlegm. 10/30/22   [provider]  ipratropium-albuterol (DUONEB) 0.5-2.5 (3) MG/3ML SOLN Take 3 mLs by nebulization as needed (for wheezing  and shortness of breath). 12/20/22   Jetty Duhamel D, MD  montelukast (SINGULAIR) 10 MG tablet Take 1 tablet (10 mg total) by mouth at bedtime. 12/20/22   Waymon Budge, MD  Multiple Vitamins-Minerals (EMERGEN-C IMMUNE PO) Take 1 packet by mouth daily as needed (cold).    [provider]  nicotine (NICODERM CQ - DOSED IN MG/24 HOURS) 14 mg/24hr patch Place 1 patch (14 mg total) onto the skin daily. 10/28/22 10/28/23  Jetty Duhamel D, MD  nicotine polacrilex (NICORETTE) 4 MG lozenge Take 4 mg by mouth as needed for smoking cessation.    [provider]  predniSONE (DELTASONE) 10 MG tablet 4 X 2 DAYS, 3 X 2 DAYS, 2 X 2 DAYS, 1 X 2 DAYS Patient taking differently: Take 10 mg by mouth daily with breakfast. 4 X 2 DAYS, 3 X 2 DAYS, 2 X 2 DAYS, 1 X 2 DAYS 01/28/23   Young, Joni Fears D, MD  sacubitril-valsartan (ENTRESTO) 24-26 MG Take 1 tablet by mouth 2 (two) times daily. Patient not taking: Reported on 02/09/2023 06/12/22   Cathren Harsh, MD     Critical care time: 35 min  CRITICAL CARE Performed by: Darcella Gasman Lord Lancour   Total critical care time: 35 minutes  Critical care time was exclusive of separately billable procedures and treating other patients.  Critical care was necessary to treat or prevent imminent or life-threatening deterioration.  Critical care was time spent personally by me on the following activities: development of treatment plan with patient and/or surrogate as well as nursing, discussions with consultants, evaluation of patient's response to treatment, examination of patient, obtaining history from patient or surrogate, ordering and performing treatments and interventions, ordering and review of laboratory studies, ordering and review of radiographic studies, pulse oximetry and re-evaluation of patient's condition.   Darcella Gasman Laurajean Hosek, PA-C [facility] See Amion for pager If no response to pager , please call [phone] until  7pm After 7:00 pm call Elink  [phone]

## 2023-03-01 NOTE — TOC CM/SW Note (Signed)
Transition of Care [facility]) - Inpatient Brief Assessment   Patient Details  Name: April Ellis MRN: [id number] Date of Birth: April 11, 1976  Transition of Care [facility]) CM/SW Contact:    Gala Lewandowsky, RN Phone Number: 03/01/2023, 2:41 PM   Clinical Narrative: Patient presented post PEA arrest, acute on chronic respiratory failure-remains intubated. Case Manager will continue to follow for transition of care needs.    Transition of Care Asessment: Insurance and Status: Insurance coverage has been reviewed Patient has primary care physician: Yes     Prior/Current Home Services: No current home services   Readmission risk has been reviewed: Yes Transition of care needs: no transition of care needs at this time

## 2023-03-01 NOTE — Care Management (Signed)
Performed maintenance.  No visible skin breakdown

## 2023-03-01 NOTE — Plan of Care (Signed)
I spoke with patient's son [name] and updated him with patient's current condition, I relayed my concern that patient has sustained a significant hypoxic brain injury given her persistent myoclonic seizure activity and may not survive this admission with any significant neurologic recovery.  Omari voiced understanding and his desire that we keep doing everything we can at this time.  He is planning on coming to the hospital with his Aunt Thursday afternoon for further GOC discussions.   Darcella Gasman Eydan Chianese, PA-C

## 2023-03-01 NOTE — Procedures (Signed)
Patient Name: April Ellis  MRN: [id number]  Epilepsy Attending: Charlsie Quest  Referring Physician/Provider: Erick Blinks, MD  Duration: 02/28/2023 1352 to 03/01/2023 1352   Patient history: 47yo F with seizure like activity after cardiac arrest. EEG to evaluate for seizure.   Level of alertness:  comatose   AEDs during EEG study: Propofol, versed, LEV   Technical aspects: This EEG study was done with scalp electrodes positioned according to the 10-20 International system of electrode placement. Electrical activity was reviewed with band pass filter of 1-70Hz , sensitivity of 7 uV/mm, display speed of 27mm/sec with a 60Hz  notched filter applied as appropriate. EEG data were recorded continuously and digitally stored.  Video monitoring was available and reviewed as appropriate.   Description: The, patient was noted to have episodes of brief sudden whole body jerking every few seconds.  Concomitant EEG showed generalized spikes consistent with myoclonic seizures.  In between seizures, EEG showed continuous generalized 2 to 2.5 Hz delta slowing. After around 1500 on 02/28/2023, clinical seizures abated.  EEG then showed continuous generalized 3 to 6 Hz theta-delta slowing.  Gradually around 1800, EEG evolved into burst suppression pattern with bursts of 5 to 7 Hz theta slowing admixed with 15 to 18 Hz beta activity lasting 2 to 3 seconds alternating with 5 to 7 seconds of generalized suppression.  Hyperventilation and photic stimulation were not performed.      ABNORMALITY - Myoclonic seizure, generalized - Continuous slow, generalized - Burst suppression, generalized   IMPRESSION: At the beginning of the study, patient was noted to have myoclonic seizures every few seconds. Gradually after around 1500 on 02/28/2023, clinical seizures related.  Subsequently, EEG was suggestive of severe to profound diffuse encephalopathy, likely related to sedation.   April Ellis

## 2023-03-02 DIAGNOSIS — J4551 Severe persistent asthma with (acute) exacerbation: Secondary | ICD-10-CM | POA: Diagnosis not present

## 2023-03-02 DIAGNOSIS — R569 Unspecified convulsions: Secondary | ICD-10-CM | POA: Diagnosis not present

## 2023-03-02 DIAGNOSIS — I469 Cardiac arrest, cause unspecified: Secondary | ICD-10-CM | POA: Diagnosis not present

## 2023-03-02 DIAGNOSIS — J4489 Other specified chronic obstructive pulmonary disease: Secondary | ICD-10-CM

## 2023-03-02 DIAGNOSIS — G931 Anoxic brain damage, not elsewhere classified: Secondary | ICD-10-CM | POA: Diagnosis not present

## 2023-03-02 DIAGNOSIS — Z9911 Dependence on respirator [ventilator] status: Secondary | ICD-10-CM | POA: Diagnosis not present

## 2023-03-02 DIAGNOSIS — G9340 Encephalopathy, unspecified: Secondary | ICD-10-CM

## 2023-03-02 LAB — GLUCOSE, CAPILLARY
Glucose-Capillary: 133 mg/dL — ABNORMAL HIGH (ref 70–99)
Glucose-Capillary: 127 mg/dL — ABNORMAL HIGH (ref 70–99)
Glucose-Capillary: 126 mg/dL — ABNORMAL HIGH (ref 70–99)
Glucose-Capillary: 135 mg/dL — ABNORMAL HIGH (ref 70–99)
Glucose-Capillary: 152 mg/dL — ABNORMAL HIGH (ref 70–99)
Glucose-Capillary: 137 mg/dL — ABNORMAL HIGH (ref 70–99)
Glucose-Capillary: 73 mg/dL (ref 70–99)
Glucose-Capillary: 91 mg/dL (ref 70–99)

## 2023-03-02 LAB — BASIC METABOLIC PANEL
Anion gap: 10 (ref 5–15)
BUN: 21 mg/dL — ABNORMAL HIGH (ref 6–20)
CO2: 27 mmol/L (ref 22–32)
Calcium: 8.1 mg/dL — ABNORMAL LOW (ref 8.9–10.3)
Chloride: 103 mmol/L (ref 98–111)
Creatinine, Ser: 0.78 mg/dL (ref 0.44–1.00)
GFR, Estimated: >60 mL/min (ref >60)
Glucose, Bld: 133 mg/dL — ABNORMAL HIGH (ref 70–99)
Potassium: 4.5 mmol/L (ref 3.5–5.1)
Sodium: 140 mmol/L (ref 135–145)

## 2023-03-02 LAB — CBC
HCT: 44.1 % (ref 36.0–46.0)
Hemoglobin: 13.8 g/dL (ref 12.0–15.0)
MCH: 30.3 pg (ref 26.0–34.0)
MCHC: 31.3 g/dL (ref 30.0–36.0)
MCV: 96.7 fL (ref 80.0–100.0)
Platelets: 255 10*3/uL (ref 150–400)
RBC: 4.56 MIL/uL (ref 3.87–5.11)
RDW: 13.5 % (ref 11.5–15.5)
WBC: 16.4 10*3/uL — ABNORMAL HIGH (ref 4.0–10.5)
nRBC: 0 % (ref 0.0–0.2)

## 2023-03-02 LAB — BRAIN NATRIURETIC PEPTIDE: B Natriuretic Peptide: 242.2 pg/mL — ABNORMAL HIGH (ref 0.0–100.0)

## 2023-03-02 LAB — MAGNESIUM: Magnesium: 2.2 mg/dL (ref 1.7–2.4)

## 2023-03-02 LAB — PHOSPHORUS: Phosphorus: 4 mg/dL (ref 2.5–4.6)

## 2023-03-02 MED ORDER — METHYLPREDNISOLONE SODIUM SUCC 40 MG IJ SOLR
40.0000 mg | INTRAMUSCULAR | Status: AC
  Administered 2023-03-03 – 2023-03-08 (×6): 40 mg via INTRAVENOUS
  Filled 2023-03-02 (×6): qty 1

## 2023-03-02 NOTE — Progress Notes (Signed)
NAME:  April Ellis, MRN:  [id number], DOB:  01-02-1976, LOS: 3 ADMISSION DATE:  02/27/2023, CONSULTATION DATE: 6/2 REFERRING MD: Doran Durand, CHIEF COMPLAINT: Cardiac arrest  History of Present Illness:  This is a 47 year old female patient With known history of super obesity, asthma/COPD overlap, complex apnea both central and obstructive followed by Dr. Maple Hudson.  She was actually seen in our office on 5/3 at which time she was counseled to stop smoking, and was given a prednisone taper for possible asthmatic exacerbation. Patient was brought in by EMS on 6/2 after being found unresponsive outside of hairdressers.  Per EMS report she was initially apneic and pulseless she underwent 5 to 7 minutes of ACLS prior to return of spontaneous circulation she was intubated on the scene, after the fact EMS was unsure about whether or not she was truly pulseless.  Apparently following intubation she was agitated and combative requiring Versed.  Initial arterial blood gas pH 7.26, pCO2 70 pO2 534 HCO3 31.  White blood cell count 13.6, lactate 2.6.  Respiratory virus panel negative.  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 Might start weaning versed   Interim History / Subjective:  NAEO   Double stacking on vent this morning   Objective   Blood pressure (!) 131/53, pulse (!) 55, temperature 98.6 F (37 C), temperature  source Axillary, resp. rate 20, height 5\' 1"  (1.549 m), weight (!) 171.8 kg, SpO2 96 %.    Vent Mode: PRVC FiO2 (%):  [30 %] 30 % Set Rate:  [20 bmp-28 bmp] 20 bmp Vt Set:  [380 mL] 380 mL PEEP:  [8 cmH20-10 cmH20] 8 cmH20 Plateau Pressure:  [19 cmH20-22 cmH20] 19 cmH20   Intake/Output Summary (Last 24 hours) at 03/02/2023 0704 Last data filed at 03/02/2023 0600 Gross per 24 hour  Intake 3579.69 ml  Output 4300 ml  Net -720.31 ml   Filed Weights   02/27/23 2015 02/28/23 0600 03/01/23 0404  Weight: (!) 169.2 kg (!) 169.2 kg (!) 171.8 kg   General:  critically ill morbidly obese middle aged F intubated sedated NAD  HEENT: ETT secure EEG in place pink mmm  Neuro: Sedated, + cough, symmetrical pupils, no response to pain  CV: rr cap refill < 3 sec  PULM:  Wheezes. Symmetrical chest expansion. 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  -CTH with possible possible hypodensity in the L cerebellum P -cEEG -Keppra, prop, versed -- if no sz on cEEG might start weaning  -MRI at 72 hrs -appreciate neuro following -ongoing GOC   Acute on chronic hypoxic and hypercarbic resp failure AECOPD  OSA/OHS  P -cont MV support, VAP pulm hygiene -short course of CAP coverage  -decr trigger to -3  -cont solumedrol - -fine to keep as  is for now and make wean  more concrete plan pending MRI / GOC   Decompensated diastolic HF P -hold entresto   Hypotension - sedation related P -sedation for sz goal -SBP > 90 MAP > 65 -- NE to facilitate   Hyperglycemia -SSI  Leukocytosis -? reactive  -will complete short course of rocephin empirically   GOC -updated son and two sisters at bedside 6/5 encouraged to consider QOL goals, code status as  we approach period of neuroprognostication  -plan for MRI at 72 hr and cont GOC accordingly   Best Practice (right click and "Reselect all SmartList Selections" daily)   Diet/type: tubefeeds DVT prophylaxis: prophylactic heparin  GI prophylaxis: PPI Lines: N/A Foley:  Yes, and it is still needed Code Status:  full code Last date of multidisciplinary goals of care discussion [6/4]  Labs   CBC: Recent Labs  Lab 02/27/23 1618 02/27/23 1628 02/27/23 2018 02/27/23 2020 02/28/23 0415 03/01/23 0350 03/01/23 1551 03/02/23 0342  WBC 13.6*  --  16.8*  --  12.5* 14.9*  --  16.4*  NEUTROABS 8.7*  --   --   --   --   --   --   --   HGB 14.0   < > 14.6 15.6* 13.7  15.0 13.5 15.3* 13.8  HCT 45.6   < > 46.2* 46.0 44.8  44.0 42.2 45.0 44.1  MCV 99.3  --  96.0  --  95.9 95.3  --  96.7  PLT 277  --  245  --  238 273  --  255   < > = values in this interval not displayed.    Basic Metabolic Panel: Recent Labs  Lab 02/27/23 1618 02/27/23 1628 02/27/23 2018 02/27/23 2020 02/28/23 0415 03/01/23 0350 03/01/23 1551 03/02/23 0342  NA 137   < >  --  138 135  136 140 142 140  K 5.1   < >  --  4.5 4.7  5.1 4.0 4.2 4.5  CL 101  --   --   --  101 105  --  103  CO2 23  --   --   --  26 28  --  27  GLUCOSE 185*  --   --   --  130* 153*  --  133*  BUN 13  --   --   --  10 13  --  21*  CREATININE 0.98  --  0.84  --  0.67 0.79  --  0.78  CALCIUM 7.8*  --   --   --  7.9* 8.1*  --  8.1*  MG  --   --   --   --  2.4 2.3  --  2.2  PHOS  --   --   --   --   --  3.9  --  4.0   < > = values in this interval not displayed.   GFR: Estimated Creatinine Clearance: 136.5 mL/min (by C-G formula based on SCr of 0.78 mg/dL). Recent Labs  Lab 02/27/23 1615 02/27/23 1618 02/27/23 1800 02/27/23 2018 02/28/23 0415 03/01/23 0350 03/02/23 0342  PROCALCITON  --   --   --  <0.10  --   --   --   WBC  --    < >  --  16.8* 12.5* 14.9* 16.4*  LATICACIDVEN 2.6*  --  1.3  --   --   --   --    < > = values  in this  interval not displayed.    Liver Function Tests: Recent Labs  Lab 02/27/23 1618 03/01/23 0350  AST 129* 39  ALT 134* 87*  ALKPHOS 81 59  BILITOT 1.3* 0.6  PROT 6.0* 6.0*  ALBUMIN 3.1* 2.7*   No results for input(s): "LIPASE", "AMYLASE" in the last 168 hours. No results for input(s): "AMMONIA" in the last 168 hours.  ABG    Component Value Date/Time   PHART 7.390 03/01/2023 1551   PCO2ART 51.4 (H) 03/01/2023 1551   PO2ART 82 (L) 03/01/2023 1551   HCO3 31.2 (H) 03/01/2023 1551   TCO2 33 (H) 03/01/2023 1551   ACIDBASEDEF 2.0 02/27/2023 1628   O2SAT 96 03/01/2023 1551     Coagulation Profile: No results for input(s): "INR", "PROTIME" in the last 168 hours.  Cardiac Enzymes: No results for input(s): "CKTOTAL", "CKMB", "CKMBINDEX", "TROPONINI" in the last 168 hours.  HbA1C: Hgb A1c MFr Bld  Date/Time Value Ref Range Status  02/27/2023 08:18 PM 4.8 4.8 - 5.6 % Final    Comment:    (NOTE)         Prediabetes: 5.7 - 6.4         Diabetes: >6.4         Glycemic control for adults with diabetes: <7.0   04/21/2022 04:17 PM 5.0 4.8 - 5.6 % Final    Comment:             Prediabetes: 5.7 - 6.4          Diabetes: >6.4          Glycemic control for adults with diabetes: <7.0     CBG: Recent Labs  Lab 03/01/23 1115 03/01/23 1626 03/01/23 2044 03/01/23 2333 03/02/23 0348  GLUCAP 151* 126* 133* 101* 127*    CRITICAL CARE Performed by: Lanier Clam   Total critical care time: 38 minutes  Critical care time was exclusive of separately billable procedures and treating other patients. Critical care was necessary to treat or prevent imminent or life-threatening deterioration.  Critical care was time spent personally by me on the following activities: development of treatment plan with patient and/or surrogate as well as nursing, discussions with consultants, evaluation of patient's response to treatment, examination of patient, obtaining history from patient  or surrogate, ordering and performing treatments and interventions, ordering and review of laboratory studies, ordering and review of radiographic studies, pulse oximetry and re-evaluation of patient's condition.  Tessie Fass MSN, AGACNP-BC [facility] Amion for pager  03/02/2023, 7:05 AM

## 2023-03-02 NOTE — Progress Notes (Signed)
Sedation weaned off. Clinical condition unchanged. No cough with suctioning at this time, still no gag reflex. VSS, weaning norepi.

## 2023-03-02 NOTE — Progress Notes (Signed)
LTM maint complete - no skin breakdown under: F3, F4, F8

## 2023-03-02 NOTE — Progress Notes (Signed)
NEUROLOGY CONSULTATION PROGRESS NOTE   Date of service: March 02, 2023 Patient Name: April Ellis MRN:  [id number] DOB:  [birth year]  Brief HPI  April Ellis is a 47 y.o. 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 She was apneic and in presumed PEA arrest as EMS were unable to feel a pulse but were unsure. She underwent 5-7 mins of CPR with ROSC. She was intubated in the field.   She was admitted to the ICU where she was noted to have myoclonus when sedation was lightened. 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  Put in burst suppression on LTM and then weaned off propofol.   Interval Hx   Down to propofol at 10, plan is to stop propofol and wean versed today.  Vitals   Vitals:   03/02/23 1100 03/02/23 1119 03/02/23 1200 03/02/23 1300  BP: (!) 124/37  124/64 (!) 109/55  Pulse: 62 62 68 65  Resp: 20 18 11 12   Temp: 99.1 F (37.3 C) 99.1 F (37.3 C) 99.1 F (37.3 C) 99.3 F (37.4 C)  TempSrc:      SpO2: 93% 95% 94% 94%  Weight:      Height:         Body mass index is 71.56 kg/m.  Physical Exam  General: Laying comfortably in bed; intubated, obese. HENT: Normal oropharynx and mucosa. Normal external appearance of ears and nose.  Neck: Supple, no pain or tenderness  CV: No JVD. No peripheral edema.  Pulmonary: Symmetric Chest rise. Breathing over vent. Abdomen: Soft to touch, non-tender.  Ext: No cyanosis, edema, or deformity  Skin: No rash. Normal palpation of skin.   Musculoskeletal: Normal digits and nails by inspection. No clubbing.    Neurologic Examination  Mental status/Cognition: No response to voice or loud clap or to tactile or noxious stimuli.  No clinical myoclonus. Speech/language: Mute, no speech, no attempts to communicate.   Cranial nerves:   CN II Pupils equal and reactive to light, unable to assess for visual field deficits.   CN III,IV,VI EOM intact to  doll's eye.   CN V Corneals intact bilaterally   CN VII No facial grimace noted to noxious stimuli.   CN VIII Does not turn head towards speech.   CN IX & X +cough, no gag.   CN XI Head is midline.   CN XII Tongue midline despite ETT.    Motor/sensory:  Muscle bulk: Normal, tone flaccid in all extremities. No response to proximal pinch in any of the extremities.    Coordination/Complex Motor:  -Unable to assess.  Labs   Basic Metabolic Panel:  Lab Results  Component Value Date   NA 140 03/02/2023   K 4.5 03/02/2023   CO2 27 03/02/2023   GLUCOSE 133 (H) 03/02/2023   BUN 21 (H) 03/02/2023   CREATININE 0.78 03/02/2023   CALCIUM 8.1 (L) 03/02/2023   GFRNONAA >60 03/02/2023   GFRAA 108 04/14/2020   HbA1c:  Lab Results  Component Value Date   HGBA1C 4.8 02/27/2023   LDL:  Lab Results  Component Value Date   LDLCALC 41 07/19/2022   Urine Drug Screen:     Component Value Date/Time   LABOPIA NONE DETECTED 11/13/2022 0441   COCAINSCRNUR NONE DETECTED 11/13/2022 0441   LABBENZ NONE DETECTED 11/13/2022 0441   AMPHETMU NONE DETECTED 11/13/2022 0441   THCU NONE DETECTED 11/13/2022 0441   LABBARB NONE  DETECTED 11/13/2022 0441    Alcohol Level     Component Value Date/Time   ETH <10 11/12/2022 2224   No results found for: "PHENYTOIN", "ZONISAMIDE", "LAMOTRIGINE", "LEVETIRACETA" No results found for: "PHENYTOIN", "PHENOBARB", "VALPROATE", "CBMZ"  Imaging and Diagnostic studies   CT head w/o contrast: 1. Possible hypodensity in the left cerebellum, although this area is quite prone to artifact. An MRI could provide further evaluation. 2. No other findings suggestive of acute infarct. 3. No acute intracranial hemorrhage.  Impression   April Ellis is a 47 y.o. 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 She was apneic and in presumed PEA arrest as EMS were unable to feel a pulse but were unsure. She underwent 5-7  mins of CPR with ROSC. She was intubated in the field.   She was admitted to the ICU where she was noted to have myoclonus when sedation was lightened. 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  Myoclonic seizures resolved around 1500 on 02/28/23 with propofol 80, versed 10, Keppra 1500 BID and patient in burst suppression. Propofol weaned off on 6/4.  Recommendations  - Continue Keppra 1500 BID - plan to wean versed by 1mg /hr today until off. - LTM EEG overnight. - MRI Brain without contrast when feasible ______________________________________________________________________   Thank you for the opportunity to take part in the care of this patient. If you have any further questions, please contact the neurology consultation attending.  Signed,  Erick Blinks [facility]

## 2023-03-02 NOTE — Procedures (Signed)
Patient Name: BLAKE CUBILLAS  MRN: [id number]  Epilepsy Attending: Charlsie Quest  Referring Physician/Provider: Erick Blinks, MD  Duration: 03/01/2023 1352 to 03/02/2023 1352   Patient history: 47yo F with seizure like activity after cardiac arrest. EEG to evaluate for seizure.   Level of alertness:  comatose   AEDs during EEG study: Propofol, versed, LEV   Technical aspects: This EEG study was done with scalp electrodes positioned according to the 10-20 International system of electrode placement. Electrical activity was reviewed with band pass filter of 1-70Hz , sensitivity of 7 uV/mm, display speed of 28mm/sec with a 60Hz  notched filter applied as appropriate. EEG data were recorded continuously and digitally stored.  Video monitoring was available and reviewed as appropriate.   Description: EEG showed continuous generalized 3-5hz  theta-delta slowing. Generalized periodic spikes were noted at 2 Hz, rarely reaching 3hz .  Hyperventilation and photic stimulation were not performed.      ABNORMALITY - Periodic spikes, generalized - Continuous slow, generalized   IMPRESSION: This study is suggestive of epileptogenicity with generalized onset and increased risk of seizure recurrence.  Additionally there is severe diffuse encephalopathy.  In the setting of cardiac arrest, this EEG is concerning for anoxic/hypoxic brain injury.   Ketty Bitton Annabelle Harman

## 2023-03-02 NOTE — Progress Notes (Signed)
LTM maint complete - no skin breakdown under: Fp2 F4  Serviced C3 c4 [facility] monitored, Event button test confirmed by [facility].

## 2023-03-02 NOTE — Progress Notes (Signed)
RN called to see if pt has MRI safe leads. Walked through process of how to DC the pt for MRI and then reconnect. MRI scheduled for 6am 03/03/23

## 2023-03-03 ENCOUNTER — Inpatient Hospital Stay (HOSPITAL_COMMUNITY): Payer: BC Managed Care – PPO

## 2023-03-03 DIAGNOSIS — J9601 Acute respiratory failure with hypoxia: Secondary | ICD-10-CM | POA: Diagnosis not present

## 2023-03-03 DIAGNOSIS — G931 Anoxic brain damage, not elsewhere classified: Secondary | ICD-10-CM | POA: Diagnosis not present

## 2023-03-03 DIAGNOSIS — I469 Cardiac arrest, cause unspecified: Secondary | ICD-10-CM | POA: Diagnosis not present

## 2023-03-03 DIAGNOSIS — R569 Unspecified convulsions: Secondary | ICD-10-CM | POA: Diagnosis not present

## 2023-03-03 DIAGNOSIS — Z9911 Dependence on respirator [ventilator] status: Secondary | ICD-10-CM

## 2023-03-03 DIAGNOSIS — Z7189 Other specified counseling: Secondary | ICD-10-CM

## 2023-03-03 DIAGNOSIS — G40409 Other generalized epilepsy and epileptic syndromes, not intractable, without status epilepticus: Secondary | ICD-10-CM

## 2023-03-03 LAB — CBC
HCT: 45.2 % (ref 36.0–46.0)
Hemoglobin: 13.9 g/dL (ref 12.0–15.0)
MCH: 29.7 pg (ref 26.0–34.0)
MCHC: 30.8 g/dL (ref 30.0–36.0)
MCV: 96.6 fL (ref 80.0–100.0)
Platelets: 162 10*3/uL (ref 150–400)
RBC: 4.68 MIL/uL (ref 3.87–5.11)
RDW: 13.5 % (ref 11.5–15.5)
WBC: 13.2 10*3/uL — ABNORMAL HIGH (ref 4.0–10.5)
nRBC: 0 % (ref 0.0–0.2)

## 2023-03-03 LAB — BRAIN NATRIURETIC PEPTIDE: B Natriuretic Peptide: 43.2 pg/mL (ref 0.0–100.0)

## 2023-03-03 LAB — GLUCOSE, CAPILLARY
Glucose-Capillary: 57 mg/dL — ABNORMAL LOW (ref 70–99)
Glucose-Capillary: 68 mg/dL — ABNORMAL LOW (ref 70–99)
Glucose-Capillary: 78 mg/dL (ref 70–99)
Glucose-Capillary: 49 mg/dL — ABNORMAL LOW (ref 70–99)
Glucose-Capillary: 97 mg/dL (ref 70–99)
Glucose-Capillary: 118 mg/dL — ABNORMAL HIGH (ref 70–99)
Glucose-Capillary: 124 mg/dL — ABNORMAL HIGH (ref 70–99)
Glucose-Capillary: 108 mg/dL — ABNORMAL HIGH (ref 70–99)

## 2023-03-03 LAB — HEPATIC FUNCTION PANEL
ALT: 51 U/L — ABNORMAL HIGH (ref 0–44)
AST: 21 U/L (ref 15–41)
Albumin: 3.1 g/dL — ABNORMAL LOW (ref 3.5–5.0)
Alkaline Phosphatase: 62 U/L (ref 38–126)
Bilirubin, Direct: 0.4 mg/dL — ABNORMAL HIGH (ref 0.0–0.2)
Indirect Bilirubin: 0.9 mg/dL (ref 0.3–0.9)
Total Bilirubin: 1.3 mg/dL — ABNORMAL HIGH (ref 0.3–1.2)
Total Protein: 6.6 g/dL (ref 6.5–8.1)

## 2023-03-03 LAB — BASIC METABOLIC PANEL
Anion gap: 7 (ref 5–15)
BUN: 33 mg/dL — ABNORMAL HIGH (ref 6–20)
CO2: 30 mmol/L (ref 22–32)
Calcium: 8.5 mg/dL — ABNORMAL LOW (ref 8.9–10.3)
Chloride: 102 mmol/L (ref 98–111)
Creatinine, Ser: 0.68 mg/dL (ref 0.44–1.00)
GFR, Estimated: >60 mL/min (ref >60)
Glucose, Bld: 144 mg/dL — ABNORMAL HIGH (ref 70–99)
Potassium: 5.1 mmol/L (ref 3.5–5.1)
Sodium: 139 mmol/L (ref 135–145)

## 2023-03-03 LAB — TRIGLYCERIDES: Triglycerides: 62 mg/dL (ref <150)

## 2023-03-03 LAB — PHOSPHORUS: Phosphorus: 3.6 mg/dL (ref 2.5–4.6)

## 2023-03-03 LAB — MAGNESIUM: Magnesium: 2.4 mg/dL (ref 1.7–2.4)

## 2023-03-03 MED ORDER — DEXTROSE 50 % IV SOLN
25.0000 g | INTRAVENOUS | Status: AC
  Administered 2023-03-03: 25 g via INTRAVENOUS

## 2023-03-03 MED ORDER — FENTANYL CITRATE PF 50 MCG/ML IJ SOSY
25.0000 ug | PREFILLED_SYRINGE | INTRAMUSCULAR | Status: DC | PRN
  Filled 2023-03-03 (×2): qty 2

## 2023-03-03 MED ORDER — DEXTROSE 50 % IV SOLN
12.5000 g | INTRAVENOUS | Status: AC
  Administered 2023-03-03: 12.5 g via INTRAVENOUS

## 2023-03-03 MED ORDER — DEXTROSE 50 % IV SOLN
INTRAVENOUS | Status: AC
  Filled 2023-03-03: qty 50

## 2023-03-03 NOTE — Procedures (Addendum)
Patient Name: April Ellis  MRN: [id number]  Epilepsy Attending: Charlsie Quest  Referring Physician/Provider: Erick Blinks, MD  Duration: 03/02/2023 1352 to 03/03/2023 1126   Patient history: 46yo F with seizure like activity after cardiac arrest. EEG to evaluate for seizure.   Level of alertness:  comatose   AEDs during EEG study: LEV   Technical aspects: This EEG study was done with scalp electrodes positioned according to the 10-20 International system of electrode placement. Electrical activity was reviewed with band pass filter of 1-70Hz , sensitivity of 7 uV/mm, display speed of 65mm/sec with a 60Hz  notched filter applied as appropriate. EEG data were recorded continuously and digitally stored.  Video monitoring was available and reviewed as appropriate.   Description: EEG showed continuous generalized 3-5hz  theta-delta slowing. Generalized periodic spikes were noted at 2 Hz, rarely reaching 3hz .  Hyperventilation and photic stimulation were not performed.     EEG was disconnected between 03/03/2023 0620 to 0845 for MRI brain   ABNORMALITY - Periodic spikes, generalized - Continuous slow, generalized   IMPRESSION: This study is suggestive of epileptogenicity with generalized onset and increased risk of seizure recurrence.  Additionally there is severe diffuse encephalopathy.  In the setting of cardiac arrest, this EEG is concerning for anoxic/hypoxic brain injury.   [name] Annabelle Harman

## 2023-03-03 NOTE — Progress Notes (Signed)
LTM EEG discontinued - no skin breakdown at unhook.   

## 2023-03-03 NOTE — IPAL (Signed)
  Interdisciplinary Goals of Care Family Meeting   Date carried out: 03/03/2023  Location of the meeting: Bedside  Member's involved: Nurse Practitioner and Bedside Registered Nurse Sister [name], other family   Durable Power of Attorney or acting medical decision maker: Son [name] who was not present     Discussion: We discussed goals of care for April Ellis .  Discussed EEG, MRI results and neuro exam in context of clinical picture, concerning for anoxic injury  without evidence of higher cerebral function. Low likelihood of functional recovery -- possible veg state vs minimally conscious.  Talked about consideration of quality vs quantity of life. Talked about what care trajectories for each could entail. Talked about code status.   Encouraged them to talk with [name] tonight at home (who has his phone going directly to VM so I was unable to reach) and notify unit of a time when family can have a meeting tomorrow.    Code status:   Code Status: Full Code   Disposition: Continue current acute care  Time spent for the meeting: 27 min    Lanier Clam, NP  03/03/2023, 6:28 PM

## 2023-03-03 NOTE — Care Management Note (Signed)
Patient back from MRI.  Hooked back up to EEG, called [facility] to advise of re-hook.

## 2023-03-03 NOTE — Progress Notes (Signed)
NEUROLOGY CONSULTATION PROGRESS NOTE   Date of service: March 03, 2023 Patient Name: April Ellis MRN:  [id number] DOB:  [birth year]  Brief HPI  April Ellis is a 47 y.o. 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 She was apneic and in presumed PEA arrest as EMS were unable to feel a pulse but were unsure. She underwent 5-7 mins of CPR with ROSC. She was intubated in the field.   She was admitted to the ICU where she was noted to have myoclonus when sedation was lightened. 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  Put in burst suppression on LTM and then weaned off propofol and then versed with no seizure recurrence.   Interval Hx   MRI Brain obtained which seems normal.  Vitals   Vitals:   03/03/23 0500 03/03/23 0800 03/03/23 0807 03/03/23 1110  BP: 126/70 126/78  113/66  Pulse: 89   71  Resp: 14 16    Temp: 100.2 F (37.9 C)  99.2 F (37.3 C) 99.3 F (37.4 C)  TempSrc:   Axillary Oral  SpO2: 98% 100%  100%  Weight: (!) 171 kg     Height:         Body mass index is 71.23 kg/m.  Physical Exam  General: Laying comfortably in bed; intubated, obese. HENT: Normal oropharynx and mucosa. Normal external appearance of ears and nose.  Neck: Supple, no pain or tenderness  CV: No JVD. No peripheral edema.  Pulmonary: Symmetric Chest rise. Breathing over vent. Abdomen: Soft to touch, non-tender.  Ext: No cyanosis, edema, or deformity  Skin: No rash. Normal palpation of skin.   Musculoskeletal: Normal digits and nails by inspection. No clubbing.    Neurologic Examination  Mental status/Cognition: No response to voice, BL eyelid flickering with loud clap or vigorous tactile or noxious stimuli.  No clinical myoclonus. Speech/language: Mute, no speech, no attempts to communicate.   Cranial nerves:   CN II Pupils equal and reactive to light, unable to assess for visual field deficits.    CN III,IV,VI EOM intact to doll's eye.   CN V Corneals intact bilaterally   CN VII No facial grimace noted to noxious stimuli.   CN VIII Does not turn head towards speech.   CN IX & X +cough, no gag.   CN XI Head is midline.   CN XII Tongue midline despite ETT.    Motor/sensory:  Muscle bulk: Normal, tone flaccid in all extremities. No response to proximal pinch in BL upper extremities.  Upgoing babinskis noted intermittently to pinch in BL lower extremities.   Coordination/Complex Motor:  -Unable to assess.  Labs   Basic Metabolic Panel:  Lab Results  Component Value Date   NA 139 03/03/2023   K 5.1 03/03/2023   CO2 30 03/03/2023   GLUCOSE 144 (H) 03/03/2023   BUN 33 (H) 03/03/2023   CREATININE 0.68 03/03/2023   CALCIUM 8.5 (L) 03/03/2023   GFRNONAA >60 03/03/2023   GFRAA 108 04/14/2020   HbA1c:  Lab Results  Component Value Date   HGBA1C 4.8 02/27/2023   LDL:  Lab Results  Component Value Date   LDLCALC 41 07/19/2022   Urine Drug Screen:     Component Value Date/Time   LABOPIA NONE DETECTED 11/13/2022 0441   COCAINSCRNUR NONE DETECTED 11/13/2022 0441   LABBENZ NONE DETECTED 11/13/2022 0441   AMPHETMU NONE DETECTED 11/13/2022 0441   THCU  NONE DETECTED 11/13/2022 0441   LABBARB NONE DETECTED 11/13/2022 0441    Alcohol Level     Component Value Date/Time   ETH <10 11/12/2022 2224   No results found for: "PHENYTOIN", "ZONISAMIDE", "LAMOTRIGINE", "LEVETIRACETA" No results found for: "PHENYTOIN", "PHENOBARB", "VALPROATE", "CBMZ"  Imaging and Diagnostic studies   CT head w/o contrast: 1. Possible hypodensity in the left cerebellum, although this area is quite prone to artifact. An MRI could provide further evaluation. 2. No other findings suggestive of acute infarct. 3. No acute intracranial hemorrhage.  LTM EEG: 03/03/23 This study is suggestive of epileptogenicity with generalized onset and increased risk of seizure recurrence. Additionally there is  severe diffuse encephalopathy. In the setting of cardiac arrest, this EEG is concerning for anoxic/hypoxic brain injury.   Impression   April Ellis is a 47 y.o. 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 She was apneic and in presumed PEA arrest as EMS were unable to feel a pulse but were unsure. She underwent 5-7 mins of CPR with ROSC. She was intubated in the field.   She was admitted to the ICU where she was noted to have myoclonus when sedation was lightened. 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  Myoclonic seizures resolved around 1500 on 02/28/23 with propofol 80, versed 10, Keppra 1500 BID and patient in burst suppression. Propofol weaned off on 6/4. Versed weaned off 6/5.  Despite weaning off sedation, no evidence of higher cerebral function, LTM EEG with severe diffuse encephalopathy and generalized periodic sharp waves. MRI with perhaps at most mild DWI changes and may benefit from repeating in a few days.  Overall, high suspicion for anoxic brain injury. Likelihood of functional recovery is low. 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 Will likely require feeding tube and trach.  Recommendations  - Continue Keppra 1500 BID - dc LTM - poor prognosis and neurological recovery. ______________________________________________________________________   Thank you for the opportunity to take part in the care of this patient. If you have any further questions, please contact the neurology consultation attending.  Signed,  Erick Blinks [facility]

## 2023-03-03 NOTE — Progress Notes (Addendum)
NAME:  April Ellis, MRN:  [id number], DOB:  [birth year], LOS: 4 ADMISSION DATE:  02/27/2023, CONSULTATION DATE: 6/2 REFERRING MD: Doran Durand, CHIEF COMPLAINT: Cardiac arrest  History of Present Illness:  This is a 47 year old female patient With known history of super obesity, asthma/COPD overlap, complex apnea both central and obstructive followed by Dr. Maple Hudson.  She was actually seen in our office on 5/3 at which time she was counseled to stop smoking, and was given a prednisone taper for possible asthmatic exacerbation. Patient was brought in by EMS on 6/2 after being found unresponsive outside of [employer]ers.  Per EMS report she was initially apneic and pulseless she underwent 5 to 7 minutes of ACLS prior to return of spontaneous circulation she was intubated on the scene, after the fact EMS was unsure about whether or not she was truly pulseless.  Apparently following intubation she was agitated and combative requiring Versed.  Initial arterial blood gas pH 7.26, pCO2 70 pO2 534 HCO3 31.  White blood cell count 13.6, lactate 2.6.  Respiratory virus panel negative.  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 Might start weaning versed  6/6 MRI brain. Off cont sedation   Interim History / Subjective:  Went for MRI brain this morning  PRN fent added overnight but has been off cont sedation since yesterday  afternoon   Labs are still pending  POC CBGs down trending   Objective   Blood pressure 126/78, pulse 89, temperature 99.2 F (37.3 C), temperature source Axillary, resp. rate 16, height 5\' 1"  (1.549 m), weight (!) 171 kg, SpO2 100 %.    Vent Mode: PRVC FiO2 (%):  [30 %] 30 % Set Rate:  [20 bmp-22 bmp] 20 bmp Vt Set:  [380 mL] 380 mL PEEP:  [8 cmH20] 8 cmH20 Plateau Pressure:  [15 cmH20-22 cmH20] 15 cmH20   Intake/Output Summary (Last 24 hours) at 03/03/2023 0948 Last data filed at 03/03/2023 0800 Gross per 24 hour  Intake 2145.02 ml  Output 1850 ml  Net 295.02 ml   Filed Weights   02/28/23 0600 03/01/23 0404 03/03/23 0500  Weight: (!) 169.2 kg (!) 171.8 kg (!) 171 kg   General:  critically ill middle aged morbidly obese F intubated  HEENT: ETT secure  Neuro: + cough. Pupils are 4mm. 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  -CTH with possible possible hypodensity in the L cerebellum P -cEEG -keppra -f/u MRI read  -off sedation  -appreciate neuro following -ongoing GOC   AoC hypoxic and hypercarbic resp failure AECOPD OSA/OHS  P -cont MV support, VAP pulm hygiene -short course of CAP coverage  -started steroid wean 6/5   Decompensated diastolic HF  P -hold entresto   Hypotension - improved  P -sedation for sz goal -SBP > 90 MAP > 65 -- NE to facilitate   Leukocytosis -? reactive  -will complete short course of rocephin empirically -labs are pending 6/6    Hypoglycemia -dc SSI -replace PRN  -add on LFTs to pending labs   GOC -updated son and two sisters at bedside 6/5 encouraged to consider QOL goals, code status as we approach  period of neuroprognostication  -GOC pending MRI read   Best Practice (right click and "Reselect all SmartList Selections" daily)   Diet/type: tubefeeds DVT prophylaxis: prophylactic heparin  GI prophylaxis: PPI Lines: N/A Foley:  Yes, and it is still needed Code Status:  full code Last date of multidisciplinary goals of care discussion [6/4]  Labs   CBC: Recent Labs  Lab 02/27/23 1618 02/27/23 1628 02/27/23 2018 02/27/23 2020 02/28/23 0415 03/01/23 0350 03/01/23 1551 03/02/23 0342 03/03/23 0912  WBC 13.6*  --  16.8*  --  12.5* 14.9*  --  16.4* 13.2*  NEUTROABS 8.7*  --   --   --   --   --   --   --   --   HGB 14.0   < > 14.6   < > 13.7  15.0 13.5 15.3* 13.8 13.9  HCT 45.6   < > 46.2*   < > 44.8  44.0 42.2 45.0 44.1 45.2  MCV 99.3  --  96.0  --  95.9 95.3  --  96.7 96.6  PLT 277  --  245  --  238 273  --  255 162   < > = values in this interval not displayed.    Basic Metabolic Panel: Recent Labs  Lab 02/27/23 1618 02/27/23 1628 02/27/23 2018 02/27/23 2020 02/28/23 0415 03/01/23 0350 03/01/23 1551 03/02/23 0342  NA 137   < >  --  138 135  136 140 142 140  K 5.1   < >  --  4.5 4.7  5.1 4.0 4.2 4.5  CL 101  --   --   --  101 105  --  103  CO2 23  --   --   --  26 28  --  27  GLUCOSE 185*  --   --   --  130* 153*  --  133*  BUN 13  --   --   --  10 13  --  21*  CREATININE 0.98  --  0.84  --  0.67 0.79  --  0.78  CALCIUM 7.8*  --   --   --  7.9* 8.1*  --  8.1*  MG  --   --   --   --  2.4 2.3  --  2.2  PHOS  --   --   --   --   --  3.9  --  4.0   < > = values in this interval not displayed.   GFR: Estimated Creatinine Clearance: 136.1 mL/min (by C-G formula based on SCr of 0.78 mg/dL). Recent Labs  Lab 02/27/23 1615 02/27/23 1618 02/27/23 1800 02/27/23 2018 02/28/23 0415 03/01/23 0350 03/02/23 0342 03/03/23 0912  PROCALCITON  --   --   --  <0.10  --   --   --   --   WBC  --    < >  --  16.8* 12.5* 14.9* 16.4* 13.2*  LATICACIDVEN 2.6*  --  1.3  --    --   --   --   --    < > =  values in this interval not displayed.    Liver Function Tests: Recent Labs  Lab 02/27/23 1618 03/01/23 0350  AST 129* 39  ALT 134* 87*  ALKPHOS 81 59  BILITOT 1.3* 0.6  PROT 6.0* 6.0*  ALBUMIN 3.1* 2.7*   No results for input(s): "LIPASE", "AMYLASE" in the last 168 hours. No results for input(s): "AMMONIA" in the last 168 hours.  ABG    Component Value Date/Time   PHART 7.390 03/01/2023 1551   PCO2ART 51.4 (H) 03/01/2023 1551   PO2ART 82 (L) 03/01/2023 1551   HCO3 31.2 (H) 03/01/2023 1551   TCO2 33 (H) 03/01/2023 1551   ACIDBASEDEF 2.0 02/27/2023 1628   O2SAT 96 03/01/2023 1551     Coagulation Profile: No results for input(s): "INR", "PROTIME" in the last 168 hours.  Cardiac Enzymes: No results for input(s): "CKTOTAL", "CKMB", "CKMBINDEX", "TROPONINI" in the last 168 hours.  HbA1C: Hgb A1c MFr Bld  Date/Time Value Ref Range Status  02/27/2023 08:18 PM 4.8 4.8 - 5.6 % Final    Comment:    (NOTE)         Prediabetes: 5.7 - 6.4         Diabetes: >6.4         Glycemic control for adults with diabetes: <7.0   04/21/2022 04:17 PM 5.0 4.8 - 5.6 % Final    Comment:             Prediabetes: 5.7 - 6.4          Diabetes: >6.4          Glycemic control for adults with diabetes: <7.0     CBG: Recent Labs  Lab 03/03/23 0436 03/03/23 0458 03/03/23 0521 03/03/23 0804 03/03/23 0836  GLUCAP 57* 68* 78 49* 97    CRITICAL CARE Performed by: Lanier Clam   Total critical care time: 37 minutes  Critical care time was exclusive of separately billable procedures and treating other patients. Critical care was necessary to treat or prevent imminent or life-threatening deterioration.  Critical care was time spent personally by me on the following activities: development of treatment plan with patient and/or surrogate as well as nursing, discussions with consultants, evaluation of patient's response to treatment, examination of patient,  obtaining history from patient or surrogate, ordering and performing treatments and interventions, ordering and review of laboratory studies, ordering and review of radiographic studies, pulse oximetry and re-evaluation of patient's condition.  Tessie Fass MSN, AGACNP-BC [facility] Pulmonary/Critical Care Medicine Amion for pager  03/03/2023, 9:48 AM

## 2023-03-03 NOTE — Progress Notes (Signed)
eLink Physician-Brief Progress Note Patient Name: April Ellis DOB: 1976/04/10 MRN: [id number]   Date of Service  03/03/2023  HPI/Events of Note  Patient needs order for stat KUB prior to MRI. Patient needs PRN order for Fentanyl for trip to MRI.  eICU Interventions  Orders entered.        Thomasene Lot Jhalil Silvera 03/03/2023, 4:41 AM

## 2023-03-04 ENCOUNTER — Inpatient Hospital Stay (HOSPITAL_COMMUNITY): Payer: BC Managed Care – PPO

## 2023-03-04 DIAGNOSIS — I469 Cardiac arrest, cause unspecified: Secondary | ICD-10-CM | POA: Diagnosis not present

## 2023-03-04 LAB — BASIC METABOLIC PANEL
Anion gap: 5 (ref 5–15)
BUN: 30 mg/dL — ABNORMAL HIGH (ref 6–20)
CO2: 33 mmol/L — ABNORMAL HIGH (ref 22–32)
Calcium: 8.3 mg/dL — ABNORMAL LOW (ref 8.9–10.3)
Chloride: 99 mmol/L (ref 98–111)
Creatinine, Ser: 0.65 mg/dL (ref 0.44–1.00)
GFR, Estimated: >60 mL/min (ref >60)
Glucose, Bld: 89 mg/dL (ref 70–99)
Potassium: 5.1 mmol/L (ref 3.5–5.1)
Sodium: 137 mmol/L (ref 135–145)

## 2023-03-04 LAB — GLUCOSE, CAPILLARY
Glucose-Capillary: 106 mg/dL — ABNORMAL HIGH (ref 70–99)
Glucose-Capillary: 119 mg/dL — ABNORMAL HIGH (ref 70–99)
Glucose-Capillary: 141 mg/dL — ABNORMAL HIGH (ref 70–99)
Glucose-Capillary: 145 mg/dL — ABNORMAL HIGH (ref 70–99)
Glucose-Capillary: 159 mg/dL — ABNORMAL HIGH (ref 70–99)
Glucose-Capillary: 185 mg/dL — ABNORMAL HIGH (ref 70–99)

## 2023-03-04 LAB — MAGNESIUM: Magnesium: 2.3 mg/dL (ref 1.7–2.4)

## 2023-03-04 LAB — PHOSPHORUS: Phosphorus: 3.3 mg/dL (ref 2.5–4.6)

## 2023-03-04 LAB — BRAIN NATRIURETIC PEPTIDE: B Natriuretic Peptide: 67.7 pg/mL (ref 0.0–100.0)

## 2023-03-04 MED ORDER — MAGNESIUM SULFATE 2 GM/50ML IV SOLN
2.0000 g | Freq: Once | INTRAVENOUS | Status: AC
  Administered 2023-03-04: 2 g via INTRAVENOUS
  Filled 2023-03-04: qty 50

## 2023-03-04 MED ORDER — METHYLPREDNISOLONE SODIUM SUCC 40 MG IJ SOLR
40.0000 mg | Freq: Once | INTRAMUSCULAR | Status: AC
  Administered 2023-03-04: 40 mg via INTRAVENOUS
  Filled 2023-03-04: qty 1

## 2023-03-04 MED ORDER — IPRATROPIUM-ALBUTEROL 0.5-2.5 (3) MG/3ML IN SOLN
3.0000 mL | RESPIRATORY_TRACT | Status: DC
  Administered 2023-03-04 – 2023-03-14 (×60): 3 mL via RESPIRATORY_TRACT
  Filled 2023-03-04 (×59): qty 3

## 2023-03-04 MED ORDER — FUROSEMIDE 10 MG/ML IJ SOLN
40.0000 mg | Freq: Four times a day (QID) | INTRAMUSCULAR | Status: AC
  Administered 2023-03-04 (×2): 40 mg via INTRAVENOUS
  Filled 2023-03-04 (×2): qty 4

## 2023-03-04 MED ORDER — ALBUTEROL SULFATE (2.5 MG/3ML) 0.083% IN NEBU
INHALATION_SOLUTION | RESPIRATORY_TRACT | Status: AC
  Administered 2023-03-04: 20 mg via RESPIRATORY_TRACT
  Filled 2023-03-04: qty 12

## 2023-03-04 NOTE — Progress Notes (Addendum)
Met with pt sister elaine at the bedside earlier today and again discussed clinical situation, goals of care. She shared that the family has still not spoken about this yet. She believes NOK/son [name] is coming to the hospital today and asked that we be very transparent about what the trajectory of trach/peg/facility would look like. She feels that [employer]" would not find such quality of life acceptable    I called Omari, he is vaguely planning to come to the hospital but isn't sure when. He has not thought about GOC or Code Status since we spoke a few days ago. He asked that I provide updates now over the phone instead of waiting until he comes to the hospital. We talked about concern cor anoxic injury, likelihood of a poor functional outcome.  I worry he is overwhelmed, which is understandable. Advised that I am consulting palliative care to aid in these conversations/decisions which he is agreeable to.    Tessie Fass MSN, AGACNP-BC [facility] 03/04/2023, 3:06 PM    Addendum Son now here, met with everyone and discussed situation laying out two paths forward: trach/PEG/facility vs. Allowing natural passing.  They need time to discuss among themselves and did not rush this.  We will continue to provide full support for patient in interim while family comes to terms with this devastating situation.  Myrla Halsted MD PCCM

## 2023-03-04 NOTE — Progress Notes (Signed)
RT called to bedside by RN due to pt desat in 70s and vent alarming. When RT arrived at bedside pt was in distress, vent peak pressure alarm, and low min ventilation alarm were gong off. Pt BS were very diminished, RT used BVM and pt sats increased to 84%, pt was placed back on full vent support and RT gave 10mg  ALB neb. PT BS were still diminished with slight expiratory wheezes. Pt was moving more air and CCM was notified. Vitals stable, SpO2 100%, FiO2 decreased to 60%.  Another 10 mg alb neb was given per CCM, RN at bedside, vitals stable, RT will monitor as needed.

## 2023-03-04 NOTE — Progress Notes (Signed)
NAME:  April Ellis, MRN:  [id number], DOB:  [birth year], LOS: 5 ADMISSION DATE:  03/16/2023, CONSULTATION DATE: 6/2 REFERRING MD: Doran Durand, CHIEF COMPLAINT: Cardiac arrest  History of Present Illness:  This is a 47 year old female patient With known history of super obesity, asthma/COPD overlap, complex apnea both central and obstructive followed by Dr. Maple Hudson.  She was actually seen in our office on 5/3 at which time she was counseled to stop smoking, and was given a prednisone taper for possible asthmatic exacerbation. Patient was brought in by EMS on 6/2 after being found unresponsive outside of [employer]ers.  Per EMS report she was initially apneic and pulseless she underwent 5 to 7 minutes of ACLS prior to return of spontaneous circulation she was intubated on the scene, after the fact EMS was unsure about whether or not she was truly pulseless.  Apparently following intubation she was agitated and combative requiring Versed.  Initial arterial blood gas pH 7.26, pCO2 70 pO2 534 HCO3 31.  White blood cell count 13.6, lactate 2.6.  Respiratory virus panel negative.  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 Might start weaning versed  6/6 MRI brain. Off cont sedation. Findings on MRI concerning for some degree of anoxic injury (restricted diffusion involving basal ganglia)    Interim History / Subjective:   NAEO  Family did not call to confirm a mtg time   Objective   Blood pressure (!) 121/59, pulse 73, temperature 99.5 F (37.5 C), resp. rate 20, height 5\' 1"  (1.549 m), weight (!) 152.9 kg, SpO2 96 %.    Vent Mode: PRVC FiO2 (%):  [30 %] 30 % Set Rate:  [20 bmp] 20 bmp Vt Set:  [380 mL] 380 mL PEEP:  [8 cmH20] 8 cmH20 Plateau Pressure:  [16 cmH20-18 cmH20] 16 cmH20   Intake/Output Summary (Last 24 hours) at 03/04/2023 0932 Last data filed at 03/04/2023 0900 Gross per 24 hour  Intake 1117.34 ml  Output 2605 ml  Net -1487.66 ml   Filed Weights   03/01/23 0404 03/03/23 0500 03/04/23 0500  Weight: (!) 171.8 kg (!) 171 kg (!) 152.9 kg   General:  Critically ill morbidly obese middle aged F   HEENT: ETT secure anicteric edematous tongue pink mm moderate clear oral secretions  Neuro:  not sedated Does not follow commands. Pupils are 5mm and sluggishly responsive. + cough and a weak gag. To painful stimulation she has fluttering eyelid movements and a left upward gaze. Initiates breaths.  CV: rr cap refill < 3 sec distant heart sounds  PULM:  Diffuse wheeze anteriorly  GI: obese soft + bowel sounds  Extremities: No acute joint deformity  Skin: cdw  Resolved Hospital Problem list     Assessment & Plan:   OOH PEA arrest Anoxic encephalopathy Myoclonic sz AoC hypoxic and hypercarbic resp failure AECOPD  OSA/OHS  Decompensated diastolic HF  Leukocytosis  GOC discussion -anoxic injury is suspected with myoclonic sz, and with area of mild restriction on MRI involving basal ganglia. There  is also concern for possible demyelination.  -CTH with possible possible hypodensity in the L cerebellum P -cont MV support, VAP, PAD -cont steroids, Bds  -completed empiric course rocephin  -holding entresto  -keppra -cont off sedation -neuro following -- prog is poor for good functional outcome and more likely to be veg state vs minimally conscious  -ongoing GOC discussions. Difficulty now  is reaching NOK (young adult son, [name]). Other family members have been readily available but NOK only sporadically. At last communication, had rec to consider QOL etc as we traverse period of neuro-prognostication  -if prolonged (or extended time trial) aggressive care pursued, anticipate trach/peg and care facility  -if time for possible recovery is path pursued, consider repeat MRI in a few days  -will plan to consult palliative care if unable to connect with family 6/7    Best Practice (right click and "Reselect all SmartList Selections" daily)   Diet/type: tubefeeds DVT prophylaxis: prophylactic heparin  GI prophylaxis: PPI Lines: N/A Foley:  Yes, and it is still needed Code Status:  full code Last date of multidisciplinary goals of care discussion [6/4]  Labs   CBC: Recent Labs  Lab March 17, 2023 1618 03/17/23 1628 03/17/23 2018 Mar 17, 2023 2020 02/28/23 0415 03/01/23 0350 03/01/23 1551 03/02/23 0342 03/03/23 0912  WBC 13.6*  --  16.8*  --  12.5* 14.9*  --  16.4* 13.2*  NEUTROABS 8.7*  --   --   --   --   --   --   --   --   HGB 14.0   < > 14.6   < > 13.7  15.0 13.5 15.3* 13.8 13.9  HCT 45.6   < > 46.2*   < > 44.8  44.0 42.2 45.0 44.1 45.2  MCV 99.3  --  96.0  --  95.9 95.3  --  96.7 96.6  PLT 277  --  245  --  238 273  --  255 162   < > = values in this interval not displayed.    Basic Metabolic Panel: Recent Labs  Lab 02/28/23 0415 03/01/23 0350 03/01/23 1551 03/02/23 0342 03/03/23 0912 03/04/23 0237  NA 135  136 140 142 140 139 137  K 4.7  5.1 4.0 4.2 4.5 5.1 5.1  CL 101 105  --  103 102 99  CO2 26 28  --  27 30 33*  GLUCOSE 130* 153*  --  133* 144* 89  BUN 10 13  --  21* 33* 30*  CREATININE 0.67 0.79  --  0.78 0.68 0.65  CALCIUM 7.9* 8.1*  --  8.1* 8.5* 8.3*  MG 2.4 2.3  --  2.2 2.4 2.3  PHOS  --  3.9  --  4.0 3.6 3.3   GFR: Estimated Creatinine Clearance: 125.9 mL/min (by C-G formula based on SCr of 0.65 mg/dL). Recent Labs  Lab 17-Mar-2023 1615  03/17/2023 1618 2023-03-17 1800 2023/03/17 2018 02/28/23 0415 03/01/23 0350 03/02/23 0342 03/03/23 0912  PROCALCITON  --   --   --  <0.10  --   --   --   --   WBC  --    < >  --  16.8* 12.5* 14.9* 16.4* 13.2*  LATICACIDVEN 2.6*  --  1.3  --   --   --   --   --    < > = values in this interval not displayed.    Liver Function Tests: Recent Labs  Lab 03-17-2023 1618 03/01/23 0350 03/03/23 0912  AST  129* 39 21  ALT 134* 87* 51*  ALKPHOS 81 59 62  BILITOT 1.3* 0.6 1.3*  PROT 6.0* 6.0* 6.6  ALBUMIN 3.1* 2.7* 3.1*   No results for input(s): "LIPASE", "AMYLASE" in the last 168 hours. No results for input(s): "AMMONIA" in the last 168 hours.  ABG    Component Value Date/Time   PHART 7.390 03/01/2023 1551   PCO2ART 51.4 (H) 03/01/2023 1551   PO2ART 82 (L) 03/01/2023 1551   HCO3 31.2 (H) 03/01/2023 1551   TCO2 33 (H) 03/01/2023 1551   ACIDBASEDEF 2.0 March 16, 2023 1628   O2SAT 96 03/01/2023 1551     Coagulation Profile: No results for input(s): "INR", "PROTIME" in the last 168 hours.  Cardiac Enzymes: No results for input(s): "CKTOTAL", "CKMB", "CKMBINDEX", "TROPONINI" in the last 168 hours.  HbA1C: Hgb A1c MFr Bld  Date/Time Value Ref Range Status  03/16/2023 08:18 PM 4.8 4.8 - 5.6 % Final    Comment:    (NOTE)         Prediabetes: 5.7 - 6.4         Diabetes: >6.4         Glycemic control for adults with diabetes: <7.0   04/21/2022 04:17 PM 5.0 4.8 - 5.6 % Final    Comment:             Prediabetes: 5.7 - 6.4          Diabetes: >6.4          Glycemic control for adults with diabetes: <7.0     CBG: Recent Labs  Lab 03/03/23 1127 03/03/23 1545 03/03/23 1948 03/04/23 0135 03/04/23 0815  GLUCAP 118* 124* 108* 106* 119*    CRITICAL CARE Performed by: Lanier Clam   Total critical care time: 36 minutes  Critical care time was exclusive of separately billable procedures and treating other patients.  Critical care was necessary to treat or prevent imminent or  life-threatening deterioration.  Critical care was time spent personally by me on the following activities: development of treatment plan with patient and/or surrogate as well as nursing, discussions with consultants, evaluation of patient's response to treatment, examination of patient, obtaining history from patient or surrogate, ordering and performing treatments and interventions, ordering and review of laboratory studies, ordering and review of radiographic studies, pulse oximetry and re-evaluation of patient's condition.  Tessie Fass MSN, AGACNP-BC [facility] Pulmonary/Critical Care Medicine Amion for pager  03/04/2023, 9:32 AM

## 2023-03-05 DIAGNOSIS — Z7189 Other specified counseling: Secondary | ICD-10-CM | POA: Diagnosis not present

## 2023-03-05 DIAGNOSIS — I469 Cardiac arrest, cause unspecified: Secondary | ICD-10-CM | POA: Diagnosis not present

## 2023-03-05 DIAGNOSIS — Z515 Encounter for palliative care: Secondary | ICD-10-CM | POA: Diagnosis not present

## 2023-03-05 LAB — CBC
HCT: 46.4 % — ABNORMAL HIGH (ref 36.0–46.0)
Hemoglobin: 14.9 g/dL (ref 12.0–15.0)
MCH: 30.3 pg (ref 26.0–34.0)
MCHC: 32.1 g/dL (ref 30.0–36.0)
MCV: 94.5 fL (ref 80.0–100.0)
Platelets: 137 10*3/uL — ABNORMAL LOW (ref 150–400)
RBC: 4.91 MIL/uL (ref 3.87–5.11)
RDW: 12.5 % (ref 11.5–15.5)
WBC: 18.6 10*3/uL — ABNORMAL HIGH (ref 4.0–10.5)
nRBC: 0 % (ref 0.0–0.2)

## 2023-03-05 LAB — BASIC METABOLIC PANEL
Anion gap: 11 (ref 5–15)
BUN: 27 mg/dL — ABNORMAL HIGH (ref 6–20)
CO2: 35 mmol/L — ABNORMAL HIGH (ref 22–32)
Calcium: 9 mg/dL (ref 8.9–10.3)
Chloride: 90 mmol/L — ABNORMAL LOW (ref 98–111)
Creatinine, Ser: 0.61 mg/dL (ref 0.44–1.00)
GFR, Estimated: >60 mL/min (ref >60)
Glucose, Bld: 104 mg/dL — ABNORMAL HIGH (ref 70–99)
Potassium: 4.8 mmol/L (ref 3.5–5.1)
Sodium: 136 mmol/L (ref 135–145)

## 2023-03-05 LAB — GLUCOSE, CAPILLARY
Glucose-Capillary: 122 mg/dL — ABNORMAL HIGH (ref 70–99)
Glucose-Capillary: 144 mg/dL — ABNORMAL HIGH (ref 70–99)
Glucose-Capillary: 149 mg/dL — ABNORMAL HIGH (ref 70–99)
Glucose-Capillary: 125 mg/dL — ABNORMAL HIGH (ref 70–99)
Glucose-Capillary: 69 mg/dL — ABNORMAL LOW (ref 70–99)
Glucose-Capillary: 87 mg/dL (ref 70–99)
Glucose-Capillary: 64 mg/dL — ABNORMAL LOW (ref 70–99)

## 2023-03-05 LAB — PHOSPHORUS: Phosphorus: 4 mg/dL (ref 2.5–4.6)

## 2023-03-05 LAB — BRAIN NATRIURETIC PEPTIDE: B Natriuretic Peptide: 88.7 pg/mL (ref 0.0–100.0)

## 2023-03-05 LAB — MAGNESIUM: Magnesium: 2.4 mg/dL (ref 1.7–2.4)

## 2023-03-05 MED ORDER — DEXTROSE 50 % IV SOLN
12.5000 g | Freq: Once | INTRAVENOUS | Status: AC
  Administered 2023-03-05: 12.5 g via INTRAVENOUS
  Filled 2023-03-05: qty 50

## 2023-03-05 MED ORDER — BISACODYL 10 MG RE SUPP
10.0000 mg | Freq: Every day | RECTAL | Status: DC
  Administered 2023-03-05 – 2023-03-06 (×2): 10 mg via RECTAL
  Filled 2023-03-05 (×2): qty 1

## 2023-03-05 MED ORDER — METHYLNALTREXONE BROMIDE 12 MG/0.6ML ~~LOC~~ SOLN
12.0000 mg | Freq: Once | SUBCUTANEOUS | Status: AC
  Administered 2023-03-05: 12 mg via SUBCUTANEOUS
  Filled 2023-03-05: qty 0.6

## 2023-03-05 NOTE — Progress Notes (Signed)
RT attempted to wean pt again at this time and there was no pt effort

## 2023-03-05 NOTE — Progress Notes (Signed)
NAME:  ZAYLEIGH NEDER, MRN:  [id number], DOB:  15-Feb-1976, LOS: 6 ADMISSION DATE:  03/01/2023, CONSULTATION DATE: 6/2 REFERRING MD: Doran Durand, CHIEF COMPLAINT: Cardiac arrest  History of Present Illness:  This is a 47 year old female patient With known history of super obesity, asthma/COPD overlap, complex apnea both central and obstructive followed by Dr. Maple Hudson.  She was actually seen in our office on 5/3 at which time she was counseled to stop smoking, and was given a prednisone taper for possible asthmatic exacerbation. Patient was brought in by EMS on 6/2 after being found unresponsive outside of hairdressers.  Per EMS report she was initially apneic and pulseless she underwent 5 to 7 minutes of ACLS prior to return of spontaneous circulation she was intubated on the scene, after the fact EMS was unsure about whether or not she was truly pulseless.  Apparently following intubation she was agitated and combative requiring Versed.  Initial arterial blood gas pH 7.26, pCO2 70 pO2 534 HCO3 31.  White blood cell count 13.6, lactate 2.6.  Respiratory virus panel negative.  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 Might start weaning versed  6/6 MRI brain. Off cont sedation. Findings on MRI concerning for some degree of anoxic injury (restricted diffusion involving basal ganglia)   Interim History / Subjective:   Myoclonus has resumed. Otherwise comatose. Bronchospasm from yesterday seems improved with diuresis No BM since admit  Objective   Blood pressure (!) 117/91, pulse 85, temperature 100 F (37.8 C), temperature source Axillary, resp. rate 16, height 5\' 1"  (1.549 m), weight (!) 162.2 kg, SpO2 98 %.    Vent Mode: PRVC FiO2 (%):  [40 %-100 %] 40 % Set Rate:  [20 bmp] 20 bmp Vt Set:  [380 mL] 380 mL PEEP:  [8 cmH20] 8 cmH20 Plateau Pressure:  [15 cmH20-20 cmH20] 15 cmH20   Intake/Output Summary (Last 24 hours) at 03/05/2023 1101 Last data filed at 03/05/2023 0800 Gross per 24 hour  Intake 731.47 ml  Output 5125 ml  Net -4393.53 ml    Filed Weights   03/03/23 0500 03/04/23 0500 03/05/23 0220  Weight: (!) 171 kg (!) 152.9 kg (!) 162.2 kg   Unresponsive on vent Pupils large but reactive to light Minimal corneals Not triggering vent Cough+ L arm jerking not to command L gaze preferance, doll's is present GCS 3 otherwise  BMP ok WBC up slightly  Resolved Hospital Problem list     Assessment & Plan:   OOH PEA arrest Anoxic encephalopathy Myoclonic sz AoC hypoxic and hypercarbic resp failure AECOPD  OSA/OHS  Decompensated diastolic HF  Leukocytosis  Suspected ileus GOC discussion -anoxic injury is suspected with myoclonic sz, and with area of mild restriction on MRI involving basal ganglia. There is also concern for possible demyelination.   P -cont MV support, VAP, PAD -cont steroids, Bds  -completed empiric course rocephin  -keppra -cont off sedation -see GOC yesterday, options here are trach/PEG/SNF vs. Allowing natural death, palliative to speak with family today, appreciate Michelle's  help -strengthen bowel regimen  Best Practice (right click and "Reselect all SmartList Selections" daily)   Diet/type: tubefeeds DVT prophylaxis: prophylactic heparin  GI prophylaxis: PPI Lines: N/A Foley:  Yes, and it is still needed Code Status:  full code Last date of  multidisciplinary goals of care discussion [6/7]  33 min cc time Myrla Halsted MD

## 2023-03-05 NOTE — Consult Note (Signed)
Palliative Medicine Inpatient Consult Note  Consulting Provider:  Lanier Clam, NP   Reason for consult:   Palliative Care Consult Services Palliative Medicine Consult  Reason for Consult? GOC cardiac arrest poor neuro prog (likely vegetativs vs minimally conscious). NOK is 46yo son   03/05/2023  HPI:  Per intake H&P --> This is a 47 year old female patient With known history of super obesity, asthma/COPD overlap, complex apnea both central and obstructive. Suffered a PEA arrest with 5-7 minutes of resuscitation efforts ROSC was achieved and  has been hospitalized since this event. Palliative care has been asked to get involved for further goals of care conversations given poor neurological prognosis.   Clinical Assessment/Goals of Care:  *Please note that this is a verbal dictation therefore any spelling or grammatical errors are due to the "Dragon Medical One" system interpretation.  I have reviewed medical records including EPIC notes, labs and imaging, received report from bedside RN, assessed the patient who is lying in bed notable myoclonus in L hand.    I called patients son, April Ellis and left a HIPAA compliant VM. I called the patient's sister April Ellis and April Ellis to further discuss diagnosis prognosis, GOC, EOL wishes, disposition and options.   I introduced Palliative Medicine as specialized medical care for people living with serious illness. It focuses on providing relief from the symptoms and stress of a serious illness. The goal is to improve quality of life for both the patient and the family.  Medical History Review and Understanding:  A review of April Ellis's past medical history was held in the setting of her obstructive sleep apnea, COPD/asthma, & obesity.  Social History:  April Ellis lives in [place].  She is not married and has one son, April Ellis who is 41 years old.  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  She is identified as a woman who was outgoing, outspoken, upbeat, and the life of every party.  She is a woman of faith and practices within Christianity.  Functional and Nutritional State:  Preceding events leading to hospitalization April Ellis was fully functional of all B ADLs and IADLs.  Advance Directives:  A detailed discussion was had today regarding advanced directives.  Unfortunately patient does not have advanced directives therefore her son April Ellis is her Social research officer, government.  Code Status:  Concepts specific to code status, artifical feeding and hydration, continued IV antibiotics and rehospitalization was had.  The difference between a aggressive medical intervention path  and a palliative comfort care path for this patient at this time was had.   We reviewed that one of the difficult decisions that should be considered irregardless is resuscitation status given April Ellis's exceptionally poor health state at this time. Encouraged family to consider DNAR status understanding evidenced based poor outcomes in similar hospitalized patient, as the cause of arrest is likely associated with advanced chronic/terminal illness rather than an easily reversible acute cardio-pulmonary event. I explained that DNAR does not change the medical plan and it only comes into effect after a person has arrested (died).  It is a protective measure to keep [place] from harming the patient in their last moments of life.  Discussion:  I was able to discuss with April Ellis's sisters that April Ellis was an incredibly independent woman and that the rest of her life will likely be to dependent upon machines to keep her alive.  They are aware of her neurologic injury as well  as the long-term implications on her life.    Patient's sisters both attest that her son is struggling with the decision as he feels her ability to blink though not meaningfully and have movement in her  left hand is an indicator that she will recover in a short period of time.  Both April Ellis and April Ellis recognize that this is not realistic and are trying to support April Ellis as best they can.  Unfortunately April Ellis and April Ellis are on the way to a funeral and share with me they have lost multiple family members within the past few months which has been devastating to all of them.  Condolences were offered given that they are now dealing with another tragic situation with their sister.  We further discussed that they had spoken with the critical care team yesterday and recognize the severity of April Ellis's injuries.  They are in agreement with having a meeting and have identified Monday between the hours of 5 and 5:30 would be best.  Discussed the importance of continued conversation with family and their  medical providers regarding overall plan of care and treatment options, ensuring decisions are within the context of the patients values and GOCs.  Decision Maker: April Ellis [phone] (Mobile)   SUMMARY OF RECOMMENDATIONS   Full code at this time -> Strongly recommended consideration of DNAR   Plan for family meeting Monday between 5-5:50PM  Ongoing support  Code Status/Advance Care Planning: FULL CODE  Palliative Prophylaxis:  Aspiration, Bowel Regimen, Delirium Protocol, Frequent Pain Assessment, Oral Care, Palliative Wound Care, and Turn Reposition  Additional Recommendations (Limitations, Scope, Preferences): Continue present care will complex decisions are being made  Psycho-social/Spiritual:  Desire for further Chaplaincy support: Yes patient is a [place] Additional Recommendations: Education on anoxic brain injury   Prognosis: Very poor overall  Discharge Planning: Discharge plan remains uncertain at this time  Vitals:   03/05/23 0300 03/05/23 0409  BP:    Pulse:  74  Resp:  20  Temp: 99.4 F (37.4 C)   SpO2:  100%    Intake/Output Summary (Last 24 hours) at  03/05/2023 0638 Last data filed at 03/04/2023 2300 Gross per 24 hour  Intake 684.77 ml  Output 4785 ml  Net -4100.23 ml   Last Weight  Most recent update: 03/05/2023  2:20 AM    Weight  162.2 kg (357 lb 9.4 oz)              Gen: Middle-aged African-American female acutely ill in appearance HEENT: ET T, dry mucous membranes CV: Regular rate and rhythm PULM: On mechanical ventilatory support ABD: soft/nontender EXT: Generalized edema Neuro: Unarousable  PPS: 10%   This conversation/these recommendations were discussed with patient primary care team, Dr. Katrinka Blazing  Billing based on MDM: High  Problems Addressed: One acute or chronic illness or injury that poses a threat to life or bodily function  Amount and/or Complexity of Data: Category 3:Discussion of management or test interpretation with external physician/other qualified health care professional/appropriate source (not separately reported)  Risks: Decision not to resuscitate or to de-escalate care because of poor prognosis ______________________________________________________ Lamarr Lulas [facility] Palliative Medicine Team Team Cell Phone: [phone] Please utilize secure chat with additional questions, if there is no response within 30 minutes please call the above phone number  Palliative Medicine Team providers are available by phone from 7am to 7pm daily and can be reached through the team cell phone.  Should this patient require assistance outside of these hours, please call the patient's  attending physician.

## 2023-03-05 NOTE — Progress Notes (Signed)
eLink Physician-Brief Progress Note Patient Name: April Ellis DOB: Dec 11, 1975 MRN: [id number]   Date of Service  03/05/2023  HPI/Events of Note  47 year old with a history of recent cardiac arrest with devastating neurological injury.  She has been having intermittent shaking of her left hand/arm but typically due to provocation.  Now she developed at this shaking without provocation.  Does not generalized to the rest of her body.  No other hemodynamic changes.  Previously evaluated with MRI and EEG without evidence of seizures or any intracranial findings.  eICU Interventions  Suspect this is progression of her underlying anoxic injury with continued myoclonic jerking.  No intervention is indicated at this time.     Intervention Category Minor Interventions: Clinical assessment - ordering diagnostic tests  Mccartney Brucks 03/05/2023, 5:18 AM

## 2023-03-06 DIAGNOSIS — I469 Cardiac arrest, cause unspecified: Secondary | ICD-10-CM | POA: Diagnosis not present

## 2023-03-06 LAB — CBC WITH DIFFERENTIAL/PLATELET
Abs Immature Granulocytes: 0.33 10*3/uL — ABNORMAL HIGH (ref 0.00–0.07)
Basophils Absolute: 0.1 10*3/uL (ref 0.0–0.1)
Basophils Relative: 0 %
Eosinophils Absolute: 0.1 10*3/uL (ref 0.0–0.5)
Eosinophils Relative: 0 %
HCT: 43.5 % (ref 36.0–46.0)
Hemoglobin: 14.2 g/dL (ref 12.0–15.0)
Immature Granulocytes: 2 %
Lymphocytes Relative: 7 %
Lymphs Abs: 1.1 10*3/uL (ref 0.7–4.0)
MCH: 30.9 pg (ref 26.0–34.0)
MCHC: 32.6 g/dL (ref 30.0–36.0)
MCV: 94.6 fL (ref 80.0–100.0)
Monocytes Absolute: 0.9 10*3/uL (ref 0.1–1.0)
Monocytes Relative: 6 %
Neutro Abs: 12.8 10*3/uL — ABNORMAL HIGH (ref 1.7–7.7)
Neutrophils Relative %: 85 %
Platelets: 212 10*3/uL (ref 150–400)
RBC: 4.6 MIL/uL (ref 3.87–5.11)
RDW: 12.6 % (ref 11.5–15.5)
WBC: 15.3 10*3/uL — ABNORMAL HIGH (ref 4.0–10.5)
nRBC: 0 % (ref 0.0–0.2)

## 2023-03-06 LAB — BASIC METABOLIC PANEL
Anion gap: 9 (ref 5–15)
BUN: 27 mg/dL — ABNORMAL HIGH (ref 6–20)
CO2: 31 mmol/L (ref 22–32)
Calcium: 9 mg/dL (ref 8.9–10.3)
Chloride: 92 mmol/L — ABNORMAL LOW (ref 98–111)
Creatinine, Ser: 0.62 mg/dL (ref 0.44–1.00)
GFR, Estimated: >60 mL/min (ref >60)
Glucose, Bld: 120 mg/dL — ABNORMAL HIGH (ref 70–99)
Potassium: 4.3 mmol/L (ref 3.5–5.1)
Sodium: 132 mmol/L — ABNORMAL LOW (ref 135–145)

## 2023-03-06 LAB — GLUCOSE, CAPILLARY
Glucose-Capillary: 104 mg/dL — ABNORMAL HIGH (ref 70–99)
Glucose-Capillary: 113 mg/dL — ABNORMAL HIGH (ref 70–99)
Glucose-Capillary: 130 mg/dL — ABNORMAL HIGH (ref 70–99)
Glucose-Capillary: 149 mg/dL — ABNORMAL HIGH (ref 70–99)
Glucose-Capillary: 74 mg/dL (ref 70–99)
Glucose-Capillary: 75 mg/dL (ref 70–99)
Glucose-Capillary: 89 mg/dL (ref 70–99)

## 2023-03-06 LAB — PROCALCITONIN: Procalcitonin: <0.1 ng/mL

## 2023-03-06 LAB — TRIGLYCERIDES: Triglycerides: 108 mg/dL (ref <150)

## 2023-03-06 LAB — BRAIN NATRIURETIC PEPTIDE: B Natriuretic Peptide: 113.4 pg/mL — ABNORMAL HIGH (ref 0.0–100.0)

## 2023-03-06 MED ORDER — ACETAMINOPHEN 160 MG/5ML PO SOLN
650.0000 mg | Freq: Four times a day (QID) | ORAL | Status: DC | PRN
  Administered 2023-03-06 – 2023-03-13 (×6): 650 mg
  Filled 2023-03-06 (×6): qty 20.3

## 2023-03-06 MED ORDER — FUROSEMIDE 10 MG/ML IJ SOLN
40.0000 mg | Freq: Four times a day (QID) | INTRAMUSCULAR | Status: AC
  Administered 2023-03-06 (×2): 40 mg via INTRAVENOUS
  Filled 2023-03-06 (×2): qty 4

## 2023-03-06 NOTE — Progress Notes (Signed)
NAME:  QUANIECE MCFERRIN, MRN:  [id number], DOB:  Jan 04, 1976, LOS: 7 ADMISSION DATE:  03/22/2023, CONSULTATION DATE: 6/2 REFERRING MD: Doran Durand, CHIEF COMPLAINT: Cardiac arrest  History of Present Illness:  This is a 47 year old female patient With known history of super obesity, asthma/COPD overlap, complex apnea both central and obstructive followed by Dr. Maple Hudson.  She was actually seen in our office on 5/3 at which time she was counseled to stop smoking, and was given a prednisone taper for possible asthmatic exacerbation. Patient was brought in by EMS on 6/2 after being found unresponsive outside of [employer]ird[employer]ers.  Per EMS report she was initially apneic and pulseless she underwent 5 to 7 minutes of ACLS prior to return of spontaneous circulation she was intubated on the scene, after the fact EMS was unsure about whether or not she was truly pulseless.  Apparently following intubation she was agitated and combative requiring Versed.  Initial arterial blood gas pH 7.26, pCO2 70 pO2 534 HCO3 31.  White blood cell count 13.6, lactate 2.6.  Respiratory virus panel negative.  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 Might start weaning versed  6/6 MRI brain. Off cont sedation. Findings on MRI concerning for some degree of anoxic injury (restricted diffusion involving basal ganglia)   Interim History / Subjective:   Spiked fever yesterday. Myoclonic now on both arms Otherwise unresponsive  Objective   Blood pressure 103/75, pulse 91, temperature 98.5 F (36.9 C), temperature source Oral, resp. rate 20, height 5\' 1"  (1.549 m), weight (!) 160 kg, SpO2 97 %.    Vent Mode: PRVC FiO2 (%):  [40 %] 40 % Set Rate:  [20 bmp] 20 bmp Vt Set:  [380 mL] 380 mL PEEP:  [5 cmH20-8 cmH20] 5 cmH20 Pressure Support:  [8 cmH20] 8 cmH20 Plateau Pressure:  [12 cmH20-22 cmH20] 12 cmH20   Intake/Output Summary (Last 24 hours) at 03/06/2023 1546 Last data filed at 03/06/2023 1300 Gross per 24 hour  Intake 1035.7 ml  Output 2885 ml  Net -1849.3 ml    Filed Weights   03/04/23 0500 03/05/23 0220 03/06/23 0418  Weight: (!) 152.9 kg (!) 162.2 kg (!) 160 kg   GCS3 Myoclonus bilateral arms Pupils reactive large equal Upward gaze preference Triggering vent +cough Abd soft, hypoactive BS, did have BM yesterday  WBC improved BMP looks okay  Resolved Hospital Problem list     Assessment & Plan:   OOH PEA arrest Anoxic encephalopathy Anoxic myoclonus AoC hypoxic and hypercarbic resp failure AECOPD  OSA/OHS  Decompensated diastolic HF  Leukocytosis  Suspected ileus GOC discussion   P -cont MV support, VAP, PAD -cont steroids, Bds for bronchospasm, will place end date for steroids of 6/11 -completed empiric course rocephin  -keppra -cont off sedation -see GOC 6/7, options here are trach/PEG/SNF vs. Allowing natural death, palliative to speak with family today, appreciate Michelle's help: plan for family meeting tomorrow 6/10 at ~5PM -strengthen bowel regimen  Best Practice (right click and "Reselect all SmartList Selections" daily)  Diet/type: tubefeeds DVT prophylaxis: prophylactic heparin  GI prophylaxis: PPI Lines: N/A Foley:  Yes, and it is still needed Code Status:  full code Last date of multidisciplinary goals of care discussion [6/8]  31 min cc time Myrla Halsted MD

## 2023-03-06 NOTE — Progress Notes (Signed)
eLink Physician-Brief Progress Note Patient Name: ADLEY CASTELLO DOB: [birth year] MRN: [id number]   Date of Service  03/06/2023  HPI/Events of Note  Temperature 101.2, ventilated, recent cardiac arrest with devastating neurological injury  eICU Interventions  Initiate antipyretic Obtain blood cultures, procalcitonin, CBC     Intervention Category Minor Interventions: Clinical assessment - ordering diagnostic tests  Skylynne Schlechter 03/06/2023, 4:39 AM

## 2023-03-07 DIAGNOSIS — Z7189 Other specified counseling: Secondary | ICD-10-CM | POA: Diagnosis not present

## 2023-03-07 DIAGNOSIS — Z66 Do not resuscitate: Secondary | ICD-10-CM

## 2023-03-07 DIAGNOSIS — I469 Cardiac arrest, cause unspecified: Secondary | ICD-10-CM | POA: Diagnosis not present

## 2023-03-07 DIAGNOSIS — Z515 Encounter for palliative care: Secondary | ICD-10-CM | POA: Diagnosis not present

## 2023-03-07 LAB — GLUCOSE, CAPILLARY
Glucose-Capillary: 113 mg/dL — ABNORMAL HIGH (ref 70–99)
Glucose-Capillary: 126 mg/dL — ABNORMAL HIGH (ref 70–99)
Glucose-Capillary: 131 mg/dL — ABNORMAL HIGH (ref 70–99)
Glucose-Capillary: 133 mg/dL — ABNORMAL HIGH (ref 70–99)
Glucose-Capillary: 183 mg/dL — ABNORMAL HIGH (ref 70–99)
Glucose-Capillary: 208 mg/dL — ABNORMAL HIGH (ref 70–99)

## 2023-03-07 LAB — BRAIN NATRIURETIC PEPTIDE: B Natriuretic Peptide: 18.1 pg/mL (ref 0.0–100.0)

## 2023-03-07 LAB — CULTURE, BLOOD (ROUTINE X 2)

## 2023-03-07 NOTE — Progress Notes (Signed)
Initial Nutrition Assessment  DOCUMENTATION CODES:   Morbid obesity  INTERVENTION:   Continue tube feeding via OG:  Vital AF 1.2 at 50 ml/hr  Pro-source TF20 60 mL BID TF at goal provides 1600 kcals, 121 g of protein and 972 mL of free water    NUTRITION DIAGNOSIS:   Inadequate oral intake related to acute illness as evidenced by NPO status.  GOAL:   Patient will meet greater than or equal to 90% of their needs  MONITOR:   Vent status, TF tolerance, Labs, Weight trends, Skin  REASON FOR ASSESSMENT:   Consult, Ventilator Enteral/tube feeding initiation and management  ASSESSMENT:   47 yo female admitted post PEA arrest, acute on chronic respiratory failure requiring intubation, possible anoxic brain injury. Pt currently intubated, TTM, sedated. PMH includes COPD, OSA, obesity, CHF   Visited patient who is on sedated on the vent and tolerating TF's at goal rate.  GOC ongoing-- monitoring for POC   Trach/PEG/SNF vs natural death  Labs reviewed Meds reviewed   Diet Order:   Diet Order             Diet NPO time specified  Diet effective now                   EDUCATION NEEDS:   Not appropriate for education at this time  Skin:  Skin Assessment: Skin Integrity Issues: Skin Integrity Issues:: Other (Comment) Other: small hole on coccyx  Last BM:  6/10 type 4  Height:   Ht Readings from Last 1 Encounters:  02/27/23 5\' 1"  (1.549 m)    Weight:   Wt Readings from Last 1 Encounters:  03/07/23 (!) 153.6 kg    BMI:  Body mass index is 63.98 kg/m.  Estimated Nutritional Needs:   Kcal:  1500-1700 kcals  Protein:  115-130 g  Fluid:  >/= 1.8 L  Leodis Rains, RDN, LDN  Clinical Nutrition

## 2023-03-07 NOTE — Progress Notes (Signed)
NAME:  April Ellis, MRN:  [id number], DOB:  1976/02/25, LOS: 8 ADMISSION DATE:  March 16, 2023, CONSULTATION DATE: 2023-03-16 REFERRING MD: Doran Durand, CHIEF COMPLAINT: Cardiac arrest  History of Present Illness:  This is a 47 year old female patient With known history of super obesity, asthma/COPD overlap, complex apnea both central and obstructive followed by Dr. Maple Hudson.  She was actually seen in our office on 5/3 at which time she was counseled to stop smoking, and was given a prednisone taper for possible asthmatic exacerbation. Patient was brought in by EMS on 03-16-2023 after being found unresponsive outside of hairdressers.  Per EMS report she was initially apneic and pulseless she underwent 5 to 7 minutes of ACLS prior to return of spontaneous circulation she was intubated on the scene, after the fact EMS was unsure about whether or not she was truly pulseless.  Apparently following intubation she was agitated and combative requiring Versed.  Initial arterial blood gas pH 7.26, pCO2 70 pO2 534 HCO3 31.  White blood cell count 13.6, lactate 2.6.  Respiratory virus panel negative.  Critical care was asked to admit  Pertinent  Medical History  Asthma COPD overlap, Chronic rhinitis, morbid obesity, iron deficiency anemia, chronic allergies, chronic diastolic heart failure, dilated cardiomyopathy, hypertension, severe mixed obstructive and central sleep apnea, polycythemia  Significant Hospital Events: Including procedures, antibiotic start and stop dates in addition to other pertinent events   03-16-2023 admitted following 5 to 7 minutes cardiac arrest likely respiratory mediated 6/4 EEG concerning for myoclonic seizures, neurology following, no overnight evens 6/5 talked to family about concerns for brain injury. Might start weaning versed  6/6 MRI brain. Off cont sedation. Findings on MRI concerning for some degree of anoxic injury (restricted diffusion involving basal ganglia)  6/10 palliative care meeting    Interim History / Subjective:   No issues overnight. Remains critically ill on vent support   Objective   Blood pressure 122/68, pulse (!) 105, temperature 99.6 F (37.6 C), temperature source Axillary, resp. rate (!) 23, height 5\' 1"  (1.549 m), weight (!) 153.6 kg, SpO2 95 %.    Vent Mode: PRVC FiO2 (%):  [40 %-80 %] 40 % Set Rate:  [20 bmp] 20 bmp Vt Set:  [380 mL] 380 mL PEEP:  [5 cmH20-8 cmH20] 5 cmH20 Pressure Support:  [8 cmH20] 8 cmH20 Plateau Pressure:  [12 cmH20-21 cmH20] 21 cmH20   Intake/Output Summary (Last 24 hours) at 03/07/2023 0749 Last data filed at 03/07/2023 0700 Gross per 24 hour  Intake 1130 ml  Output 5650 ml  Net -4520 ml   Filed Weights   03/05/23 0220 03/06/23 0418 03/07/23 0448  Weight: (!) 162.2 kg (!) 160 kg (!) 153.6 kg    Gen: obese FM, intubated on life support, gcs 3 HEENT: pupils reactive, upward gaze  Neuro: occasional myoclonus  Heart: RRR s1 s2 Lungs: BL vented breaths  Abd: soft, mildly distended    Resolved Hospital Problem list     Assessment & Plan:   OOH PEA arrest Anoxic encephalopathy Anoxic myoclonus AoC hypoxic and hypercarbic resp failure AECOPD  OSA/OHS  Decompensated diastolic HF  Leukocytosis  Suspected ileus GOC discussion  P: Remains on full MV support, VAP ppx and PAD guideline sedations  Weaning from steroids, continuing bronchodilators - completed a course of abx  - remains on keppra  - remains off continuous sedations  - prognosis is poor - I appreciate palliative care discussions - options are to move forward with trach/peg/LTACH vs comfort care transition -  PMT meeting this evening planned   Best Practice (right click and "Reselect all SmartList Selections" daily)   Diet/type: tubefeeds DVT prophylaxis: prophylactic heparin  GI prophylaxis: PPI Lines: N/A Foley:  Yes, and it is still needed Code Status:  full code Last date of multidisciplinary goals of care discussion [6/8]  This  patient is critically ill with multiple organ system failure; which, requires frequent high complexity decision making, assessment, support, evaluation, and titration of therapies. This was completed through the application of advanced monitoring technologies and extensive interpretation of multiple databases. During this encounter critical care time was devoted to patient care services described in this note for 31 minutes.  [name], DO [facility] 03/07/2023 7:49 AM

## 2023-03-07 NOTE — Progress Notes (Signed)
Palliative Medicine Inpatient Follow Up Note HPI: This is a 47 year old female patient With known history of super obesity, asthma/COPD overlap, complex apnea both central and obstructive. Suffered a PEA arrest with 5-7 minutes of resuscitation efforts ROSC was achieved and  has been hospitalized since this event. Palliative care has been asked to get involved for further goals of care conversations given poor neurological prognosis.   Today's Discussion 03/07/2023  *Please note that this is a verbal dictation therefore any spelling or grammatical errors are due to the "Dragon Medical One" system interpretation.  Chart reviewed inclusive of vital signs, progress notes, laboratory results, and diagnostic images.   Family meeting held this evening in the presence of Magaret's sisters, Belenda Cruise, and Genella Rife (on speaker-phone) and son [name].   We discussed patients present health state. I was able to summarize prior conversations with the healthcare team. We reviewed patients prior history of COPD, asthma, obesity, and sleep apnea.   We discussed the events leading to patients hospitalization. Reviewed patients PEA arrest in the setting of suspected respiratory distress. Discussed hospitalization inclusive of myoclonic seizures.   We reviewed what her life may look like if the family chooses to proceed with aggressive interventions such as tracheostomy and gastrostomy tube. I shared that Ceirra is very likely to be in the bed much of the remainder of her life and susceptible to infections of her lungs, urinary tract, among others. We reviewed the discomfort associated with long term vent support. We reviewed the likely degradation of their skin. We reviewed the reality that Samyukta will most probably require care for the rest of their life.     I shared the alternative to the above aggressive measures would be to allow comfort and dignity as Doyne transitions from this life to the next. I shared  that this could be done through comfort focused care inclusive of removing her from vent support and only giving medications to alleviate pain and suffering.  Created space and opportunity for patients family to explore thoughts feelings and fears regarding Elverda's current medical situation. They share that this is a great amount to digest. We reviewed allowing time for them to speak to one another separately. We further discussed code status and patients family are all in agreement that if she has another cardiac arrest it is best that we do not attempt resuscitative efforts.   Questions and concerns addressed/Palliative Support Provided.   Objective Assessment: Vital Signs Vitals:   03/07/23 1500 03/07/23 1511  BP: 116/67   Pulse: 99   Resp: (!) 21   Temp:    SpO2: 97% 96%    Intake/Output Summary (Last 24 hours) at 03/07/2023 1631 Last data filed at 03/07/2023 1500 Gross per 24 hour  Intake 1259.61 ml  Output 5380 ml  Net -4120.39 ml   Last Weight  Most recent update: 03/07/2023  4:48 AM    Weight  153.6 kg (338 lb 10 oz)              Gen: Middle-aged African-American female acutely ill in appearance HEENT: ETT, dry mucous membranes CV: Regular rate and rhythm PULM: On mechanical ventilatory support ABD: soft/nontender EXT: Generalized edema Neuro: Unarousable  SUMMARY OF RECOMMENDATIONS   DNAR  Patients family understand her poor health they are deciding what to do next --> Open and honest conversations held regarding continued aggressive efforts versus comfort care  Plan for follow up in the next 1-2 days to further identify plan of care  PMT  will continue to provide holistic support  Time Spent: 22 Billing based on MDM: High ______________________________________________________________________________________ Lamarr Lulas [facility] Palliative Medicine Team Team Cell Phone: [phone] Please utilize secure chat with additional questions, if there is  no response within 30 minutes please call the above phone number  Palliative Medicine Team providers are available by phone from 7am to 7pm daily and can be reached through the team cell phone.  Should this patient require assistance outside of these hours, please call the patient's attending physician.

## 2023-03-08 DIAGNOSIS — I469 Cardiac arrest, cause unspecified: Secondary | ICD-10-CM | POA: Diagnosis not present

## 2023-03-08 LAB — CBC WITH DIFFERENTIAL/PLATELET
Abs Immature Granulocytes: 0.55 10*3/uL — ABNORMAL HIGH (ref 0.00–0.07)
Basophils Absolute: 0.1 10*3/uL (ref 0.0–0.1)
Basophils Relative: 0 %
Eosinophils Absolute: 0.3 10*3/uL (ref 0.0–0.5)
Eosinophils Relative: 2 %
HCT: 45 % (ref 36.0–46.0)
Hemoglobin: 14.8 g/dL (ref 12.0–15.0)
Immature Granulocytes: 3 %
Lymphocytes Relative: 12 %
Lymphs Abs: 2.3 10*3/uL (ref 0.7–4.0)
MCH: 30.8 pg (ref 26.0–34.0)
MCHC: 32.9 g/dL (ref 30.0–36.0)
MCV: 93.8 fL (ref 80.0–100.0)
Monocytes Absolute: 2.1 10*3/uL — ABNORMAL HIGH (ref 0.1–1.0)
Monocytes Relative: 11 %
Neutro Abs: 14.3 10*3/uL — ABNORMAL HIGH (ref 1.7–7.7)
Neutrophils Relative %: 72 %
Platelets: 185 10*3/uL (ref 150–400)
RBC: 4.8 MIL/uL (ref 3.87–5.11)
RDW: 12.6 % (ref 11.5–15.5)
WBC: 19.6 10*3/uL — ABNORMAL HIGH (ref 4.0–10.5)
nRBC: 0 % (ref 0.0–0.2)

## 2023-03-08 LAB — GLUCOSE, CAPILLARY
Glucose-Capillary: 151 mg/dL — ABNORMAL HIGH (ref 70–99)
Glucose-Capillary: 162 mg/dL — ABNORMAL HIGH (ref 70–99)
Glucose-Capillary: 137 mg/dL — ABNORMAL HIGH (ref 70–99)
Glucose-Capillary: 214 mg/dL — ABNORMAL HIGH (ref 70–99)
Glucose-Capillary: 141 mg/dL — ABNORMAL HIGH (ref 70–99)
Glucose-Capillary: 110 mg/dL — ABNORMAL HIGH (ref 70–99)
Glucose-Capillary: 115 mg/dL — ABNORMAL HIGH (ref 70–99)

## 2023-03-08 LAB — BASIC METABOLIC PANEL
Anion gap: 11 (ref 5–15)
BUN: 33 mg/dL — ABNORMAL HIGH (ref 6–20)
CO2: 31 mmol/L (ref 22–32)
Calcium: 9.5 mg/dL (ref 8.9–10.3)
Chloride: 94 mmol/L — ABNORMAL LOW (ref 98–111)
Creatinine, Ser: 0.58 mg/dL (ref 0.44–1.00)
GFR, Estimated: >60 mL/min (ref >60)
Glucose, Bld: 138 mg/dL — ABNORMAL HIGH (ref 70–99)
Potassium: 4.2 mmol/L (ref 3.5–5.1)
Sodium: 136 mmol/L (ref 135–145)

## 2023-03-08 LAB — BRAIN NATRIURETIC PEPTIDE: B Natriuretic Peptide: 14.1 pg/mL (ref 0.0–100.0)

## 2023-03-08 NOTE — Progress Notes (Signed)
NAME:  April Ellis, MRN:  [id number], DOB:  1976-05-04, LOS: 9 ADMISSION DATE:  03/10/2023, CONSULTATION DATE: 6/2 REFERRING MD: Doran Durand, CHIEF COMPLAINT: Cardiac arrest  History of Present Illness:  This is a 47 year old female patient With known history of super obesity, asthma/COPD overlap, complex apnea both central and obstructive followed by Dr. Maple Hudson.  She was actually seen in our office on 5/3 at which time she was counseled to stop smoking, and was given a prednisone taper for possible asthmatic exacerbation. Patient was brought in by EMS on 6/2 after being found unresponsive outside of [employer].  Per EMS report she was initially apneic and pulseless she underwent 5 to 7 minutes of ACLS prior to return of spontaneous circulation she was intubated on the scene, after the fact EMS was unsure about whether or not she was truly pulseless.  Apparently following intubation she was agitated and combative requiring Versed.  Initial arterial blood gas pH 7.26, pCO2 70 pO2 534 HCO3 31.  White blood cell count 13.6, lactate 2.6.  Respiratory virus panel negative.  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 Might start weaning versed  6/6 MRI brain. Off cont sedation. Findings on MRI concerning for some degree of anoxic injury (restricted diffusion involving basal ganglia)  6/10 palliative care meeting, now DNR    Interim History / Subjective:   PMT meeting yesterday went well. Currently a DNR. Still discussing possible next steps   Objective   Blood pressure 109/79, pulse 100, temperature 99.1 F (37.3 C), temperature source Axillary, resp. rate (!) 23, height 5\' 1"  (1.549 m), weight (!) 154.5 kg, SpO2 94 %.    Vent Mode: PRVC FiO2 (%):  [40 %] 40 % Set Rate:  [20 bmp] 20 bmp Vt Set:  [380 mL] 380 mL PEEP:  [5 cmH20] 5 cmH20 Plateau Pressure:  [15 cmH20-16 cmH20] 15 cmH20   Intake/Output Summary (Last 24 hours) at 03/08/2023 4098 Last data filed at 03/08/2023 0800 Gross per 24 hour  Intake 1572.19 ml  Output 2880 ml  Net -1307.81 ml   Filed Weights   03/06/23 0418 03/07/23 0448 03/08/23 0411  Weight: (!) 160 kg (!) 153.6 kg (!) 154.5 kg    Gen: obese fm, intubated, on mechanical support, unresponsive  HEENT: pupils reactive Neuro: myoclonus with sedation decreased  Heart: RRR s1 s2  Lungs: BL mechanical breath sounds  Abd: soft NT ND   Resolved Hospital Problem list     Assessment & Plan:   OOH PEA arrest Anoxic encephalopathy Anoxic myoclonus AoC hypoxic and hypercarbic resp failure AECOPD  OSA/OHS  Decompensated diastolic HF  Leukocytosis  Suspected ileus GOC discussion  P: Patient remains on full MV support Continue VAP ppx, PAD guideline sedation  Complete abx course  Wean from steroids  Continue keppra  Off sedation  Neuro prognosis is poor  Agree with DNR  We appreciate palliative care team discussions    Best Practice (right click and "Reselect all SmartList Selections" daily)   Diet/type: tubefeeds DVT prophylaxis:  prophylactic heparin  GI prophylaxis: PPI Lines: N/A Foley:  Yes, and it is still needed Code Status:  full code Last date of multidisciplinary goals of care discussion [6/8]  This patient is critically ill with multiple organ system failure; which, requires frequent high complexity decision making, assessment, support, evaluation,  and titration of therapies. This was completed through the application of advanced monitoring technologies and extensive interpretation of multiple databases. During this encounter critical care time was devoted to patient care services described in this note for 31 minutes.   [name], DO [facility] 03/08/2023 9:09 AM

## 2023-03-09 DIAGNOSIS — I469 Cardiac arrest, cause unspecified: Secondary | ICD-10-CM | POA: Diagnosis not present

## 2023-03-09 LAB — CBC WITH DIFFERENTIAL/PLATELET
Abs Immature Granulocytes: 0.42 10*3/uL — ABNORMAL HIGH (ref 0.00–0.07)
Basophils Absolute: 0.1 10*3/uL (ref 0.0–0.1)
Basophils Relative: 0 %
Eosinophils Absolute: 0.4 10*3/uL (ref 0.0–0.5)
Eosinophils Relative: 2 %
HCT: 41.5 % (ref 36.0–46.0)
Hemoglobin: 13.2 g/dL (ref 12.0–15.0)
Immature Granulocytes: 2 %
Lymphocytes Relative: 11 %
Lymphs Abs: 2 10*3/uL (ref 0.7–4.0)
MCH: 29.7 pg (ref 26.0–34.0)
MCHC: 31.8 g/dL (ref 30.0–36.0)
MCV: 93.3 fL (ref 80.0–100.0)
Monocytes Absolute: 1.7 10*3/uL — ABNORMAL HIGH (ref 0.1–1.0)
Monocytes Relative: 9 %
Neutro Abs: 13.2 10*3/uL — ABNORMAL HIGH (ref 1.7–7.7)
Neutrophils Relative %: 76 %
Platelets: 200 10*3/uL (ref 150–400)
RBC: 4.45 MIL/uL (ref 3.87–5.11)
RDW: 12.5 % (ref 11.5–15.5)
WBC: 17.7 10*3/uL — ABNORMAL HIGH (ref 4.0–10.5)
nRBC: 0 % (ref 0.0–0.2)

## 2023-03-09 LAB — BASIC METABOLIC PANEL
Anion gap: 10 (ref 5–15)
BUN: 33 mg/dL — ABNORMAL HIGH (ref 6–20)
CO2: 28 mmol/L (ref 22–32)
Calcium: 9 mg/dL (ref 8.9–10.3)
Chloride: 97 mmol/L — ABNORMAL LOW (ref 98–111)
Creatinine, Ser: 0.46 mg/dL (ref 0.44–1.00)
GFR, Estimated: >60 mL/min (ref >60)
Glucose, Bld: 148 mg/dL — ABNORMAL HIGH (ref 70–99)
Potassium: 4 mmol/L (ref 3.5–5.1)
Sodium: 135 mmol/L (ref 135–145)

## 2023-03-09 LAB — TRIGLYCERIDES: Triglycerides: 64 mg/dL (ref <150)

## 2023-03-09 LAB — GLUCOSE, CAPILLARY
Glucose-Capillary: 154 mg/dL — ABNORMAL HIGH (ref 70–99)
Glucose-Capillary: 134 mg/dL — ABNORMAL HIGH (ref 70–99)
Glucose-Capillary: 161 mg/dL — ABNORMAL HIGH (ref 70–99)
Glucose-Capillary: 125 mg/dL — ABNORMAL HIGH (ref 70–99)

## 2023-03-09 LAB — BRAIN NATRIURETIC PEPTIDE: B Natriuretic Peptide: 27 pg/mL (ref 0.0–100.0)

## 2023-03-09 NOTE — Progress Notes (Signed)
Daily Progress Note   Patient Name: April Ellis       Date: 03/09/2023 DOB: Sep 03, 1976  Age: 47 y.o. MRN#: [id number] Attending Physician: [name], DO Primary Care Physician: Arnette Felts, FNP Admit Date: 03/07/2023  Reason for Consultation/Follow-up: Establishing goals of care  Subjective: Patient intubated and sedated. No family at bedside.  Length of Stay: 10  Current Medications: Scheduled Meds:   arformoterol  15 mcg Nebulization BID   bisacodyl  10 mg Rectal Q0600   budesonide (PULMICORT) nebulizer solution  0.5 mg Nebulization BID   Chlorhexidine Gluconate Cloth  6 each Topical Daily   feeding supplement (PROSource TF20)  60 mL Per Tube BID   heparin  5,000 Units Subcutaneous Q8H   ipratropium-albuterol  3 mL Nebulization Q4H   mouth rinse  15 mL Mouth Rinse Q2H   pantoprazole (PROTONIX) IV  40 mg Intravenous QHS   revefenacin  175 mcg Nebulization Daily    Continuous Infusions:  sodium chloride Stopped (03/03/23 0401)   sodium chloride Stopped (03/05/23 1747)   sodium chloride 10 mL/hr at 03/09/23 1600   feeding supplement (VITAL AF 1.2 CAL) 50 mL/hr at 03/09/23 1600   levETIRAcetam Stopped (03/09/23 0947)    PRN Meds: sodium chloride, sodium chloride, acetaminophen, albuterol, docusate, mouth rinse, polyethylene glycol  Physical Exam Vitals reviewed.  Constitutional:      Interventions: She is sedated and intubated.  Pulmonary:     Effort: She is intubated.             Vital Signs: BP 98/73   Pulse (!) 115   Temp 100.3 F (37.9 C) (Axillary)   Resp (!) 25   Ht 5\' 1"  (1.549 m)   Wt (!) 154.2 kg   SpO2 95%   BMI 64.23 kg/m  SpO2: SpO2: 95 % O2 Device: O2 Device: Ventilator O2 Flow Rate:    Intake/output summary:  Intake/Output Summary  (Last 24 hours) at 03/09/2023 1643 Last data filed at 03/09/2023 1600 Gross per 24 hour  Intake 1607.1 ml  Output 1760 ml  Net -152.9 ml   LBM: Last BM Date : 03/07/23 Baseline Weight: Weight: (!) 169.2 kg Most recent weight: Weight: (!) 154.2 kg       Palliative Assessment/Data: 10      Patient Active Problem List  Diagnosis Date Noted   On mechanically assisted ventilation (HCC) 03/03/2023   Myoclonic seizure (HCC) 03/03/2023   Anoxic encephalopathy (HCC) 03/03/2023   Goals of care, counseling/discussion 03/03/2023   DNR (do not resuscitate) discussion 03/03/2023   Encephalopathy acute 03/02/2023   Anoxic brain damage (HCC) 03/02/2023   Cardiac arrest (HCC) 03/02/2023   Seizure (HCC) 02/28/2023   Acute respiratory failure (HCC) 03/24/2023   COVID-19 virus infection 12/06/2022   Light cigarette smoker 11/19/2022   Asthma 11/13/2022   Chronic HFrEF (heart failure with reduced ejection fraction) (HCC) 11/13/2022   Syncope 11/13/2022   Anemia 11/01/2022   Diastolic heart failure (HCC) 11/01/2022   Family history of malignant neoplasm of digestive organs 11/01/2022   Asthma with COPD with exacerbation (HCC) 08/15/2022   Chronic combined systolic and diastolic CHF (congestive heart failure) (HCC) 08/15/2022   Polycythemia 08/15/2022   Essential hypertension 08/15/2022   Depression 08/15/2022   DCM (dilated cardiomyopathy) (HCC)    Acute combined systolic and diastolic heart failure (HCC)    Acute respiratory failure with hypoxia (HCC) 06/08/2022   Leg swelling 06/08/2022   Neuritis of right ulnar nerve 03/18/2021   Thrombocytosis 04/04/2020   Obstructive sleep apnea 06/13/2019   Nocturnal hypoxemia 06/13/2019   Daytime somnolence 04/04/2019   Dyspnea 02/15/2019   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   Irregular periods 12/22/2016   Allergic reactions 08/16/2016   Chronic rhinitis 08/16/2016   Asthma, chronic, unspecified asthma severity, with acute exacerbation 09/30/2014    Leukocytosis 09/30/2014   Iron deficiency anemia 08/14/2014   History of tobacco abuse 08/14/2014   Atypical chest pain 06/27/2014   Tobacco abuse 05/12/2014   Asthma-COPD overlap syndrome 05/01/2014   Morbid obesity with BMI of 60.0-69.9, adult (HCC) 12/19/2008   ELEVATED BLOOD PRESSURE WITHOUT DIAGNOSIS OF HYPERTENSION 12/19/2008    Palliative Care Assessment & Plan   Patient Profile: This is a 47 year old female patient With known history of super obesity, asthma/COPD overlap, complex apnea both central and obstructive. Suffered a PEA arrest with 5-7 minutes of resuscitation efforts ROSC was achieved and has been hospitalized since this event. Palliative care has been asked to get involved for further goals of care conversations given poor neurological prognosis.   Assessment: Patient is sedated and intubated. Discussed with attending-- no changes in neurologic prognosis. Attempted to call the patient's sister [name] to follow up on the family meeting Monday. Left a voicemail and encouraged her to call back.  Recommendations/Plan: DNAR  Plan for follow up tomorrow with family to further identify plan of care PMT will continue to provide holistic support   Code Status:    Code Status Orders  (From admission, onward)           Start     Ordered   03/07/23 1754  Do not attempt resuscitation (DNR)  Continuous       Question Answer Comment  If patient has no pulse and is not breathing Do Not Attempt Resuscitation   If patient has a pulse and/or is breathing: Medical Treatment Goals LIMITED ADDITIONAL INTERVENTIONS: Use medication/IV fluids and cardiac monitoring as indicated; Do not use intubation or mechanical ventilation (DNI), also provide comfort medications.  Transfer to Progressive/Stepdown as indicated, avoid Intensive Care.   Consent: Discussion documented in EHR or advanced directives reviewed      03/07/23 1753           Code Status History     Date Active  Date Inactive Code Status Order ID Comments User Context  03/15/2023 1820 03/07/2023 1753 Full Code [id number]  Simonne Martinet, NP ED   12/06/2022 1910 12/08/2022 1824 Full Code [id number]  Lewie Chamber, MD ED   11/13/2022 0428 11/14/2022 1715 Full Code [id number]  Hillary Bow, DO ED   08/15/2022 1200 08/16/2022 1535 Full Code [id number]  Clydie Braun, MD ED   06/08/2022 1634 06/12/2022 1749 Full Code [id number]  Alberteen Sam, MD ED   04/04/2020 1420 04/06/2020 2126 Full Code [id number]  Clydie Braun, MD ED   03/05/2020 1106 03/05/2020 2040 Full Code [id number]  Richarda Overlie, MD Inpatient   02/15/2019 2245 02/16/2019 1731 Full Code [id number]  Pearson Grippe, MD ED   11/20/2018 1747 11/21/2018 1627 Full Code [id number]  Osvaldo Shipper, MD Inpatient   08/27/2015 0234 08/28/2015 1636 Full Code [id number]  Lorretta Harp, MD ED   04/28/2015 1700 04/30/2015 1557 Full Code [id number]  Marinda Elk, MD Inpatient   12/01/2014 0336 12/01/2014 1638 Full Code [id number]  Ron Parker, MD Inpatient   09/30/2014 2054 10/02/2014 2204 Full Code [id number]  Ozella Rocks, MD Inpatient   08/13/2014 2344 08/14/2014 1631 Full Code [id number]  Yevonne Pax, MD Inpatient   06/27/2014 1142 06/28/2014 1801 Full Code [id number]  Catarina Hartshorn, MD Inpatient   05/12/2014 2209 05/14/2014 1732 Full Code [id number]  Elgergawy, Leana Roe, MD Inpatient       Care plan was discussed with Dr. Tonia Brooms  Thank you for allowing the Palliative Medicine Team to assist in the care of this patient.  Time spent: 35 minutes   Detailed review of medical records ( labs, imaging, vital signs), medically appropriate exam, documenting clinical information, medication management, coordination of care.  Sherryll Burger, NP  Please contact Palliative Medicine Team phone at [phone] for questions and concerns.

## 2023-03-09 NOTE — Progress Notes (Signed)
NAME:  April Ellis, MRN:  [id number], DOB:  1976/03/05, LOS: 10 ADMISSION DATE:  03/15/2023, CONSULTATION DATE: 6/2 REFERRING MD: Doran Durand, CHIEF COMPLAINT: Cardiac arrest  History of Present Illness:  This is a 47 year old female patient With known history of super obesity, asthma/COPD overlap, complex apnea both central and obstructive followed by Dr. Maple Hudson.  She was actually seen in our office on 5/3 at which time she was counseled to stop smoking, and was given a prednisone taper for possible asthmatic exacerbation. Patient was brought in by EMS on 6/2 after being found unresponsive outside of ha[employer]ers.  Per EMS report she was initially apneic and pulseless she underwent 5 to 7 minutes of ACLS prior to return of spontaneous circulation she was intubated on the scene, after the fact EMS was unsure about whether or not she was truly pulseless.  Apparently following intubation she was agitated and combative requiring Versed.  Initial arterial blood gas pH 7.26, pCO2 70 pO2 534 HCO3 31.  White blood cell count 13.6, lactate 2.6.  Respiratory virus panel negative.  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 Might start weaning versed  6/6 MRI brain. Off cont sedation. Findings on MRI concerning for some degree of anoxic injury (restricted diffusion involving basal ganglia)  6/10 palliative care meeting, now  DNR   Interim History / Subjective:   Currently DNR.  Ongoing palliative care meetings with patient's family.  We appreciate the team's ongoing goals of care discussion with family members.  Objective   Blood pressure 120/72, pulse (!) 107, temperature 99.2 F (37.3 C), temperature source Axillary, resp. rate (!) 22, height 5\' 1"  (1.549 m), weight (!) 154.2 kg, SpO2 95 %.    Vent Mode: PRVC FiO2 (%):  [40 %] 40 % Set Rate:  [20 bmp] 20 bmp Vt Set:  [380 mL] 380 mL PEEP:  [5 cmH20] 5 cmH20 Pressure Support:  [10 cmH20] 10 cmH20 Plateau Pressure:  [8 cmH20-15 cmH20] 8 cmH20   Intake/Output Summary (Last 24 hours) at 03/09/2023 0840 Last data filed at 03/09/2023 0600 Gross per 24 hour  Intake 1462.13 ml  Output 2075 ml  Net -612.87 ml   Filed Weights   03/07/23 0448 03/08/23 0411 03/09/23 0422  Weight: (!) 153.6 kg (!) 154.5 kg (!) 154.2 kg    Gen: Obese female intubated on mechanical life support, unresponsive off sedation HEENT: Pupils are reactive, NCAT, endotracheal tube in place Neuro: When sedation is decreased she has mild clonus with some stimulation Heart: Regular rate rhythm, S1-S2 Lungs: Bilateral mechanically ventilated breath sounds Abd: Obese soft, nontender, nondistended   Resolved Hospital Problem list     Assessment & Plan:   OOH PEA arrest Anoxic encephalopathy Anoxic myoclonus AoC hypoxic and hypercarbic resp failure AECOPD  OSA/OHS  Decompensated diastolic HF  Leukocytosis  Suspected ileus GOC discussion  P: Patient remains on full mechanical vent support Continue ventilator associated pneumonia prophylaxis, PAD guidelines sedation Has completed a course of antibiotics Wean off steroids Continue Keppra Remains off sedation  Has not had any significant or meaningful neurologic recovery. Unfortunately this is a poor prognosis I agree with the DNR change. We appreciate the palliative care ongoing discussions.  Would consider comfort care  transition which I think is appropriate.  We will await further decisions from family Continue tube feeds.   Best Practice (right click and "Reselect all SmartList Selections" daily)   Diet/type: tubefeeds DVT prophylaxis: prophylactic heparin  GI prophylaxis: PPI Lines: N/A Foley:  Yes, and it is still needed Code Status:  full code Last date of multidisciplinary goals of care discussion [6/8]  This patient is critically ill with multiple organ system failure; which, requires frequent high complexity decision making, assessment, support, evaluation, and titration of therapies. This was completed through the application of advanced monitoring technologies and extensive interpretation of multiple databases. During this encounter critical care time was devoted to patient care services described in this note for 31 minutes.   [name], DO [facility] 03/09/2023 8:40 AM

## 2023-03-10 DIAGNOSIS — Z515 Encounter for palliative care: Secondary | ICD-10-CM | POA: Diagnosis not present

## 2023-03-10 DIAGNOSIS — Z7189 Other specified counseling: Secondary | ICD-10-CM | POA: Diagnosis not present

## 2023-03-10 DIAGNOSIS — G931 Anoxic brain damage, not elsewhere classified: Secondary | ICD-10-CM | POA: Diagnosis not present

## 2023-03-10 LAB — GLUCOSE, CAPILLARY
Glucose-Capillary: 111 mg/dL — ABNORMAL HIGH (ref 70–99)
Glucose-Capillary: 112 mg/dL — ABNORMAL HIGH (ref 70–99)
Glucose-Capillary: 117 mg/dL — ABNORMAL HIGH (ref 70–99)
Glucose-Capillary: 120 mg/dL — ABNORMAL HIGH (ref 70–99)
Glucose-Capillary: 129 mg/dL — ABNORMAL HIGH (ref 70–99)
Glucose-Capillary: 155 mg/dL — ABNORMAL HIGH (ref 70–99)

## 2023-03-10 LAB — CULTURE, BLOOD (ROUTINE X 2)
Culture: NO GROWTH
Culture: NO GROWTH

## 2023-03-10 NOTE — Progress Notes (Signed)
   Palliative Medicine Inpatient Follow Up Note HPI: This is a 47 year old female patient With known history of super obesity, asthma/COPD overlap, complex apnea both central and obstructive. Suffered a PEA arrest with 5-7 minutes of resuscitation efforts ROSC was achieved and  has been hospitalized since this event. Palliative care has been asked to get involved for further goals of care conversations given poor neurological prognosis.   Today's Discussion 03/10/2023  *Please note that this is a verbal dictation therefore any spelling or grammatical errors are due to the "[employer]" system interpretation.  Chart reviewed inclusive of vital signs, progress notes, laboratory results, and diagnostic images.   I assessed April Ellis this morning. There appear to be no major changes over the course of the week.   I have called patients sister, April Ellis who shares that it has been very difficult to speak to patients son, April Ellis. April Ellis expresses that she does not know what to do and is tearful over the phone. We discussed considering a conversation whereby she and her sisters take the decision off of his shoulders. I shared that at long as he is in agreement with them making the decision it may allow [place] to move forward for desired wishes.  April Ellis is thankful for our time and hopes to use the above approach in conversations tonight, If this is unsuccessful she is hopeful to have another in person meeting which I shared that I would be happy to facilitate.   Questions and concerns addressed/Palliative Support Provided.   Objective Assessment: Vital Signs Vitals:   03/10/23 0800 03/10/23 0801  BP: 123/68   Pulse: 100   Resp: 18   Temp:    SpO2: 98% 98%    Intake/Output Summary (Last 24 hours) at 03/10/2023 1018 Last data filed at 03/10/2023 0800 Gross per 24 hour  Intake 1580.79 ml  Output 1635 ml  Net -54.21 ml    Last Weight  Most recent update: 03/10/2023  4:09 AM    Weight  151.7 kg  (334 lb 7 oz)              Gen: Middle-aged African-American female acutely ill in appearance HEENT: ETT, dry mucous membranes CV: Regular rate and rhythm PULM: On mechanical ventilatory support ABD: soft/nontender EXT: Generalized edema Neuro: Unarousable  SUMMARY OF RECOMMENDATIONS   DNAR  Patients family continue to discuss what path should be taken moving forward. Patients son, April Ellis is struggling with decisions (Trach+PEG vs. Comfort Care)  Plan for follow up tomorrow  PMT will continue to provide holistic support  Billing based on MDM: High ______________________________________________________________________________________ Lamarr Lulas [facility] Palliative Medicine Team Team Cell Phone: [phone] Please utilize secure chat with additional questions, if there is no response within 30 minutes please call the above phone number  Palliative Medicine Team providers are available by phone from 7am to 7pm daily and can be reached through the team cell phone.  Should this patient require assistance outside of these hours, please call the patient's attending physician.

## 2023-03-10 NOTE — Progress Notes (Signed)
NAME:  April Ellis, MRN:  [id number], DOB:  1976-03-30, LOS: 11 ADMISSION DATE:  03/08/2023, CONSULTATION DATE: 6/2 REFERRING MD: Doran Durand, CHIEF COMPLAINT: Cardiac arrest  History of Present Illness:  This is a 47 year old female patient With known history of super obesity, asthma/COPD overlap, complex apnea both central and obstructive followed by Dr. Maple Hudson.  She was actually seen in our office on 5/3 at which time she was counseled to stop smoking, and was given a prednisone taper for possible asthmatic exacerbation. Patient was brought in by EMS on 6/2 after being found unresponsive outside of [employer]ird[employer]ers.  Per EMS report she was initially apneic and pulseless she underwent 5 to 7 minutes of ACLS prior to return of spontaneous circulation she was intubated on the scene, after the fact EMS was unsure about whether or not she was truly pulseless.  Apparently following intubation she was agitated and combative requiring Versed.  Initial arterial blood gas pH 7.26, pCO2 70 pO2 534 HCO3 31.  White blood cell count 13.6, lactate 2.6.  Respiratory virus panel negative.  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 Might start weaning versed  6/6 MRI brain. Off cont sedation. Findings on MRI concerning for some degree of anoxic injury (restricted diffusion involving basal ganglia)  6/10 palliative care meeting, now  DNR   Interim History / Subjective:   No issues overnight.  Unresponsive off sedation.  Objective   Blood pressure 127/76, pulse 89, temperature 100 F (37.8 C), temperature source Axillary, resp. rate (!) 21, height 5\' 1"  (1.549 m), weight (!) 151.7 kg, SpO2 100 %.    Vent Mode: PRVC FiO2 (%):  [40 %] 40 % Set Rate:  [20 bmp] 20 bmp Vt Set:  [380 mL] 380 mL PEEP:  [5 cmH20] 5 cmH20 Pressure Support:  [10 cmH20] 10 cmH20 Plateau Pressure:  [14 cmH20-22 cmH20] 14 cmH20   Intake/Output Summary (Last 24 hours) at 03/10/2023 0756 Last data filed at 03/10/2023 0700 Gross per 24 hour  Intake 1833.1 ml  Output 1805 ml  Net 28.1 ml   Filed Weights   03/08/23 0411 03/09/23 0422 03/10/23 0409  Weight: (!) 154.5 kg (!) 154.2 kg (!) 151.7 kg    Gen: Obese female intubated critically ill mechanical life support unresponsive off sedation HEENT: Pupils reactive, NCAT, endotracheal tube in place Neuro: Occasional myoclonus when sedation is lightened Heart: Regular rate rhythm S1-S2 Lungs: Bilateral mechanically ventilated breath sounds Abd: Obese soft nontender nondistended   Resolved Hospital Problem list     Assessment & Plan:   OOH PEA arrest Anoxic encephalopathy Anoxic myoclonus AoC hypoxic and hypercarbic resp failure AECOPD  OSA/OHS  Decompensated diastolic HF  Leukocytosis  Suspected ileus GOC discussion  P: Patient remains on full adult mechanical vent support Continue to wean ventilator as tolerated, liberation from the ventilator is not possible at this time due to mental status. Completed course of antibiotics Continue VAP prophylaxis and PAD guidelines sedation with PRNs if needed to help control myoclonus Continue Keppra She has  had no meaningful neurologic recovery Her prognosis is poor. Agree with ongoing discussions with family.  Would consider comfort care transition once family has made final decisions. I agree with her DNR.   Best Practice (right  click and "Reselect all SmartList Selections" daily)   Diet/type: tubefeeds DVT prophylaxis: prophylactic heparin  GI prophylaxis: PPI Lines: N/A Foley:  Yes, and it is still needed Code Status:  DNR Last date of multidisciplinary goals of care discussion [on going discussions with the patents family regarding GOC]  This patient is critically ill with multiple organ system failure; which, requires frequent high complexity decision making, assessment, support, evaluation, and titration of therapies. This was completed through the application of advanced monitoring technologies and extensive interpretation of multiple databases. During this encounter critical care time was devoted to patient care services described in this note for 31 minutes.   [name], DO [facility] 03/10/2023 7:56 AM

## 2023-03-11 DIAGNOSIS — I469 Cardiac arrest, cause unspecified: Secondary | ICD-10-CM | POA: Diagnosis not present

## 2023-03-11 LAB — CULTURE, BLOOD (ROUTINE X 2): Special Requests: ADEQUATE

## 2023-03-11 LAB — GLUCOSE, CAPILLARY
Glucose-Capillary: 125 mg/dL — ABNORMAL HIGH (ref 70–99)
Glucose-Capillary: 126 mg/dL — ABNORMAL HIGH (ref 70–99)
Glucose-Capillary: 130 mg/dL — ABNORMAL HIGH (ref 70–99)
Glucose-Capillary: 134 mg/dL — ABNORMAL HIGH (ref 70–99)
Glucose-Capillary: 140 mg/dL — ABNORMAL HIGH (ref 70–99)
Glucose-Capillary: 147 mg/dL — ABNORMAL HIGH (ref 70–99)

## 2023-03-11 LAB — BRAIN NATRIURETIC PEPTIDE: B Natriuretic Peptide: 10.7 pg/mL (ref 0.0–100.0)

## 2023-03-11 NOTE — Progress Notes (Signed)
   Palliative Medicine Inpatient Follow Up Note HPI: This is a 47 year old female patient With known history of super obesity, asthma/COPD overlap, complex apnea both central and obstructive. Suffered a PEA arrest with 5-7 minutes of resuscitation efforts ROSC was achieved and  has been hospitalized since this event. Palliative care has been asked to get involved for further goals of care conversations given poor neurological prognosis.   Today's Discussion 03/11/2023  *Please note that this is a verbal dictation therefore any spelling or grammatical errors are due to the "Dragon Medical One" system interpretation.  Chart reviewed inclusive of vital signs, progress notes, laboratory results, and diagnostic images.   I assessed April Ellis this afternoon. She was not responsive to me when asked to follow commands. She was quite somnolent throughout the whole time I was present at bedside. She had notable mycolic jerks incrementally.   Per conversations(s) with patients sisters the decision as a family has been made to proceed with comfort focused care on Monday after allowing the weekend for family and friends to visit. Patients son, [name] is in agreement with this plan. This is likely to take place later in the afternoon however patients sister, [name] will confirm this and provide a more definitive time.   In regards to comfort care. I was able to explain to patients sisters what this path looks like in terms of extubation, initiation of comfort focused medications, and the natural process of death and dying.    Questions and concerns addressed/Palliative Support Provided.   Objective Assessment: Vital Signs Vitals:   03/11/23 1200 03/11/23 1215  BP: 121/65   Pulse: (!) 107 (!) 105  Resp: 16 (!) 6  Temp:    SpO2: 96% 96%    Intake/Output Summary (Last 24 hours) at 03/11/2023 1335 Last data filed at 03/11/2023 1253 Gross per 24 hour  Intake 1420 ml  Output 1370 ml  Net 50 ml    Last  Weight  Most recent update: 03/11/2023  5:56 AM    Weight  154 kg (339 lb 8.1 oz)              Gen: Middle-aged African-American female acutely ill in appearance HEENT: ETT, dry mucous membranes CV: Regular rate and rhythm PULM: On mechanical ventilatory support ABD: soft/nontender EXT: Generalized edema Neuro: Unarousable  SUMMARY OF RECOMMENDATIONS   DNAR  Allow the weekend for family and friends to visit --> Transition to comfort care on Monday with liberation from ventilatory support  PMT will continue to provide holistic support  Billing based on MDM: High ______________________________________________________________________________________ April Ellis Palliative Medicine Team Team Cell Phone: [phone] Please utilize secure chat with additional questions, if there is no response within 30 minutes please call the above phone number  Palliative Medicine Team providers are available by phone from 7am to 7pm daily and can be reached through the team cell phone.  Should this patient require assistance outside of these hours, please call the patient's attending physician.

## 2023-03-11 NOTE — Progress Notes (Signed)
TF held d/t 2 episodes of emesis, stomach distention. Will reassess in a few hours for readiness to resume TF.

## 2023-03-11 NOTE — Progress Notes (Signed)
NAME:  April Ellis, MRN:  [id number], DOB:  01-Apr-1976, LOS: 12 ADMISSION DATE:  03/27/2023, CONSULTATION DATE: 6/2 REFERRING MD: Doran Durand, CHIEF COMPLAINT: Cardiac arrest  History of Present Illness:  This is a 47 year old female patient With known history of super obesity, asthma/COPD overlap, complex apnea both central and obstructive followed by Dr. Maple Hudson.  She was actually seen in our office on 5/3 at which time she was counseled to stop smoking, and was given a prednisone taper for possible asthmatic exacerbation. Patient was brought in by EMS on 6/2 after being found unresponsive outside of haird[employer]ers.  Per EMS report she was initially apneic and pulseless she underwent 5 to 7 minutes of ACLS prior to return of spontaneous circulation she was intubated on the scene, after the fact EMS was unsure about whether or not she was truly pulseless.  Apparently following intubation she was agitated and combative requiring Versed.  Initial arterial blood gas pH 7.26, pCO2 70 pO2 534 HCO3 31.  White blood cell count 13.6, lactate 2.6.  Respiratory virus panel negative.  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 Might start weaning versed  6/6 MRI brain. Off cont sedation. Findings on MRI concerning for some degree of anoxic injury (restricted diffusion involving basal ganglia)  6/10 palliative care meeting, now  DNR  03/11/2023 ongoing discussions with family.  I spoke with patient's sister today via phone.  They are in agreement for consideration to transition to comfort care potentially this weekend or Monday at the latest.  Interim History / Subjective:   No issues overnight.  Remains unresponsive off sedation.  Objective   Blood pressure 122/75, pulse 99, temperature 98.9 F (37.2 C), temperature source Oral, resp. rate (!) 23, height 5\' 1"  (1.549 m), weight (!) 154 kg, SpO2 96 %.    Vent Mode: PSV;CPAP FiO2 (%):  [40 %] 40 % Set Rate:  [20 bmp] 20 bmp Vt Set:  [380 mL] 380 mL PEEP:  [5 cmH20] 5 cmH20 Pressure Support:  [10 cmH20] 10 cmH20 Plateau Pressure:  [10 cmH20-13 cmH20] 10 cmH20   Intake/Output Summary (Last 24 hours) at 03/11/2023 0915 Last data filed at 03/11/2023 0600 Gross per 24 hour  Intake 1420 ml  Output 1220 ml  Net 200 ml   Filed Weights   03/09/23 0422 03/10/23 0409 03/11/23 0500  Weight: (!) 154.2 kg (!) 151.7 kg (!) 154 kg    Gen: Obese female intubated critically ill on mechanical support unresponsive off sedation HEENT: Pupils reactive, NCAT, endotracheal tube in place Neuro: Occasional myoclonic has, does grimace to pain Heart: Regular rate rhythm, S1-S2 Lungs: Bilateral mechanically ventilated breath sounds Abd: Obese, soft nontender nondistended   Resolved Hospital Problem list     Assessment & Plan:   OOH PEA arrest Anoxic encephalopathy Anoxic myoclonus, this has dissipated some AoC hypoxic and hypercarbic resp failure AECOPD  OSA/OHS  Decompensated diastolic HF  Leukocytosis  Suspected ileus GOC discussion  P: Patient remains on full adult mechanical vent support, will continue to  wean as tolerated However mental status at this time precludes liberation from the ventilator Family would not want prolonged mechanical vent support Completed course of antibiotics, VAP prophylaxis Continue Keppra Patient has not had any meaningful  neurologic recovery has now been on ventilator for 11 days. Overall I do fear that her prognosis is poor.  I called and spoke with the patient's sister they are in agreement that they would like to move forward with comfort care transition potentially this weekend or as latest on Monday. The son who is helping make these decisions is having difficulty.  However they met as a family yesterday evening to discuss next steps.  I have reached out to the palliative care team to inform them of the conversation that I have had this morning.   Best Practice (right click and "Reselect all SmartList Selections" daily)   Diet/type: tubefeeds DVT prophylaxis: prophylactic heparin  GI prophylaxis: PPI Lines: N/A Foley:  Yes, and it is still needed Code Status:  DNR Last date of multidisciplinary goals of care discussion [on going discussions with the patents family regarding GOC]  This patient is critically ill with multiple organ system failure; which, requires frequent high complexity decision making, assessment, support, evaluation, and titration of therapies. This was completed through the application of advanced monitoring technologies and extensive interpretation of multiple databases. During this encounter critical care time was devoted to patient care services described in this note for 31 minutes.   April Igo, DO [facility] 03/11/2023 9:15 AM

## 2023-03-12 DIAGNOSIS — I469 Cardiac arrest, cause unspecified: Secondary | ICD-10-CM | POA: Diagnosis not present

## 2023-03-12 LAB — GLUCOSE, CAPILLARY
Glucose-Capillary: 136 mg/dL — ABNORMAL HIGH (ref 70–99)
Glucose-Capillary: 132 mg/dL — ABNORMAL HIGH (ref 70–99)
Glucose-Capillary: 136 mg/dL — ABNORMAL HIGH (ref 70–99)
Glucose-Capillary: 125 mg/dL — ABNORMAL HIGH (ref 70–99)
Glucose-Capillary: 141 mg/dL — ABNORMAL HIGH (ref 70–99)

## 2023-03-12 LAB — TRIGLYCERIDES: Triglycerides: 166 mg/dL — ABNORMAL HIGH (ref <150)

## 2023-03-12 LAB — BRAIN NATRIURETIC PEPTIDE: B Natriuretic Peptide: 73.8 pg/mL (ref 0.0–100.0)

## 2023-03-12 NOTE — Progress Notes (Signed)
NAME:  JONIAH NAMETH, MRN:  [id number], DOB:  [birth year], LOS: 13 ADMISSION DATE:  03/03/2023, CONSULTATION DATE: 6/2 REFERRING MD: Doran Durand, CHIEF COMPLAINT: Cardiac arrest  History of Present Illness:  This is a 47 year old female patient With known history of super obesity, asthma/COPD overlap, complex apnea both central and obstructive followed by Dr. Maple Hudson.  She was actually seen in our office on 5/3 at which time she was counseled to stop smoking, and was given a prednisone taper for possible asthmatic exacerbation. Patient was brought in by EMS on 6/2 after being found unresponsive outside of hairdressers.  Per EMS report she was initially apneic and pulseless she underwent 5 to 7 minutes of ACLS prior to return of spontaneous circulation she was intubated on the scene, after the fact EMS was unsure about whether or not she was truly pulseless.  Apparently following intubation she was agitated and combative requiring Versed.  Initial arterial blood gas pH 7.26, pCO2 70 pO2 534 HCO3 31.  White blood cell count 13.6, lactate 2.6.  Respiratory virus panel negative.  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 Might start weaning versed  6/6 MRI brain. Off cont sedation. Findings on MRI concerning for some degree of anoxic injury (restricted diffusion involving basal ganglia)  6/10 palliative care meeting, now  DNR  03/11/2023 ongoing discussions with family.  I spoke with patient's sister today via phone.  They are in agreement for consideration to transition to comfort care potentially this weekend or Monday at the latest.  Interim History / Subjective:   No issues overnight.  Remains unresponsive off sedation.  Objective   Blood pressure 126/79, pulse (!) 103, temperature 98.6 F (37 C), temperature source Oral, resp. rate 20, height 5\' 1"  (1.549 m), weight (!) 153.5 kg, SpO2 96 %.    Vent Mode: PRVC FiO2 (%):  [40 %-60 %] 50 % Set Rate:  [20 bmp] 20 bmp Vt Set:  [380 mL] 380 mL PEEP:  [5 cmH20] 5 cmH20 Pressure Support:  [10 cmH20] 10 cmH20 Plateau Pressure:  [19 cmH20-20 cmH20] 19 cmH20   Intake/Output Summary (Last 24 hours) at 03/12/2023 4098 Last data filed at 03/12/2023 0800 Gross per 24 hour  Intake 1552.1 ml  Output 1660 ml  Net -107.9 ml    Filed Weights   03/10/23 0409 03/11/23 0500 03/12/23 0500  Weight: (!) 151.7 kg (!) 154 kg (!) 153.5 kg    Gen: Obese female intubated critically ill on mechanical support unresponsive off sedation HEENT: Pupils reactive, NCAT, endotracheal tube in place Neuro: Occasional myoclonic eye flickering.  No response to painful stimulation. Heart: Regular rate rhythm, S1-S2 Lungs: Bilateral mechanically ventilated breath sounds Abd: Obese, soft nontender nondistended   Ancillary Testing Personally Reviewed.   No new results.  Assessment & Plan:   OOH PEA arrest Anoxic encephalopathy Anoxic myoclonus, this has dissipated some AoC hypoxic and hypercarbic resp failure AECOPD  OSA/OHS  Decompensated diastolic HF  Leukocytosis  Suspected ileus GOC discussion  P: Patient remains on full  adult mechanical vent support, will continue to wean as tolerated However mental status at this time precludes liberation from the ventilator Family would not want prolonged mechanical vent support Completed course of antibiotics, VAP  prophylaxis Continue Keppra Patient has not had any meaningful neurologic recovery has now been on ventilator for 11 days. Failure to awaken by the 7-day mark portends dismal prognosis. Palliative care is following.  Plan to transition to comfort care over the weekend.   Best Practice (right click and "Reselect all SmartList Selections" daily)   CRITICAL CARE Performed by: Lynnell Catalan   Total critical care time: 35 minutes  Critical care time was exclusive of separately billable procedures and treating other patients.  Critical care was necessary to treat or prevent imminent or life-threatening deterioration.  Critical care was time spent personally by me on the following activities: development of treatment plan with patient and/or surrogate as well as nursing, discussions with consultants, evaluation of patient's response to treatment, examination of patient, obtaining history from patient or surrogate, ordering and performing treatments and interventions, ordering and review of laboratory studies, ordering and review of radiographic studies, pulse oximetry, re-evaluation of patient's condition and participation in multidisciplinary rounds.  Lynnell Catalan, MD [facility] ICU Physician [facility]  Pager: [phone] Mobile: [phone] After hours: [phone].  03/12/2023 8:23 AM

## 2023-03-12 NOTE — Progress Notes (Signed)
   Palliative Medicine Inpatient Follow Up Note HPI: This is a 47 year old female patient With known history of super obesity, asthma/COPD overlap, complex apnea both central and obstructive. Suffered a PEA arrest with 5-7 minutes of resuscitation efforts ROSC was achieved and  has been hospitalized since this event. Palliative care has been asked to get involved for further goals of care conversations given poor neurological prognosis.   Today's Discussion 03/12/2023  *Please note that this is a verbal dictation therefore any spelling or grammatical errors are due to the "Dragon Medical One" system interpretation.  Chart reviewed inclusive of vital signs, progress notes, laboratory results, and diagnostic images.   I assessed April Ellis this  early morning and spoke to her RN, [place]. As of presently April Ellis continues to have intermittent myoclonus, her pupils are sluggish in response to light. She remains off sedation and is not making meaningful movement or following direction at this time.   Plan as of presently is to allow the weekend for friends and family to visit. On Monday comfort care will be instituted.  I have called patients sister April Ellis and verified that Monday the focus will transition to comfort care. She is waiting to speak to her sister to determine an exact time though does plan to update the team once able.   Questions and concerns addressed/Palliative Support Provided.   Objective Assessment: Vital Signs Vitals:   03/12/23 0813 03/12/23 0814  BP:    Pulse:    Resp:    Temp:    SpO2: 97% 96%    Intake/Output Summary (Last 24 hours) at 03/12/2023 0910 Last data filed at 03/12/2023 0800 Gross per 24 hour  Intake 1552.1 ml  Output 1660 ml  Net -107.9 ml    Last Weight  Most recent update: 03/12/2023  5:26 AM    Weight  153.5 kg (338 lb 6.5 oz)              Gen: Middle-aged African-American female acutely ill in appearance HEENT: ETT, dry mucous membranes CV:  Regular rate and rhythm PULM: On mechanical ventilatory support ABD: soft/nontender EXT: Generalized edema Neuro: Unarousable  SUMMARY OF RECOMMENDATIONS   DNAR  Allow the weekend for family and friends to visit --> Transition to comfort care on Monday with liberation from ventilatory support  PMT will continue to provide holistic support  Billing based on MDM: High ______________________________________________________________________________________ Lamarr Lulas [place] Palliative Medicine Team Team Cell Phone: [phone] Please utilize secure chat with additional questions, if there is no response within 30 minutes please call the above phone number  Palliative Medicine Team providers are available by phone from 7am to 7pm daily and can be reached through the team cell phone.  Should this patient require assistance outside of these hours, please call the patient's attending physician.

## 2023-03-13 DIAGNOSIS — I469 Cardiac arrest, cause unspecified: Secondary | ICD-10-CM | POA: Diagnosis not present

## 2023-03-13 LAB — GLUCOSE, CAPILLARY
Glucose-Capillary: 131 mg/dL — ABNORMAL HIGH (ref 70–99)
Glucose-Capillary: 133 mg/dL — ABNORMAL HIGH (ref 70–99)
Glucose-Capillary: 133 mg/dL — ABNORMAL HIGH (ref 70–99)
Glucose-Capillary: 142 mg/dL — ABNORMAL HIGH (ref 70–99)
Glucose-Capillary: 143 mg/dL — ABNORMAL HIGH (ref 70–99)
Glucose-Capillary: 145 mg/dL — ABNORMAL HIGH (ref 70–99)
Glucose-Capillary: 149 mg/dL — ABNORMAL HIGH (ref 70–99)
Glucose-Capillary: 159 mg/dL — ABNORMAL HIGH (ref 70–99)

## 2023-03-13 LAB — CBC
HCT: 42.1 % (ref 36.0–46.0)
Hemoglobin: 13.6 g/dL (ref 12.0–15.0)
MCH: 30.2 pg (ref 26.0–34.0)
MCHC: 32.3 g/dL (ref 30.0–36.0)
MCV: 93.3 fL (ref 80.0–100.0)
Platelets: 266 10*3/uL (ref 150–400)
RBC: 4.51 MIL/uL (ref 3.87–5.11)
RDW: 12.3 % (ref 11.5–15.5)
WBC: 18.5 10*3/uL — ABNORMAL HIGH (ref 4.0–10.5)
nRBC: 0 % (ref 0.0–0.2)

## 2023-03-13 LAB — BASIC METABOLIC PANEL
Anion gap: 10 (ref 5–15)
BUN: 23 mg/dL — ABNORMAL HIGH (ref 6–20)
CO2: 26 mmol/L (ref 22–32)
Calcium: 9.3 mg/dL (ref 8.9–10.3)
Chloride: 100 mmol/L (ref 98–111)
Creatinine, Ser: 0.47 mg/dL (ref 0.44–1.00)
GFR, Estimated: >60 mL/min (ref >60)
Glucose, Bld: 146 mg/dL — ABNORMAL HIGH (ref 70–99)
Potassium: 4 mmol/L (ref 3.5–5.1)
Sodium: 136 mmol/L (ref 135–145)

## 2023-03-13 LAB — BRAIN NATRIURETIC PEPTIDE: B Natriuretic Peptide: 75.5 pg/mL (ref 0.0–100.0)

## 2023-03-13 LAB — MAGNESIUM: Magnesium: 1.9 mg/dL (ref 1.7–2.4)

## 2023-03-13 MED ORDER — MIDAZOLAM-SODIUM CHLORIDE 100-0.9 MG/100ML-% IV SOLN
0.5000 mg/h | INTRAVENOUS | Status: DC
  Administered 2023-03-13: 0.5 mg/h via INTRAVENOUS
  Administered 2023-03-14: 4 mg/h via INTRAVENOUS
  Filled 2023-03-13 (×2): qty 100

## 2023-03-13 MED ORDER — SODIUM CHLORIDE 0.9 % IV BOLUS
500.0000 mL | Freq: Once | INTRAVENOUS | Status: AC
  Administered 2023-03-13: 500 mL via INTRAVENOUS

## 2023-03-13 MED ORDER — MAGNESIUM SULFATE 2 GM/50ML IV SOLN
2.0000 g | Freq: Once | INTRAVENOUS | Status: AC
  Administered 2023-03-14: 2 g via INTRAVENOUS
  Filled 2023-03-13: qty 50

## 2023-03-13 MED ORDER — SODIUM CHLORIDE 0.9 % IV SOLN: INTRAVENOUS | Status: DC

## 2023-03-13 MED ORDER — MIDAZOLAM BOLUS VIA INFUSION
2.0000 mg | INTRAVENOUS | Status: DC | PRN
  Administered 2023-03-13: 2 mg via INTRAVENOUS

## 2023-03-13 NOTE — Progress Notes (Addendum)
   Palliative Medicine Inpatient Follow Up Note HPI: This is a 47 year old female patient With known history of super obesity, asthma/COPD overlap, complex apnea both central and obstructive. Suffered a PEA arrest with 5-7 minutes of resuscitation efforts ROSC was achieved and  has been hospitalized since this event. Palliative care has been asked to get involved for further goals of care conversations given poor neurological prognosis.   Today's Discussion 03/13/2023  *Please note that this is a verbal dictation therefore any spelling or grammatical errors are due to the "[employer]" system interpretation.  Chart reviewed inclusive of vital signs, progress notes, laboratory results, and diagnostic images.   I assessed April Ellis this  early morning. Per her evening RN there have been no notable changes. April Ellis has not been making meaningful movement or following direction. She continues to have myoclonus.   Upon my assessment, April Ellis does appear generally uncomfortable with deep stimulation though otherwise is somnolent.   I called patients sister, April Ellis. Friends and family will be visiting today. She has still not yet determined a time to assume comfort care tomorrow.   Questions and concerns addressed/Palliative Support Provided.  _______________________ Addendum:  I spoke to patients sister, April Ellis who has shared she and her family would like to pursue compassionate extubation tomorrow at 1PM.  Objective Assessment: Vital Signs Vitals:   03/13/23 0756 03/13/23 0757  BP:  119/71  Pulse:  (!) 110  Resp:  (!) 23  Temp:    SpO2: 95% 100%    Intake/Output Summary (Last 24 hours) at 03/13/2023 0837 Last data filed at 03/13/2023 0640 Gross per 24 hour  Intake 1252.04 ml  Output 1360 ml  Net -107.96 ml    Last Weight  Most recent update: 03/13/2023  4:33 AM    Weight  151.8 kg (334 lb 10.5 oz)              Gen: Middle-aged African-American female acutely ill in  appearance HEENT: ETT, dry mucous membranes CV: Regular rate and rhythm PULM: On mechanical ventilatory support ABD: soft/nontender EXT: Generalized edema Neuro: Unarousable  SUMMARY OF RECOMMENDATIONS   DNAR  Allow the weekend for family and friends to visit --> Transition to comfort care on Monday at 1PM with liberation from ventilatory support  PMT will continue to provide holistic support --> I will not be present tomorrow though my colleague, Sarina Ser will support family during this difficult time  Billing based on MDM: High ______________________________________________________________________________________ Lamarr Lulas [facility] Team Team Cell Phone: [phone] Please utilize secure chat with additional questions, if there is no response within 30 minutes please call the above phone number  Palliative Medicine Team providers are available by phone from 7am to 7pm daily and can be reached through the team cell phone.  Should this patient require assistance outside of these hours, please call the patient's attending physician.

## 2023-03-13 NOTE — Progress Notes (Addendum)
NAME:  April Ellis, MRN:  [id number], DOB:  [birth year], LOS: 14 ADMISSION DATE:  02/26/2023, CONSULTATION DATE: 6/2 REFERRING MD: Doran Durand, CHIEF COMPLAINT: Cardiac arrest  History of Present Illness:  This is a 47 year old female patient With known history of super obesity, asthma/COPD overlap, complex apnea both central and obstructive followed by Dr. Maple Hudson.  She was actually seen in our office on 5/3 at which time she was counseled to stop smoking, and was given a prednisone taper for possible asthmatic exacerbation. Patient was brought in by EMS on 6/2 after being found unresponsive outside of [employer].  Per EMS report she was initially apneic and pulseless she underwent 5 to 7 minutes of ACLS prior to return of spontaneous circulation she was intubated on the scene, after the fact EMS was unsure about whether or not she was truly pulseless.  Apparently following intubation she was agitated and combative requiring Versed.  Initial arterial blood gas pH 7.26, pCO2 70 pO2 534 HCO3 31.  White blood cell count 13.6, lactate 2.6.  Respiratory virus panel negative.  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 Might start weaning versed  6/6 MRI brain. Off cont sedation. Findings on MRI concerning for some degree of anoxic injury (restricted diffusion involving basal ganglia)  6/10 palliative care meeting, now  DNR  03/11/2023 ongoing discussions with family.  I spoke with patient's sister today via phone.  They are in agreement for consideration to transition to comfort care potentially this weekend or Monday at the latest.  Interim History / Subjective:   No issues overnight.  Remains unresponsive off sedation.  Objective   Blood pressure 136/70, pulse (!) 112, temperature 98.5 F (36.9 C), temperature source Oral, resp. rate (!) 25, height 5\' 1"  (1.549 m), weight (!) 151.8 kg, SpO2 100 %.    Vent Mode: PSV;CPAP FiO2 (%):  [40 %] 40 % Set Rate:  [20 bmp] 20 bmp Vt Set:  [380 mL] 380 mL PEEP:  [5 cmH20] 5 cmH20 Pressure Support:  [10 cmH20] 10 cmH20 Plateau Pressure:  [18 cmH20] 18 cmH20   Intake/Output Summary (Last 24 hours) at 03/13/2023 1002 Last data filed at 03/13/2023 0800 Gross per 24 hour  Intake 1339.07 ml  Output 1360 ml  Net -20.93 ml    Filed Weights   03/11/23 0500 03/12/23 0500 03/13/23 0430  Weight: (!) 154 kg (!) 153.5 kg (!) 151.8 kg    Gen: Obese female intubated critically ill on mechanical support unresponsive off sedation HEENT: Pupils reactive, NCAT, endotracheal tube in place Neuro: Occasional myoclonic eye flickering.  No response to painful stimulation. Heart: Regular rate rhythm, S1-S2 Lungs: Bilateral mechanically ventilated breath sounds Abd: Obese, soft nontender nondistended  Ancillary Testing Personally Reviewed.   No new results.  Assessment & Plan:   OOH PEA arrest Anoxic encephalopathy  Anoxic myoclonus with some improvement.  AoC hypoxic and hypercarbic resp failure AECOPD  OSA/OHS  Decompensated diastolic HF  Leukocytosis  Suspected ileus GOC discussion  P: Patient remains on full adult  mechanical vent support, will continue to wean as tolerated However mental status at this time precludes liberation from the ventilator Family would not want prolonged mechanical vent support Completed course of antibiotics, VAP  prophylaxis Continue Keppra Patient has not had any meaningful neurologic recovery has now been on ventilator for 11 days. Failure to awaken by the 7-day mark portends dismal prognosis. Palliative care is following.  Plan to transition to comfort care over the weekend.  Best Practice (right click and "Reselect all SmartList Selections" daily)   Diet/type: tubefeeds DVT prophylaxis: prophylactic heparin  GI prophylaxis: PPI Lines: N/A Foley:  N/A Code Status:  full code Last date of multidisciplinary goals of care discussion [pending]  Lynnell Catalan, MD [facility] ICU Physician [facility]  Pager: [phone] Mobile: [phone] After hours: [phone].  03/13/2023 10:02 AM

## 2023-03-13 NOTE — Progress Notes (Signed)
eLink Physician-Brief Progress Note Patient Name: April Ellis DOB: Dec 06, 1975 MRN: [id number]   Date of Service  03/13/2023  HPI/Events of Note  Obese FM, asthma/COPD overlap, OHS, PEA arrest in field.   Magnesium 1.9  eICU Interventions  Magnesium sulfate ordered.   19 - [facility] team would like chest x-ray and would like initiation of antibiotics.  No clear-cut reason for antibiotics at this time-leukocytosis has been persistent for days without evidence of fevers or other infectious signs or symptoms.  Will add on procalcitonin.  Chest radiograph ordered.  Intervention Category Minor Interventions: Electrolytes abnormality - evaluation and management  Demichael Traum 03/13/2023, 11:08 PM

## 2023-03-14 ENCOUNTER — Inpatient Hospital Stay (HOSPITAL_COMMUNITY): Payer: BC Managed Care – PPO

## 2023-03-14 DIAGNOSIS — J9601 Acute respiratory failure with hypoxia: Secondary | ICD-10-CM | POA: Diagnosis not present

## 2023-03-14 DIAGNOSIS — G931 Anoxic brain damage, not elsewhere classified: Secondary | ICD-10-CM | POA: Diagnosis not present

## 2023-03-14 LAB — GLUCOSE, CAPILLARY
Glucose-Capillary: 107 mg/dL — ABNORMAL HIGH (ref 70–99)
Glucose-Capillary: 117 mg/dL — ABNORMAL HIGH (ref 70–99)
Glucose-Capillary: 133 mg/dL — ABNORMAL HIGH (ref 70–99)

## 2023-03-14 LAB — PROCALCITONIN: Procalcitonin: <0.1 ng/mL

## 2023-03-14 MED ORDER — POLYVINYL ALCOHOL 1.4 % OP SOLN: 1.0000 [drp] | Freq: Four times a day (QID) | OPHTHALMIC | Status: DC | PRN

## 2023-03-14 MED ORDER — ACETAMINOPHEN 650 MG RE SUPP: 650.0000 mg | Freq: Four times a day (QID) | RECTAL | Status: DC | PRN

## 2023-03-14 MED ORDER — GLYCOPYRROLATE 0.2 MG/ML IJ SOLN
0.4000 mg | INTRAMUSCULAR | Status: DC | PRN
  Administered 2023-03-14: 0.4 mg via INTRAVENOUS
  Filled 2023-03-14: qty 2

## 2023-03-14 MED ORDER — IPRATROPIUM-ALBUTEROL 0.5-2.5 (3) MG/3ML IN SOLN: 3.0000 mL | RESPIRATORY_TRACT | Status: DC | PRN

## 2023-03-14 MED ORDER — MORPHINE 100MG IN NS 100ML (1MG/ML) PREMIX INFUSION
0.0000 mg/h | INTRAVENOUS | Status: DC
  Administered 2023-03-14: 5 mg/h via INTRAVENOUS
  Filled 2023-03-14: qty 100

## 2023-03-14 MED ORDER — MORPHINE BOLUS VIA INFUSION: 2.0000 mg | INTRAVENOUS | Status: DC | PRN

## 2023-03-28 NOTE — Progress Notes (Signed)
Patient terminally extubated at 1430 with Dorene Grebe, RN, Ian Malkin, and Amil Amen, NP, and Alvis Lemmings, NP with palliative at bedside.  Dr. Denese Killings made aware.  Pt. On a morphine and versed gtt.  Family at bedside.  Family spoke with chaplin and their pastor and was with Armenda until after time of death at 52.  Patient death confirmed by Nelly Laurence, RN and Gaspar Cola, RN by listening for heart sounds for 2 min. Family was very thankful and has information to call patient placement when they decide what funeral home to use.  Patient has been transported to morgue and patient placement notified.

## 2023-03-28 NOTE — Death Summary Note (Signed)
DEATH SUMMARY   Patient Details  Name: April Ellis MRN: [id number] DOB: 11/09/1975  Admission/Discharge Information   Admit Date:  February 28, 2023  Date of Death: Date of Death: 03/15/2023  Time of Death: Time of Death: 1644-03-20  Length of Stay: 03-13-2023  Referring Physician: Arnette Felts, FNP   Reason(s) for Hospitalization  Cardiac arrest  Diagnoses  Preliminary cause of death: Respiratory arrest due to asthma exacerbation Secondary Diagnoses (including complications and co-morbidities):  Active Problems:   Acute respiratory failure (HCC)   Seizure (HCC)   Encephalopathy acute   Anoxic brain damage (HCC)   Cardiac arrest (HCC)   On mechanically assisted ventilation (HCC)   Myoclonic seizure (HCC)   Anoxic encephalopathy (HCC)   Goals of care, counseling/discussion   DNR (do not resuscitate) discussion   Brief Hospital Course (including significant findings, care, treatment, and services provided and events leading to death)  April Ellis is a 47 y.o. year old female who was found unresponsive apneic and pulseless.  Received 7 minutes of ACLS prior to ROSC.  History of morbid obesity asthma COPD overlap with sleep apnea which likely led to arrest. Admitted to hospital on mechanical ventilation.  Developed myoclonic seizures.  MRI performed showing restricted diffusion in the basal ganglia consistent with anoxic injury.  Made DNR following palliative care discussion.  Sedation weaned off but myoclonus recurred without improvement in level of consciousness.  Sedation restarted.  Given no improvement after nearly 14-day observation patient was transition to comfort care and compassionately extubated.  Pertinent Labs and Studies  Significant Diagnostic Studies DG CHEST PORT 1 VIEW  Result Date: March 15, 2023 CLINICAL DATA:  Hypoxia EXAM: PORTABLE CHEST 1 VIEW COMPARISON:  03/04/2023 FINDINGS: Cardiac shadow is stable. Endotracheal tube and gastric catheter are noted in satisfactory position.  Right basilar consolidation is noted. No bony is seen. IMPRESSION: Tubes and lines in satisfactory position. Right basilar consolidation. Electronically Signed   By: Alcide Clever M.D.   On: 03-15-2023 01:17   DG CHEST PORT 1 VIEW  Result Date: 03/04/2023 CLINICAL DATA:  Aspiration pneumonia. EXAM: PORTABLE CHEST 1 VIEW COMPARISON:  Radiograph earlier today, CT 05/01/2022 FINDINGS: Endotracheal tube tip 5.2 cm from the carina. Enteric tube in place with tip below the diaphragm not included in the field of view. There are patchy bibasilar opacities, new on the right and increasing on the left. Additional patchy opacity in the right perihilar region, new. Mild cardiomegaly. Stable mediastinal contours. No pulmonary edema. No pneumothorax. IMPRESSION: 1. Patchy bibasilar opacities, new on the right and increasing on the left. Additional patchy opacity in the right perihilar region. Findings may be atelectasis, pneumonia, or aspiration. 2. Mild cardiomegaly. Electronically Signed   By: Narda Rutherford M.D.   On: 03/04/2023 15:25   DG Chest Port 1 View  Result Date: 03/04/2023 CLINICAL DATA:  Pneumonia. EXAM: PORTABLE CHEST 1 VIEW COMPARISON:  03/01/2023 FINDINGS: The endotracheal tube has tip 4.4 cm above the carina. Enteric tube courses into the region of the stomach and off the film as tip is not visualized. Lungs are adequately inflated with persistent mild hazy density over the left base as cannot exclude a small amount of layering pleural fluid versus atelectasis. Right lung is clear. Stable cardiomegaly. Remainder of the exam is unchanged. IMPRESSION: 1. Persistent mild hazy density over the left base which may represent a small amount of layering pleural fluid/atelectasis versus infection. 2. Stable cardiomegaly. 3. Support apparatus as described. Electronically Signed   By: Elberta Fortis M.D.  On: 03/04/2023 14:59   MR BRAIN WO CONTRAST  Result Date: 03/03/2023 CLINICAL DATA:  Anoxic brain damage EXAM:  MRI HEAD WITHOUT CONTRAST TECHNIQUE: Multiplanar, multiecho pulse sequences of the brain and surrounding structures were obtained without intravenous contrast. COMPARISON:  CT head June 3, 24. FINDINGS: Brain: Age advanced predominately periventricular T2/FLAIR within the white matter. Subtle symmetric FLAIR hyperintensity and possible restricted diffusion involving the basal ganglia. No mass lesion, midline shift or hydrocephalus. Age advanced T2/FLAIR hyperintensities within the white matter, many of which are oriented perpendicular to the long axis of the lateral ventricles near the callososeptal interface. Vascular: Major arterial flow voids are maintained the skull base. Skull and upper cervical spine: Normal marrow signal. Sinuses/Orbits: Negative. IMPRESSION: 1. Subtle symmetric FLAIR hyperintensity and possible restricted diffusion involving the basal ganglia, suspicious for hypoxic/ischemic insult given the provided history. A follow-up MRI could assess for progression/change if clinically warranted. 2. Age advanced predominately periventricular T2/FLAIR within the white matter, primarily concerning for demyelination. Electronically Signed   By: Feliberto Harts M.D.   On: 03/03/2023 11:25   DG Abd 1 View  Result Date: 03/03/2023 CLINICAL DATA:  Preoperative metal screening. EXAM: ABDOMEN - 1 VIEW COMPARISON:  None Available. FINDINGS: Pelvic thermistor. Enteric tube with tip at the stomach. Linear gas over the left abdominal wall which may be related to skin fold. Normal bowel gas pattern. IMPRESSION: 1. Pelvic thermistor. 2. No implanted foreign body to contraindicate MRI. Electronically Signed   By: Tiburcio Pea M.D.   On: 03/03/2023 06:49   DG CHEST PORT 1 VIEW  Result Date: 03/01/2023 CLINICAL DATA:  Hypoxia EXAM: PORTABLE CHEST 1 VIEW COMPARISON:  02/26/2023 FINDINGS: No significant change in AP portable chest radiograph. Cardiomegaly. Unchanged support apparatus including endotracheal tube  and esophagogastric tube, appropriately positioned. Lungs are normally aerated. Osseous structures unremarkable. IMPRESSION: 1.  Cardiomegaly. 2. Unchanged support apparatus including endotracheal tube and esophagogastric tube, appropriately positioned. 3.  No acute airspace opacity. Electronically Signed   By: Jearld Lesch M.D.   On: 03/01/2023 14:58   Overnight EEG with video  Result Date: 03/01/2023 Charlsie Quest, MD     03/02/2023  9:53 AM Patient Name: April Ellis MRN: [id number] Epilepsy Attending: Charlsie Quest Referring Physician/Provider: Erick Blinks, MD Duration: 02/28/2023 1352 to 03/01/2023 1352  Patient history: 47yo F with seizure like activity after cardiac arrest. EEG to evaluate for seizure.  Level of alertness:  comatose  AEDs during EEG study: Propofol, versed, LEV  Technical aspects: This EEG study was done with scalp electrodes positioned according to the 10-20 International system of electrode placement. Electrical activity was reviewed with band pass filter of 1-70Hz , sensitivity of 7 uV/mm, display speed of 41mm/sec with a 60Hz  notched filter applied as appropriate. EEG data were recorded continuously and digitally stored.  Video monitoring was available and reviewed as appropriate.  Description: The, patient was noted to have episodes of brief sudden whole body jerking every few seconds.  Concomitant EEG showed generalized spikes consistent with myoclonic seizures.  In between seizures, EEG showed continuous generalized 2 to 2.5 Hz delta slowing. After around 1500 on 02/28/2023, clinical seizures abated.  EEG then showed continuous generalized 3 to 6 Hz theta-delta slowing.  Gradually around 1800, EEG evolved into burst suppression pattern with bursts of 5 to 7 Hz theta slowing admixed with 15 to 18 Hz beta activity lasting 2 to 3 seconds alternating with 5 to 7 seconds of generalized suppression.  Hyperventilation and photic stimulation were  not performed.    ABNORMALITY -  Myoclonic seizure, generalized - Continuous slow, generalized - Burst suppression, generalized  IMPRESSION: At the beginning of the study, patient was noted to have myoclonic seizures every few seconds. Gradually after around 1500 on 02/28/2023, clinical seizures related.  Subsequently, EEG was suggestive of severe to profound diffuse encephalopathy, likely related to sedation.  Charlsie Quest   EEG adult  Result Date: 02/28/2023 Charlsie Quest, MD     02/28/2023 11:37 AM Patient Name: April Ellis MRN: [id number] Epilepsy Attending: Charlsie Quest Referring Physician/Provider: Gleason, Darcella Gasman, PA-C Date: 02/28/2023 Duration: 22.41 mins Patient history: 47yo F with seizure like activity after cardiac arrest. EEG to evaluate for seizure. Level of alertness:  comatose AEDs during EEG study: Propofol Technical aspects: This EEG study was done with scalp electrodes positioned according to the 10-20 International system of electrode placement. Electrical activity was reviewed with band pass filter of 1-70Hz , sensitivity of 7 uV/mm, display speed of 46mm/sec with a 60Hz  notched filter applied as appropriate. EEG data were recorded continuously and digitally stored.  Video monitoring was available and reviewed as appropriate. Description: Patient was noted to have episodes of brief sudden whole body jerking every few seconds.  Concomitant EEG showed generalized spikes consistent with myoclonic seizures. In between seizures EEG showed continuous generalized 2-2.5Hz  delta slowing. Hyperventilation and photic stimulation were not performed.   ABNORMALITY - Myoclonic seizure, generalized - Continuous slow, generalized IMPRESSION: Patient was noted to have myoclonic seizures every few seconds.  Additionally there was evidence of severe diffuse encephalopathy. Dr. Tonia Brooms was notified. Charlsie Quest   ECHOCARDIOGRAM COMPLETE  Result Date: 02/28/2023    ECHOCARDIOGRAM REPORT   Patient Name:   April Ellis [facility] Date of  Exam: 02/28/2023 Medical Rec #:  [id number]       Height:       61.0 in Accession #:    [id number]      Weight:       373.0 lb Date of Birth:  Sep 21, 1976       BSA:          2.462 m Patient Age:    46 years        BP:           138/72 mmHg Patient Gender: F               HR:           68 bpm. Exam Location:  Inpatient Procedure: 2D Echo, Cardiac Doppler and Color Doppler Indications:    Cardiac arrest I46.9  History:        Patient has prior history of Echocardiogram examinations, most                 recent 06/09/2022. Cardiomyopathy, COPD; Risk Factors:Current                 Smoker and Hypertension.  Sonographer:    Dondra Prader RVT RCS Referring Phys: [id number] PETER E BABCOCK  Sonographer Comments: Technically challenging study due to limited acoustic windows, suboptimal apical window, echo performed with patient supine and on artificial respirator and patient is obese. Image acquisition challenging due to respiratory motion and Image acquisition challenging due to patient body habitus. IMPRESSIONS  1. Left ventricular ejection fraction, by estimation, is 55 to 60%. The left ventricle has normal function. Left ventricular endocardial border not optimally defined to evaluate regional wall motion. Left ventricular diastolic parameters are consistent with Grade I diastolic  dysfunction (impaired relaxation).  2. Right ventricular systolic function is normal. The right ventricular size is normal.  3. Left atrial size was mildly dilated.  4. The mitral valve is normal in structure. Trivial mitral valve regurgitation. No evidence of mitral stenosis.  5. The aortic valve is normal in structure. Aortic valve regurgitation is not visualized. No aortic stenosis is present.  6. Aortic dilatation noted. There is mild dilatation of the ascending aorta, measuring 43 mm.  7. The inferior vena cava is dilated in size with >50% respiratory variability, suggesting right atrial pressure of 8 mmHg.  8. Technically limited echo due to poor  sound wave transmission. LV and RV function appear grossly normal. FINDINGS  Left Ventricle: Left ventricular ejection fraction, by estimation, is 55 to 60%. The left ventricle has normal function. Left ventricular endocardial border not optimally defined to evaluate regional wall motion. The left ventricular internal cavity size was normal in size. There is no left ventricular hypertrophy. Left ventricular diastolic parameters are consistent with Grade I diastolic dysfunction (impaired relaxation). Right Ventricle: The right ventricular size is normal. No increase in right ventricular wall thickness. Right ventricular systolic function is normal. Left Atrium: Left atrial size was mildly dilated. Right Atrium: Right atrial size was normal in size. Pericardium: There is no evidence of pericardial effusion. Mitral Valve: The mitral valve is normal in structure. Trivial mitral valve regurgitation. No evidence of mitral valve stenosis. Tricuspid Valve: The tricuspid valve is normal in structure. Tricuspid valve regurgitation is trivial. No evidence of tricuspid stenosis. Aortic Valve: The aortic valve is normal in structure. Aortic valve regurgitation is not visualized. No aortic stenosis is present. Aortic valve mean gradient measures 5.0 mmHg. Aortic valve peak gradient measures 9.5 mmHg. Aortic valve area, by VTI measures 3.07 cm. Pulmonic Valve: The pulmonic valve was not well visualized. Pulmonic valve regurgitation is not visualized. No evidence of pulmonic stenosis. Aorta: The aortic root is normal in size and structure and aortic dilatation noted. There is mild dilatation of the ascending aorta, measuring 43 mm. Venous: The inferior vena cava is dilated in size with greater than 50% respiratory variability, suggesting right atrial pressure of 8 mmHg. IAS/Shunts: No atrial level shunt detected by color flow Doppler.  LEFT VENTRICLE PLAX 2D LVIDd:         5.50 cm LVIDs:         4.10 cm LV PW:         1.10 cm LV  IVS:        1.10 cm LVOT diam:     2.10 cm LV SV:         97 LV SV Index:   40 LVOT Area:     3.46 cm  RIGHT VENTRICLE            IVC RV Basal diam:  3.50 cm    IVC diam: 2.30 cm RV Mid diam:    3.10 cm RV S prime:     9.08 cm/s TAPSE (M-mode): 3.1 cm LEFT ATRIUM             Index        RIGHT ATRIUM           Index LA diam:        4.10 cm 1.67 cm/m   RA Area:     16.50 cm LA Vol (A2C):   35.6 ml 14.46 ml/m  RA Volume:   46.30 ml  18.81 ml/m LA Vol (A4C):   23.4 ml  9.51 ml/m LA Biplane Vol: 29.7 ml 12.07 ml/m  AORTIC VALVE                     PULMONIC VALVE AV Area (Vmax):    3.08 cm      PV Vmax:       1.03 m/s AV Area (Vmean):   2.99 cm      PV Peak grad:  4.2 mmHg AV Area (VTI):     3.07 cm AV Vmax:           154.00 cm/s AV Vmean:          102.000 cm/s AV VTI:            0.317 m AV Peak Grad:      9.5 mmHg AV Mean Grad:      5.0 mmHg LVOT Vmax:         137.00 cm/s LVOT Vmean:        88.000 cm/s LVOT VTI:          0.281 m LVOT/AV VTI ratio: 0.89  AORTA Ao Root diam: 2.70 cm Ao Asc diam:  4.30 cm MITRAL VALVE MV Area (PHT): 3.22 cm    SHUNTS MV Decel Time: 236 msec    Systemic VTI:  0.28 m MV E velocity: 58.95 cm/s  Systemic Diam: 2.10 cm MV A velocity: 68.70 cm/s MV E/A ratio:  0.86 Arvilla Meres MD Electronically signed by Arvilla Meres MD Signature Date/Time: 02/28/2023/11:29:32 AM    Final    CT HEAD WO CONTRAST ( )  Result Date: 02/28/2023 CLINICAL DATA:  Stroke suspected EXAM: CT HEAD WITHOUT CONTRAST TECHNIQUE: Contiguous axial images were obtained from the base of the skull through the vertex without intravenous contrast. RADIATION DOSE REDUCTION: This exam was performed according to the departmental dose-optimization program which includes automated exposure control, adjustment of the mA and/or kV according to patient size and/or use of iterative reconstruction technique. COMPARISON:  12/13/2020 FINDINGS: Brain: Possible hypodensity in the left cerebellum (series 4, image 9), although  this area is quite prone to artifact. No other findings suggestive of acute infarct. Gray-white differentiation is preserved. The basilar cisterns are patent. No evidence of acute hemorrhage, mass, mass effect, or midline shift. No hydrocephalus or extra-axial fluid collection. Vascular: No hyperdense vessel. Skull: Negative for fracture or focal lesion. Sinuses/Orbits: Mucosal thickening in the ethmoid air cells. No acute finding in the orbits. Other: The mastoids are well aerated. Endotracheal and orogastric tubes noted. IMPRESSION: 1. Possible hypodensity in the left cerebellum, although this area is quite prone to artifact. An MRI could provide further evaluation. 2. No other findings suggestive of acute infarct. 3. No acute intracranial hemorrhage. These results will be called to the ordering clinician or representative by the Radiologist Assistant, and communication documented in the PACS or [employer]. Electronically Signed   By: Wiliam Ke M.D.   On: 02/28/2023 00:59   DG Chest Portable 1 View  Result Date: 03/01/2023 CLINICAL DATA:  Post intubation EXAM: PORTABLE CHEST 1 VIEW COMPARISON:  December 06, 2022 FINDINGS: The ETT terminates 1.4 cm above the carina. The NG tube terminates in the stomach. The cardiomediastinal silhouette is stable. No pneumothorax. No nodules, masses, focal infiltrates, or edema. IMPRESSION: 1. The ETT terminates 1.4 cm above the carina. Recommend withdrawing 1 or 2 cm. 2. The OG tube terminates in the stomach. 3. No other acute abnormalities. 4. No acute cardiopulmonary disease. Electronically Signed   By: Gerome Sam III M.D.  On: 03/10/2023 16:37    Microbiology No results found for this or any previous visit (from the past 240 hour(s)).  Lab Basic Metabolic Panel: Recent Labs  Lab 03/13/23 2142  NA 136  K 4.0  CL 100  CO2 26  GLUCOSE 146*  BUN 23*  CREATININE 0.47  CALCIUM 9.3  MG 1.9   Liver Function Tests: No results for input(s): "AST",  "ALT", "ALKPHOS", "BILITOT", "PROT", "ALBUMIN" in the last 168 hours. No results for input(s): "LIPASE", "AMYLASE" in the last 168 hours. No results for input(s): "AMMONIA" in the last 168 hours. CBC: Recent Labs  Lab 03/13/23 2142  WBC 18.5*  HGB 13.6  HCT 42.1  MCV 93.3  PLT 266   Cardiac Enzymes: No results for input(s): "CKTOTAL", "CKMB", "CKMBINDEX", "TROPONINI" in the last 168 hours. Sepsis Labs: Recent Labs  Lab 03/13/23 2142  PROCALCITON <0.10  WBC 18.5*    Procedures/Operations  Mechanical ventilation EEG monitoring, MRI brain.   Charizma Gardiner 03/16/2023, 11:10 AM

## 2023-03-28 NOTE — Procedures (Signed)
Extubation Procedure Note  Patient Details:   Name: April Ellis DOB: [birth year] MRN: [id number]   Airway Documentation:    Vent end date: 03/05/2023 Vent end time: 1416   Evaluation  O2 sats: stable throughout Complications: No apparent complications Patient did tolerate procedure well. Bilateral Breath Sounds: Clear, Diminished   No Pt was successfully extubated to comfort care measures. RN & PA at bedside. No complications at this time.  Jolayne Panther 03/20/2023, 2:17 PM

## 2023-03-28 NOTE — Progress Notes (Signed)
NAME:  April Ellis, MRN:  [id number], DOB:  Oct 22, 1975, LOS: 15 ADMISSION DATE:  02/28/2023, CONSULTATION DATE: 6/2 REFERRING MD: Doran Durand, CHIEF COMPLAINT: Cardiac arrest  History of Present Illness:  This is a 47 year old female patient With known history of super obesity, asthma/COPD overlap, complex apnea both central and obstructive followed by Dr. Maple Hudson.  She was actually seen in our office on 5/3 at which time she was counseled to stop smoking, and was given a prednisone taper for possible asthmatic exacerbation. Patient was brought in by EMS on 6/2 after being found unresponsive outside of [employer].  Per EMS report she was initially apneic and pulseless she underwent 5 to 7 minutes of ACLS prior to return of spontaneous circulation she was intubated on the scene, after the fact EMS was unsure about whether or not she was truly pulseless.  Apparently following intubation she was agitated and combative requiring Versed.  Initial arterial blood gas pH 7.26, pCO2 70 pO2 534 HCO3 31.  White blood cell count 13.6, lactate 2.6.  Respiratory virus panel negative.  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 Might start weaning versed  6/6 MRI brain. Off cont sedation. Findings on MRI concerning for some degree of anoxic injury (restricted diffusion involving basal ganglia)  6/10 palliative care meeting, now  DNR  03/11/2023 ongoing discussions with family.  I spoke with patient's sister today via phone.  They are in agreement for consideration to transition to comfort care potentially this weekend or Monday at the latest.  Interim History / Subjective:   No changes overnight.  Objective   Blood pressure 126/76, pulse (Abnormal) 109, temperature 98.8 F (37.1 C), temperature source Axillary, resp. rate (Abnormal) 22, height 5\' 1"  (1.549 m), weight (Abnormal) 151.4 kg, SpO2 99 %.    Vent Mode: PSV;CPAP FiO2 (%):  [40 %] 40 % Set Rate:  [20 bmp] 20 bmp Vt Set:  [380 mL] 380 mL PEEP:  [5 cmH20] 5 cmH20 Pressure Support:  [6 cmH20-10 cmH20] 6 cmH20 Plateau Pressure:  [26 cmH20-27 cmH20] 26 cmH20   Intake/Output Summary (Last 24 hours) at 03/18/2023 0916 Last data filed at 02/26/2023 0900 Gross per 24 hour  Intake 3127.75 ml  Output 1160 ml  Net 1967.75 ml   Filed Weights   03/12/23 0500 03/13/23 0430 03/06/2023 0500  Weight: (Abnormal) 153.5 kg (Abnormal) 151.8 kg (Abnormal) 151.4 kg   General this is a morbidly obese 47 year old female patient who remains comatose following prolonged cardiac arrest HEENT normocephalic atraumatic orally intubated Pulmonary: Coarse bilateral breath sounds.  Does have equal chest rise.  Currently on pressure support ventilation Cardiac: Regular rate and rhythm Abdomen: Soft nontender Extremities: Warm dry Neuro: Continues to demonstrate intermittent myoclonus.  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 Plan Cont Keppra Supportive care  AoC hypoxic and hypercarbic resp failure OSA/OHS  Plan Cont full vent support for now VAP bundle   diastolic HF  Plan Tele   Leukocytosis  PCT <  .10 Completed course of abx Plan Monitor   Suspected ileus Plan Supportive care  GOC discussion Plan PCT following Plan is to eventually transition to comfort at 1 pm  Will stop tube feeds  Best Practice (right click and "Reselect all SmartList Selections" daily)   Diet/type: NPO DVT prophylaxis: prophylactic heparin  GI prophylaxis: PPI Lines: N/A Foley:  N/A Code Status:  full code Last date of multidisciplinary goals of care discussion [pending]  My cct 31 min   Simonne Martinet ACNP-BC [facility] Pulmonary/Critical Care Pager # [phone] OR # [phone] if no answer    03/07/2023 9:16 AM

## 2023-03-28 NOTE — Progress Notes (Addendum)
This chaplain responded to PMT-Dawn referral for EOL spiritual care with the Pt. and family  The chaplain received an update from the Pt RN-Natalie. The chaplain introduced herself and prayed with the Pt.   The family is not present at this time. The chaplain is available for F/U spiritual care as needed.  **1327 Family is gathering with the Pt. At the bedside. Community clergy is present along with PMT. This chaplain is available for F/U spiritual care as needed.  Chaplain Stephanie Acre [phone]

## 2023-03-28 NOTE — Progress Notes (Signed)
Nutrition Brief Note  Chart reviewed. Pt now transitioning to comfort care.  No further nutrition interventions planned at this time.  Please re-consult as needed.   Cate Harrold Fitchett MS, RDN, LDN, CNSC Registered Dietitian 3 Clinical Nutrition RD Pager and On-Call Pager Number Located in Amion    

## 2023-03-28 NOTE — Progress Notes (Signed)
                                                                                                                                                                                                          Palliative Medicine Progress Note   Patient Name: April Ellis       Date: 03/12/2023 DOB: December 25, 1975  Age: 47 y.o. MRN#: [id number] Attending Physician: Lynnell Catalan, MD Primary Care Physician: Arnette Felts, FNP Admit Date: 03/17/2023  Reason for Consultation/Follow-up: {Reason for Consult:23484}  HPI/Patient Profile: This is a 47 year old female patient With known history of super obesity, asthma/COPD overlap, complex apnea both central and obstructive. Suffered a PEA arrest with 5-7 minutes of resuscitation efforts ROSC was achieved and has been hospitalized since this event. Palliative care has been asked to get involved for further goals of care conversations given poor neurological prognosis.   Subjective: ***  Objective:  Physical Exam          Vital Signs: BP 124/83 (BP Location: Left Wrist)   Pulse (!) 110   Temp 98.8 F (37.1 C) (Axillary)   Resp (!) 26   Ht 5\' 1"  (1.549 m)   Wt (!) 151.4 kg   SpO2 100%   BMI 63.07 kg/m  SpO2: SpO2: 100 % O2 Device: O2 Device: Ventilator O2 Flow Rate:      Palliative Medicine Assessment & Plan   Assessment: Active Problems:   Acute respiratory failure (HCC)   Seizure (HCC)   Encephalopathy acute   Anoxic brain damage (HCC)   Cardiac arrest (HCC)   On mechanically assisted ventilation (HCC)   Myoclonic seizure (HCC)   Anoxic encephalopathy (HCC)   Goals of care, counseling/discussion   DNR (do not resuscitate) discussion    Recommendations/Plan: Transition to full comfort care with compassionate extubation Morphine infusion to ensure comfort at end-of-life  Code Status: DNR   Prognosis: Minutes - hours  Discharge Planning: Anticipated Hospital Death  Care plan was discussed with ***  Thank you for allowing  the Palliative Medicine Team to assist in the care of this patient.   ***   Merry Proud, NP   Please contact Palliative Medicine Team phone at [phone] for questions and concerns.  For individual providers, please see AMION.

## 2023-03-28 DEATH — deceased

## 2023-03-29 ENCOUNTER — Ambulatory Visit: Payer: BC Managed Care – PPO | Admitting: Nurse Practitioner

## 2023-04-06 ENCOUNTER — Ambulatory Visit: Payer: BC Managed Care – PPO | Admitting: Allergy

## 2023-05-03 ENCOUNTER — Ambulatory Visit: Payer: BC Managed Care – PPO | Admitting: Nurse Practitioner

## 2023-05-03 ENCOUNTER — Encounter (HOSPITAL_COMMUNITY): Payer: BC Managed Care – PPO | Admitting: Cardiology

## 2023-05-04 ENCOUNTER — Other Ambulatory Visit: Payer: Self-pay | Admitting: Nurse Practitioner

## 2023-05-04 DIAGNOSIS — F3289 Other specified depressive episodes: Secondary | ICD-10-CM

## 2023-08-01 ENCOUNTER — Ambulatory Visit: Payer: BC Managed Care – PPO | Admitting: Internal Medicine

## 2023-11-07 ENCOUNTER — Encounter: Payer: BC Managed Care – PPO | Admitting: Nurse Practitioner

## 2024-07-05 NOTE — Progress Notes (Signed)
 This encounter was created in error - please disregard.
